# Patient Record
Sex: Male | Born: 1970 | Race: White | Hispanic: No | Marital: Single | Smoking: Former smoker
Health system: Southern US, Community
[De-identification: ages and names within clinical notes are randomized; demographics above are authoritative.]

## PROBLEM LIST (undated history)

## (undated) ENCOUNTER — Emergency Department (HOSPITAL_COMMUNITY): Admission: EM | Disposition: A | Discharge status: Home / Self Care | Payer: Medicare Other

## (undated) DIAGNOSIS — F411 Generalized anxiety disorder: Secondary | ICD-10-CM

## (undated) DIAGNOSIS — E785 Hyperlipidemia, unspecified: Secondary | ICD-10-CM

## (undated) DIAGNOSIS — Z981 Arthrodesis status: Secondary | ICD-10-CM

## (undated) DIAGNOSIS — M7550 Bursitis of unspecified shoulder: Secondary | ICD-10-CM

## (undated) DIAGNOSIS — R0782 Intercostal pain: Secondary | ICD-10-CM

## (undated) DIAGNOSIS — D492 Neoplasm of unspecified behavior of bone, soft tissue, and skin: Secondary | ICD-10-CM

## (undated) DIAGNOSIS — Z967 Presence of other bone and tendon implants: Secondary | ICD-10-CM

## (undated) DIAGNOSIS — J45909 Unspecified asthma, uncomplicated: Secondary | ICD-10-CM

## (undated) DIAGNOSIS — J309 Allergic rhinitis, unspecified: Secondary | ICD-10-CM

## (undated) DIAGNOSIS — M1990 Unspecified osteoarthritis, unspecified site: Secondary | ICD-10-CM

## (undated) DIAGNOSIS — M5440 Lumbago with sciatica, unspecified side: Secondary | ICD-10-CM

## (undated) DIAGNOSIS — M542 Cervicalgia: Secondary | ICD-10-CM

## (undated) DIAGNOSIS — G5600 Carpal tunnel syndrome, unspecified upper limb: Secondary | ICD-10-CM

## (undated) DIAGNOSIS — G2581 Restless legs syndrome: Secondary | ICD-10-CM

## (undated) DIAGNOSIS — M545 Low back pain, unspecified: Secondary | ICD-10-CM

## (undated) DIAGNOSIS — G894 Chronic pain syndrome: Secondary | ICD-10-CM

## (undated) DIAGNOSIS — K644 Residual hemorrhoidal skin tags: Secondary | ICD-10-CM

## (undated) DIAGNOSIS — R21 Rash and other nonspecific skin eruption: Secondary | ICD-10-CM

## (undated) DIAGNOSIS — J321 Chronic frontal sinusitis: Secondary | ICD-10-CM

## (undated) DIAGNOSIS — R930 Abnormal findings on diagnostic imaging of skull and head, not elsewhere classified: Secondary | ICD-10-CM

## (undated) DIAGNOSIS — R569 Unspecified convulsions: Secondary | ICD-10-CM

## (undated) DIAGNOSIS — M533 Sacrococcygeal disorders, not elsewhere classified: Secondary | ICD-10-CM

## (undated) DIAGNOSIS — G8929 Other chronic pain: Secondary | ICD-10-CM

## (undated) DIAGNOSIS — G4700 Insomnia, unspecified: Secondary | ICD-10-CM

## (undated) DIAGNOSIS — S06369A Traumatic hemorrhage of cerebrum, unspecified, with loss of consciousness of unspecified duration, initial encounter: Secondary | ICD-10-CM

## (undated) DIAGNOSIS — M25519 Pain in unspecified shoulder: Secondary | ICD-10-CM

## (undated) DIAGNOSIS — G5762 Lesion of plantar nerve, left lower limb: Secondary | ICD-10-CM

## (undated) DIAGNOSIS — M8580 Other specified disorders of bone density and structure, unspecified site: Secondary | ICD-10-CM

## (undated) DIAGNOSIS — G43809 Other migraine, not intractable, without status migrainosus: Secondary | ICD-10-CM

## (undated) DIAGNOSIS — K219 Gastro-esophageal reflux disease without esophagitis: Secondary | ICD-10-CM

## (undated) DIAGNOSIS — S8992XA Unspecified injury of left lower leg, initial encounter: Secondary | ICD-10-CM

## (undated) DIAGNOSIS — F419 Anxiety disorder, unspecified: Secondary | ICD-10-CM

## (undated) DIAGNOSIS — G252 Other specified forms of tremor: Secondary | ICD-10-CM

## (undated) DIAGNOSIS — M5432 Sciatica, left side: Secondary | ICD-10-CM

## (undated) DIAGNOSIS — G5622 Lesion of ulnar nerve, left upper limb: Secondary | ICD-10-CM

## (undated) DIAGNOSIS — R51 Headache: Secondary | ICD-10-CM

## (undated) HISTORY — DX: Gastro-esophageal reflux disease without esophagitis: K21.9

## (undated) HISTORY — DX: Other chronic pain: G89.29

## (undated) HISTORY — DX: Allergic rhinitis, unspecified: J30.9

## (undated) HISTORY — DX: Unspecified convulsions: R56.9

## (undated) HISTORY — DX: Chronic frontal sinusitis: J32.1

## (undated) HISTORY — DX: Chronic pain syndrome: G89.4

## (undated) HISTORY — DX: Cervicalgia: M54.2

## (undated) HISTORY — DX: Rash and other nonspecific skin eruption: R21

## (undated) HISTORY — DX: Bursitis of unspecified shoulder: M75.50

## (undated) HISTORY — DX: Other migraine, not intractable, without status migrainosus: G43.809

## (undated) HISTORY — DX: Unspecified injury of left lower leg, initial encounter: S89.92XA

## (undated) HISTORY — DX: Unspecified asthma, uncomplicated: J45.909

## (undated) HISTORY — DX: Restless legs syndrome: G25.81

## (undated) HISTORY — DX: Abnormal findings on diagnostic imaging of skull and head, not elsewhere classified: R93.0

## (undated) HISTORY — PX: TONSILLECTOMY: SUR1361

## (undated) HISTORY — DX: Low back pain, unspecified: M54.50

## (undated) HISTORY — DX: Traumatic hemorrhage of cerebrum, unspecified, with loss of consciousness of unspecified duration, initial encounter: S06.369A

## (undated) HISTORY — DX: Neoplasm of unspecified behavior of bone, soft tissue, and skin: D49.2

## (undated) HISTORY — DX: Lumbago with sciatica, unspecified side: M54.40

## (undated) HISTORY — DX: Residual hemorrhoidal skin tags: K64.4

## (undated) HISTORY — DX: Intercostal pain: R07.82

## (undated) HISTORY — DX: Sciatica, left side: M54.32

## (undated) HISTORY — DX: Lesion of ulnar nerve, left upper limb: G56.22

## (undated) HISTORY — DX: Unspecified osteoarthritis, unspecified site: M19.90

## (undated) HISTORY — PX: SHOULDER ARTHROSCOPY: SHX128

## (undated) HISTORY — DX: Hyperlipidemia, unspecified: E78.5

## (undated) HISTORY — DX: Other specified forms of tremor: G25.2

## (undated) HISTORY — DX: Generalized anxiety disorder: F41.1

## (undated) HISTORY — DX: Insomnia, unspecified: G47.00

## (undated) HISTORY — DX: Headache: R51

## (undated) HISTORY — DX: Carpal tunnel syndrome, unspecified upper limb: G56.00

## (undated) HISTORY — DX: Pain in unspecified shoulder: M25.519

## (undated) HISTORY — DX: Lesion of plantar nerve, left lower limb: G57.62

## (undated) HISTORY — PX: CRANIOTOMY: SHX93

## (undated) HISTORY — DX: Sacrococcygeal disorders, not elsewhere classified: M53.3

## (undated) HISTORY — DX: Low back pain: M54.5

## (undated) HISTORY — DX: Other specified disorders of bone density and structure, unspecified site: M85.80

## (undated) HISTORY — DX: Presence of other bone and tendon implants: Z96.7

## (undated) HISTORY — DX: Arthrodesis status: Z98.1

---
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
## 1995-09-17 HISTORY — PX: NASAL FRACTURE SURGERY: SHX718

## 1998-01-02 ENCOUNTER — Emergency Department (HOSPITAL_COMMUNITY): Admission: EM | Admit: 1998-01-02 | Discharge: 1998-01-02 | Payer: Self-pay | Admitting: Emergency Medicine

## 1998-05-24 ENCOUNTER — Encounter: Admission: RE | Admit: 1998-05-24 | Discharge: 1998-05-24 | Payer: Self-pay | Admitting: Family Medicine

## 1998-08-02 ENCOUNTER — Encounter: Admission: RE | Admit: 1998-08-02 | Discharge: 1998-08-02 | Payer: Self-pay | Admitting: Family Medicine

## 1998-08-29 ENCOUNTER — Encounter: Admission: RE | Admit: 1998-08-29 | Discharge: 1998-08-29 | Payer: Self-pay | Admitting: Sports Medicine

## 1998-09-01 ENCOUNTER — Emergency Department (HOSPITAL_COMMUNITY): Admission: EM | Admit: 1998-09-01 | Discharge: 1998-09-01 | Payer: Self-pay | Admitting: Emergency Medicine

## 1998-09-04 ENCOUNTER — Encounter: Admission: RE | Admit: 1998-09-04 | Discharge: 1998-09-04 | Payer: Self-pay | Admitting: Sports Medicine

## 1998-09-05 ENCOUNTER — Encounter: Admission: RE | Admit: 1998-09-05 | Discharge: 1998-09-05 | Payer: Self-pay | Admitting: Sports Medicine

## 1998-09-07 ENCOUNTER — Encounter: Admission: RE | Admit: 1998-09-07 | Discharge: 1998-09-07 | Payer: Self-pay | Admitting: Sports Medicine

## 1998-09-12 ENCOUNTER — Encounter: Admission: RE | Admit: 1998-09-12 | Discharge: 1998-09-12 | Payer: Self-pay | Admitting: Family Medicine

## 1998-10-04 ENCOUNTER — Ambulatory Visit (HOSPITAL_COMMUNITY): Admission: RE | Admit: 1998-10-04 | Discharge: 1998-10-04 | Payer: Self-pay | Admitting: Neurosurgery

## 1998-10-04 ENCOUNTER — Encounter: Payer: Self-pay | Admitting: Neurosurgery

## 1998-10-05 ENCOUNTER — Encounter: Admission: RE | Admit: 1998-10-05 | Discharge: 1998-10-05 | Payer: Self-pay | Admitting: Family Medicine

## 1998-10-18 ENCOUNTER — Ambulatory Visit (HOSPITAL_COMMUNITY): Admission: RE | Admit: 1998-10-18 | Discharge: 1998-10-18 | Payer: Self-pay | Admitting: Neurosurgery

## 1998-10-18 ENCOUNTER — Encounter: Payer: Self-pay | Admitting: Neurosurgery

## 1998-11-02 ENCOUNTER — Encounter: Payer: Self-pay | Admitting: Neurosurgery

## 1998-11-02 ENCOUNTER — Ambulatory Visit (HOSPITAL_COMMUNITY): Admission: RE | Admit: 1998-11-02 | Discharge: 1998-11-02 | Payer: Self-pay | Admitting: Neurosurgery

## 1998-11-17 ENCOUNTER — Encounter: Admission: RE | Admit: 1998-11-17 | Discharge: 1998-11-28 | Payer: Self-pay | Admitting: Neurosurgery

## 1999-02-12 ENCOUNTER — Encounter: Payer: Self-pay | Admitting: Emergency Medicine

## 1999-02-12 ENCOUNTER — Emergency Department (HOSPITAL_COMMUNITY): Admission: EM | Admit: 1999-02-12 | Discharge: 1999-02-12 | Payer: Self-pay | Admitting: Emergency Medicine

## 1999-02-20 ENCOUNTER — Encounter: Admission: RE | Admit: 1999-02-20 | Discharge: 1999-02-20 | Payer: Self-pay | Admitting: Family Medicine

## 1999-03-06 ENCOUNTER — Encounter: Admission: RE | Admit: 1999-03-06 | Discharge: 1999-03-06 | Payer: Self-pay | Admitting: Family Medicine

## 1999-04-10 ENCOUNTER — Encounter: Admission: RE | Admit: 1999-04-10 | Discharge: 1999-04-10 | Payer: Self-pay | Admitting: Family Medicine

## 1999-04-20 ENCOUNTER — Encounter: Admission: RE | Admit: 1999-04-20 | Discharge: 1999-04-20 | Payer: Self-pay | Admitting: Family Medicine

## 1999-05-08 ENCOUNTER — Encounter: Admission: RE | Admit: 1999-05-08 | Discharge: 1999-05-08 | Payer: Self-pay | Admitting: Family Medicine

## 1999-05-15 ENCOUNTER — Encounter: Admission: RE | Admit: 1999-05-15 | Discharge: 1999-05-15 | Payer: Self-pay | Admitting: Sports Medicine

## 1999-05-22 ENCOUNTER — Encounter: Admission: RE | Admit: 1999-05-22 | Discharge: 1999-05-22 | Payer: Self-pay | Admitting: Family Medicine

## 1999-06-12 ENCOUNTER — Encounter: Admission: RE | Admit: 1999-06-12 | Discharge: 1999-06-12 | Payer: Self-pay | Admitting: Family Medicine

## 1999-06-29 ENCOUNTER — Encounter: Admission: RE | Admit: 1999-06-29 | Discharge: 1999-06-29 | Payer: Self-pay | Admitting: Family Medicine

## 1999-07-05 ENCOUNTER — Encounter: Admission: RE | Admit: 1999-07-05 | Discharge: 1999-10-03 | Payer: Self-pay | Admitting: Family Medicine

## 1999-07-13 ENCOUNTER — Ambulatory Visit (HOSPITAL_COMMUNITY): Admission: RE | Admit: 1999-07-13 | Discharge: 1999-07-13 | Payer: Self-pay | Admitting: Neurology

## 1999-07-30 ENCOUNTER — Ambulatory Visit (HOSPITAL_COMMUNITY): Admission: RE | Admit: 1999-07-30 | Discharge: 1999-07-30 | Payer: Self-pay | Admitting: Neurology

## 1999-07-30 ENCOUNTER — Encounter: Payer: Self-pay | Admitting: Neurology

## 1999-09-17 HISTORY — PX: UVULOPALATOPHARYNGOPLASTY (UPPP)/TONSILLECTOMY/SEPTOPLASTY: SHX6164

## 1999-10-03 ENCOUNTER — Encounter: Admission: RE | Admit: 1999-10-03 | Discharge: 1999-10-03 | Payer: Self-pay | Admitting: Family Medicine

## 1999-10-27 ENCOUNTER — Ambulatory Visit: Admission: RE | Admit: 1999-10-27 | Discharge: 1999-10-27 | Payer: Self-pay | Admitting: Otolaryngology

## 1999-10-31 ENCOUNTER — Encounter: Admission: RE | Admit: 1999-10-31 | Discharge: 1999-10-31 | Payer: Self-pay | Admitting: Family Medicine

## 2000-02-01 ENCOUNTER — Encounter (INDEPENDENT_AMBULATORY_CARE_PROVIDER_SITE_OTHER): Payer: Self-pay | Admitting: Specialist

## 2000-02-01 ENCOUNTER — Inpatient Hospital Stay (HOSPITAL_COMMUNITY): Admission: RE | Admit: 2000-02-01 | Discharge: 2000-02-03 | Payer: Self-pay | Admitting: Otolaryngology

## 2000-03-18 ENCOUNTER — Encounter: Admission: RE | Admit: 2000-03-18 | Discharge: 2000-03-18 | Payer: Self-pay | Admitting: Family Medicine

## 2000-03-21 ENCOUNTER — Encounter: Admission: RE | Admit: 2000-03-21 | Discharge: 2000-03-21 | Payer: Self-pay | Admitting: Family Medicine

## 2000-04-01 ENCOUNTER — Encounter: Admission: RE | Admit: 2000-04-01 | Discharge: 2000-04-01 | Payer: Self-pay | Admitting: Sports Medicine

## 2000-04-22 ENCOUNTER — Encounter: Admission: RE | Admit: 2000-04-22 | Discharge: 2000-04-22 | Payer: Self-pay | Admitting: Family Medicine

## 2000-05-20 ENCOUNTER — Encounter: Admission: RE | Admit: 2000-05-20 | Discharge: 2000-05-20 | Payer: Self-pay | Admitting: Family Medicine

## 2000-05-27 ENCOUNTER — Encounter: Admission: RE | Admit: 2000-05-27 | Discharge: 2000-05-27 | Payer: Self-pay | Admitting: Family Medicine

## 2000-06-03 ENCOUNTER — Encounter: Admission: RE | Admit: 2000-06-03 | Discharge: 2000-06-03 | Payer: Self-pay | Admitting: Family Medicine

## 2000-06-17 ENCOUNTER — Encounter: Admission: RE | Admit: 2000-06-17 | Discharge: 2000-06-17 | Payer: Self-pay | Admitting: Family Medicine

## 2000-07-08 ENCOUNTER — Encounter: Admission: RE | Admit: 2000-07-08 | Discharge: 2000-07-08 | Payer: Self-pay | Admitting: Family Medicine

## 2000-07-16 ENCOUNTER — Encounter: Admission: RE | Admit: 2000-07-16 | Discharge: 2000-07-16 | Payer: Self-pay | Admitting: Family Medicine

## 2000-07-30 ENCOUNTER — Encounter: Admission: RE | Admit: 2000-07-30 | Discharge: 2000-07-30 | Payer: Self-pay | Admitting: Family Medicine

## 2001-02-20 ENCOUNTER — Emergency Department (HOSPITAL_COMMUNITY): Admission: EM | Admit: 2001-02-20 | Discharge: 2001-02-20 | Payer: Self-pay | Admitting: Emergency Medicine

## 2001-02-20 ENCOUNTER — Encounter: Payer: Self-pay | Admitting: Emergency Medicine

## 2001-02-23 ENCOUNTER — Encounter: Admission: RE | Admit: 2001-02-23 | Discharge: 2001-02-23 | Payer: Self-pay | Admitting: *Deleted

## 2001-03-24 ENCOUNTER — Encounter: Admission: RE | Admit: 2001-03-24 | Discharge: 2001-03-24 | Payer: Self-pay | Admitting: Family Medicine

## 2001-05-12 ENCOUNTER — Encounter: Admission: RE | Admit: 2001-05-12 | Discharge: 2001-05-12 | Payer: Self-pay | Admitting: Family Medicine

## 2001-05-24 ENCOUNTER — Emergency Department (HOSPITAL_COMMUNITY): Admission: EM | Admit: 2001-05-24 | Discharge: 2001-05-24 | Payer: Self-pay

## 2001-05-24 ENCOUNTER — Encounter: Payer: Self-pay | Admitting: Emergency Medicine

## 2001-06-23 ENCOUNTER — Encounter: Admission: RE | Admit: 2001-06-23 | Discharge: 2001-06-23 | Payer: Self-pay | Admitting: Family Medicine

## 2002-03-02 ENCOUNTER — Encounter: Admission: RE | Admit: 2002-03-02 | Discharge: 2002-03-02 | Payer: Self-pay | Admitting: Sports Medicine

## 2002-03-02 ENCOUNTER — Encounter: Payer: Self-pay | Admitting: Sports Medicine

## 2002-03-02 ENCOUNTER — Encounter: Admission: RE | Admit: 2002-03-02 | Discharge: 2002-03-02 | Payer: Self-pay | Admitting: Family Medicine

## 2002-03-08 ENCOUNTER — Encounter: Admission: RE | Admit: 2002-03-08 | Discharge: 2002-03-22 | Payer: Self-pay | Admitting: Sports Medicine

## 2002-03-09 ENCOUNTER — Emergency Department (HOSPITAL_COMMUNITY): Admission: EM | Admit: 2002-03-09 | Discharge: 2002-03-09 | Payer: Self-pay | Admitting: Emergency Medicine

## 2002-03-09 ENCOUNTER — Encounter: Payer: Self-pay | Admitting: Emergency Medicine

## 2002-03-10 ENCOUNTER — Encounter: Admission: RE | Admit: 2002-03-10 | Discharge: 2002-03-10 | Payer: Self-pay | Admitting: Family Medicine

## 2002-03-23 ENCOUNTER — Encounter: Admission: RE | Admit: 2002-03-23 | Discharge: 2002-03-23 | Payer: Self-pay | Admitting: Family Medicine

## 2002-03-23 ENCOUNTER — Ambulatory Visit (HOSPITAL_COMMUNITY): Admission: RE | Admit: 2002-03-23 | Discharge: 2002-03-23 | Payer: Self-pay | Admitting: Family Medicine

## 2002-03-30 ENCOUNTER — Encounter: Admission: RE | Admit: 2002-03-30 | Discharge: 2002-03-30 | Payer: Self-pay | Admitting: Family Medicine

## 2002-04-13 ENCOUNTER — Encounter: Admission: RE | Admit: 2002-04-13 | Discharge: 2002-04-13 | Payer: Self-pay | Admitting: Family Medicine

## 2002-04-15 ENCOUNTER — Encounter: Payer: Self-pay | Admitting: Family Medicine

## 2002-04-15 ENCOUNTER — Encounter: Admission: RE | Admit: 2002-04-15 | Discharge: 2002-04-15 | Payer: Self-pay | Admitting: Family Medicine

## 2002-04-27 ENCOUNTER — Encounter: Admission: RE | Admit: 2002-04-27 | Discharge: 2002-04-27 | Payer: Self-pay | Admitting: Family Medicine

## 2002-05-19 ENCOUNTER — Encounter: Admission: RE | Admit: 2002-05-19 | Discharge: 2002-06-15 | Payer: Self-pay

## 2002-06-18 ENCOUNTER — Encounter: Admission: RE | Admit: 2002-06-18 | Discharge: 2002-09-16 | Payer: Self-pay

## 2002-07-23 ENCOUNTER — Encounter: Admission: RE | Admit: 2002-07-23 | Discharge: 2002-07-23 | Payer: Self-pay | Admitting: Family Medicine

## 2002-08-31 ENCOUNTER — Encounter: Admission: RE | Admit: 2002-08-31 | Discharge: 2002-08-31 | Payer: Self-pay | Admitting: Family Medicine

## 2002-09-21 ENCOUNTER — Encounter: Admission: RE | Admit: 2002-09-21 | Discharge: 2002-09-21 | Payer: Self-pay | Admitting: Family Medicine

## 2002-09-21 ENCOUNTER — Encounter: Admission: RE | Admit: 2002-09-21 | Discharge: 2002-12-20 | Payer: Self-pay

## 2002-10-26 ENCOUNTER — Encounter: Admission: RE | Admit: 2002-10-26 | Discharge: 2002-11-08 | Payer: Self-pay | Admitting: Neurosurgery

## 2003-01-14 ENCOUNTER — Encounter: Admission: RE | Admit: 2003-01-14 | Discharge: 2003-04-14 | Payer: Self-pay | Admitting: Family Medicine

## 2003-01-27 ENCOUNTER — Encounter: Payer: Self-pay | Admitting: Physical Medicine & Rehabilitation

## 2003-01-27 ENCOUNTER — Ambulatory Visit (HOSPITAL_COMMUNITY)
Admission: RE | Admit: 2003-01-27 | Discharge: 2003-01-27 | Payer: Self-pay | Admitting: Physical Medicine & Rehabilitation

## 2003-03-07 ENCOUNTER — Encounter: Admission: RE | Admit: 2003-03-07 | Discharge: 2003-03-07 | Payer: Self-pay | Admitting: Family Medicine

## 2003-03-15 ENCOUNTER — Encounter: Admission: RE | Admit: 2003-03-15 | Discharge: 2003-03-15 | Payer: Self-pay | Admitting: Family Medicine

## 2003-04-21 ENCOUNTER — Encounter: Admission: RE | Admit: 2003-04-21 | Discharge: 2003-04-21 | Payer: Self-pay | Admitting: Family Medicine

## 2003-04-26 ENCOUNTER — Encounter: Admission: RE | Admit: 2003-04-26 | Discharge: 2003-04-26 | Payer: Self-pay | Admitting: Family Medicine

## 2003-05-10 ENCOUNTER — Encounter
Admission: RE | Admit: 2003-05-10 | Discharge: 2003-08-08 | Payer: Self-pay | Admitting: Physical Medicine & Rehabilitation

## 2003-07-14 ENCOUNTER — Encounter: Admission: RE | Admit: 2003-07-14 | Discharge: 2003-07-14 | Payer: Self-pay | Admitting: Family Medicine

## 2003-09-21 ENCOUNTER — Encounter: Admission: RE | Admit: 2003-09-21 | Discharge: 2003-12-20 | Payer: Self-pay | Admitting: Family Medicine

## 2003-09-27 ENCOUNTER — Encounter: Admission: RE | Admit: 2003-09-27 | Discharge: 2003-09-27 | Payer: Self-pay | Admitting: Family Medicine

## 2003-10-06 ENCOUNTER — Encounter: Admission: RE | Admit: 2003-10-06 | Discharge: 2003-10-06 | Payer: Self-pay | Admitting: Family Medicine

## 2003-10-18 ENCOUNTER — Ambulatory Visit (HOSPITAL_COMMUNITY)
Admission: RE | Admit: 2003-10-18 | Discharge: 2003-10-18 | Payer: Self-pay | Admitting: Physical Medicine & Rehabilitation

## 2003-10-19 ENCOUNTER — Encounter: Admission: RE | Admit: 2003-10-19 | Discharge: 2003-10-19 | Payer: Self-pay | Admitting: Family Medicine

## 2003-11-08 ENCOUNTER — Encounter: Admission: RE | Admit: 2003-11-08 | Discharge: 2003-11-08 | Payer: Self-pay | Admitting: Family Medicine

## 2003-11-29 ENCOUNTER — Encounter: Admission: RE | Admit: 2003-11-29 | Discharge: 2003-11-29 | Payer: Self-pay | Admitting: Family Medicine

## 2003-12-13 ENCOUNTER — Encounter: Admission: RE | Admit: 2003-12-13 | Discharge: 2003-12-13 | Payer: Self-pay | Admitting: Family Medicine

## 2003-12-27 ENCOUNTER — Encounter: Admission: RE | Admit: 2003-12-27 | Discharge: 2003-12-27 | Payer: Self-pay | Admitting: Family Medicine

## 2004-01-24 ENCOUNTER — Encounter: Admission: RE | Admit: 2004-01-24 | Discharge: 2004-01-24 | Payer: Self-pay | Admitting: Family Medicine

## 2004-02-16 ENCOUNTER — Encounter
Admission: RE | Admit: 2004-02-16 | Discharge: 2004-05-16 | Payer: Self-pay | Admitting: Physical Medicine & Rehabilitation

## 2004-02-28 ENCOUNTER — Encounter: Admission: RE | Admit: 2004-02-28 | Discharge: 2004-02-28 | Payer: Self-pay | Admitting: Family Medicine

## 2004-04-04 ENCOUNTER — Encounter
Admission: RE | Admit: 2004-04-04 | Discharge: 2004-07-03 | Payer: Self-pay | Admitting: Physical Medicine & Rehabilitation

## 2004-04-30 ENCOUNTER — Emergency Department (HOSPITAL_COMMUNITY): Admission: EM | Admit: 2004-04-30 | Discharge: 2004-04-30 | Payer: Self-pay | Admitting: Family Medicine

## 2004-05-03 ENCOUNTER — Emergency Department (HOSPITAL_COMMUNITY): Admission: EM | Admit: 2004-05-03 | Discharge: 2004-05-03 | Payer: Self-pay | Admitting: Emergency Medicine

## 2004-05-07 ENCOUNTER — Encounter: Admission: RE | Admit: 2004-05-07 | Discharge: 2004-05-07 | Payer: Self-pay | Admitting: Family Medicine

## 2004-05-25 ENCOUNTER — Inpatient Hospital Stay (HOSPITAL_COMMUNITY): Admission: AD | Admit: 2004-05-25 | Discharge: 2004-05-27 | Payer: Self-pay | Admitting: Sports Medicine

## 2004-05-25 ENCOUNTER — Ambulatory Visit: Payer: Self-pay | Admitting: Family Medicine

## 2004-05-29 ENCOUNTER — Ambulatory Visit: Payer: Self-pay | Admitting: Family Medicine

## 2004-05-31 ENCOUNTER — Encounter
Admission: RE | Admit: 2004-05-31 | Discharge: 2004-08-29 | Payer: Self-pay | Admitting: Physical Medicine & Rehabilitation

## 2004-06-01 ENCOUNTER — Encounter: Admission: RE | Admit: 2004-06-01 | Discharge: 2004-06-01 | Payer: Self-pay | Admitting: Gastroenterology

## 2004-06-01 ENCOUNTER — Ambulatory Visit: Payer: Self-pay | Admitting: Physical Medicine & Rehabilitation

## 2004-06-05 ENCOUNTER — Ambulatory Visit: Payer: Self-pay | Admitting: Family Medicine

## 2004-06-19 ENCOUNTER — Ambulatory Visit: Payer: Self-pay | Admitting: Family Medicine

## 2004-07-17 ENCOUNTER — Ambulatory Visit: Payer: Self-pay | Admitting: Family Medicine

## 2004-07-18 ENCOUNTER — Ambulatory Visit: Payer: Self-pay | Admitting: Physical Medicine & Rehabilitation

## 2004-08-12 ENCOUNTER — Emergency Department (HOSPITAL_COMMUNITY): Admission: EM | Admit: 2004-08-12 | Discharge: 2004-08-12 | Payer: Self-pay | Admitting: Family Medicine

## 2004-08-28 ENCOUNTER — Ambulatory Visit: Payer: Self-pay | Admitting: Family Medicine

## 2004-10-02 ENCOUNTER — Ambulatory Visit: Payer: Self-pay | Admitting: Family Medicine

## 2004-10-02 ENCOUNTER — Ambulatory Visit (HOSPITAL_COMMUNITY): Admission: RE | Admit: 2004-10-02 | Discharge: 2004-10-02 | Payer: Self-pay | Admitting: Family Medicine

## 2004-10-22 ENCOUNTER — Emergency Department (HOSPITAL_COMMUNITY): Admission: EM | Admit: 2004-10-22 | Discharge: 2004-10-22 | Payer: Self-pay | Admitting: Family Medicine

## 2004-10-31 ENCOUNTER — Ambulatory Visit (HOSPITAL_COMMUNITY): Admission: RE | Admit: 2004-10-31 | Discharge: 2004-10-31 | Payer: Self-pay | Admitting: Sports Medicine

## 2004-10-31 ENCOUNTER — Ambulatory Visit: Payer: Self-pay | Admitting: Sports Medicine

## 2004-11-14 ENCOUNTER — Encounter
Admission: RE | Admit: 2004-11-14 | Discharge: 2005-02-12 | Payer: Self-pay | Admitting: Physical Medicine & Rehabilitation

## 2004-11-16 ENCOUNTER — Ambulatory Visit: Payer: Self-pay | Admitting: Physical Medicine & Rehabilitation

## 2004-12-13 ENCOUNTER — Emergency Department (HOSPITAL_COMMUNITY): Admission: EM | Admit: 2004-12-13 | Discharge: 2004-12-14 | Payer: Self-pay | Admitting: Emergency Medicine

## 2004-12-17 ENCOUNTER — Emergency Department (HOSPITAL_COMMUNITY): Admission: EM | Admit: 2004-12-17 | Discharge: 2004-12-17 | Payer: Self-pay | Admitting: Family Medicine

## 2005-01-01 ENCOUNTER — Ambulatory Visit: Payer: Self-pay

## 2005-01-01 ENCOUNTER — Encounter: Admission: RE | Admit: 2005-01-01 | Discharge: 2005-01-01 | Payer: Self-pay | Admitting: Family Medicine

## 2005-01-15 ENCOUNTER — Ambulatory Visit: Payer: Self-pay | Admitting: Physical Medicine & Rehabilitation

## 2005-01-15 ENCOUNTER — Ambulatory Visit: Payer: Self-pay | Admitting: Family Medicine

## 2005-02-12 ENCOUNTER — Ambulatory Visit: Payer: Self-pay | Admitting: Family Medicine

## 2005-02-13 ENCOUNTER — Encounter
Admission: RE | Admit: 2005-02-13 | Discharge: 2005-05-14 | Payer: Self-pay | Admitting: Physical Medicine & Rehabilitation

## 2005-03-12 ENCOUNTER — Ambulatory Visit: Payer: Self-pay | Admitting: Physical Medicine & Rehabilitation

## 2005-04-12 ENCOUNTER — Ambulatory Visit: Payer: Self-pay | Admitting: Family Medicine

## 2005-04-30 ENCOUNTER — Ambulatory Visit: Payer: Self-pay | Admitting: Family Medicine

## 2005-05-07 ENCOUNTER — Ambulatory Visit: Payer: Self-pay | Admitting: Physical Medicine & Rehabilitation

## 2005-06-04 ENCOUNTER — Encounter
Admission: RE | Admit: 2005-06-04 | Discharge: 2005-09-02 | Payer: Self-pay | Admitting: Physical Medicine & Rehabilitation

## 2005-06-07 ENCOUNTER — Ambulatory Visit: Payer: Self-pay | Admitting: Physical Medicine & Rehabilitation

## 2005-07-09 ENCOUNTER — Ambulatory Visit: Payer: Self-pay | Admitting: Physical Medicine & Rehabilitation

## 2005-07-23 ENCOUNTER — Ambulatory Visit: Payer: Self-pay | Admitting: Family Medicine

## 2005-08-31 ENCOUNTER — Ambulatory Visit: Payer: Self-pay | Admitting: Physical Medicine & Rehabilitation

## 2005-08-31 ENCOUNTER — Encounter
Admission: RE | Admit: 2005-08-31 | Discharge: 2005-11-29 | Payer: Self-pay | Admitting: Physical Medicine & Rehabilitation

## 2005-10-04 ENCOUNTER — Ambulatory Visit: Payer: Self-pay | Admitting: Physical Medicine & Rehabilitation

## 2005-11-01 ENCOUNTER — Ambulatory Visit: Payer: Self-pay | Admitting: Physical Medicine & Rehabilitation

## 2005-11-12 ENCOUNTER — Ambulatory Visit: Payer: Self-pay | Admitting: Family Medicine

## 2005-11-15 ENCOUNTER — Ambulatory Visit: Payer: Self-pay | Admitting: Physical Medicine & Rehabilitation

## 2005-12-19 ENCOUNTER — Ambulatory Visit: Payer: Self-pay | Admitting: Physical Medicine & Rehabilitation

## 2005-12-19 ENCOUNTER — Encounter
Admission: RE | Admit: 2005-12-19 | Discharge: 2006-03-19 | Payer: Self-pay | Admitting: Physical Medicine & Rehabilitation

## 2006-01-28 ENCOUNTER — Ambulatory Visit: Payer: Self-pay | Admitting: Sports Medicine

## 2006-01-30 ENCOUNTER — Ambulatory Visit: Payer: Self-pay | Admitting: Physical Medicine & Rehabilitation

## 2006-02-11 ENCOUNTER — Ambulatory Visit: Payer: Self-pay | Admitting: Family Medicine

## 2006-02-13 ENCOUNTER — Ambulatory Visit: Payer: Self-pay | Admitting: Physical Medicine & Rehabilitation

## 2006-02-25 ENCOUNTER — Ambulatory Visit: Payer: Self-pay | Admitting: Family Medicine

## 2006-03-20 ENCOUNTER — Encounter
Admission: RE | Admit: 2006-03-20 | Discharge: 2006-06-18 | Payer: Self-pay | Admitting: Physical Medicine & Rehabilitation

## 2006-03-25 ENCOUNTER — Ambulatory Visit: Payer: Self-pay | Admitting: Family Medicine

## 2006-03-26 ENCOUNTER — Encounter: Admission: RE | Admit: 2006-03-26 | Discharge: 2006-03-26 | Payer: Self-pay | Admitting: Family Medicine

## 2006-04-21 ENCOUNTER — Ambulatory Visit: Payer: Self-pay | Admitting: Physical Medicine & Rehabilitation

## 2006-05-11 ENCOUNTER — Emergency Department (HOSPITAL_COMMUNITY): Admission: EM | Admit: 2006-05-11 | Discharge: 2006-05-11 | Payer: Self-pay | Admitting: Family Medicine

## 2006-05-22 ENCOUNTER — Ambulatory Visit: Payer: Self-pay | Admitting: Physical Medicine & Rehabilitation

## 2006-05-23 ENCOUNTER — Encounter
Admission: RE | Admit: 2006-05-23 | Discharge: 2006-08-21 | Payer: Self-pay | Admitting: Physical Medicine & Rehabilitation

## 2006-06-23 ENCOUNTER — Ambulatory Visit: Payer: Self-pay | Admitting: Family Medicine

## 2006-06-24 ENCOUNTER — Ambulatory Visit: Payer: Self-pay | Admitting: Family Medicine

## 2006-06-24 LAB — CONVERTED CEMR LAB
Cholesterol: 138 mg/dL
HDL: 34 mg/dL
LDL Cholesterol: 72 mg/dL
Total CHOL/HDL Ratio: 4.1
Triglycerides: 162 mg/dL
VLDL: 32 mg/dL

## 2006-07-17 ENCOUNTER — Ambulatory Visit: Payer: Self-pay | Admitting: Physical Medicine & Rehabilitation

## 2006-08-05 ENCOUNTER — Ambulatory Visit: Payer: Self-pay | Admitting: Family Medicine

## 2006-08-14 ENCOUNTER — Ambulatory Visit: Payer: Self-pay | Admitting: Physical Medicine & Rehabilitation

## 2006-09-19 ENCOUNTER — Encounter
Admission: RE | Admit: 2006-09-19 | Discharge: 2006-12-18 | Payer: Self-pay | Admitting: Physical Medicine & Rehabilitation

## 2006-10-20 ENCOUNTER — Ambulatory Visit: Payer: Self-pay | Admitting: Physical Medicine & Rehabilitation

## 2006-10-20 ENCOUNTER — Encounter
Admission: RE | Admit: 2006-10-20 | Discharge: 2007-01-18 | Payer: Self-pay | Admitting: Physical Medicine & Rehabilitation

## 2006-10-21 ENCOUNTER — Ambulatory Visit (HOSPITAL_COMMUNITY)
Admission: RE | Admit: 2006-10-21 | Discharge: 2006-10-21 | Payer: Self-pay | Admitting: Physical Medicine & Rehabilitation

## 2006-11-01 ENCOUNTER — Emergency Department (HOSPITAL_COMMUNITY): Admission: EM | Admit: 2006-11-01 | Discharge: 2006-11-01 | Payer: Self-pay | Admitting: Family Medicine

## 2006-11-10 ENCOUNTER — Ambulatory Visit: Payer: Self-pay | Admitting: Family Medicine

## 2006-11-13 DIAGNOSIS — R569 Unspecified convulsions: Secondary | ICD-10-CM | POA: Insufficient documentation

## 2006-11-13 DIAGNOSIS — J309 Allergic rhinitis, unspecified: Secondary | ICD-10-CM | POA: Insufficient documentation

## 2006-11-13 DIAGNOSIS — G40909 Epilepsy, unspecified, not intractable, without status epilepticus: Secondary | ICD-10-CM | POA: Insufficient documentation

## 2006-11-13 DIAGNOSIS — M545 Low back pain, unspecified: Secondary | ICD-10-CM | POA: Insufficient documentation

## 2006-11-13 DIAGNOSIS — E785 Hyperlipidemia, unspecified: Secondary | ICD-10-CM

## 2006-11-13 DIAGNOSIS — F17200 Nicotine dependence, unspecified, uncomplicated: Secondary | ICD-10-CM | POA: Insufficient documentation

## 2006-11-13 DIAGNOSIS — K219 Gastro-esophageal reflux disease without esophagitis: Secondary | ICD-10-CM

## 2006-11-13 DIAGNOSIS — M5440 Lumbago with sciatica, unspecified side: Secondary | ICD-10-CM

## 2006-11-13 HISTORY — DX: Unspecified convulsions: R56.9

## 2006-11-13 HISTORY — DX: Gastro-esophageal reflux disease without esophagitis: K21.9

## 2006-11-13 HISTORY — DX: Hyperlipidemia, unspecified: E78.5

## 2006-11-13 HISTORY — DX: Lumbago with sciatica, unspecified side: M54.40

## 2006-11-13 HISTORY — DX: Allergic rhinitis, unspecified: J30.9

## 2006-12-15 ENCOUNTER — Ambulatory Visit: Payer: Self-pay | Admitting: Physical Medicine & Rehabilitation

## 2007-02-06 ENCOUNTER — Ambulatory Visit: Payer: Self-pay | Admitting: Physical Medicine & Rehabilitation

## 2007-02-06 ENCOUNTER — Encounter
Admission: RE | Admit: 2007-02-06 | Discharge: 2007-05-07 | Payer: Self-pay | Admitting: Physical Medicine & Rehabilitation

## 2007-04-06 ENCOUNTER — Ambulatory Visit: Payer: Self-pay | Admitting: Physical Medicine & Rehabilitation

## 2007-04-14 ENCOUNTER — Ambulatory Visit: Payer: Self-pay | Admitting: Family Medicine

## 2007-04-14 LAB — CONVERTED CEMR LAB
ALT: 12 units/L (ref 0–53)
AST: 11 units/L (ref 0–37)
Albumin: 4.2 g/dL (ref 3.5–5.2)
Alkaline Phosphatase: 117 units/L (ref 39–117)
BUN: 7 mg/dL (ref 6–23)
CO2: 25 meq/L (ref 19–32)
Calcium: 8.9 mg/dL (ref 8.4–10.5)
Chloride: 103 meq/L (ref 96–112)
Creatinine, Ser: 0.68 mg/dL (ref 0.40–1.50)
Glucose, Bld: 82 mg/dL (ref 70–99)
HCT: 44.4 % (ref 39.0–52.0)
Hemoglobin: 14.3 g/dL (ref 13.0–17.0)
MCHC: 32.2 g/dL (ref 30.0–36.0)
MCV: 92.5 fL (ref 78.0–100.0)
Phenobarbital: 23.1 ug/mL (ref 15.0–40.0)
Platelets: 345 10*3/uL (ref 150–400)
Potassium: 4.1 meq/L (ref 3.5–5.3)
RBC: 4.8 M/uL (ref 4.22–5.81)
RDW: 14.3 % — ABNORMAL HIGH (ref 11.5–14.0)
Sodium: 141 meq/L (ref 135–145)
Total Bilirubin: 0.3 mg/dL (ref 0.3–1.2)
Total Protein: 6.8 g/dL (ref 6.0–8.3)
WBC: 11.1 10*3/uL — ABNORMAL HIGH (ref 4.0–10.5)

## 2007-04-15 ENCOUNTER — Encounter: Payer: Self-pay | Admitting: Family Medicine

## 2007-05-05 ENCOUNTER — Encounter
Admission: RE | Admit: 2007-05-05 | Discharge: 2007-06-16 | Payer: Self-pay | Admitting: Physical Medicine & Rehabilitation

## 2007-05-25 ENCOUNTER — Encounter
Admission: RE | Admit: 2007-05-25 | Discharge: 2007-08-23 | Payer: Self-pay | Admitting: Physical Medicine & Rehabilitation

## 2007-05-26 ENCOUNTER — Ambulatory Visit: Payer: Self-pay | Admitting: Physical Medicine & Rehabilitation

## 2007-06-22 ENCOUNTER — Ambulatory Visit: Payer: Self-pay | Admitting: Physical Medicine & Rehabilitation

## 2007-06-23 ENCOUNTER — Encounter
Admission: RE | Admit: 2007-06-23 | Discharge: 2007-06-23 | Payer: Self-pay | Admitting: Physical Medicine & Rehabilitation

## 2007-07-01 ENCOUNTER — Ambulatory Visit: Payer: Self-pay | Admitting: Physical Medicine & Rehabilitation

## 2007-07-01 ENCOUNTER — Ambulatory Visit: Payer: Self-pay | Admitting: Family Medicine

## 2007-07-18 ENCOUNTER — Emergency Department (HOSPITAL_COMMUNITY): Admission: EM | Admit: 2007-07-18 | Discharge: 2007-07-18 | Payer: Self-pay | Admitting: Emergency Medicine

## 2007-07-29 ENCOUNTER — Ambulatory Visit: Payer: Self-pay | Admitting: Physical Medicine & Rehabilitation

## 2007-08-26 ENCOUNTER — Encounter
Admission: RE | Admit: 2007-08-26 | Discharge: 2007-08-27 | Payer: Self-pay | Admitting: Physical Medicine & Rehabilitation

## 2007-09-28 ENCOUNTER — Encounter
Admission: RE | Admit: 2007-09-28 | Discharge: 2007-12-27 | Payer: Self-pay | Admitting: Physical Medicine & Rehabilitation

## 2007-09-28 ENCOUNTER — Ambulatory Visit: Payer: Self-pay | Admitting: Physical Medicine & Rehabilitation

## 2007-09-29 ENCOUNTER — Ambulatory Visit: Payer: Self-pay | Admitting: Family Medicine

## 2007-09-29 DIAGNOSIS — G2581 Restless legs syndrome: Secondary | ICD-10-CM

## 2007-09-29 HISTORY — DX: Restless legs syndrome: G25.81

## 2007-09-29 LAB — CONVERTED CEMR LAB
Cholesterol, target level: 200 mg/dL
HDL goal, serum: 40 mg/dL
LDL Goal: 130 mg/dL

## 2007-10-23 ENCOUNTER — Encounter: Admission: RE | Admit: 2007-10-23 | Discharge: 2007-10-26 | Payer: Self-pay | Admitting: Cardiovascular Disease

## 2007-10-23 ENCOUNTER — Ambulatory Visit: Payer: Self-pay | Admitting: Physical Medicine & Rehabilitation

## 2007-11-06 ENCOUNTER — Ambulatory Visit: Payer: Self-pay | Admitting: Family Medicine

## 2007-11-24 ENCOUNTER — Ambulatory Visit: Payer: Self-pay | Admitting: Physical Medicine & Rehabilitation

## 2007-12-17 ENCOUNTER — Ambulatory Visit: Payer: Self-pay | Admitting: Physical Medicine & Rehabilitation

## 2008-01-14 ENCOUNTER — Ambulatory Visit: Payer: Self-pay | Admitting: Physical Medicine & Rehabilitation

## 2008-01-14 ENCOUNTER — Encounter
Admission: RE | Admit: 2008-01-14 | Discharge: 2008-01-14 | Payer: Self-pay | Admitting: Physical Medicine & Rehabilitation

## 2008-02-12 ENCOUNTER — Encounter
Admission: RE | Admit: 2008-02-12 | Discharge: 2008-05-12 | Payer: Self-pay | Admitting: Physical Medicine & Rehabilitation

## 2008-02-15 ENCOUNTER — Ambulatory Visit: Payer: Self-pay | Admitting: Physical Medicine & Rehabilitation

## 2008-02-18 ENCOUNTER — Ambulatory Visit: Payer: Self-pay | Admitting: Family Medicine

## 2008-03-15 ENCOUNTER — Ambulatory Visit: Payer: Self-pay | Admitting: Family Medicine

## 2008-03-15 ENCOUNTER — Ambulatory Visit: Payer: Self-pay | Admitting: Physical Medicine & Rehabilitation

## 2008-03-22 ENCOUNTER — Encounter: Payer: Self-pay | Admitting: Family Medicine

## 2008-03-22 ENCOUNTER — Ambulatory Visit: Payer: Self-pay | Admitting: Family Medicine

## 2008-03-22 LAB — CONVERTED CEMR LAB
ALT: 12 units/L (ref 0–53)
AST: 13 units/L (ref 0–37)
Albumin: 4.5 g/dL (ref 3.5–5.2)
Alkaline Phosphatase: 104 units/L (ref 39–117)
BUN: 6 mg/dL (ref 6–23)
CO2: 24 meq/L (ref 19–32)
Calcium: 9.4 mg/dL (ref 8.4–10.5)
Chloride: 103 meq/L (ref 96–112)
Cholesterol: 159 mg/dL (ref 0–200)
Creatinine, Ser: 0.72 mg/dL (ref 0.40–1.50)
Glucose, Bld: 92 mg/dL (ref 70–99)
HCT: 49.2 % (ref 39.0–52.0)
HDL: 35 mg/dL — ABNORMAL LOW (ref >39)
Hemoglobin: 16 g/dL (ref 13.0–17.0)
LDL Cholesterol: 98 mg/dL (ref 0–99)
MCHC: 32.5 g/dL (ref 30.0–36.0)
MCV: 92.7 fL (ref 78.0–100.0)
Phenobarbital: 20.8 ug/mL (ref 15.0–40.0)
Platelets: 305 10*3/uL (ref 150–400)
Potassium: 4.4 meq/L (ref 3.5–5.3)
RBC: 5.31 M/uL (ref 4.22–5.81)
RDW: 14.2 % (ref 11.5–15.5)
Sodium: 138 meq/L (ref 135–145)
Total Bilirubin: 0.3 mg/dL (ref 0.3–1.2)
Total CHOL/HDL Ratio: 4.5
Total Protein: 7.2 g/dL (ref 6.0–8.3)
Triglycerides: 131 mg/dL (ref <150)
VLDL: 26 mg/dL (ref 0–40)
WBC: 12.6 10*3/uL — ABNORMAL HIGH (ref 4.0–10.5)

## 2008-03-23 ENCOUNTER — Encounter: Payer: Self-pay | Admitting: Family Medicine

## 2008-04-13 ENCOUNTER — Ambulatory Visit: Payer: Self-pay | Admitting: Physical Medicine & Rehabilitation

## 2008-04-15 ENCOUNTER — Encounter: Payer: Self-pay | Admitting: Family Medicine

## 2008-05-06 ENCOUNTER — Telehealth: Payer: Self-pay | Admitting: Family Medicine

## 2008-05-13 ENCOUNTER — Encounter
Admission: RE | Admit: 2008-05-13 | Discharge: 2008-08-11 | Payer: Self-pay | Admitting: Physical Medicine & Rehabilitation

## 2008-05-13 ENCOUNTER — Ambulatory Visit: Payer: Self-pay | Admitting: Physical Medicine & Rehabilitation

## 2008-06-08 ENCOUNTER — Ambulatory Visit: Payer: Self-pay | Admitting: Family Medicine

## 2008-06-10 ENCOUNTER — Ambulatory Visit: Payer: Self-pay | Admitting: Physical Medicine & Rehabilitation

## 2008-07-04 ENCOUNTER — Encounter: Payer: Self-pay | Admitting: Family Medicine

## 2008-07-08 ENCOUNTER — Ambulatory Visit: Payer: Self-pay | Admitting: Physical Medicine & Rehabilitation

## 2008-08-02 ENCOUNTER — Ambulatory Visit: Payer: Self-pay | Admitting: Family Medicine

## 2008-08-03 ENCOUNTER — Ambulatory Visit: Payer: Self-pay | Admitting: Physical Medicine & Rehabilitation

## 2008-08-03 ENCOUNTER — Encounter: Payer: Self-pay | Admitting: Family Medicine

## 2008-08-24 ENCOUNTER — Encounter
Admission: RE | Admit: 2008-08-24 | Discharge: 2008-11-22 | Payer: Self-pay | Admitting: Physical Medicine & Rehabilitation

## 2008-08-31 ENCOUNTER — Ambulatory Visit: Payer: Self-pay | Admitting: Physical Medicine & Rehabilitation

## 2008-10-04 ENCOUNTER — Ambulatory Visit: Payer: Self-pay | Admitting: Physical Medicine & Rehabilitation

## 2008-11-02 ENCOUNTER — Ambulatory Visit: Payer: Self-pay | Admitting: Physical Medicine & Rehabilitation

## 2008-11-29 ENCOUNTER — Encounter
Admission: RE | Admit: 2008-11-29 | Discharge: 2009-02-27 | Payer: Self-pay | Admitting: Physical Medicine & Rehabilitation

## 2008-11-30 ENCOUNTER — Ambulatory Visit: Payer: Self-pay | Admitting: Physical Medicine & Rehabilitation

## 2009-01-03 ENCOUNTER — Ambulatory Visit: Payer: Self-pay | Admitting: Family Medicine

## 2009-01-03 LAB — CONVERTED CEMR LAB
ALT: 8 units/L (ref 0–53)
AST: 10 units/L (ref 0–37)
Albumin: 4.2 g/dL (ref 3.5–5.2)
Alkaline Phosphatase: 92 units/L (ref 39–117)
BUN: 5 mg/dL — ABNORMAL LOW (ref 6–23)
CO2: 22 meq/L (ref 19–32)
Calcium: 8.5 mg/dL (ref 8.4–10.5)
Chloride: 107 meq/L (ref 96–112)
Creatinine, Ser: 0.64 mg/dL (ref 0.40–1.50)
Glucose, Bld: 67 mg/dL — ABNORMAL LOW (ref 70–99)
HCT: 44.6 % (ref 39.0–52.0)
Hemoglobin: 14.9 g/dL (ref 13.0–17.0)
MCHC: 33.4 g/dL (ref 30.0–36.0)
MCV: 92.9 fL (ref 78.0–100.0)
Phenobarbital: 22.3 ug/mL (ref 15.0–40.0)
Platelets: 237 10*3/uL (ref 150–400)
Potassium: 3.1 meq/L — ABNORMAL LOW (ref 3.5–5.3)
RBC: 4.8 M/uL (ref 4.22–5.81)
RDW: 13.6 % (ref 11.5–15.5)
Sodium: 141 meq/L (ref 135–145)
Total Bilirubin: 0.2 mg/dL — ABNORMAL LOW (ref 0.3–1.2)
Total Protein: 6.4 g/dL (ref 6.0–8.3)
WBC: 9.6 10*3/uL (ref 4.0–10.5)

## 2009-01-04 ENCOUNTER — Encounter: Payer: Self-pay | Admitting: Family Medicine

## 2009-01-06 ENCOUNTER — Ambulatory Visit: Payer: Self-pay | Admitting: Physical Medicine & Rehabilitation

## 2009-01-12 ENCOUNTER — Ambulatory Visit: Payer: Self-pay | Admitting: Family Medicine

## 2009-01-12 ENCOUNTER — Encounter: Payer: Self-pay | Admitting: Family Medicine

## 2009-01-12 LAB — CONVERTED CEMR LAB
Potassium: 4.1 meq/L (ref 3.5–5.3)
TSH: 1.946 microintl units/mL (ref 0.350–4.500)

## 2009-01-16 ENCOUNTER — Encounter: Payer: Self-pay | Admitting: Family Medicine

## 2009-02-07 ENCOUNTER — Ambulatory Visit: Payer: Self-pay | Admitting: Physical Medicine & Rehabilitation

## 2009-02-23 ENCOUNTER — Encounter
Admission: RE | Admit: 2009-02-23 | Discharge: 2009-05-24 | Payer: Self-pay | Admitting: Physical Medicine & Rehabilitation

## 2009-03-10 ENCOUNTER — Ambulatory Visit: Payer: Self-pay | Admitting: Physical Medicine & Rehabilitation

## 2009-04-07 ENCOUNTER — Ambulatory Visit: Payer: Self-pay | Admitting: Physical Medicine & Rehabilitation

## 2009-05-05 ENCOUNTER — Ambulatory Visit: Payer: Self-pay | Admitting: Physical Medicine & Rehabilitation

## 2009-05-24 ENCOUNTER — Encounter
Admission: RE | Admit: 2009-05-24 | Discharge: 2009-05-31 | Payer: Self-pay | Admitting: Physical Medicine & Rehabilitation

## 2009-05-31 ENCOUNTER — Ambulatory Visit: Payer: Self-pay | Admitting: Physical Medicine & Rehabilitation

## 2009-06-13 ENCOUNTER — Ambulatory Visit: Payer: Self-pay | Admitting: Family Medicine

## 2009-06-13 DIAGNOSIS — E663 Overweight: Secondary | ICD-10-CM

## 2009-06-13 HISTORY — DX: Overweight: E66.3

## 2009-06-13 LAB — CONVERTED CEMR LAB
ALT: 12 units/L (ref 0–53)
AST: 9 units/L (ref 0–37)
Albumin: 4.7 g/dL (ref 3.5–5.2)
Alkaline Phosphatase: 95 units/L (ref 39–117)
BUN: 6 mg/dL (ref 6–23)
Basophils Absolute: 0 10*3/uL (ref 0.0–0.1)
Basophils Relative: 0 % (ref 0–1)
CO2: 21 meq/L (ref 19–32)
Calcium: 9.4 mg/dL (ref 8.4–10.5)
Chloride: 110 meq/L (ref 96–112)
Creatinine, Ser: 0.69 mg/dL (ref 0.40–1.50)
Eosinophils Absolute: 0.2 10*3/uL (ref 0.0–0.7)
Eosinophils Relative: 2 % (ref 0–5)
Glucose, Bld: 89 mg/dL (ref 70–99)
HCT: 43.3 % (ref 39.0–52.0)
Hemoglobin: 14.7 g/dL (ref 13.0–17.0)
Lymphocytes Relative: 24 % (ref 12–46)
Lymphs Abs: 4 10*3/uL (ref 0.7–4.0)
MCHC: 33.9 g/dL (ref 30.0–36.0)
MCV: 90 fL (ref 78.0–100.0)
Monocytes Absolute: 0.8 10*3/uL (ref 0.1–1.0)
Monocytes Relative: 5 % (ref 3–12)
Neutro Abs: 11.4 10*3/uL — ABNORMAL HIGH (ref 1.7–7.7)
Neutrophils Relative %: 69 % (ref 43–77)
Platelets: 304 10*3/uL (ref 150–400)
Potassium: 4.1 meq/L (ref 3.5–5.3)
RBC: 4.81 M/uL (ref 4.22–5.81)
RDW: 14.1 % (ref 11.5–15.5)
Sodium: 143 meq/L (ref 135–145)
Total Bilirubin: 0.2 mg/dL — ABNORMAL LOW (ref 0.3–1.2)
Total Protein: 6.6 g/dL (ref 6.0–8.3)
WBC: 16.4 10*3/uL — ABNORMAL HIGH (ref 4.0–10.5)

## 2009-06-14 ENCOUNTER — Encounter: Payer: Self-pay | Admitting: Family Medicine

## 2009-06-15 ENCOUNTER — Telehealth: Payer: Self-pay | Admitting: Family Medicine

## 2009-06-16 ENCOUNTER — Ambulatory Visit: Payer: Self-pay | Admitting: Family Medicine

## 2009-06-16 ENCOUNTER — Encounter: Payer: Self-pay | Admitting: Family Medicine

## 2009-06-16 LAB — CONVERTED CEMR LAB
Bilirubin Urine: NEGATIVE
Blood in Urine, dipstick: NEGATIVE
Glucose, Urine, Semiquant: NEGATIVE
Ketones, urine, test strip: NEGATIVE
Nitrite: NEGATIVE
Protein, U semiquant: NEGATIVE
Specific Gravity, Urine: 1.01
Urobilinogen, UA: 0.2
pH: 7

## 2009-06-19 DIAGNOSIS — D72829 Elevated white blood cell count, unspecified: Secondary | ICD-10-CM | POA: Insufficient documentation

## 2009-06-19 LAB — CONVERTED CEMR LAB
Basophils Absolute: 0 10*3/uL (ref 0.0–0.1)
Basophils Relative: 0 % (ref 0–1)
Eosinophils Absolute: 0.2 10*3/uL (ref 0.0–0.7)
Eosinophils Relative: 1 % (ref 0–5)
HCT: 44.3 % (ref 39.0–52.0)
Hemoglobin: 14.9 g/dL (ref 13.0–17.0)
Lymphocytes Relative: 20 % (ref 12–46)
Lymphs Abs: 4 10*3/uL (ref 0.7–4.0)
MCHC: 33.6 g/dL (ref 30.0–36.0)
MCV: 93.1 fL (ref 78.0–100.0)
Monocytes Absolute: 1.3 10*3/uL — ABNORMAL HIGH (ref 0.1–1.0)
Monocytes Relative: 7 % (ref 3–12)
Neutro Abs: 14.1 10*3/uL — ABNORMAL HIGH (ref 1.7–7.7)
Neutrophils Relative %: 72 % (ref 43–77)
Platelets: 335 10*3/uL (ref 150–400)
RBC: 4.76 M/uL (ref 4.22–5.81)
RDW: 14.4 % (ref 11.5–15.5)
WBC: 19.6 10*3/uL — ABNORMAL HIGH (ref 4.0–10.5)

## 2009-06-20 ENCOUNTER — Ambulatory Visit: Payer: Self-pay | Admitting: Family Medicine

## 2009-06-20 ENCOUNTER — Encounter: Admission: RE | Admit: 2009-06-20 | Discharge: 2009-06-20 | Payer: Self-pay | Admitting: Family Medicine

## 2009-06-20 ENCOUNTER — Telehealth (INDEPENDENT_AMBULATORY_CARE_PROVIDER_SITE_OTHER): Payer: Self-pay | Admitting: *Deleted

## 2009-07-04 ENCOUNTER — Ambulatory Visit: Payer: Self-pay | Admitting: Family Medicine

## 2009-07-04 DIAGNOSIS — G8929 Other chronic pain: Secondary | ICD-10-CM | POA: Insufficient documentation

## 2009-07-04 LAB — CONVERTED CEMR LAB
Amphetamine Screen, Ur: NEGATIVE
Barbiturate Quant, Ur: POSITIVE — AB
Benzodiazepines.: NEGATIVE
Bilirubin Urine: NEGATIVE
Cocaine Metabolites: NEGATIVE
Creatinine,U: 72 mg/dL
Direct LDL: 95 mg/dL
Ethyl Alcohol: <10 mg/dL (ref <10)
Glucose, Urine, Semiquant: NEGATIVE
HCT: 45.6 % (ref 39.0–52.0)
Hemoglobin: 15.2 g/dL (ref 13.0–17.0)
Ketones, urine, test strip: NEGATIVE
MCHC: 33.3 g/dL (ref 30.0–36.0)
MCV: 92.3 fL (ref 78.0–100.0)
Marijuana Metabolite: NEGATIVE
Methadone: NEGATIVE
Nitrite: NEGATIVE
Opiate Screen, Urine: NEGATIVE
Phencyclidine (PCP): NEGATIVE
Platelets: 370 10*3/uL (ref 150–400)
Propoxyphene: NEGATIVE
Protein, U semiquant: NEGATIVE
RBC: 4.94 M/uL (ref 4.22–5.81)
RDW: 14.2 % (ref 11.5–15.5)
Specific Gravity, Urine: <1.005
Urobilinogen, UA: 0.2
WBC Urine, dipstick: NEGATIVE
WBC: 15.8 10*3/uL — ABNORMAL HIGH (ref 4.0–10.5)
pH: 6

## 2009-08-08 ENCOUNTER — Ambulatory Visit: Payer: Self-pay | Admitting: Family Medicine

## 2009-08-08 DIAGNOSIS — M7550 Bursitis of unspecified shoulder: Secondary | ICD-10-CM

## 2009-08-08 HISTORY — DX: Bursitis of unspecified shoulder: M75.50

## 2009-08-29 ENCOUNTER — Ambulatory Visit: Payer: Self-pay | Admitting: Family Medicine

## 2009-08-29 LAB — CONVERTED CEMR LAB
Amphetamine Screen, Ur: NEGATIVE
Barbiturate Quant, Ur: POSITIVE — AB
Benzodiazepines.: NEGATIVE
Cocaine Metabolites: NEGATIVE
Creatinine,U: 69.2 mg/dL
Ethyl Alcohol: <10 mg/dL (ref <10)
Marijuana Metabolite: NEGATIVE
Methadone: NEGATIVE
Opiate Screen, Urine: NEGATIVE
Phencyclidine (PCP): NEGATIVE
Propoxyphene: NEGATIVE

## 2009-09-19 ENCOUNTER — Ambulatory Visit: Payer: Self-pay | Admitting: Family Medicine

## 2009-10-21 ENCOUNTER — Emergency Department (HOSPITAL_COMMUNITY): Admission: EM | Admit: 2009-10-21 | Discharge: 2009-10-21 | Payer: Self-pay | Admitting: Emergency Medicine

## 2009-10-24 ENCOUNTER — Ambulatory Visit: Payer: Self-pay | Admitting: Family Medicine

## 2009-10-31 ENCOUNTER — Ambulatory Visit: Payer: Self-pay | Admitting: Family Medicine

## 2009-10-31 DIAGNOSIS — G252 Other specified forms of tremor: Secondary | ICD-10-CM

## 2009-10-31 DIAGNOSIS — S0990XA Unspecified injury of head, initial encounter: Secondary | ICD-10-CM | POA: Insufficient documentation

## 2009-10-31 DIAGNOSIS — R251 Tremor, unspecified: Secondary | ICD-10-CM | POA: Insufficient documentation

## 2009-10-31 HISTORY — DX: Other specified forms of tremor: G25.2

## 2009-11-28 ENCOUNTER — Ambulatory Visit: Payer: Self-pay | Admitting: Family Medicine

## 2010-01-22 ENCOUNTER — Encounter: Payer: Self-pay | Admitting: Family Medicine

## 2010-01-30 ENCOUNTER — Ambulatory Visit: Payer: Self-pay | Admitting: Family Medicine

## 2010-02-13 ENCOUNTER — Encounter: Payer: Self-pay | Admitting: Family Medicine

## 2010-03-06 ENCOUNTER — Ambulatory Visit: Payer: Self-pay | Admitting: Family Medicine

## 2010-05-14 ENCOUNTER — Encounter: Payer: Self-pay | Admitting: Family Medicine

## 2010-05-22 ENCOUNTER — Ambulatory Visit: Payer: Self-pay | Admitting: Family Medicine

## 2010-06-19 ENCOUNTER — Encounter: Payer: Self-pay | Admitting: Family Medicine

## 2010-06-19 ENCOUNTER — Ambulatory Visit: Payer: Self-pay | Admitting: Family Medicine

## 2010-06-19 LAB — CONVERTED CEMR LAB
Cholesterol: 167 mg/dL (ref 0–200)
HDL: 32 mg/dL — ABNORMAL LOW (ref >39)
LDL Cholesterol: 96 mg/dL (ref 0–99)
Total CHOL/HDL Ratio: 5.2
Triglycerides: 196 mg/dL — ABNORMAL HIGH (ref <150)
VLDL: 39 mg/dL (ref 0–40)

## 2010-06-22 ENCOUNTER — Encounter: Payer: Self-pay | Admitting: Family Medicine

## 2010-06-26 ENCOUNTER — Ambulatory Visit: Payer: Self-pay | Admitting: Family Medicine

## 2010-07-08 ENCOUNTER — Encounter: Payer: Self-pay | Admitting: Family Medicine

## 2010-07-08 DIAGNOSIS — J45909 Unspecified asthma, uncomplicated: Secondary | ICD-10-CM

## 2010-07-08 HISTORY — DX: Unspecified asthma, uncomplicated: J45.909

## 2010-08-17 ENCOUNTER — Encounter: Payer: Self-pay | Admitting: Family Medicine

## 2010-09-04 ENCOUNTER — Ambulatory Visit: Payer: Self-pay | Admitting: Family Medicine

## 2010-09-04 ENCOUNTER — Encounter: Payer: Self-pay | Admitting: Family Medicine

## 2010-09-04 LAB — CONVERTED CEMR LAB
ALT: 12 units/L (ref 0–53)
AST: 12 units/L (ref 0–37)
Albumin: 4.6 g/dL (ref 3.5–5.2)
Alkaline Phosphatase: 102 units/L (ref 39–117)
BUN: 7 mg/dL (ref 6–23)
CO2: 23 meq/L (ref 19–32)
Calcium: 9.2 mg/dL (ref 8.4–10.5)
Chloride: 107 meq/L (ref 96–112)
Creatinine, Ser: 0.77 mg/dL (ref 0.40–1.50)
Glucose, Bld: 82 mg/dL (ref 70–99)
HCT: 47.5 % (ref 39.0–52.0)
Hemoglobin: 15.9 g/dL (ref 13.0–17.0)
MCHC: 33.5 g/dL (ref 30.0–36.0)
MCV: 94.1 fL (ref 78.0–100.0)
Platelets: 328 10*3/uL (ref 150–400)
Potassium: 4.2 meq/L (ref 3.5–5.3)
RBC: 5.05 M/uL (ref 4.22–5.81)
RDW: 13.6 % (ref 11.5–15.5)
Sodium: 141 meq/L (ref 135–145)
Total Bilirubin: 0.2 mg/dL — ABNORMAL LOW (ref 0.3–1.2)
Total Protein: 6.8 g/dL (ref 6.0–8.3)
WBC: 13.8 10*3/uL — ABNORMAL HIGH (ref 4.0–10.5)

## 2010-09-05 ENCOUNTER — Encounter: Payer: Self-pay | Admitting: Family Medicine

## 2010-09-16 HISTORY — PX: SHOULDER SURGERY: SHX246

## 2010-10-07 ENCOUNTER — Encounter: Payer: Self-pay | Admitting: Family Medicine

## 2010-10-07 ENCOUNTER — Encounter: Payer: Self-pay | Admitting: Cardiology

## 2010-10-18 NOTE — Assessment & Plan Note (Signed)
Summary: R arm and chest pain   Vital Signs:  Patient Profile:   40 Years Old Male Height:     68.5 inches Weight:      183.9 pounds BMI:     27.65 Pulse rate:   73 / minute BP sitting:   119 / 75  (right arm)  Pt. in pain?   yes    Location:   right arm    Intensity:   6  Vitals Entered By: Arlyss Repress, CMA              Is Patient Diabetic? No    Last TD:  Done. (05/17/1998 12:00:00 AM) TD Result Date:  08/02/2008 TD Result:  given TD Next Due:  10 yr   PCP:  Zachery Dauer MD   History of Present Illness: right biceps pain for 2 weeks without history of injury. 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  right side pain x 3.5 weeks.Constipated, using a suppository.  No blood in BM.   No cough, short of breath, or UTI symptoms   Dr Riley Kill increased his Kadian to 2 - 50 mg a couple months ago for increased back pain  Has been cutting back on calories to lose weight.   Still smokes a few cigarettes a day.         Physical Exam  General:     Well-nourished, in no acute distress; alert,appropriate and cooperative throughout examination Chest Wall:     Tender over right lower ribs anterior axillary line without contusion Lungs:     Normal respiratory effort, chest expands symmetrically. Lungs are clear to auscultation, no crackles or wheezes. Heart:     Normal rate and regular rhythm. S1 and S2 normal without gallop, murmur, click, rub or other extra sounds. Abdomen:     soft, non-tender, no masses, no hepatomegaly, and no splenomegaly, mod obese.   Msk:     Tender over proximal biceps, but full range of motion and normal strength    Impression & Recommendations:  Problem # 1:  ARM PAIN, RIGHT (ICD-729.5) Musculoskeletal. Course of Diclofenac Orders: [facility]- Est  Level 4 (64403)   Problem # 2:  OBESITY (ICD-278.00) Congratulated on weight loss Orders: [facility]- Est  Level 4 (47425)   Problem # 3:  CHEST WALL PAIN, ANTERIOR  (ICD-786.52) Musculoskeletal His updated medication list for this problem includes:    Diclofenac Sodium 75 Mg Tbec (Diclofenac sodium) .Marland Kitchen... Take one tablet two times a day  Orders: [facility]- Est  Level 4 (95638)   Problem # 4:  CONVULSIONS, SEIZURES, NOS (ICD-780.39) Assessment: Unchanged  His updated medication list for this problem includes:    Phenobarbital 64.8 Mg Tabs (Phenobarbital) .Marland Kitchen... Take 3 tabs at bedtime    Lyrica 75 Mg Caps (Pregabalin) .Marland Kitchen... Take 1 capsule by mouth three times a day  Orders: [facility]- Est  Level 4 (99214)   Complete Medication List: 1)  Lidoderm 5 % Ptch (Lidocaine) .... Apply once daily for 12 hours. max 3 patches at a time 2)  Combivent 103-18 Mcg/act Aero (Albuterol-ipratropium) .... Take 2 puffs every 4-6 hours as needed 3)  Prilosec Otc 20 Mg Tbec (Omeprazole magnesium) .... Take one tablet daily 4)  Phenobarbital 64.8 Mg Tabs (Phenobarbital) .... Take 3 tabs at bedtime 5)  Flonase 50 Mcg/act Susp (Fluticasone propionate) .... Spray 1 puff into both nostrils once a day 6)  Kadian 50 Mg Cp24 (Morphine sulfate) .... Take 1 capsule by mouth every 24 hours  7)  Lyrica 75 Mg Caps (Pregabalin) .... Take 1 capsule by mouth three times a day 8)  Simvastatin 40 Mg Tabs (Simvastatin) .... Take 1 tablet by mouth at bedtime 9)  Cipro Hc 0.2-1 % Susp (Ciprofloxacin-hydrocortisone) .... Instill 3 drops in affected ear two times a day for 7 days 10)  Alprazolam 0.5 Mg Tbdp (Alprazolam) .... Take one tablet two times a day 11)  Requip 1 Mg Tabs (Ropinirole hcl) .... Take 2 tabs at bedtime 12)  Vicodin 5-500 Mg Tabs (Hydrocodone-acetaminophen) .... Take one tablet three times a day as needed 13)  Diclofenac Sodium 75 Mg Tbec (Diclofenac sodium) .... Take one tablet two times a day  Other Orders: Tdap => 62yrs IM ([address]) Admin 1st Vaccine (60454)   Patient Instructions: 1)  Please schedule a follow-up appointment in 3 months. 2)  Recheck if right arm and right lower  chest pain don't improve with Diclofenac. 3)  Congratulations on losing weight/  4)  Quit smoking completely, please.    Prescriptions: VICODIN 5-500 MG TABS (HYDROCODONE-ACETAMINOPHEN) Take one tablet three times a day as needed  #90 x 2   Entered and Authorized by:   Zachery Dauer MD   Signed by:   Zachery Dauer MD on 08/02/2008   Method used:   Print then Give to Patient   RxID:   [id number] PHENOBARBITAL 64.8 MG  TABS (PHENOBARBITAL) Take 3 tabs at bedtime  #90 x 11   Entered and Authorized by:   Zachery Dauer MD   Signed by:   Zachery Dauer MD on 08/02/2008   Method used:   Print then Give to Patient   RxID:   [id number] VICODIN 5-500 MG TABS (HYDROCODONE-ACETAMINOPHEN) Take one tablet three times a day as needed  #90 x 0   Entered and Authorized by:   Zachery Dauer MD   Signed by:   Zachery Dauer MD on 08/02/2008   Method used:   Print then Give to Patient   RxID:   [id number] PHENOBARBITAL 64.8 MG  TABS (PHENOBARBITAL) Take 3 tabs at bedtime  #90 x 11   Entered and Authorized by:   Zachery Dauer MD   Signed by:   Zachery Dauer MD on 08/02/2008   Method used:   Print then Give to Patient   RxID:   [id number] SIMVASTATIN 40 MG TABS (SIMVASTATIN) Take 1 tablet by mouth at bedtime  #30 x 11   Entered and Authorized by:   Zachery Dauer MD   Signed by:   Zachery Dauer MD on 08/02/2008   Method used:   Electronically to        [facility] [facility] [address] [id number] (retail)       [address]       [place], [place]  [address]       Ph: [phone] or [phone]       Fax: [phone]   RxID:   [id number] PRILOSEC OTC 20 MG  TBEC (OMEPRAZOLE MAGNESIUM) Take one tablet daily  #30 x 12   Entered and Authorized by:   Zachery Dauer MD   Signed by:   Zachery Dauer MD on 08/02/2008   Method used:   Electronically to        [facility]  [address] [phone] (retail)       [address] [facility]       [place], [place]  [address]  Ph: [phone] or  [phone]       Fax: [phone]   RxID:   [id number] LIDODERM 5 %  PTCH (LIDOCAINE) Apply once daily for 12 hours. Max 3 patches at a time  #30 Each x 11   Entered and Authorized by:   Zachery Dauer MD   Signed by:   Zachery Dauer MD on 08/02/2008   Method used:   [facility]ronically to        [facility] [address] [phone]* (retail)       [address]       [place], [place]  [address]       Ph: [phone] or [phone]       Fax: [phone]   RxID:   [id number] DICLOFENAC SODIUM 75 MG TBEC (DICLOFENAC SODIUM) Take one tablet two times a day  #20 x 0   Entered and Authorized by:   Zachery Dauer MD   Signed by:   Zachery Dauer MD on 08/02/2008   Method used:   [facility]ronically to        [facility] [phone]* (retail)       [address]       [place], [place]  [address]       Ph: [phone] or [phone]       Fax: [phone]   RxID:   [id number]  ]  Tetanus/Td Vaccine    Vaccine Type: Tdap    Site: left deltoid    Mfr: Sanofi Pasteur    Dose: 0.5 ml    Route: IM    Given by: Arlyss Repress CMA,    Exp. Date: 08/18/2010    Lot #: [id number]    VIS given: 08/04/07 version given August 02, 2008.

## 2010-10-18 NOTE — Assessment & Plan Note (Signed)
Summary: FU SHOULDER/KH   Vital Signs:  Patient profile:   40 year old male Height:      68 inches Weight:      175 pounds BMI:     26.70 Temp:     98.2 degrees F oral Pulse rate:   76 / minute BP sitting:   121 / 79  (left arm) Cuff size:   regular  Vitals Entered By: Tessie Fass CMA (September 19, 2009 2:03 PM) CC: F/U right shoulder pain Is Patient Diabetic? No Pain Assessment Patient in pain? yes     Location: shoulder Intensity: 4   Primary Care Provider:  Zachery Dauer MD  CC:  F/U right shoulder pain.  History of Present Illness: right shoulder hurts outside when working in the cold. Not a problem at night.   2 .5 weeks of sinus congestion, blood from nose and throat. Humifier in room but throat still dry in AM. No recent virus symptoms.  Burning and watering of eyes.   Habits & Providers  Alcohol-Tobacco-Diet     Tobacco Status: quit  Allergies (verified): No Known Drug Allergies   Complete Medication List: 1)  Combivent 103-18 Mcg/act Aero (Albuterol-ipratropium) .... Take 2 puffs every 4-6 hours as needed 2)  Prilosec Otc 20 Mg Tbec (Omeprazole magnesium) .... Take one tablet daily 3)  Phenobarbital 64.8 Mg Tabs (Phenobarbital) .... Take 3 tabs at bedtime 4)  Flonase 50 Mcg/act Susp (Fluticasone propionate) .... Spray 1 puff into both nostrils once a day 5)  Simvastatin 40 Mg Tabs (Simvastatin) .... Take 1 tablet by mouth at bedtime 6)  Alprazolam 0.5 Mg Tbdp (Alprazolam) .... Take one tablet two times a day 7)  Vicodin 5-500 Mg Tabs (Hydrocodone-acetaminophen) .... Take one tablet three times a day as needed 8)  Topiramate 50 Mg Tabs (Topiramate) .... Take 2 tabs at bedtime 9)  Requip 1 Mg Tabs (Ropinirole hcl) .... Take 2 tablets at bedtime 10)  Diclofenac Sodium 75 Mg Tbec (Diclofenac sodium) .... Take one tab two times a day  Patient Instructions: 1)  Please schedule a follow-up appointment in 1 month.  2)  Do exercises I taught you to increase  motion in right shoulder.  3)  Take Diclofenac if the shoulder becomes sore again.  Prescriptions: DICLOFENAC SODIUM 75 MG TBEC (DICLOFENAC SODIUM) Take one tab two times a day  #30 x 0   Entered and Authorized by:   Zachery Dauer MD   Signed by:   Zachery Dauer MD on 09/19/2009   Method used:   Print then Give to Patient   RxID:   [id number] VICODIN 5-500 MG TABS (HYDROCODONE-ACETAMINOPHEN) Take one tablet three times a day as needed  #90 x 0   Entered and Authorized by:   Zachery Dauer MD   Signed by:   Zachery Dauer MD on 09/19/2009   Method used:   Print then Give to Patient   RxID:   [id number]   Appended Document: FU SHOULDER/KH     Allergies: No Known Drug Allergies  Physical Exam  General:  Well-developed,well-nourished,in no acute distress; alert,appropriate and cooperative throughout examination. Evidences less pain when he rotates the right shoulder Eyes:  No corneal or conjunctival inflammation noted. EOMI.  Ears:  External ear exam shows no significant lesions or deformities.  Otoscopic examination reveals clear canals, tympanic membranes are intact bilaterally without bulging, retraction, inflammation or discharge. Hearing is grossly normal bilaterally. Nose:  Mildly congested. Dry mucosas Mouth:  Oral mucosa and oropharynx without  lesions or exudates.  Teeth in good repair. Lungs:  Normal respiratory effort, chest expands symmetrically. Lungs are clear to auscultation, no crackles or wheezes. Heart:  Normal rate and regular rhythm. S1 and S2 normal without gallop, murmur, click, rub or other extra sounds. Msk:  Negative drop test  right shoulderarc less painful  Full ext rotation, on int rotation can barely get arm behind back Psych:  normally interactive and good eye contact.  Smiling, except during shoulder exam   Impression & Recommendations:  Problem # 1:  ROTATOR CUFF SYNDROME, RIGHT (ICD-726.10) Improved, but limited int rot. Taught wall climing and  behind back towel exercises Orders: [facility]- Est Level  3 (04540)  Problem # 2:  CHRONIC PAIN SYNDROME (ICD-338.4) stable and reponding to Vicodin alone. No narcotics on urine screen. Will have to check on sensitivitiy for hydrocodone. Orders: [facility]- Est Level  3 (98119)  Problem # 3:  CONVULSIONS, SEIZURES, NOS (ICD-780.39) None recently His updated medication list for this problem includes:    Phenobarbital 64.8 Mg Tabs (Phenobarbital) .Marland Kitchen... Take 3 tabs at bedtime    Topiramate 50 Mg Tabs (Topiramate) .Marland Kitchen... Take 2 tabs at bedtime  Problem # 4:  RHINITIS, ALLERGIC (ICD-477.9) Increasd irritation from dry winter air His updated medication list for this problem includes:    Flonase 50 Mcg/act Susp (Fluticasone propionate) ..... Spray 1 puff into both nostrils once a day  Orders: [facility]- Est Level  3 (14782)  Complete Medication List: 1)  Combivent 103-18 Mcg/act Aero (Albuterol-ipratropium) .... Take 2 puffs every 4-6 hours as needed 2)  Prilosec Otc 20 Mg Tbec (Omeprazole magnesium) .... Take one tablet daily 3)  Phenobarbital 64.8 Mg Tabs (Phenobarbital) .... Take 3 tabs at bedtime 4)  Flonase 50 Mcg/act Susp (Fluticasone propionate) .... Spray 1 puff into both nostrils once a day 5)  Simvastatin 40 Mg Tabs (Simvastatin) .... Take 1 tablet by mouth at bedtime 6)  Alprazolam 0.5 Mg Tbdp (Alprazolam) .... Take one tablet two times a day 7)  Vicodin 5-500 Mg Tabs (Hydrocodone-acetaminophen) .... Take one tablet three times a day as needed 8)  Topiramate 50 Mg Tabs (Topiramate) .... Take 2 tabs at bedtime 9)  Requip 1 Mg Tabs (Ropinirole hcl) .... Take 2 tablets at bedtime 10)  Diclofenac Sodium 75 Mg Tbec (Diclofenac sodium) .... Take one tab two times a day

## 2010-10-18 NOTE — Consult Note (Signed)
Summary: SM&[facility]  SM&OC   Imported By: Bradly Bienenstock 05/16/2010 17:24:09  _____________________________________________________________________  External Attachment:    Type:   Image     Comment:   External Document

## 2010-10-18 NOTE — Miscellaneous (Signed)
Summary: Orders Update  Clinical Lists Changes  Medications: Added new medication of VICODIN 5-500 MG TABS (HYDROCODONE-ACETAMINOPHEN) Take one tablet three times a day as needed - Signed Rx of VICODIN 5-500 MG TABS (HYDROCODONE-ACETAMINOPHEN) Take one tablet three times a day as needed;  #90 x 0;  Signed;  Entered by: Luretha Murphy NP;  Authorized by: Luretha Murphy NP;  Method used: Printed then faxed to [facility]*, [address], [place], [place]  [address], Ph: [phone], Fax: [phone]    Prescriptions: VICODIN 5-500 MG TABS (HYDROCODONE-ACETAMINOPHEN) Take one tablet three times a day as needed  #90 x 0   Entered and Authorized by:   Luretha Murphy NP   Signed by:   Luretha Murphy NP on 07/04/2008   Method used:   Printed then faxed to ...       [address]* (retail)       [address]       [place], [place]  [address]       Ph: [phone]       Fax: [phone]   RxID:   [id number]   Appended Document: Orders Update [facility] [address] is [facility] rx is supposed to go

## 2010-10-18 NOTE — Assessment & Plan Note (Signed)
Summary: flu shot wp  Nurse Visit    Prior Medications: LIDODERM 5 %  PTCH (LIDOCAINE) Apply once daily for 12 hours. Max 3 patches at a time COMBIVENT 103-18 MCG/ACT  AERO (ALBUTEROL-IPRATROPIUM) Take 2 puffs every 4-6 hours as needed PRILOSEC OTC 20 MG  TBEC (OMEPRAZOLE MAGNESIUM) Take one tablet daily PHENOBARBITAL 64.8 MG  TABS (PHENOBARBITAL) Take 3 tabs at bedtime FLONASE 50 MCG/ACT SUSP (FLUTICASONE PROPIONATE) Spray 1 puff into both nostrils once a day KADIAN 50 MG CP24 (MORPHINE SULFATE) Take 1 capsule by mouth every 24 hours LYRICA 75 MG CAPS (PREGABALIN) Take 1 capsule by mouth three times a day SIMVASTATIN 40 MG TABS (SIMVASTATIN) Take 1 tablet by mouth at bedtime VICODIN 5-500 MG TABS (HYDROCODONE-ACETAMINOPHEN) Take 1 tablet by mouth three times a day CIPRO HC 0.2-1 %  SUSP (CIPROFLOXACIN-HYDROCORTISONE) Instill 3 drops in affected ear two times a day for 7 days ALPRAZOLAM 0.5 MG  TBDP (ALPRAZOLAM) Take one tablet two times a day REQUIP 1 MG  TABS (ROPINIROLE HCL) Take 2 tabs at bedtime STERAPRED 12 DAY 5 MG TABS (PREDNISONE) as directed HYDROCORTISONE 2.5 % CREA (HYDROCORTISONE) apply to rash two times a day as needed, 60 GM    Influenza Vaccine    Vaccine Type: Fluvax Non-MCR    Site: right deltoid    Mfr: GlaxoSmithKline    Dose: 0.5 ml    Route: IM    Given by: Dennison Nancy RN    Exp. Date: 03/15/2009    Lot #: [id number]    VIS given: 04/09/07 version given June 08, 2008.  Flu Vaccine Consent Questions    Do you have a history of severe allergic reactions to this vaccine? no    Any prior history of allergic reactions to egg and/or gelatin? no    Do you have a sensitivity to the preservative Thimersol? no    Do you have a past history of Guillan-Barre Syndrome? no    Do you currently have an acute febrile illness? no    Have you ever had a severe reaction to latex? no    Vaccine information given and explained to patient? yes   Orders Added: 1)   Influenza Vaccine NON MCR [00028] 2)  Est Level 1- [facility] [[address]]    ]

## 2010-10-18 NOTE — Assessment & Plan Note (Signed)
Summary: back pain, Insomnia wp   Vital Signs:  Patient Profile:   40 Years Old Male Weight:      219.2 pounds Temp:     97.8 degrees F oral Pulse rate:   88 / minute BP sitting:   140 / 87  (right arm)  Pt. in pain?   yes    Location:   back    Intensity:   7  Vitals Entered By: Clide Dales, gtcc/sma                  PCP:  Zachery Dauer MD  Chief Complaint:  refill meds. problem with sleeping. sleeps 3-4 hours and wakes up using bathroom. unable to go back to sleep. uses tylenol pm and not working.  History of Present Illness: Awakens  ~2 AM to urinate then can't return to sleep. Drinks minimal caffeine, couple cups of tea. No alcohol. Takes phenobarbital 8:30 pm when goes to bed. Normally would get up 6 AM.   No daytime naps.    Back pain continues. Dr Hermelinda Medicus is referring him for ? nerve ablation. He's prescribing the Kadian.   Occ spells of being shakey and spacing out at times, but not full seizures.   Asthma stable but nose and sinuses irritated  Gerd stable. Mild heartburn relieved by Prilosec-OTC.  Lipid Management History:      Positive NCEP/ATP III risk factors include HDL cholesterol less than 40, family history for ischemic heart disease (females less than 8 years old), and current tobacco user.  Negative NCEP/ATP III risk factors include male age less than 7 years old, non-diabetic, no ASHD (atherosclerotic heart disease), no prior stroke/TIA, no peripheral vascular disease, and no history of aortic aneurysm.        Family History:         lung cancer - F died        Family History Hypertension mother    Family History of CAD Male 1st degree relative <60 Mother     Physical Exam  General:     alert and overweight-appearing.   Ears:     External ear exam shows no significant lesions or deformities.  Otoscopic examination reveals clear canals, tympanic membranes are intact bilaterally without bulging, retraction, inflammation or discharge.  Hearing is grossly normal bilaterally. Nose:     External nasal examination shows no deformity or inflammation. Nasal mucosa are pink and moist without lesions or exudates. Neck:     No deformities, masses, or tenderness noted. Lungs:     Normal respiratory effort, chest expands symmetrically. Lungs are clear to auscultation, except bibasilar wheezes. Heart:     Normal rate and regular rhythm. S1 and S2 normal without gallop, murmur, click, rub or other extra sounds. Abdomen:     soft, non-tender, no masses, no hepatomegaly, and no splenomegaly, mod obese.   Neurologic:     alert & oriented X3.   Psych:     normally interactive, good eye contact, not anxious appearing, and not depressed appearing.      Impression & Recommendations:  Problem # 1:  INSOMNIA (ICD-780.52) Initial and during the night. No indications of depression. Avoid watching tv. Given AAFP handout on Insomnia His updated medication list for this problem includes:    Phenobarbital 64.8 Mg Tabs (Phenobarbital) .Marland Kitchen... Take 3 tabs at bedtime   Problem # 2:  BACK PAIN W/RADIATION, UNSPECIFIED (ICD-724.4) Assessment: Unchanged Managed by Dr Hermelinda Medicus His updated medication list for this problem includes:  Kadian 50 Mg Cp24 (Morphine sulfate) .Marland Kitchen... Take 1 capsule by mouth every 24 hours    Vicodin 5-500 Mg Tabs (Hydrocodone-acetaminophen) .Marland Kitchen... Take 1 tablet by mouth three times a day   Problem # 3:  ASTHMA, UNSPECIFIED (ICD-493.90) Assessment: Improved More problems with nasal mucosa though using humidifiers at home His updated medication list for this problem includes:    Combivent 103-18 Mcg/act Aero (Albuterol-ipratropium) .Marland Kitchen... Take 2 puffs every 4-6 hours as needed   Complete Medication List: 1)  Lidoderm 5 % Ptch (Lidocaine) .... Apply once daily for 12 hours. max 3 patches at a time 2)  Combivent 103-18 Mcg/act Aero (Albuterol-ipratropium) .... Take 2 puffs every 4-6 hours as needed 3)  Prilosec Otc 20 Mg  Tbec (Omeprazole magnesium) .... Take one tablet daily 4)  Phenobarbital 64.8 Mg Tabs (Phenobarbital) .... Take 3 tabs at bedtime 5)  Flonase 50 Mcg/act Susp (Fluticasone propionate) .... Spray 1 puff into both nostrils once a day 6)  Kadian 50 Mg Cp24 (Morphine sulfate) .... Take 1 capsule by mouth every 24 hours 7)  Lyrica 75 Mg Caps (Pregabalin) .... Take 1 capsule by mouth three times a day 8)  Simvastatin 40 Mg Tabs (Simvastatin) .... Take 1 tablet by mouth at bedtime 9)  Vicodin 5-500 Mg Tabs (Hydrocodone-acetaminophen) .... Take 1 tablet by mouth three times a day 10)  Cipro Hc 0.2-1 % Susp (Ciprofloxacin-hydrocortisone) .... Instill 3 drops in affected ear two times a day for 7 days  Lipid Assessment/Plan:      Based on NCEP/ATP III, the patient's risk factor category is "2 or more risk factors and a calculated 10 year CAD risk of < 20%".  From this information, the patient's calculated lipid goals are as follows: Total cholesterol goal is 200; LDL cholesterol goal is 130; HDL cholesterol goal is 40; Triglyceride goal is 150.  His LDL cholesterol goal has been met.     Patient Instructions: 1)  Please schedule a follow-up appointment in 6 months. 2)  Avoid watching tv during the night    Prescriptions: VICODIN 5-500 MG TABS (HYDROCODONE-ACETAMINOPHEN) Take 1 tablet by mouth three times a day  #90 x 2   Entered and Authorized by:   Zachery Dauer MD   Signed by:   Zachery Dauer MD on 09/29/2007   Method used:   Print then Give to Patient   RxID:   [id number] PHENOBARBITAL 64.8 MG  TABS (PHENOBARBITAL) Take 3 tabs at bedtime  #90 x 12   Entered and Authorized by:   Zachery Dauer MD   Signed by:   Zachery Dauer MD on 09/29/2007   Method used:   Print then Give to Patient   RxID:   [id number]  ]  Appended Document: Orders Update    Clinical Lists Changes  Orders: Added new Test order of [facility]- Est  Level 4 ([address]696) - Signed

## 2010-10-18 NOTE — Letter (Signed)
Summary: Results Follow-up Letter  [facility]  [address]   [place], [place] [address]   Phone: [phone]  Fax: [phone]    09/05/2010  [id number] [place], [place]  [address]  Dear Mr. Hartnett,   The following are the results of your recent test(s): Your kidney and liver function are normal.  Tests: (1) CMP with Estimated GFR (2402)   Sodium                    141 mEq/L                   135-145   Potassium                 4.2 mEq/L                   3.5-5.3   Chloride                  107 mEq/L                   96-112   CO2                       23 mEq/L                    19-32   Glucose                   82 mg/dL                    28-41   BUN                       7 mg/dL                     3-24   Creatinine                0.77 mg/dL                  0.40-1.50   Bilirubin, Total     [L]  0.2 mg/dL                   4.0-1.0   Alkaline Phosphatase      102 U/L                     39-117   AST/SGOT                  12 U/L                      0-37   ALT/SGPT                  12 U/L                      0-53   Total Protein             6.8 g/dL                    2.7-2.5   Albumin                   4.6 g/dL  3.5-5.2   Calcium                   9.2 mg/dL                   1.6-10.9  Tests: (2) CBC NO Diff (Complete Blood Count) (10000)   WBC                  [H]  13.8 K/uL                   4.0-10.5   RBC                       5.05 MIL/uL                 4.22-5.81   Hemoglobin                15.9 g/dL                   60.4-54.0   Hematocrit                47.5 %                      39.0-52.0   MCV                       94.1 fL                     78.0-100.0 ! MCH                       31.5 pg                     26.0-34.0   MCHC                      33.5 g/dL                   98.1-19.1   RDW                       13.6 %                      11.5-15.5   Platelet Count            328 K/uL                     150-400 Your white blood count is the most normal that it has been.  Document Creation Date: 09/05/2010 2:33 AM Merry Christmas! Sincerely,  Zachery Dauer MD [facility]           Appended Document: Results Follow-up Letter mailed

## 2010-10-18 NOTE — Assessment & Plan Note (Signed)
Summary: f/u last visit/eo   Vital Signs:  Patient profile:   40 year old male Height:      68 inches Weight:      176.8 pounds BMI:     26.98 Pulse rate:   80 / minute BP sitting:   122 / 82  (right arm)  Vitals Entered By: Arlyss Repress CMA, (August 08, 2009 1:33 PM) CC: refill meds. right shoulder pain x 3 months Is Patient Diabetic? No Pain Assessment Patient in pain? yes     Location: right shoulder Intensity: 8 Onset of pain  x 3 months   Primary Care Provider:  Zachery Dauer MD  CC:  refill meds. right shoulder pain x 3 months.  History of Present Illness: Normal bowel movement in A.M. then several diarrheal stools  right shoulder remains painful. At times awakens him so that he cries out. Painful with reaching up. Present 3 months since he pulled a pump out of a well.   Back pain persists. left leg sometimes weak. Numb medial left foot at times  Insomnia persists. Sleeps 3-4 hours a night  Had 2 seizures according to others. Doesn't lose postural tone.   Occ cigar but otherwise not smoking x 18 mos.   Nose congested. Using a humidifier. No recent asthma.   Habits & Providers  Alcohol-Tobacco-Diet     Tobacco Status: quit  Current Medications (verified): 1)  Combivent 103-18 Mcg/act  Aero (Albuterol-Ipratropium) .... Take 2 Puffs Every 4-6 Hours As Needed 2)  Prilosec Otc 20 Mg  Tbec (Omeprazole Magnesium) .... Take One Tablet Daily 3)  Phenobarbital 64.8 Mg  Tabs (Phenobarbital) .... Take 3 Tabs At Bedtime 4)  Flonase 50 Mcg/act Susp (Fluticasone Propionate) .... Spray 1 Puff Into Both Nostrils Once A Day 5)  Simvastatin 40 Mg Tabs (Simvastatin) .... Take 1 Tablet By Mouth At Bedtime 6)  Alprazolam 0.5 Mg  Tbdp (Alprazolam) .... Take One Tablet Two Times A Day 7)  Vicodin 5-500 Mg Tabs (Hydrocodone-Acetaminophen) .... Take One Tablet Three Times A Day As Needed 8)  Topiramate 50 Mg Tabs (Topiramate) .... Take 2 Tabs At Bedtime 9)  Requip 1 Mg Tabs  (Ropinirole Hcl) .... Take 2 Tablets At Bedtime  Allergies (verified): No Known Drug Allergies  Social History: Single disabled due to back pain Smokes occasional cigar, quit cigarettes in 2008 Rare beer; Drinks large amounts of sweet iced tea limited driving  Physical Exam  General:  alert.  Well appearing.  Lungs:  Normal respiratory effort, chest expands symmetrically. Lungs are clear to auscultation, no crackles or wheezes. Heart:  Normal rate and regular rhythm. S1 and S2 normal without gallop, murmur, click, rub or other extra sounds. Abdomen:  Mildly tender RLQ to suprapubic area. soft, no hepatomegaly, and no splenomegaly.  No rebound tenderness, Msk:  Negative drop test  right shoulder painful arc, tender supraspinatus area. + impingement sign. Non tender biceps and AC joint.  Neurologic:  alert & oriented X3.  sitting root causes leg pain on left  Skin:  tanned, ruddy complexion Psych:  normally interactive and good eye contact.     Impression & Recommendations:  Problem # 1:  ROTATOR CUFF SYNDROME, RIGHT (ICD-726.10)  May use Ibuprofen if pain remains after injection  Orders: Injection, large joint- [facility] ([address])  Problem # 2:  BACK PAIN W/RADIATION, UNSPECIFIED (ICD-724.4) Continue same His updated medication list for this problem includes:    Vicodin 5-500 Mg Tabs (Hydrocodone-acetaminophen) .Marland Kitchen... Take one tablet three times a day  as needed  Problem # 3:  INSOMNIA (ICD-780.52) Ativan as needed. Assess further next visit His updated medication list for this problem includes:    Phenobarbital 64.8 Mg Tabs (Phenobarbital) .Marland Kitchen... Take 3 tabs at bedtime  Problem # 4:  CONVULSIONS, SEIZURES, NOS (ICD-780.39)  minimally symptomatic His updated medication list for this problem includes:    Phenobarbital 64.8 Mg Tabs (Phenobarbital) .Marland Kitchen... Take 3 tabs at bedtime    Topiramate 50 Mg Tabs (Topiramate) .Marland Kitchen... Take 2 tabs at bedtime  Orders: [facility]- Est Level  3  (91478)  Problem # 5:  DIARRHEA (ICD-787.91)  No apparent cause. Abdominal pain has resolved.   Orders: [facility]- Est Level  3 ([address]562)  Complete Medication List: 1)  Combivent 103-18 Mcg/act Aero (Albuterol-ipratropium) .... Take 2 puffs every 4-6 hours as needed 2)  Prilosec Otc 20 Mg Tbec (Omeprazole magnesium) .... Take one tablet daily 3)  Phenobarbital 64.8 Mg Tabs (Phenobarbital) .... Take 3 tabs at bedtime 4)  Flonase 50 Mcg/act Susp (Fluticasone propionate) .... Spray 1 puff into both nostrils once a day 5)  Simvastatin 40 Mg Tabs (Simvastatin) .... Take 1 tablet by mouth at bedtime 6)  Alprazolam 0.5 Mg Tbdp (Alprazolam) .... Take one tablet two times a day 7)  Vicodin 5-500 Mg Tabs (Hydrocodone-acetaminophen) .... Take one tablet three times a day as needed 8)  Topiramate 50 Mg Tabs (Topiramate) .... Take 2 tabs at bedtime 9)  Requip 1 Mg Tabs (Ropinirole hcl) .... Take 2 tablets at bedtime  Patient Instructions: 1)  The right shoulder pain should improve over the next couple days, but should gradually improve from the steroid.  2)  Please schedule a follow-up appointment in 2 weeks.  Prescriptions: VICODIN 5-500 MG TABS (HYDROCODONE-ACETAMINOPHEN) Take one tablet three times a day as needed  #90 x 0   Entered and Authorized by:   Zachery Dauer MD   Signed by:   Zachery Dauer MD on 08/08/2009   Method used:   Print then Give to Patient   RxID:   [id number] ALPRAZOLAM 0.5 MG  TBDP (ALPRAZOLAM) Take one tablet two times a day  #60 x 2   Entered and Authorized by:   Zachery Dauer MD   Signed by:   Zachery Dauer MD on 08/08/2009   Method used:   Print then Give to Patient   RxID:   [id number] SIMVASTATIN 40 MG TABS (SIMVASTATIN) Take 1 tablet by mouth at bedtime  #31 Tablet x 11   Entered and Authorized by:   Zachery Dauer MD   Signed by:   Zachery Dauer MD on 08/08/2009   Method used:   [facility]  [address] [id number]       [address]        [place], [place]  [address]       Ph: [phone]       Fax: [phone]   RxID:   [id number]    Procedure Note Last Tetanus: given (08/02/2008)  Injections: Duration of symptoms: 4 weeks Indication: chronic pain  Procedure # 1: joint injection    Region: posterior    Location: right shoulder    Technique: 24 g needle    Medication: Triamcinolone 40 mg/mL    Anesthesia: 0.5% marcaine        Prevention & Chronic Care Immunizations   Influenza vaccine: Fluvax Non-MCR  (06/08/2008)   Influenza vaccine due:  06/08/2009    Tetanus booster: 08/02/2008: given   Tetanus booster due: 08/02/2018    Pneumococcal vaccine: Not documented  Other Screening   Smoking status: quit  (08/08/2009)  Lipids   Total Cholesterol: 159  (03/22/2008)   LDL: 98  (03/22/2008)   LDL Direct: 95  (07/04/2009)   HDL: 35  (03/22/2008)   Triglycerides: 131  (03/22/2008)   Lipid panel due: 07/21/2009    SGOT (AST): 9  (06/13/2009)   SGPT (ALT): 12  (06/13/2009)   Alkaline phosphatase: 95  (06/13/2009)   Total bilirubin: 0.2  (06/13/2009)  Self-Management Support :   Personal Goals (by the next clinic visit) :      Personal LDL goal: 130  (06/20/2009)    Lipid self-management support: Not documented

## 2010-10-18 NOTE — Assessment & Plan Note (Signed)
Summary: f/u eo   Vital Signs:  Patient profile:   40 year old male Height:      68 inches Weight:      172 pounds BMI:     26.25 Pulse rate:   80 / minute BP sitting:   118 / 76  (right arm)  Vitals Entered By: Arlyss Repress CMA, (March 06, 2010 1:34 PM) CC: f/up right shoulder.  has surgery tomorrow. f/up last OV Is Patient Diabetic? No Pain Assessment Patient in pain? yes     Location: right shoulder Intensity: 6 Onset of pain  Chronic   CC:  f/up right shoulder.  has surgery tomorrow. f/up last OV.  Habits & Providers  Alcohol-Tobacco-Diet     Tobacco Status: quit     Tobacco Counseling: to remain off tobacco products     Year Quit: 02-2010  Comments: quitt x 2 weeks  Current Medications (verified): 1)  Combivent 103-18 Mcg/act  Aero (Albuterol-Ipratropium) .... Take 2 Puffs Every 4-6 Hours As Needed 2)  Omeprazole 20 Mg Cpdr (Omeprazole) .... Take One Tab Daily 3)  Phenobarbital 64.8 Mg  Tabs (Phenobarbital) .... Take 3 Tabs At Bedtime 4)  Flonase 50 Mcg/act Susp (Fluticasone Propionate) .... Spray 1 Puff Into Both Nostrils Once A Day 5)  Simvastatin 40 Mg Tabs (Simvastatin) .... Take 1 Tablet By Mouth At Bedtime 6)  Alprazolam 0.5 Mg  Tbdp (Alprazolam) .... Take One Tablet Two Times A Day 7)  Vicodin 5-500 Mg Tabs (Hydrocodone-Acetaminophen) .... Take One Tablet Three Times A Day As Needed 8)  Topiramate 50 Mg Tabs (Topiramate) .... Take 2 Tabs At Bedtime 9)  Requip 1 Mg Tabs (Ropinirole Hcl) .... Take 1 Tablets At Bedtime 10)  Microspacer  Misc (Spacer/aero-Holding [place]) .... For Use With Mdi 11)  Nicotrol 10 Mg Inha (Nicotine) .... Use 5 - 6 Times Daily  Allergies (verified): No Known Drug Allergies  Social History: Smoking Status:  quit   Complete Medication List: 1)  Combivent 103-18 Mcg/act Aero (Albuterol-ipratropium) .... Take 2 puffs every 4-6 hours as needed 2)  Omeprazole 20 Mg Cpdr (Omeprazole) .... Take one tab daily 3)  Phenobarbital 64.8  Mg Tabs (Phenobarbital) .... Take 3 tabs at bedtime 4)  Flonase 50 Mcg/act Susp (Fluticasone propionate) .... Spray 1 puff into both nostrils once a day 5)  Simvastatin 40 Mg Tabs (Simvastatin) .... Take 1 tablet by mouth at bedtime 6)  Alprazolam 0.5 Mg Tbdp (Alprazolam) .... Take one tablet two times a day 7)  Vicodin 5-500 Mg Tabs (Hydrocodone-acetaminophen) .... Take one tablet three times a day as needed 8)  Topiramate 50 Mg Tabs (Topiramate) .... Take 2 tabs at bedtime 9)  Requip 1 Mg Tabs (Ropinirole hcl) .... Take 1 tablets at bedtime 10)  Microspacer Misc (Spacer/aero-holding chambers) .... For use with mdi 11)  Nicotrol 10 Mg Inha (Nicotine) .... Use 5 - 6 times daily  Other Orders: [facility]- Est Level  3 (91478)  Patient Instructions: 1)  Please schedule a follow-up appointment in 1 month.  2)  Good luck with your shoulder surgery!  Appended Document: f/u PE eo    Clinical Lists Changes  Observations: Added new observation of MSK EXAM:   right shoulder arc  painful  at 45 degrees.  Tender sub acromial area and ant shoulder and biceps tendon.    (05/07/2010 10:43) Added new observation of HEART EXAM: Normal rate and regular rhythm. S1 and S2 normal without gallop, murmur, click, rub or other extra sounds. (05/07/2010  10:43) Added new observation of PEADULT: Zachery Dauer MD ~Lungs`lung exam ~Heart`Heart exam ~Msk`MSK EXAM (05/07/2010 10:43) Added new observation of LUNG EXAM: Normal respiratory effort, chest expands symmetrically. Lungs are clear to auscultation, no crackles or wheezes. (05/07/2010 10:43)       Physical Exam  Lungs:  Normal respiratory effort, chest expands symmetrically. Lungs are clear to auscultation, no crackles or wheezes. Heart:  Normal rate and regular rhythm. S1 and S2 normal without gallop, murmur, click, rub or other extra sounds. Msk:   right shoulder arc  painful  at 45 degrees.  Tender sub acromial area and ant shoulder and biceps tendon.

## 2010-10-18 NOTE — Progress Notes (Signed)
Summary: Abd pain follow-up   Phone Note Outgoing Call   Call placed by: Zachery Dauer MD,  June 15, 2009 9:07 AM Summary of Call: Mother says Richard Glass is better and is out at the store. I said I will call this afternoon with lab results. Initial call taken by: mother  Follow-up for Phone Call        Richard Glass still has decreased appetite and upper abdominal discomfort. Couldn't sleep last night due to leg cramps. No fever or chills. Plan to call in A.M. if not improved so I can examine his abdomen, check weight, etc.  Follow-up by: Zachery Dauer MD,  June 15, 2009 4:52 PM

## 2010-10-18 NOTE — Assessment & Plan Note (Signed)
Summary: LegPainAsthma/wp   Vital Signs:  Patient Profile:   40 Years Old Male Weight:      218 pounds Pulse rate:   90 / minute BP sitting:   124 / 78  (right arm)  Pt. in pain?   yes    Location:   back    Intensity:   6  Vitals Entered By: Arlyss Repress CMA, (April 14, 2007 8:53 AM)              Is Patient Diabetic? No   Serial Vital Signs/Assessments:  Comments: 10:30 AM [facility]flows [id number] By: Arlyss Repress CMA,    PCP:  Zachery Dauer MD  Chief Complaint:  refill meds and f/up back pain.  History of Present Illness: Continues back pain but less than before. Legs and feet hurting more. Legs want to give out, No falls. Numbness L calf. Both feet numb center plantar. Feel like they are Jello but hurts. 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 No loss of posture  Asthma worse recently in evenings Albuterol wuth spacer not working well. Would like another medicine.   Mild heartburn relieved by Prilosec-OTC.    Past Medical History:    MRI LS spine small L5 central disk, no impingement - 04/16/2002    epidural steroids X 3 - 08/16/2002    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    Chest X-ray normal - 05/29/2004    GB ultrasound neg - 05/29/2004    Peak flow range Yellow 300-480 - 06/05/2004    CT - Abdominal & pelvis divertic only - 06/13/2004    TSH normal - 06/26/2004    ETT normal - 11/08/2004    L shoulder steroid injection - 02/11/2006    MRI C-spine  normal - 03/27/2006  Past Surgical History:    craniotomy s/p hit by golf ball - 09/16/1973     nasal fx surgery - 09/17/1995     UPPPm tonsillectomy, septoplasty - 01/15/2000   Family History:    CVD 1st degree < 42 yo - M, lung cancer - F died        Family History Hypertension mother    Family History of CAD Male 1st degree relative <60 Mother  Social History:    Single    disabled due to back pain    Smokes 2-3 cig daily    Rare beer; Drinks large amounts of  sweet iced tea    limited driving   Risk Factors:  Tobacco use:  current    Counseled to quit/cut down tobacco use:  yes    Physical Exam  General:     alert and overweight-appearing.   Head:     Old surgical scar and indentation Lungs:     Normal respiratory effort, chest expands symmetrically. Lungs are clear to auscultation, except bibasilar wheezes. Heart:     Normal rate and regular rhythm. S1 and S2 normal without gallop, murmur, click, rub or other extra sounds. Neurologic:     Postural tremor symmetric all extremities.    Impression & Recommendations:  Problem # 1:  BACK PAIN W/RADIATION, UNSPECIFIED (ICD-724.4) Possible radicular pain. Consider repeat MRI Orders: [facility]- Est  Level 4 ([address])   Problem # 2:  ASTHMA, UNSPECIFIED (ICD-493.90) Apparently symptomatically worse at times, try adding Atrovent His updated medication list for this problem includes:    Combivent 103-18 Mcg/act Aero (Albuterol-ipratropium) .Marland Kitchen... Take 2 puffs every 4-6 hours as needed  Orders: [facility]- Est  Level 4 ([address]214) [facility]- [facility] (94150)   Problem # 3:  OBESITY (ICD-278.00) Assessment: Deteriorated Discussed how abdominal obesity contributes to back pain  Problem # 4:  TOBACCO DEPENDENCE (ICD-305.1) Assessment: Unchanged Discussed importance of quitting even the few cigarettes that he smokes  Problem # 5:  CONVULSIONS, SEIZURES, NOS (ICD-780.39) Assessment: Unchanged Check level. Consider adding a second drug if spells continue and phenobarb therapeutic His updated medication list for this problem includes:    Phenobarbital 64.8 Mg Tabs (Phenobarbital) .Marland Kitchen... Take 3 tabs at bedtime  Orders: [facility]- Est  Level 4 ([address]214) Comp Met-[facility] ([address]-95284) Miscellaneous Lab Charge-[facility] (13244)   Problem # 7:  GASTROESOPHAGEAL REFLUX, NO ESOPHAGITIS (ICD-530.81) Assessment: Improved  His updated medication list for this problem includes:    Prilosec Otc 20 Mg Tbec (Omeprazole magnesium)  .Marland Kitchen... Take one tablet daily  Orders: [facility] ([address]27)   Problem # 8:  LEG PAIN OR KNEE PAIN (ICD-729.5) Apparently radicular Orders: [facility]- Est  Level 4 (25366)   Medications Added to Medication List This Visit: 1)  Lidoderm 5 % Ptch (Lidocaine) .... Apply once daily for 12 hours. max 3 patches at a time 2)  Combivent 103-18 Mcg/act Aero (Albuterol-ipratropium) .... Take 2 puffs every 4-6 hours as needed 3)  Prilosec Otc 20 Mg Tbec (Omeprazole magnesium) .... Take one tablet daily 4)  Phenobarbital 64.8 Mg Tabs (Phenobarbital) .... Take 3 tabs at bedtime   Patient Instructions: 1)  Please schedule a follow-up appointment in 1 month. 2)  Tobacco is very bad for your health and your loved ones! You Should stop smoking!. 3)  Stop Smoking Tips: Choose a Quit date. Cut down before the Quit date. decide what you will do as a substitute when you feel the urge to smoke(gum,toothpick,exercise). 4)  You need to lose weight. Consider a lower calorie diet and regular exercise.    Prescriptions: PHENOBARBITAL 64.8 MG  TABS (PHENOBARBITAL) Take 3 tabs at bedtime  #90 x 12   Entered and Authorized by:   Zachery Dauer MD   Signed by:   Zachery Dauer MD on 04/14/2007   Method used:   Electronically sent to ...       [facility] [address]*       [address]       [place], [place]  [address]       Ph:        Fax:    RxID:   [id number] PRILOSEC OTC 20 MG  TBEC (OMEPRAZOLE MAGNESIUM) Take one tablet daily  #30 x 12   Entered and Authorized by:   Zachery Dauer MD   Signed by:   Zachery Dauer MD on 04/14/2007   Method used:   Electronically sent to ...       [facility] [address]*       [address]       [place], [place]  [address]       Ph:        Fax:    RxID:   [id number] COMBIVENT 103-18 MCG/ACT  AERO (ALBUTEROL-IPRATROPIUM) Take 2 puffs every 4-6 hours as needed  #1 x prn   Entered and Authorized by:   Zachery Dauer MD   Signed by:   Zachery Dauer MD on 04/14/2007   Method used:    Electronically sent to ...       [facility]       [address]       [place], [place]  [address]       Ph:        Fax:    RxID:   [id number] LIDODERM 5 %  PTCH (LIDOCAINE) Apply once daily for 12 hours. Max 3 patches at a time  #1 box x 12   Entered and Authorized by:   Zachery Dauer MD   Signed by:   Zachery Dauer MD on 04/14/2007   Method used:   Electronically sent to ...       [facility] [address]       [address]       [place], [place]  [address]       Ph:        Fax:    RxID:   [id number]       Appended Document: LegPainAsthma/wp            Physical Exam  Neurologic:     Decreased sensation 4th and 5th toes bilaterally SLR causes discomfort to upper calf at 45 degrees bilaterally. Hips and knees motion ok. Pulses normal.   Detailed Back/Spine Exam  Lumbosacral Exam:  Inspection-deformity:    Normal Palpation-spinal tenderness:  Normal Range of Motion:    Forward Flexion:   50 degrees    Hyperextension:   20 degrees    Right Lateral Bend:   15 degrees    Left Lateral Bend:   25 degrees Lying Straight Leg Raise:    Right:  positive at 45 degrees    Left:  positive at 45 degrees Sitting Straight Leg Raise:    Right:  positive at 90 degrees    Left:  positive at 90 degrees [place] Test:    Right:  negative    Left:  negative

## 2010-10-18 NOTE — Miscellaneous (Signed)
Summary: Orders Update  Clinical Lists Changes  Medications: Rx of SIMVASTATIN 40 MG TABS (SIMVASTATIN) Take 1 tablet by mouth at bedtime;  #30 x 3;  Signed;  Entered by: Luretha Murphy NP;  Authorized by: Luretha Murphy NP;  Method used: Electronic    Prescriptions: SIMVASTATIN 40 MG TABS (SIMVASTATIN) Take 1 tablet by mouth at bedtime  #30 x 3   Entered and Authorized by:   Luretha Murphy NP   Signed by:   Luretha Murphy NP on 04/15/2008   Method used:   [facility] ...       [facility]  [address] [id number]       [address]       [place], [place]  [address]       Ph: [phone] or [phone]       Fax: [phone]   RxID:   [id number]

## 2010-10-18 NOTE — Letter (Signed)
Summary: Results Follow-up Letter  [facility]  [address]   [place], [place] [address]   Phone: [phone]  Fax: [phone]    01/16/2009  [id number] [place], [place]  [address]  Dear [name],   The following are the results of your recent test(s):  Patient: Richard Glass  Your potassium is back to normal and your thyroid is normal. Continue to eat some high potassium foods.  Tests: (1) Potassium ([address])   Potassium                 4.1 mEq/L                   3.5-5.3  Tests: (2) TSH (23280)   TSH                       1.946 uIU/mL                0.350-4.500     ***Test methodology is 3rd generation TSH***   Sincerely,  Zachery Dauer MD [facility]           Appended Document: Results Follow-up Letter mailed

## 2010-10-18 NOTE — Letter (Signed)
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 He did say that you had recently increased his Kadian to 2 tabs daily. I gave him a prescription for Vicodin 5-500 tabs #90 with 2 refills with the misunderstanding that you were not prescribing that medication. When I checked the [place] Controlled Drug Prescriber data base today, I see that you have been giving him that prescription, most recently 07/08/08 for 75 tablets, and that my nurse practitioner had prescribed 90 tablets on 07/04/08.   I called him today to inform him that we would no longer be prescribing the Vicodin, and that all narcotics prescriptions should come from the prescriber of his long acting narcotics to prevent and inadvertant overdose of the medications.  Current Medical Problems: 1)  CHEST WALL PAIN, ANTERIOR (ICD-786.52) 2)  ARM PAIN, RIGHT (ICD-729.5) 3)  NEED PROPHYLACTIC VACCINATION&INOCULATION FLU (ICD-V04.81) 4)  ENCOUNTER FOR LONG-TERM USE OF OTHER MEDICATIONS (ICD-V58.69) 5)  INSOMNIA (ICD-780.52) 6)  EXTERNAL OTITIS (ICD-380.10) 7)  VACCINE AGAINST INFLUENZA (ICD-V04.81) 8)  OBESITY (ICD-278.00) 9)  FAMILY HISTORY OF CAD MALE 1ST DEGREE RELATIVE <60 (ICD-V16.49) 10)  FAMILY HISTORY OF CAD MALE 1ST DEGREE RELATIVE <50 (ICD-V17.3) 11)  TOBACCO DEPENDENCE (ICD-305.1) 12)  RHINITIS, ALLERGIC (ICD-477.9) 13)  LEG PAIN OR KNEE PAIN (ICD-729.5) 14)  HYPERLIPIDEMIA (ICD-272.4) 15)  GASTROESOPHAGEAL REFLUX, NO ESOPHAGITIS (ICD-530.81) 16)  CONVULSIONS, SEIZURES, NOS  (ICD-780.39) 17)  BACK PAIN W/RADIATION, UNSPECIFIED (ICD-724.4) 18)  ASTHMA, UNSPECIFIED (ICD-493.90)   Current Medications: 1)  LIDODERM 5 %  PTCH (LIDOCAINE) Apply once daily for 12 hours. Max 3 patches at a time 2)  COMBIVENT 103-18 MCG/ACT  AERO (ALBUTEROL-IPRATROPIUM) Take 2 puffs every 4-6 hours as needed 3)  PRILOSEC OTC 20 MG  TBEC (OMEPRAZOLE MAGNESIUM) Take one tablet daily 4)  PHENOBARBITAL 64.8 MG  TABS (PHENOBARBITAL) Take 3 tabs at bedtime 5)  FLONASE 50 MCG/ACT SUSP (FLUTICASONE PROPIONATE) Spray 1 puff into both nostrils once a day 6)  KADIAN 50 MG CP24 (MORPHINE SULFATE) Take 1 capsule by mouth every 24 hours 7)  LYRICA 75 MG CAPS (PREGABALIN) Take 1 capsule by mouth three times a day 8)  SIMVASTATIN 40 MG TABS (SIMVASTATIN) Take 1 tablet by mouth at bedtime 9)  CIPRO HC 0.2-1 %  SUSP (CIPROFLOXACIN-HYDROCORTISONE) Instill 3 drops in affected ear two times a day for 7 days 10)  ALPRAZOLAM 0.5 MG  TBDP (ALPRAZOLAM) Take one tablet two times a day 11)  REQUIP 1 MG  TABS (ROPINIROLE HCL) Take 2 tabs at bedtime 12)  VICODIN 5-500 MG TABS (HYDROCODONE-ACETAMINOPHEN) Take one tablet three times a day as needed 13)  DICLOFENAC SODIUM 75 MG TBEC (DICLOFENAC SODIUM) Take one tablet two times a day  Past Medical History: 1)  MRI LS spine small L5 central disk, no impingement - 04/16/2002 2)  epidural steroids X 3 - 08/16/2002 3)  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 4)  Chest X-ray normal - 05/29/2004 5)  GB ultrasound neg - 05/29/2004 6)  Peak flow range Yellow 300-480 - 06/05/2004 7)  CT - Abdominal &  pelvis divertic only - 06/13/2004 8)  TSH normal - 06/26/2004 9)  ETT normal - 11/08/2004 10)  L shoulder steroid injection - 02/11/2006 11)  MRI C-spine  normal - 03/27/2006  Patient: Richard Glass    Order Note: FASTING   WBC                  [H]  12.6 K/uL                   4.0-10.5   RBC                       5.31 MIL/uL                 4.22-5.81   Hemoglobin                 16.0 g/dL                   04.5-40.9   Hematocrit                49.2 %                      39.0-52.0   MCV                       92.7 fL                     78.0-100.0   MCHC                      32.5 g/dL                   81.1-91.4   RDW                       14.2 %                      11.5-15.5   Platelet Count            305 K/uL                    150-400  Tests: (2) Comprehensive Metabolic Panel (78295)   Sodium                    138 mEq/L                   135-145   Potassium                 4.4 mEq/L                   3.5-5.3   Chloride                  103 mEq/L                   96-112   CO2                       24 mEq/L                    19-32   Glucose                   92 mg/dL  70-99   BUN                       6 mg/dL                     1-61   Creatinine                0.72 mg/dL                  0.40-1.50   Bilirubin, Total          0.3 mg/dL                   0.9-6.0   Alkaline Phosphatase      104 U/L                     39-117   AST/SGOT                  13 U/L                      0-37   ALT/SGPT                  12 U/L                      0-53   Total Protein             7.2 g/dL                    4.5-4.0   Albumin                   4.5 g/dL                    9.8-1.1   Calcium                   9.4 mg/dL                   9.1-47.8  Tests: (3) Lipid Profile (29562)   Cholesterol               159 mg/dL                   1-308     ATP III Classification:           < 200        mg/dL        Desirable          200 - 239     mg/dL        Borderline High          >= 240        mg/dL        High         Triglyceride              131 mg/dL                   <657   HDL Cholesterol      [L]  35 mg/dL                    >84   Total Chol/HDL Ratio      4.5 Ratio  VLDL Cholesterol (Calc)  26 mg/dL                    1-61  LDL Cholesterol (Calc)                             98 mg/dL                    0-96  Tests: (4)  Phenobarbital (04540)   Phenobarbital             20.8 ug/mL                  15.0-40.0  Document Creation Date: 03/23/2008 2:36 AM _______________________________________________________________________  Thank you for caring for this patient; please contact us if you have any further questions or need additional information.  Sincerely,  Zachery Dauer MD  Appended Document: *Referral Letter faxed

## 2010-10-18 NOTE — Assessment & Plan Note (Signed)
Summary: med refill/tlb   Vital Signs:  Patient profile:   40 year old male Height:      68 inches Weight:      172 pounds BMI:     26.25 Temp:     98.4 degrees F oral Pulse rate:   87 / minute BP sitting:   132 / 80  (left arm) Cuff size:   regular  Vitals Entered By: Jimmy Footman, CMA (May 22, 2010 3:04 PM) CC: Rx refill, Back Pain Is Patient Diabetic? No Pain Assessment Patient in pain? no        Primary Care Provider:  Zachery Dauer MD  CC:  Rx refill and Back Pain.  History of Present Illness: Has been working some, painting. Back pain varies, patches help some.   No recent seizure   Is getting PT for right shoulder after surgery. Dr Lajoyce Corners will recheck in 6 weeks after 2 week post op check. Not getting pain medication from him. Requests Vicodin refill since he'll be out in a week.   Needs Alprazolam to prevent tremor.   Some allergic rhinitis symptoms. No recent wheezing.   Back Pain History:      The pain is located in the lower back region and does not radiate below the knees.  He states this is not work related.  On a scale of 1-10, he describes the pain as a 5.  He states that he has had a prior history of back pain.  The patient has had a recent course of physical therapy.  The following makes the back pain better: better lying, after pain med.  The following makes the back pain worse: prolonged standing or sitting. .     Habits & Providers  Alcohol-Tobacco-Diet     Tobacco Status: quit  Current Medications (verified): 1)  Combivent 103-18 Mcg/act  Aero (Albuterol-Ipratropium) .... Take 2 Puffs Every 4-6 Hours As Needed 2)  Omeprazole 20 Mg Cpdr (Omeprazole) .... Take One Tab Daily 3)  Phenobarbital 64.8 Mg  Tabs (Phenobarbital) .... Take 3 Tabs At Bedtime 4)  Fluticasone Propionate 50 Mcg/act Susp (Fluticasone Propionate) .... One Spray Each Nostril Daily 5)  Simvastatin 40 Mg Tabs (Simvastatin) .... Take 1 Tablet By Mouth At Bedtime 6)  Alprazolam 0.5  Mg  Tbdp (Alprazolam) .... Take One Tablet Two Times A Day 7)  Vicodin 5-500 Mg Tabs (Hydrocodone-Acetaminophen) .... Take One Tablet Three Times A Day As Needed 8)  Topiramate 100 Mg Tabs (Topiramate) .... Take One Tab At Bedtime 9)  Ropinirole Hcl 2 Mg Tabs (Ropinirole Hcl) .... Take One Tablet At Bedtime 10)  Microspacer  Misc (Spacer/aero-[place]) .... For Use With Mdi 11)  Nicotrol 10 Mg Inha (Nicotine) .... Use 5 - 6 Times Daily  Allergies (verified): No Known Drug Allergies  Physical Exam  General:  alert.  well-nourished.   Lungs:  Normal respiratory effort, chest expands symmetrically. Lungs are clear to auscultation, no crackles or wheezes. Heart:  Normal rate and regular rhythm. S1 and S2 normal without gallop, murmur, click, rub or other extra sounds. Abdomen:  soft and non-tender.   Msk:  right shoulder can only be actively abducted to 75 degrees though he can climb the wall higher. No apparent pain Neurologic:  Tremulous in arms holding postures. Jerky in some movements Psych:  normally interactive and good eye contact.    Low Back Pain Physical Exam:    Inspection-deformity:     No    Palpation-spinal tenderness:  Yes   Shoulder/Elbow Exam  Shoulder Exam:    Right:    Inspection:  Abnormal   Detailed Back/Spine Exam  Lumbosacral Exam:  Inspection-deformity:    Normal Range of Motion:    Forward Flexion:   80 degrees    Hyperextension:   30 degrees    Right Lateral Bend:   35 degrees    Left Lateral Bend:   35 degrees Lying Straight Leg Raise:    Right:  negative    Left:  negative Sitting Straight Leg Raise:    Right:  negative    Left:  negative Patrick's Maneuver:    Right:  negative    Left:  negative [place] Test:    Right:  negative    Left:  negative   Impression & Recommendations:  Problem # 1:  ROTATOR CUFF SYNDROME, RIGHT (ICD-726.10) Significant restriction of abduction. Continues PT Orders: [facility]- Est  Level 4  (91478)  Problem # 2:  BACK PAIN W/RADIATION, UNSPECIFIED (ICD-724.4) No sign of active sciatica. Remains functional His updated medication list for this problem includes:    Vicodin 5-500 Mg Tabs (Hydrocodone-acetaminophen) .Marland Kitchen... Take one tablet three times a day as needed  Problem # 3:  CONVULSIONS, SEIZURES, NOS (ICD-780.39) controlled His updated medication list for this problem includes:    Phenobarbital 64.8 Mg Tabs (Phenobarbital) .Marland Kitchen... Take 3 tabs at bedtime    Topiramate 100 Mg Tabs (Topiramate) .Marland Kitchen... Take one tab at bedtime  Orders: [facility]- Est  Level 4 (99214)  Problem # 4:  INTENTION TREMOR (ICD-333.1) and postural tremor partially improved by medication.   Problem # 5:  OVERWEIGHT (ICD-278.02) Assessment: Improved  Complete Medication List: 1)  Combivent 103-18 Mcg/act Aero (Albuterol-ipratropium) .... Take 2 puffs every 4-6 hours as needed 2)  Omeprazole 20 Mg Cpdr (Omeprazole) .... Take one tab daily 3)  Phenobarbital 64.8 Mg Tabs (Phenobarbital) .... Take 3 tabs at bedtime 4)  Fluticasone Propionate 50 Mcg/act Susp (Fluticasone propionate) .... One spray each nostril daily 5)  Simvastatin 40 Mg Tabs (Simvastatin) .... Take 1 tablet by mouth at bedtime 6)  Alprazolam 0.5 Mg Tbdp (Alprazolam) .... Take one tablet two times a day 7)  Vicodin 5-500 Mg Tabs (Hydrocodone-acetaminophen) .... Take one tablet three times a day as needed 8)  Topiramate 100 Mg Tabs (Topiramate) .... Take one tab at bedtime 9)  Ropinirole Hcl 2 Mg Tabs (Ropinirole hcl) .... Take one tablet at bedtime 10)  Microspacer Misc (Spacer/aero-holding chambers) .... For use with mdi 11)  Nicotrol 10 Mg Inha (Nicotine) .... Use 5 - 6 times daily  Patient Instructions: 1)  Please schedule a follow-up appointment in 6 weeks.  2)  Please return for a FASTING Lipid Profile one(1) week before your next appointment as scheduled.  Prescriptions: VICODIN 5-500 MG TABS (HYDROCODONE-ACETAMINOPHEN) Take one tablet  three times a day as needed  #90 x 3   Entered and Authorized by:   Zachery Dauer MD   Signed by:   Zachery Dauer MD on 05/22/2010   Method used:   Print then Give to Patient   RxID:   [id number] VICODIN 5-500 MG TABS (HYDROCODONE-ACETAMINOPHEN) Take one tablet three times a day as needed  #90 x 3   Entered and Authorized by:   Zachery Dauer MD   Signed by:   Zachery Dauer MD on 05/22/2010   Method used:   Print then Give to Patient   RxID:   [id number] TOPIRAMATE 100 MG TABS (TOPIRAMATE) Take one tab at  bedtime  #30 x 11   Entered and Authorized by:   Zachery Dauer MD   Signed by:   Zachery Dauer MD on 05/22/2010   Method used:   Electronically to        [facility] [id number] (retail)       [address]       [place], [place]  [address]       Ph: [phone]       Fax: [phone]   RxID:   [id number] ROPINIROLE HCL 2 MG TABS (ROPINIROLE HCL) Take one tablet at bedtime  #30 x 11   Entered and Authorized by:   Zachery Dauer MD   Signed by:   Zachery Dauer MD on 05/22/2010   Method used:   Electronically to        [facility] [id number] (retail)       [address]       [place], [place]  [address]       Ph: [phone]       Fax: [phone]   RxID:   [id number]      Prevention & Chronic Care Immunizations   Influenza vaccine: Fluvax Non-MCR  (06/08/2008)   Influenza vaccine due: 06/08/2009    Tetanus booster: 08/02/2008: given   Tetanus booster due: 08/02/2018    Pneumococcal vaccine: Pneumovax  (01/30/2010)  Other Screening   Smoking status: quit  (05/22/2010)  Lipids   Total Cholesterol: 159  (03/22/2008)   LDL: 98  (03/22/2008)   LDL Direct: 95  (07/04/2009)   HDL: 35  (03/22/2008)   Triglycerides: 131  (03/22/2008)   Lipid panel due: 07/21/2009    SGOT (AST): 9  (06/13/2009)   SGPT (ALT): 12  (06/13/2009)   Alkaline phosphatase: 95  (06/13/2009)   Total bilirubin: 0.2  (06/13/2009)    Lipid  flowsheet reviewed?: Yes   Progress toward LDL goal: Unchanged  Self-Management Support :   Personal Goals (by the next clinic visit) :      Personal LDL goal: 130  (06/20/2009)    Lipid self-management support: Not documented

## 2010-10-18 NOTE — Assessment & Plan Note (Signed)
Summary: diarrhea, back pain,df   Vital Signs:  Patient profile:   40 year old male Height:      68 inches Weight:      173.25 pounds BMI:     26.44 Temp:     98.3 degrees F oral Pulse rate:   92 / minute Pulse rhythm:   regular BP sitting:   107 / 72  (right arm)  Vitals Entered By: Modesta Messing LPN (June 13, 2009 2:07 PM) CC: Work in visit.  c/o diarrhea for two weeks. Is Patient Diabetic? No Pain Assessment Patient in pain? no        Primary Care Provider:  Zachery Dauer MD  CC:  Work in visit.  c/o diarrhea for two weeks.Marland Kitchen  History of Present Illness: Two weeks of nausea, vomiting  and diarrhea. bowel movements 8-9 times daily and vomiting up to twice daily. Bilateral upper abd pain in early AM.  Poor appetite. Peanut butter and banana sandwich only today. Periumbilical knot all day. Legs cramp in thighs keeping him awake at night. Has tried Pepto Bismol and Immodium. Help temporarily. No ill contacts. No travel. No recent antibiotics.   Doesn't have Hydrocodone to take for the past week. Wouldn't refill because urine drug test was negative. Still has Kadian left but almost out. Was discharged as a patient.   Using the nose spray. left ear is full feeling and throat burns. Heartburn.   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  Well appearing. Thinner Nose:  Mildly congested Mouth:  Lymphoid hyperplasia Neck:  No deformities, masses, or tenderness noted. Lungs:  Normal respiratory effort, chest expands symmetrically. Lungs are clear to auscultation, no crackles or wheezes. Heart:  Normal rate and regular rhythm. S1 and S2 normal without gallop, murmur, click, rub or other extra sounds. Abdomen:  Mildly tender RLQsoft, no hepatomegaly, and no splenomegaly.   Psych:  dysphoric affect and subdued.     Impression &  Recommendations:  Problem # 1:  DIARRHEA (ICD-787.91) viral gastroenteritis most likely perhaps prolonged by narcotic withdrawal Orders: Comp Met-[facility] (04540-98119) CBC w/Diff-[facility] (14782) [facility]- Est  Level 4 (99214)Future Orders: Stool C-Diff toxin assay- [facility] ([id number]) ... 06/14/2009 Culture, Stool- [facility] [phone]) ... 06/14/2009 Stool, WBC/Lactoferrin-[facility] (84132) ... 06/14/2009  Problem # 2:  BACK PAIN W/RADIATION, UNSPECIFIED (ICD-724.4)  Chronic pain, discharged from the pain clinic for low narcotic level he blames on too small a urine specimen. We sent for records from Dr Riley Kill since the last note is not in [place].  His updated medication list for this problem includes:    Kadian 50 Mg Cp24 (Morphine sulfate) .Marland Kitchen... Take 1 capsule by mouth two times a day    Vicodin 5-500 Mg Tabs (Hydrocodone-acetaminophen) .Marland Kitchen... Take one tablet three times a day as needed  Orders: [facility]- Est  Level 4 (44010)  Problem # 3:  CONVULSIONS, SEIZURES, NOS (ICD-780.39) Assessment: Unchanged  His updated medication list for this problem includes:    Phenobarbital 64.8 Mg Tabs (Phenobarbital) .Marland Kitchen... Take 3 tabs at bedtime    Topiramate 50 Mg Tabs (Topiramate) .Marland Kitchen... Take 2 tabs at bedtime  Problem # 4:  RHINITIS, ALLERGIC (ICD-477.9) May be irritating his throat.  His updated medication list for this problem includes:    Flonase 50 Mcg/act Susp (Fluticasone propionate) ..... Spray 1 puff into both nostrils once a day  Complete Medication List: 1)  Lidoderm 5 % Ptch (Lidocaine) .... Apply once daily for 12 hours. max 3 patches at a time 2)  Combivent 103-18 Mcg/act Aero (Albuterol-ipratropium) .... Take 2 puffs every 4-6 hours as needed 3)  Prilosec Otc 20 Mg Tbec (Omeprazole magnesium) .... Take one tablet daily 4)  Phenobarbital 64.8 Mg Tabs (Phenobarbital) .... Take 3 tabs at bedtime 5)  Flonase 50 Mcg/act Susp (Fluticasone propionate) .... Spray 1 puff into both nostrils once a day 6)  Kadian 50  Mg Cp24 (Morphine sulfate) .... Take 1 capsule by mouth two times a day 7)  Simvastatin 40 Mg Tabs (Simvastatin) .... Take 1 tablet by mouth at bedtime 8)  Alprazolam 0.5 Mg Tbdp (Alprazolam) .... Take one tablet two times a day 9)  Vicodin 5-500 Mg Tabs (Hydrocodone-acetaminophen) .... Take one tablet three times a day as needed 10)  Topiramate 50 Mg Tabs (Topiramate) .... Take 2 tabs at bedtime  Patient Instructions: 1)  Please keep a follow-up appointment in 1 week.  2)  Please bring in the diarrhea stool specimen.  3)  Use Immodium over the counter for the diarrhea.  Prescriptions: VICODIN 5-500 MG TABS (HYDROCODONE-ACETAMINOPHEN) Take one tablet three times a day as needed  #90 x 0   Entered and Authorized by:   Zachery Dauer MD   Signed by:   Zachery Dauer MD on 06/13/2009   Method used:   Print then Give to Patient   RxID:   [id number]

## 2010-10-18 NOTE — Miscellaneous (Signed)
Summary: Meds Contract  Meds Contract   Imported By: De Nurse 07/07/2009 09:02:29  _____________________________________________________________________  External Attachment:    Type:   Image     Comment:   External Document

## 2010-10-18 NOTE — Assessment & Plan Note (Signed)
Summary: f/u eo   Vital Signs:  Patient profile:   40 year old male Height:      68 inches Weight:      173 pounds BMI:     26.40 Temp:     98.6 degrees F oral Pulse rate:   83 / minute BP sitting:   110 / 75  (right arm)  Vitals Entered By: Tessie Fass CMA (August 29, 2009 2:49 PM) CC: F/U Is Patient Diabetic? No Pain Assessment Patient in pain? yes     Location: lower back, shoulders Intensity: 10   Primary Care Provider:  Zachery Dauer MD  CC:  F/U.  History of Present Illness: Working daily. Some sugared drinks.   no longer constipated off Kadian. Diarrhea x 1 every few days. No association  right shoulder improved for a day or two, then started again while working. right shoulder remains painful. At times awakens him so that he cries out. Painful with reaching up. Present 3 months since he pulled a pump out of a well.   Back pain persists.10/10,  left leg sometimes weak. Numb medial left foot at times  Insomnia improved, sleeping 4-5 hours.  Had 2 seizures according to others. Doesn't lose postural tone.   No cigar   Nose congested. Using a humidifier. No recent asthma.   Habits & Providers  Alcohol-Tobacco-Diet     Tobacco Status: quit  Current Medications (verified): 1)  Combivent 103-18 Mcg/act  Aero (Albuterol-Ipratropium) .... Take 2 Puffs Every 4-6 Hours As Needed 2)  Prilosec Otc 20 Mg  Tbec (Omeprazole Magnesium) .... Take One Tablet Daily 3)  Phenobarbital 64.8 Mg  Tabs (Phenobarbital) .... Take 3 Tabs At Bedtime 4)  Flonase 50 Mcg/act Susp (Fluticasone Propionate) .... Spray 1 Puff Into Both Nostrils Once A Day 5)  Simvastatin 40 Mg Tabs (Simvastatin) .... Take 1 Tablet By Mouth At Bedtime 6)  Alprazolam 0.5 Mg  Tbdp (Alprazolam) .... Take One Tablet Two Times A Day 7)  Vicodin 5-500 Mg Tabs (Hydrocodone-Acetaminophen) .... Take One Tablet Three Times A Day As Needed 8)  Topiramate 50 Mg Tabs (Topiramate) .... Take 2 Tabs At Bedtime 9)   Requip 1 Mg Tabs (Ropinirole Hcl) .... Take 2 Tablets At Bedtime 10)  Diclofenac Sodium 75 Mg Tbec (Diclofenac Sodium) .... Take One Tab Two Times A Day  Allergies (verified): No Known Drug Allergies  Physical Exam  General:  Well-developed,well-nourished,in no acute distress; alert,appropriate and cooperative throughout examination. Doesn't appear in pain until he rotates the right shoulder Lungs:  Normal respiratory effort, chest expands symmetrically. Lungs are clear to auscultation, no crackles or wheezes. Heart:  Normal rate and regular rhythm. S1 and S2 normal without gallop, murmur, click, rub or other extra sounds. Msk:  Negative drop test  right shoulder painful arc, tender supraspinatus area. + impingement sign. Non tender biceps and AC joint.  Neurologic:  alert & oriented X3.   Psych:  normally interactive and good eye contact.  Smiling, except during shoulder exam   Impression & Recommendations:  Problem # 1:  ROTATOR CUFF SYNDROME, RIGHT (ICD-726.10) Not improved after injection. Course of Diclofenac Orders: [facility]- Est Level  3 (44010)  Problem # 2:  CHRONIC PAIN SYNDROME (ICD-338.4) Tried to reframe the pain description sinc it seems inconsistent with his level of functioning. Measure UDS to make sure he is taking the Vicodin. Orders: Miscellaneous Lab Charge-[facility] [id number]) [facility]- Est Level  3 (66440)  Problem # 3:  BACK PAIN W/RADIATION, UNSPECIFIED (ICD-724.4) Assessment:  Unchanged But working most days. His updated medication list for this problem includes:    Vicodin 5-500 Mg Tabs (Hydrocodone-acetaminophen) .Marland Kitchen... Take one tablet three times a day as needed    Diclofenac Sodium 75 Mg Tbec (Diclofenac sodium) .Marland Kitchen... Take one tab two times a day  Problem # 4:  CONVULSIONS, SEIZURES, NOS (ICD-780.39) Non recently, on disablilty for this His updated medication list for this problem includes:    Phenobarbital 64.8 Mg Tabs (Phenobarbital) .Marland Kitchen... Take 3 tabs at bedtime     Topiramate 50 Mg Tabs (Topiramate) .Marland Kitchen... Take 2 tabs at bedtime  Problem # 5:  OVERWEIGHT (ICD-278.02) Assessment: Improved  Orders: [facility]- Est Level  3 ([address])  Problem # 6:  DIARRHEA (ICD-787.91) Improved but still intermittent  Complete Medication List: 1)  Combivent 103-18 Mcg/act Aero (Albuterol-ipratropium) .... Take 2 puffs every 4-6 hours as needed 2)  Prilosec Otc 20 Mg Tbec (Omeprazole magnesium) .... Take one tablet daily 3)  Phenobarbital 64.8 Mg Tabs (Phenobarbital) .... Take 3 tabs at bedtime 4)  Flonase 50 Mcg/act Susp (Fluticasone propionate) .... Spray 1 puff into both nostrils once a day 5)  Simvastatin 40 Mg Tabs (Simvastatin) .... Take 1 tablet by mouth at bedtime 6)  Alprazolam 0.5 Mg Tbdp (Alprazolam) .... Take one tablet two times a day 7)  Vicodin 5-500 Mg Tabs (Hydrocodone-acetaminophen) .... Take one tablet three times a day as needed 8)  Topiramate 50 Mg Tabs (Topiramate) .... Take 2 tabs at bedtime 9)  Requip 1 Mg Tabs (Ropinirole hcl) .... Take 2 tablets at bedtime 10)  Diclofenac Sodium 75 Mg Tbec (Diclofenac sodium) .... Take one tab two times a day  Patient Instructions: 1)  Please schedule a follow-up appointment in 3 weeks for recheck of shoulder. Take the Diclofenac daily for 10 days. If your stomach is irritated increase the omeprazole to twice daily  Prescriptions: VICODIN 5-500 MG TABS (HYDROCODONE-ACETAMINOPHEN) Take one tablet three times a day as needed  #90 x 0   Entered and Authorized by:   Zachery Dauer MD   Signed by:   Zachery Dauer MD on 08/29/2009   Method used:   Print then Give to Patient   RxID:   [id number] DICLOFENAC SODIUM 75 MG TBEC (DICLOFENAC SODIUM) Take one tab two times a day  #30 x 0   Entered and Authorized by:   Zachery Dauer MD   Signed by:   Zachery Dauer MD on 08/29/2009   Method used:   Electronically to        [id number] ([facility]       [address]       [place], [place]   [address]       Ph: [phone]       Fax: [phone]   RxID:   [id number]     Prevention & Chronic Care Immunizations   Influenza vaccine: Fluvax Non-MCR  (06/08/2008)   Influenza vaccine due: 06/08/2009    Tetanus booster: 08/02/2008: given   Tetanus booster due: 08/02/2018    Pneumococcal vaccine: Not documented  Other Screening   Smoking status: quit  (08/29/2009)  Lipids   Total Cholesterol: 159  (03/22/2008)   LDL: 98  (03/22/2008)   LDL Direct: 95  (07/04/2009)   HDL: 35  (03/22/2008)   Triglycerides: 131  (03/22/2008)   Lipid panel due: 07/21/2009    SGOT (AST): 9  (06/13/2009)   SGPT (ALT): 12  (06/13/2009)  Alkaline phosphatase: 95  (06/13/2009)   Total bilirubin: 0.2  (06/13/2009)    Lipid flowsheet reviewed?: Yes   Progress toward LDL goal: At goal  Self-Management Support :   Personal Goals (by the next clinic visit) :      Personal LDL goal: 130  (06/20/2009)    Lipid self-management support: Not documented

## 2010-10-18 NOTE — Assessment & Plan Note (Signed)
Summary: f/u fall/eo   Vital Signs:  Patient Profile:   40 Years Old Male Weight:      200.3 pounds Temp:     98.2 degrees F Pulse rate:   74 / minute BP sitting:   104 / 70  (left arm)  Vitals Entered By: Theresia Lo RN (February 18, 2008 8:46 AM)             Is Patient Diabetic? No     PCP:  Zachery Dauer MD  Chief Complaint:  fell down steps last week and painful hips.  History of Present Illness: 40 yo man w/ chronic LBP who fell down 15 steps 1 week ago and landed on coccyx on cinder block floor.  Initially pain wasn't bad but has been worsening.  No pain in hips, pain in low back unchanged, no difficulty w/ bowel or bladder continence.  Sensation intact.  No fevers.  Pt takes chronic narcotics from pain clinic and Dr Sheffield Slider- asking for refill of Vicodin.  Not taking any anti-inflammatories or using heat or ice.  Pain is worse w/ prolonged sitting or standing.  Lying down provides some relief as long as he's lying on his side.       Review of Systems      See HPI   Physical Exam  General:     Well-developed,well-nourished,in no acute distress; alert,appropriate and cooperative throughout examination Msk:     No deformity or scoliosis noted of thoracic or lumbar spine.  No paraspinal tenderness.  Pain w/ palpation of coccyx.  FROM of hips. Neurologic:     Patellar reflexes +2 and symmetric.  LE sensation intact    Impression & Recommendations:  Problem # 1:  ACCIDENTAL FALL (ICD-E888.9) Assessment: New Pt w/ pain in coccyx s/p fall down flight of stairs.  No evidence of hip injury on exam.  LBP unchanged from chronic.  Will add NSAID as pt is not taking one and will also provide limited refill of pt's vicodin until he can see Dr Sheffield Slider.  Gave pt script for butt donut.  Advised pt this will take weeks to heal.  He expressed understanding.  No imaging needed at this time. Orders: [facility]- Est Level  3 (99213)   Complete Medication List: 1)  Lidoderm 5 % Ptch (Lidocaine)  .... Apply once daily for 12 hours. max 3 patches at a time 2)  Combivent 103-18 Mcg/act Aero (Albuterol-ipratropium) .... Take 2 puffs every 4-6 hours as needed 3)  Prilosec Otc 20 Mg Tbec (Omeprazole magnesium) .... Take one tablet daily 4)  Phenobarbital 64.8 Mg Tabs (Phenobarbital) .... Take 3 tabs at bedtime 5)  Flonase 50 Mcg/act Susp (Fluticasone propionate) .... Spray 1 puff into both nostrils once a day 6)  Kadian 50 Mg Cp24 (Morphine sulfate) .... Take 1 capsule by mouth every 24 hours 7)  Lyrica 75 Mg Caps (Pregabalin) .... Take 1 capsule by mouth three times a day 8)  Simvastatin 40 Mg Tabs (Simvastatin) .... Take 1 tablet by mouth at bedtime 9)  Vicodin 5-500 Mg Tabs (Hydrocodone-acetaminophen) .... Take 1 tablet by mouth three times a day 10)  Cipro Hc 0.2-1 % Susp (Ciprofloxacin-hydrocortisone) .... Instill 3 drops in affected ear two times a day for 7 days 11)  Naprosyn 500 Mg Tabs (Naproxen) .Marland Kitchen.. 1 tab by mouth two times a day x 10 days and then as needed   Patient Instructions: 1)  Follow up with Dr Sheffield Slider sometime this month 2)  You have  30 pills of Vicodin and the Naprosyn prescription waiting for you at the pharmacy 3)  Apply ice and/or heat to the area that hurts twice daily 4)  Try and stay as active as possible to avoid getting stiff 5)  Use the butt donut for comfort   Prescriptions: VICODIN 5-500 MG TABS (HYDROCODONE-ACETAMINOPHEN) Take 1 tablet by mouth three times a day  #30 x 0   Entered and Authorized by:   Neena Rhymes  MD   Signed by:   Neena Rhymes  MD on 02/18/2008   Method used:   Faxed to ...       [id number]       [address]       [place], [place]  [address]       Ph: [phone] or [phone]       Fax: [phone]   RxID:   [id number] NAPROSYN 500 MG  TABS (NAPROXEN) 1 tab by mouth two times a day x 10 days and then as needed  #30 x 0   Entered and Authorized by:   Neena Rhymes  MD   Signed by:   Neena Rhymes  MD on 02/18/2008   Method used:   Electronically sent to ...       [id number]       [address]       [place], [place]  [address]       Ph: [phone] or [phone]       Fax: [phone]   RxID:   [id number] VICODIN 5-500 MG TABS (HYDROCODONE-ACETAMINOPHEN) Take 1 tablet by mouth three times a day  #30 x 0   Entered and Authorized by:   Neena Rhymes  MD   Signed by:   Neena Rhymes  MD on 02/18/2008   Method used:   Faxed to ...       [address]       [address]       [place], [place]  [address]       Ph: [phone]       Fax: [phone]   RxID:   [id number]  ]  Appended Document: f/u Vicondin rx eo    Phone Note Outgoing Call   Call placed by: Zachery Dauer MD,  August 03, 2008 4:43 PM Summary of Call: I checked the [facility] prescriber data base and found that he has been getting prescriptions filled for hydrocodone/APAP from both me and Dr  Riley Kill. I called him and he said he's been needing 4 tabs daily. My rough count shows that he's received 1855 tabs this year which is more like 6 tabs daily. I informed him I would no longer prescribe this medicine for him since he is receiving Kadian from Dr Riley Kill. I will send a letter to Dr Riley Kill informing him of my concern and the last prescription which is refillable x 2.

## 2010-10-18 NOTE — Letter (Signed)
Summary: Results Follow-up Letter  [facility]  [address]   [place], [place] [address]   Phone: [phone]  Fax: [phone]    03/23/2008  [id number] [place], [place]  [address]  Dear Mr. [name], Your results are good, so continue your medications the same.    The following are the results of your recent test(s): Patient: Richard Glass  Tests: (1) CBC NO Diff (Complete Blood Count) (10000)   Order Note: FASTING   WBC                  [H]  12.6 K/uL                   4.0-10.5   RBC                       5.31 MIL/uL                 4.22-5.81   Hemoglobin                16.0 g/dL                   69.6-29.5   Hematocrit                49.2 %                      39.0-52.0   MCV                       92.7 fL                     78.0-100.0   MCHC                      32.5 g/dL                   28.4-13.2   RDW                       14.2 %                      11.5-15.5   Platelet Count            305 K/uL                    150-400  Tests: (2) Comprehensive Metabolic Panel (44010)   Sodium                    138 mEq/L                   135-145   Potassium                 4.4 mEq/L                   3.5-5.3   Chloride                  103 mEq/L                   96-112   CO2                       24 mEq/L  19-32   Glucose                   92 mg/dL                    13-08   BUN                       6 mg/dL                     6-57   Creatinine                0.72 mg/dL                  0.40-1.50   Bilirubin, Total          0.3 mg/dL                   8.4-6.9   Alkaline Phosphatase      104 U/L                     39-117   AST/SGOT                  13 U/L                      0-37   ALT/SGPT                  12 U/L                      0-53   Total Protein             7.2 g/dL                    6.2-9.5   Albumin                   4.5 g/dL                    2.8-4.1   Calcium                   9.4 mg/dL                    3.2-44.0  Tests: (3) Lipid Profile (10272)   Cholesterol               159 mg/dL                   5-366     ATP III Classification:           < 200        mg/dL        Desirable          200 - 239     mg/dL        Borderline High          >= 240        mg/dL        High         Triglyceride              131 mg/dL                   <440   HDL Cholesterol      [L]  35 mg/dL                    >  39   Total Chol/HDL Ratio      4.5 Ratio  VLDL Cholesterol (Calc)                             26 mg/dL                    0-45  LDL Cholesterol (Calc)                             98 mg/dL                    4-09           Total Cholesterol/HDL Ratio:CHD Risk                            Coronary Heart Disease Risk Table                                            Men       Women              1/2 Average Risk              3.4        3.3                  Average Risk              5.0        4.4              2 X Average Risk              9.6        7.1              3 X Average Risk             23.4       11.0     Use the calculated Patient Ratio above and the CHD Risk table      to determine the patient's CHD Risk.     ATP III Classification (LDL):           < 100        mg/dL         Optimal          100 - 129     mg/dL         Near or Above Optimal          130 - 159     mg/dL         Borderline High          160 - 189     mg/dL         High           > 190        mg/dL         Very High        Tests: (4) Phenobarbital (81191)   Phenobarbital             20.8 ug/mL                  15.0-40.0  Document Creation Date: 03/23/2008 2:36  AM ____________________________________________  Sincerely,  Zachery Dauer MD [facility]           Appended Document: Results Follow-up Letter Letter sent via mail to pt ..................Marland KitchenDelores Pate-Gaddy, CMA (AAMA)

## 2010-10-18 NOTE — Progress Notes (Signed)
Summary: needs PA  Phone Note Call from Patient Call back at Home Phone [phone]   Caller: Patient Summary of Call: was informed yesterday and needs an approval from Medicare to get CT done. Initial call taken by: De Nurse,  June 20, 2009 8:47 AM  Follow-up for Phone Call        Obtained and called to [facility] Follow-up by: Starleen Blue RN,  June 20, 2009 11:29 AM

## 2010-10-18 NOTE — Miscellaneous (Signed)
Summary: med refill  Clinical Lists Changes Refilled via fax request.  Seems reasonable to refill now.  I did not put in additional refills because I do not know the long term plan in Dr. Martin Majestic absence.  Doralee Albino MD  Jan 22, 2010 5:00 PM  Medications: Rx of VICODIN 5-500 MG TABS (HYDROCODONE-ACETAMINOPHEN) Take one tablet three times a day as needed;  #90 x 0;  Signed;  Entered by: Doralee Albino MD;  Authorized by: Doralee Albino MD;  Method used: Handwritten    Prescriptions: VICODIN 5-500 MG TABS (HYDROCODONE-ACETAMINOPHEN) Take one tablet three times a day as needed  #90 x 0   Entered and Authorized by:   Doralee Albino MD   Signed by:   Doralee Albino MD on 01/22/2010   Method used:   Handwritten   RxID:   [id number]

## 2010-10-18 NOTE — Assessment & Plan Note (Signed)
Summary: f/u meds,seizures,df   Vital Signs:  Patient profile:   40 year old male Height:      68 inches Weight:      176.31 pounds BMI:     26.90 Temp:     97.9 degrees F oral Pulse rate:   87 / minute BP sitting:   124 / 78  (left arm)  Vitals Entered By: deseree blount/sma CC: follow up on meds Is Patient Diabetic? No Pain Assessment Patient in pain? yes        History of Present Illness: He hasn't had any definite seizures, but feels like he is going to freeze up at times. No recent neurology visits. Driving without problems.  Saw the pain center PA last month and will see Dr Hermelinda Medicus next month. His meds were changed several months ago. Med list is now updated   Still smoking 1 PPD. Contemplating quitting  Habits & Providers     Tobacco Status: current  Social History:    Smoking Status:  current  Physical Exam  General:  alert.  Well appearing. Thinner Lungs:  Normal respiratory effort, chest expands symmetrically. Lungs are clear to auscultation, no crackles or wheezes. Heart:  Normal rate and regular rhythm. S1 and S2 normal without gallop, murmur, click, rub or other extra sounds. Neurologic:  Mild postural tremor.alert & oriented X3, cranial nerves II-XII intact, strength normal in all extremities, gait normal, finger-to-nose normal, heel-to-shin mildly clumbsy and Romberg negative.   Skin:  Tanned Psych:  normally interactive, good eye contact, not anxious appearing, and not depressed appearing.  Mentally sharp   Impression & Recommendations:  Problem # 1:  CONVULSIONS, SEIZURES, NOS (ICD-780.39) Phenobarb level ordered since last dose last night. The following medications were removed from the medication list:    Lyrica 75 Mg Caps (Pregabalin) .Marland Kitchen... Take 1 capsule by mouth three times a day His updated medication list for this problem includes:    Phenobarbital 64.8 Mg Tabs (Phenobarbital) .Marland Kitchen... Take 3 tabs at bedtime    Topiramate 50 Mg Tabs  (Topiramate) .Marland Kitchen... Take 2 tabs at bedtime  Orders: Miscellaneous Lab Charge-[facility] (02725)  Problem # 2:  OBESITY (ICD-278.00) Assessment: Improved  Problem # 3:  TOBACCO DEPENDENCE (ICD-305.1) Contemplating quitting again  Problem # 4:  BACK PAIN W/RADIATION, UNSPECIFIED (ICD-724.4) Chronic. Treated at [facility] pain clinic The following medications were removed from the medication list:    Diclofenac Sodium 75 Mg Tbec (Diclofenac sodium) .Marland Kitchen... Take one tablet two times a day His updated medication list for this problem includes:    Kadian 50 Mg Cp24 (Morphine sulfate) .Marland Kitchen... Take 1 capsule by mouth two times a day    Vicodin 5-500 Mg Tabs (Hydrocodone-acetaminophen) .Marland Kitchen... Take one tablet three times a day as needed  Problem # 5:  GASTROESOPHAGEAL REFLUX, NO ESOPHAGITIS (ICD-530.81) Assessment: Improved  His updated medication list for this problem includes:    Prilosec Otc 20 Mg Tbec (Omeprazole magnesium) .Marland Kitchen... Take one tablet daily  Complete Medication List: 1)  Lidoderm 5 % Ptch (Lidocaine) .... Apply once daily for 12 hours. max 3 patches at a time 2)  Combivent 103-18 Mcg/act Aero (Albuterol-ipratropium) .... Take 2 puffs every 4-6 hours as needed 3)  Prilosec Otc 20 Mg Tbec (Omeprazole magnesium) .... Take one tablet daily 4)  Phenobarbital 64.8 Mg Tabs (Phenobarbital) .... Take 3 tabs at bedtime 5)  Flonase 50 Mcg/act Susp (Fluticasone propionate) .... Spray 1 puff into both nostrils once a day 6)  Kadian 50 Mg Cp24 (  Morphine sulfate) .... Take 1 capsule by mouth two times a day 7)  Simvastatin 40 Mg Tabs (Simvastatin) .... Take 1 tablet by mouth at bedtime 8)  Alprazolam 0.5 Mg Tbdp (Alprazolam) .... Take one tablet two times a day 9)  Vicodin 5-500 Mg Tabs (Hydrocodone-acetaminophen) .... Take one tablet three times a day as needed 10)  Topiramate 50 Mg Tabs (Topiramate) .... Take 2 tabs at bedtime  Other Orders: Comp Met-[facility] ([id number]) CBC-[facility] (48546) [facility]- Est  Level 4  (27[address])  Patient Instructions: 1)  Please schedule a follow-up appointment in 3 months .  2)  Please return for a FASTING Lipid Profile one(1) week before your next appointment as scheduled.  3)  I will call with lab results if they are abnormal

## 2010-10-18 NOTE — Consult Note (Signed)
Summary: [facility] plans MRI  [facility]   Imported By: Clydell Hakim 02/15/2010 15:26:28  _____________________________________________________________________  External Attachment:    Type:   Image     Comment:   External Document

## 2010-10-18 NOTE — Miscellaneous (Signed)
Summary: medUpdate  Medications Added FLONASE 50 MCG/ACT SUSP (FLUTICASONE PROPIONATE) Spray 1 puff into both nostrils once a day KADIAN 50 MG CP24 (MORPHINE SULFATE) Take 1 capsule by mouth every 24 hours LYRICA 75 MG CAPS (PREGABALIN) Take 1 capsule by mouth three times a day SIMVASTATIN 40 MG TABS (SIMVASTATIN) Take 1 tablet by mouth at bedtime VICODIN 5-500 MG TABS (HYDROCODONE-ACETAMINOPHEN) Take 1 tablet by mouth three times a day       Clinical Lists Changes  Medications: Added new medication of FLONASE 50 MCG/ACT SUSP (FLUTICASONE PROPIONATE) Spray 1 puff into both nostrils once a day Added new medication of KADIAN 50 MG CP24 (MORPHINE SULFATE) Take 1 capsule by mouth every 24 hours Added new medication of LYRICA 75 MG CAPS (PREGABALIN) Take 1 capsule by mouth three times a day Added new medication of SIMVASTATIN 40 MG TABS (SIMVASTATIN) Take 1 tablet by mouth at bedtime Added new medication of VICODIN 5-500 MG TABS (HYDROCODONE-ACETAMINOPHEN) Take 1 tablet by mouth three times a day Observations: Added new observation of LLIMPORTMEDS: completed (04/15/2007 14:04)

## 2010-10-18 NOTE — Assessment & Plan Note (Signed)
Summary: f/up meds,tcb   Vital Signs:  Patient profile:   40 year old male Height:      68 inches Weight:      176.0 pounds BMI:     26.86 Temp:     97.8 degrees F Pulse rate:   83 / minute BP sitting:   127 / 77  (right arm)  Vitals Entered By: Starleen Blue RN 06/20/2009 CC: f/u Is Patient Diabetic? No Pain Assessment Patient in pain? yes     Location: back Intensity: 9   Primary Care Provider:  Zachery Dauer MD  CC:  f/u.  History of Present Illness: Continues diarrhea and abdominal pain but latter less. Is taking hydrocodone/APAP three times a day. Hasn't taken Kadian morphine for over a week.   Report of abdomen and pelvic CT showed only diverticulosis  Habits & Providers  Alcohol-Tobacco-Diet     Tobacco Status: quit  Current Medications (verified): 1)  Combivent 103-18 Mcg/act  Aero (Albuterol-Ipratropium) .... Take 2 Puffs Every 4-6 Hours As Needed 2)  Prilosec Otc 20 Mg  Tbec (Omeprazole Magnesium) .... Take One Tablet Daily 3)  Phenobarbital 64.8 Mg  Tabs (Phenobarbital) .... Take 3 Tabs At Bedtime 4)  Flonase 50 Mcg/act Susp (Fluticasone Propionate) .... Spray 1 Puff Into Both Nostrils Once A Day 5)  Simvastatin 40 Mg Tabs (Simvastatin) .... Take 1 Tablet By Mouth At Bedtime 6)  Alprazolam 0.5 Mg  Tbdp (Alprazolam) .... Take One Tablet Two Times A Day 7)  Vicodin 5-500 Mg Tabs (Hydrocodone-Acetaminophen) .... Take One Tablet Three Times A Day As Needed 8)  Topiramate 50 Mg Tabs (Topiramate) .... Take 2 Tabs At Bedtime 9)  Requip 1 Mg Tabs (Ropinirole Hcl) .... Take 2 Tablets At Bedtime  Allergies (verified): No Known Drug Allergies  Social History: Smoking Status:  quit  Physical Exam  Lungs:  Normal respiratory effort, chest expands symmetrically. Lungs are clear to auscultation, no crackles or wheezes. Heart:  Normal rate and regular rhythm. S1 and S2 normal without gallop, murmur, click, rub or other extra sounds. Abdomen:  Mildly tender RLQ to  suprapubic area. soft, no hepatomegaly, and no splenomegaly.  No rebound tenderness, Neurologic:  alert & oriented X3.  Mildly slurred speech as usual   Impression & Recommendations:  Problem # 1:  ABDOMINAL PAIN, PERIUMBILIC (ICD-789.05) tests all negative and he has not lost weight and is functioning with it. May partially be from narcotic withdrawal Orders: [facility]- Est Level  3 (14782)  Problem # 2:  LEUKOCYTOSIS UNSPECIFIED (ICD-288.60) No other systemic signs. Has mild sinus symptoms left maxillary area and had leukocytosis with a sinus infection about 4 years ago. I'm avoiding antibiotics due to the diarrhea.  Orders: [facility]- Est Level  3 (95621)  Problem # 3:  BACK PAIN W/RADIATION, UNSPECIFIED (ICD-724.4) He has enough vicodin to last till the next visit. I will do a pain contract and UDS next visit. If no narcotics in urine I will discontinue prescription due to concern that he is diverting the medication. The state narcotic registry report showed only the expected refills.  The following medications were removed from the medication list:    Kadian 50 Mg Cp24 (Morphine sulfate) .Marland Kitchen... Take 1 capsule by mouth two times a day    Ibuprofen 600 Mg Tabs (Ibuprofen) .Marland Kitchen... Take one tab every 8 hr with food as needed pain His updated medication list for this problem includes:    Vicodin 5-500 Mg Tabs (Hydrocodone-acetaminophen) .Marland Kitchen... Take one tablet three times  a day as needed  Orders: [facility]- Est Level  3 (99213)  Complete Medication List: 1)  Combivent 103-18 Mcg/act Aero (Albuterol-ipratropium) .... Take 2 puffs every 4-6 hours as needed 2)  Prilosec Otc 20 Mg Tbec (Omeprazole magnesium) .... Take one tablet daily 3)  Phenobarbital 64.8 Mg Tabs (Phenobarbital) .... Take 3 tabs at bedtime 4)  Flonase 50 Mcg/act Susp (Fluticasone propionate) .... Spray 1 puff into both nostrils once a day 5)  Simvastatin 40 Mg Tabs (Simvastatin) .... Take 1 tablet by mouth at bedtime 6)  Alprazolam 0.5 Mg  Tbdp (Alprazolam) .... Take one tablet two times a day 7)  Vicodin 5-500 Mg Tabs (Hydrocodone-acetaminophen) .... Take one tablet three times a day as needed 8)  Topiramate 50 Mg Tabs (Topiramate) .... Take 2 tabs at bedtime 9)  Requip 1 Mg Tabs (Ropinirole hcl) .... Take 2 tablets at bedtime  Patient Instructions: 1)  Please schedule a follow-up appointment in 2 weeks.  2)  Recheck sooner if fever or symptoms of infection. 3)  Take the immodium as needed for diarrhea.      CT Abdomen/Pelvis  Procedure date:  06/20/2009  Findings:      normal:  except for diverticulosis   CT Abdomen/Pelvis  Procedure date:  06/20/2009  Findings:      normal:  except for diverticulosis          Prevention & Chronic Care Immunizations   Influenza vaccine: Fluvax Non-MCR  (06/08/2008)   Influenza vaccine due: 06/08/2009    Tetanus booster: 08/02/2008: given   Tetanus booster due: 08/02/2018    Pneumococcal vaccine: Not documented  Other Screening   Smoking status: quit  (06/20/2009)  Lipids   Total Cholesterol: 159  (03/22/2008)   LDL: 98  (03/22/2008)   LDL Direct: Not documented   HDL: 35  (03/22/2008)   Triglycerides: 131  (03/22/2008)   Lipid panel due: 07/21/2009    SGOT (AST): 9  (06/13/2009)   SGPT (ALT): 12  (06/13/2009)   Alkaline phosphatase: 95  (06/13/2009)   Total bilirubin: 0.2  (06/13/2009)    Lipid flowsheet reviewed?: Yes   Progress toward LDL goal: At goal  Self-Management Support :   Personal Goals (by the next clinic visit) :      Personal LDL goal: 130  (06/20/2009)    Lipid self-management support: Not documented

## 2010-10-18 NOTE — Miscellaneous (Signed)
Summary: asthma classification  Clinical Lists Changes  Problems: Removed problem of ASTHMA, UNSPECIFIED (ICD-493.90) Added new problem of ASTHMA, INTERMITTENT (ICD-493.90)

## 2010-10-18 NOTE — Assessment & Plan Note (Signed)
Summary: f/up,tcb   Vital Signs:  Patient profile:   40 year old male Height:      68 inches Weight:      175 pounds BMI:     26.70 Pulse rate:   71 / minute BP sitting:   126 / 84  (left arm) Cuff size:   regular  Vitals Entered By: Arlyss Repress CMA, (Jan 30, 2010 1:31 PM) CC: f/up shoulder pain. refill meds. Is Patient Diabetic? No Pain Assessment Patient in pain? yes     Location: right shoulder Intensity: 7 Onset of pain  Chronic   Primary Care Provider:  Zachery Dauer MD  CC:  f/up shoulder pain. refill meds..  History of Present Illness: right arm and hand go numb when he is lying in bed. Sleeps on his back or left side. Flexing the right arm wakes it up.   shoulder pain 7/10 and back hurting 4/10 radiating into left leg. Most painful when he is lifting something or holding it with his right arm.  Pain left shoulder 6/10 when he moves it. Strength is decreased. Vicodin filled today since I recommended he continue that.   Staying with  brother, Dorinda Hill, next 2-3 weeks , severely injured face, ribs and L leg, started walking in PT.    smoking again 1/2 PPD. He is interested in quitting. Did get the nicotine replacement that I prescribed.   Standing nerves jumping in his legs   Habits & Providers  Alcohol-Tobacco-Diet     Tobacco Status: current     Tobacco Counseling: to quit use of tobacco products  Current Medications (verified): 1)  Combivent 103-18 Mcg/act  Aero (Albuterol-Ipratropium) .... Take 2 Puffs Every 4-6 Hours As Needed 2)  Omeprazole 20 Mg Cpdr (Omeprazole) .... Take One Tab Daily 3)  Phenobarbital 64.8 Mg  Tabs (Phenobarbital) .... Take 3 Tabs At Bedtime 4)  Flonase 50 Mcg/act Susp (Fluticasone Propionate) .... Spray 1 Puff Into Both Nostrils Once A Day 5)  Simvastatin 40 Mg Tabs (Simvastatin) .... Take 1 Tablet By Mouth At Bedtime 6)  Alprazolam 0.5 Mg  Tbdp (Alprazolam) .... Take One Tablet Two Times A Day 7)  Vicodin 5-500 Mg Tabs  (Hydrocodone-Acetaminophen) .... Take One Tablet Three Times A Day As Needed 8)  Topiramate 50 Mg Tabs (Topiramate) .... Take 2 Tabs At Bedtime 9)  Requip 1 Mg Tabs (Ropinirole Hcl) .... Take 2 Tablets At Bedtime 10)  Microspacer  Misc (Spacer/aero-Holding Fruitridge Pocket) .... For Use With Mdi 11)  Nicotrol 10 Mg Inha (Nicotine) .... Use 5 - 6 Times Daily  Allergies (verified): No Known Drug Allergies  Physical Exam  General:  alert.  well-nourished.   Neck:  Head forward position. No radiating pain with extension and rotation.  Lungs:  Normal respiratory effort, chest expands symmetrically. Lungs are clear to auscultation, no crackles or wheezes. Heart:  Normal rate and regular rhythm. S1 and S2 normal without gallop, murmur, click, rub or other extra sounds. Msk:  Negative drop test. Neg impingement sign  right shoulder arc  painful  at 45 degrees.  Tender sub acromial area and ant shoulder and biceps tendon.   Sitting root test doesn't cause pain  Neurologic:  Tremulous in arms holding postures..    Complete Medication List: 1)  Combivent 103-18 Mcg/act Aero (Albuterol-ipratropium) .... Take 2 puffs every 4-6 hours as needed 2)  Omeprazole 20 Mg Cpdr (Omeprazole) .... Take one tab daily 3)  Phenobarbital 64.8 Mg Tabs (Phenobarbital) .... Take 3 tabs at bedtime  4)  Flonase 50 Mcg/act Susp (Fluticasone propionate) .... Spray 1 puff into both nostrils once a day 5)  Simvastatin 40 Mg Tabs (Simvastatin) .... Take 1 tablet by mouth at bedtime 6)  Alprazolam 0.5 Mg Tbdp (Alprazolam) .... Take one tablet two times a day 7)  Vicodin 5-500 Mg Tabs (Hydrocodone-acetaminophen) .... Take one tablet three times a day as needed 8)  Topiramate 50 Mg Tabs (Topiramate) .... Take 2 tabs at bedtime 9)  Requip 1 Mg Tabs (Ropinirole hcl) .... Take 2 tablets at bedtime 10)  Microspacer Misc (Spacer/aero-holding chambers) .... For use with mdi 11)  Nicotrol 10 Mg Inha (Nicotine) .... Use 5 - 6 times  daily  Other Orders: Orthopedic Referral (Ortho) [facility]- Est Level  3 (16109) Pneumococcal Vaccine (60454) Admin 1st Vaccine (09811) Admin 1st Vaccine [facility]) [phone])  Patient Instructions: 1)  Please schedule a follow-up appointment in 1 month.  2)  You commit to quit smoking June 10th.  3)  Stop smoking tips: Choose a quit date. Cut down before the quit date. Decide what you will do as a substitute when you feel the urge to smoke(gum, toothpick, exercise).  Prescriptions: VICODIN 5-500 MG TABS (HYDROCODONE-ACETAMINOPHEN) Take one tablet three times a day as needed  #90 x 3   Entered and Authorized by:   Zachery Dauer MD   Signed by:   Zachery Dauer MD on 01/30/2010   Method used:   Print then Give to Patient   RxID:   [id number] VICODIN 5-500 MG TABS (HYDROCODONE-ACETAMINOPHEN) Take one tablet three times a day as needed  #90 x 3   Entered and Authorized by:   Zachery Dauer MD   Signed by:   Zachery Dauer MD on 01/30/2010   Method used:   Historical   RxID:   [id number] ALPRAZOLAM 0.5 MG  TBDP (ALPRAZOLAM) Take one tablet two times a day  #60 x 2   Entered and Authorized by:   Zachery Dauer MD   Signed by:   Zachery Dauer MD on 01/30/2010   Method used:   Printed then faxed to ...       [facility]  [address] [id number] (retail)       [address]       [place], [place]  [address]       Ph: [phone]       Fax: [phone]   RxID:   [id number] PHENOBARBITAL 64.8 MG  TABS (PHENOBARBITAL) Take 3 tabs at bedtime  #90 x 11   Entered and Authorized by:   Zachery Dauer MD   Signed by:   Zachery Dauer MD on 01/30/2010   Method used:   Printed then faxed to ...       [facility]  [address] [id number] (retail)       [address]       [place], [place]  [address]       Ph: [phone]       Fax: [phone]   RxID:   [id number]    Pneumovax Vaccine    Vaccine Type: Pneumovax    Site: left deltoid    Mfr: Merck    Dose: 0.5 ml    Route: IM     Given by: Arlyss Repress CMA,    Exp. Date: 07/11/2011    Lot #: [id number]    VIS given: 04/13/96 version given Jan 30, 2010.  Prevention & Chronic Care Immunizations   Influenza vaccine: Fluvax Non-MCR  (06/08/2008)   Influenza vaccine due: 06/08/2009    Tetanus booster: 08/02/2008: given   Tetanus booster due: 08/02/2018    Pneumococcal vaccine: Pneumovax  (01/30/2010)  Other Screening   Smoking status: current  (01/30/2010)   Smoking cessation counseling: yes  (04/14/2007)   Target quit date: 02/23/2010  (01/30/2010)  Lipids   Total Cholesterol: 159  (03/22/2008)   LDL: 98  (03/22/2008)   LDL Direct: 95  (07/04/2009)   HDL: 35  (03/22/2008)   Triglycerides: 131  (03/22/2008)   Lipid panel due: 07/21/2009    SGOT (AST): 9  (06/13/2009)   SGPT (ALT): 12  (06/13/2009)   Alkaline phosphatase: 95  (06/13/2009)   Total bilirubin: 0.2  (06/13/2009)  Self-Management Support :   Personal Goals (by the next clinic visit) :      Personal LDL goal: 130  (06/20/2009)    Lipid self-management support: Not documented

## 2010-10-18 NOTE — Letter (Signed)
Summary: Results Follow-up Letter  [facility]  [address]   [place], [place] [address]   Phone: [phone]  Fax: [phone]    06/22/2010  [id number] [place], [place]  [address]  Dear Mr. Smoots,   The following are the results of your recent test(s): Patient: Richard Glass Your LDL under 100 is at goal. Your low HDL (good cholesterol) increases your risk of heart disease. Exercise can increase it. You can lower your triglycerides by taking Fish Oil capsules 1000 units twice daily.  Tests: (1) Lipid Profile (72536)   Order Note: FASTING   Cholesterol               167 mg/dL                   6-440     ATP III Classification:           < 200        mg/dL        Desirable          200 - 239     mg/dL        Borderline High          >= 240        mg/dL        High         Triglyceride         [H]  196 mg/dL                   <347   HDL Cholesterol      [L]  32 mg/dL                    >42   Total Chol/HDL Ratio      5.2 Ratio  VLDL Cholesterol (Calc)                             39 mg/dL                    5-95  LDL Cholesterol (Calc)                             96 mg/dL                    6-38           Total Cholesterol/HDL Ratio:CHD Risk                            Coronary Heart Disease Risk Table                                            Men       Women              1/2 Average Risk              3.4        3.3                  Average Risk              5.0        4.4  2 X Average Risk              9.6        7.1              3 X Average Risk             23.4       11.0     Use the calculated Patient Ratio above and the CHD Risk table      to determine the patient's CHD Risk.     ATP III Classification (LDL):           < 100        mg/dL         Optimal          100 - 129     mg/dL         Near or Above Optimal          130 - 159     mg/dL         Borderline High          160 - 189     mg/dL         High           >  190        mg/dL         Very High   Document Creation Date: 06/19/2010 11:31 PM Sincerely,  Zachery Dauer MD [facility]          Appended Document: Results Follow-up Letter mailed.

## 2010-10-18 NOTE — Assessment & Plan Note (Signed)
Summary: F/U VISIT/[facility]   Vital Signs:  Patient Profile:   40 Years Old Male Height:     68.5 inches Weight:      196.0 pounds Temp:     98.2 degrees F Pulse rate:   84 / minute BP sitting:   117 / 77  (left arm)  Pt. in pain?   yes    Location:   back and hips    Intensity:   8    Type:       aching  Vitals Entered ByJacki Cones RN (March 15, 2008 3:41 PM)                  PCP:  Zachery Dauer MD  Chief Complaint:  f/u from [facility] 02/18/08 with Dr. Beverely Low.  History of Present Illness: No longer using the donut pillow but still sore sitting. Working most days. Has decrease his Vicodin back to the usual 3 daily  Spells every other day in the heat where head feels full and thinking comes to a stop x 1 minute No falls. Last was a trip fall.   Dr Hermelinda Medicus didn't change meds on recent visit. Dr Wynn Banker had started Ropinirole and Alprozolam on 12/17/07. Doesn't take the latter every day. Not sure if the Ropinirole helps  Was constipated with hard stool that he attributes to Lyrica       Risk Factors:  Tobacco use:  quit    Year quit:  08/2008    Physical Exam  General:     Well-developed,well-nourished,in no acute distress; alert,appropriate and cooperative throughout examination Lungs:     Normal respiratory effort, chest expands symmetrically. Lungs are clear to auscultation, no crackles or wheezes. Heart:     Normal rate and regular rhythm. S1 and S2 normal without gallop, murmur, click, rub or other extra sounds. Psych:     normally interactive, good eye contact, not anxious appearing, and not depressed appearing.  Mentally sharp    Impression & Recommendations:  Problem # 1:  ACCIDENTAL FALL (ICD-E888.9) Trip fall, but encouraged him to limit use of Alprazolam which with phenobarb could cause falls. Contusions improved Orders: [facility]- Est Level  3 (16109)   Problem # 2:  CONVULSIONS, SEIZURES, NOS (ICD-780.39) Stable, May be having brief partial sz  His  updated medication list for this problem includes:    Phenobarbital 64.8 Mg Tabs (Phenobarbital) .Marland Kitchen... Take 3 tabs at bedtime    Lyrica 75 Mg Caps (Pregabalin) .Marland Kitchen... Take 1 capsule by mouth three times a day  Orders: [facility]- Est Level  3 (60454)   Problem # 3:  BACK PAIN W/RADIATION, UNSPECIFIED (ICD-724.4) Doubt that Requip or Xanax are helping. Advised OTC Miralax for constipation His updated medication list for this problem includes:    Kadian 50 Mg Cp24 (Morphine sulfate) .Marland Kitchen... Take 1 capsule by mouth every 24 hours    Vicodin 5-500 Mg Tabs (Hydrocodone-acetaminophen) .Marland Kitchen... Take 1 tablet by mouth three times a day  Orders: [facility]- Est Level  3 (09811)   Problem # 4:  OBESITY (ICD-278.00) Assessment: Unchanged  Problem # 5:  HYPERLIPIDEMIA (ICD-272.4) Needs follow-up testing His updated medication list for this problem includes:    Simvastatin 40 Mg Tabs (Simvastatin) .Marland Kitchen... Take 1 tablet by mouth at bedtime   Complete Medication List: 1)  Lidoderm 5 % Ptch (Lidocaine) .... Apply once daily for 12 hours. max 3 patches at a time 2)  Combivent 103-18 Mcg/act Aero (Albuterol-ipratropium) .... Take 2 puffs every 4-6 hours  as needed 3)  Prilosec Otc 20 Mg Tbec (Omeprazole magnesium) .... Take one tablet daily 4)  Phenobarbital 64.8 Mg Tabs (Phenobarbital) .... Take 3 tabs at bedtime 5)  Flonase 50 Mcg/act Susp (Fluticasone propionate) .... Spray 1 puff into both nostrils once a day 6)  Kadian 50 Mg Cp24 (Morphine sulfate) .... Take 1 capsule by mouth every 24 hours 7)  Lyrica 75 Mg Caps (Pregabalin) .... Take 1 capsule by mouth three times a day 8)  Simvastatin 40 Mg Tabs (Simvastatin) .... Take 1 tablet by mouth at bedtime 9)  Vicodin 5-500 Mg Tabs (Hydrocodone-acetaminophen) .... Take 1 tablet by mouth three times a day 10)  Cipro Hc 0.2-1 % Susp (Ciprofloxacin-hydrocortisone) .... Instill 3 drops in affected ear two times a day for 7 days 11)  Alprazolam 0.5 Mg Tbdp (Alprazolam) ....  Take one tablet two times a day 12)  Requip 1 Mg Tabs (Ropinirole hcl) .... Take 2 tabs at bedtime   Patient Instructions: 1)  Please schedule a follow-up appointment in 3 months. 2)  Please return for lab work in one (1) week. Come fasting since supper the night before.    Prescriptions: VICODIN 5-500 MG TABS (HYDROCODONE-ACETAMINOPHEN) Take 1 tablet by mouth three times a day  #90 x 3   Entered and Authorized by:   Zachery Dauer MD   Signed by:   Zachery Dauer MD on 03/15/2008   Method used:   Print then Give to Patient   RxID:   [id number] VICODIN 5-500 MG TABS (HYDROCODONE-ACETAMINOPHEN) Take 1 tablet by mouth three times a day  #30 x 90   Entered and Authorized by:   Zachery Dauer MD   Signed by:   Zachery Dauer MD on 03/15/2008   Method used:   Print then Give to Patient   RxID:   [id number] REQUIP 1 MG  TABS (ROPINIROLE HCL) Take 2 tabs at bedtime  #60 x 0   Entered and Authorized by:   Zachery Dauer MD   Signed by:   Zachery Dauer MD on 03/15/2008   Method used:   Print then Give to Patient   RxID:   [id number] ALPRAZOLAM 0.5 MG  TBDP (ALPRAZOLAM) Take one tablet two times a day  #60 x 0   Entered and Authorized by:   Zachery Dauer MD   Signed by:   Zachery Dauer MD on 03/15/2008   Method used:   Print then Give to Patient   RxID:   [email or web address]  ]
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
## 2010-10-18 NOTE — Miscellaneous (Signed)
Summary: ROI  ROI   Imported By: Clydell Hakim 06/20/2009 14:13:56  _____________________________________________________________________  External Attachment:    Type:   Image     Comment:   External Document

## 2010-10-18 NOTE — Assessment & Plan Note (Signed)
Summary: problem list review

## 2010-10-18 NOTE — Assessment & Plan Note (Signed)
Summary: routine visit/eo   Vital Signs:  Patient profile:   40 year old male Height:      68 inches Weight:      179.2 pounds BMI:     27.35 Pulse rate:   87 / minute BP sitting:   116 / 73  (left arm) Cuff size:   regular  Vitals Entered By: Arlyss Repress CMA, (September 04, 2010 3:43 PM) CC: routine check up. refill Xanax and Vicodin Is Patient Diabetic? No Pain Assessment Patient in pain? yes     Location: back Intensity: 4 Onset of pain  Chronic   Primary Care Provider:  Zachery Dauer MD  CC:  routine check up. refill Xanax and Vicodin.  History of Present Illness: Feeling generally well. 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 Low back pain is about the same. Is doing the crunch exercises.   Social - Is making friends with a woman at church. She has a son.   Resp - No asthma. Mild nasal congestion. Hasn't smoked in 6 months.   GERD - better  Sleep - better  Ortho - right shoulder improving with exercis. Some pain in left shoulder  Habits & Providers  Alcohol-Tobacco-Diet     Tobacco Status: quit > 6 months  Current Medications (verified): 1)  Combivent 103-18 Mcg/act  Aero (Albuterol-Ipratropium) .... Take 2 Puffs Every 4-6 Hours As Needed 2)  Omeprazole 20 Mg Cpdr (Omeprazole) .... Take One Tab Daily 3)  Phenobarbital 64.8 Mg  Tabs (Phenobarbital) .... Take 3 Tabs At Bedtime 4)  Simvastatin 40 Mg Tabs (Simvastatin) .... Take 1 Tablet By Mouth At Bedtime 5)  Alprazolam 0.5 Mg  Tbdp (Alprazolam) .... Take One Tablet Two Times A Day 6)  Vicodin 5-500 Mg Tabs (Hydrocodone-Acetaminophen) .... Take One Tablet Three Times A Day As Needed 7)  Topiramate 100 Mg Tabs (Topiramate) .... Take One Tab At Bedtime 8)  Ropinirole Hcl 2 Mg Tabs (Ropinirole Hcl) .... Take One Tablet At Bedtime 9)  Microspacer  Misc (Spacer/aero-Holding [place]) .... For Use With Mdi 10)  Nicotrol 10 Mg Inha (Nicotine) .... Use 5 - 6 Times Daily  Allergies (verified): No Known Drug  Allergies  Social History: Single disabled due to back pain and seizure disorder Church - [address] Smokes occasional cigar, quit cigarettes in 2008 and again 2011 Rare beer; Drinks large amounts of sweet iced tea limited driving  Physical Exam  General:  alert.  well-nourished.  Seems in better tone Nose:  Mildly congested. Dry mucosa Lungs:  Normal respiratory effort, chest expands symmetrically. Lungs are clear to auscultation, no crackles or wheezes. Heart:  Normal rate and regular rhythm. S1 and S2 normal without gallop, murmur, click, rub or other extra sounds. Msk:  right shoulder can only be actively abducted to 70 degrees. No apparent pain. Limited internal rotation reaching behind his back. Full range of motion left shoulder  Back fair flexion and extension. Normal gait.   Neurologic:  Less remulous in arms holding postures. Jerky in some movements, but improved.  Psych:  normally interactive and good eye contact.  Goofy jokes   Impression & Recommendations:  Problem # 1:  ROTATOR CUFF SYNDROME, RIGHT (ICD-726.10) Assessment Improved  Problem # 2:  BACK PAIN W/RADIATION, UNSPECIFIED (ICD-724.4) Assessment: Improved I encouraged him to replace Vicodin doses with Acetaminophen 2-500 mg tabs. Continue to be active and do crunches.  His updated medication list for this problem includes:    Vicodin 5-500 Mg Tabs (Hydrocodone-acetaminophen) .Marland Kitchen... Take one tablet three times  a day as needed  Problem # 3:  CONVULSIONS, SEIZURES, NOS (ICD-780.39) None recently His updated medication list for this problem includes:    Phenobarbital 64.8 Mg Tabs (Phenobarbital) .Marland Kitchen... Take 3 tabs at bedtime    Topiramate 100 Mg Tabs (Topiramate) .Marland Kitchen... Take one tab at bedtime  Orders: [facility]- Est  Level 4 (47829)  Problem # 4:  LEUKOCYTOSIS UNSPECIFIED (ICD-288.60) Improved Orders: CBC-[facility] (56213)  Problem # 5:  GASTROESOPHAGEAL REFLUX, NO ESOPHAGITIS (ICD-530.81) Improved His updated  medication list for this problem includes:    Omeprazole 20 Mg Cpdr (Omeprazole) .Marland Kitchen... Take one tab daily  Complete Medication List: 1)  Combivent 103-18 Mcg/act Aero (Albuterol-ipratropium) .... Take 2 puffs every 4-6 hours as needed 2)  Omeprazole 20 Mg Cpdr (Omeprazole) .... Take one tab daily 3)  Phenobarbital 64.8 Mg Tabs (Phenobarbital) .... Take 3 tabs at bedtime 4)  Simvastatin 40 Mg Tabs (Simvastatin) .... Take 1 tablet by mouth at bedtime 5)  Alprazolam 0.5 Mg Tbdp (Alprazolam) .... Take one tablet two times a day 6)  Vicodin 5-500 Mg Tabs (Hydrocodone-acetaminophen) .... Take one tablet three times a day as needed 7)  Topiramate 100 Mg Tabs (Topiramate) .... Take one tab at bedtime 8)  Ropinirole Hcl 2 Mg Tabs (Ropinirole hcl) .... Take one tablet at bedtime 9)  Microspacer Misc (Spacer/aero-holding chambers) .... For use with mdi 10)  Nicotrol 10 Mg Inha (Nicotine) .... Use 5 - 6 times daily 11)  Fish Oil Concentrate 1000 Mg Caps (Omega-3 fatty acids) .... Take one cap 4 times daily  Other Orders: Comp Met-[facility] ([id number])  Patient Instructions: 1)  Keep exercising, I'm seeing the good effects.  2)  Please schedule a follow-up appointment in 3 months .  Prescriptions: ALPRAZOLAM 0.5 MG  TBDP (ALPRAZOLAM) Take one tablet two times a day  #60 x 3   Entered and Authorized by:   Zachery Dauer MD   Signed by:   Zachery Dauer MD on 09/04/2010   Method used:   Print then Give to Patient   RxID:   [id number] VICODIN 5-500 MG TABS (HYDROCODONE-ACETAMINOPHEN) Take one tablet three times a day as needed  #90 x 3   Entered and Authorized by:   Zachery Dauer MD   Signed by:   Zachery Dauer MD on 09/04/2010   Method used:   Print then Give to Patient   RxID:   [id number]    Orders Added: 1)  [facility]- Est  Level 4 [99214] 2)  Comp Met-[facility] [63875-64332] 3)  CBC-[facility] [85027]      Prevention & Chronic Care Immunizations   Influenza vaccine: Fluvax Non-MCR  (06/08/2008)    Influenza vaccine due: 06/08/2009    Tetanus booster: 08/02/2008: given   Tetanus booster due: 08/02/2018    Pneumococcal vaccine: Pneumovax  (01/30/2010)  Other Screening   Smoking status: quit > 6 months  (09/04/2010)  Lipids   Total Cholesterol: 167  (06/19/2010)   LDL: 96  (06/19/2010)   LDL Direct: 95  (07/04/2009)   HDL: 32  (06/19/2010)   Triglycerides: 196  (06/19/2010)   Lipid panel due: 07/21/2009    SGOT (AST): 9  (06/13/2009)   SGPT (ALT): 12  (06/13/2009) CMP ordered    Alkaline phosphatase: 95  (06/13/2009)   Total bilirubin: 0.2  (06/13/2009)    Lipid flowsheet reviewed?: Yes   Progress toward LDL goal: At goal  Self-Management Support :   Personal Goals (by the next clinic visit) :      Personal  LDL goal: 130  (06/20/2009)    Lipid self-management support: Not documented

## 2010-10-18 NOTE — Assessment & Plan Note (Signed)
Summary: [place]-stomach pain   Vital Signs:  Patient profile:   40 year old male Height:      68 inches Weight:      174 pounds BMI:     26.55 Temp:     97.9 degrees F oral Pulse rate:   84 / minute BP sitting:   118 / 75  (left arm) CC: abd an Is Patient Diabetic? No   Primary Care Provider:  Zachery Dauer MD  CC:  abd an.  History of Present Illness: Last P.M. the diarrhea recurred, leg cramps. This AM, bowel movement x 3. Diarrhea. Abdominal pain more periumbilical. No urinary tract symptoms or fever.  Hasn't been taking the Kadian for 4-5 days, was on two times a day. Taking Vicodin two times a day.  Couple days of frontal headache, rhinorrhea, post-nasal drip. Took Tylenol. Not wheezing.   Habits & Providers  Alcohol-Tobacco-Diet     Tobacco Status: never  Social History: Smoking Status:  never  Physical Exam  General:  alert.  Well appearing.  Nose:  Mildly congested Lungs:  Normal respiratory effort, chest expands symmetrically. Lungs are clear to auscultation, no crackles or wheezes. Heart:  Normal rate and regular rhythm. S1 and S2 normal without gallop, murmur, click, rub or other extra sounds. Abdomen:  Mildly tender RLQ to suprapubic area. soft, no hepatomegaly, and no splenomegaly.  No rebound tenderness, Neg psoas sign. Cough doesn't induce abdomen pain.  Rectal:  No external abnormalities noted. Normal sphincter tone. No rectal masses or tenderness. Stool soft heme neg Prostate:  Prostate gland firm and smooth, no enlargement, nodularity, tenderness, mass, asymmetry or induration.   Impression & Recommendations:  Problem # 1:  ABDOMINAL PAIN, LOWER (ICD-789.09) I still wonder if some of these complaints relate to narcotic withdrawal. Says he still has 7-8 tablets from the 35  Kadian Dr Riley Kill gave him in  his last prescription on Sept 18th. No significant findings to suggest appendicitis and pain not currently blunted by narcotic since no Vicodin today.    His updated medication list for this problem includes:    Kadian 50 Mg Cp24 (Morphine sulfate) .Marland Kitchen... Take 1 capsule by mouth two times a day    Vicodin 5-500 Mg Tabs (Hydrocodone-acetaminophen) .Marland Kitchen... Take one tablet three times a day as needed    Ibuprofen 600 Mg Tabs (Ibuprofen) .Marland Kitchen... Take one tab every 8 hr with food as needed pain  Orders: [facility]- Est Level  3 (99213) CBC w/Diff-[facility] (16109) Urinalysis-[facility] (00000) Sed Rate (ESR)-[facility] (60454) Urine Culture-[facility] (09811-91478)  Problem # 2:  HYPOKALEMIA, MILD (ICD-276.8) Not current to be causing leg cramps.   Problem # 3:  RHINITIS, ALLERGIC (ICD-477.9) Most likely cause of his nasa/sinus symptoms  His updated medication list for this problem includes:    Flonase 50 Mcg/act Susp (Fluticasone propionate) ..... Spray 1 puff into both nostrils once a day  Orders: [facility]- Est Level  3 (99213)  Complete Medication List: 1)  Lidoderm 5 % Ptch (Lidocaine) .... Apply once daily for 12 hours. max 3 patches at a time 2)  Combivent 103-18 Mcg/act Aero (Albuterol-ipratropium) .... Take 2 puffs every 4-6 hours as needed 3)  Prilosec Otc 20 Mg Tbec (Omeprazole magnesium) .... Take one tablet daily 4)  Phenobarbital 64.8 Mg Tabs (Phenobarbital) .... Take 3 tabs at bedtime 5)  Flonase 50 Mcg/act Susp (Fluticasone propionate) .... Spray 1 puff into both nostrils once a day 6)  Kadian 50 Mg Cp24 (Morphine sulfate) .... Take 1 capsule by  mouth two times a day 7)  Simvastatin 40 Mg Tabs (Simvastatin) .... Take 1 tablet by mouth at bedtime 8)  Alprazolam 0.5 Mg Tbdp (Alprazolam) .... Take one tablet two times a day 9)  Vicodin 5-500 Mg Tabs (Hydrocodone-acetaminophen) .... Take one tablet three times a day as needed 10)  Topiramate 50 Mg Tabs (Topiramate) .... Take 2 tabs at bedtime 11)  Ibuprofen 600 Mg Tabs (Ibuprofen) .... Take one tab every 8 hr with food as needed pain  Patient Instructions: 1)  Take Immodium 2 mg over the counter medication, one  after each diarrhea bowel movement. 2)  Go to the emergency room if you have temperature over 101 or much more severe abdominal pain.  3)  I will see  you Tuesday as planned.  Prescriptions: IBUPROFEN 600 MG TABS (IBUPROFEN) Take one tab every 8 hr with food as needed pain  #30 x 0   Entered and Authorized by:   Zachery Dauer MD   Signed by:   Zachery Dauer MD on 06/16/2009   Method used:   Electronically to        [facility]  [address] [id number] (retail)       [address]       [place], [place]  [address]       Ph: [phone]       Fax: [phone]   RxID:   [id number]    Prevention & Chronic Care Immunizations   Influenza vaccine: Fluvax Non-MCR  (06/08/2008)   Influenza vaccine due: 06/08/2009    Tetanus booster: 08/02/2008: given   Tetanus booster due: 08/02/2018    Pneumococcal vaccine: Not documented  Other Screening   Smoking status: never  (06/16/2009)  Lipids   Total Cholesterol: 159  (03/22/2008)   LDL: 98  (03/22/2008)   LDL Direct: Not documented   HDL: 35  (03/22/2008)   Triglycerides: 131  (03/22/2008)    SGOT (AST): 9  (06/13/2009)   SGPT (ALT): 12  (06/13/2009)   Alkaline phosphatase: 95  (06/13/2009)   Total bilirubin: 0.2  (06/13/2009)  Self-Management Support :    Lipid self-management support: Not documented    Laboratory Results   Urine Tests  Date/Time Received: June 16, 2009 10:01 AM  Date/Time Reported: June 16, 2009 10:35 AM   Routine Urinalysis   Color: yellow Appearance: Clear Glucose: negative   (Normal Range: Negative) Bilirubin: negative   (Normal Range: Negative) Ketone: negative   (Normal Range: Negative) Spec. Gravity: 1.010   (Normal Range: 1.003-1.035) Blood: negative   (Normal Range: Negative) pH: 7.0   (Normal Range: 5.0-8.0) Protein: negative   (Normal Range: Negative) Urobilinogen: 0.2   (Normal Range: 0-1) Nitrite: negative   (Normal Range: Negative) Leukocyte Esterace: trace    (Normal Range: Negative)  Urine Microscopic WBC/HPF: 1-5 Bacteria/HPF: 2+ cocci Epithelial/HPF: 1-5 Casts/LPF: occ hyaline    Comments: 4 cc spun ...............test performed by......Marland KitchenBonnie A. [place], [place] (ASCP)     Appended Document: ESR results  Laboratory Results   Blood Tests   Date/Time Received: June 16, 2009 10:01 AM  Date/Time Reported: June 16, 2009 11:33 AM   SED rate: 2 mm/hr  Comments: ...........test performed by...........Marland KitchenTerese Door, CMA
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
## 2010-10-18 NOTE — Letter (Signed)
Summary: Results Follow-up Letter  [facility]  [address]   [place], [place] [address]   Phone: [phone]  Fax: [phone]    01/04/2009  [address] [place], [place]  [address]  Dear Mr. Delmonaco,   The following are the results of your recent test(s): Patient: DERAN BARRO Your phenobarbital level is in the treatment range so you should continue the same dose of that medication. Your blood count and other lab tests are normal except for your potassium which is low. Usually this comes from taking a diuretic medicine (water pill),  but you're not on that. Low potassium can cause muscle cramps. You shoul eat bananas and citrus fruits and come in early next week for a repeat potassium test to see if it has come back to normal.   Tests: (1) CBC NO Diff (Complete Blood Count) (10000)   Order Note: LAST DOSE OF PHENOBARB 4/19@7 :00PM 194.4MG    WBC                       9.6 K/uL                    4.0-10.5   RBC                       4.80 MIL/uL                 4.22-5.81   Hemoglobin                14.9 g/dL                   52.8-41.3   Hematocrit                44.6 %                      39.0-52.0   MCV                       92.9 fL                     78.0-100.0   MCHC                      33.4 g/dL                   24.4-01.0   RDW                       13.6 %                      11.5-15.5   Platelet Count            237 K/uL                    150-400  Tests: (2) Comprehensive Metabolic Panel (27253)   Sodium                    141 mEq/L                   135-145   Potassium            [L]  3.1 mEq/L                   3.5-5.3  Chloride                  107 mEq/L                   96-112   CO2                       22 mEq/L                    19-32   Glucose              [L]  67 mg/dL                    16-10   BUN                  [L]  5 mg/dL                     9-60   Creatinine                0.64 mg/dL                  0.40-1.50  Bilirubin, Total     [L]  0.2 mg/dL                   4.5-4.0   Alkaline Phosphatase      92 U/L                      39-117   AST/SGOT                  10 U/L                      0-37   ALT/SGPT                  8 U/L                       0-53   Total Protein             6.4 g/dL                    9.8-1.1   Albumin                   4.2 g/dL                    9.1-4.7   Calcium                   8.5 mg/dL                   8.2-95.6  Tests: (3) Phenobarbital (21308)   Phenobarbital             22.3 ug/mL                  15.0-40.0  Document Creation Date: 01/04/2009 6:00 AM  Sincerely,  Zachery Dauer MD [facility]           Appended Document: Results Follow-up Letter Mailed.

## 2010-10-18 NOTE — Assessment & Plan Note (Signed)
Summary: FU R shoulder pain/[facility]   Vital Signs:  Patient profile:   40 year old male Height:      68 inches Weight:      176 pounds BMI:     26.86 Pulse rate:   80 / minute BP sitting:   117 / 77  (left arm)  Vitals Entered By: Theresia Lo RN (October 24, 2009 1:42 PM) CC: follow up on right shoulder  pain Is Patient Diabetic? No Pain Assessment Patient in pain? yes     Location: right shoulder Intensity: 8 Type: sharp   Primary Care Provider:  Zachery Dauer MD  CC:  follow up on right shoulder  pain.  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 No specific injury, but his right shoulder became much more painful 3 days ago so he went to the [facility] ER. right shoulder xray showed mild proliferative changed in distal clavicle. diagnosis right rotator cuff injury, prescription sling to use intermittently and Percocet 5 mg-325 # 20. His shoulder remains very painful.   Habits & Providers  Alcohol-Tobacco-Diet     Tobacco Status: quit  Current Medications (verified): 1)  Combivent 103-18 Mcg/act  Aero (Albuterol-Ipratropium) .... Take 2 Puffs Every 4-6 Hours As Needed 2)  Prilosec Otc 20 Mg  Tbec (Omeprazole Magnesium) .... Take One Tablet Daily 3)  Phenobarbital 64.8 Mg  Tabs (Phenobarbital) .... Take 3 Tabs At Bedtime 4)  Flonase 50 Mcg/act Susp (Fluticasone Propionate) .... Spray 1 Puff Into Both Nostrils Once A Day 5)  Simvastatin 40 Mg Tabs (Simvastatin) .... Take 1 Tablet By Mouth At Bedtime 6)  Alprazolam 0.5 Mg  Tbdp (Alprazolam) .... Take One Tablet Two Times A Day 7)  Vicodin 5-500 Mg Tabs (Hydrocodone-Acetaminophen) .... Take One Tablet Three Times A Day As Needed 8)  Topiramate 50 Mg Tabs (Topiramate) .... Take 2 Tabs At Bedtime 9)  Requip 1 Mg Tabs (Ropinirole Hcl) .... Take 2 Tablets At Bedtime 10)  Diclofenac Sodium 75 Mg Tbec (Diclofenac Sodium) .... Take One Tab Two Times A Day  Allergies (verified): No Known Drug Allergies  Physical  Exam  Lungs:  Normal respiratory effort, chest expands symmetrically. Lungs are clear to auscultation, no crackles or wheezes. Heart:  Normal rate and regular rhythm. S1 and S2 normal without gallop, murmur, click, rub or other extra sounds. Msk:  Negative drop test. Pos impingement sign  right shoulderarc more painful  Full ext rotation, on int rotation can barely get arm behind back. Tender sub acromial area. Not over [facility] joint, neg crossover test.  Neurologic:  alert & oriented X3.  strength normal in all extremities and DTRs symmetrical and normal.   Psych:  normally interactive and good eye contact.     Impression & Recommendations:  Problem # 1:  ROTATOR CUFF SYNDROME, RIGHT (ICD-726.10) No sign of tear. Ortho consult if poor response to second injection.  Orders: [facility]- Est Level  3 (04540) Injection, large joint- [facility] (20610)  Problem # 2:  CHRONIC PAIN SYNDROME (ICD-338.4) Says he was discharged by Dr Riley Kill for foul language, not for the negative test. Consider serum hydrocodone level next visit Orders: [facility]- Est Level  3 (98119)  Complete Medication List: 1)  Combivent 103-18 Mcg/act Aero (Albuterol-ipratropium) .... Take 2 puffs every 4-6 hours as needed 2)  Prilosec Otc 20 Mg Tbec (Omeprazole magnesium) .... Take one tablet daily 3)  Phenobarbital 64.8 Mg Tabs (Phenobarbital) .... Take 3 tabs at bedtime 4)  Flonase 50  Mcg/act Susp (Fluticasone propionate) .... Spray 1 puff into both nostrils once a day 5)  Simvastatin 40 Mg Tabs (Simvastatin) .... Take 1 tablet by mouth at bedtime 6)  Alprazolam 0.5 Mg Tbdp (Alprazolam) .... Take one tablet two times a day 7)  Vicodin 5-500 Mg Tabs (Hydrocodone-acetaminophen) .... Take one tablet three times a day as needed 8)  Topiramate 50 Mg Tabs (Topiramate) .... Take 2 tabs at bedtime 9)  Requip 1 Mg Tabs (Ropinirole hcl) .... Take 2 tablets at bedtime 10)  Diclofenac Sodium 75 Mg Tbec (Diclofenac sodium) .... Take one tab two times a  day  Patient Instructions: 1)  Please schedule a follow-up appointment in 1 week  2)  Take either the Oxycodone or Hydrocodone with APAP for pain 3)  Take the Ibuprofen with food Prescriptions: VICODIN 5-500 MG TABS (HYDROCODONE-ACETAMINOPHEN) Take one tablet three times a day as needed  #90 x 0   Entered and Authorized by:   Zachery Dauer MD   Signed by:   Zachery Dauer MD on 10/24/2009   Method used:   Print then Give to Patient   RxID:   [id number] VICODIN 5-500 MG TABS (HYDROCODONE-ACETAMINOPHEN) Take one tablet three times a day as needed  #90 x 0   Entered and Authorized by:   Zachery Dauer MD   Signed by:   Zachery Dauer MD on 10/24/2009   Method used:   Print then Mail to Patient   RxID:   [id number]

## 2010-10-18 NOTE — Assessment & Plan Note (Signed)
Summary: F/U backpain, WBCEO   Vital Signs:  Patient profile:   40 year old male Height:      68 inches Weight:      176.3 pounds BMI:     26.90 Temp:     98.1 degrees F Pulse rate:   83 / minute BP sitting:   115 / 68  (left arm)  Vitals Entered By: Zachery Dauer MD (July 04, 2009 2:50 PM) CC: f/u, Abdominal Pain Is Patient Diabetic? No Pain Assessment Patient in pain? yes     Location: back Intensity: 7   Primary Care Provider:  Zachery Dauer MD  CC:  f/u and Abdominal Pain.  History of Present Illness: Still has nasal congestion and bilateral cheek pain, eyes burning, sneezing, slightly cough.   Pain in back and legs. 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   Little dyspepsia is watches diet.   No fever. No urinary tract infection symptoms No dental pains or swollen glands.   Has had diarrhea again this past weekend off and on all day. Formed stoo. this AM    Asthma History    Initial Asthma Severity Rating:    Age range: 12+ years    Symptoms: daily    Nighttime Awakenings: 0-2/month    Interferes w/ normal activity: no limitations    SABA use (not for EIB): several times per day    Asthma Severity Assessment: Severe Persistent  Dyspepsia History:      There is a prior history of GERD.      Habits & Providers  Alcohol-Tobacco-Diet     Tobacco Status: quit  Current Medications (verified): 1)  Combivent 103-18 Mcg/act  Aero (Albuterol-Ipratropium) .... Take 2 Puffs Every 4-6 Hours As Needed 2)  Prilosec Otc 20 Mg  Tbec (Omeprazole Magnesium) .... Take One Tablet Daily 3)  Phenobarbital 64.8 Mg  Tabs (Phenobarbital) .... Take 3 Tabs At Bedtime 4)  Flonase 50 Mcg/act Susp (Fluticasone Propionate) .... Spray 1 Puff Into Both Nostrils Once A Day 5)  Simvastatin 40 Mg Tabs (Simvastatin) .... Take 1 Tablet By Mouth At Bedtime 6)  Alprazolam 0.5 Mg  Tbdp (Alprazolam) .... Take One Tablet Two Times A Day 7)  Vicodin 5-500 Mg Tabs  (Hydrocodone-Acetaminophen) .... Take One Tablet Three Times A Day As Needed 8)  Topiramate 50 Mg Tabs (Topiramate) .... Take 2 Tabs At Bedtime 9)  Requip 1 Mg Tabs (Ropinirole Hcl) .... Take 2 Tablets At Bedtime  Allergies (verified): No Known Drug Allergies  Physical Exam  General:  alert.  Well appearing.  Ears:  External ear exam shows no significant lesions or deformities.  Otoscopic examination reveals clear canals, tympanic membranes are intact bilaterally without bulging, retraction, inflammation or discharge. Hearing is grossly normal bilaterally. Nose:  Mildly congested Mouth:  Lymphoid hyperplasia Neck:  No deformities, masses, or tenderness noted. Lungs:  Normal respiratory effort, chest expands symmetrically. Lungs are clear to auscultation, no crackles or wheezes. Heart:  Normal rate and regular rhythm. S1 and S2 normal without gallop, murmur, click, rub or other extra sounds. Abdomen:  Mildly tender RLQ to suprapubic area. soft, no hepatomegaly, and no splenomegaly.  No rebound tenderness, Msk:  straight leg raising normal. Moves chair to table without apparent pain Neurologic:  alert & oriented X3.   Cervical Nodes:  No lymphadenopathy noted Axillary Nodes:  No palpable lymphadenopathy Inguinal Nodes:  No significant adenopathy Psych:  mildly dysphoric affect     Impression & Recommendations:  Problem #  1:  BACK PAIN W/RADIATION, UNSPECIFIED (ICD-724.4) Controlled substance contract signed by patient. UDS ordered. urinalysis cancelled due to insuf quantity His updated medication list for this problem includes:    Vicodin 5-500 Mg Tabs (Hydrocodone-acetaminophen) .Marland Kitchen... Take one tablet three times a day as needed  Orders: Urinalysis-[facility] (00000) CBC-[facility] (81191) [facility]- Est  Level 4 (47829)  Problem # 2:  LEUKOCYTOSIS UNSPECIFIED (ICD-288.60) No apparent source other than sinuses Orders: Urinalysis-[facility] (00000) CBC-[facility] (56213) Sed Rate (ESR)-[facility] (85651) [facility]- Est   Level 4 (08657)  Problem # 3:  CONVULSIONS, SEIZURES, NOS (ICD-780.39) None recently His updated medication list for this problem includes:    Phenobarbital 64.8 Mg Tabs (Phenobarbital) .Marland Kitchen... Take 3 tabs at bedtime    Topiramate 50 Mg Tabs (Topiramate) .Marland Kitchen... Take 2 tabs at bedtime  Problem # 4:  DIARRHEA (ICD-787.91) Recurred, no cause found previously Orders: Miscellaneous Lab Charge-[facility] (84696) [facility]- Est  Level 4 (29528)  Problem # 5:  RHINITIS, ALLERGIC (ICD-477.9) Assessment: Unchanged  His updated medication list for this problem includes:    Flonase 50 Mcg/act Susp (Fluticasone propionate) ..... Spray 1 puff into both nostrils once a day  Problem # 6:  HYPERLIPIDEMIA (ICD-272.4)  His updated medication list for this problem includes:    Simvastatin 40 Mg Tabs (Simvastatin) .Marland Kitchen... Take 1 tablet by mouth at bedtime  Orders: Direct LDL-[facility] (41324-40102) [facility]- Est  Level 4 (72536)  Complete Medication List: 1)  Combivent 103-18 Mcg/act Aero (Albuterol-ipratropium) .... Take 2 puffs every 4-6 hours as needed 2)  Prilosec Otc 20 Mg Tbec (Omeprazole magnesium) .... Take one tablet daily 3)  Phenobarbital 64.8 Mg Tabs (Phenobarbital) .... Take 3 tabs at bedtime 4)  Flonase 50 Mcg/act Susp (Fluticasone propionate) .... Spray 1 puff into both nostrils once a day 5)  Simvastatin 40 Mg Tabs (Simvastatin) .... Take 1 tablet by mouth at bedtime 6)  Alprazolam 0.5 Mg Tbdp (Alprazolam) .... Take one tablet two times a day 7)  Vicodin 5-500 Mg Tabs (Hydrocodone-acetaminophen) .... Take one tablet three times a day as needed 8)  Topiramate 50 Mg Tabs (Topiramate) .... Take 2 tabs at bedtime 9)  Requip 1 Mg Tabs (Ropinirole hcl) .... Take 2 tablets at bedtime  Patient Instructions: 1)  I will call with lab results if they are abnormal  2)  Please schedule a follow-up appointment in 1 month.  Prescriptions: ALPRAZOLAM 0.5 MG  TBDP (ALPRAZOLAM) Take one tablet two times a day  #60 x 1    Entered and Authorized by:   Zachery Dauer MD   Signed by:   Zachery Dauer MD on 07/04/2009   Method used:   Printed then [facility]ed [facility] ...       [facility]  [address] [id number]       [address]       [place], [place]  [address]       Ph: [phone]       Fax: [phone]   RxID:   [id number] REQUIP 1 MG TABS (ROPINIROLE HCL) Take 2 tablets at bedtime  #60 x 6   Entered and Authorized by:   Zachery Dauer MD   Signed by:   Zachery Dauer MD on 07/04/2009   Method used:   [facility]        [facility]  [address] [facility]       [address] [facility]       [place]  [place], [place]  [address]       Ph: [phone]       Fax: [phone]   RxID:   [id number] TOPIRAMATE 50 MG TABS (TOPIRAMATE) Take 2 tabs at bedtime  #60 x 11   Entered and Authorized by:   Zachery Dauer MD   Signed by:   Zachery Dauer MD on 07/04/2009   Method used:   [facility]        [facility]  [address] [id number]       [address]       [place], [place]  [address]       Ph: [phone]       Fax: [phone]   RxID:   [id number]   Laboratory Results   Urine Tests  Date/Time Received: July 04, 2009 3:39 PM  Date/Time Reported: July 04, 2009 4:36 PM   Routine Urinalysis   Color: yellow Appearance: Clear Glucose: negative   (Normal Range: Negative) Bilirubin: negative   (Normal Range: Negative) Ketone: negative   (Normal Range: Negative) Spec. Gravity: <1.005   (Normal Range: 1.003-1.035) Blood: trace-intact   (Normal Range: Negative) pH: 6.0   (Normal Range: 5.0-8.0) Protein: negative   (Normal Range: Negative) Urobilinogen: 0.2   (Normal Range: 0-1) Nitrite: negative   (Normal Range: Negative) Leukocyte Esterace: negative   (Normal Range: Negative)    Comments: QNS for micro ...............test performed by......Marland KitchenBonnie A. [place], [place] (ASCP)   Blood Tests   Date/Time Received: July 04, 2009 3:39 PM  Date/Time  Reported: July 04, 2009 4:50 PM   SED rate: 1  mm/hr  Comments: ...............test performed by......Marland KitchenBonnie A. [place], [place] (ASCP)        Prevention & Chronic Care Immunizations   Influenza vaccine: Fluvax Non-MCR  (06/08/2008)   Influenza vaccine due: 06/08/2009    Tetanus booster: 08/02/2008: given   Tetanus booster due: 08/02/2018    Pneumococcal vaccine: Not documented  Other Screening   Smoking status: quit  (07/04/2009)  Lipids   Total Cholesterol: 159  (03/22/2008)   LDL: 98  (03/22/2008)   LDL Direct: Not documented   HDL: 35  (03/22/2008)   Triglycerides: 131  (03/22/2008)   Lipid panel due: 07/21/2009    SGOT (AST): 9  (06/13/2009)   SGPT (ALT): 12  (06/13/2009)   Alkaline phosphatase: 95  (06/13/2009)   Total bilirubin: 0.2  (06/13/2009)    Lipid flowsheet reviewed?: Yes   Progress toward LDL goal: At goal  Self-Management Support :   Personal Goals (by the next clinic visit) :      Personal LDL goal: 130  (06/20/2009)    Lipid self-management support: Not documented

## 2010-10-18 NOTE — Assessment & Plan Note (Signed)
Summary: F/U/KH   Vital Signs:  Patient profile:   40 year old male Height:      68 inches Weight:      175 pounds BMI:     26.70 Temp:     98.5 degrees F oral Pulse rate:   92 / minute BP sitting:   115 / 81  (right arm) Cuff size:   regular  Vitals Entered By: Tessie Fass CMA (June 26, 2010 1:56 PM) CC: f/up labs. f/up shoulder pain. better. Is Patient Diabetic? No Pain Assessment Patient in pain? yes     Location: ankle Intensity: 6   Primary Care Provider:  Zachery Dauer MD  CC:  f/up labs. f/up shoulder pain. better.Marland Kitchen  History of Present Illness: left ankle pain treated as sprain with splint in ortho follow-up Recheck in 2-3 weeks.   Done wiith shoulder therapy;Still some pain and limitation  Pain med for shoulder and low back  No recent seizures  Getting a scratchy throat  Habits & Providers  Alcohol-Tobacco-Diet     Tobacco Status: quit > 6 months  Allergies: No Known Drug Allergies  Social History: Smoking Status:  quit > 6 months  Physical Exam  Lungs:  Normal respiratory effort, chest expands symmetrically. Lungs are clear to auscultation, no crackles or wheezes. Heart:  Normal rate and regular rhythm. S1 and S2 normal without gallop, murmur, click, rub or other extra sounds. Msk:  right shoulder can only be actively abducted to 70 degrees. No apparent pain. Limited internal rotation reaching behind his back  left lower leg in a CAM walker Neurologic:  Less remulous in arms holding postures. Jerky in some movements Psych:  normally interactive and good eye contact.  Goofy jokes   Impression & Recommendations:  Problem # 1:  ROTATOR CUFF SYNDROME, RIGHT (ICD-726.10) No improvement since last visit  Problem # 2:  BACK PAIN W/RADIATION, UNSPECIFIED (ICD-724.4)  His updated medication list for this problem includes:    Vicodin 5-500 Mg Tabs (Hydrocodone-acetaminophen) .Marland Kitchen... Take one tablet three times a day as needed  Orders: [facility]- Est  Level  3 ([address]109)  Problem # 3:  CONVULSIONS, SEIZURES, NOS (ICD-780.39) Controlled.  His updated medication list for this problem includes:    Phenobarbital 64.8 Mg Tabs (Phenobarbital) .Marland Kitchen... Take 3 tabs at bedtime    Topiramate 100 Mg Tabs (Topiramate) .Marland Kitchen... Take one tab at bedtime  Orders: [facility]- Est Level  3 (60454)  Problem # 4:  INSOMNIA (ICD-780.52) Asked him to minimize use of Alprazolam His updated medication list for this problem includes:    Phenobarbital 64.8 Mg Tabs (Phenobarbital) .Marland Kitchen... Take 3 tabs at bedtime  Complete Medication List: 1)  Combivent 103-18 Mcg/act Aero (Albuterol-ipratropium) .... Take 2 puffs every 4-6 hours as needed 2)  Omeprazole 20 Mg Cpdr (Omeprazole) .... Take one tab daily 3)  Phenobarbital 64.8 Mg Tabs (Phenobarbital) .... Take 3 tabs at bedtime 4)  Fluticasone Propionate 50 Mcg/act Susp (Fluticasone propionate) .... One spray each nostril daily 5)  Simvastatin 40 Mg Tabs (Simvastatin) .... Take 1 tablet by mouth at bedtime 6)  Alprazolam 0.5 Mg Tbdp (Alprazolam) .... Take one tablet two times a day 7)  Vicodin 5-500 Mg Tabs (Hydrocodone-acetaminophen) .... Take one tablet three times a day as needed 8)  Topiramate 100 Mg Tabs (Topiramate) .... Take one tab at bedtime 9)  Ropinirole Hcl 2 Mg Tabs (Ropinirole hcl) .... Take one tablet at bedtime 10)  Microspacer Misc (Spacer/aero-holding chambers) .... For use with mdi 11)  Nicotrol 10 Mg Inha (Nicotine) .... Use 5 - 6 times daily  Patient Instructions: 1)  Continue meds the same 2)  Please schedule a follow-up appointment in 2 months.  Prescriptions: ALPRAZOLAM 0.5 MG  TBDP (ALPRAZOLAM) Take one tablet two times a day  #60 x 1   Entered and Authorized by:   Zachery Dauer MD   Signed by:   Zachery Dauer MD on 06/26/2010   Method used:   Print then Give to Patient   RxID:   [id number] PHENOBARBITAL 64.8 MG  TABS (PHENOBARBITAL) Take 3 tabs at bedtime  #90 x 11   Entered and Authorized by:    Zachery Dauer MD   Signed by:   Zachery Dauer MD on 06/26/2010   Method used:   Print then Give to Patient   RxID:   [id number]    Prevention & Chronic Care Immunizations   Influenza vaccine: Fluvax Non-MCR  (06/08/2008)   Influenza vaccine due: 06/08/2009    Tetanus booster: 08/02/2008: given   Tetanus booster due: 08/02/2018    Pneumococcal vaccine: Pneumovax  (01/30/2010)  Other Screening   Smoking status: quit > 6 months  (06/26/2010)  Lipids   Total Cholesterol: 167  (06/19/2010)   LDL: 96  (06/19/2010)   LDL Direct: 95  (07/04/2009)   HDL: 32  (06/19/2010)   Triglycerides: 196  (06/19/2010)   Lipid panel due: 07/21/2009    SGOT (AST): 9  (06/13/2009)   SGPT (ALT): 12  (06/13/2009)   Alkaline phosphatase: 95  (06/13/2009)   Total bilirubin: 0.2  (06/13/2009)    Lipid flowsheet reviewed?: Yes   Progress toward LDL goal: At goal  Self-Management Support :   Personal Goals (by the next clinic visit) :      Personal LDL goal: 130  (06/20/2009)    Patient will work on the following items until the next clinic visit to reach self-care goals:     Medications and monitoring: take my medicines every day  (06/26/2010)     Eating: eat more vegetables  (06/26/2010)    Lipid self-management support: Not documented

## 2010-10-18 NOTE — Assessment & Plan Note (Signed)
Summary: fu visit/bmc   Vital Signs:  Patient profile:   40 year old male Height:      68 inches Weight:      175 pounds BMI:     26.70 Pulse rate:   93 / minute BP sitting:   160 / 80  (right arm)  Vitals Entered By: Renato Battles slade,cma  Serial Vital Signs/Assessments:  Time      Position  BP       Pulse  Resp  Temp     By                     118/64                         Zachery Dauer MD  CC: f/up right shoulder. not much better after injection Is Patient Diabetic? No Pain Assessment Patient in pain? yes     Location: right shoulder Intensity: 6   Primary Care Provider:  Zachery Dauer MD  CC:  f/up right shoulder. not much better after injection.  History of Present Illness: Pain left shoulder 6/10 when he moves it. Strength is decreased. Hasn't gotten his Vicodin because he has been taking the Percocet and asks if he should change to that. Will get his Vicodin filled today since I recommended he continue that.   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 He started wearing a mask due to blowing black dust from his nose. Asthma symptoms mild but does use the Combivent about once daily. Doesn't have a spacer.   His tremor, more on the right, has been present since his head injury.     Habits & Providers  Alcohol-Tobacco-Diet     Tobacco Status: quit  Current Medications (verified): 1)  Combivent 103-18 Mcg/act  Aero (Albuterol-Ipratropium) .... Take 2 Puffs Every 4-6 Hours As Needed 2)  Omeprazole 20 Mg Cpdr (Omeprazole) .... Take One Tab Daily 3)  Phenobarbital 64.8 Mg  Tabs (Phenobarbital) .... Take 3 Tabs At Bedtime 4)  Flonase 50 Mcg/act Susp (Fluticasone Propionate) .... Spray 1 Puff Into Both Nostrils Once A Day 5)  Simvastatin 40 Mg Tabs (Simvastatin) .... Take 1 Tablet By Mouth At Bedtime 6)  Alprazolam 0.5 Mg  Tbdp (Alprazolam) .... Take One Tablet Two Times A Day 7)  Vicodin 5-500 Mg Tabs  (Hydrocodone-Acetaminophen) .... Take One Tablet Three Times A Day As Needed 8)  Topiramate 50 Mg Tabs (Topiramate) .... Take 2 Tabs At Bedtime 9)  Requip 1 Mg Tabs (Ropinirole Hcl) .... Take 2 Tablets At Bedtime  Allergies (verified): No Known Drug Allergies  Physical Exam  General:  alert.   Lungs:  Normal respiratory effort, chest expands symmetrically. Lungs are clear to auscultation, no crackles or wheezes. Peak flow 560 Heart:  Normal rate and regular rhythm. S1 and S2 normal without gallop, murmur, click, rub or other extra sounds. Msk:  Negative drop test. Pos impingement sign, but less  right shoulder arc less painful  Full ext rotation, on int rotation can barely get arm behind back. Tender sub acromial area and ant shoulder,  neg crossover test.  Neurologic:  alert & oriented X3.  Coarse tremor in arms holding a  posture and on intention movements, FTN. Greater on the left side.     Impression & Recommendations:  Problem # 1:  ROTATOR CUFF SYNDROME, RIGHT (ICD-726.10) Assessment Improved  Improving. Has been able to work.  Given instructions on range of motion and strengthening exercises from [facility] site.   Orders: [facility]- Est Level  3 ([address]109)  Problem # 2:  ASTHMA, UNSPECIFIED (ICD-493.90)  Reinforced need to wear a mask when exposed to dusts and molds. Asthma plan completed. Will send in spacer  His updated medication list for this problem includes:    Combivent 103-18 Mcg/act Aero (Albuterol-ipratropium) .Marland Kitchen... Take 2 puffs every 4-6 hours as needed  Orders: [facility]- Est Level  3 (60454)  Complete Medication List: 1)  Combivent 103-18 Mcg/act Aero (Albuterol-ipratropium) .... Take 2 puffs every 4-6 hours as needed 2)  Omeprazole 20 Mg Cpdr (Omeprazole) .... Take one tab daily 3)  Phenobarbital 64.8 Mg Tabs (Phenobarbital) .... Take 3 tabs at bedtime 4)  Flonase 50 Mcg/act Susp (Fluticasone propionate) .... Spray 1 puff into both nostrils once a day 5)  Simvastatin 40 Mg  Tabs (Simvastatin) .... Take 1 tablet by mouth at bedtime 6)  Alprazolam 0.5 Mg Tbdp (Alprazolam) .... Take one tablet two times a day 7)  Vicodin 5-500 Mg Tabs (Hydrocodone-acetaminophen) .... Take one tablet three times a day as needed 8)  Topiramate 50 Mg Tabs (Topiramate) .... Take 2 tabs at bedtime 9)  Requip 1 Mg Tabs (Ropinirole hcl) .... Take 2 tablets at bedtime 10)  Microspacer Misc (Spacer/aero-holding chambers) .... For use with mdi  Patient Instructions: 1)  Please schedule a follow-up appointment in 1 month.  2)  Peak flow today is 560. 3)  Do exercises and your shoulder should gradually get better.  Prescriptions: MICROSPACER  MISC (SPACER/AERO-HOLDING CHAMBERS) For use with MDI  #1 x 0   Entered and Authorized by:   Zachery Dauer MD   Signed by:   Zachery Dauer MD on 10/31/2009   Method used:   [facility] to        [facility] [address] [id number]       [address]       [place], [place]  [address]       Ph: [phone]       Fax: [phone]   RxID:   [id number] OMEPRAZOLE 20 MG CPDR (OMEPRAZOLE) Take one tab daily  #30 x 11   Entered and Authorized by:   Zachery Dauer MD   Signed by:   Zachery Dauer MD on 10/31/2009   Method used:   [facility]        [facility]  [address] [id number]       [address]       [place], [place]  [address]       Ph: [phone]       Fax: [phone]   RxID:   [id number]     Prevention & Chronic Care Immunizations   Influenza vaccine: Fluvax Non-MCR  (06/08/2008)   Influenza vaccine due: 06/08/2009    Tetanus booster: 08/02/2008: given   Tetanus booster due: 08/02/2018    Pneumococcal vaccine: Not documented  Other Screening   Smoking status: quit  (10/31/2009)  Lipids   Total Cholesterol: 159  (03/22/2008)   LDL: 98  (03/22/2008)   LDL Direct: 95  (07/04/2009)   HDL: 35  (03/22/2008)   Triglycerides: 131  (03/22/2008)   Lipid panel due:  07/21/2009    SGOT (AST): 9  (06/13/2009)   SGPT (ALT): 12  (06/13/2009)   Alkaline phosphatase: 95  (06/13/2009)   Total bilirubin:  0.2  (06/13/2009)    Lipid flowsheet reviewed?: Yes   Progress toward LDL goal: At goal  Self-Management Support :   Personal Goals (by the next clinic visit) :      Personal LDL goal: 130  (06/20/2009)    Lipid self-management support: Not documented

## 2010-10-18 NOTE — Assessment & Plan Note (Signed)
Summary: flu shot wp  Nurse Visit   Vital Signs:  Patient Profile:   40 Years Old Male Temp:     98 degrees F                 Prior Medications: LIDODERM 5 %  PTCH (LIDOCAINE) Apply once daily for 12 hours. Max 3 patches at a time COMBIVENT 103-18 MCG/ACT  AERO (ALBUTEROL-IPRATROPIUM) Take 2 puffs every 4-6 hours as needed PRILOSEC OTC 20 MG  TBEC (OMEPRAZOLE MAGNESIUM) Take one tablet daily PHENOBARBITAL 64.8 MG  TABS (PHENOBARBITAL) Take 3 tabs at bedtime FLONASE 50 MCG/ACT SUSP (FLUTICASONE PROPIONATE) Spray 1 puff into both nostrils once a day KADIAN 50 MG CP24 (MORPHINE SULFATE) Take 1 capsule by mouth every 24 hours LYRICA 75 MG CAPS (PREGABALIN) Take 1 capsule by mouth three times a day SIMVASTATIN 40 MG TABS (SIMVASTATIN) Take 1 tablet by mouth at bedtime VICODIN 5-500 MG TABS (HYDROCODONE-ACETAMINOPHEN) Take 1 tablet by mouth three times a day    Influenza Vaccine    Vaccine Type: Fluvax 3+    Site: right deltoid    Mfr: Sanofi Pasteur    Dose: 0.5 ml    Route: IM    Given by: [name] RN    Exp. Date: 03/15/2008    Lot #: [id number]    VIS given: 03/15/05 version given July 01, 2007.  Flu Vaccine Consent Questions    Do you have a history of severe allergic reactions to this vaccine? no    Any prior history of allergic reactions to egg and/or gelatin? no    Do you have a sensitivity to the preservative Thimersol? no    Do you have a past history of Guillan-Barre Syndrome? no    Do you currently have an acute febrile illness? no    Have you ever had a severe reaction to latex? no    Vaccine information given and explained to patient? yes   Orders Added: 1)  Flu Vaccine 62yrs + [90658] 2)  Admin 1st Vaccine [90471] 3)  Est Level 1- [facility] [56213]    ]

## 2010-10-18 NOTE — Progress Notes (Signed)
Summary: Triage  Phone Note Call from Patient Call back at Home Phone [phone]   Reason for Call: Talk to Nurse Summary of Call: pt would like to know if we can call anything in for him for poison oak? Initial call taken by: Knox Royalty,  May 06, 2008 11:31 AM  Follow-up for Phone Call        x 1 week. arms, hands & genital area. unable to find a ride to come in. has tried otc meds & lotions w/o relief. he will try to find someone to bring him in & will call if able Follow-up by: [name] RN,  May 06, 2008 11:32 AM  Additional Follow-up for Phone Call Additional follow up Details #1::        Instructed to take all of dose pack, that stopping early may cause a flair.  Topicals for comfort. Additional Follow-up by: Luretha Murphy NP,  May 06, 2008 12:27 PM    New/Updated Medications: STERAPRED 12 DAY 5 MG TABS (PREDNISONE) as directed HYDROCORTISONE 2.5 % CREA (HYDROCORTISONE) apply to rash two times a day as needed, 60 GM   Prescriptions: HYDROCORTISONE 2.5 % CREA (HYDROCORTISONE) apply to rash two times a day as needed, 60 GM  #1 x 0   Entered and Authorized by:   Luretha Murphy NP   Signed by:   Luretha Murphy NP on 05/06/2008   Method used:   [facility]        [facility]  [address] [phone][facility] (retail[facility]       [address]       [place], [place]  [address]       Ph: [phone] or [phone]       Fax: [phone]   RxID:   [id number] STERAPRED 12 DAY 5 MG TABS (PREDNISONE) as directed  #1 x 0   Entered and Authorized by:   Luretha Murphy NP   Signed by:   Luretha Murphy NP on 05/06/2008   Method used:   [facility]        [facility]  [address] [phone][facility] (retail)       [address]       [place], [place]  [address]       Ph: [phone] or [phone]       Fax: [phone]   RxID:   [id number]

## 2010-11-04 ENCOUNTER — Other Ambulatory Visit: Payer: Self-pay | Admitting: Family Medicine

## 2010-11-04 NOTE — Telephone Encounter (Signed)
Refill request

## 2010-11-21 ENCOUNTER — Telehealth: Payer: Self-pay | Admitting: *Deleted

## 2010-11-21 ENCOUNTER — Ambulatory Visit (INDEPENDENT_AMBULATORY_CARE_PROVIDER_SITE_OTHER): Payer: Medicare Other

## 2010-11-21 VITALS — BP 129/79 | HR 98 | Temp 98.5°F | Resp 20 | Ht 69.0 in | Wt 183.0 lb

## 2010-11-21 DIAGNOSIS — J019 Acute sinusitis, unspecified: Secondary | ICD-10-CM | POA: Insufficient documentation

## 2010-11-21 DIAGNOSIS — J392 Other diseases of pharynx: Secondary | ICD-10-CM

## 2010-11-21 MED ORDER — AMOXICILLIN 500 MG PO CAPS: 500.0000 mg | ORAL_CAPSULE | Freq: Three times a day (TID) | ORAL | Status: AC

## 2010-11-21 NOTE — Assessment & Plan Note (Addendum)
Finding of raised erythematous mass along superior aspect oropharynx/into nasopharynx.  Cannot visualize superior border of lesion, does not clear with swallowing.  For referral to ENT for further evaluation.  He denies hoarseness but does report epistaxis in mornings and evenings, with recent episode of illness. Discussed reason for referral with patient, who was given opportunity to ask questions.

## 2010-11-21 NOTE — Patient Instructions (Signed)
It was a pleasure to see you today.  I believe you have sinusitis, and I am prescribing an antibiotic, amoxicillin. Please take it three times a day for 10 days.   You may try nasal saline spray, (over the counter) before using the Flonase spray.  Two sprays per nostril after rinsing with nasal saline.   Please make an appointment to follow up in the next 2 to 3 weeks with your primary doctor, Dr Sheffield Slider.   I am referring you to see an Ear Nose Throat doctor to evaluate the lump on the back of your throat.   We want to take extra precautions in people with a history of smoking.  Congratulations on remaining a nonsmoker for 9 months!!

## 2010-11-21 NOTE — Assessment & Plan Note (Signed)
Three week history of bilat maxillary/frontal sinus pressure and nasal discharge with blood; fevers; exam which is consistent with acute sinusitis.  Will opt to treat with amoxil, nasal saline, and continue nasal steroid spray.  Patient given explicit instructions on use of nasal sprays,  Is to come for follow up in the coming 2 to 3 weeks with primary physician.

## 2010-11-21 NOTE — Progress Notes (Signed)
  Subjective:    Patient ID: Richard Glass, male    DOB: 1971-01-21, 40 y.o.   MRN: [id number]  HPI Pt here for same day appt.  Complains of URI cough sneezing for the past 3 weeks, now with nasal dryness and needs to use nose spray continuously.  Headaches "sinus headaches" now. Started with fevers about 1 week ago.  Tylenol not helping.  Takes hydrocodone as well, which helped to dull the headaches.  Sinus pills (zyrtec) did not help at all.  Pattern of continued worsening since onset; the worst over the past 2 weeks.  Now sore throat, dry nose, cough, and started with fevers (101F measured at home 3 nights ago). Nyquil did not help.  Had diarrhea yesterday. Headaches throb across the frontal area; last all day, exacerbated when he bends over to floor. Radiates into maxillary bilaterally.  Anorexia, no vomiting. Some photophobia with HAs. Has been laying on couch. He received pneumovax and flu shot this year.  REmains a nonsmoker for the past 9 months.  Using inhaler twice a day (for asthma); thus, does not report shortness of breath.  Reports his last Td around 3 to 4 years.  No sick contacts.   On disability.  No work contacts.  Regarding diarrhea, seven liquid bowel movements yesterday, no blood.    Social Hx; Quit cigarette smoking 9 months ago, had smoked 1ppd since age 66.  Rare social alcohol (one time a month), never a habitual drinker.  No illicit drug use.   Review of Systems  Gives history of prior sinus surgery many years ago, unsure of who was his ENT nor when surgery done.      Objective:   Physical Exam  Constitutional: He appears well-developed and well-nourished.       Alert, no apparent distress but appears mildly ill.  Nasal speech, fluid speech.  HENT:  Head: Normocephalic and atraumatic.       Exophytic erythematous mass along upper oropharynx extending into nasopharynx, unable to visualize upper margin of lesion.  Does not clear with swallowing.    Bilat tenderness  over maxillary and frontal sinuses.   Nasal turbinates obstructed by dry mucus.  No blood or purulence in floor of vault.   Eyes: EOM are normal. No scleral icterus.  Abdominal: Soft. Bowel sounds are normal. He exhibits no distension and no mass. There is no tenderness. There is no rebound and no guarding.  Musculoskeletal: He exhibits no edema and no tenderness.  Skin: Skin is warm. He is not diaphoretic.  Psychiatric: He has a normal mood and affect. His behavior is normal. Judgment and thought content normal.          Assessment & Plan:

## 2010-11-30 NOTE — Telephone Encounter (Signed)
Pt is aware of appt fri.Richard Glass

## 2010-12-04 ENCOUNTER — Ambulatory Visit (INDEPENDENT_AMBULATORY_CARE_PROVIDER_SITE_OTHER): Payer: Medicare Other | Admitting: Family Medicine

## 2010-12-04 ENCOUNTER — Encounter: Payer: Self-pay | Admitting: Family Medicine

## 2010-12-04 VITALS — BP 120/80 | HR 80 | Temp 97.8°F | Ht 68.75 in | Wt 183.5 lb

## 2010-12-04 DIAGNOSIS — J309 Allergic rhinitis, unspecified: Secondary | ICD-10-CM

## 2010-12-04 DIAGNOSIS — J019 Acute sinusitis, unspecified: Secondary | ICD-10-CM

## 2010-12-04 DIAGNOSIS — IMO0002 Reserved for concepts with insufficient information to code with codable children: Secondary | ICD-10-CM

## 2010-12-04 DIAGNOSIS — J392 Other diseases of pharynx: Secondary | ICD-10-CM

## 2010-12-04 DIAGNOSIS — R569 Unspecified convulsions: Secondary | ICD-10-CM

## 2010-12-04 DIAGNOSIS — G4700 Insomnia, unspecified: Secondary | ICD-10-CM

## 2010-12-04 MED ORDER — PHENOBARBITAL 64.8 MG PO TABS: ORAL_TABLET | ORAL | Status: DC

## 2010-12-04 MED ORDER — AMOXICILLIN-POT CLAVULANATE 875-125 MG PO TABS: 1.0000 | ORAL_TABLET | Freq: Two times a day (BID) | ORAL | Status: AC

## 2010-12-04 NOTE — Assessment & Plan Note (Signed)
Continues unchanged. Remains active on the hydrocodone.

## 2010-12-04 NOTE — Patient Instructions (Addendum)
Continue your saline nasal washes and Zyrtec while taking the Augmentin.   Recheck in 3 weeks. Call if you develop fevers or other complications, like excessive diarrhea.

## 2010-12-04 NOTE — Assessment & Plan Note (Signed)
Dr Pollyann Kennedy said it was scar tissue

## 2010-12-04 NOTE — Progress Notes (Signed)
  Subjective:    Patient ID: Richard Glass, male    DOB: 27-Sep-1970, 40 y.o.   MRN: [id number]  HPINo improvement in frontal headaches after the course of Amoxicillin.Occur daily and often present in the morning. Calls them migraines, no visual sx but did vomit once.  I saw a consult note from Dr Pollyann Kennedy who told the patient that the throat tissue is just scar tissue. It wasn't mentioned in the note. No further medication was started. [name] has been taking otc Zyrtec but continues sneezing and eye itching. Is wearing a mask while doing yard work. Only occ wheezing, not using Albuterol. The headache is mainly R sided and he's blowing thick material out of both sides of his nose.Feels chilly and sweats.   Has cut his Ropinerol down to 1 every other day.   Insomnia -  Goes to bed 12-1 AM Awakens 5-6AM  Continues not smoking but is using chewing tobacco.   R shoulder still painful. Continues Vicodin bid for back pain.    Review of Systems     Objective:   Physical Exam  Constitutional: He appears well-developed and well-nourished.  HENT:  Right Ear: External ear normal.  Left Ear: External ear normal.  Nose: Nose normal.  Mouth/Throat: Oropharynx is clear and moist. No oropharyngeal exudate.       Tender over frontal and not over maxillary sinuses  Eyes: Conjunctivae are normal. Pupils are equal, round, and reactive to light.       Fundi normal, flat discs  Neck: Normal range of motion. No thyromegaly present.  Cardiovascular: Normal rate and regular rhythm.   Pulmonary/Chest: Effort normal and breath sounds normal.          Assessment & Plan:

## 2010-12-04 NOTE — Assessment & Plan Note (Signed)
No recent seizures

## 2010-12-04 NOTE — Assessment & Plan Note (Signed)
Recently more active, may be contributing to sinusitis

## 2010-12-04 NOTE — Assessment & Plan Note (Signed)
Continued frontal sinus sx. After ENT exam, still has symptoms. Will treat with Augmentin. If he fails to improve will check WBC and refer back to ENT for consideration of sinus CT.

## 2010-12-04 NOTE — Assessment & Plan Note (Signed)
Unchanged

## 2011-01-01 ENCOUNTER — Encounter: Payer: Self-pay | Admitting: Family Medicine

## 2011-01-01 ENCOUNTER — Ambulatory Visit (INDEPENDENT_AMBULATORY_CARE_PROVIDER_SITE_OTHER): Payer: Medicare Other | Admitting: Family Medicine

## 2011-01-01 DIAGNOSIS — S0990XA Unspecified injury of head, initial encounter: Secondary | ICD-10-CM

## 2011-01-01 DIAGNOSIS — D72829 Elevated white blood cell count, unspecified: Secondary | ICD-10-CM

## 2011-01-01 DIAGNOSIS — IMO0002 Reserved for concepts with insufficient information to code with codable children: Secondary | ICD-10-CM

## 2011-01-01 DIAGNOSIS — J019 Acute sinusitis, unspecified: Secondary | ICD-10-CM

## 2011-01-01 LAB — CBC WITH DIFFERENTIAL/PLATELET
Basophils Absolute: 0 10*3/uL (ref 0.0–0.1)
Basophils Relative: 0 % (ref 0–1)
Eosinophils Absolute: 0.3 10*3/uL (ref 0.0–0.7)
Eosinophils Relative: 3 % (ref 0–5)
HCT: 45.7 % (ref 39.0–52.0)
Hemoglobin: 15.6 g/dL (ref 13.0–17.0)
Lymphocytes Relative: 38 % (ref 12–46)
Lymphs Abs: 4.1 10*3/uL — ABNORMAL HIGH (ref 0.7–4.0)
MCH: 31.9 pg (ref 26.0–34.0)
MCHC: 34.1 g/dL (ref 30.0–36.0)
MCV: 93.5 fL (ref 78.0–100.0)
Monocytes Absolute: 0.7 10*3/uL (ref 0.1–1.0)
Monocytes Relative: 6 % (ref 3–12)
Neutro Abs: 5.6 10*3/uL (ref 1.7–7.7)
Neutrophils Relative %: 52 % (ref 43–77)
Platelets: 294 10*3/uL (ref 150–400)
RBC: 4.89 MIL/uL (ref 4.22–5.81)
RDW: 14.1 % (ref 11.5–15.5)
WBC: 10.8 10*3/uL — ABNORMAL HIGH (ref 4.0–10.5)

## 2011-01-01 MED ORDER — ALPRAZOLAM 0.5 MG PO TABS: 0.5000 mg | ORAL_TABLET | Freq: Two times a day (BID) | ORAL | Status: DC | PRN

## 2011-01-01 MED ORDER — HYDROCODONE-ACETAMINOPHEN 5-500 MG PO TABS: 1.0000 | ORAL_TABLET | Freq: Three times a day (TID) | ORAL | Status: DC | PRN

## 2011-01-01 NOTE — Assessment & Plan Note (Signed)
During his youth, will remove from the problem list

## 2011-01-01 NOTE — Progress Notes (Signed)
  Subjective:    Patient ID: Richard Glass, male    DOB: 12/18/1970, 40 y.o.   MRN: [id number]  HPI had sinus symptoms did not improve on the antibiotic. He continues bifrontal pain but does not change with change of position he has temperatures 99 - 00 in the evenings the past few days. He is blowing thick material out of his nose. No cough, but his usual spring allergy symptoms for which he uses the Flonase.  His back hurts off and on takes the Vicodin about 3 times daily the last 2 weeks.  The ropinirole continues to be taken every other day for restless leg symptoms.  His right toe is better but his left shoulder has been more painful recently. Just been working around the house.  Intermittent insomnia as before for which he takes Alprazolam   Review of Systems  Constitutional: Negative for fever.  HENT: Positive for sneezing, postnasal drip and sinus pressure.   Respiratory: Negative for cough and shortness of breath.   Neurological: Positive for tremors. Negative for seizures.       Objective:   Physical Exam  Constitutional: He is oriented to person, place, and time. He appears well-developed and well-nourished.       Minimally ill appearing  HENT:  Right Ear: External ear normal.  Left Ear: External ear normal.  Nose: Nose normal.  Mouth/Throat: No oropharyngeal exudate.       Lymphoid hyperplasia in nasopharynx  Mild frontal tenderness  Eyes:       Conjunctivae injected  Neck: No thyromegaly present.  Cardiovascular: Normal rate.   Pulmonary/Chest: Effort normal and breath sounds normal. He has no wheezes.  Musculoskeletal:       Full range of motion L shoulder with mild discomfort. Neg impingement or cuff tear signs  Lymphadenopathy:    He has no cervical adenopathy.  Neurological: He is alert and oriented to person, place, and time.       Coarse tremor of arms and mild rigidity of movements, but no cogwheeling  Skin: Skin is warm.          Assessment &  Plan:

## 2011-01-01 NOTE — Assessment & Plan Note (Signed)
WBC now 10K

## 2011-01-01 NOTE — Assessment & Plan Note (Signed)
With normal WBC I believe his continuing sx are just the healing phase. Continue allergy tx

## 2011-01-01 NOTE — Patient Instructions (Signed)
Return in 2 weeks, May cancel appointment if the sinus symptoms are gone  I'll call with the blood count results.

## 2011-01-01 NOTE — Assessment & Plan Note (Signed)
Chronic pain syndrome. Activity encouraged.

## 2011-01-02 ENCOUNTER — Telehealth: Payer: Self-pay | Admitting: Family Medicine

## 2011-01-02 MED ORDER — CELECOXIB 200 MG PO CAPS: 200.0000 mg | ORAL_CAPSULE | Freq: Two times a day (BID) | ORAL | Status: DC

## 2011-01-02 NOTE — Telephone Encounter (Signed)
Pt says he was told at his visit yesterday MD would send in abx, pt checking status, pharmacy has not received anything.

## 2011-01-02 NOTE — Assessment & Plan Note (Signed)
He called today requesting an antibiotic. Main complaint is frontal headache with photophobia. Feels chilly, but no temperatures higher than 100. No history of migraine. Will start Celebrex since Ibuprofen bothers his stomach and make ENT referral

## 2011-01-02 NOTE — Telephone Encounter (Signed)
Forward to Dr Hale 

## 2011-01-02 NOTE — Progress Notes (Signed)
Addended by: Zachery Dauer on: 01/02/2011 04:39 PM   Modules accepted: Orders

## 2011-01-03 ENCOUNTER — Telehealth: Payer: Self-pay | Admitting: *Deleted

## 2011-01-03 NOTE — Telephone Encounter (Signed)
Called and notified patient of appointment with [facility] on Jan 29, 2011 at 9:40.Richard Glass, [name]

## 2011-01-10 ENCOUNTER — Other Ambulatory Visit: Payer: Self-pay | Admitting: Family Medicine

## 2011-01-10 DIAGNOSIS — R569 Unspecified convulsions: Secondary | ICD-10-CM

## 2011-01-10 MED ORDER — PHENOBARBITAL 64.8 MG PO TABS: ORAL_TABLET | ORAL | Status: DC

## 2011-01-15 ENCOUNTER — Ambulatory Visit: Payer: Medicare Other | Admitting: Family Medicine

## 2011-01-18 NOTE — Telephone Encounter (Signed)
According to med list, abx was prescribed on 4/18. Closing encounter.

## 2011-01-29 NOTE — Assessment & Plan Note (Signed)
Eds back regarding his low back and multiple pain complaints.  We  discovered that he was receiving other narcotics from [facility] over the last year.  It sounds as if it was not his mistake  according to the [facility], although the patient persisted  in filling these medications.  He was given a warning in November,  although he does not recall receiving this.  A UDS at the time of his  last visit notes metabolite of his hydrocodone was not present in the  urine, which was suspicious for diversion.  The patient rates his pain  today at 7-8/10.  His Oswestry score is 42%.  His right shoulder seems  to be the biggest problem today and described as sharp, burning,  stabbing, and tingling.  Pain interferes with general activity,  relations with others, enjoyment of life on a moderate-to-severe level.  Sleep is fair.   REVIEW OF SYSTEMS:  Notable for the above.  No other specific changes  are noted.  Full review is in the written health and history section of  the chart.   SOCIAL HISTORY:  The patient is single, living with mother, doing odds  and ends work.   PHYSICAL EXAMINATION:  VITAL SIGNS:  Blood pressure is 116/69, pulse 85,  respiratory rate 18, and he is sating 97% on room air.  GENERAL:  The patient is pleasant, alert, and oriented x3.  Weight  stable.  Right shoulder is notably painful with rotator cuff impingement  maneuvers and palpation along the deltoid area and chromium.  The  patient has fair strength with abduction, but only actively abduct to  about 75 degrees before pain is unbearable.  Strength otherwise is 5/5  with normal sensory exam in the upper extremities.  He still has some  decreased perhaps in the lower distal limbs.  Reflexes are 1+ and 2+  throughout.   ASSESSMENT:  1. Multifactorial low back pain.  2. Peripheral neuropathy.  3. Plantar fasciitis.  4. Osteoarthritis of the knees  5. Restless leg syndrome.  6. Right  rotator cuff syndrome/bursitis.   PLAN:  1. Refill Kadian 50 b.i.d.  2. Vicodin 5/500, #60 and 75 respectively with a postdate on them of      October 07, 2008.  We will check UDS in the interim for      consistency.  He was warned any further discrepancies of any type      with mean discharge from the clinic.  3. After informed consent, I injected the right shoulder via lateral      approach of 40 mg Kenalog and 3 mL of 1% lidocaine.  The patient      tolerated well.  4. We will see him back in about a month and nursing and 3 months with      me.      Ranelle Oyster, M.D.  Electronically Signed     ZTS/MedQ  D:  10/04/2008 12:58:18  T:  10/05/2008 01:24:59  Job #:  [id number]   cc:   Deniece Portela A. Sheffield Slider, M.D.

## 2011-01-29 NOTE — Procedures (Signed)
NAMEYUVAAN, Richard Glass               ACCOUNT NO.:  [id number]   MEDICAL RECORD NO.:  [id number]          PATIENT TYPE:  REC   LOCATION:  [facility]                          FACILITY:  [facility]   PHYSICIAN:  Erick Colace, M.D.DATE OF BIRTH:  1970-09-23   DATE OF PROCEDURE:  01/14/2008  DATE OF DISCHARGE:  10/26/2007                               OPERATIVE REPORT   [name] returns today and states that his back pain and lower  extremity pain are not significantly improved by the medial branch  blocks performed December 17, 2007.  He has had pain in his back as well as  left lower extremity going down to the foot.  The pain is about 6/10,  averaging 8 described as tingling, dull, stabbing, sharp, burning.  The  pain is only partially responsive to narcotic analgesic medications and  other conservative care.   He has also had poor results with sacroiliac injection.  His last MRI  from 2005 was reviewed.  He had L5-S1 disk protrusion.    Left paramedian L4 translaminar lumbar epidural steroid injection under  fluoroscopic guidance.   Informed consent was obtained after describing risks and benefits of the  procedure to the patient.  These include bleeding, bruising, infection.  He elects to proceed and has given written consent.  The patient placed  prone on the fluoroscopy table.  Betadine prep, sterile drape 25-gauge 1-  1/2 inch needle was used to anesthetize the skin and subcutaneous tissue  with lidocaine.  Then an 18 gauge needle was Hustead needle was inserted  under fluoroscopic guidance targeting the inferior lamina of left  paramedian L5 lamina.  Bone contact made, confirmed with lateral  imaging.  Needle redirected inferiorly under lateral imaging plus loss-  of-resistance technique.  Epidural space was entered, confirmed with  Omnipaque 180 x 1 mL under live fluoro followed by injection of 1.5 mL  of 4 mg/mL Depo-Medrol and 2 mL of 1% lidocaine.  The patient tolerated  the  procedure well.  Post injection instructions given.      Erick Colace, M.D.  Electronically Signed     AEK/MEDQ  D:  01/14/2008 17:17:31  T:  01/14/2008 17:59:59  Job:  [id number]

## 2011-01-29 NOTE — Assessment & Plan Note (Signed)
Richard Glass is here in followup of his back pain.  He had a lumbar epidural  injection by Dr. Wynn Banker last month as he found the medial branch  blocks not specifically helpful.  He states that the lumbar epidural  helped less than the medial branch blocks.  He still has some pain down  the left leg.  He rates the pain today a 5 out of 10.  He has lost some  weight.  He remains on his previous pain medications including Kadian,  Vicodin and Lyrica.  The pain is described as sharp, burning, sometimes  dull and stabbing.  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   REVIEW OF SYSTEMS:  Notable for the above.  Full review is in the  written health and history section of the chart.   PHYSICAL EXAMINATION:  Blood pressure 111/64.  Pulse is 82.  Respiratory  rate 18.  He is satting 99% on room air.  Patient is pleasant, alert and  oriented x3.  Weight continues to improve.  He has pain still at the  lower lumbar areas, particularly the L4-L5 and L5-S1 levels.  PSIS areas  were slightly tender.  Strength was 5 out of 5 with some distal sensory  loss noted.  Straight leg testing was equivocal with more hamstring pain  noted than anything else particularly on the left side.  HEART:  Regular.  CHEST:  Clear.  ABDOMEN:  Soft, nontender.   ASSESSMENT:  1. Chronic low back pain which is likely multifactorial related to      facet arthritis as well as generalized spondylosis of the spine.  2. History of peripheral neuropathy which may be causing some of the      left legged symptoms, although I think part of those symptoms      related to hamstring tightness on that side.  3. Plantar fascitis.  4. Left leg length discrepancy.  5. Osteoarthritis of the knees.  6. Status post restless leg syndrome.   PLAN:  1. I am recommending no further injections at this point.  We did talk      about regular stretching exercise, management of weight and diet      management.  2.  Refilled Kadian 50 mg every day as well as p.r.n. Vicodin at 5/500      one every 8 hours p.r.n. #75.  3. Lyrica as well 150 mg three times a day.  4. I will see him back in about 3 months with nurse clinic followup in      1 month's time.      Ranelle Oyster, M.D.  Electronically Signed     ZTS/MedQ  D:  02/15/2008 14:55:32  T:  02/15/2008 16:21:39  Job #:  [id number]

## 2011-01-29 NOTE — Assessment & Plan Note (Signed)
Richard Glass is back regarding his low back pain.  Pain seems to increase a  bit over the summer.  However, he has been very active with his work.  He rates his pain as 7-8/10, describes it as dull, burning, sharp, and  tingling.  It starts in his low to mid back and he has pain also down  the left leg.  He remains on Lyrica 150 t.i.d., Kadian 15 mg once a day,  and Vicodin 5/500 q.8 h. p.r.n.  He wants to see if he can try something  different than Lyrica.  Pain interferes with general activity, relations  with others, and enjoyment of life on a moderate level.   REVIEW OF SYSTEMS:  Notable for the above.  He does report ongoing  weight gain, which is difficult to manage unless he is really active.  Full review is in the written at the history section in the chart.   SOCIAL HISTORY:  The patient is single, living with his mother.   PHYSICAL EXAMINATION:  VITAL SIGNS:  Blood pressure is 131/76, pulse 92,  respiratory rate 18, and he is saturating 97% on room air.  GENERAL:  The patient is pleasant, alert, and oriented x3.  Affect is  generally bright and appropriate.  Weight is stable from the last visit.  HEART:  Regular.  CHEST:  Clear.  ABDOMEN:  Soft and nontender.  MUSCULOSKELETAL:  He continues to have pain in the lower lumbar spine.  Straight leg testing was equivocal on the left today.  He continues to  have some hamstring tightness.  He is limited somewhat with lumbar  flexion more so than extension.  EXTREMITIES:  His strength is 5/5 in all 4 extremities.  NEUROLOGIC:  Sensory exam is nonfocal.   ASSESSMENT:  1. Multifactorial low back pain.  2. Peripheral neuropathy.  3. Plantar fascitis.  4. Left-leg length discrepancy.  5. Osteoarthritis of the knees.  6. Restless leg syndrome.   PLAN:  1. We will increase Kadian back up to 15 mg twice a day.  Keep Vicodin      the same 5/500 one q.8 h. p.r.n.  2. We will change Lyrica to Topamax to see if this helps with weight  issues.  I believe he may have tried Topamax before, but he is      willing to give it another shot.  3. We encouraged exercise and appropriate diet.  4. I will see him back in 3 months with nurse clinic followup in 1      month's time.      Ranelle Oyster, M.D.  Electronically Signed     ZTS/MedQ  D:  05/13/2008 10:33:04  T:  05/13/2008 23:43:58  Job #:  [id number]

## 2011-01-29 NOTE — Procedures (Signed)
[name], Richard Glass               ACCOUNT NO.:  [id number]   MEDICAL RECORD NO.:  [id number]          PATIENT TYPE:  REC   LOCATION:  [facility]                          FACILITY:  [facility]   PHYSICIAN:  Erick Colace, M.D.DATE OF BIRTH:  1971/05/23   DATE OF PROCEDURE:  12/17/2007  DATE OF DISCHARGE:                               OPERATIVE REPORT   PROCEDURE:  Bilateral L5 dorsal ramus injection, bilateral L4 medial  branch block, bilateral L3 medial branch block under fluoroscopic  guidance of the sacral area and fluoroscopic guidance of the lumbar  area.   Informed consent was obtained after describing risks and benefits of the  procedure to the patient.  The benefits include increased mobility and  reduced pain.  His average pain is in the 8 out of 10 range and  interferes with activity moderately, especially worse with walking,  bending and sitting and standing.   The risks include bleeding, bruising, infection, temporary or permanent  paralysis.  He elected to proceed and has given written consent.  The  patient placed prone on fluoroscopy table.  Betadine prep, sterile  drape.  A 25-gauge inch and half needle was used to anesthetize the skin  and subcutaneous tissue with 1% lidocaine x2 mL.  Then a 22-gauge, 3 and  a 1/2 inch spinal needle was inserted first targeting the left S1 SAP  sacral ala junction and bone contact made confirmed with lateral  imaging.  Omnipaque 180 x 0.5 mL demonstrated no intravascular uptake.  Then 0.75 mL of solution containing 1 mL of 4 mg/mL of dexamethasone and  4 mL of 2% MPF lidocaine was injected.  Then the left L5 SAP transverse  process junction targeted and bone contact made confirmed with lateral  imaging.  Omnipaque 180 x 0.5 mL demonstrated no intravascular uptake  and 0.75 mL of dexamethasone and lidocaine solution was injected in the  left L4 SAP transverse process junction targeted and bone contact made  confirmed with lateral imaging.   Omnipaque 180 x 0.5 mL demonstrated no  intravascular uptake, followed by injection of 0.75 mL of dexamethasone  and lidocaine solution.  The same procedure was repeated on the right  side at corresponding levels using same needle, injectate and technique.  The patient tolerated the procedure well.  Pre and post injection vitals  stable.  Post injection instructions given.  The pre-injection pain  level was 8 out of 10.  Post injection, 7 out of 10.  We will see him  back for a repeat medial branch block in 1 month.      Erick Colace, M.D.  Electronically Signed     AEK/MEDQ  D:  12/17/2007 11:40:24  T:  12/17/2007 13:35:33  Job:  [id number]

## 2011-01-29 NOTE — Procedures (Signed)
Richard Glass, Richard Glass               ACCOUNT NO.:  [id number]   MEDICAL RECORD NO.:  [id number]          PATIENT TYPE:  REC   LOCATION:  [facility]                          FACILITY:  [facility]   PHYSICIAN:  Erick Colace, M.D.DATE OF BIRTH:  18-Aug-1971   DATE OF PROCEDURE:  10/26/2007  DATE OF DISCHARGE:                               OPERATIVE REPORT   PROCEDURE:  Bilateral sacroiliac injection under fluoroscopic guidance.   INDICATIONS:  Bilateral sacroiliac pain previously relieved by  sacroiliac injection.  Pain is only partially response to narcotic  analgesic medications, interferes with activity and enjoyment of life at  a 6/10 level.  Sleep is poor.   Informed consent was obtained after describing risks and benefits of the  procedure to the patient.  These include bleeding, bruising, infection,  temporary or permanent paralysis, elects proceed and has given written  consent.  The patient placed prone on fluoroscopy table.  Betadine prep,  sterile drape 25-gauge 1-1/2 inch needle was used to incise skin and  subcu tissue 1% lidocaine x2 mL.  Then a 25-gauge 3-inch spinal needle  was inserted first in the left SI joint AP lateral oblique imaging.  Omnipaque 180 x 0.5 mL demonstrated good joint outline no intravascular  take followed by injection 0.5 mL of 40 mg/mL Depo-Medrol and 1 mL of 2%  MPF lidocaine.  The same procedure was repeated on the right side using  same technique injectate and needle.  The patient tolerated procedure  well.  Post injection vitals stable.  Follow up Dr. Riley Kill.      Erick Colace, M.D.  Electronically Signed     AEK/MEDQ  D:  10/26/2007 11:07:24  T:  10/27/2007 08:38:00  Job:  [id number]   cc:   Ranelle Oyster, M.D.  Fax: [phone]

## 2011-01-29 NOTE — Assessment & Plan Note (Signed)
Richard Glass is back regarding his low back pain.  He had bilateral SI joint  injections on October 26, 2007 by Dr. Wynn Banker, and he feels that the  injections made his pain worse.  His pain ranges from a 5 to 7 out of  10, and is most prominent when he stands or bends for long periods of  time.  Pain is sharp, stabbing, tingling.  He has occasional pain in the  left leg and right foot.  Pain interferes with his general activity,  relations with others, and enjoyment of life on a moderate level.  Richard Glass  has quit smoking and lost 15 pounds since I have seen him, mostly by  cutting out the sugar and improving his diet as a whole.   REVIEW OF SYSTEMS:  Notable for the above.  Full review is in the  written health and history section of the chart.   SOCIAL HISTORY:  The patient is single, living with his mother  currently.   PHYSICAL EXAMINATION:  Blood pressure is 129/81, pulse is 98,  respiratory rate is 16, he is satting 97% on room air.  The patient is pleasant, alert and oriented x3.  Affect is bright and  appropriate.  Gait is stable.  He has lost noticeable weight.  We had him bend today, and he had some pain with forward flexion.  Pain  seemed to be more prominent with extension and facet maneuvers, either  side.  He was tender along the lumbar paraspinals and facets at levels  L3, essentially, through S1 bilaterally.  PSIS areas were slightly  tender as well.  Strength was 5 out of 5.  Sensory exam was generally  intact except for some subjective sensory loss in distal limbs.  HEART:  Regular rate.  CHEST:  Clear.  ABDOMEN:  Soft, nontender.   ASSESSMENT:  1. Chronic low back pain.  The patient failed sacroiliac joint      injections.  In discussing his history today, he felt that he did      much better with the medial branch blocks and radiofrequency      ablation.  2. History of peripheral neuropathy.  3. Plantar fasciitis.  4. Left leg length discrepancy.  5. Osteoarthritis of  the knees.  6. Restless leg syndrome.   PLAN:  1. Will send patient back for medial branch blocks at level L3 through      S1 bilaterally with intentional radiofrequency to follow.  2. Continue appropriate exercise, diet, and smoking cessation.  3. Continue Requip 3 mg at bedtime.  4. Continue Lyrica, Kadian, and Vicodin.  I have refilled Vicodin and      Kadian today.  5. I will see Bertin following his injections and procedures with Dr.      Wynn Banker.      Ranelle Oyster, M.D.  Electronically Signed    ZTS/MedQ  D:  11/25/2007 10:27:02  T:  11/25/2007 14:56:44  Job #:  [id number]

## 2011-01-29 NOTE — Assessment & Plan Note (Signed)
Gaspard is back regarding his back pain.  He had good results initially  with the lumbar radiofrequency ablation.  He had SI joints blocked last  month.  The SI joints lasted about a month or so and pain is returning  again.  Pain particularly is worse around the PSIS areas bilaterally.  He has persistent leg pain which has been a long-term issue.  He notes  weight gain.  I asked him about his diet and he is eating healthily but  he is drinking at least five sugared soft drinks a day.  He remains on  Kadian 50 mg daily for baseline pain control with Vicodin p.r.n.  He  uses Lyrica for neuropathic pain.  Requip at night for restless leg  symptoms.  He rates his pain as a 6 out of 10.  He describes it as  burning, stabbing, tingling of his arms.   REVIEW OF SYSTEMS:  Patient reports poor sleep as noted above.  He has  had some weight gain.  Otherwise pertinent positives are in the above  paragraph.  For these see the written health and history section.   SOCIAL HISTORY:  Notable for smoking, still 1-1/2 packs of cigarettes  per day.  He continues to work 30 hours per week.   PHYSICAL EXAMINATION:  VITAL SIGNS:  Blood pressure is 130/72, pulse 68.  GENERAL:  Patient is generally pleasant, alert, and oriented x 3.  He  continues to be overweight, particularly abdominally.  He has pain with  all movements, particularly rotation and flexion today.  SI joint  maneuvers are equivocal to positive.  He has pain in both PSIS areas as  well as the lower lumbar sacral spine.  Strength is 5/5.  Sensory exam  is intact.  HEART:  Regular.  CHEST:  Clear.  ABDOMEN:  Soft.  Nontender.   ASSESSMENT:  1. Chronic low back pain with facet arthropathy as well as sacroiliac      pain.  2. History of painful peripheral neuropathy and plantar fasciitis.  3. History of left leg length discrepancy.  4. Osteoarthritis of the knee.  5. Left rotator cuff syndrome.  6. Restless leg syndrome.   PLAN:  1. We  will retry the sacroiliac blocks per Dr. Wynn Banker bilaterally.  2. Discuss appropriate diet, exercises, and activity.  We reviewed      these in length today.  He needs to cut back his soft drink      consumption.  3. We will increase the Requip to 3 mg at bedtime for restless leg      symptoms.  4. Lyrica at current dose as well as Kadian and Vicodin.  5. I will see the patient back in about six to eight weeks time after      his next set of injections.      Ranelle Oyster, M.D.  Electronically Signed     ZTS/MedQ  D:  09/28/2007 15:53:48  T:  09/29/2007 09:53:16  Job #:  [id number]

## 2011-01-29 NOTE — Procedures (Signed)
Richard Glass, Richard Glass               ACCOUNT NO.:  [id number]   MEDICAL RECORD NO.:  [id number]          PATIENT TYPE:  REC   LOCATION:  [facility]                          FACILITY:  [facility]   PHYSICIAN:  Erick Colace, M.D.DATE OF BIRTH:  06-10-1971   DATE OF PROCEDURE:  05/26/2007  DATE OF DISCHARGE:                               OPERATIVE REPORT   This is lumbar facet medial branch blocks bilateral L5, L4, L3 under  fluoroscopic guidance.   INDICATIONS:  Lumbar facet arthropathy, had lumbar radiofrequency  procedure performed left side August 14, 2006, the right side April 24, 2006.  The pain is axial, only partially relieved with Kadian and  Vicodin and Lyrica.   Informed consent was obtained after describing risks and benefits of  procedure to the patient.  These include bleeding, bruising, infection,  loss of bowel bladder function, temporary or permanent paralysis.  He  elects to proceed and has given written consent.  The patient's pain  level was 10/10 currently and pain does interfere with his general  activity and walking.   The patient placed prone on fluoroscopy table.  Betadine prep, sterile  drape 25-gauge 1-1/2 inch needle was used to anesthetize skin and subcu  tissue 1% lidocaine x2 mL at each of six sites.  Then 22-gauge 3-1/2  inch spinal needle was inserted first left target left S1 SAP sacral ala  junction, bone contact made, confirmed with lateral imaging.  Omnipaque  180 x 0.5 mL demonstrated no intravascular uptake and 0.5 mL of the  above dexamethasone lidocaine solution was injected.  A solution  containing 4 mg/cc dexamethasone x 1 mL and  2 mL of 2% MPF lidocaine.  The left L5 SAP transverse process junction targeted bone contact  confirmed made with lateral imaging.  Omnipaque 180 x 0.5 mL  demonstrated no intravascular uptake then 0.5 mL Depo-Medrol lidocaine  or dexamethasone lidocaine solution injected.  The left L4 SAP  transverse process junction  targeted bone contact made with lateral  imaging.  Omnipaque 180 x 0.5 mL demonstrated no intravascular uptake  then 0.5 mL of dexamethasone lidocaine solution was injected.  The  patient tolerated procedure well.  Pre post injection vitals stable.  This same procedure was repeated on the right side using same injectate,  technique and equipment.  The patient tolerated procedure well.  Pre  injection pain level 10/10 post injection 8/10.  Will give a pain  assessment sheet and the patient will follow up Dr. Riley Kill, possible  repeat RF if his pain level is reduced x 50% over next couple days.      Erick Colace, M.D.  Electronically Signed     AEK/MEDQ  D:  05/26/2007 08:46:33  T:  05/26/2007 14:37:21  Job:  [id number]

## 2011-01-29 NOTE — Assessment & Plan Note (Signed)
Ed is back regarding his back and leg pain.  His back has been bothering  him more lately, and he rates his pain as a 6 to 7 out of 10 today.  He  has been working more, and has had a lot more physical burden placed  upon him.  He describes his pain as stabbing, tingling, burning, dull  and sharp.  Pain interferes with general activity, relations with  others, and enjoyment of life on a moderate level.  Pain, generally in  the back, is localized to his back and is worse with sitting for  prolonged periods of time as well as standing and walking.  He does not  report frank radiation of the pain into his legs, though he does have  his persistent leg pain.  The patient remains on Kadian and Vicodin for  symptoms in the back and leg.  He is on Requip for restless leg at  nighttime.   REVIEW OF SYSTEMS:  Notable for the above.  Full review is in the  written health and history section.   SOCIAL HISTORY:  The patient is single, living with his mother.   PHYSICAL EXAMINATION:  Blood pressure is 138/68, pulse is 89,  respiratory rate 18, satting 96% on room air.  The patient is alert and oriented x3.  Affect is generally bright and  appropriate.  Gait remains slightly antalgic bilaterally.  He remains sensitive to  pinprick and light touch in both legs.  He is abdominally obese today.  He had pain with flexion, but more pain with extension today.  Pain on  palpation of the lower lumbar facets and paraspinals was notable today.  Extension and facet maneuvers were positive.  HEART:  Regular.  CHEST:  Clear.  ABDOMEN:  Soft, nontender.  Cognitively he was appropriate.   ASSESSMENT:  1. Lumbar facet arthropathy.  2. Painful peripheral neuropathy.  3. History of right plantar fasciitis.  4. Leg length discrepancy.  5. Osteoarthritis of left knee.  6. Left rotator cuff syndrome.  7. Restless leg syndrome.   PLAN:  1. Continue Requip, Kadian and Vicodin as written.  2. Lyrica remains at  a 150 mg t.i.d.  3. Will send patient to Dr. Wynn Banker for medial branch blocks at L4-      S1 bilaterally.  4. I will see the patient back pending injections.      Ranelle Oyster, M.D.  Electronically Signed     ZTS/MedQ  D:  05/06/2007 11:54:29  T:  05/07/2007 10:29:01  Job #:  [id number]

## 2011-01-29 NOTE — Assessment & Plan Note (Signed)
Richard Glass is back regarding his chronic back and leg pain.  He has been doing  fairly well with his medications.  He states that he would like to come  off Kadian, as he has read bad things about it regarding constipation.  He feels the Vicodin helps him more.  He uses Vicodin 2 or 3 times a day  for breakthrough symptoms.  We had some inconsistencies with his last  UDS.  The patient rates his pain today at 6-8/10.  Pain is sharp, still  burning, stabbing, and tingling.  Pain interferes with general activity,  relations with others, and enjoyment of life on a mild-to-moderate  level.  Sleep is poor at times.   REVIEW OF SYSTEMS:  Notable for the above.  Full review is in the  written health and history section of the chart.   SOCIAL HISTORY:  The patient is single, living with his mother.  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  This seems  to make him happy though and he likes to exercise.  He lost quite a bit  of wait as a result.   PHYSICAL EXAMINATION:  VITAL SIGNS:  Blood pressure is 132/80, pulse is  88, respiratory rate is 16, and he is sating 100% on room air.  GENERAL:  The patient is pleasant, alert, and oriented x3.  Affect  showing bright and appropriate.  MUSCULOSKELETAL:  He has some decreased range of motion with forward  flexion today, but this is consistent with his baseline.  He is able to  nearly touch his toes with forward flexion today.  Extension was  accomplished to 20-30 degrees, as well as rotation and lateral bending  either side.  Strength is generally 5/5.  Sensation is decreased again  in the distal limbs.  Straight leg testing was equivocal.  NEURO:  Cognitively, he is intact, the insight awareness with tension.  He has lost noticeable weight.  HEART:  Regular.  CHEST:  Clear.  ABDOMEN:  Soft, nontender.   ASSESSMENT:  1. Multifactorial low back pain.  2. Peripheral neuropathy.  3. Plantar fasciitis.  4. Osteoarthritis of the  knees.  5. Restless leg syndrome.  6. Right rotator cuff syndrome/bursitis.   PLAN:  1. Decrease Kadian to 30 mg b.i.d.  2. Adjust his Vicodin to 5/500 one q.6 h. p.r.n. #90.  3. Encouraged activity and exercise as he is doing.  4. We will see him back in 1 month with nursing, 3 months with me.      Richard Glass, M.D.  Electronically Signed    ZTS/MedQ  D:  01/06/2009 13:15:49  T:  01/07/2009 01:29:00  Job #:  [id number]   cc:   Deniece Portela A. Sheffield Slider, M.D.

## 2011-01-29 NOTE — Assessment & Plan Note (Signed)
The patient is back regarding his back pain.  He had about 3-4 weeks of  significant relief with the last set of medial branch blocks performed  on the left side on May 26, 2007, by Erick Colace, M.D.  He  is having some more pain once again centrally at the L3-4 level.  The  pain is worse when he stands and bending for long periods of time.  He  has been active with work.  He has done a lot of home projects as well.  He is on his feet a good amount of the day.  He remains on Kadian 50 mg  daily for basic pain control.  He continues on Lyrica and Requip as well  as neuropathic symptoms.  He is on Vicodin for breakthrough pain.   REVIEW OF SYSTEMS:  Notable for the above.  Weight is stable.  Mood has  been good.  Full review is in the written health and history section of  the chart.   SOCIAL HISTORY:  The patient is single and living with his friend.  He  is working as noted above.   PHYSICAL EXAMINATION:  VITAL SIGNS:  Blood pressure 114/74, pulse 83,  respiratory rate 18, and saturating 98% on room air.  GENERAL:  The patient is pleasant, alert and oriented x3.  Affect is  bright and appropriate.  He walks with a shuffling gait.  The patient  continues to have pain with palpation over the middle lumbar spine,  particularly with extension and facet maneuvers today.  Flexion was fair  with some pain at 40-50 degrees or more of flexion.  Strength is 5/5 in  both legs.  He does have decreased sensation distally.  HEART:  Regular.  CHEST:  Clear.  ABDOMEN:  Soft and nontender.  NEUROLOGY:  Cognitively he is appropriate with normal cranial nerve  examination.   ASSESSMENT:  1. Chronic low back pain with lumbar facet syndrome.  The patient had      positive results with medial branch blocks.  2. Painful peripheral neuropathy.  3. Right plantar fasciitis.  4. Left leg length discrepancy.  5. Osteoarthritis of the knee.  6. Left rotator cuff syndrome.  7. Restless leg  syndrome.   PLAN:  1. Send the patient back to Dr. Wynn Banker for radiofrequency ablation      at L3-4, L4-5, and L5-S1.  We request that he have it done      bilaterally.  2. Refill Kadian 50 mg #30 and Vicodin 5/500 one q.8 hours p.r.n. #75.  3. Refill Xanax, Lyrica, and Requip.  4. I will see the patient back after his injection series.      Ranelle Oyster, M.D.  Electronically Signed     ZTS/MedQ  D:  06/23/2007 09:37:38  T:  06/23/2007 11:21:37  Job #:  [id number]

## 2011-01-29 NOTE — Assessment & Plan Note (Signed)
Richard Glass is back regarding his multiple pain complaints.  He is generally  doing well except for at night when he has some vibrating and tingling  sensations in the feet which keep him up.  He rates his pain at a 2-  4/10.  He is on Kadian, Vicodin and Lyrica currently.  The patient  remains active with work Engineer, materials filtration systems.  He states  that his pain increases with walking, bending and standing.  It also  increases with inactivity.   REVIEW OF SYSTEMS:  Negative except for those things mentioned above.  A  full review is in the written health and history section.   SOCIAL HISTORY:  The patient is single.   PHYSICAL EXAMINATION:  VITAL SIGNS:  Blood pressure is 123/76, pulse is  95, respiratory rate is 17, he is saturating 97% on room air.  GENERAL:  The patient is pleasant, in no acute distress.  He is alert  and oriented x3.  Affect is bright and appropriate.  MUSCULOSKELETAL/NEUROLOGIC:  His gait is slightly antalgic but overall  much improved.  He is somewhat sensitive to pinprick and light touch  with both legs, although his gross sensory discrimination is decreased.  Muscle function remains 4+ to 5/5 throughout.  HEART:  Regular.  CHEST:  Clear.  ABDOMEN:  Soft, nontender.  PSYCHIATRIC:  Cognitively he is appropriate.   ASSESSMENT:  1. Lumbar facet arthropathy.  2. Painful peripheral neuropathy.  3. History of right plantar fasciitis.  4. Leg length discrepancy.  5. Osteoarthritis of the left knee versus tendinitis.  6. Left rotator cuff syndrome.  7. Questionable restless legs syndrome.   PLAN:  1. Begin trial of Requip 0.5 mg half to one q.h.s.  2. Refill Kadian 50 mg #30 and Vicodin 5/500 mg #75.  3. Continue Lyrica 150 mg t.i.d.  4. I will see the patient back in 3 months and with the nurse clinic      in 1 month he will return.      Ranelle Oyster, M.D.  Electronically Signed     ZTS/MedQ  D:  02/10/2007 12:43:56  T:  02/10/2007 13:29:58   Job #:  [id number]

## 2011-01-29 NOTE — Assessment & Plan Note (Signed)
Richard Glass is back regarding his multiple pain issues.  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  He tells [place] that he has been painting some walls and  ceilings and as a result his lower mid back is bothering him more.  The  pain is 6-8/10.  Pain described as sharp, burning, dull, tingling and  stabbing.  Pain interferes with general activity, relations with others,  enjoyment of life on a moderate level.  Sleep is poor.   REVIEW OF SYSTEMS:  Notable for the above.  Full 14-point review is in  the written health and history section.  Other pertinent positives are  above.   SOCIAL HISTORY:  Unchanged essentially.  He is still single, living with  his mother.  He is trying to wean off cigarettes and actually has an  electronic cigarette with him today.   PHYSICAL EXAMINATION:  Blood pressure is 128/73, pulse is 99,  respiratory rate 18, he is sating 96% on room air.  The patient is  generally pleasant, alert, and oriented x3.  He tells lost some weight  and remains out of steady level what appears.  He has some pain in the  back with extension more than flexion today.  He had a band of tight  muscle along the left latissimus at approximately T10-L1.  Strength is  5/5.  Sensation still is decreased distally in the lower extremities.  Cognitively, he is appropriate with good insight and awareness.  Heart  is regular rate.  Chest is clear.  Abdomen is soft and nontender.   ASSESSMENT:  1. Multifactorial low back pain.  2. Peripheral neuropathy.  3. Plantar fasciitis.  4. Osteoarthritis of the knees.  5. Restless leg syndrome.  6. Right rotator cuff syndrome/right bursitis.   PLAN:  1. We will stay with the decreased dosing of Kadian 30 mg 1 p.o.      b.i.d. and Vicodin 5/500 one q.6 h. p.r.n. #90.  His opiate levels      were high on last urine drug screen, so we will check another one      in about a month when he  returns for followup with nursing.  2. Activity:  As he is doing.  3. After informed consent, we injected the left thoracic and lumbar      trigger points with 2 individual trigger point injections      consisting of 2 mL of 1% lidocaine.  The patient tolerated well.      Encouraged ice, stretching, rest breaks at work, etc.  I will see      him back in about 3 months' time.      Ranelle Oyster, M.D.  Electronically Signed     ZTS/MedQ  D:  04/07/2009 12:35:42  T:  04/08/2009 02:15:05  Job #:  [id number]   cc:   Deniece Portela A. Sheffield Slider, M.D.

## 2011-01-29 NOTE — Procedures (Signed)
NAMELANGFORD, CARIAS               ACCOUNT NO.:  [id number]   MEDICAL RECORD NO.:  [id number]          PATIENT TYPE:  REC   LOCATION:  [facility]                          FACILITY:  [facility]   PHYSICIAN:  Erick Colace, M.D.DATE OF BIRTH:  1971/05/14   DATE OF PROCEDURE:  08/27/2007  DATE OF DISCHARGE:                               OPERATIVE REPORT   Mr. Bearse is here for bilateral sacroiliac injection.  He has low back  and buttock pain.  His low to mid back pain has been improved after RF.  Has more pain in buttock.  Also has some left lower extremity tingling  discomfort.  His pain right now is 6/10, gets up to 9 and interferes  with activities 5 to 6/10 level, only partially responsive to narcotic  analgesic medications and other conservative care.   Informed consent was obtained after describing risks and benefits to the  patient.  These include bleeding, bruising, infection, temporary lower  extremity weakness or paralysis.  He elects proceed and has given  written consent.  The patient placed prone on fluoroscopy table.  Betadine prep, sterile drape 25 gauge 1-1/2 inch needle was used to  anesthetize skin and subcu tissue 1% lidocaine x2 mL.  First on the left  side 3-inch 25-gauge spinal needle was inserted left SI joint.  AP  lateral and oblique imaging.  Omnipaque 180 x 0.5 mL demonstrated joint  outline followed by injection of solution containing 0.5 mL of 40 mg per  mL Depo-Medrol and 1 mL of 2% MPF lidocaine.  The procedure was repeated  on the right side using same injectate same technique same equipment.  The patient tolerated procedure well.  Post injection vitals stable.  Follow up with Dr. Riley Kill in 1 month.  Further eval of tingling  discomfort left lower extremity if this persists or worsens.      Erick Colace, M.D.  Electronically Signed     AEK/MEDQ  D:  08/27/2007 10:13:01  T:  08/27/2007 14:41:54  Job:  [id number]

## 2011-01-29 NOTE — Assessment & Plan Note (Signed)
Ed is back regarding his low back pain.  We increased his Kadian to 50  mg twice a day and this seems to have helped, although he is having  problems with constipation.  Topamax was helped him lose weight.  He has  not noticed substantial change going from Lyrica to this.  He stays  active working.  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  His pain  is 6/10 today.  Pain is aching, sometimes stabbing into the back and  left leg.  Sleep is poor at times.  Pain interferes with general  activity, relations with others, and enjoyment of life on a moderate  level.   REVIEW OF SYSTEMS:  Notable for the above.  Full review of systems is in  the written health and history section.  The patient states he is using  suppositories for his constipation.   SOCIAL HISTORY:  The patient is divorced, living with his mother, and  doing odds and ends type jobs essentially.   PHYSICAL EXAMINATION:  VITAL SIGNS:  Blood pressure is 115/72, pulse is  65, respiratory rate 18, and he is saturating 99% on room air.  GENERAL:  The patient is pleasant, alert, and oriented x3.  Affect is  generally bright and appropriate.  Weight is down.  MUSCULOSKELETAL:  He has fair back range of motion with pain with  flexion.  Straight leg testing was equivocal with some tightness still  seen.  Extension causes some discomfort.  Strength is 5/5.  Sensory exam  is normal.  Reflexes are 1+ to 2+.   ASSESSMENT:  1. Multifactorial low back pain.  2. Peripheral neuropathy.  3. Plantar fasciitis.  4. Left leg length discrepancy.  5. Osteoarthritis of the knees.  6. Restless legs syndrome.   PLAN:  1. Maintain Kadian 50 mg b.i.d. and Vicodin 5/500 one q.8 h p.r.n.  2. We will stay with Topamax at current dosing.  3. Discussed in depth his bowel program which he needs to be proactive      with instead of reactive.  4. We will see him back in 3 month in the MD clinic and 1 month in      nurse clinic.      Ranelle Oyster, M.D.  Electronically Signed     ZTS/MedQ  D:  08/03/2008 12:58:41  T:  08/04/2008 01:17:32  Job #:  [id number]

## 2011-01-29 NOTE — Procedures (Signed)
Richard Glass, Richard Glass               ACCOUNT NO.:  [id number]   MEDICAL RECORD NO.:  [id number]           PATIENT TYPE:   LOCATION:                                 FACILITY:   PHYSICIAN:  Erick Colace, M.D.DATE OF BIRTH:  May 14, 1971   DATE OF PROCEDURE:  07/02/2007  DATE OF DISCHARGE:                               OPERATIVE REPORT   PROCEDURE:  Lumbar RF.   INDICATION:  Positive response to medial branch blocks demonstrating  facet mediated pain, his pain is only partially relieved by Kadian and  Vicodin and Lyrica.   Informed consent was obtained after describing the risks and benefits of  the procedure to the patient.  These include bleeding, bruising,  infection, loss of bladder or bowel function, temporary or permanent  paralysis.  He elects to proceed and has given written consent.  The  patient's pain level is 7/10.   PROCEDURE IN DETAIL:  The patient was placed prone on the fluoroscopy  table.  Betadine prep, sterile drape.  A 25 gauge 1-1/2 needle was used  to anesthetize skin and subcu tissue.  Lidocaine 1% x2 mL at each site,  then a 20 gauge 10 cm RF needle with 10 mm curved active tip was  inserted first starting the left S1 SAP sacroiliac junction, bone  contact made and confirmed with lateral imaging.  Motor stem x3 volts  demonstrated no lower extremity twitch followed by injection of 1 mL of  the dexamethasone lidocaine solution and RF lesioning 70 degrees x 70  seconds.  In the left L5 SAP transverse process junction targeted, bone  contact made, confirmed with lateral imaging.  Motor stem x3 volts  demonstrated no lower extremity twitch followed by injection of 1 mL of  dexamethasone lidocaine solution and RF lesioning 70 degrees x 70  seconds.  In the left L4 SAP transverse process junction targeted and  bone contact made confirmed with lateral imaging.  Motor stem x3 volts  demonstrated no lower extremity twitch followed by RF lesioning 70  degrees x 70  seconds followed by injection of 1 mL of dexamethasone  lidocaine solution RF lesioning 70 degrees x 70 seconds.  The patient  tolerated the procedure well.  Pre and post injection vitals stable.      Erick Colace, M.D.  Electronically Signed     AEK/MEDQ  D:  07/02/2007 16:55:07  T:  07/03/2007 15:52:49  Job:  [id number]

## 2011-01-29 NOTE — Procedures (Signed)
Richard Glass, Richard Glass               ACCOUNT NO.:  [id number]   MEDICAL RECORD NO.:  [id number]          PATIENT TYPE:  EMS   LOCATION:  [facility]                          FACILITY:  [facility]   PHYSICIAN:  Erick Colace, M.D.DATE OF BIRTH:  [birth year]   DATE OF PROCEDURE:  07/30/2007  DATE OF DISCHARGE:  07/18/2007                               OPERATIVE REPORT   PROCEDURE:  Right-sided L5 dorsal ramus injection and radiofrequency  neurotomy, right L4 medial branch block and radiofrequency neurotomy,  right L3 medial branch block and radiofrequency neurotomy under  fluoroscopic guidance.   INDICATIONS:  Lumbar facet mediated pain.  He has had relief of left-  sided low back pain due to facet syndrome following radiofrequency  procedure done last month.  He is here to get the right side done.  This  pain is interfering with walking and prolonged sitting.  Pain is only  partially responsive to narcotic analgesic medications.   Informed consent was obtained after describing risks and benefits of the  procedure to the patient.  These include bleeding, bruising, infection,  loss of bowel or bladder function, temporary or permanent paralysis.  He  elects to proceed and has given written consent.  The patient placed  prone on fluoroscopy table.  Betadine prep, sterile drape 25-gauge 1-1/2  inch needle was used to anesthetize the skin and subcu tissue with 1%  lidocaine x2 mL and a 20 gauge 10 cm radiofrequency probe with 10 mm  curved active tip was inserted under fluoroscopic guidance targeting  first the left S1 SAP sacral ala junction, bone contact made confirmed  with lateral imaging.  Targeting the left S1 SAP transverse junction  bone contact made confirmed with lateral imaging.  Motor stim x3 volts  demonstrated no lower extremity twitch followed by injection of 1 mL of  solution containing 1 mL of 4 mg/mL dexamethasone 2 mL of 1% lidocaine  MPF followed by our radiofrequency lesioning  70 degrees x 70 seconds.  Then the right L5 SAP transverse process junction targeted bone contact  made confirmed with lateral imaging.  Motor stim x3 volts demonstrated  no lower extremity twitch followed by injection of 1 mL of the  dexamethasone lidocaine solution and radiofrequency lesioning 70 degrees  x 70 seconds.  Then the right L4 SAP transverse process junction  targeted bone contact made confirmed with lateral imaging.  Motor stem  x3 volts demonstrated no lower extremity twitch followed by injection of  1 mL of dexamethasone lidocaine solution and RF lesioning 70 degrees x  70 seconds.  The patient tolerated procedure well.  Pre post injection  vitals stable.  Post injection instructions given.  Will see back in 1  month.  He complains of some lower down SI pain.  We may need to SI  injection.  If he is doing well at that point will send him back to Dr.  Riley Kill.      Erick Colace, M.D.  Electronically Signed     AEK/MEDQ  D:  07/30/2007 10:18:55  T:  07/31/2007 06:06:09  Job:  [id number]

## 2011-02-01 NOTE — Consult Note (Signed)
NAME:  Richard Glass, Richard Glass NO.:  [id number]   MEDICAL RECORD NO.:  [id number]                    PATIENT TYPE:  REC   LOCATION:                                       FACILITY:   PHYSICIAN:  Zachary George, DO                      DATE OF BIRTH:  04-07-1971   DATE OF CONSULTATION:  12/16/2002  DATE OF DISCHARGE:                                   CONSULTATION   HISTORY OF PRESENT ILLNESS:  The patient returns to clinic today for  reevaluation.  He was last seen on 11/15/2002.  At that time, he underwent  left plantar fascia injection for plantar fasciitis.  He states that the  injection helped significantly.  He requests a right plantar fascia  injection today.  He continues to have lower back pain radiating into his  left lower extremity without significant change.  He does feel like the  medications help him including Naprosyn and Neurontin.  He also takes Norco  5 mg very sparingly.  He continues to take Klonopin for anxiety.  I reviewed  the health and history form and 14-point Review of Systems.   PHYSICAL EXAMINATION:  GENERAL:  Healthy male in no acute distress.  VITAL SIGNS:  Blood pressure 137/89, pulse 86, respirations 20, O2  saturation 98% on room air.  EXTREMITIES:  Examination of the lower extremities reveals significant  tenderness to palpation over the right plantar fascia at the calcaneous.  There is less tenderness on the left today.  NEUROLOGIC:  No new neurologic findings in the lower extremities including  motor, sensory, and reflexes.   IMPRESSION:  1. Bilateral plantar fasciitis, with significant improvement in left plantar     fasciitis following local steroid injection.  2. Degenerative disk disease of lumbar spine.  3. Chronic low back pain with left greater than right lower extremity     radicular symptoms.   PLAN:  1. Right plantar fascia steroid injection.  2. Continue Naprosyn 500 mg b.i.d. with food, #60 with 2 refills.  3.  Continue hydrocodone 5 mg/325 mg sparingly 2 to 3 times per day just as     needed, #60 without refills.  4. Continue Neurontin 600 mg 1 p.o. t.i.d., #90 with 2 refills.  5. Continue Klonopin 0.5 mg 1/2 to 1 p.o. b.i.d. as needed, #30 without     refills.  6. The patient is to return to clinic in one month for reevaluation.   PROCEDURE:  Right plantar fascia steroid injection.   DESCRIPTION OF PROCEDURE:  The procedure was described to the patient in  details including the risks benefits, limitations, alternatives, and  potential side effects. Risks include but are not limited to bleeding,  infection, ligament rupture, nerve injury, failure to relieve pain,  increased pain, and allergic reaction to medications.  The patient  understands and wishes to  proceed.  Informed consent was obtained.   Skin was prepped in the usual fashion with Betadine and alcohol swabs.  The  right plantar fascia was injected at the calcaneus using a 25-gauge 1-1/2  inch needle with 0.5 ml of Kenalog 40 mg/ml plus 1.5 ml 1% Lidocaine.  The  patient tolerated the procedure well.  There were no complications.  Discharge instructions were given.  The patient was instructed on post  injection relative rest x 1 weeks.  He was released in stable condition.   The patient was educated about findings and recommendations and understands.  No barriers to communication.                                               Zachary George, DO    JW/MEDQ  D:  12/16/2002  T:  12/17/2002  Job:  [id number]

## 2011-02-01 NOTE — Discharge Summary (Signed)
NAME:  Richard Glass, Richard Glass                         ACCOUNT NO.:  [id number]   MEDICAL RECORD NO.:  [id number]                   PATIENT TYPE:  INP   LOCATION:  5504                                 FACILITY:  [facility]   PHYSICIAN:  Rebecca L. Jolinda Croak, M.D.          DATE OF BIRTH:  [birth year]   DATE OF ADMISSION:  05/25/2004  DATE OF DISCHARGE:  05/27/2004                                 DISCHARGE SUMMARY   DISCHARGE DIAGNOSES:  1.  Abdominal pain secondary to #2.  2.  Constipation.   PAST MEDICAL HISTORY:  1.  Seizures.  2.  Asthma.  3.  Chronic back pain.   DISCHARGE MEDICATIONS:  Resume all home medications to include:  1.  Albuterol p.r.n.  2.  Allegra.  3.  Clonazepam.  4.  Cymbalta.  5.  Flonase.  6.  Neurontin.  7.  Phenobarbital.  8.  Prilosec.  9.  Temazepam.  10. Zocor.  11. New medication Vicodin 5/500 one tablet b.i.d. p.r.n. for pain.   DISPOSITION:  Patient discharged to home.   FOLLOWUP:  Patient instructed to call [facility] to make an  appointment with Dr. Sheffield Slider within the next week.   CONSULTS:  None.   PROCEDURE:  1.  Abdominal ultrasound.  2.  Acute abdominal series.   BRIEF ADMISSION HISTORY:  This is a 40 year old male with history of  seizures who presented for a two day history of nausea, vomiting, no fevers,  but chills.  Patient also described abdominal pain, intermittent, worse with  eating located in the right upper quadrant, mid epigastrium, and radiating  to the back.  Patient reported 10/10 pain.  He also reported having a  headache day prior to admission.  No rash.  No diarrhea.  No dysuria,  hematuria, and no recent alcohol.  Review of systems was positive for cough,  nausea, vomiting, and myalgias.   ADMISSION LABORATORIES:  Amylase 46, lipase 23.  Blood alcohol level less  than 5.  Metabolic panel with sodium 135, potassium 3.7, chloride 103,  bicarbonate 22, glucose 95, BUN 3, creatinine 0.6, total bilirubin 0.2,  alkaline phosphatase 100, SGOT 29, SGPT 26, total protein 6.2, albumin 3.6,  calcium 8.5.  CBC which was done at the [facility] showed a  white blood cell count of 17.6, hemoglobin of 14.4, and platelet count of  393 with 65% granulocytes.  Urinalysis was negative.   HOSPITAL COURSE:  #1 - ABDOMINAL PAIN:  Considering patient was complaining  of right upper quadrant pain with nausea and vomiting, wanted to rule out  any gallbladder disease or pancreatitis.  As stated above, amylase and  lipase were within normal limits making pancreatitis unlikely.  Therefore,  patient had an abdominal ultrasound and acute abdominal series to rule out  any other intra-abdominal pathology.  Abdominal ultrasound showed negative  examination except for possible slight fatty change of the liver, no  evidence of  biliary tract stone disease or obstruction.  Acute abdominal  series showed no evidence of dilated bowel loops, moderate colonic stool,  low lung volumes, and no active disease.  Considering laboratory work and  patient's improvement after bowel movement, it was considered that patient's  abdominal pain was likely secondary to constipation.  At the time of  discharge patient was tolerating p.o., however, was still complaining of  mild abdominal pain and back pain.  Patient does have history of chronic  back pain and generally takes Vicodin p.r.n. at home.  Therefore, was given  a prescription for 5/500 Vicodin one b.i.d. p.r.n. for his pain as was felt  more back pain than abdominal pain.  Patient urged to take stool softeners  to prevent constipation.  During hospitalization patient had Phenergan and  Dilaudid initially to control symptoms but again, at the time of discharge  patient was using only Tylenol for pain and was tolerating p.o. fine.   #2 - RESPIRATORY:  Considering patient had significant cough at the time of  admission, questionable if patient had pneumonia which was causing  the right  upper quadrant abdominal pain.  Therefore, chest x-ray was done to rule out  any type of pneumonia and patient was initially started on Tequin 400 mg IV.  He only received one dose of this as chest x-ray was normal.  Patient does  have a history of asthma and was given albuterol p.r.n. which he did not  need during hospitalization.   #3 - OTHER MEDICAL PROBLEMS:  Stable during hospitalization and none of his  medications were changed on a long-term basis.   DISCHARGE LABORATORIES:  Phenobarbital level of 23.8, within normal limits.  Comprehensive metabolic panel which showed a sodium of 135, potassium of  3.5, chloride of 105, bicarbonate of 25, glucose of 91, BUN of 3, creatinine  0.7, bilirubin 0.4, alkaline phosphatase 90, SGOT 21, SGPT 25, total protein  5.9, albumin 3.2,  calcium 8.3.  CBC showed a white blood cell count of  14.9, hemoglobin 13.7, hematocrit 39.4, platelet count 332.      Richard Auerbach, MD                          Richard Glass. Jolinda Croak, M.D.    AD/MEDQ  D:  05/27/2004  T:  05/28/2004  Job:  [id number]   cc:   Deniece Portela A. Sheffield Slider, M.D.  Fax: [phone]

## 2011-02-01 NOTE — Op Note (Signed)
[place]  Patient:    Richard Glass, Richard Glass                      MRN: [id number] Proc. Date: 02/01/00 Adm. Date:  [id number] Disc. Date: [id number] Attending:  Fernande Boyden CC:         Wayne A. Sheffield Slider, M.D.             Genene Churn. Love, M.D.             [facility]                           Operative Report  PREOPERATIVE DIAGNOSES: 1. Obstructive sleep apnea. 2. Nasal septal deviation with obstruction. 3. Hypertrophic inferior turbinates. 4. Hypertrophic tonsils with obstruction.  POSTOPERATIVE DIAGNOSES: 1. Obstructive sleep apnea. 2. Nasal septal deviation with obstruction. 3. Hypertrophic inferior turbinates. 4. Hypertrophic tonsils with obstruction.  PROCEDURE PERFORMED: 1. Nasal septoplasty. 2. Bilateral submucous resection of the inferior turbinates. 3. Bilateral tonsillectomy. 4. Uvulopalatopharyngoplasty.  SURGEON:  Gloris Manchester. Lazarus Salines, M.D.  ANESTHESIA:  General orotracheal.  BLOOD LOSS:  Minimal.  COMPLICATIONS:  None.  FINDINGS:  The external nose rather flushy.  Intranasally, a corrugated septum with high deflection to the left in the mid anterior nose, deviation of the caudal edge of the quadrangular cartilage into the right nasal vestibule and upon dissection, heavy fibrosis consistent with either severe trauma or prior surgery with some missing cartilage in the mid quadrangular cartilage and adherence of one septal flap to the other.  Partial reduction of the anterior aspect of the right inferior turbinate.  3+ tonsils imbedded in cryptic debris and fibrosis.  A long fixed central uvula and soft palate.  No significant adenoid pad.  DESCRIPTION OF PROCEDURE:  With the patient in a comfortable supine position, general orotracheal anesthesia was induced without difficulty.  At an appropriate level, the patient was placed in a slight sitting position.  A saline moistened throat pack was placed.  Nasal vibrissae were  trimmed. Cocaine crystals, 200 mg total were applied on the cotton caries to the anterior ethmoid and sphenopalatine ganglion region on both sides.  Cocaine solution, 160 mg total was applied on 0.5 x 3 inch cottonoids to both sides of the septal mucosa.  Finally, 1% Xylocaine with 1:100,000 epinephrine, 6 cc total was infiltrated into the submucoperichondrial plane of the septum on both sides, into the membranous columella, into the anterior floor of the nose, and into the nasal spine region.  Several minutes were allowed for hemostasis and anesthesia to take effect.  A sterile preparation and draping of the mid face was accomplished.  The materials were removed from the nose and observed to be intact and correct in number.  The findings were as described above.  A left-sided hemitransfixion incision was sharply executed, and carried down to the caudal edge of the quadrangular cartilage, and continued down into a left floor tunnel.  A small right floor incision was executed as well.  Floor tunnels were elevated on both sides and carried back to the vomer and brought forward along the maxillary crest.  A left submucoperichondrial dissection was begun.  The caudal strut was thickened, fibrosed, and with an interruption near the tip of the nose consistent with a prior surgical procedure.  There was a roughly 1 cm defect of cartilage with the two flaps fibrotically attached densely.  There was still cartilage along  the maxillary crest and the dorsal strut of the quadrangular cartilage was intact.  Dissection was carried along these two to the perpendicular plate.  The two septal flaps were separately sharply and bluntly.  A small linear laceration in the left septal flap was generated in this ________, but the right flap was intact.  The dissection was carried further in the perpendicular plate posteriorly, brought down to the floor of the nose and communicated with the floor tunnel  and brought forward.  The chondral ethmoid junction was identified and lysed in the opposite submucoperiosteal plane.  The perpendicular plate was elevated. The superior perpendicular plate was lysed with an open Jansen-Middleton forceps.  The inferior portion was submucosally dissected and rocked free with a closed Jansen-Middleton forceps.  Additional bony spurs along the vomer were carefully removed including a prominent spur on the left side.  At this point, the inferior 1-2 mm of the caudal strut was removed where it was heavily spurred and deviated off the maxillary crest into the right nose. This allowed the septum to trap door more freely into the midline.  There was relatively thin dorsal and caudal strut consistent with the prior surgery, but both were fairly sturdy and no evidence of nasal clots.  A pocket was generated between the medial crurae of the lower lateral cartilages.  The caudal strut of the quadrangular cartilage was secured to the nasal spine with a figure-of-eight 4-0 PDS suture.  The septum was tucked into the intracrural pocket and the 4-0 chromic suture was brought out the external columella to support this.  The septum at this point was _________ in the midline with good support of the dorsum of the nose and improved airway on both sides.  All incisions were closed with interrupted 4-0 chromic after ascertaining hemostasis and evacuating all remaining blood and small bone chips from the septal tunnel.  Just prior to completing the septal process, inferior turbinates were infiltrated with additional 1% Xylocaine with 1:100,000 epinephrine, 4 cc total.  Beginning on the right side, the partially reduced turbinate was further reduced just behind the nasal valve by removing a small amount of bone and lateral mucosa.  Working toward the posterior pole, a little more of the mid aspect was removed in the same fashion.  Bony spicules were removed.  The posterior  pole was suctioned and coagulated for hemostasis, as was the cut  edge.  The turbinate was outfractured and this side was completed.  On the left side, a standard submucous resection was performed.  The hood of the turbinate was lysed behind the nasal valve.  The medial mucosa was incised in an anterior up sloping fashion.  The turbinate was infractured.  Using angled turbinate scissors, turbinate bone and lateral mucosa were resected in a posterior down sloping fashion, taking virtually all the anterior pole and leaving virtually all the posterior pole.  Once again, bony spicules were carefully removed.  The free edges were cauterized for hemostasis.  The turbinate was outfractured and the flap was laid back down and this side was completed.  0.040 reinforced Silastic splints were both sides of the septum in the standard fashion and secured there with a 3-0 nylon stitch.  Double thickness Neosporin impregnated tonsil packs were placed along the inferior turbinates on each side.  Finally, a 6.5 mm nasal trumpet was shortened to go to the nasopharynx and was placed on each side of the nose to serve as part of the nasal packing, but allowing some  airway as well.  This completed the nasal procedure.  Hemostasis was observed.  At this point, the patient was placed in Trendelenburg.  The pharynx was suctioned free and the throat pack was removed.  The drapes were adjusted. Taking care to protect lips, teeth, and endotracheal tube, the Crowe-Davis mouthgag was introduced, expanded for visualization, and suspended from the Mayo stand in the standard fashion.  The findings were as described above. The palate retractor and mirror were used to visualize the nasopharynx with the findings as described above.  0.5% Xylocaine with 1:200,000 epinephrine, 6 cc total was infiltrated into the peritonsillar planes for intraoperative hemostasis.  A tattoo mark was placed in the office at the palatal  muscular dimple was identified.  After allowing several minutes for hemostasis to take effect, the coagulating cautery was used to mark the blood arch across the tattoo point and down on to the anterior pillar on each side.  Beginning on the left side, the dissection was carried through the anterior pillar with both cutting and coagulating cautery down to the capsule of the tonsil, retracting the tongue more medially with an Allis clamp.  The tonsil was dissected free with muscular fossa from inferiorly upward.  The tonsil was removed in its entirety as determined by examination of both tonsil and fossa, but remaining pedicles on superior soft palatal attachment.  A small additional quantity of cautery rendered the fossa hemostatic.  A small portion of the posterior pole was resected en bloc with the tonsil.  After completely dissecting the left tonsil, the right side was done in the same fashion.  Following this, the dissection was carried down from the midline of the soft palate to the thickness of the soft palate with a slightly beveled approach, leaving a longer posterior mucosal flap.  The fascial arch including the uvula was resected en bloc with both tonsils passed off as specimen.  Hemostasis was observed.  Beginning in the midline, the posterior mucosa was reapproximated anteriorly with 3-0 Vicryl sutures.  The closure was carried across the midline to each side, tucking the tissues forward and laterally, making more rectangular closure.  The anterior tonsillar pillar was closed with the posterior tonsillar pillar to the upper one-half of the tonsillar fossa on each side. Very slight linear banding across the posterior pharynx suggested adequate tension had been applied.  After closing that portion, the lower portion could not be closed without undue tension.  Hemostasis was observed.  At this point, the mouthgag was relaxed for several minutes.  Upon re-expansion,  hemostasis was persistent.  At this point, the procedure was completed.  The mouthgag was relaxed and removed.  Dental status was intact. The patient was returned to anesthesia, awakened, extubated, and transferred to recovery in stable condition.  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  Also noted to have poor nasal breathing and sleep apnea with a sleep study showing an apnea index of 30. Hence, the indication for the several components of todays procedure. Anticipate a routine postoperative recovery with attention to analgesia, antibiosis, hydration, and observation for bleeding, emesis, or airway compromise.  Will also observe to make sure that he has not had any compromise of his seizure activity. DD:  02/01/00 TD:  02/06/00 Job: [id number]

## 2011-02-25 ENCOUNTER — Other Ambulatory Visit: Payer: Self-pay | Admitting: Neurology

## 2011-02-25 DIAGNOSIS — R51 Headache: Secondary | ICD-10-CM

## 2011-02-25 DIAGNOSIS — G40209 Localization-related (focal) (partial) symptomatic epilepsy and epileptic syndromes with complex partial seizures, not intractable, without status epilepticus: Secondary | ICD-10-CM

## 2011-02-26 ENCOUNTER — Ambulatory Visit
Admission: RE | Admit: 2011-02-26 | Discharge: 2011-02-26 | Disposition: A | Discharge status: Home / Self Care | Payer: Medicare Other | Source: Ambulatory Visit | Attending: Neurology | Admitting: Neurology

## 2011-02-26 DIAGNOSIS — R51 Headache: Secondary | ICD-10-CM

## 2011-02-26 DIAGNOSIS — G40209 Localization-related (focal) (partial) symptomatic epilepsy and epileptic syndromes with complex partial seizures, not intractable, without status epilepticus: Secondary | ICD-10-CM

## 2011-04-17 ENCOUNTER — Other Ambulatory Visit: Payer: Self-pay | Admitting: Family Medicine

## 2011-04-17 NOTE — Telephone Encounter (Signed)
Refill request

## 2011-05-01 ENCOUNTER — Other Ambulatory Visit: Payer: Self-pay | Admitting: Family Medicine

## 2011-05-01 NOTE — Telephone Encounter (Signed)
Refill request

## 2011-05-18 DIAGNOSIS — R930 Abnormal findings on diagnostic imaging of skull and head, not elsewhere classified: Secondary | ICD-10-CM

## 2011-05-18 HISTORY — DX: Abnormal findings on diagnostic imaging of skull and head, not elsewhere classified: R93.0

## 2011-05-21 ENCOUNTER — Encounter: Payer: Self-pay | Admitting: Family Medicine

## 2011-05-21 ENCOUNTER — Ambulatory Visit (INDEPENDENT_AMBULATORY_CARE_PROVIDER_SITE_OTHER): Payer: Medicare Other | Admitting: Family Medicine

## 2011-05-21 VITALS — BP 122/74 | HR 93 | Temp 98.1°F | Wt 184.0 lb

## 2011-05-21 DIAGNOSIS — R071 Chest pain on breathing: Secondary | ICD-10-CM

## 2011-05-21 DIAGNOSIS — J019 Acute sinusitis, unspecified: Secondary | ICD-10-CM

## 2011-05-21 DIAGNOSIS — R109 Unspecified abdominal pain: Secondary | ICD-10-CM | POA: Insufficient documentation

## 2011-05-21 DIAGNOSIS — R10A1 Flank pain, right side: Secondary | ICD-10-CM | POA: Insufficient documentation

## 2011-05-21 DIAGNOSIS — R0789 Other chest pain: Secondary | ICD-10-CM

## 2011-05-21 MED ORDER — ALPRAZOLAM 0.5 MG PO TABS: 0.5000 mg | ORAL_TABLET | Freq: Two times a day (BID) | ORAL | Status: DC | PRN

## 2011-05-21 MED ORDER — CELECOXIB 200 MG PO CAPS: 200.0000 mg | ORAL_CAPSULE | Freq: Two times a day (BID) | ORAL | Status: DC

## 2011-05-21 NOTE — Progress Notes (Signed)
  Subjective:    Patient ID: Richard Glass, male    DOB: Dec 05, 1970, 40 y.o.   MRN: [id number]  HPI 5 days of right sided pain, onset insidious.  Worse with sitting and coughing.  Pain 8/10, stable, not worsening.  No new physical activities, no trauma.  No association with foods.meals.  Difficult to describe character for patient.  No radiation.  No fever, nausea, vomiting, constipation, diarrhea, dyspnea, dysuria.    Review of Systems See HPI    Objective:   Physical Exam  GEN: Alert & Oriented, No acute distress.   CV:  Regular Rate & Rhythm, no murmur Respiratory:  Normal work of breathing, CTAB. Abd:  + BS, soft, no tenderness to palpation, no rebound or guarding. Back: No rash, Very tender to palpation over right lower ribs on side.  No flank pain.  No abdominal or pelvic pain.  States back pain at baseline, unable to elicit significant paraspinal tenderness        Assessment & Plan:

## 2011-05-21 NOTE — Assessment & Plan Note (Signed)
Ddx to include rib stress fracture, Muscle strain, or radicular back pain.  Patient declines xray.  No evidence on exam for abdominal or lung etiology.  Given red flags for return.  Advised not to use "binder" as he repeatedly asked during the visit.  I refilled his celebrex and recommended he continue to take tizanidine rxed by neurology for migraines.  He will also continue to take vicodin and xanax per PCP.  I have refilled his xanax for one month, and asked him to make follow-up with PCP before his vicodin needs refilling in mid October.

## 2011-05-21 NOTE — Patient Instructions (Addendum)
Take your celebrex, and other medicines for pain and muscle relaxants.  I think you may have a muscle strain or rib fracture. Do not "wrap your ribs" instead take slow big breaths from time to time to make sure you lungs stay open. You decided to wait on the xray today- follow-up if you notice worsening pain trouble breathing, or other signs of sickness such as fever, nausea, vomiting. Make follow-up appointment for Dr. Sheffield Slider

## 2011-05-21 NOTE — Progress Notes (Signed)
Addended by: Macy Mis on: 05/21/2011 09:58 AM   Modules accepted: Orders

## 2011-06-04 ENCOUNTER — Ambulatory Visit (INDEPENDENT_AMBULATORY_CARE_PROVIDER_SITE_OTHER): Payer: Medicare Other | Admitting: Family Medicine

## 2011-06-04 ENCOUNTER — Encounter: Payer: Self-pay | Admitting: Family Medicine

## 2011-06-04 VITALS — BP 116/76 | HR 79 | Temp 98.3°F | Ht 70.0 in | Wt 182.0 lb

## 2011-06-04 DIAGNOSIS — K644 Residual hemorrhoidal skin tags: Secondary | ICD-10-CM

## 2011-06-04 HISTORY — DX: Residual hemorrhoidal skin tags: K64.4

## 2011-06-04 MED ORDER — HYDROCORTISONE 2.5 % RE CREA: TOPICAL_CREAM | RECTAL | Status: DC

## 2011-06-04 NOTE — Progress Notes (Signed)
  Subjective:     Richard Glass is a 40 y.o. male who presents for evaluation of rectal bleeding. Onset of symptoms was yesterday morning, in which Richard Glass noticed red blood staining the toilet paper. He also notes a 5 day history of diarrhea. He endorses 15 bowel movements in the last two days. Symptoms have been unchanged since that time. Symptoms include: bleeding which only occurs with bowel movements, and abdominal burning.  Patient denies maroon colored stools, melena, weight loss and history of rectal bleeding or hemorrhoids.  He also denies cramping abdominal pain, nausea, vomiting and fever. He does not know of any sick contacts of consumption of spoiled food. Treatment to date has been bismuth sulfate and loperamide for the diarrhea. Neither has been helpful. He has not tried any treatment for the rectal bleeding.     Review of Systems A comprehensive review of systems was negative except for: Constitutional: positive for chills Respiratory: positive for cough   Objective:    BP 116/76  Pulse 79  Temp(Src) 98.3 F (36.8 C) (Oral)  Ht 5\' 10"  (1.778 m)  Wt 182 lb (82.555 kg)  BMI 26.11 kg/m2 Rectal: external hemorrhoid on lower lower rim of anal sphincter; FOB (+); internal hemorrhoids and small bleeding fissure on internal anal sphincter seen through anoscope  Abdominal: Soft, non-distended, no guarding, non-tender, hypoactive bowel sounds x 4 quadrants   Assessment:    External hemorrhoid Internal hemorrhoids Anal fissure   Plan:    Discussed the diagnosis with the patient, including plans for treatment and expected course. Topical corticosteroid per medication orders. Follow up with PCP in 4 weeks or as needed. At the time of follow-up the patient will bring a home fecal-occult blood test back; If the results are postitive, then refer patient for colonoscopy  The patient noted understanding of the clinical scenario and agreed with the plan.

## 2011-06-04 NOTE — Patient Instructions (Signed)
Dear Richard Glass,   Thank you for coming to clinic today. I hope that you're bleeding will resolve shortly. Please read below for instructions:  1. Hemorrhoids: I have ordered a medication called Anusol HC to your pharmacy. If you would like to use an over the counter product, then that would be fine as well. The ointments usually work better than the creams.  2. The diarrhea should resolve on its own. It is likely the result of a viral infection. Please call if it worsen.  3. One week after the diarrhea resolves and you have no blood on the toilet paper, please use the cards to collect stool samples. Then bring them back when you are completed.   Call us if you need anything.   Sincerely,   Dr. Clinton Sawyer

## 2011-06-08 ENCOUNTER — Encounter: Payer: Self-pay | Admitting: Family Medicine

## 2011-06-08 DIAGNOSIS — R519 Headache, unspecified: Secondary | ICD-10-CM

## 2011-06-08 DIAGNOSIS — R569 Unspecified convulsions: Secondary | ICD-10-CM

## 2011-06-13 ENCOUNTER — Other Ambulatory Visit: Payer: Self-pay | Admitting: Family Medicine

## 2011-06-13 DIAGNOSIS — E7800 Pure hypercholesterolemia, unspecified: Secondary | ICD-10-CM

## 2011-06-13 NOTE — Telephone Encounter (Signed)
Refill request

## 2011-06-17 ENCOUNTER — Other Ambulatory Visit: Payer: Self-pay | Admitting: Family Medicine

## 2011-06-17 DIAGNOSIS — R569 Unspecified convulsions: Secondary | ICD-10-CM

## 2011-06-17 DIAGNOSIS — G894 Chronic pain syndrome: Secondary | ICD-10-CM

## 2011-06-17 MED ORDER — ALPRAZOLAM 0.5 MG PO TABS: 0.5000 mg | ORAL_TABLET | Freq: Two times a day (BID) | ORAL | Status: DC | PRN

## 2011-06-17 MED ORDER — PHENOBARBITAL 64.8 MG PO TABS: ORAL_TABLET | ORAL | Status: DC

## 2011-06-17 NOTE — Telephone Encounter (Signed)
Will address decreasing Alprazolam on next office visit

## 2011-06-18 ENCOUNTER — Ambulatory Visit (INDEPENDENT_AMBULATORY_CARE_PROVIDER_SITE_OTHER): Payer: Medicare Other | Admitting: Family Medicine

## 2011-06-18 ENCOUNTER — Encounter: Payer: Self-pay | Admitting: Family Medicine

## 2011-06-18 VITALS — BP 127/84 | HR 86 | Wt 185.0 lb

## 2011-06-18 DIAGNOSIS — E785 Hyperlipidemia, unspecified: Secondary | ICD-10-CM

## 2011-06-18 DIAGNOSIS — R071 Chest pain on breathing: Secondary | ICD-10-CM

## 2011-06-18 DIAGNOSIS — G894 Chronic pain syndrome: Secondary | ICD-10-CM

## 2011-06-18 DIAGNOSIS — K644 Residual hemorrhoidal skin tags: Secondary | ICD-10-CM

## 2011-06-18 DIAGNOSIS — E663 Overweight: Secondary | ICD-10-CM

## 2011-06-18 DIAGNOSIS — R0789 Other chest pain: Secondary | ICD-10-CM

## 2011-06-18 DIAGNOSIS — R569 Unspecified convulsions: Secondary | ICD-10-CM

## 2011-06-18 DIAGNOSIS — R197 Diarrhea, unspecified: Secondary | ICD-10-CM | POA: Insufficient documentation

## 2011-06-18 DIAGNOSIS — IMO0002 Reserved for concepts with insufficient information to code with codable children: Secondary | ICD-10-CM

## 2011-06-18 LAB — POCT URINALYSIS DIPSTICK
Bilirubin, UA: NEGATIVE
Glucose, UA: NEGATIVE
Ketones, UA: NEGATIVE
Leukocytes, UA: NEGATIVE
Nitrite, UA: NEGATIVE
Protein, UA: NEGATIVE
Spec Grav, UA: 1.01
Urobilinogen, UA: 0.2
pH, UA: 6

## 2011-06-18 LAB — COMPREHENSIVE METABOLIC PANEL
ALT: 13 U/L (ref 0–53)
AST: 12 U/L (ref 0–37)
Albumin: 4.6 g/dL (ref 3.5–5.2)
Alkaline Phosphatase: 83 U/L (ref 39–117)
BUN: 8 mg/dL (ref 6–23)
CO2: 22 mEq/L (ref 19–32)
Calcium: 9.1 mg/dL (ref 8.4–10.5)
Chloride: 106 mEq/L (ref 96–112)
Creat: 0.85 mg/dL (ref 0.50–1.35)
Glucose, Bld: 133 mg/dL — ABNORMAL HIGH (ref 70–99)
Potassium: 3.8 mEq/L (ref 3.5–5.3)
Sodium: 140 mEq/L (ref 135–145)
Total Bilirubin: 0.2 mg/dL — ABNORMAL LOW (ref 0.3–1.2)
Total Protein: 6.8 g/dL (ref 6.0–8.3)

## 2011-06-18 LAB — POCT UA - MICROSCOPIC ONLY

## 2011-06-18 LAB — LDL CHOLESTEROL, DIRECT: Direct LDL: 90 mg/dL

## 2011-06-18 MED ORDER — ALPRAZOLAM 0.5 MG PO TABS: 0.5000 mg | ORAL_TABLET | Freq: Two times a day (BID) | ORAL | Status: DC | PRN

## 2011-06-18 MED ORDER — HYDROCODONE-ACETAMINOPHEN 5-500 MG PO TABS: 1.0000 | ORAL_TABLET | Freq: Three times a day (TID) | ORAL | Status: DC | PRN

## 2011-06-18 NOTE — Progress Notes (Signed)
  Subjective:    Patient ID: Richard Glass, male    DOB: 1971/05/11, 40 y.o.   MRN: [id number]  HPI right side pain without history of injury for the past 2 months increased with moving in a twisting motion or bending forward increases with cough. No change with eating. No UTI symptoms.  His had diarrhea since 05/30/11 and no longer hurts to defecate and is not seeing pink blood in the toilet bowl. He continues 7 bowel movements daily. He sees bright red blood when wiping. Has epigastric burning but no other abdominal pain.  Evaluated by Dr. Anne Hahn for headaches and most recently was started on tizanidine.  Headaches are better but still occuring. 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   He continues not to smoke but is using an electronic cigarette with some success. Currently it is non-functional.  He continues chronic pain in his back and his left shoulder.  The right shoulder is better. Has whole left hand numbness when he lies on that arm.        Review of Systems  Constitutional: Negative for fever and activity change.  HENT: Negative for congestion.   Respiratory: Negative for cough and wheezing.   Cardiovascular: Negative for chest pain.  Genitourinary: Negative for dysuria, urgency and hematuria.  Musculoskeletal: Positive for back pain and arthralgias.  Neurological: Positive for tremors and headaches. Negative for seizures.  Psychiatric/Behavioral: Negative for behavioral problems and dysphoric mood.       Objective:   Physical Exam  Constitutional: He appears well-developed and well-nourished.  Cardiovascular: Normal rate and regular rhythm.   Pulmonary/Chest: Effort normal and breath sounds normal. No respiratory distress. He has no wheezes.  Abdominal: Soft. He exhibits no distension and no mass. There is no tenderness.  Musculoskeletal:       Pain localized to low back and reproduced by extension. No CVA tenderness. Tremors in both arms  when he raises them which is chronic since his head injury  Sciatica testing negative  Neurological: He displays normal reflexes. He exhibits normal muscle tone.  Psychiatric: He has a normal mood and affect. His behavior is normal.          Assessment & Plan:

## 2011-06-18 NOTE — Assessment & Plan Note (Signed)
No wt loss. No recent antibiotic exposure, but will check C dif and fetal lactoferrin.

## 2011-06-18 NOTE — Assessment & Plan Note (Signed)
controlled 

## 2011-06-18 NOTE — Patient Instructions (Signed)
Return to see Dr Sheffield Slider in 2 weeks.  Bring a stool BM sample in for testing in the bottle we give you  Good to see you today.

## 2011-06-18 NOTE — Assessment & Plan Note (Signed)
Check CBC. Consider colonoscopy

## 2011-06-18 NOTE — Assessment & Plan Note (Signed)
Probably relates to his back degenerative joint disease  but will check urinalysis and CMET

## 2011-06-18 NOTE — Assessment & Plan Note (Signed)
No significant wt change

## 2011-06-19 ENCOUNTER — Encounter: Payer: Self-pay | Admitting: Family Medicine

## 2011-06-19 ENCOUNTER — Other Ambulatory Visit: Payer: Self-pay | Admitting: Family Medicine

## 2011-06-19 DIAGNOSIS — R197 Diarrhea, unspecified: Secondary | ICD-10-CM

## 2011-06-19 LAB — DRUG SCREEN, URINE
Amphetamine Screen, Ur: NEGATIVE
Barbiturate Quant, Ur: POSITIVE — AB
Benzodiazepines.: NEGATIVE
Cocaine Metabolites: NEGATIVE
Creatinine,U: 43.2 mg/dL
Marijuana Metabolite: NEGATIVE
Methadone: NEGATIVE
Opiates: POSITIVE — AB
Phencyclidine (PCP): NEGATIVE
Propoxyphene: NEGATIVE

## 2011-06-19 LAB — CBC WITH DIFFERENTIAL/PLATELET
Basophils Absolute: 0 10*3/uL (ref 0.0–0.1)
Basophils Relative: 0 % (ref 0–1)
Eosinophils Absolute: 0.3 10*3/uL (ref 0.0–0.7)
Eosinophils Relative: 2 % (ref 0–5)
HCT: 47.8 % (ref 39.0–52.0)
Hemoglobin: 16 g/dL (ref 13.0–17.0)
Lymphocytes Relative: 32 % (ref 12–46)
Lymphs Abs: 4.1 10*3/uL — ABNORMAL HIGH (ref 0.7–4.0)
MCH: 31.8 pg (ref 26.0–34.0)
MCHC: 33.5 g/dL (ref 30.0–36.0)
MCV: 95 fL (ref 78.0–100.0)
Monocytes Absolute: 0.8 10*3/uL (ref 0.1–1.0)
Monocytes Relative: 6 % (ref 3–12)
Neutro Abs: 7.8 10*3/uL — ABNORMAL HIGH (ref 1.7–7.7)
Neutrophils Relative %: 60 % (ref 43–77)
Platelets: 285 10*3/uL (ref 150–400)
RBC: 5.03 MIL/uL (ref 4.22–5.81)
RDW: 14 % (ref 11.5–15.5)
WBC: 13 10*3/uL — ABNORMAL HIGH (ref 4.0–10.5)

## 2011-06-19 LAB — SEDIMENTATION RATE: Sed Rate: 1 mm/hr (ref 0–16)

## 2011-06-20 LAB — FECAL LACTOFERRIN, QUANT: Lactoferrin: NEGATIVE

## 2011-06-20 LAB — CLOSTRIDIUM DIFFICILE EIA: CDIFTX: NEGATIVE

## 2011-06-25 ENCOUNTER — Telehealth: Payer: Self-pay | Admitting: Family Medicine

## 2011-06-25 MED ORDER — IPRATROPIUM-ALBUTEROL 18-103 MCG/ACT IN AERO: 2.0000 | INHALATION_SPRAY | Freq: Four times a day (QID) | RESPIRATORY_TRACT | Status: DC | PRN

## 2011-06-25 NOTE — Telephone Encounter (Signed)
I called Richard Glass who says the diarrhea is a little better. Informed him that the tests for intestinal infection were negative.   Also informed him that his Combivent refill will change due to the company changing it's delivery method to a spring mechanism to avoid CFL's

## 2011-07-02 ENCOUNTER — Ambulatory Visit: Payer: Medicare Other | Admitting: Family Medicine

## 2011-07-21 ENCOUNTER — Other Ambulatory Visit: Payer: Self-pay | Admitting: Family Medicine

## 2011-07-21 NOTE — Telephone Encounter (Signed)
Refill request

## 2011-09-07 ENCOUNTER — Other Ambulatory Visit: Payer: Self-pay | Admitting: Family Medicine

## 2011-09-07 DIAGNOSIS — E782 Mixed hyperlipidemia: Secondary | ICD-10-CM

## 2011-09-11 NOTE — Telephone Encounter (Signed)
Refill request

## 2011-09-13 ENCOUNTER — Encounter: Payer: Self-pay | Admitting: Family Medicine

## 2011-09-17 ENCOUNTER — Other Ambulatory Visit: Payer: Self-pay | Admitting: Family Medicine

## 2011-09-17 DIAGNOSIS — K219 Gastro-esophageal reflux disease without esophagitis: Secondary | ICD-10-CM

## 2011-09-18 NOTE — Telephone Encounter (Signed)
Refill request

## 2011-10-03 ENCOUNTER — Telehealth: Payer: Self-pay | Admitting: Family Medicine

## 2011-10-03 MED ORDER — TIZANIDINE HCL 2 MG PO CAPS: ORAL_CAPSULE | ORAL | Status: DC

## 2011-10-03 NOTE — Telephone Encounter (Signed)
Neurology consultant increased Tizanidine to 3-4 x daily. His headaches continue.

## 2011-10-07 ENCOUNTER — Ambulatory Visit (INDEPENDENT_AMBULATORY_CARE_PROVIDER_SITE_OTHER): Payer: Medicare Other | Admitting: Family Medicine

## 2011-10-07 ENCOUNTER — Encounter: Payer: Self-pay | Admitting: Family Medicine

## 2011-10-07 VITALS — BP 118/75 | HR 82 | Temp 98.1°F | Ht 68.0 in | Wt 187.5 lb

## 2011-10-07 DIAGNOSIS — H60399 Other infective otitis externa, unspecified ear: Secondary | ICD-10-CM

## 2011-10-07 DIAGNOSIS — J392 Other diseases of pharynx: Secondary | ICD-10-CM

## 2011-10-07 DIAGNOSIS — H6090 Unspecified otitis externa, unspecified ear: Secondary | ICD-10-CM

## 2011-10-07 DIAGNOSIS — H6093 Unspecified otitis externa, bilateral: Secondary | ICD-10-CM

## 2011-10-07 MED ORDER — NEOMYCIN-POLYMYXIN-HC 3.5-10000-1 OT SOLN: 3.0000 [drp] | Freq: Four times a day (QID) | OTIC | Status: AC

## 2011-10-07 NOTE — Progress Notes (Signed)
  Subjective:    Patient ID: Richard Glass, male    DOB: [birth year], 41 y.o.   MRN: [id number]  HPI Pt presents today with R>L ear pain. Pt states that sxs have been present for 2-3 days . Pt has also had some chronic sxs including rhinorrhea and nasal congestion which have been treated with zyrtec and flonase. Pt states that he has been compliant with these medications.  Pt states that he noticed pain while at home. Ear pain worse with palpation. Other associated symptoms include headache, generalized malaise, sore throat, and cough. Mild subjective fevers, though pt did not take temp. No imbalance issues. Pt states that he has had a flu shot this season. Nonsmoker. Pt is unsure of sick contacts.    Review of Systems See HPI, otherwise ROS negative     Objective:   Physical Exam Gen: up in chair, NAD HEENT: , mild canal erythema and tenderness with instrumentation bilaterally R>L, +nasal erythema, rhinorrhea bilaterally, + post oropharyngeal erythema; noted area of swelling and granulation on posterior oropharynx.   CV: RRR, no murmurs auscultated PULM: CTAB, no wheezes, rales, rhoncii ABD: S/NT/+ bowel sounds  EXT: 2+ peripheral pulses   Assessment & Plan:

## 2011-10-07 NOTE — Assessment & Plan Note (Addendum)
Noted in the past in review of pt's chart. Making pictorial documentation of this. Pt denies any history of having this evaluated in the past. Instructed pt to follow up with PCP about this issue in the cominfg weeks. Red flags discuced Overall case discussed with Dr. Deirdre Priest.

## 2011-10-07 NOTE — Assessment & Plan Note (Signed)
Bilateral (R>L) otitis externa in setting of concominant URI/chronic allergic sinusitis. Will treat with cortisporin otic. Instructed pt continue zyrtec and flonase use. Handout given.

## 2011-10-07 NOTE — Patient Instructions (Signed)
It was good to meet you today  I am starting you on antibiotic drops for your ears  Use these as prescribed. I have taken a picture of the tissue in the back of your throat for your medical record.  We will continue to watch this.  If you develop any fevers, chills, or you begin to cough up blood, please give [place] a call.  Be sure to follow up with Dr Sheffield Slider in the next 1-2 weeks, Call with any questions,  God Bless, Shelle Iron MD

## 2011-10-22 ENCOUNTER — Encounter: Payer: Self-pay | Admitting: Family Medicine

## 2011-10-22 ENCOUNTER — Ambulatory Visit (INDEPENDENT_AMBULATORY_CARE_PROVIDER_SITE_OTHER): Payer: Medicare Other | Admitting: Family Medicine

## 2011-10-22 VITALS — BP 123/85 | HR 72 | Ht 68.0 in | Wt 188.0 lb

## 2011-10-22 DIAGNOSIS — R519 Headache, unspecified: Secondary | ICD-10-CM

## 2011-10-22 DIAGNOSIS — H6090 Unspecified otitis externa, unspecified ear: Secondary | ICD-10-CM

## 2011-10-22 DIAGNOSIS — E663 Overweight: Secondary | ICD-10-CM

## 2011-10-22 DIAGNOSIS — G2581 Restless legs syndrome: Secondary | ICD-10-CM

## 2011-10-22 DIAGNOSIS — G894 Chronic pain syndrome: Secondary | ICD-10-CM

## 2011-10-22 DIAGNOSIS — H60399 Other infective otitis externa, unspecified ear: Secondary | ICD-10-CM

## 2011-10-22 DIAGNOSIS — R51 Headache: Secondary | ICD-10-CM

## 2011-10-22 MED ORDER — MICROSPACER MISC: 2.0000 | Status: DC | PRN

## 2011-10-22 NOTE — Progress Notes (Signed)
  Subjective:    Patient ID: Richard Glass, male    DOB: 01-06-1971, 41 y.o.   MRN: [id number]  HPI He has worked recently cutting splitting wood and carrying it. His back pain has been no been no worse than usual. He is tried to take only 2 hydrocodone tablets daily  Follow up with neurology who started Topamax last year and then tizanidine this year for his headaches. He has gained weight with past 2 months.  No recent seizures  He continues to smoke the electric cigarette  His right ear infection is better  He hasn't been having a restless leg symptoms recently   Review of Systems     Objective:   Physical Exam  Constitutional: He is oriented to person, place, and time.  HENT:  Mouth/Throat: No oropharyngeal exudate.       Scarred pharynx lacking tonsils and uvula. Hypertrophic pink lymphoid tissue that has bee present chronically.  Cardiovascular: Normal rate and regular rhythm.   No murmur heard. Pulmonary/Chest: Effort normal and breath sounds normal. He has no wheezes. He has no rales.  Musculoskeletal: Normal range of motion. He exhibits no edema.       Negative sitting root test  Neurological: He is alert and oriented to person, place, and time.  Psychiatric: He has a normal mood and affect.          Assessment & Plan:

## 2011-10-22 NOTE — Patient Instructions (Addendum)
Your weight is up to 188 lbs so you are approaching obesity which increases your risk of diabetes.   Instead of fruit juices, drink non-sugar drinks. Continue your exercise.  Avoid using Q tips in your ears. Let the natural wax build.  Always use your spacer with the inhaler.  Return in 2 months for a weight check with a goal of losing 5 lbs.

## 2011-10-23 NOTE — Assessment & Plan Note (Signed)
He has decreased the Ropinerole to 1/2 pill at bedtime and will try to wean off of it

## 2011-10-23 NOTE — Assessment & Plan Note (Signed)
Currently controlled by Topiramate and Tizanidine. I'm concerned about polypharmacy

## 2011-10-23 NOTE — Assessment & Plan Note (Signed)
improved

## 2011-10-23 NOTE — Assessment & Plan Note (Signed)
He reports having decreased hydrocodone to 2 tabs most days. Further weaning encouraged.

## 2011-10-23 NOTE — Assessment & Plan Note (Signed)
Increasing. Could related to Topiramate appetite stimulation. He will further restrict intake of sugars in drinks.

## 2011-12-02 ENCOUNTER — Other Ambulatory Visit: Payer: Self-pay | Admitting: Family Medicine

## 2011-12-02 DIAGNOSIS — R569 Unspecified convulsions: Secondary | ICD-10-CM

## 2011-12-02 MED ORDER — PHENOBARBITAL 64.8 MG PO TABS: ORAL_TABLET | ORAL | Status: DC

## 2011-12-02 NOTE — Telephone Encounter (Signed)
Written and attached to fax to send to [facility]  

## 2011-12-17 ENCOUNTER — Ambulatory Visit (INDEPENDENT_AMBULATORY_CARE_PROVIDER_SITE_OTHER): Payer: Medicare Other | Admitting: Family Medicine

## 2011-12-17 ENCOUNTER — Encounter: Payer: Self-pay | Admitting: Family Medicine

## 2011-12-17 VITALS — BP 132/75 | HR 80 | Ht 68.0 in | Wt 189.2 lb

## 2011-12-17 DIAGNOSIS — G894 Chronic pain syndrome: Secondary | ICD-10-CM

## 2011-12-17 DIAGNOSIS — G2581 Restless legs syndrome: Secondary | ICD-10-CM

## 2011-12-17 DIAGNOSIS — R51 Headache: Secondary | ICD-10-CM

## 2011-12-17 DIAGNOSIS — IMO0002 Reserved for concepts with insufficient information to code with codable children: Secondary | ICD-10-CM

## 2011-12-17 DIAGNOSIS — M719 Bursopathy, unspecified: Secondary | ICD-10-CM

## 2011-12-17 DIAGNOSIS — M67919 Unspecified disorder of synovium and tendon, unspecified shoulder: Secondary | ICD-10-CM

## 2011-12-17 DIAGNOSIS — R519 Headache, unspecified: Secondary | ICD-10-CM

## 2011-12-17 DIAGNOSIS — K644 Residual hemorrhoidal skin tags: Secondary | ICD-10-CM

## 2011-12-17 DIAGNOSIS — E663 Overweight: Secondary | ICD-10-CM

## 2011-12-17 DIAGNOSIS — Z79899 Other long term (current) drug therapy: Secondary | ICD-10-CM

## 2011-12-17 LAB — POCT GLYCOSYLATED HEMOGLOBIN (HGB A1C): Hemoglobin A1C: 5.1

## 2011-12-17 MED ORDER — HYDROCODONE-ACETAMINOPHEN 5-500 MG PO TABS: 1.0000 | ORAL_TABLET | Freq: Three times a day (TID) | ORAL | Status: DC | PRN

## 2011-12-17 NOTE — Assessment & Plan Note (Signed)
Back pain same. Functioning well on Vicodin

## 2011-12-17 NOTE — Assessment & Plan Note (Signed)
None since stopping Ropinerole

## 2011-12-17 NOTE — Assessment & Plan Note (Signed)
left greater than right recently

## 2011-12-17 NOTE — Assessment & Plan Note (Signed)
Not currently bleeding so not using the cream

## 2011-12-17 NOTE — Assessment & Plan Note (Signed)
Has gained a little

## 2011-12-17 NOTE — Assessment & Plan Note (Signed)
Improved off omeprazole, topiramate and zanaflex

## 2011-12-17 NOTE — Patient Instructions (Addendum)
Please return to see Dr Sheffield Slider in 6 months.  Continue to stay active and work on losing weight. I'll let you know if your A1c sugar test is high.   Please schedule fasting lab tests a couple weeks before that visit.

## 2011-12-17 NOTE — Assessment & Plan Note (Signed)
unchanged

## 2011-12-17 NOTE — Progress Notes (Signed)
  Subjective:    Patient ID: Richard Glass, male    DOB: Jun 28, 1971, 41 y.o.   MRN: [id number]  HPIHe's been helping his friend remodel a house. The dust caused mild asthma for which he used his MDI  Both shoulders hurt  Back pain remains the same  Headaches and diarrhea improved after stopping Tizanidine, Topiramate, Ropinerole, and Omeprazole. No RLS or heartburn.   He denies nocturia, polyuria or polydipsia. Mother has diabetes  Review of Systems     Objective:   Physical Exam  Constitutional: He is oriented to person, place, and time. He appears well-developed and well-nourished.  Cardiovascular: Normal rate and regular rhythm.   No murmur heard. Pulmonary/Chest: Effort normal and breath sounds normal. He has no wheezes.  Musculoskeletal: Normal range of motion. He exhibits no edema and no tenderness.       Full range of motion of shoulders without apparent pain  Bends forward without apparent pain to within 8 inches of touching toes.   Neurological: He is alert and oriented to person, place, and time.  Psychiatric: He has a normal mood and affect. His behavior is normal. Thought content normal.          Assessment & Plan:

## 2012-01-25 ENCOUNTER — Other Ambulatory Visit: Payer: Self-pay | Admitting: Family Medicine

## 2012-02-12 ENCOUNTER — Other Ambulatory Visit: Payer: Self-pay | Admitting: Family Medicine

## 2012-02-12 DIAGNOSIS — G894 Chronic pain syndrome: Secondary | ICD-10-CM

## 2012-02-12 MED ORDER — ALPRAZOLAM 0.5 MG PO TABS: 0.5000 mg | ORAL_TABLET | Freq: Two times a day (BID) | ORAL | Status: DC | PRN

## 2012-02-24 ENCOUNTER — Other Ambulatory Visit: Payer: Self-pay | Admitting: Family Medicine

## 2012-02-24 MED ORDER — IPRATROPIUM-ALBUTEROL 20-100 MCG/ACT IN AERS
2.0000 | INHALATION_SPRAY | Freq: Four times a day (QID) | RESPIRATORY_TRACT | Status: DC
Start: 2012-02-24 — End: 2013-01-27

## 2012-02-24 NOTE — Telephone Encounter (Signed)
Combivent order changed per fax from [facility] [address] stating that he has been changed to Combivent Respimat

## 2012-03-15 ENCOUNTER — Encounter (HOSPITAL_COMMUNITY): Payer: Self-pay | Admitting: Emergency Medicine

## 2012-03-15 ENCOUNTER — Emergency Department (HOSPITAL_COMMUNITY)
Admission: EM | Admit: 2012-03-15 | Discharge: 2012-03-15 | Disposition: A | Discharge status: Home / Self Care | Payer: Medicare Other | Source: Home / Self Care

## 2012-03-15 DIAGNOSIS — H5712 Ocular pain, left eye: Secondary | ICD-10-CM

## 2012-03-15 DIAGNOSIS — H1030 Unspecified acute conjunctivitis, unspecified eye: Secondary | ICD-10-CM

## 2012-03-15 DIAGNOSIS — H5710 Ocular pain, unspecified eye: Secondary | ICD-10-CM

## 2012-03-15 HISTORY — DX: Unspecified asthma, uncomplicated: J45.909

## 2012-03-15 HISTORY — DX: Anxiety disorder, unspecified: F41.9

## 2012-03-15 HISTORY — DX: Unspecified convulsions: R56.9

## 2012-03-15 MED ORDER — POLYMYXIN B-TRIMETHOPRIM 10000-0.1 UNIT/ML-% OP SOLN: 1.0000 [drp] | OPHTHALMIC | Status: AC

## 2012-03-15 MED ORDER — TETRACAINE HCL 0.5 % OP SOLN
OPHTHALMIC | Status: AC
  Filled 2012-03-15: qty 2

## 2012-03-15 MED ORDER — TETRACAINE HCL 0.5 % OP SOLN
1.0000 [drp] | Freq: Once | OPHTHALMIC | Status: AC
  Administered 2012-03-15: 1 [drp] via OPHTHALMIC

## 2012-03-15 NOTE — ED Notes (Signed)
Onset 5 days ago, initilly felt like something in eye, stinging, used visine and "eye wash".  Now eye is itching and burning.  Left eye vision "kinda blurry".  Patient does not wear protective lenses

## 2012-03-15 NOTE — ED Provider Notes (Signed)
History     CSN: [id number]  Arrival date & time 03/15/12  1108   None     Chief Complaint  Patient presents with  . Eye Pain    (Consider location/radiation/quality/duration/timing/severity/associated sxs/prior treatment) Patient is a 41 y.o. male presenting with eye pain. The history is provided by the patient.  Eye Pain   41 y.o. male with burning, redness, and tearing to left eye for the last five days.  No other symptoms.  No significant prior ophthalmological history. Reports blurred vision in left eye, no known foreign bodies, reports working in the yard over the last week.  No improvement in sensation with home OTC visine use.  Denies photophobia, no severe eye pain.  Past Medical History  Diagnosis Date  . Traumatic cerebral hemorrhage   . 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  . Seizures   . Asthma   . Anxiety     Past Surgical History  Procedure Date  . Shoulder surgery   . Brain surgery     No family history on file.  History  Substance Use Topics  . Smoking status: Former Smoker -- 1.0 packs/day for 22 years    Types: Cigarettes    Quit date: 02/20/2010  . Smokeless tobacco: Current User    Types: Snuff  . Alcohol Use: No     none since 7/11      Review of Systems  Eyes: Positive for pain.  All other systems reviewed and are negative.    Allergies  Review of patient's allergies indicates no known allergies.  Home Medications   Current Outpatient Rx  Name Route Sig Dispense Refill  . ALPRAZOLAM 0.5 MG PO TABS Oral Take 1 tablet (0.5 mg total) by mouth 2 (two) times daily as needed for sleep. Take one tablet two times a day. 60 tablet 5  . CETIRIZINE HCL 10 MG PO TABS Oral Take 10 mg by mouth daily.      Marland Kitchen VITAMIN D 1000 UNITS PO TABS Oral Take 1,000 Units by mouth daily.      Marland Kitchen FLUTICASONE PROPIONATE 50 MCG/ACT NA SUSP  ONE SPRAY EACH NOSTRIL DAILY 16 g 5  .  HYDROCODONE-ACETAMINOPHEN 5-500 MG PO TABS Oral Take 1 tablet by mouth 3 (three) times daily as needed for pain. three (3) times a day as needed. 90 tablet 5  . HYDROCORTISONE 2.5 % RE CREA  Apply rectally 2 times daily 30 g 1  . IPRATROPIUM-ALBUTEROL 20-100 MCG/ACT IN AERS Inhalation Inhale 2 puffs into the lungs every 6 (six) hours. 1 Inhaler 11  . OMEPRAZOLE 20 MG PO CPDR  TAKE 1 CAPSULE EVERY DAY 30 capsule 0  . PHENOBARBITAL 64.8 MG PO TABS  Take 3 tablets at bedtime. 90 tablet 5  . ROPINIROLE HCL 2 MG PO TABS  TAKE 1 TABLET BY MOUTH EVERY DAY AT BEDTIME 30 tablet 11  . SIMVASTATIN 40 MG PO TABS  TAKE 1 TABLET AT BEDTIME 31 tablet 10  . MICROSPACER MISC Inhalation Inhale 2 puffs into the lungs every 4 (four) hours as needed. For use with MDI 1 each 2  . POLYMYXIN B-TRIMETHOPRIM 10000-0.1 UNIT/ML-% OP SOLN Left Eye Place 1 drop into the left eye every 4 (four) hours. 10 mL 0    BP 125/76  Pulse 77  Temp 97.9 F (36.6 C) (Oral)  Resp 18  SpO2 99%  Physical Exam  Nursing note and vitals reviewed. Constitutional:  He is oriented to person, place, and time. Vital signs are normal. He appears well-developed and well-nourished. He is active and cooperative.  HENT:  Head: Normocephalic.  Right Ear: Hearing, tympanic membrane, external ear and ear canal normal.  Left Ear: Hearing, tympanic membrane, external ear and ear canal normal.  Nose: Nose normal.  Mouth/Throat: Uvula is midline, oropharynx is clear and moist and mucous membranes are normal.  Eyes: EOM are normal. Pupils are equal, round, and reactive to light. No foreign bodies found. Left eye exhibits no discharge and no exudate. No foreign body present in the left eye. Left conjunctiva is injected. No scleral icterus.       Left conjunctival irritation, reports some improvement after sweeping with cotton swab  Neck: Trachea normal. Neck supple.  Cardiovascular: Normal rate, regular rhythm and normal heart sounds.   Pulmonary/Chest:  Effort normal and breath sounds normal.  Lymphadenopathy:    He has cervical adenopathy.  Neurological: He is alert and oriented to person, place, and time. No cranial nerve deficit or sensory deficit.  Skin: Skin is warm and dry.  Psychiatric: He has a normal mood and affect. His speech is normal and behavior is normal. Judgment and thought content normal. Cognition and memory are normal.    ED Course  Procedures (including critical care time)  Labs Reviewed - No data to display No results found.   1. Left eye pain   2. Conjunctivitis, acute       MDM  Antibiotic eye drops as ordered, follow up if symptoms not improved in 2-3 days with opthalmologist.        Johnsie Kindred, NP 03/15/12 1239

## 2012-03-15 NOTE — ED Provider Notes (Signed)
Medical screening examination/treatment/procedure(s) were performed by non-physician practitioner and as supervising physician I was immediately available for consultation/collaboration.  Leslee Home, M.D.   Reuben Likes, MD 03/15/12 [phone]

## 2012-03-22 ENCOUNTER — Other Ambulatory Visit: Payer: Self-pay | Admitting: Family Medicine

## 2012-03-30 ENCOUNTER — Emergency Department (INDEPENDENT_AMBULATORY_CARE_PROVIDER_SITE_OTHER): Payer: Medicare Other

## 2012-03-30 ENCOUNTER — Emergency Department (HOSPITAL_COMMUNITY)
Admission: EM | Admit: 2012-03-30 | Discharge: 2012-03-30 | Disposition: A | Discharge status: Home / Self Care | Payer: Medicare Other | Source: Home / Self Care | Attending: Emergency Medicine | Admitting: Emergency Medicine

## 2012-03-30 ENCOUNTER — Encounter (HOSPITAL_COMMUNITY): Payer: Self-pay

## 2012-03-30 DIAGNOSIS — S3210XA Unspecified fracture of sacrum, initial encounter for closed fracture: Secondary | ICD-10-CM

## 2012-03-30 DIAGNOSIS — S322XXA Fracture of coccyx, initial encounter for closed fracture: Secondary | ICD-10-CM

## 2012-03-30 MED ORDER — DICLOFENAC SODIUM 75 MG PO TBEC: 75.0000 mg | DELAYED_RELEASE_TABLET | Freq: Two times a day (BID) | ORAL | Status: DC

## 2012-03-30 MED ORDER — OXYCODONE-ACETAMINOPHEN 5-325 MG PO TABS: ORAL_TABLET | ORAL | Status: AC

## 2012-03-30 MED ORDER — KETOROLAC TROMETHAMINE 60 MG/2ML IM SOLN
60.0000 mg | Freq: Once | INTRAMUSCULAR | Status: AC
  Administered 2012-03-30: 60 mg via INTRAMUSCULAR

## 2012-03-30 MED ORDER — KETOROLAC TROMETHAMINE 60 MG/2ML IM SOLN
INTRAMUSCULAR | Status: AC
  Filled 2012-03-30: qty 2

## 2012-03-30 NOTE — ED Notes (Signed)
States 2 weeks ago, he slipped and fell, struck his left hip on a tree root, and still has pain in hip w pain radiating into his leg

## 2012-03-30 NOTE — ED Provider Notes (Signed)
Chief Complaint  Patient presents with  . Fall    History of Present Illness:   The patient is a 41 year old male who slipped on some wet mud 2 weeks ago, landing on his buttocks. Ever since then he's had pain and a sensation of swelling in the area of the coccyx. It hurts him to sit. He does have some pain radiating down his left leg and slight numbness and tingling but no weakness. No bladder or bowel complaints. No pain on bending.  Review of Systems:  Other than noted above, the patient denies any of the following symptoms: Systemic:  No fever, chills, fatigue, or weight loss. GI:  No abdominal pain, nausea, vomiting, diarrhea, constipation or blood in stool. GU:  No dysuria, frequency, urgency, or hematuria. No incontinence or difficulty urinating.  M-S:  No neck pain, joint pain, arthritis, or myalgias. Neuro:  No parethesias or muscular weakness. Skin:  No rash or itching.    family history, social history, meds, and allergies were reviewed.  Physical Exam:   Vital signs:  BP 136/87  Pulse 75  Temp 98.7 F (37.1 C) (Oral)  Resp 16  SpO2 100% General:  Alert, oriented, in no distress. Abdomen:  Soft, non-tender.  No organomegaly or mass.  No pulsatile midline abdominal mass or bruit. Back:  There is pain to palpation over the coccyx area but no bruising, swelling, or deformity. There is no pain to palpation of the sacrum, pelvis, lumbar spine or the paravertebral muscles. His lumbar spine has a very good range of motion with slight pain on forward bending, referred to the coccyx area. Straight leg raising is negative. Neuro:  Normal muscle strength, sensations and DTRs. Extremities: Pedal pulses were full, there was no edema. Skin:  Clear, warm and dry.  No rash.  Radiology:  Dg Sacrum/coccyx  03/30/2012  *RADIOLOGY REPORT*  Clinical Data: Sacrum and coccyx pain secondary to a fall 2 weeks ago.  SACRUM AND COCCYX - 2+ VIEW  Comparison: CT scan dated 06/20/2009  Findings: There  is no fracture, subluxation, or other significant abnormality of the sacrum or coccyx.  IMPRESSION: Normal exam.  Original Report Authenticated By: Gwynn Burly, M.D.    Assessment:  The encounter diagnosis was Fractured coccyx. Even though the x-ray was negative, I think he fractured his coccyx. This will take about 6 weeks to heal up which would be another month. I suggested he followup with Dr. Ranell Patrick if no better at the end of that time. In the meantime he should soak in a tub of hot water twice a day and use a foam rubber or implantable.or memory foam pillow.  Plan:   1.  The following meds were prescribed:   New Prescriptions   DICLOFENAC (VOLTAREN) 75 MG EC TABLET    Take 1 tablet (75 mg total) by mouth 2 (two) times daily.   OXYCODONE-ACETAMINOPHEN (PERCOCET) 5-325 MG PER TABLET    1 to 2 tablets every 6 hours as needed for pain.   2.  The patient was instructed in symptomatic care and handouts were given. 3.  The patient was told to return if becoming worse in any way, if no better in 2 weeks, and given some red flag symptoms that would indicate earlier return. 4.  The patient was encouraged to try to be as active as possible and given some exercises to do followed by moist heat.    Reuben Likes, MD 03/30/12 [phone]

## 2012-04-26 ENCOUNTER — Other Ambulatory Visit: Payer: Self-pay | Admitting: Family Medicine

## 2012-05-04 ENCOUNTER — Encounter (HOSPITAL_COMMUNITY): Payer: Self-pay | Admitting: Emergency Medicine

## 2012-05-04 ENCOUNTER — Emergency Department (HOSPITAL_COMMUNITY)
Admission: EM | Admit: 2012-05-04 | Discharge: 2012-05-04 | Disposition: A | Discharge status: Home / Self Care | Payer: Medicare Other | Source: Home / Self Care

## 2012-05-04 DIAGNOSIS — T148XXA Other injury of unspecified body region, initial encounter: Secondary | ICD-10-CM

## 2012-05-04 DIAGNOSIS — W57XXXA Bitten or stung by nonvenomous insect and other nonvenomous arthropods, initial encounter: Secondary | ICD-10-CM

## 2012-05-04 DIAGNOSIS — S322XXA Fracture of coccyx, initial encounter for closed fracture: Secondary | ICD-10-CM

## 2012-05-04 DIAGNOSIS — S3210XA Unspecified fracture of sacrum, initial encounter for closed fracture: Secondary | ICD-10-CM

## 2012-05-04 DIAGNOSIS — S30860A Insect bite (nonvenomous) of lower back and pelvis, initial encounter: Secondary | ICD-10-CM

## 2012-05-04 MED ORDER — DOXYCYCLINE HYCLATE 100 MG PO TABS: 100.0000 mg | ORAL_TABLET | Freq: Two times a day (BID) | ORAL | Status: AC

## 2012-05-04 NOTE — ED Notes (Signed)
Large tick on pt's back with redness around the site.

## 2012-05-04 NOTE — ED Provider Notes (Signed)
History     CSN: [id number]  Arrival date & time 05/04/12  1704   First MD Initiated Contact with Patient 05/04/12 1705      Chief Complaint  Patient presents with  . Tick Removal   HPI 41 year old male presents today to urgent care center with a tick bite. He is unsure as to how long he has had this and started having some itching and pain yesterday.  He asked his mother-in-law to examine it she noted a tick on his left upper back and hence he came here to the urgent care center for further evaluation treatment He states he has some mild nausea but has no headache no shortness of breath no chest pain no nausea at present no vomiting. Patient has a surrounding area of erythema on visual examination of about 2 cm.   Past Medical History  Diagnosis Date  . Traumatic cerebral hemorrhage   . 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  . Seizures   . Asthma   . Anxiety     Past Surgical History  Procedure Date  . Shoulder surgery   . Brain surgery     No family history on file.  History  Substance Use Topics  . Smoking status: Former Smoker -- 1.0 packs/day for 22 years    Types: Cigarettes    Quit date: 02/20/2010  . Smokeless tobacco: Current User    Types: Snuff  . Alcohol Use: No     none since 7/11      Review of Systems Please see history of present illness-no dysuria no hematuria no fever no chills no cough no cold Patient does have pain in the area of the swelling and bite and tick was removed and discarded  Allergies  Review of patient's allergies indicates no known allergies.  Home Medications   Current Outpatient Rx  Name Route Sig Dispense Refill  . ALPRAZOLAM 0.5 MG PO TABS Oral Take 1 tablet (0.5 mg total) by mouth 2 (two) times daily as needed for sleep. Take one tablet two times a day. 60 tablet 5  . CETIRIZINE HCL 10 MG PO TABS Oral Take 10 mg by mouth daily.      Marland Kitchen VITAMIN D  1000 UNITS PO TABS Oral Take 1,000 Units by mouth daily.      Marland Kitchen DICLOFENAC SODIUM 75 MG PO TBEC Oral Take 1 tablet (75 mg total) by mouth 2 (two) times daily. 20 tablet 0  . FLUTICASONE PROPIONATE 50 MCG/ACT NA SUSP  USE 1 SPRAY IN EACH NOSTRIL DAILY 16 g 5  . FLUTICASONE PROPIONATE 50 MCG/ACT NA SUSP  USE 1 SPRAY IN EACH NOSTRIL DAILY 16 g 11  . HYDROCODONE-ACETAMINOPHEN 5-500 MG PO TABS Oral Take 1 tablet by mouth 3 (three) times daily as needed for pain. three (3) times a day as needed. 90 tablet 5  . HYDROCORTISONE 2.5 % RE CREA  Apply rectally 2 times daily 30 g 1  . IPRATROPIUM-ALBUTEROL 20-100 MCG/ACT IN AERS Inhalation Inhale 2 puffs into the lungs every 6 (six) hours. 1 Inhaler 11  . OMEPRAZOLE 20 MG PO CPDR  TAKE 1 CAPSULE EVERY DAY 30 capsule 0  . PHENOBARBITAL 64.8 MG PO TABS  Take 3 tablets at bedtime. 90 tablet 5  . ROPINIROLE HCL 2 MG PO TABS  TAKE 1 TABLET BY MOUTH EVERY DAY AT BEDTIME 30 tablet 11  . SIMVASTATIN 40 MG PO TABS  TAKE  1 TABLET AT BEDTIME 31 tablet 10  . MICROSPACER MISC Inhalation Inhale 2 puffs into the lungs every 4 (four) hours as needed. For use with MDI 1 each 2    BP 145/92  Pulse 84  Temp 98.1 F (36.7 C) (Oral)  Resp 18  SpO2 96%  Physical Exam Pleasant alert oriented Caucasian in no apparent distress No pallor no icterus Throat clear Chest clinically clear, no TVF/TVR Abdomen soft nontender nondistended no rebound Skin-there is engorged tick on his right upper back with surrounding 2 cm erythema.   ED Course  Procedures (including critical care time)  Labs Reviewed - No data to display No results found.   No diagnosis found.    MDM  41 year old male with likely.tick bite Patient is unsure as to when he was bitten and as it is about 72 hours potentially there is some minimal risk of disease transmission.-The likelihood of developing [facility] [place] spotted fever is very small (1%) As patient however is having nausea and some  nonspecific symptoms, I will go ahead and treat the patient with doxycycline 100 mg twice a day for total of 7 days and patient will need to followup with his primary care physician in family medicine in the next 5-7 days.  Pleas Koch, MD [facility] [phone]         Rhetta Mura, MD 05/04/12 [phone]

## 2012-05-12 ENCOUNTER — Encounter: Payer: Self-pay | Admitting: Family Medicine

## 2012-05-12 ENCOUNTER — Ambulatory Visit (INDEPENDENT_AMBULATORY_CARE_PROVIDER_SITE_OTHER): Payer: Medicare Other | Admitting: Family Medicine

## 2012-05-12 VITALS — BP 123/71 | HR 65 | Temp 98.0°F | Ht 68.0 in | Wt 188.0 lb

## 2012-05-12 DIAGNOSIS — S30860A Insect bite (nonvenomous) of lower back and pelvis, initial encounter: Secondary | ICD-10-CM

## 2012-05-12 DIAGNOSIS — T148XXA Other injury of unspecified body region, initial encounter: Secondary | ICD-10-CM

## 2012-05-12 DIAGNOSIS — W57XXXA Bitten or stung by nonvenomous insect and other nonvenomous arthropods, initial encounter: Secondary | ICD-10-CM

## 2012-05-12 NOTE — Patient Instructions (Addendum)
It was nice to meet you today.  It appears as though you are recovering from your tick bite as we expected.  You have already taken a 7 day course of antibiotics   Return to the clinic if you start develop a fever, new headaches, new muscle/joint aches, changes in your vision.

## 2012-05-12 NOTE — Progress Notes (Signed)
Subjective:     Patient ID: Richard Glass, male   DOB: 27-Oct-1970, 41 y.o.   MRN: [id number]  HPI 42 yo male presents to clinic today for f/u of recent tick bite.  Pt was seen at urgent care center 05/04/2012 after noticing a spot on the left side of his back was itching.  He also reported some mild nausea initially.  There is no record of identification of the type of tick.   He was treated prophylactically with a 7 day course of doxycycline which was finished yesterday.  Since being seen at urgent care, pt reports no nausea and improved itching of the tick bite site.  Pt reports rash on forearms and hands, mostly likely a contact dermatitis.  No other rashes reported.  Denies fever, cold sweats, HA, N/V, myalgias, and arthralgias  Estimated time of tick attachment = ~1.5-2 days  Attending Note: Patient seen and examined by me in conjunction with medical student Billey Gosling, (MS3 [facility]), and I agree with his documentation with following additions in bold type.  Briefly, patient with recent tick exposure, found on back by family member after he noted a burning sensation along L infrascapular region.  Tick was noted to be there.  He had been in yard about 2 days prior to discovery of tick.  Seen in ED, tick removed.  He had experienced nausea without emesis before presenting to ED>  Treated with course of doxycycline, which he completed yesterday (took full 7-day course). Denies fevers or chills, no eruption of rashes, no further nausea or vomiting, no diarrhea. JB  Review of Systems Denies chils, SOB, difficulty breathing, cough, chest pain, dysuria no hematuria, changes in vision or hearing, diarrhea, constipation, blood steal urine, blood in stool.    Objective:   Physical Exam General: well appearing, in no acute distress HEENT: TM nml w/good cone of light, pupils equal and reactive, EOM intact, no mouth ulcers, nml appearing pharynx, no LAD Lungs: clear to ascultation bilaterally, nml work of  breathing, no wheezing, no stridor Heart: RRR, no murmurs Abdomen: nt/nd, no masses, bowel sounds present MSK: mild TTP ~3 inch radius around site of previous tick bite Skin: 1.5 cm diameter erythematous spot on left back (previously noted to be ~2cm), no central clearing or EM, small linear rashes on forearms and hands  EXAM: Well appearing, no apparent distress HEENT Neck supple. No cervical adenopathy.  SKIN: 1-1.5cm2 area of erythema, no open areas; appears consistent with granulation tissue. No other lesions or areas of erythema on back, abdomen, trunk, arms, legs, neck or face/scalp. PULM Clear bilaterally; no rales or wheezes.  COR regular S1S2, no extra sounds.  JB     Assessment:     41 yo male presents to clinic today with recent tick bite and 7 day course of doxycycline with improved symptoms.  Little concern at this point for Lyme disease or RMSF.    Plan:     Tick bite: -recovering well -no more treatment is needed at this point -return to clinic if new onset of fever, myalgias/arthralgias, changes in vision, dizziness, difficulty breathing    I agree with assessment/plan. No further treatment needed.  Discussed s/sx to monitor, reasons for return.  See Problem LIst overview for further documentation. JB

## 2012-05-13 DIAGNOSIS — S20369A Insect bite (nonvenomous) of unspecified front wall of thorax, initial encounter: Secondary | ICD-10-CM | POA: Insufficient documentation

## 2012-05-19 ENCOUNTER — Other Ambulatory Visit: Payer: Self-pay | Admitting: Family Medicine

## 2012-05-19 ENCOUNTER — Other Ambulatory Visit: Payer: Medicare Other

## 2012-05-19 DIAGNOSIS — E785 Hyperlipidemia, unspecified: Secondary | ICD-10-CM

## 2012-05-19 DIAGNOSIS — R569 Unspecified convulsions: Secondary | ICD-10-CM

## 2012-05-19 LAB — COMPREHENSIVE METABOLIC PANEL
ALT: 12 U/L (ref 0–53)
AST: 13 U/L (ref 0–37)
Albumin: 4.5 g/dL (ref 3.5–5.2)
Alkaline Phosphatase: 77 U/L (ref 39–117)
BUN: 6 mg/dL (ref 6–23)
CO2: 26 mEq/L (ref 19–32)
Calcium: 9.4 mg/dL (ref 8.4–10.5)
Chloride: 106 mEq/L (ref 96–112)
Creat: 0.73 mg/dL (ref 0.50–1.35)
Glucose, Bld: 86 mg/dL (ref 70–99)
Potassium: 4.3 mEq/L (ref 3.5–5.3)
Sodium: 139 mEq/L (ref 135–145)
Total Bilirubin: 0.4 mg/dL (ref 0.3–1.2)
Total Protein: 6.5 g/dL (ref 6.0–8.3)

## 2012-05-19 LAB — CBC
HCT: 44.9 % (ref 39.0–52.0)
Hemoglobin: 15.8 g/dL (ref 13.0–17.0)
MCH: 31.8 pg (ref 26.0–34.0)
MCHC: 35.2 g/dL (ref 30.0–36.0)
MCV: 90.3 fL (ref 78.0–100.0)
Platelets: 286 10*3/uL (ref 150–400)
RBC: 4.97 MIL/uL (ref 4.22–5.81)
RDW: 13.2 % (ref 11.5–15.5)
WBC: 11.1 10*3/uL — ABNORMAL HIGH (ref 4.0–10.5)

## 2012-05-19 LAB — LIPID PANEL
Cholesterol: 149 mg/dL (ref 0–200)
HDL: 33 mg/dL — ABNORMAL LOW (ref >39)
LDL Cholesterol: 82 mg/dL (ref 0–99)
Total CHOL/HDL Ratio: 4.5 Ratio
Triglycerides: 168 mg/dL — ABNORMAL HIGH (ref <150)
VLDL: 34 mg/dL (ref 0–40)

## 2012-05-19 MED ORDER — PHENOBARBITAL 64.8 MG PO TABS: ORAL_TABLET | ORAL | Status: DC

## 2012-05-19 NOTE — Progress Notes (Signed)
CMP,CBC AND FLP DONE TODAY Hafsah Hendler 

## 2012-06-02 ENCOUNTER — Encounter: Payer: Self-pay | Admitting: Family Medicine

## 2012-06-02 ENCOUNTER — Ambulatory Visit (INDEPENDENT_AMBULATORY_CARE_PROVIDER_SITE_OTHER): Payer: Medicare Other | Admitting: Family Medicine

## 2012-06-02 VITALS — BP 129/79 | HR 72 | Temp 98.2°F | Ht 69.0 in | Wt 188.8 lb

## 2012-06-02 DIAGNOSIS — T148XXA Other injury of unspecified body region, initial encounter: Secondary | ICD-10-CM

## 2012-06-02 DIAGNOSIS — M67919 Unspecified disorder of synovium and tendon, unspecified shoulder: Secondary | ICD-10-CM

## 2012-06-02 DIAGNOSIS — IMO0002 Reserved for concepts with insufficient information to code with codable children: Secondary | ICD-10-CM

## 2012-06-02 DIAGNOSIS — S30860A Insect bite (nonvenomous) of lower back and pelvis, initial encounter: Secondary | ICD-10-CM

## 2012-06-02 DIAGNOSIS — G894 Chronic pain syndrome: Secondary | ICD-10-CM

## 2012-06-02 DIAGNOSIS — M719 Bursopathy, unspecified: Secondary | ICD-10-CM

## 2012-06-02 DIAGNOSIS — R569 Unspecified convulsions: Secondary | ICD-10-CM

## 2012-06-02 MED ORDER — HYDROCODONE-ACETAMINOPHEN 5-500 MG PO TABS: 1.0000 | ORAL_TABLET | Freq: Three times a day (TID) | ORAL | Status: DC | PRN

## 2012-06-02 MED ORDER — PHENOBARBITAL 64.8 MG PO TABS: ORAL_TABLET | ORAL | Status: DC

## 2012-06-02 MED ORDER — ALPRAZOLAM 0.5 MG PO TABS: 0.5000 mg | ORAL_TABLET | Freq: Two times a day (BID) | ORAL | Status: DC | PRN

## 2012-06-02 NOTE — Assessment & Plan Note (Signed)
Back to baseline chronic pain after fall injury

## 2012-06-02 NOTE — Progress Notes (Signed)
  Subjective:    Patient ID: Richard Glass, male    DOB: 1970-11-07, 41 y.o.   MRN: [id number]  HPI Tick bite - was treated at [facility] with Doxycycline. Never got a rash, but still feels a bump at the site on his left lower back  Sacral pain - was treated for this at [facility] and it has improved but has his chronic need for hydrocodone 3-4 times daily for his low back pain. 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   left shoulder - recently has been painful like his left one had been before his surgery. Painful to internally and externally rotate and at night. No specific injury.   Leg discomfort - improved and no longer taking Requip  Seizures - none recently.   Tremors - improved   Review of Systems     Objective:   Physical Exam  Abdominal: Soft. There is no tenderness.  Musculoskeletal: He exhibits no edema.       Full range of motion left shoulder, but evident pain and tremor on internal and external rotation and abduction. Positive impingement sign, Neg drop test Tender subacromial space  He bends forward at the waist without apparent pain and reverses his lumbar curve normally   Skin:       3mm nodule without erythema, left back at waistline.          Assessment & Plan:

## 2012-06-02 NOTE — Assessment & Plan Note (Signed)
well controlled  

## 2012-06-02 NOTE — Patient Instructions (Signed)
Please return to see Dr Sheffield Slider in 3months. Please return to see Dr Sheffield Slider in 1 month if your shoulder hasn't improved  Shoulder Pain The shoulder is a ball and socket joint. The muscles and tendons (rotator cuff) are what keep the shoulder in its joint and stable. This collection of muscles and tendons holds in the head (ball) of the humerus (upper arm bone) in the fossa (cup) of the scapula (shoulder blade). Today no reason was found for your shoulder pain. Often pain in the shoulder may be treated conservatively with temporary immobilization. For example, holding the shoulder in one place using a sling for rest. Physical therapy may be needed if problems continue. HOME CARE INSTRUCTIONS   Apply ice to the sore area for 15 to 20 minutes, 3 to 4 times per day for the first 2 days. Put the ice in a plastic bag. Place a towel between the bag of ice and your skin.   If you have or were given a shoulder sling and straps, do not remove for as long as directed by your caregiver or until you see a caregiver for a follow-up examination. If you need to remove it to shower or bathe, move your arm as little as possible.   Sleep on several pillows at night to lessen swelling and pain.   Only take over-the-counter or prescription medicines for pain, discomfort, or fever as directed by your caregiver.   Keep any follow-up appointments in order to avoid any type of permanent shoulder disability or chronic pain problems.  SEEK MEDICAL CARE IF:   Pain in your shoulder increases or new pain develops in your arm, hand, or fingers.   Your hand or fingers are colder than your other hand.   You do not obtain pain relief with the medications or your pain becomes worse.  SEEK IMMEDIATE MEDICAL CARE IF:   Your arm, hand, or fingers are numb or tingling.   Your arm, hand, or fingers are swollen, painful, or turn white or blue.   You develop chest pain or shortness of breath.  MAKE SURE YOU:   Understand these  instructions.   Will watch your condition.   Will get help right away if you are not doing well or get worse.  Document Released: 06/12/2005 Document Revised: 08/22/2011 Document Reviewed: 08/17/2011 [facility] Patient Information 2012 [place], [place]. I'll let you know if your phenobarbital level needs adjusting

## 2012-06-02 NOTE — Assessment & Plan Note (Signed)
Worse now on left. After obtaining consent and doing time out his left shoulder was injected from a posterior approach using 3 cc Triamcinolone 10 mg/cc and 5 cc of Xylocaine.  Warned that he may have some worsening this evening.

## 2012-06-02 NOTE — Assessment & Plan Note (Signed)
Small nodule at site of bite, but no other residual

## 2012-06-03 ENCOUNTER — Encounter: Payer: Self-pay | Admitting: Family Medicine

## 2012-06-03 LAB — PHENOBARBITAL LEVEL: Phenobarbital: 23.5 ug/mL (ref 15.0–40.0)

## 2012-06-05 ENCOUNTER — Encounter: Payer: Self-pay | Admitting: Family Medicine

## 2012-07-27 ENCOUNTER — Other Ambulatory Visit: Payer: Self-pay | Admitting: Family Medicine

## 2012-09-06 ENCOUNTER — Encounter (HOSPITAL_COMMUNITY): Payer: Self-pay | Admitting: *Deleted

## 2012-09-06 ENCOUNTER — Emergency Department (INDEPENDENT_AMBULATORY_CARE_PROVIDER_SITE_OTHER): Payer: Medicare Other

## 2012-09-06 ENCOUNTER — Emergency Department (HOSPITAL_COMMUNITY)
Admission: EM | Admit: 2012-09-06 | Discharge: 2012-09-06 | Disposition: A | Discharge status: Home / Self Care | Payer: Medicare Other | Source: Home / Self Care | Attending: Emergency Medicine | Admitting: Emergency Medicine

## 2012-09-06 DIAGNOSIS — M25569 Pain in unspecified knee: Secondary | ICD-10-CM

## 2012-09-06 DIAGNOSIS — M25561 Pain in right knee: Secondary | ICD-10-CM

## 2012-09-06 MED ORDER — KETOROLAC TROMETHAMINE 60 MG/2ML IM SOLN
INTRAMUSCULAR | Status: AC
  Filled 2012-09-06: qty 2

## 2012-09-06 MED ORDER — TRAMADOL HCL 50 MG PO TABS: 50.0000 mg | ORAL_TABLET | Freq: Four times a day (QID) | ORAL | Status: DC | PRN

## 2012-09-06 MED ORDER — MELOXICAM 7.5 MG PO TABS: 7.5000 mg | ORAL_TABLET | Freq: Every day | ORAL | Status: DC

## 2012-09-06 MED ORDER — KETOROLAC TROMETHAMINE 60 MG/2ML IM SOLN
60.0000 mg | Freq: Once | INTRAMUSCULAR | Status: AC
  Administered 2012-09-06: 60 mg via INTRAMUSCULAR

## 2012-09-06 NOTE — ED Provider Notes (Signed)
History     CSN: [id number]  Arrival date & time 09/06/12  1554   First MD Initiated Contact with Patient 09/06/12 1740      Chief Complaint  Patient presents with  . Leg Pain    (Consider location/radiation/quality/duration/timing/severity/associated sxs/prior treatment) Patient is a 41 y.o. male presenting with knee pain. The history is provided by the patient.  Knee Pain This is a new problem. The current episode started more than 1 week ago. The problem occurs daily. The problem has not changed since onset.The symptoms are aggravated by bending, twisting, walking and standing. The symptoms are relieved by rest.  Pt reports pain started about one week ago after a fall in his garage.  Past Medical History  Diagnosis Date  . Traumatic cerebral hemorrhage   . 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  . Seizures   . Asthma   . Anxiety     Past Surgical History  Procedure Date  . Shoulder surgery   . Brain surgery     No family history on file.  History  Substance Use Topics  . Smoking status: Heavy Tobacco Smoker -- 1.0 packs/day for 22 years    Types: Cigarettes    Last Attempt to Quit: 02/20/2010  . Smokeless tobacco: Current User    Types: Snuff  . Alcohol Use: No     Comment: none since 7/11      Review of Systems  Musculoskeletal: Positive for arthralgias.  All other systems reviewed and are negative.    Allergies  Review of patient's allergies indicates no known allergies.  Home Medications   Current Outpatient Rx  Name  Route  Sig  Dispense  Refill  . ALPRAZOLAM 0.5 MG PO TABS   Oral   Take 1 tablet (0.5 mg total) by mouth 2 (two) times daily as needed for sleep. Take one tablet two times a day.   60 tablet   5   . CETIRIZINE HCL 10 MG PO TABS   Oral   Take 10 mg by mouth daily.           Marland Kitchen VITAMIN D 1000 UNITS PO TABS   Oral   Take 1,000 Units by mouth daily.          Marland Kitchen DICLOFENAC SODIUM 75 MG PO TBEC   Oral   Take 1 tablet (75 mg total) by mouth 2 (two) times daily.   20 tablet   0   . FLUTICASONE PROPIONATE 50 MCG/ACT NA SUSP      USE 1 SPRAY IN EACH NOSTRIL DAILY   16 g   11   . HYDROCODONE-ACETAMINOPHEN 5-500 MG PO TABS   Oral   Take 1 tablet by mouth 3 (three) times daily as needed for pain. three (3) times a day as needed.   90 tablet   5   . IPRATROPIUM-ALBUTEROL 20-100 MCG/ACT IN AERS   Inhalation   Inhale 2 puffs into the lungs every 6 (six) hours.   1 Inhaler   11   . MELOXICAM 7.5 MG PO TABS   Oral   Take 1 tablet (7.5 mg total) by mouth daily.   30 tablet   1   . OMEPRAZOLE 20 MG PO CPDR      TAKE 1 CAPSULE EVERY DAY   30 capsule   0   . PHENOBARBITAL 64.8 MG PO TABS      Take 3 tablets at  bedtime.   90 tablet   5   . SIMVASTATIN 40 MG PO TABS      TAKE 1 TABLET AT BEDTIME   31 tablet   10   . MICROSPACER MISC   Inhalation   Inhale 2 puffs into the lungs every 4 (four) hours as needed. For use with MDI   1 each   2   . TRAMADOL HCL 50 MG PO TABS   Oral   Take 1 tablet (50 mg total) by mouth every 6 (six) hours as needed for pain.   30 tablet   0     BP 139/78  Pulse 68  Temp 98.2 F (36.8 C) (Oral)  Resp 20  SpO2 100%  Physical Exam  Nursing note and vitals reviewed. Constitutional: He is oriented to person, place, and time. Vital signs are normal. He appears well-developed and well-nourished. He is active and cooperative.  HENT:  Head: Normocephalic.  Eyes: Conjunctivae normal are normal. Pupils are equal, round, and reactive to light. No scleral icterus.  Neck: Trachea normal. Neck supple.  Cardiovascular: Normal rate and regular rhythm.   Pulmonary/Chest: Effort normal and breath sounds normal.  Musculoskeletal: Normal range of motion.       Right knee: Normal.       Left knee: Normal.       Right ankle: Normal. Achilles tendon normal.       Right knee painful ROM, crepitus  palpated  Neurological: He is alert and oriented to person, place, and time. No cranial nerve deficit or sensory deficit.  Skin: Skin is warm and dry.  Psychiatric: He has a normal mood and affect. His speech is normal and behavior is normal. Judgment and thought content normal. Cognition and memory are normal.    ED Course  Procedures (including critical care time)  Labs Reviewed - No data to display Dg Knee Complete 4 Views Right  09/06/2012  *RADIOLOGY REPORT*  Clinical Data: Larey Seat 10 days ago with pain anteriorly  RIGHT KNEE - COMPLETE 4+ VIEW  Comparison: None.  Findings: The right knee joint spaces appear normal.  No fracture is seen.  No effusion is noted.  IMPRESSION: Negative.   Original Report Authenticated By: Dwyane Dee, M.D.      1. Knee pain, right       MDM  Knee sleeve, Nsaids, warm compresses, follow up with ortho if symptoms are not improved.        Johnsie Kindred, NP 09/06/12 1819

## 2012-09-06 NOTE — ED Notes (Signed)
Patient complains of right knee pain that radiates down through calf to ankle. Patient states he fell in his garage one and a half weeks ago and two days later his right knee started hurting. Patient states the pain is constant and he can't sleep due to pain.

## 2012-09-06 NOTE — ED Provider Notes (Signed)
Medical screening examination/treatment/procedure(s) were performed by non-physician practitioner and as supervising physician I was immediately available for consultation/collaboration.  Dameisha Tschida, M.D.   Rosabel Sermeno C Makhya Arave, MD 09/06/12 2026 

## 2012-10-31 ENCOUNTER — Other Ambulatory Visit: Payer: Self-pay | Admitting: Family Medicine

## 2012-11-02 ENCOUNTER — Other Ambulatory Visit: Payer: Self-pay | Admitting: Family Medicine

## 2012-11-02 ENCOUNTER — Telehealth: Payer: Self-pay | Admitting: Family Medicine

## 2012-11-02 MED ORDER — HYDROCODONE-ACETAMINOPHEN 2.5-325 MG PO TABS: 1.0000 | ORAL_TABLET | Freq: Three times a day (TID) | ORAL | Status: DC | PRN

## 2012-11-02 NOTE — Telephone Encounter (Signed)
Pt called back with an emergency number of [phone]

## 2012-11-02 NOTE — Telephone Encounter (Signed)
1.) called pt and he reports, that his Vicodin 5/500 is not available anymore. Needs new Rx. 2.) called [facility] # [phone]. Number busy x 3. Will try again later.  Lorenda Hatchet, Renato Battles

## 2012-11-02 NOTE — Telephone Encounter (Signed)
Changed due to discontinuation of hydrocodone with 500 mg APAP Given for one month since he's overdue to see me.

## 2012-11-02 NOTE — Telephone Encounter (Signed)
Pt states that they don't make the same strength of the hydrocodone any more and needs to know what to do .  His appt is next Tues,  2/25 but needs to get something in the mean time. [facility] [address]

## 2012-11-02 NOTE — Telephone Encounter (Signed)
Called pharmacy again. Number busy. Called pt and left message 'number at pharmacy busy. Does pt have different number?' .Richard Glass

## 2012-11-02 NOTE — Telephone Encounter (Signed)
Called pharmacy and the pharmacist Silva Bandy reports, that pt's Vicodin 2.5/325 will be available tomorrow. They ordered it.  I called pt back and informed to pick up his vicodin 2.5/325 tomorrow. No need to change. Pt verbalized understanding. Fwd. To PCP for info. Lorenda Hatchet, Renato Battles

## 2012-11-03 NOTE — Telephone Encounter (Signed)
Dose with 2.5 mg of hydrocodone not available. Replaced with the 5 mg dose.  Handwritten and faxed back to pharmacy

## 2012-11-10 ENCOUNTER — Encounter: Payer: Self-pay | Admitting: Family Medicine

## 2012-11-10 ENCOUNTER — Ambulatory Visit (INDEPENDENT_AMBULATORY_CARE_PROVIDER_SITE_OTHER): Payer: Medicare Other | Admitting: Family Medicine

## 2012-11-10 VITALS — BP 141/86 | HR 80 | Ht 68.0 in | Wt 187.0 lb

## 2012-11-10 DIAGNOSIS — M67919 Unspecified disorder of synovium and tendon, unspecified shoulder: Secondary | ICD-10-CM

## 2012-11-10 DIAGNOSIS — M25519 Pain in unspecified shoulder: Secondary | ICD-10-CM

## 2012-11-10 DIAGNOSIS — IMO0002 Reserved for concepts with insufficient information to code with codable children: Secondary | ICD-10-CM

## 2012-11-10 DIAGNOSIS — M25512 Pain in left shoulder: Secondary | ICD-10-CM

## 2012-11-10 DIAGNOSIS — M719 Bursopathy, unspecified: Secondary | ICD-10-CM

## 2012-11-10 DIAGNOSIS — K644 Residual hemorrhoidal skin tags: Secondary | ICD-10-CM

## 2012-11-10 DIAGNOSIS — R569 Unspecified convulsions: Secondary | ICD-10-CM

## 2012-11-10 MED ORDER — HYDROCORTISONE 2.5 % RE CREA: TOPICAL_CREAM | RECTAL | Status: AC

## 2012-11-10 MED ORDER — METHYLPREDNISOLONE ACETATE 40 MG/ML IJ SUSP
40.0000 mg | Freq: Once | INTRAMUSCULAR | Status: AC
  Administered 2012-11-10: 40 mg via INTRA_ARTICULAR

## 2012-11-10 NOTE — Assessment & Plan Note (Signed)
Continue prn treatment

## 2012-11-10 NOTE — Patient Instructions (Signed)
I hope the shoulder injection will help significantly. If not you can follow up with the orthopedic surgeon.  Please return to see Dr Sheffield Slider in 3months.

## 2012-11-10 NOTE — Progress Notes (Signed)
  Subjective:    Patient ID: MAANAV KASSABIAN, male    DOB: [birth year], 42 y.o.   MRN: [id number]  HPI Shoulder pain - his right shoulder that was operated on is doing well, but the left shoulder is increasingly painful 8/10. 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 Recently dug a ditch.   Back pain - about the same. Manageable with three times daily hydrocodone.   right knee - improved pain.   Hemorrhoids - itching and spots of bright red blood on the toilet paper.   GERD -no recent heartburn.    Review of Systems     Objective:   Physical Exam  Constitutional: He appears well-developed and well-nourished.  Cardiovascular: Normal rate and regular rhythm.   Pulmonary/Chest: Effort normal and breath sounds normal.  Musculoskeletal:  Pos impingement sign on left shoulder with negative drop test. Full range of motion but painful upper arc. Tender biceps and subacromial area. Injected with 1 cc Depomedrol and 4 cc xylocaine after consent, sterile prep, and cold spray.   Psychiatric: He has a normal mood and affect. His behavior is normal.          Assessment & Plan:

## 2012-11-10 NOTE — Assessment & Plan Note (Signed)
Injected with Depomedrol and xylocaine

## 2012-11-10 NOTE — Assessment & Plan Note (Signed)
unchanged

## 2012-11-10 NOTE — Assessment & Plan Note (Signed)
No recent seizures

## 2012-12-01 ENCOUNTER — Ambulatory Visit: Payer: Medicare Other | Admitting: Family Medicine

## 2012-12-10 ENCOUNTER — Ambulatory Visit (INDEPENDENT_AMBULATORY_CARE_PROVIDER_SITE_OTHER): Payer: Medicare Other | Admitting: Family Medicine

## 2012-12-10 ENCOUNTER — Encounter: Payer: Self-pay | Admitting: Family Medicine

## 2012-12-10 VITALS — BP 143/77 | HR 68 | Ht 69.0 in | Wt 183.0 lb

## 2012-12-10 DIAGNOSIS — M719 Bursopathy, unspecified: Secondary | ICD-10-CM

## 2012-12-10 DIAGNOSIS — IMO0002 Reserved for concepts with insufficient information to code with codable children: Secondary | ICD-10-CM

## 2012-12-10 DIAGNOSIS — J45909 Unspecified asthma, uncomplicated: Secondary | ICD-10-CM

## 2012-12-10 DIAGNOSIS — M67919 Unspecified disorder of synovium and tendon, unspecified shoulder: Secondary | ICD-10-CM

## 2012-12-10 DIAGNOSIS — J309 Allergic rhinitis, unspecified: Secondary | ICD-10-CM

## 2012-12-10 MED ORDER — MELOXICAM 7.5 MG PO TABS: 7.5000 mg | ORAL_TABLET | Freq: Every day | ORAL | Status: DC

## 2012-12-10 NOTE — Assessment & Plan Note (Signed)
No improvement after injection. Will give a course of Meloxicam and refer to Dr Cleophas Dunker or Farris Has at [facility]

## 2012-12-10 NOTE — Patient Instructions (Addendum)
Please take the Meloxicam for 10 days and do the following exercises. I'll refer you to Dr Cleophas Dunker at [facility] for further evaluation.  Shoulder Range of Motion Exercises The shoulder is the most flexible joint in the human body. Because of this it is also the most unstable joint in the body. All ages can develop shoulder problems. Early treatment of problems is necessary for a good outcome. People react to shoulder pain by decreasing the movement of the joint. After a brief period of time, the shoulder can become "frozen". This is an almost complete loss of the ability to move the damaged shoulder. Following injuries your caregivers can give you instructions on exercises to keep your range of motion (ability to move your shoulder freely), or regain it if it has been lost.  EXERCISES EXERCISES TO MAINTAIN THE MOBILITY OF YOUR SHOULDER: Codman's Exercise or Pendulum Exercise  This exercise may be performed in a prone (face-down) lying position or standing while leaning on a chair with the opposite arm. Its purpose is to relax the muscles in your shoulder and slowly but surely increase the range of motion and to relieve pain.  Lie on your stomach close to the side edge of the bed. Let your weak arm hang over the edge of the bed. Relax your shoulder, arm and hand. Let your shoulder blade relax and drop down.  Slowly and gently swing your arm forward and back. Do not use your neck muscles; relax them. It might be easier to have someone else gently start swinging your arm.  As pain decreases, increase your swing. To start, arm swing should begin at 15 degree angles. In time and as pain lessens, move to 30-45 degree angles. Start with swinging for about 15 seconds, and work towards swinging for 3 to 5 minutes.  This exercise may also be performed in a standing/bent over position.  Stand and hold onto a sturdy chair with your good arm. Bend forward at the waist and bend your knees slightly  to help protect your back. Relax your weak arm, let it hang limp. Relax your shoulder blade and let it drop.  Keep your shoulder relaxed and use body motion to swing your arm in small circles.  Stand up tall and relax.  Repeat motion and change direction of circles.  Start with swinging for about 30 seconds, and work towards swinging for 3 to 5 minutes. STRETCHING EXERCISES:  Lift your arm out in front of you with the elbow bent at 90 degrees. Using your other arm gently pull the elbow forward and across your body.  Bend one arm behind you with the palm facing outward. Using the other arm, hold a towel or rope and reach this arm up above your head, then bend it at the elbow to move your wrist to behind your neck. Grab the free end of the towel with the hand behind your back. Gently pull the towel up with the hand behind your neck, gradually increasing the pull on the hand behind the small of your back. Then, gradually pull down with the hand behind the small of your back. This will pull the hand and arm behind your neck further. Both shoulders will have an increased range of motion with repetition of this exercise. STRENGTHENING EXERCISES:  Standing with your arm at your side and straight out from your shoulder with the elbow bent at 90 degrees, hold onto a small weight and slowly raise your hand so it points straight  up in the air. Repeat this five times to begin with, and gradually increase to ten times. Do this four times per day. As you grow stronger you can gradually increase the weight.  Repeat the above exercise, only this time using an elastic band. Start with your hand up in the air and pull down until your hand is by your side. As you grow stronger, gradually increase the amount you pull by increasing the number or size of the elastic bands. Use the same amount of repetitions.  Standing with your hand at your side and holding onto a weight, gradually lift the hand in front of you until  it is over your head. Do the same also with the hand remaining at your side and lift the hand away from your body until it is again over your head. Repeat this five times to begin with, and gradually increase to ten times. Do this four times per day. As you grow stronger you can gradually increase the weight. Document Released: 06/01/2003 Document Revised: 11/25/2011 Document Reviewed: 09/02/2005 [facility] Patient Information 2013 [place], [place].

## 2012-12-10 NOTE — Assessment & Plan Note (Signed)
No current symptoms

## 2012-12-10 NOTE — Assessment & Plan Note (Signed)
symptoms controlled now in his seasonal recurrence

## 2012-12-10 NOTE — Progress Notes (Signed)
  Subjective:    Patient ID: Richard Glass, male    DOB: Jan 14, 1971, 42 y.o.   MRN: [id number]  HPI left shoulder pain - No improvement after injection. Hurts when he lifts his left arm past 90 degrees. Numbness in middle fingers of his left hand. 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 His post-surgery right arm is moving well without pain.   Chronic low back pain - continues the same without leg symptoms   Allergic rhinitis - having mild nasal congestion and itchy eyes. Has started his antihistamine and flonase.    Review of Systems     Objective:   Physical Exam  Constitutional: He appears well-developed.  Neck: Normal range of motion.  Cardiovascular: Normal rate and regular rhythm.   Pulmonary/Chest: Breath sounds normal.  Musculoskeletal: He exhibits no edema.  He has a painful arc on the left, but is able to put his left hand on his head. Mildly positive impingement sign on that side with negative drop test. Tender over left subacromial area. Normal range of motion of neck without radiation of pain into the left arm.   Neurological: He is alert.  Psychiatric: He has a normal mood and affect. His behavior is normal. Thought content normal.          Assessment & Plan:

## 2012-12-10 NOTE — Assessment & Plan Note (Signed)
Pain controlled on current hydrocodone dosing and he remains functional.

## 2012-12-17 ENCOUNTER — Other Ambulatory Visit: Payer: Self-pay | Admitting: Family Medicine

## 2012-12-17 DIAGNOSIS — R569 Unspecified convulsions: Secondary | ICD-10-CM

## 2012-12-17 MED ORDER — PHENOBARBITAL 64.8 MG PO TABS: ORAL_TABLET | ORAL | Status: DC

## 2012-12-18 ENCOUNTER — Other Ambulatory Visit: Payer: Self-pay | Admitting: Family Medicine

## 2012-12-25 NOTE — Telephone Encounter (Signed)
Called in prescription for alprazolam

## 2012-12-25 NOTE — Telephone Encounter (Signed)
Patient is calling because he has been waiting for the refill on his Xanax.  He will be have surgery on Wednesday and he doesn't understand what is taking so long.

## 2012-12-25 NOTE — Telephone Encounter (Signed)
Will fwd. To Dr.Brisco for review.

## 2013-01-18 ENCOUNTER — Other Ambulatory Visit: Payer: Self-pay | Admitting: Family Medicine

## 2013-01-18 NOTE — Telephone Encounter (Signed)
Printed and placed in fax pile

## 2013-01-22 ENCOUNTER — Other Ambulatory Visit: Payer: Self-pay | Admitting: Family Medicine

## 2013-01-25 NOTE — Telephone Encounter (Signed)
Please advise on refill for xanax - thanks !  Wyatt Haste, RN-BSN

## 2013-01-27 MED ORDER — IPRATROPIUM-ALBUTEROL 20-100 MCG/ACT IN AERS: 2.0000 | INHALATION_SPRAY | Freq: Four times a day (QID) | RESPIRATORY_TRACT | Status: DC

## 2013-01-27 NOTE — Telephone Encounter (Signed)
Did not refill xanax- epic documentation entered in error.  Dr. Sheffield Slider sent in Rx on 01/18/13 with refills.

## 2013-02-01 ENCOUNTER — Other Ambulatory Visit: Payer: Self-pay | Admitting: Family Medicine

## 2013-02-01 MED ORDER — ALPRAZOLAM 0.5 MG PO TABS: 0.5000 mg | ORAL_TABLET | Freq: Two times a day (BID) | ORAL | Status: DC | PRN

## 2013-02-01 NOTE — Telephone Encounter (Signed)
Written and attached to fax to send to [facility]

## 2013-02-02 ENCOUNTER — Encounter: Payer: Self-pay | Admitting: Family Medicine

## 2013-02-02 ENCOUNTER — Ambulatory Visit (INDEPENDENT_AMBULATORY_CARE_PROVIDER_SITE_OTHER): Payer: Medicare Other | Admitting: Family Medicine

## 2013-02-02 VITALS — BP 137/81 | HR 69 | Ht 69.0 in | Wt 182.0 lb

## 2013-02-02 DIAGNOSIS — R569 Unspecified convulsions: Secondary | ICD-10-CM

## 2013-02-02 DIAGNOSIS — G4700 Insomnia, unspecified: Secondary | ICD-10-CM

## 2013-02-02 DIAGNOSIS — M7550 Bursitis of unspecified shoulder: Secondary | ICD-10-CM

## 2013-02-02 DIAGNOSIS — IMO0002 Reserved for concepts with insufficient information to code with codable children: Secondary | ICD-10-CM

## 2013-02-02 DIAGNOSIS — M75100 Unspecified rotator cuff tear or rupture of unspecified shoulder, not specified as traumatic: Secondary | ICD-10-CM

## 2013-02-02 DIAGNOSIS — J309 Allergic rhinitis, unspecified: Secondary | ICD-10-CM

## 2013-02-02 HISTORY — DX: Insomnia, unspecified: G47.00

## 2013-02-02 NOTE — Assessment & Plan Note (Signed)
well controlled  

## 2013-02-02 NOTE — Assessment & Plan Note (Signed)
Full range of motion after surgery Consider cervical pathology if hand symptoms persist

## 2013-02-02 NOTE — Patient Instructions (Addendum)
Please return to see Dr Randolm Idol in 2.5  months. I will be retiring in the summer of this year. Thank you for allowing me to work with you in maintaining your health. Donnella Sham, MD will be my replacement beginning the second week in August. 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 Please let me know if you would like to be assigned to a different physician.   Try not to smoke at all.    Insomnia Insomnia is frequent trouble falling and/or staying asleep. Insomnia can be a long term problem or a short term problem. Both are common. Insomnia can be a short term problem when the wakefulness is related to a certain stress or worry. Long term insomnia is often related to ongoing stress during waking hours and/or poor sleeping habits. Overtime, sleep deprivation itself can make the problem worse. Every little thing feels more severe because you are overtired and your ability to cope is decreased. CAUSES   Stress, anxiety, and depression.  Poor sleeping habits.  Distractions such as TV in the bedroom.  Naps close to bedtime.  Engaging in emotionally charged conversations before bed.  Technical reading before sleep.  Alcohol and other sedatives. They may make the problem worse. They can hurt normal sleep patterns and normal dream activity.  Stimulants such as caffeine for several hours prior to bedtime.  Pain syndromes and shortness of breath can cause insomnia.  Exercise late at night.  Changing time zones may cause sleeping problems (jet lag). It is sometimes helpful to have someone observe your sleeping patterns. They should look for periods of not breathing during the night (sleep apnea). They should also look to see how long those periods last. If you live alone or observers are uncertain, you can also be observed at a sleep clinic where your sleep patterns will be professionally monitored. Sleep apnea requires a checkup and  treatment. Give your caregivers your medical history. Give your caregivers observations your family has made about your sleep.  SYMPTOMS   Not feeling rested in the morning.  Anxiety and restlessness at bedtime.  Difficulty falling and staying asleep. TREATMENT   Your caregiver may prescribe treatment for an underlying medical disorders. Your caregiver can give advice or help if you are using alcohol or other drugs for self-medication. Treatment of underlying problems will usually eliminate insomnia problems.  Medications can be prescribed for short time use. They are generally not recommended for lengthy use.  Over-the-counter sleep medicines are not recommended for lengthy use. They can be habit forming.  You can promote easier sleeping by making lifestyle changes such as:  Using relaxation techniques that help with breathing and reduce muscle tension.  Exercising earlier in the day.  Changing your diet and the time of your last meal. No night time snacks.  Establish a regular time to go to bed.  Counseling can help with stressful problems and worry.  Soothing music and white noise may be helpful if there are background noises you cannot remove.  Stop tedious detailed work at least one hour before bedtime. HOME CARE INSTRUCTIONS   Keep a diary. Inform your caregiver about your progress. This includes any medication side effects. See your caregiver regularly. Take note of:  Times when you are asleep.  Times when you are awake during the night.  The quality of your sleep.  How you feel the next day. This information will help your caregiver care for you.  Get  out of bed if you are still awake after 15 minutes. Read or do some quiet activity. Keep the lights down. Wait until you feel sleepy and go back to bed.  Keep regular sleeping and waking hours. Avoid naps.  Exercise regularly.  Avoid distractions at bedtime. Distractions include watching television or engaging  in any intense or detailed activity like attempting to balance the household checkbook.  Develop a bedtime ritual. Keep a familiar routine of bathing, brushing your teeth, climbing into bed at the same time each night, listening to soothing music. Routines increase the success of falling to sleep faster.  Use relaxation techniques. This can be using breathing and muscle tension release routines. It can also include visualizing peaceful scenes. You can also help control troubling or intruding thoughts by keeping your mind occupied with boring or repetitive thoughts like the old concept of counting sheep. You can make it more creative like imagining planting one beautiful flower after another in your backyard garden.  During your day, work to eliminate stress. When this is not possible use some of the previous suggestions to help reduce the anxiety that accompanies stressful situations. MAKE SURE YOU:   Understand these instructions.  Will watch your condition.  Will get help right away if you are not doing well or get worse. Document Released: 08/30/2000 Document Revised: 11/25/2011 Document Reviewed: 09/30/2007 [facility] Patient Information 2013 [place], [place].

## 2013-02-02 NOTE — Assessment & Plan Note (Signed)
He remains active on his current hydrocodone dosing

## 2013-02-02 NOTE — Assessment & Plan Note (Signed)
Despite his night time medications I didn't ask about RLS symptoms  Advised him not to watch TV after bedtime

## 2013-02-02 NOTE — Progress Notes (Signed)
  Subjective:    Patient ID: Richard Glass, male    DOB: [birth year], 42 y.o.   MRN: [id number]  HPI right shoulder pain - improved range of motion since Dr Madelon Lips did arthroscopic surgery last month, but still has volar forearm discomfort and numbness in the fingers of his left hand. He'll see Dr Madelon Lips in follow up in 3 days. He does get some tightness in his posterior neck at times, but doesn't note an activity that makes it worse. An MRI of the C-spine was normal in 2007.   Insomnia - recently having trouble going to sleep, despite being active helping his friend during the day. Says his pain is well controlled. Does watch TV in bed.   Seizures - none recently  Allergic rhinitis - controlled on his medications.   Hemorrhoids - using the HC cream. Not constipated.   Tobacco - smokes about one a week. Reminded that his father died of lung cancer  Review of Systems     Objective:   Physical Exam  Constitutional: He appears well-developed and well-nourished.  Neck: Normal range of motion.  Mild discomfort looking up to the left   Cardiovascular: Normal rate and regular rhythm.   Pulmonary/Chest: Effort normal and breath sounds normal. He has no wheezes.  Musculoskeletal:  Full range of motion left shoulder without apparent pain  Neurological: He is alert.  Normal motion and strength in UE's   Skin: Skin is warm.  Scratches on forearms Shoulder arthroscopy scars  Psychiatric: He has a normal mood and affect. His behavior is normal. Judgment and thought content normal.          Assessment & Plan:

## 2013-03-30 ENCOUNTER — Ambulatory Visit (INDEPENDENT_AMBULATORY_CARE_PROVIDER_SITE_OTHER): Payer: Medicare Other | Admitting: Family Medicine

## 2013-03-30 ENCOUNTER — Encounter: Payer: Self-pay | Admitting: Family Medicine

## 2013-03-30 VITALS — BP 120/80 | HR 72 | Ht 69.0 in | Wt 189.5 lb

## 2013-03-30 DIAGNOSIS — G252 Other specified forms of tremor: Secondary | ICD-10-CM

## 2013-03-30 DIAGNOSIS — IMO0002 Reserved for concepts with insufficient information to code with codable children: Secondary | ICD-10-CM

## 2013-03-30 DIAGNOSIS — G5622 Lesion of ulnar nerve, left upper limb: Secondary | ICD-10-CM

## 2013-03-30 DIAGNOSIS — R569 Unspecified convulsions: Secondary | ICD-10-CM

## 2013-03-30 DIAGNOSIS — G5620 Lesion of ulnar nerve, unspecified upper limb: Secondary | ICD-10-CM

## 2013-03-30 DIAGNOSIS — G4700 Insomnia, unspecified: Secondary | ICD-10-CM

## 2013-03-30 DIAGNOSIS — G250 Essential tremor: Secondary | ICD-10-CM

## 2013-03-30 HISTORY — DX: Lesion of ulnar nerve, left upper limb: G56.22

## 2013-03-30 MED ORDER — HYDROCODONE-ACETAMINOPHEN 5-325 MG PO TABS: 1.0000 | ORAL_TABLET | Freq: Four times a day (QID) | ORAL | Status: DC | PRN

## 2013-03-30 NOTE — Assessment & Plan Note (Signed)
He reports occasional pain going down left leg. I instructed him on crunch abdominal strengthening exercises.

## 2013-03-30 NOTE — Assessment & Plan Note (Signed)
improved

## 2013-03-30 NOTE — Progress Notes (Signed)
  Subjective:    Patient ID: Richard Glass, male    DOB: [birth year], 42 y.o.   MRN: [id number]  HPI left elbow pain - had left elbow surgery 3 weeks ago for numbness in left 4th and 5th fingers which persists. Can't fully straighten it yet.   Left shoulder pain better  Low back pain controlled Remains active helping his friend  Seizures - none recently. Sleeping better.    Review of Systems     Objective:   Physical Exam  Cardiovascular: Normal rate and regular rhythm.   Pulmonary/Chest: Effort normal and breath sounds normal. He has no wheezes.  Abdominal: Soft. He exhibits no distension and no mass. There is no tenderness.  Mild abdominal obesity with moderately weak muscles.  Musculoskeletal:  left elbow lacks 10 degrees of extension  Back symmetric on bending  Neurological:  Normal strength in left hand  Skin:  Surgical scar over ulnar groove area  Psychiatric: He has a normal mood and affect. His behavior is normal.          Assessment & Plan:

## 2013-03-30 NOTE — Patient Instructions (Addendum)
I will be retiring in the summer of this year. Thank you for allowing me to work with you in maintaining your health. Donnella Sham, MD will be my replacement beginning the second week in August. 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 Please let me know if you would like to be assigned to a different physician.   Please return to see Dr Gillermo Murdoch in 3 months.

## 2013-03-30 NOTE — Assessment & Plan Note (Signed)
May be too soon for neuropathy to have improved

## 2013-03-30 NOTE — Assessment & Plan Note (Signed)
Well controllled on Alprazolam and phenobarbital

## 2013-03-30 NOTE — Assessment & Plan Note (Signed)
well controlled  

## 2013-05-02 ENCOUNTER — Other Ambulatory Visit: Payer: Self-pay | Admitting: Family Medicine

## 2013-05-27 ENCOUNTER — Other Ambulatory Visit: Payer: Self-pay | Admitting: Family Medicine

## 2013-06-08 ENCOUNTER — Ambulatory Visit (INDEPENDENT_AMBULATORY_CARE_PROVIDER_SITE_OTHER): Payer: Medicare Other | Admitting: Family Medicine

## 2013-06-08 ENCOUNTER — Encounter: Payer: Self-pay | Admitting: Family Medicine

## 2013-06-08 VITALS — BP 121/74 | HR 60 | Temp 98.6°F | Ht 70.0 in | Wt 186.4 lb

## 2013-06-08 DIAGNOSIS — R569 Unspecified convulsions: Secondary | ICD-10-CM

## 2013-06-08 DIAGNOSIS — E785 Hyperlipidemia, unspecified: Secondary | ICD-10-CM

## 2013-06-08 DIAGNOSIS — J309 Allergic rhinitis, unspecified: Secondary | ICD-10-CM

## 2013-06-08 DIAGNOSIS — M26609 Unspecified temporomandibular joint disorder, unspecified side: Secondary | ICD-10-CM

## 2013-06-08 MED ORDER — SIMVASTATIN 40 MG PO TABS: 40.0000 mg | ORAL_TABLET | Freq: Every day | ORAL | Status: DC

## 2013-06-08 MED ORDER — PHENOBARBITAL 64.8 MG PO TABS: ORAL_TABLET | ORAL | Status: DC

## 2013-06-08 NOTE — Assessment & Plan Note (Signed)
TMJ of the right side noted. Conservative therapy with icing and anti-inflammatories as tolerated. Information on TMJ provided to patient.

## 2013-06-08 NOTE — Progress Notes (Signed)
  Subjective:    Patient ID: Richard Glass, male    DOB: 05/09/1971, 42 y.o.   MRN: [id number]  HPI 42 year old male presents for medication refills.  Seizure disorder-patient needs a refill of his phenobarbital, he denies missing doses, his last seizure was approximately 2-3 months ago at his last physician appointment, he describes his seizures as whole-body shaking  Allergic rhinitis-patient currently on Flonase and cetirizine, he is in need refills, symptoms are well controlled at this time that these medications  He has acute complaints of clicking and pain on the right side of his jaw for the past few months, there is some radiation to the right ear, this is worse with eating and with talking, he has not attempted any icing or heat to the area, he is unable to tolerate NSAIDs due to GI upset, no ringing in the ear , No radiation to the neck   Review of Systems  Constitutional: Negative for fever, chills and fatigue.  HENT: Negative for ear pain, congestion, rhinorrhea, neck stiffness, postnasal drip and tinnitus.   Respiratory: Negative for cough and choking.   Cardiovascular: Negative for chest pain and palpitations.  Gastrointestinal: Negative for nausea and diarrhea.       Objective:   Physical Exam Vitals: reviewed General: Pleasant male, no acute distress HEENT: Normocephalic, pupils are equal round and reactive to light, extra amounts are intact, bilateral TMs are pearly-gray without erythema or bulging, tenderness over the right TMJ, clicking noted over the TMJ joint with opening and closing of the mouth ,mild nasal rhinorrhea and bogginess noted, moist mucous membranes, uvula midline, no pharyngeal erythema or exudate noted, neck was supple Cardiac: Regular rate and rhythm, S1 and S2 present, no murmurs, no heaves or thrills Respiratory: clear to auscultation, normal effort Extremities: No edema Abdomen: Soft, nontender, bowel sounds present       Assessment & Plan:   Please see problem specific assessment and plan.

## 2013-06-08 NOTE — Assessment & Plan Note (Signed)
Currently well controlled. Refills provided

## 2013-06-08 NOTE — Assessment & Plan Note (Signed)
Currently well controlled on phenobarbital. Refill provided. Patient is not currently see a neurologist.

## 2013-06-08 NOTE — Patient Instructions (Addendum)
Return to office in 1-2 months for yearly physical and refill of anxiety medications. Check yearly lab work. Will have to set up lab appointment when fasting.   Temporomandibular Problems  Temporomandibular joint (TMJ) dysfunction means there are problems with the joint between your jaw and your skull. This is a joint lined by cartilage like other joints in your body but also has a small disc in the joint which keeps the bones from rubbing on each other. These joints are like other joints and can get inflamed (sore) from arthritis and other problems. When this joint gets sore, it can cause headaches and pain in the jaw and the face. CAUSES  Usually the arthritic types of problems are caused by soreness in the joint. Soreness in the joint can also be caused by overuse. This may come from grinding your teeth. It may also come from mis-alignment in the joint. DIAGNOSIS Diagnosis of this condition can often be made by history and exam. Sometimes your caregiver may need X-rays or an MRI scan to determine the exact cause. It may be necessary to see your dentist to determine if your teeth and jaws are lined up correctly. TREATMENT  Most of the time this problem is not serious; however, sometimes it can persist (become chronic). When this happens medications that will cut down on inflammation (soreness) help. Sometimes a shot of cortisone into the joint will be helpful. If your teeth are not aligned it may help for your dentist to make a splint for your mouth that can help this problem. If no physical problems can be found, the problem may come from tension. If tension is found to be the cause, biofeedback or relaxation techniques may be helpful. HOME CARE INSTRUCTIONS   Later in the day, applications of ice packs may be helpful. Ice can be used in a plastic bag with a towel around it to prevent frostbite to skin. This may be used about every 2 hours for 20 to 30 minutes, as needed while awake, or as directed  by your caregiver.  Only take over-the-counter or prescription medicines for pain, discomfort, or fever as directed by your caregiver.  If physical therapy was prescribed, follow your caregiver's directions.  Wear mouth appliances as directed if they were given. Document Released: 05/28/2001 Document Revised: 11/25/2011 Document Reviewed: 09/04/2008 [facility] Patient Information 2014 [place], [place].

## 2013-06-14 ENCOUNTER — Other Ambulatory Visit: Payer: Self-pay | Admitting: Family Medicine

## 2013-06-16 ENCOUNTER — Other Ambulatory Visit: Payer: Self-pay | Admitting: Family Medicine

## 2013-07-06 ENCOUNTER — Other Ambulatory Visit: Payer: Medicare Other

## 2013-07-06 DIAGNOSIS — R569 Unspecified convulsions: Secondary | ICD-10-CM

## 2013-07-06 DIAGNOSIS — E785 Hyperlipidemia, unspecified: Secondary | ICD-10-CM

## 2013-07-06 LAB — BASIC METABOLIC PANEL
BUN: 9 mg/dL (ref 6–23)
CO2: 28 mEq/L (ref 19–32)
Calcium: 9.7 mg/dL (ref 8.4–10.5)
Chloride: 105 mEq/L (ref 96–112)
Creat: 0.8 mg/dL (ref 0.50–1.35)
Glucose, Bld: 91 mg/dL (ref 70–99)
Potassium: 4.4 mEq/L (ref 3.5–5.3)
Sodium: 139 mEq/L (ref 135–145)

## 2013-07-06 LAB — LIPID PANEL
Cholesterol: 155 mg/dL (ref 0–200)
HDL: 42 mg/dL (ref >39)
LDL Cholesterol: 86 mg/dL (ref 0–99)
Total CHOL/HDL Ratio: 3.7 Ratio
Triglycerides: 133 mg/dL (ref <150)
VLDL: 27 mg/dL (ref 0–40)

## 2013-07-06 LAB — CBC WITH DIFFERENTIAL/PLATELET
Basophils Absolute: 0 10*3/uL (ref 0.0–0.1)
Basophils Relative: 0 % (ref 0–1)
Eosinophils Absolute: 0.2 10*3/uL (ref 0.0–0.7)
Eosinophils Relative: 2 % (ref 0–5)
HCT: 45.7 % (ref 39.0–52.0)
Hemoglobin: 16.1 g/dL (ref 13.0–17.0)
Lymphocytes Relative: 30 % (ref 12–46)
Lymphs Abs: 3.4 10*3/uL (ref 0.7–4.0)
MCH: 31.2 pg (ref 26.0–34.0)
MCHC: 35.2 g/dL (ref 30.0–36.0)
MCV: 88.6 fL (ref 78.0–100.0)
Monocytes Absolute: 1 10*3/uL (ref 0.1–1.0)
Monocytes Relative: 9 % (ref 3–12)
Neutro Abs: 6.6 10*3/uL (ref 1.7–7.7)
Neutrophils Relative %: 59 % (ref 43–77)
Platelets: 327 10*3/uL (ref 150–400)
RBC: 5.16 MIL/uL (ref 4.22–5.81)
RDW: 13.3 % (ref 11.5–15.5)
WBC: 11.2 10*3/uL — ABNORMAL HIGH (ref 4.0–10.5)

## 2013-07-06 LAB — TSH: TSH: 1.353 u[IU]/mL (ref 0.350–4.500)

## 2013-07-06 NOTE — Progress Notes (Signed)
BMP,CBC WITH DIFF,FLP ,TSH AND PHENOBARB LEVEL DONE TODAY MARCI HOLDER

## 2013-07-07 LAB — PHENOBARBITAL LEVEL: Phenobarbital: 29.1 ug/mL (ref 15.0–40.0)

## 2013-07-08 ENCOUNTER — Encounter: Payer: Self-pay | Admitting: Family Medicine

## 2013-07-13 ENCOUNTER — Encounter: Payer: Self-pay | Admitting: Family Medicine

## 2013-07-13 ENCOUNTER — Ambulatory Visit (INDEPENDENT_AMBULATORY_CARE_PROVIDER_SITE_OTHER): Payer: Medicare Other | Admitting: Family Medicine

## 2013-07-13 VITALS — BP 112/71 | HR 66 | Temp 98.0°F | Wt 181.0 lb

## 2013-07-13 DIAGNOSIS — E785 Hyperlipidemia, unspecified: Secondary | ICD-10-CM

## 2013-07-13 DIAGNOSIS — M26609 Unspecified temporomandibular joint disorder, unspecified side: Secondary | ICD-10-CM

## 2013-07-13 DIAGNOSIS — Z0000 Encounter for general adult medical examination without abnormal findings: Secondary | ICD-10-CM

## 2013-07-13 MED ORDER — ALPRAZOLAM 0.5 MG PO TABS: 0.5000 mg | ORAL_TABLET | Freq: Two times a day (BID) | ORAL | Status: DC | PRN

## 2013-07-13 MED ORDER — HYDROCODONE-ACETAMINOPHEN 5-325 MG PO TABS: 1.0000 | ORAL_TABLET | Freq: Four times a day (QID) | ORAL | Status: DC | PRN

## 2013-07-13 NOTE — Assessment & Plan Note (Signed)
Well-controlled based on recent lipid profile. Currently on simvastatin 40 mg daily. ASCVD risk score calculated to be 3.5% for the next 10 years. Discussed the risks and benefits of continuing statin therapy. -Patient elected to continue therapy at this time

## 2013-07-13 NOTE — Assessment & Plan Note (Signed)
Patient returns for annual well visit examination. He is up-to-date on immunizations. He is not a candidate for colonoscopy at this time. He has no current urinary complaints and therefore is not a candidate for DRE/PSA. Patient is at low risk for STD and declines testing at this time. He has a living will in place. Counseled to continue to exercise on a regular basis. -Return to office in one year for annual visit.

## 2013-07-13 NOTE — Patient Instructions (Signed)
Preventive Care for Adults, Male A healthy lifestyle and preventive care can promote health and wellness. Preventive health guidelines for men include the following key practices:  A routine yearly physical is a good way to check with your caregiver about your health and preventative screening. It is a chance to share any concerns and updates on your health, and to receive a thorough exam.  Visit your dentist for a routine exam and preventative care every 6 months. Brush your teeth twice a day and floss once a day. Good oral hygiene prevents tooth decay and gum disease.  The frequency of eye exams is based on your age, health, family medical history, use of contact lenses, and other factors. Follow your caregiver's recommendations for frequency of eye exams.  Eat a healthy diet. Foods like vegetables, fruits, whole grains, low-fat dairy products, and lean protein foods contain the nutrients you need without too many calories. Decrease your intake of foods high in solid fats, added sugars, and salt. Eat the right amount of calories for you.Get information about a proper diet from your caregiver, if necessary.  Regular physical exercise is one of the most important things you can do for your health. Most adults should get at least 150 minutes of moderate-intensity exercise (any activity that increases your heart rate and causes you to sweat) each week. In addition, most adults need muscle-strengthening exercises on 2 or more days a week.  Maintain a healthy weight. The body mass index (BMI) is a screening tool to identify possible weight problems. It provides an estimate of body fat based on height and weight. Your caregiver can help determine your BMI, and can help you achieve or maintain a healthy weight.For adults 20 years and older:  A BMI below 18.5 is considered underweight.  A BMI of 18.5 to 24.9 is normal.  A BMI of 25 to 29.9 is considered overweight.  A BMI of 30 and above is  considered obese.  Maintain normal blood lipids and cholesterol levels by exercising and minimizing your intake of saturated fat. Eat a balanced diet with plenty of fruit and vegetables. Blood tests for lipids and cholesterol should begin at age 20 and be repeated every 5 years. If your lipid or cholesterol levels are high, you are over 50, or you are a high risk for heart disease, you may need your cholesterol levels checked more frequently.Ongoing high lipid and cholesterol levels should be treated with medicines if diet and exercise are not effective.  If you smoke, find out from your caregiver how to quit. If you do not use tobacco, do not start.  If you choose to drink alcohol, do not exceed 2 drinks per day. One drink is considered to be 12 ounces (355 mL) of beer, 5 ounces (148 mL) of wine, or 1.5 ounces (44 mL) of liquor.  Avoid use of street drugs. Do not share needles with anyone. Ask for help if you need support or instructions about stopping the use of drugs.  High blood pressure causes heart disease and increases the risk of stroke. Your blood pressure should be checked at least every 1 to 2 years. Ongoing high blood pressure should be treated with medicines, if weight loss and exercise are not effective.  If you are 45 to 44 years old, ask your caregiver if you should take aspirin to prevent heart disease.  Diabetes screening involves taking a blood sample to check your fasting blood sugar level. This should be done once every 3 years,   after age 45, if you are within normal weight and without risk factors for diabetes. Testing should be considered at a younger age or be carried out more frequently if you are overweight and have at least 1 risk factor for diabetes.  Colorectal cancer can be detected and often prevented. Most routine colorectal cancer screening begins at the age of 50 and continues through age 75. However, your caregiver may recommend screening at an earlier age if you  have risk factors for colon cancer. On a yearly basis, your caregiver may provide home test kits to check for hidden blood in the stool. Use of a small camera at the end of a tube, to directly examine the colon (sigmoidoscopy or colonoscopy), can detect the earliest forms of colorectal cancer. Talk to your caregiver about this at age 50, when routine screening begins. Direct examination of the colon should be repeated every 5 to 10 years through age 75, unless early forms of pre-cancerous polyps or small growths are found.  Hepatitis C blood testing is recommended for all people born from 1945 through 1965 and any individual with known risks for hepatitis C.  Practice safe sex. Use condoms and avoid high-risk sexual practices to reduce the spread of sexually transmitted infections (STIs). STIs include gonorrhea, chlamydia, syphilis, trichomonas, herpes, HPV, and human immunodeficiency virus (HIV). Herpes, HIV, and HPV are viral illnesses that have no cure. They can result in disability, cancer, and death.  A one-time screening for abdominal aortic aneurysm (AAA) and surgical repair of large AAAs by sound wave imaging (ultrasonography) is recommended for ages 65 to 75 years who are current or former smokers.  Healthy men should no longer receive prostate-specific antigen (PSA) blood tests as part of routine cancer screening. Consult with your caregiver about prostate cancer screening.  Testicular cancer screening is not recommended for adult males who have no symptoms. Screening includes self-exam, caregiver exam, and other screening tests. Consult with your caregiver about any symptoms you have or any concerns you have about testicular cancer.  Use sunscreen with skin protection factor (SPF) of 30 or more. Apply sunscreen liberally and repeatedly throughout the day. You should seek shade when your shadow is shorter than you. Protect yourself by wearing long sleeves, pants, a wide-brimmed hat, and  sunglasses year round, whenever you are outdoors.  Once a month, do a whole body skin exam, using a mirror to look at the skin on your back. Notify your caregiver of new moles, moles that have irregular borders, moles that are larger than a pencil eraser, or moles that have changed in shape or color.  Stay current with required immunizations.  Influenza. You need a dose every fall (or winter). The composition of the flu vaccine changes each year, so being vaccinated once is not enough.  Pneumococcal polysaccharide. You need 1 to 2 doses if you smoke cigarettes or if you have certain chronic medical conditions. You need 1 dose at age 65 (or older) if you have never been vaccinated.  Tetanus, diphtheria, pertussis (Tdap, Td). Get 1 dose of Tdap vaccine if you are younger than age 65 years, are over 65 and have contact with an infant, are a healthcare worker, or simply want to be protected from whooping cough. After that, you need a Td booster dose every 10 years. Consult your caregiver if you have not had at least 3 tetanus and diphtheria-containing shots sometime in your life or have a deep or dirty wound.  HPV. This vaccine is recommended   for males 13 through 44 years of age. This vaccine may be given to men 22 through 44 years of age who have not completed the 3 dose series. It is recommended for men through age 26 who have sex with men or whose immune system is weakened because of HIV infection, other illness, or medications. The vaccine is given in 3 doses over 6 months.  Measles, mumps, rubella (MMR). You need at least 1 dose of MMR if you were born in 1957 or later. You may also need a 2nd dose.  Meningococcal. If you are age 19 to 21 years and a first-year college student living in a residence hall, or have one of several medical conditions, you need to get vaccinated against meningococcal disease. You may also need additional booster doses.  Zoster (shingles). If you are age 60 years or  older, you should get this vaccine.  Varicella (chickenpox). If you have never had chickenpox or you were vaccinated but received only 1 dose, talk to your caregiver to find out if you need this vaccine.  Hepatitis A. You need this vaccine if you have a specific risk factor for hepatitis A virus infection, or you simply wish to be protected from this disease. The vaccine is usually given as 2 doses, 6 to 18 months apart.  Hepatitis B. You need this vaccine if you have a specific risk factor for hepatitis B virus infection or you simply wish to be protected from this disease. The vaccine is given in 3 doses, usually over 6 months. Preventative Service / Frequency Ages 19 to 39  Blood pressure check.** / Every 1 to 2 years.  Lipid and cholesterol check.** / Every 5 years beginning at age 20.  Hepatitis C blood test.** / For any individual with known risks for hepatitis C.  Skin self-exam. / Monthly.  Influenza immunization.** / Every year.  Pneumococcal polysaccharide immunization.** / 1 to 2 doses if you smoke cigarettes or if you have certain chronic medical conditions.  Tetanus, diphtheria, pertussis (Tdap,Td) immunization. / A one-time dose of Tdap vaccine. After that, you need a Td booster dose every 10 years.  HPV immunization. / 3 doses over 6 months, if 26 and younger.  Measles, mumps, rubella (MMR) immunization. / You need at least 1 dose of MMR if you were born in 1957 or later. You may also need a 2nd dose.  Meningococcal immunization. / 1 dose if you are age 19 to 21 years and a first-year college student living in a residence hall, or have one of several medical conditions, you need to get vaccinated against meningococcal disease. You may also need additional booster doses.  Varicella immunization.** / Consult your caregiver.  Hepatitis A immunization.** / Consult your caregiver. 2 doses, 6 to 18 months apart.  Hepatitis B immunization.** / Consult your caregiver. 3 doses  usually over 6 months. Ages 40 to 64  Blood pressure check.** / Every 1 to 2 years.  Lipid and cholesterol check.** / Every 5 years beginning at age 20.  Fecal occult blood test (FOBT) of stool. / Every year beginning at age 50 and continuing until age 75. You may not have to do this test if you get colonoscopy every 10 years.  Flexible sigmoidoscopy** or colonoscopy.** / Every 5 years for a flexible sigmoidoscopy or every 10 years for a colonoscopy beginning at age 50 and continuing until age 75.  Hepatitis C blood test.** / For all people born from 1945 through 1965 and any   individual with known risks for hepatitis C.  Skin self-exam. / Monthly.  Influenza immunization.** / Every year.  Pneumococcal polysaccharide immunization.** / 1 to 2 doses if you smoke cigarettes or if you have certain chronic medical conditions.  Tetanus, diphtheria, pertussis (Tdap/Td) immunization.** / A one-time dose of Tdap vaccine. After that, you need a Td booster dose every 10 years.  Measles, mumps, rubella (MMR) immunization. / You need at least 1 dose of MMR if you were born in 1957 or later. You may also need a 2nd dose.  Varicella immunization.**/ Consult your caregiver.  Meningococcal immunization.** / Consult your caregiver.  Hepatitis A immunization.** / Consult your caregiver. 2 doses, 6 to 18 months apart.  Hepatitis B immunization.** / Consult your caregiver. 3 doses, usually over 6 months. Ages 78 and over  Blood pressure check.** / Every 1 to 2 years.  Lipid and cholesterol check.**/ Every 5 years beginning at age 20.  Fecal occult blood test (FOBT) of stool. / Every year beginning at age 16 and continuing until age 81. You may not have to do this test if you get colonoscopy every 10 years.  Flexible sigmoidoscopy** or colonoscopy.** / Every 5 years for a flexible sigmoidoscopy or every 10 years for a colonoscopy beginning at age 27 and continuing until age 36.  Hepatitis C blood  test.** / For all people born from [birth year] through 1965 and any individual with known risks for hepatitis C.  Abdominal aortic aneurysm (AAA) screening.** / A one-time screening for ages 78 to 3 years who are current or former smokers.  Skin self-exam. / Monthly.  Influenza immunization.** / Every year.  Pneumococcal polysaccharide immunization.** / 1 dose at age 75 (or older) if you have never been vaccinated.  Tetanus, diphtheria, pertussis (Tdap, Td) immunization. / A one-time dose of Tdap vaccine if you are over 65 and have contact with an infant, are a Research scientist (physical sciences), or simply want to be protected from whooping cough. After that, you need a Td booster dose every 10 years.  Varicella immunization. ** / Consult your caregiver.  Meningococcal immunization.** / Consult your caregiver.  Hepatitis A immunization. ** / Consult your caregiver. 2 doses, 6 to 18 months apart.  Hepatitis B immunization.** / Check with your caregiver. 3 doses, usually over 6 months. **Family history and personal history of risk and conditions may change your caregiver's recommendations. Document Released: 10/29/2001 Document Revised: 11/25/2011 Document Reviewed: 01/28/2011 [facility] Patient Information 2014 [place], [place].  Return visit in one month to follow up on back pain and anxiety.

## 2013-07-13 NOTE — Progress Notes (Signed)
  Subjective:    Patient ID: Richard Glass, male    DOB: [birth year], 42 y.o.   MRN: [id number]  HPI 42 year old male presents for annual well visit examination.  Diet-patient reports eating 3 square meals a day, breakfast typically consists of cereal and/or a sausage and biscuit, lunch typically includes vegetables, dinner typically includes a meat and vegetable.  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  Respiratory: Negative for apnea, cough, choking and shortness of breath.   Cardiovascular: Negative for chest pain and palpitations.  Gastrointestinal: Negative for nausea, vomiting, diarrhea and constipation.  Genitourinary: Negative for dysuria, urgency, frequency, hematuria, discharge and penile pain.  Musculoskeletal: Positive for arthralgias.  Neurological: Negative for seizures, syncope, weakness, numbness and headaches.       Objective:   Physical Exam Vitals: Reviewed  General: Pleasant male, no acute distress HEENT: Normocephalic, pupils are equal round and reactive to light, extraocular movements are intact, no scleral icterus, right TM wasn't  discovered by cerumen, left TM was pearly-gray without exudate or bulging, nasal septum midline, no rhinorrhea, moist mucous membranes, uvula midline, neck was supple, no thyromegaly, no anterior posterior cervical lymphadenopathy was appreciated next and cardiac: Regular rate and rhythm, S1 and S2 present, no murmurs, no heaves or thrills Respiratory: Clear to auscultation bilaterally, normal effort Abdomen: Soft, nontender, bowel sounds present Neuro: Normal gait, strength out of 5 in all extremities, cranial nerves II through XII were intact Skin: No abnormal nevi noted, no rash, scar over right shoulder from previous surgery Extremities: No edema, 2+ radial and dorsalis pedis pulse  Reviewed recent lab work. Calculated ASCVD risk score to be 3.5% over the next 10 years (stated smoking as yes due to recent use)     Assessment & Plan:  Please see problem specific assessment and plan.

## 2013-07-13 NOTE — Assessment & Plan Note (Signed)
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 He is intolerant of NSAIDs. -Patient was counseled to Google a You-Tube video on TMJ exercises that he can perform her home -He also has poor dentition and was counseled to see a dentist.

## 2013-07-25 ENCOUNTER — Other Ambulatory Visit: Payer: Self-pay | Admitting: Family Medicine

## 2013-08-06 ENCOUNTER — Encounter: Payer: Self-pay | Admitting: Family Medicine

## 2013-08-06 ENCOUNTER — Ambulatory Visit (INDEPENDENT_AMBULATORY_CARE_PROVIDER_SITE_OTHER): Payer: Medicare Other | Admitting: Family Medicine

## 2013-08-06 VITALS — BP 124/80 | HR 69 | Ht 69.0 in | Wt 186.6 lb

## 2013-08-06 DIAGNOSIS — IMO0002 Reserved for concepts with insufficient information to code with codable children: Secondary | ICD-10-CM

## 2013-08-06 DIAGNOSIS — J309 Allergic rhinitis, unspecified: Secondary | ICD-10-CM

## 2013-08-06 DIAGNOSIS — H6121 Impacted cerumen, right ear: Secondary | ICD-10-CM

## 2013-08-06 DIAGNOSIS — F411 Generalized anxiety disorder: Secondary | ICD-10-CM

## 2013-08-06 DIAGNOSIS — H6120 Impacted cerumen, unspecified ear: Secondary | ICD-10-CM | POA: Insufficient documentation

## 2013-08-06 MED ORDER — HYDROCODONE-ACETAMINOPHEN 5-325 MG PO TABS: 1.0000 | ORAL_TABLET | Freq: Four times a day (QID) | ORAL | Status: DC | PRN

## 2013-08-06 MED ORDER — ALPRAZOLAM 0.5 MG PO TABS: 0.5000 mg | ORAL_TABLET | Freq: Two times a day (BID) | ORAL | Status: DC | PRN

## 2013-08-06 MED ORDER — CARBAMIDE PEROXIDE 6.5 % OT SOLN: 5.0000 [drp] | Freq: Two times a day (BID) | OTIC | Status: DC

## 2013-08-06 MED ORDER — FLUTICASONE PROPIONATE 50 MCG/ACT NA SUSP: 2.0000 | Freq: Every day | NASAL | Status: DC

## 2013-08-06 NOTE — Patient Instructions (Signed)
Refills provided for the next 3 months.  For you nose bleed stop Flonase for the next 2 weeks, continue humidified air  Ear wax- start debrox drops  Return to office 3 months or sooner if needed.

## 2013-08-08 DIAGNOSIS — F411 Generalized anxiety disorder: Secondary | ICD-10-CM | POA: Insufficient documentation

## 2013-08-08 HISTORY — DX: Generalized anxiety disorder: F41.1

## 2013-08-08 NOTE — Progress Notes (Signed)
  Subjective:    Patient ID: Richard Glass, male    DOB: 04/03/1971, 42 y.o.   MRN: [id number]  HPI 42 y/o male presents for follow up of multiple medical issues and for refills of pain medications.   Anxiety - states that anxiety is controlled with twice daily xanax, he does have trouble sleeping at times even when he takes xanax at night, this has been stable for many years on current dose, he is hesitant to wean off of xanax, reports no recent panic attacks, denies depressed mood  Allergic Rhinitis - needs refill of Flonase, states that symptoms are controled with Flonase and Zyrtec, he reports a bloody nose earlier today, has 1-2 nose bleed per week during the winter, does use a humidifier at home  Chronic Back pain - reports that his pain is controlled with Norco, currently 5/10, when it is the worst is is a 8/10, does report radiation of pain to the left buttock, does not go down he leg, reports numbness in the left foot when he crosses his left leg over the right, no weakness, no saddle anesthesia, no bladder or bowel incontinence, no weakness of the lower extremities, reports previously being seen by pain management physician who performed steroid injections of the back, these provided minimal relief, has had gabapentin in the past which provided minimal relief and caused leg swelling, has never tried Lyrica  Former smoker, he is a Product manager, chews tobacco   Review of Systems  Constitutional: Negative for chills and fatigue.  Respiratory: Negative for chest tightness and shortness of breath.   Cardiovascular: Negative for chest pain, palpitations and leg swelling.  Gastrointestinal: Negative for nausea, vomiting, diarrhea and constipation.  Psychiatric/Behavioral: Negative for suicidal ideas, hallucinations and behavioral problems.       Objective:   Physical Exam Vitals: reviewed Gen: pleasant male, NAD HEENT: PERRL, EOMI, MMM, neck supple Cardiac: RRR, S1 and S2 present, no  murmurs Resp: CTAB, normal effort MSK: generalized muscle spasm and tenderness of lumber spine (both paraspinal and midline), SLR negative bilaterally Neuro: strength 5/5 for lower extremity muscle testing, 2+ patellar and achilles tendon reflexes, sensation to gross touch intact in lower etremities  Reviewed MRI lumber spine results from 2005 - no significant neuro impingement, mild facet degeneration     Assessment & Plan:  Please see problem specific assessment and plan.

## 2013-08-08 NOTE — Assessment & Plan Note (Signed)
Pain stable on current dose of Norco. Has had urine drug screen in the past 12 months which was consistent with medications at that time. There appears to be no physical cause for his pain (based on MRI 2005).  -Consider MRI lumber spine if symptoms worsen -Refills provided (continue urine drug screens every 6-12 months) -Attempt to decreased pain medications with time. Patient not interested in Lyrica at this time however will need to attempt trial so as to wean Narcotics.

## 2013-08-08 NOTE — Assessment & Plan Note (Signed)
Stable on current meds, Refills provided. Given recent nose bleed recommended that patient stop Flonase for 2 weeks.

## 2013-08-08 NOTE — Assessment & Plan Note (Signed)
Well controlled with Xanax 0.5 mg BID PRN. May consider referral to behavioral health at next visit. Would like to wean patient from Xanax.

## 2013-11-01 ENCOUNTER — Ambulatory Visit (INDEPENDENT_AMBULATORY_CARE_PROVIDER_SITE_OTHER): Payer: Medicare Other | Admitting: Family Medicine

## 2013-11-01 ENCOUNTER — Encounter: Payer: Self-pay | Admitting: Family Medicine

## 2013-11-01 VITALS — BP 120/80 | HR 83 | Temp 98.4°F | Wt 190.4 lb

## 2013-11-01 DIAGNOSIS — M7550 Bursitis of unspecified shoulder: Secondary | ICD-10-CM

## 2013-11-01 DIAGNOSIS — F411 Generalized anxiety disorder: Secondary | ICD-10-CM

## 2013-11-01 DIAGNOSIS — IMO0002 Reserved for concepts with insufficient information to code with codable children: Secondary | ICD-10-CM

## 2013-11-01 DIAGNOSIS — M75100 Unspecified rotator cuff tear or rupture of unspecified shoulder, not specified as traumatic: Secondary | ICD-10-CM

## 2013-11-01 DIAGNOSIS — G894 Chronic pain syndrome: Secondary | ICD-10-CM

## 2013-11-01 MED ORDER — ALPRAZOLAM 0.5 MG PO TABS: 0.5000 mg | ORAL_TABLET | Freq: Two times a day (BID) | ORAL | Status: DC | PRN

## 2013-11-01 MED ORDER — ALPRAZOLAM 0.5 MG PO TABS
0.5000 mg | ORAL_TABLET | Freq: Two times a day (BID) | ORAL | Status: DC | PRN
Start: 2013-11-01 — End: 2013-11-01

## 2013-11-01 MED ORDER — HYDROCODONE-ACETAMINOPHEN 5-325 MG PO TABS: 1.0000 | ORAL_TABLET | Freq: Four times a day (QID) | ORAL | Status: DC | PRN

## 2013-11-01 NOTE — Assessment & Plan Note (Signed)
Stable on current regimen. Wean as able to.

## 2013-11-01 NOTE — Patient Instructions (Signed)
Return in 3 months for refill on medications  See Orthopedic physician tomorrow, please let me know what the results of your nerve conduction study.

## 2013-11-01 NOTE — Assessment & Plan Note (Signed)
Stable on current narcotic regimen. Decrease as able to.

## 2013-11-01 NOTE — Assessment & Plan Note (Signed)
Patient has had worsening of symptoms.Patient to follow up with Orthopedics in regards to recent nerve conduction study. I suspect that patient may be developing a frozen shoulder. No further workup from my standpoint however stressed importance of following up with Orthopedics.

## 2013-11-01 NOTE — Assessment & Plan Note (Signed)
Stable on current dose of Xanax.

## 2013-11-01 NOTE — Progress Notes (Signed)
   Subjective:    Patient ID: Richard Glass, male    DOB: [birth year], 43 y.o.   MRN: [id number]  HPI 43 year old male presents for refill medications.  Chronic low back pain and bilateral shoulder pain - please refer to below for additional history on left shoulder pain, chronic low back pain is stable, no numbness or weakness, no saddle anesthesia, patient continues to take Vicodin 3 times daily which controls his daily pain  Anxiety-patient currently on Xanax twice daily, symptoms are controlled, patient states his previously not tolerated and SSRI, not interested in changing regimen at this time  Left shoulder pain-patient has been history of previous left shoulder pain for which she has previously seen by an orthopedic surgeon, right shoulder has previously undergone right rotator cuff tear, left shoulder did undergo arthroplasty approximately one year ago, his symptoms did improve at that time however he has had increasing pain especially with reaching overhead, he has attempted icing and heat at home as well as multiple steroid injections performed by the orthopedic surgeon which has provided minimal relief of his symptoms, patient states that he did recently underwent I nerve conduction study due to some numbness in his left hand, he is scheduled followup with the orthopedic surgeon tomorrow in regards to these results, denies associated neck pain, he has not been dropping objects, denies weakness in the hand   Review of Systems  Constitutional: Negative for fever, chills and fatigue.  Respiratory: Negative for cough, choking and shortness of breath.   Gastrointestinal: Negative for nausea, diarrhea and constipation.  Musculoskeletal: Positive for arthralgias, back pain and joint swelling. Negative for gait problem.  Psychiatric/Behavioral: Negative for suicidal ideas, self-injury and agitation.       Objective:   Physical Exam Vitals: Reviewed General: Pleasant Caucasian male, no  acute distress Cardiac: Regular rate and rhythm, S1 and S2 present, no murmurs, no heaves or thrills Respiratory: Clear to auscultation bilaterally, normal effort MSK: Diminished anterior flexion (able to flex to approximately 90 degrees on own accord, active flexion up to 120 degrees), Diminished Abduction (80 degrees on own, 110 degrees with active flexion), positive Neers, positive Hawkins, Strength to abduction, arm flexion, arm extension all 5/5. Empty can 5/5. Lift off test 5/5, only able to reach gluteal area on back, diffuse tenderness of shoulder primarily over Trapezius and AC joint  Patient reports xrays and MRI of shoulder performed in past year, not available for review in EPIC.       Assessment & Plan:  Please see problem specific assessment and plan.

## 2013-11-30 ENCOUNTER — Encounter: Payer: Self-pay | Admitting: Family Medicine

## 2013-11-30 ENCOUNTER — Ambulatory Visit (INDEPENDENT_AMBULATORY_CARE_PROVIDER_SITE_OTHER): Payer: Medicare Other | Admitting: Family Medicine

## 2013-11-30 VITALS — BP 125/82 | HR 80 | Ht 69.0 in | Wt 192.5 lb

## 2013-11-30 DIAGNOSIS — R569 Unspecified convulsions: Secondary | ICD-10-CM

## 2013-11-30 LAB — CBC
HCT: 46.1 % (ref 39.0–52.0)
Hemoglobin: 16 g/dL (ref 13.0–17.0)
MCH: 31.5 pg (ref 26.0–34.0)
MCHC: 34.7 g/dL (ref 30.0–36.0)
MCV: 90.7 fL (ref 78.0–100.0)
Platelets: 328 10*3/uL (ref 150–400)
RBC: 5.08 MIL/uL (ref 4.22–5.81)
RDW: 13.8 % (ref 11.5–15.5)
WBC: 13.1 10*3/uL — ABNORMAL HIGH (ref 4.0–10.5)

## 2013-11-30 LAB — COMPREHENSIVE METABOLIC PANEL
ALT: 12 U/L (ref 0–53)
AST: 12 U/L (ref 0–37)
Albumin: 4.5 g/dL (ref 3.5–5.2)
Alkaline Phosphatase: 84 U/L (ref 39–117)
BUN: 7 mg/dL (ref 6–23)
CO2: 28 mEq/L (ref 19–32)
Calcium: 9 mg/dL (ref 8.4–10.5)
Chloride: 102 mEq/L (ref 96–112)
Creat: 0.76 mg/dL (ref 0.50–1.35)
Glucose, Bld: 87 mg/dL (ref 70–99)
Potassium: 4.1 mEq/L (ref 3.5–5.3)
Sodium: 138 mEq/L (ref 135–145)
Total Bilirubin: 0.3 mg/dL (ref 0.2–1.2)
Total Protein: 6.8 g/dL (ref 6.0–8.3)

## 2013-11-30 MED ORDER — PHENOBARBITAL 64.8 MG PO TABS: ORAL_TABLET | ORAL | Status: DC

## 2013-11-30 NOTE — Patient Instructions (Signed)
It was a pleasure to see you today.  We are checking your level of phenobarbital today, as well as your kidney and liver tests.  We will call you at [phone] to report the results and adjust the medicine as needed.  If the level is acceptable, we may consider adding back another medicine for seizure (topiramate) that you had been on in the past.   I ask that you make a follow up appointment with Dr Ree Kida in the coming 2 weeks.
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
## 2013-12-01 NOTE — Assessment & Plan Note (Signed)
Patient with recurrent sz activity; to check phenobarbital level and adjust dose accordingly,   If therapeutic, consideration of addition of Topamax as second agent (appears to have been on this in the past, per Neuro notes March 2013).  Would also encourage return to Neurology for outpatient follow up.  Sleep deprivation also a possible trigger for recurrent seizure. JB

## 2013-12-02 LAB — PHENOBARBITAL LEVEL: Phenobarbital: 22.3 mg/L (ref 15.0–40.0)

## 2013-12-03 ENCOUNTER — Telehealth: Payer: Self-pay | Admitting: Family Medicine

## 2013-12-03 MED ORDER — TOPIRAMATE 25 MG PO TABS: ORAL_TABLET | ORAL | Status: DC

## 2013-12-03 NOTE — Telephone Encounter (Signed)
Called patient to report lab results; he has a phenobarbital level that is within the therapeutic window.  Old notes from [facility] in March, 2013 show that he had been on Topamax 100mg  at bedtime; he confirms that he is not taking this now and doesn't recall when he last took it.  Will add back, starting with topamax 25mg  bedtime for 1 week, then increase to 50mg /hs and keep follow up appt with Dr Ree Kida on March 31.  If continues to have sz, may want to refer back to [facility] for further eval.  He says he "may have had one more sz" since his visit here on March 17th. JB

## 2013-12-14 ENCOUNTER — Ambulatory Visit (INDEPENDENT_AMBULATORY_CARE_PROVIDER_SITE_OTHER): Payer: Medicare Other | Admitting: Family Medicine

## 2013-12-14 ENCOUNTER — Encounter: Payer: Self-pay | Admitting: Family Medicine

## 2013-12-14 VITALS — BP 139/87 | HR 82 | Ht 69.0 in | Wt 195.3 lb

## 2013-12-14 DIAGNOSIS — M7550 Bursitis of unspecified shoulder: Secondary | ICD-10-CM

## 2013-12-14 DIAGNOSIS — M25519 Pain in unspecified shoulder: Secondary | ICD-10-CM

## 2013-12-14 DIAGNOSIS — IMO0002 Reserved for concepts with insufficient information to code with codable children: Secondary | ICD-10-CM

## 2013-12-14 DIAGNOSIS — R569 Unspecified convulsions: Secondary | ICD-10-CM

## 2013-12-14 DIAGNOSIS — M75100 Unspecified rotator cuff tear or rupture of unspecified shoulder, not specified as traumatic: Secondary | ICD-10-CM

## 2013-12-14 MED ORDER — METHYLPREDNISOLONE ACETATE 80 MG/ML IJ SUSP
80.0000 mg | Freq: Once | INTRAMUSCULAR | Status: AC
  Administered 2013-12-14: 80 mg via INTRA_ARTICULAR

## 2013-12-14 NOTE — Patient Instructions (Addendum)
Seizures - continue Topamax and Phenobarbitol, a referral has been made to Neurology, you will receive a call from our office in regards to this referral in the next few days  Shoulder and Neck Pain - Dr. Ree Kida will attempt to get records from [facility] and Dr. Marlou Sa, will perform steroid injection today, will refer back to Physical Therapy, please continue to complete home exercises and heat/ice daily  Return to see Dr. Ree Kida in 2 weeks.

## 2013-12-15 ENCOUNTER — Telehealth: Payer: Self-pay | Admitting: *Deleted

## 2013-12-15 NOTE — Telephone Encounter (Signed)
Faxed signed ROI to [facility] and also to [facility] [employer]. Scanned into chart. Javier Glazier, Gerrit Heck

## 2013-12-15 NOTE — Assessment & Plan Note (Addendum)
Patient continues to have significant pain and decreased ROM in the left shoulder/left cervical spine, developing left hand weakness/numbness, following with [facility] and Dr. Marlou Sa ([place]) -Attempt to obtain previous records (including MRI left shoulder and nerve conduction study), consider MRI cervical spine based on results of nerve conduction study -Subacromial injection performed today as previously helpful (last completed 6 months ago per patient record) -Patient again referred to PT to help with ROM (patien developing frozen shoulder) -If no improvement will refer patient to Sports Medicine for Glenohumeral Injection (treatment of frozen shoulder) -Return to office in 4 weeks for follow up.

## 2013-12-15 NOTE — Telephone Encounter (Signed)
Pt walked into clinic to request referral for PT and print out.  I entered the PT order and gave pt the print out. He will take it to the [facility] today.  Also, pt complained of increased pain in left shoulder after injection yesterday. I checked pt's left shoulder and injection site. No redness, no swelling. Will fwd to Dr.Fletke for review. Thanks. Javier Glazier, Gerrit Heck

## 2013-12-15 NOTE — Progress Notes (Addendum)
Subjective:    Patient ID: Richard Glass, male    DOB: [birth year], 43 y.o.   MRN: [id number]  HPI 43 y/o male presents for follow up of seizures and left shoulder/neck pain.   Seizures - patient was recently evaluated in office for recurrence of seizures, he reported multiple tonic clonic seizures prior to the previous visit with Dr. Lindell Noe, patient has continued Phenobarbital, denies missed doses, he has also started on Topamax at last visit. The last seizure that he had occurred after he left the office on 11/30/13. He was with his mother at the time who told him that he had generalized convulsions that lasted 1-2 minutes, short postictal period, patient reports no seizure activity since, his seizures are related to a TBI that he had as a child, he currently does not follow with a Neurologist, he is also on Xanax for anxiety however reports no missed doses  Left shoulder/neck pain - patient has longstanding history (greater than one year) of left arm and neck pain, he has been following at [facility], had MRI Left shoulder in 2014 (no available for review in Epic), subsequently underwent shoulder arthroscopy which provided relief for a short period of time. He reports having a nerve conduction study performed last year (unable to view in EPIC), he is uncertain of the results of this testing, he was recently referred to a Dr. Marlou Sa at [facility], per the patient no additional therapy was provided, he has had multiple steroid injections in the past as well as PT which provided some relief of the pain, he reports worsening of the pain in his left shoulder and next over the past weeks to months, he has left hand numbness and subjective weakness, he applies heat and ice intermittently, takes Vicodin daily for a history of back pain  Social: former smoker   Review of Systems  Constitutional: Negative for fever, chills and fatigue.  Respiratory: Negative for shortness of breath.     Cardiovascular: Negative for chest pain.  Gastrointestinal: Negative for nausea, diarrhea and constipation.  Musculoskeletal: Positive for arthralgias, back pain, myalgias and neck stiffness.  Neurological: Positive for seizures and weakness. Negative for tremors, syncope and speech difficulty.   MRI C-spine (2007) was negative for acute pathology    Objective:   Physical Exam Vitals: reviewed Gen: pleasant male, NAD HEENT: normocephalic, PERRL, EOMI, no scleral icterus, nasal septum midline, no rhinorrhea, MMM, uvula midline, no pharyngeal erythema or exudate noted, neck supple, no thyromegally Cardiac: RRR, S1 and S2 present, no murmurs, no heaves/thrills Resp: CTAB, normal effort Neuro: CN2-12 intact, strength 5/5 in all extremities (except grip strength 4/5 left UE due to pain), sensation to light touch grossly intact, 2+ patellar/achilles/biceps/brachialradialis reflexes, pronator drip negative, rhomberg negative MSK: diffuse muscle tenderness of the cervical spine/left trapezius/left deltoid, minimal midline cervical tenderness, Left UE - no swelling or erythema present, Abduction 100 degrees (120 degrees with provider help), Forward flexion 130 degrees (145 with provider help), only able to reach to back pocket on left (T10 on right side) empty can strength 5/5, IR/ER strength 5/5, modified lift off test negative, positive Neers and Hawkings testing  Phenobarbital level within therapeutic range.  Procedure Note: Subacromial Bursa Injection - Written consent was obtained after the risks and benefits of the procedure were discussed, the injection site was prepped in a sterile fashion using Betadine, using a 25 gauge needle and 10 cc syringe 5 cc of 1% Lidocaine without Epinephrine and 40 mg DepoMedrol was  injected into the [place]. Patient tolerated the procedure well. No bleeding. Bandaid applied. No complications were encountered.      Assessment & Plan:  Please see problem  specific assessment and plan.

## 2013-12-15 NOTE — Assessment & Plan Note (Signed)
Patient is seizure free since adding Topamax to current regimen of Phenobarbital. Unclear reason for recurrence of seizures as previously well controlled. -Continue current regimen -patient to be referred back to Neurology.

## 2013-12-20 ENCOUNTER — Encounter: Payer: Self-pay | Admitting: Family Medicine

## 2013-12-20 NOTE — Progress Notes (Signed)
Patient ID: Richard Glass, male   DOB: Nov 05, 1970, 43 y.o.   MRN: [id number]  Reviewed notes from [facility] [employer]. Updated Overview section for left shoulder bursitis.   Dossie Arbour MD

## 2013-12-21 ENCOUNTER — Ambulatory Visit: Payer: Medicare Other | Attending: Family Medicine | Admitting: Physical Therapy

## 2013-12-21 DIAGNOSIS — IMO0001 Reserved for inherently not codable concepts without codable children: Secondary | ICD-10-CM | POA: Insufficient documentation

## 2013-12-21 DIAGNOSIS — M2550 Pain in unspecified joint: Secondary | ICD-10-CM | POA: Insufficient documentation

## 2013-12-21 DIAGNOSIS — M2560 Stiffness of unspecified joint, not elsewhere classified: Secondary | ICD-10-CM | POA: Insufficient documentation

## 2013-12-23 ENCOUNTER — Ambulatory Visit (INDEPENDENT_AMBULATORY_CARE_PROVIDER_SITE_OTHER): Payer: Medicare Other | Admitting: Neurology

## 2013-12-23 ENCOUNTER — Encounter: Payer: Self-pay | Admitting: Neurology

## 2013-12-23 VITALS — BP 122/83 | HR 80 | Ht 69.0 in | Wt 195.0 lb

## 2013-12-23 DIAGNOSIS — R569 Unspecified convulsions: Secondary | ICD-10-CM

## 2013-12-23 MED ORDER — TOPIRAMATE 50 MG PO TABS: ORAL_TABLET | ORAL | Status: DC

## 2013-12-23 NOTE — Patient Instructions (Signed)
Topamax (topiramate) is a seizure medication that has an FDA approval for seizures and for migraine headache. Potential side effects of this medication include weight loss, cognitive slowing, tingling in the fingers and toes, and carbonated drinks will taste bad. If any significant side effects are noted on this drug, please contact our office.   With the Topamax, take 50 mg twice a day for 2 weeks, then take 50 mg in the morning and 100 mg in the evening.    Epilepsy Epilepsy is a disorder in which a person has repeated seizures over time. A seizure is a release of abnormal electrical activity in the brain. Seizures can cause a change in attention, behavior, or the ability to remain awake and alert (altered mental status). Seizures often involve uncontrollable shaking (convulsions).  Most people with epilepsy lead normal lives. However, people with epilepsy are at an increased risk of falls, accidents, and injuries. Therefore, it is important to begin treatment right away. CAUSES  Epilepsy has many possible causes. Anything that disturbs the normal pattern of brain cell activity can lead to seizures. This may include:   Head injury.  Birth trauma.  High fever as a child.  Stroke.  Bleeding into or around the brain.  Certain drugs.  Prolonged low oxygen, such as what occurs after CPR efforts.  Abnormal brain development.  Certain illnesses, such as meningitis, encephalitis (brain infection), malaria, and other infections.  An imbalance of nerve signaling chemicals (neurotransmitters).  SIGNS AND SYMPTOMS  The symptoms of a seizure can vary greatly from one person to another. Right before a seizure, you may have a warning (aura) that a seizure is about to occur. An aura may include the following symptoms:  Fear or anxiety.  Nausea.  Feeling like the room is spinning (vertigo).  Vision changes, such as seeing flashing lights or spots. Common symptoms during a seizure  include:  Abnormal sensations, such as an abnormal smell or a bitter taste in the mouth.   Sudden, general body stiffness.   Convulsions that involve rhythmic jerking of the face, arm, or leg on one or both sides.   Sudden change in consciousness.   Appearing to be awake but not responding.   Appearing to be asleep but cannot be awakened.   Grimacing, chewing, lip smacking, drooling, tongue biting, or loss of bowel or bladder control. After a seizure, you may feel sleepy for a while. DIAGNOSIS  Your health care provider will ask about your symptoms and take a medical history. Descriptions from any witnesses to your seizures will be very helpful in the diagnosis. A physical exam, including a detailed neurological exam, is necessary. Various tests may be done, such as:   An electroencephalogram (EEG). This is a painless test of your brain waves. In this test, a diagram is created of your brain waves. These diagrams can be interpreted by a specialist.  An MRI of the brain.   A CT scan of the brain.   A spinal tap (lumbar puncture, LP).  Blood tests to check for signs of infection or abnormal blood chemistry. TREATMENT  There is no cure for epilepsy, but it is generally treatable. Once epilepsy is diagnosed, it is important to begin treatment as soon as possible. For most people with epilepsy, seizures can be controlled with medicines. The following may also be used:  A pacemaker for the brain (vagus nerve stimulator) can be used for people with seizures that are not well controlled by medicine.  Surgery on  the brain. For some people, epilepsy eventually goes away. HOME CARE INSTRUCTIONS   Follow your health care provider's recommendations on driving and safety in normal activities.  Get enough rest. Lack of sleep can cause seizures.  Only take over-the-counter or prescription medicines as directed by your health care provider. Take any prescribed medicine exactly as  directed.  Avoid any known triggers of your seizures.  Keep a seizure diary. Record what you recall about any seizure, especially any possible trigger.   Make sure the people you live and work with know that you are prone to seizures. They should receive instructions on how to help you. In general, a witness to a seizure should:   Cushion your head and body.   Turn you on your side.   Avoid unnecessarily restraining you.   Not place anything inside your mouth.   Call for emergency medical help if there is any question about what has occurred.   Follow up with your health care provider as directed. You may need regular blood tests to monitor the levels of your medicine.  SEEK MEDICAL CARE IF:   You develop signs of infection or other illness. This might increase the risk of a seizure.   You seem to be having more frequent seizures.   Your seizure pattern is changing.  SEEK IMMEDIATE MEDICAL CARE IF:   You have a seizure that does not stop after a few moments.   You have a seizure that causes any difficulty in breathing.   You have a seizure that results in a very severe headache.   You have a seizure that leaves you with the inability to speak or use a part of your body.  Document Released: 09/02/2005 Document Revised: 06/23/2013 Document Reviewed: 04/14/2013 [facility] Patient Information 2014 [place].
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
## 2013-12-27 ENCOUNTER — Ambulatory Visit: Payer: Medicare Other | Admitting: Rehabilitation

## 2013-12-29 ENCOUNTER — Ambulatory Visit: Payer: Medicare Other | Admitting: Rehabilitation

## 2014-01-03 ENCOUNTER — Ambulatory Visit (INDEPENDENT_AMBULATORY_CARE_PROVIDER_SITE_OTHER): Payer: Medicare Other | Admitting: Family Medicine

## 2014-01-03 ENCOUNTER — Encounter: Payer: Self-pay | Admitting: Family Medicine

## 2014-01-03 ENCOUNTER — Encounter: Payer: Medicare Other | Admitting: Rehabilitation

## 2014-01-03 VITALS — BP 128/79 | HR 86 | Ht 69.0 in | Wt 193.0 lb

## 2014-01-03 DIAGNOSIS — M75100 Unspecified rotator cuff tear or rupture of unspecified shoulder, not specified as traumatic: Secondary | ICD-10-CM

## 2014-01-03 DIAGNOSIS — M7550 Bursitis of unspecified shoulder: Secondary | ICD-10-CM

## 2014-01-03 DIAGNOSIS — IMO0002 Reserved for concepts with insufficient information to code with codable children: Secondary | ICD-10-CM

## 2014-01-03 DIAGNOSIS — F411 Generalized anxiety disorder: Secondary | ICD-10-CM

## 2014-01-03 MED ORDER — HYDROCODONE-ACETAMINOPHEN 5-325 MG PO TABS: 1.0000 | ORAL_TABLET | Freq: Four times a day (QID) | ORAL | Status: DC | PRN

## 2014-01-03 MED ORDER — ALPRAZOLAM 0.5 MG PO TABS: 0.5000 mg | ORAL_TABLET | Freq: Two times a day (BID) | ORAL | Status: DC | PRN

## 2014-01-03 NOTE — Patient Instructions (Signed)
Shoulder pain - return to see Dr. Marlou Sa, continue Physical Therapy as I think that you are having improved range of motion with therapy, continue home exercise routine and ice/heat to the area  Discuss the option of an Ultrasound guided glenohumeral joint injection to help relieve frozen shoulder.

## 2014-01-04 ENCOUNTER — Ambulatory Visit: Payer: Medicare Other | Admitting: Rehabilitation

## 2014-01-04 NOTE — Assessment & Plan Note (Signed)
Reviewed previous records. Previous surgeries and imaging studies outlined in the overview section. Patient had minimal relief of symptoms with subacromial injection and physical therapy since last visit. -At this time I've no additional therapies to offer to the patient -We discussed referral to sports medicine versus return to [place], patient elected to return to [place] [facility] at this time -I have counseled the patient to discuss the option of a glenohumeral joint injection with the [facility] orthopedic surgeon in order to treat the patient's frozen shoulder -Patient to continue physical therapy, home exercise routine, pain medications as needed, and icing

## 2014-01-04 NOTE — Progress Notes (Signed)
   Subjective:    Patient ID: Richard Glass, male    DOB: [birth year], 43 y.o.   MRN: [id number]  HPI 43 year old male presents for followup of multiple medical issues.  Anxiety-well controlled with as needed Xanax, no recent panic attacks  Left shoulder pain - patient has history of subacromial bursitis, he has been treated with surgical intervention as well as steroid injections as outlined in the problem list, patient was recently seen and evaluated by Dr. Marlou Sa at [facility] who discussed the risks and benefits of surgical intervention with the patient, it was decided to not pursue surgical intervention at that time, patient was seen at family practice last month and provided with subacromial steroid injection, patient reports pain relief for 2-3 days, he has been going to physical therapy 2 times weekly as well as performing home exercises, he is icing daily, he states that he has had minimal relief of symptoms with these interventions, pain is most improved with Norco as needed, he takes an average of 3-1/2 tablets per day, pain at its best is 5/10, at its worst it is 7-8/10, patient is becoming frustrated that he is not had significant relief of his symptoms   Review of Systems  Constitutional: Negative for fever, chills and fatigue.  Respiratory: Negative for chest tightness and shortness of breath.   Gastrointestinal: Negative for nausea and diarrhea.  Musculoskeletal: Positive for arthralgias and back pain.       Objective:   Physical Exam Vitals: Reviewed General: Pleasant male, no acute distress Cardiac: Regular in rhythm, S1 and S2 present, no murmurs, no heaves or thrills Respiratory: Clear to auscultation bilaterally, normal effort MSK: Patient able to forward flex his arm to approximately 135, able to abduct to approximately 110, with help from physicians he is able to add approximately 10 more degrees of forward flexion and abduction before the movement is  limited by pain, Hawkins and Neer's test are both positive, patient has some discomfort in his biceps with speed's testing, rotator cuff testing not repeated as done at previous exam      Assessment & Plan:  Please see problem specific assessment and plan.

## 2014-01-04 NOTE — Assessment & Plan Note (Signed)
Stable on current dose of Xanax. Refills provided.

## 2014-01-05 ENCOUNTER — Ambulatory Visit: Payer: Medicare Other | Admitting: Rehabilitation

## 2014-01-10 ENCOUNTER — Ambulatory Visit: Payer: Medicare Other

## 2014-01-13 ENCOUNTER — Ambulatory Visit: Payer: Medicare Other | Admitting: Physical Therapy

## 2014-01-25 ENCOUNTER — Other Ambulatory Visit: Payer: Self-pay | Admitting: Family Medicine

## 2014-02-04 ENCOUNTER — Encounter: Payer: Self-pay | Admitting: Family Medicine

## 2014-02-04 ENCOUNTER — Ambulatory Visit (INDEPENDENT_AMBULATORY_CARE_PROVIDER_SITE_OTHER): Payer: Medicare Other | Admitting: Family Medicine

## 2014-02-04 VITALS — BP 128/77 | HR 70 | Temp 98.0°F | Ht 69.0 in | Wt 192.0 lb

## 2014-02-04 DIAGNOSIS — J029 Acute pharyngitis, unspecified: Secondary | ICD-10-CM

## 2014-02-04 DIAGNOSIS — F411 Generalized anxiety disorder: Secondary | ICD-10-CM

## 2014-02-04 DIAGNOSIS — R569 Unspecified convulsions: Secondary | ICD-10-CM

## 2014-02-04 DIAGNOSIS — IMO0002 Reserved for concepts with insufficient information to code with codable children: Secondary | ICD-10-CM

## 2014-02-04 DIAGNOSIS — E785 Hyperlipidemia, unspecified: Secondary | ICD-10-CM

## 2014-02-04 DIAGNOSIS — M75100 Unspecified rotator cuff tear or rupture of unspecified shoulder, not specified as traumatic: Secondary | ICD-10-CM

## 2014-02-04 DIAGNOSIS — M7550 Bursitis of unspecified shoulder: Secondary | ICD-10-CM

## 2014-02-04 MED ORDER — ALPRAZOLAM 0.5 MG PO TABS: 0.5000 mg | ORAL_TABLET | Freq: Two times a day (BID) | ORAL | Status: DC | PRN

## 2014-02-04 MED ORDER — HYDROCODONE-ACETAMINOPHEN 5-325 MG PO TABS: 1.0000 | ORAL_TABLET | Freq: Four times a day (QID) | ORAL | Status: DC | PRN

## 2014-02-04 NOTE — Patient Instructions (Signed)
Shoulder pain - continue Norco as needed and home physical therapy  Anxiety - refill of Xanax   Sore throat - suspect due to viral illness/allergies, continue Flonase and Zyrtec, salt water gargles and cough drops as needed, avoid Tylenol as your are already getting this in your Norco  High Cholesterol - very well controlled, stop Simvastatin as we discussed

## 2014-02-05 DIAGNOSIS — J029 Acute pharyngitis, unspecified: Secondary | ICD-10-CM | POA: Insufficient documentation

## 2014-02-05 NOTE — Assessment & Plan Note (Signed)
Stable on current dose of Xanx. No signs of depression. Patient not interested in trial of SSRI at this time. -Refills provided

## 2014-02-05 NOTE — Assessment & Plan Note (Signed)
Sore throat likely due to viral illness -discussed conservative measures including salt water gargles and throat lozenges.

## 2014-02-05 NOTE — Assessment & Plan Note (Signed)
Patient is seizure free on Phenobarbitol and Topmax.

## 2014-02-05 NOTE — Assessment & Plan Note (Signed)
ROM improved on exam today. Patient still requires Norco for pain.  -Refills provided

## 2014-02-05 NOTE — Assessment & Plan Note (Signed)
After a discussion about the risks and benefits of continuing a Statin the patient elected to discontinue Simvastatin.

## 2014-02-05 NOTE — Progress Notes (Signed)
   Subjective:    Patient ID: Richard Glass, male    DOB: [birth year], 43 y.o.   MRN: [id number]  HPI 43 y/o male presents for routine follow up.  Anxiety - needs refill on Xanax, takes twice daily, no recent panic attacks, has never been on SSRI, denies depression/anhedonia/poor concentration/HI/SI. He does report trouble sleeping however this is related to the pain in his shoulder.  Left shoulder pain/Subacromial Bursitis - recently seen by Dr. Charletta Cousin and received Glenohumeral injection (per patient record, no note in EPIC), had some relief of symptoms for a few days, has been going to PT when he can afford to go, completes home exercises daily, taking 3-4 Norco per day   Seizures - no recent seizure activity, taking Phenobarbital and Topamax daily  Sore throat - mild sore throat over the past few day, associated cough and congestion, no fevers, no sick contacts, has been taking Tylenol for pain control  Reviewed social history with patient.    Review of Systems  Constitutional: Negative for fever, chills and fatigue.  HENT: Positive for congestion, rhinorrhea and sore throat.   Eyes: Positive for itching.  Respiratory: Positive for cough. Negative for choking and chest tightness.   Cardiovascular: Negative for chest pain.  Gastrointestinal: Negative for nausea, vomiting and diarrhea.       Objective:   Physical Exam  Constitutional: Vital signs are normal. He appears well-developed and well-nourished.  HENT:  Head: Normocephalic and atraumatic.  Right Ear: Tympanic membrane normal.  Left Ear: Tympanic membrane normal.  Nose: Nose normal. No rhinorrhea.  Mouth/Throat: Uvula is midline and mucous membranes are normal.  Erythema of pharynx noted  Eyes: Conjunctivae and EOM are normal. Pupils are equal, round, and reactive to light.  Neck: Neck supple.  Cardiovascular: Normal rate, regular rhythm, S1 normal, S2 normal and normal heart sounds.   No murmur heard. Pulmonary/Chest:  Effort normal and breath sounds normal.  Abdominal: Normal appearance and bowel sounds are normal. There is no tenderness.  Psychiatric: His speech is normal and behavior is normal. Thought content normal. His affect is blunt. Cognition and memory are normal.  MSK: ROM of left shoulder improved (able to abduct to 145 degrees, forward flexion of 145 degrees), Hawkings and Neers positive      Assessment & Plan:  Please see problem specific assessment and plan.

## 2014-02-14 ENCOUNTER — Ambulatory Visit (INDEPENDENT_AMBULATORY_CARE_PROVIDER_SITE_OTHER): Payer: Medicare Other | Admitting: Family Medicine

## 2014-02-14 ENCOUNTER — Encounter: Payer: Self-pay | Admitting: Family Medicine

## 2014-02-14 VITALS — BP 128/85 | HR 61 | Temp 98.2°F | Ht 69.0 in | Wt 194.8 lb

## 2014-02-14 DIAGNOSIS — L255 Unspecified contact dermatitis due to plants, except food: Secondary | ICD-10-CM

## 2014-02-14 DIAGNOSIS — L237 Allergic contact dermatitis due to plants, except food: Secondary | ICD-10-CM

## 2014-02-14 DIAGNOSIS — J029 Acute pharyngitis, unspecified: Secondary | ICD-10-CM

## 2014-02-14 MED ORDER — DIPHENHYDRAMINE HCL 2 % EX GEL: CUTANEOUS | Status: DC

## 2014-02-14 NOTE — Assessment & Plan Note (Signed)
Related to seasonal allergies. -Instructed to use daily Zyrtec and Benadryl at night as needed -avoid exposure/wear mask when cutting grass -may use otc eye drops -Stop Flonase for 3 days given longstanding use and blood nose

## 2014-02-14 NOTE — Progress Notes (Signed)
   Subjective:    Patient ID: Richard Glass, male    DOB: 01-06-1971, 43 y.o.   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  HENT: Positive for congestion, postnasal drip, rhinorrhea, sinus pressure and sneezing.   Respiratory: Positive for cough. Negative for shortness of breath.   Cardiovascular: Negative for chest pain.  Gastrointestinal: Negative for nausea, diarrhea and constipation.       Objective:   Physical Exam Vitals: reviewed Gen: pleasant male, NAD HEENT: scars on head from previous injury, bilateral TM's pearly grey, PERRL, EOMI, no scleral icterus, rhinorrhea present, mild dried blood in left nostril, MMM, pharyngeal erythema present, no exudate, MMM, no cervical lymphadenopathy Cardiac: RRR, S1 and S2 present, no murmurs Resp: CTAB, normal effort Skin: dry erythematous plaques on lower legs consistent with poison ivy allergic dermatitis     Assessment & Plan:  Please see problem specific assessment and plan.

## 2014-02-14 NOTE — Patient Instructions (Signed)
Sore throat/runny nose - I think this is due to allergies, continue daily Zyrtec, stop Flonase for 3 days to let your nose heal, may use oral Benadryl at night to help relieve your symptoms, may use over the counter Visine antihistamine drops, wear face mask wild weedeating  Rash - continue to apply Calamine lotion to the affected areas, a prescription of Benadryl cream has also been sent to your pharmacy

## 2014-02-14 NOTE — Assessment & Plan Note (Signed)
Allergic Dermatitis of lower legs from poison ivy -continue calamine lotion -prescription for Benadryl cream provided -No severe enough for steroids at this time

## 2014-02-15 ENCOUNTER — Telehealth: Payer: Self-pay | Admitting: Family Medicine

## 2014-03-09 NOTE — Telephone Encounter (Signed)
Called to setup appointment for AWV. Schedule Pt a 60 min. Appointment when Pt returns call.

## 2014-03-23 ENCOUNTER — Other Ambulatory Visit: Payer: Self-pay

## 2014-03-23 MED ORDER — TOPIRAMATE 50 MG PO TABS: ORAL_TABLET | ORAL | Status: DC

## 2014-03-23 NOTE — Telephone Encounter (Signed)
Pharmacy requests 90 days  

## 2014-03-24 ENCOUNTER — Other Ambulatory Visit: Payer: Self-pay | Admitting: *Deleted

## 2014-03-25 MED ORDER — FLUTICASONE PROPIONATE 50 MCG/ACT NA SUSP: 2.0000 | Freq: Every day | NASAL | Status: DC

## 2014-04-25 ENCOUNTER — Ambulatory Visit (INDEPENDENT_AMBULATORY_CARE_PROVIDER_SITE_OTHER): Payer: Medicare Other | Admitting: Nurse Practitioner

## 2014-04-25 ENCOUNTER — Encounter: Payer: Self-pay | Admitting: Nurse Practitioner

## 2014-04-25 VITALS — BP 108/73 | HR 66 | Ht 68.0 in | Wt 196.4 lb

## 2014-04-25 DIAGNOSIS — R569 Unspecified convulsions: Secondary | ICD-10-CM

## 2014-04-25 MED ORDER — PHENOBARBITAL 64.8 MG PO TABS: ORAL_TABLET | ORAL | Status: DC

## 2014-04-25 MED ORDER — TOPIRAMATE 50 MG PO TABS: ORAL_TABLET | ORAL | Status: DC

## 2014-04-25 NOTE — Patient Instructions (Signed)
Continue on your current dose of Phenobarbital and Topirimate.  Follow up in 6 months, sooner as needed.  Managing Seizure Triggers: Tips for Lifestyle Modification Adapted from the [facility], [facility], [place], [place] and the journal "Clinical Nursing Practice in Epilepsy", Spring 1994.  Developing plans to modify your lifestyle is an important part of seizure preparedness. It's a way that you, as a person with seizures or a parent of a child with seizures, can take charge and play an active role in your epilepsy care. The following tips are examples of what people can do to manage triggers. Some of these tips may require a change in behavior, others may be ways to adjust your environment or schedule so not everything happens at once. Before choosing tips to try, make sure you've assessed your situation and talked to your doctor and other health care professionals for their suggestions too. Please note that research on the effectiveness of many of these techniques is limited. Many of these tips are common sense suggestions or are from health care professionals and people with epilepsy as to what they have seen and tried.  Noises: People who think they are affected by noises should be sure to talk to their doctor about whether they have a form of 'reflex epilepsy' or if general noise or distraction may be a trigger in another way. People with true reflex epilepsy may respond to specific seizure medicines and should talk to their doctor. Try using earplugs or earphones, especially in noisy or crowded places. Try listening to relaxing music or sounds, or try distracting yourself by singing or focusing on another activity.  Bright, flashing or fluorescent lights: Use polarized or tinted glasses. Use natural lighting when indoors. Focus on distant objects when riding in a car to avoid flickering lights or patterns. Avoid discos, strobe lights or  flashing bulbs on holiday decorations. Use computer monitor with minimal contrast glare or use a screen filter. Consult with your doctor about other specific recommendations for computer use.  Sleep: Try to regulate sleeping habits so you have a consistent schedule and get enough sleep. Keep a log or diary of your sleep patterns, seizures and general well-being. Ask a partner or companion to record his or her observations too. Consider the following ideas to improve sleep.  . Discuss your medicine schedule with your doctor or nurse. Changing times or doses at night may help sleep. . Limit caffeine and try to avoid it after noon time or mid?afternoon at the latest. . Avoid alcohol and nicotine prior to sleep. . Limit working or studying late at night. Stop work at least one hour before bedtime to allow time to relax. . Exercise in the early evening if possible. . Take warm showers or have someone give you a back rub before bedtime to decrease muscle tension. . Try relaxation exercises before bedtime. . Limit naps and don't nap in the early evening. . If anxious or worried, talk to someone or write down your feelings before going to sleep. Put this away and deal with these worries or concerns in the morning! . If you can't fall asleep within 15 minutes get up and do something else for 15 minutes. Then go back to bed and try again. Don't toss and turn in bed all night.  Exercise: Regular exercise is good for everyone. Pace your exercise to avoid getting too tired or hyperventilation. Avoid exercising in the middle of the day during hot weather. Ask your doctor  about any specific exercises you may need to avoid.  Hyperventilation: Try relaxation or slow breathing exercises when anxious or if you begin to hyperventilate. Pace your activity and avoid sports that may trigger hyperventilation.  Diet: Regulate meal times and patterns around sleep, activity, and medication schedules.  Usually taking medicines after food or around meals makes it easier to remember them and may lessen any stomach distress from side effects of medicines. Have a well-balanced diet and eat at consistent times to avoid long periods without food. If your appetite is poor, try small frequent meals instead of skipping meals. Avoid foods and drinks that may aggravate seizures. Not everyone is sensitive to foods, but if you are, talk to your doctor about how to modify your diet. If you are following a diet specifically for your epilepsy, be sure to follow the advice of your doctor and nutritionist.  Alcohol/Drugs: Avoid recreational drugs and talk to your doctor about use of alcohol. Avoid alcohol completely if you're going through high-risk times or have recently had surgery. If you choose to drink alcohol, use 'moderation', drink slowly, and have only one or two glasses at a time. Consider carefully what you drink, avoiding 'hard liquor' or mixed drinks that may have high alcohol content. If alcohol and drugs are a problem for you, talk to your doctor and get professional help.  Hormonal changes: Both men and women may notice a cyclical pattern to their seizures. Record seizures on a calendar and track them in relation to any changes in hormones. Women who are having menstrual cycles should track their cycle days. Women who have stopped having their menses should track other symptoms or changes, while women who are pregnant should track their pregnancy too. The use of hormonal medicines, such as contraceptives or birth control pills as well as hormonal replacement therapy, may affect seizures in some women, so record the dates and doses of these medicines.  NOTE: some seizure medicines may interfere with the effectiveness of hormonal contraceptives making unexpected or unplanned pregnancy more likely. Be sure to talk to your doctor about all contraceptive use.When seizures cluster around menses or hormone  changes, women should try to modify their lifestyle so other triggers don't occur during this high-risk time. Some women may use 'as needed' medicines to help treat seizures associated with menses. Note the use of these on your calendars and seizure preparedness plan.  Illness, fever, trauma: Notify your doctor if you become ill, have a fever, injure yourself seriously, or need other medicines such as antibiotics, painkillers, or cold medicines. Some people may notice that certain medicines can trigger seizures or interfere with seizure medicines. Fevers, other illnesses and injuries may also make you more susceptible and you'll need to monitor your seizures carefully. Try to limit other triggers during these times and talk to your doctor about what medicines you can use.  Stress, anxiety, depression: Emotional stress is a common trigger for some people, and stress can be a cause and symptom of mood problems such as anxiety and depression. Track your stress level and mood in relation to your seizures on your diary. During stressful times, consider ways to modify your lifestyle and manage stress better. . Try counseling to help cope with seizures or other problems. . Consider support groups for epilepsy, or groups for stress management, therapy, and other support. . Write down feelings in a diary on a regular basis. It helps you get feelings out, rather than hold them in, and can help you  see the issues more clearly. . Use 'time-out' periods. Just like kids may need a time-out when they are overwhelmed or acting out, so too do adults. Giving yourself a time-out allows you to take a step back from the stressor or situation and think about how best to address it. . Learn relaxation exercises, deep breathing, yoga, or other strategies that help with stress and general well-being. . Tell your doctor and nurse how you feel. The effects of stress can be harmful to your seizures, and your life.  When mood changes last longer than expected, you may need help from a mental health professional too. If you feel emotionally unsafe, call your doctor or go to an emergency room to be evaluated.

## 2014-04-25 NOTE — Progress Notes (Signed)
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 The patient has been treated in the past with phenobarbital and Topamax, but at some point, he stopped the Topamax. The patient does not recall why this occurred. The patient has not been seen through this office since 11/19/2011. The patient indicates that he has done relatively well with the seizures with no seizures until about 6 weeks prior to this evaluation. The patient then began having seizures more frequently. The patient last had a seizure 2 weeks ago. At that time, his primary care physician added Topamax, currently taking 50 mg at night. The patient has not had another seizure since being on Topamax. The patient is still operating a motor vehicle intermittently. The patient indicates that with the seizure, he sometimes will have a warning associated with some dizziness, and then go into a generalized convulsive episode. The patient will bite his tongue, and he generally does not lose control of the bowels or the bladder during a seizure. The patient indicates that there has been no change in the phenobarbital tablets in shape or color prior to the onset of the seizures. The patient still has frequent headaches, and he has a lot of left shoulder pain from arthritis, and he has to take hydrocodone for this. The patient indicates that he does not sleep well at night because of the left shoulder pain. The patient is followed through an orthopedic surgeon.   Update 04/25/14 (LL): Since last visit, patient has tolerated Topamax one 50 mg tablet in the morning and two 50 mg tablets at bedtime without complication. He has continued on  the same phenobarbital dose at bedtime. He denies any seizure activity since the last visit in our office. He has returned to driving. The patient is no longer having headaches. He is having a significant increase in left shoulder pain, which makes it difficult for him to sleep at night. He has had surgery on his shoulder in the past. He had a pain injection in the shoulder a few months ago without any relief. He has no new complaints today.  ROS:  Out of a complete 14 system review of symptoms, the patient complains only of the following symptoms, and all other reviewed systems are negative.  Sleep apnea, restless legs Chronic low back pain  Weakness, seizures  Itching  ALLERGIES: No Known Allergies  HOME MEDICATIONS: Outpatient Prescriptions Prior to Visit  Medication Sig Dispense Refill  . ALPRAZolam (XANAX) 0.5 MG tablet Take 1 tablet (0.5 mg total) by mouth 2 (two) times daily as needed (anxiety).  60 tablet  0  . cetirizine (ZYRTEC) 10 MG tablet Take 10 mg by mouth daily.        . COMBIVENT RESPIMAT 20-100 MCG/ACT AERS respimat INHALE 2 PUFFS INTO THE LUNGS EVERY 6 (SIX) HOURS.  1 Inhaler  5  . DIPHENHYDRAMINE HCL, TOPICAL, (BENADRYL ITCH STOPPING) 2 % GEL Apply topically to affected areas twice daily.  37.5 g  1  . fluticasone (FLONASE) 50 MCG/ACT nasal spray Place 2 sprays into both nostrils daily.  16 g  3  . HYDROcodone-acetaminophen (NORCO/VICODIN) 5-325 MG per tablet Take 1 tablet by mouth every 6 (six) hours as  needed.  90 tablet  0  . Spacer/Aero-Holding Chambers [facility]) MISC Inhale 2 puffs into the lungs every 4 (four) hours as needed. For use with MDI  1 each  2  . PHENobarbital (LUMINAL) 64.8 MG tablet TAKE 3 TABLETS BY MOUTH AT BEDTIME  90 tablet  5  . topiramate (TOPAMAX) 50 MG tablet One twice a day for 2 weeks, then take one tablet in the morning and two tablets in the evening  270 tablet  0   No facility-administered medications prior to visit.    PHYSICAL  EXAM Filed Vitals:   04/25/14 1109  BP: 108/73  Pulse: 66  Height: 5\' 8"  (1.727 m)  Weight: 196 lb 6.4 oz (89.086 kg)   Body mass index is 29.87 kg/(m^2).  Physical Exam  General: The patient is alert and cooperative at the time of the examination.  Skin: No significant peripheral edema is noted.   Neurologic Exam  Mental status: The patient is oriented x 3.  Last visit Mini-Mental status examination showed a total score 28/30. Animal fluency test was 13.  Cranial nerves: Facial symmetry is present. Speech is normal, no aphasia or dysarthria is noted. Extraocular movements are full. Visual fields are full. Pupils are equal, round, and reactive to light. Discs are flat bilaterally.  Motor: The patient has good strength in all 4 extremities, except in left arm, effort is poor due to shoulder pain. There is bilateral hand tremors with outstretched arms, L>R. Sensory examination: Soft touch sensation is symmetric on the face, arms, and legs.  Coordination: The patient has good finger-nose-finger and heel-to-shin bilaterally.  Gait and station: The patient has a normal gait. Tandem gait is slightly unsteady. Romberg is negative. No drift is seen.  Reflexes: Deep tendon reflexes are symmetric.   DIAGNOSTIC DATA (LABS, IMAGING, TESTING) - I reviewed patient records, labs, notes, testing and imaging myself where available.  Lab Results  Component Value Date   WBC 13.1* 11/30/2013   HGB 16.0 11/30/2013   HCT 46.1 11/30/2013   MCV 90.7 11/30/2013   PLT 328 11/30/2013      Component Value Date/Time   NA 138 11/30/2013 1122   K 4.1 11/30/2013 1122   CL 102 11/30/2013 1122   CO2 28 11/30/2013 1122   GLUCOSE 87 11/30/2013 1122   BUN 7 11/30/2013 1122   CREATININE 0.76 11/30/2013 1122   CREATININE 0.77 09/04/2010 2042   CALCIUM 9.0 11/30/2013 1122   PROT 6.8 11/30/2013 1122   ALBUMIN 4.5 11/30/2013 1122   AST 12 11/30/2013 1122   ALT 12 11/30/2013 1122   ALKPHOS 84 11/30/2013 1122   BILITOT 0.3  11/30/2013 1122    ASSESSMENT: 43 y.o. year old male  has a past medical history of Traumatic cerebral hemorrhage (1975); Abnormal head CT (05/2011); Seizures; Asthma; Anxiety; Headache(784.0); Chronic low back pain; Restless legs syndrome (RLS); Dyslipidemia; Degenerative arthritis; and Sinusitis chronic, frontal. here with:  1. History of closed head injury, left temporal and parietal encephalomalacia  2. Seizures  3. Chronic left shoulder pain  The patient was having an exacerbation of his seizures starting 6-8 months ago. The patient had been on Topamax previously, recently put back on this medication. The patient was increased on the dose taking 50 mg in the morning, and 100 mg in the evening at the last visit and has not had any recurrent seizures.  The patient is now operating a motor vehicle, it has been over 6 months since the last seizure.  The  patient was encouraged to see his orthopedist concerning his severe left shoulder pain, as it is interrupting his sleep. The patient will followup in 6 months. The patient is to contact our office if the seizures continue.   Meds ordered this encounter  Medications  . topiramate (TOPAMAX) 50 MG tablet    Sig: Take one tablet in the morning and two tablets in the evening    Dispense:  270 tablet    Refill:  3    Order Specific Question:  Supervising Provider    Answer:  Andrey Spearman R [[id number]]  . PHENobarbital (LUMINAL) 64.8 MG tablet    Sig: TAKE 3 TABLETS BY MOUTH AT BEDTIME    Dispense:  90 tablet    Refill:  5    Order Specific Question:  Supervising Provider    Answer:  Penni Bombard [[id number]]   Return in about 6 months (around 10/26/2014) for seizures.  Rudi Rummage Denham Mose, MSN, FNP-BC, A/GNP-C 04/25/2014, 11:36 AM [facility] [address] [place], [place] [address] [phone]  Note: This document was prepared with digital dictation and possible smart phrase technology. Any transcriptional errors that  result from this process are unintentional.

## 2014-05-02 ENCOUNTER — Encounter: Payer: Self-pay | Admitting: Family Medicine

## 2014-05-02 ENCOUNTER — Ambulatory Visit (INDEPENDENT_AMBULATORY_CARE_PROVIDER_SITE_OTHER): Payer: Medicare Other | Admitting: Family Medicine

## 2014-05-02 VITALS — BP 125/76 | HR 79 | Temp 97.9°F | Ht 68.0 in | Wt 194.3 lb

## 2014-05-02 DIAGNOSIS — F411 Generalized anxiety disorder: Secondary | ICD-10-CM

## 2014-05-02 DIAGNOSIS — M7552 Bursitis of left shoulder: Secondary | ICD-10-CM

## 2014-05-02 DIAGNOSIS — M75100 Unspecified rotator cuff tear or rupture of unspecified shoulder, not specified as traumatic: Secondary | ICD-10-CM

## 2014-05-02 DIAGNOSIS — R569 Unspecified convulsions: Secondary | ICD-10-CM

## 2014-05-02 DIAGNOSIS — IMO0002 Reserved for concepts with insufficient information to code with codable children: Secondary | ICD-10-CM

## 2014-05-02 MED ORDER — FLUTICASONE PROPIONATE 50 MCG/ACT NA SUSP: 2.0000 | Freq: Every day | NASAL | Status: DC

## 2014-05-02 MED ORDER — HYDROCODONE-ACETAMINOPHEN 5-325 MG PO TABS: 1.0000 | ORAL_TABLET | Freq: Four times a day (QID) | ORAL | Status: DC | PRN

## 2014-05-02 MED ORDER — ALPRAZOLAM 0.5 MG PO TABS: 0.5000 mg | ORAL_TABLET | Freq: Two times a day (BID) | ORAL | Status: DC | PRN

## 2014-05-02 NOTE — Patient Instructions (Addendum)
It was nice to see you day.   I have increased your Norco (pain medication) to 120 tablets per month. A 3 month supply has been provided.  Refill of Xanax provided. Will attempt to decrease Xanax to 0.25 mg at next visit in 3 months.  You are due for yearly physical. Please schedule a yearly physical 3 months from now (November).

## 2014-05-02 NOTE — Assessment & Plan Note (Signed)
Stable. No depressive symptoms. -Refill provided -Decrease dose to 0.25 mg at next visit

## 2014-05-02 NOTE — Progress Notes (Signed)
   Subjective:    Patient ID: Richard Glass, male    DOB: 08/01/1971, 43 y.o.   MRN: [id number]  HPI 43 y/o male presents for routine follow up.  Anxiety - controlled with Xanax BID, patient denies depression or anhedonia, reports improved appetite with Xanax, denies panic attacks  Left shoulder pain - patient reports recent worsening of pain at nighttime, currently taking Norco 5-325 mg 3-4 times per day, he is applying heat and ice daily, continues to do home PT exercises, no change in ROM  Seizure Disorder - followed by Neurology, no recent seizures, taking Topamax and  Phenobarbatol  Social - former smoker   Review of Systems  Constitutional: Negative for fever, chills and fatigue.  HENT: Negative for sneezing and sore throat.   Respiratory: Negative for choking and shortness of breath.   Gastrointestinal: Negative for nausea, vomiting and diarrhea.  Musculoskeletal: Positive for arthralgias.  Neurological: Negative for seizures.       Objective:   Physical Exam Vitals: reviewed Gen: pleasant Caucasian male, NAD Cardiac: RRR, S1 and S2 present, no murmurs, no heaves/thrills Resp: CTAB, normal effort MSK : Right shoulder - tender to palpation anteriorly over clavicle/AC joint, Abduction to 90 degrees, forward flexion to 90 degrees, mild swelling, no erythema Neuro: Alert and Oriented X3 Psych: appropriately dressed, mood described as good, no anhedonia, does not appear to be reacting to any internal stimuli, no SI, no HI, no tangential thoughts     Assessment & Plan:  Please see problem specific assessment and plan.

## 2014-05-02 NOTE — Assessment & Plan Note (Signed)
Pain slightly worse at night. Orthopedics not planning on any additional intervention at this time. -Refill for Norco provided (120 tablets/month) -continue home PT/ice

## 2014-05-02 NOTE — Assessment & Plan Note (Signed)
No recent seizures. -continue current dose of Phenobarbital and Topamax.

## 2014-06-21 ENCOUNTER — Encounter: Payer: Self-pay | Admitting: Family Medicine

## 2014-06-21 ENCOUNTER — Ambulatory Visit (INDEPENDENT_AMBULATORY_CARE_PROVIDER_SITE_OTHER): Payer: Medicare Other | Admitting: Family Medicine

## 2014-06-21 VITALS — BP 124/84 | HR 79 | Temp 98.1°F | Ht 69.0 in | Wt 196.2 lb

## 2014-06-21 DIAGNOSIS — M7552 Bursitis of left shoulder: Secondary | ICD-10-CM

## 2014-06-21 DIAGNOSIS — M25512 Pain in left shoulder: Secondary | ICD-10-CM

## 2014-06-21 DIAGNOSIS — M545 Low back pain: Secondary | ICD-10-CM

## 2014-06-21 DIAGNOSIS — M5430 Sciatica, unspecified side: Secondary | ICD-10-CM

## 2014-06-21 DIAGNOSIS — M5440 Lumbago with sciatica, unspecified side: Secondary | ICD-10-CM

## 2014-06-21 DIAGNOSIS — Z0000 Encounter for general adult medical examination without abnormal findings: Secondary | ICD-10-CM

## 2014-06-21 MED ORDER — CYCLOBENZAPRINE HCL 5 MG PO TABS: 5.0000 mg | ORAL_TABLET | Freq: Three times a day (TID) | ORAL | Status: DC | PRN

## 2014-06-21 NOTE — Assessment & Plan Note (Signed)
Patient reports continued left shoulder pain and numbness. Followed by orthopedic surgery. See overview for review of previous workup. He has received subacromial steroid injections in FM office within the past year with little improvement. I discussed with the patient that I have no further options for therapy at this time. -referral placed to Sports Medicine to discuss options for non-surgical managment

## 2014-06-21 NOTE — Progress Notes (Signed)
   Subjective:    Patient ID: Richard Glass, male    DOB: 06-03-1971, 43 y.o.   MRN: [id number]  HPI 43 y/o male presents for yearly physical.  Acute complaints - patient reports one month history of right sided low back pain, no injury or inciting event, pain radiates down right buttock, has history of intermittent low back pain, has attempted heat to the area, also takes daily Norco for history of shoulder pain, no bladder or bowel incontinence, no saddle anesthesia, no weakness, reports intermittent numbness in bilateral legs  Exercise - no formal exercise however if very active at work, he walks/cuts wood on a daily basis  Diet - eats 3 meals per day, minimal fruit intakes, at least 1-2 servings of vegetables per day  Immunizations - up to date on Flu and Tdap  Cancer screening - no previous colonoscopy (no family history of colon cancer), no nocturia/decreased flow/dribbling (no FH of BPH/prostate CA)  Power of attorney - wishes for mother to make medical decisions for him, no formal living will  Social - 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  Respiratory: Negative for cough and shortness of breath.   Cardiovascular: Negative for chest pain and leg swelling.  Gastrointestinal: Negative for nausea, vomiting and diarrhea.  Musculoskeletal: Positive for back pain.  Neurological: Positive for seizures.   No recent seizures Patient reports continued pain and numbness in left shoulder, has not seen orthopedics in a few months, takes Norco daily for pain    Objective:   Physical Exam Vitals: reviewed Gen: pleasant male, NAD HEENT: normocephalic, PERRL, EOMI, no scleral icterus, bilateral TM's pearly grey, nasal septum midline, MMM, mild pharyngeal erythema, neck supple, no anterior or posterior cervical adenopathy, no thyromegaly Cardiac: RRR, S1 and S2 present, no murmurs,  no heaves/thrills Resp: CTAB, normal effort Abd: soft, normal bowel sounds, no organomegally Ext: no edema, 2+ radial pulses MSK: right paraspinal lumbar tenderness, no midline tenderness, SLT negative for radiculopathy bilatearaly Neuro: CN 2-12 intact, strength 5/5 in bilateral lower extremities, 2/2 patellar and achilles reflexes bilaterally, no clonus, sensation to light touch intact (patient feels better on right side)  Reviewed lab work from last 12 months.      Assessment & Plan:  Please see problem specific assessment and plan.

## 2014-06-21 NOTE — Assessment & Plan Note (Signed)
Patient presents for yearly physical. -up to date on immunizations -Not a candidate for colon cancer screening -No urinary/prostate complaints -up to date on routine lab work -Counseled on proper diet and exercise -Information given for POA/Living Will

## 2014-06-21 NOTE — Patient Instructions (Signed)
Back pain - attempt trial of flexeril (muscle relaxant), continue daily heat to the affected  You are up do date on immunizations and cancer screening.  You have been provided information on health care power of attorney.  Return if your back pain does not improve.

## 2014-06-21 NOTE — Assessment & Plan Note (Signed)
Patient reports one month history of right lumbar back pain with radiation to right buttock. No red flag symptoms. Neurologic exam unremarkable. He has a history of intermittent low back pain. -attempt trial of Flexeril -continue heat to affected area and Norco prn pain (takes for shoulder pain) -return in two weeks if symptoms persist -could consider imaging if symptoms persist as last MRI Lumbar Spine was in 2005

## 2014-07-04 ENCOUNTER — Encounter: Payer: Self-pay | Admitting: Family Medicine

## 2014-07-04 ENCOUNTER — Ambulatory Visit (INDEPENDENT_AMBULATORY_CARE_PROVIDER_SITE_OTHER): Payer: Medicare Other | Admitting: Family Medicine

## 2014-07-04 VITALS — BP 138/80 | Ht 69.0 in | Wt 196.0 lb

## 2014-07-04 DIAGNOSIS — M25512 Pain in left shoulder: Secondary | ICD-10-CM

## 2014-07-04 DIAGNOSIS — S4992XA Unspecified injury of left shoulder and upper arm, initial encounter: Secondary | ICD-10-CM

## 2014-07-04 MED ORDER — METHYLPREDNISOLONE ACETATE 40 MG/ML IJ SUSP
40.0000 mg | Freq: Once | INTRAMUSCULAR | Status: AC
  Administered 2014-07-04: 40 mg via INTRA_ARTICULAR

## 2014-07-05 DIAGNOSIS — M25519 Pain in unspecified shoulder: Secondary | ICD-10-CM

## 2014-07-05 HISTORY — DX: Pain in unspecified shoulder: M25.519

## 2014-07-05 NOTE — Assessment & Plan Note (Signed)
Ultrasound today showed no evidence of acute rotator cuff tear. I think his shoulder pain is a combination of adhesive capsulitis of the left shoulder as well as some arthritic problems with the a.c. joint. His a.c. joint shows a lot of arthritic change on ultrasound and we injected that today. I tried discussing adhesive capsulitis with him he seems fairly focus that there is a problem with his rotator cuff and that he needs another surgery. I'll have him come back in 2-3 weeks for further evaluation, possible glenohumeral joint injection to pursue the likely diagnosis of adhesive capsulitis. I'll try again to discuss these issues with him at followup.

## 2014-07-05 NOTE — Progress Notes (Signed)
Patient ID: Richard Glass, male   DOB: [birth year], 43 y.o.   MRN: [id number]  Richard Glass - 43 y.o. male MRN [id number]  Date of birth: 12/17/1970    SUBJECTIVE:     Left shoulder pain. He has some problems with his right shoulder but the issue that he wants addressed today is his left shoulder pain. This has been bothering him for about a year. Particularly has issues with reaching his refrigerator and getting anything out as a cause a sharp anterior shoulder pain. Also has difficulty reaching overhead. He is right-hand dominant. He's never had surgery on his left shoulder has had no injury that he recalls, he has however had rotator cuff surgery on his right shoulder in the past. His pain is 6/10 the left shoulder most of the time. Aching get acutely worse with activity. Somewhat improved with rest. He's had one subacromial bursa injection which helped for 2-3 days. ROS:     He has no numbness in his upper trimmed these her hands. He's noted no swelling of the left shoulder. He has noted stiffness and pain as per history of present illness. He's had no unusual weight change. No fever, sweats, chills.  PERTINENT  PMH / PSH FH / / SH:  Past Medical, Surgical, Social, and Family History Reviewed & Updated in the EMR.  Pertinent findings include:  Reports prior history of right shoulder rotator cuff surgery and also some type of elbow surgery on the right. Seizure disorder Brain surgery history. He is currently on full-time disability and not working.  OBJECTIVE: BP 138/80  Ht 5\' 9"  (1.753 m)  Wt 196 lb (88.905 kg)  BMI 28.93 kg/m2  Physical Exam:  Vital signs are reviewed. GENERAL: Well-developed male no acute distress, overweight. SHOULDERS: Symmetrical. His scapula move in normal function and was symmetry. There is no winging noted. Bilaterally his shoulders have decreased range of motion in forward flexion and abduction. The left shoulder abduction is about 100 in forward flexion is  about 95. Internal rotation is extremely difficult for him he can place his hand at the lateral portion of his hip. The biceps tendon is nontender. The a.c. joint on the left side is somewhat deformed and easily palpable and mildly tender. There is no erythema here. Distally in the left upper showed he is neurovascularly intact. SKIN: No rash, no erythema, no warmth, no skin lesions in the left shoulder area  ULTRASOUND: His rotator cuff looks intact except for one small Area in the very age of the proximal supraspinatus tendon where there is a deficit of tissue. This looks like an old rotator cuff tear that has scarred down. There is no increased Doppler activity here. His subscapularis looks intact and has some minor occasional calcifications throughout. The a.c. joint is quite hypertrophied with some arthritic change. There is no effusion, no mushroom sign  INJECTION: Patient was given informed consent, signed copy in the chart. Appropriate time out was taken. Area prepped and draped in usual sterile fashion. One cc of methylprednisolone 40 mg/ml plus  2 cc of 1% lidocaine without epinephrine was injected into the left acromioclavicular joint using a(n) ultrasound-guided approach. The patient tolerated the procedure well. There were no complications. Post procedure instructions were given.   ASSESSMENT & PLAN:  See problem based charting & AVS for pt instructions.

## 2014-07-29 ENCOUNTER — Ambulatory Visit (INDEPENDENT_AMBULATORY_CARE_PROVIDER_SITE_OTHER): Payer: Medicare Other | Admitting: Family Medicine

## 2014-07-29 ENCOUNTER — Encounter: Payer: Self-pay | Admitting: Family Medicine

## 2014-07-29 VITALS — BP 119/79 | Ht 69.0 in | Wt 193.0 lb

## 2014-07-29 DIAGNOSIS — M7552 Bursitis of left shoulder: Secondary | ICD-10-CM

## 2014-07-29 DIAGNOSIS — M25512 Pain in left shoulder: Secondary | ICD-10-CM

## 2014-07-29 NOTE — Progress Notes (Signed)
   Subjective:    Patient ID: Richard Glass, male    DOB: 1970/11/22, 43 y.o.   MRN: [id number]  HPI Follow-up left shoulder pain. Last office visit we gave him an before meals joint injection. He had significant improvement in his pain for about 2 days and then he seemed to return to his baseline shoulder pain. He would like to have some further evaluation and possible surgical intervention.   Review of Systems No fever, sweats, chills.    Objective:   Physical Exam  Vital signs are reviewed GENERAL: Well-developed male no acute distress SHOULDER: Left. Lateral abduction 110 with some pain. 4 flexion about 120 also painful in the last 10 of the arc. Internal and external rotation is normal and strength. Before meals joint is mildly tender to palpation. There's no erythema noted over the before meals joint.      Assessment & Plan:

## 2014-07-29 NOTE — Assessment & Plan Note (Signed)
I think the only way we are going to get this resolved for him is to repeat his shoulder MRI which we have set up. I'll see him after that.

## 2014-08-02 ENCOUNTER — Other Ambulatory Visit: Payer: Self-pay | Admitting: Family Medicine

## 2014-08-02 NOTE — Telephone Encounter (Signed)
Needs refills on hydrocodone and aplprazalom Please call when they are ready for pickup

## 2014-08-03 MED ORDER — HYDROCODONE-ACETAMINOPHEN 5-325 MG PO TABS: 1.0000 | ORAL_TABLET | Freq: Four times a day (QID) | ORAL | Status: DC | PRN

## 2014-08-03 MED ORDER — ALPRAZOLAM 0.5 MG PO TABS: 0.5000 mg | ORAL_TABLET | Freq: Two times a day (BID) | ORAL | Status: DC | PRN

## 2014-08-03 NOTE — Telephone Encounter (Signed)
Left message on voicemail for patient to all back, please inform him of message from MD when he calls. Thanks!

## 2014-08-03 NOTE — Telephone Encounter (Signed)
Note to nursing staff - refills printed, placed in front office, please call patient for pickup

## 2014-08-06 ENCOUNTER — Ambulatory Visit
Admission: RE | Admit: 2014-08-06 | Discharge: 2014-08-06 | Disposition: A | Discharge status: Home / Self Care | Payer: Medicare Other | Source: Ambulatory Visit | Attending: Family Medicine | Admitting: Family Medicine

## 2014-08-06 DIAGNOSIS — M25512 Pain in left shoulder: Secondary | ICD-10-CM

## 2014-08-15 ENCOUNTER — Ambulatory Visit (INDEPENDENT_AMBULATORY_CARE_PROVIDER_SITE_OTHER): Payer: Medicare Other | Admitting: Family Medicine

## 2014-08-15 ENCOUNTER — Encounter: Payer: Self-pay | Admitting: Family Medicine

## 2014-08-15 VITALS — BP 145/75 | HR 74 | Temp 97.9°F | Ht 69.0 in | Wt 192.9 lb

## 2014-08-15 DIAGNOSIS — R195 Other fecal abnormalities: Secondary | ICD-10-CM

## 2014-08-15 DIAGNOSIS — M5430 Sciatica, unspecified side: Secondary | ICD-10-CM

## 2014-08-15 DIAGNOSIS — M5440 Lumbago with sciatica, unspecified side: Secondary | ICD-10-CM

## 2014-08-15 DIAGNOSIS — M545 Low back pain: Secondary | ICD-10-CM

## 2014-08-15 MED ORDER — CYCLOBENZAPRINE HCL 5 MG PO TABS: 5.0000 mg | ORAL_TABLET | Freq: Three times a day (TID) | ORAL | Status: DC | PRN

## 2014-08-15 NOTE — Progress Notes (Signed)
   Subjective:    Patient ID: Richard Glass, male    DOB: 1970-11-19, 43 y.o.   MRN: [id number]  HPI 43 y/o male presents for follow up of back pain  Back pain - patient was seen in early October and had right sided back pain with sciatica, this pain has resolved after use of Flexeril, however now has left sided back pain with radiation of pain down the back of left leg, also reports numbness/tingling if left toes, no bladder/bowel incontinence, no saddle anesthesia, no weakness  Loose Stools - patient reports intermittent loose stools for the past month, 2-3 per day for a few days ata time, resolve spontaneously and will go about a week without symptoms, no blood in stool, no abdominal pain, no constipation, no nausea/emesis  Social - patient lives with mother, not a smoker   Review of Systems No fevers/chills See HPI    Objective:   Physical Exam Vitals: reviewed Gen: pleasant male, NAD Abd: soft, no tenderness, hyperactive bowel sounds, no rebound, no guarding MSK: bilateral lumber pain/muscle spasm (midline and paraspinal), SLT positive for pain/sciatica in left leg with lifting of bilateral legs Neuro: strength 5/5 bilateral hip flexion/knee flexion and extension/foot dorsi- and plantarflexion, decreased sensation to light touch in left toes, patellar and achilles reflexes 2+ bilaterally     Assessment & Plan:  Please see problem specific assessment and plan.

## 2014-08-15 NOTE — Assessment & Plan Note (Signed)
Right sided back pain persists however no longer having right sided sciatica. However, now reports 2-3 week history of left sided back pain with sciatica. No red flag symptoms. SLT positive for radiculopathy - refill of flexeril provided -discussed possible MRI of lumber spine, patient is not interested in spinal surgery at this time and elected to hold off on MRI, however if symptoms persist will consider MRI lumber spine

## 2014-08-15 NOTE — Assessment & Plan Note (Signed)
Intermittent loose stools. Asymptomatic currently. Abdominal exam unremarkable.  -will monitor at this time -patient to return if symptoms persist

## 2014-08-15 NOTE — Patient Instructions (Signed)
Back Pain - attempt trial of Flexeril (muscle relaxant), if pain persists please return and we will consider an MRI of your back  Diarrhea/loose stools - monitor for now, please let Dr. Ree Kida know if your symptoms worsen

## 2014-08-19 ENCOUNTER — Encounter: Payer: Self-pay | Admitting: Family Medicine

## 2014-09-04 ENCOUNTER — Other Ambulatory Visit: Payer: Self-pay | Admitting: Family Medicine

## 2014-10-10 ENCOUNTER — Ambulatory Visit: Payer: Self-pay | Admitting: Family Medicine

## 2014-10-17 ENCOUNTER — Ambulatory Visit (INDEPENDENT_AMBULATORY_CARE_PROVIDER_SITE_OTHER): Payer: Medicare Other | Admitting: Family Medicine

## 2014-10-17 ENCOUNTER — Encounter: Payer: Self-pay | Admitting: Family Medicine

## 2014-10-17 VITALS — BP 118/77 | HR 66 | Temp 97.9°F | Ht 69.0 in | Wt 188.4 lb

## 2014-10-17 DIAGNOSIS — M5430 Sciatica, unspecified side: Secondary | ICD-10-CM

## 2014-10-17 DIAGNOSIS — F411 Generalized anxiety disorder: Secondary | ICD-10-CM | POA: Diagnosis not present

## 2014-10-17 DIAGNOSIS — Z7189 Other specified counseling: Secondary | ICD-10-CM | POA: Diagnosis not present

## 2014-10-17 DIAGNOSIS — M5440 Lumbago with sciatica, unspecified side: Secondary | ICD-10-CM

## 2014-10-17 DIAGNOSIS — G8929 Other chronic pain: Secondary | ICD-10-CM

## 2014-10-17 DIAGNOSIS — M545 Low back pain: Secondary | ICD-10-CM | POA: Diagnosis not present

## 2014-10-17 MED ORDER — ALPRAZOLAM 0.5 MG PO TABS: 0.5000 mg | ORAL_TABLET | Freq: Two times a day (BID) | ORAL | Status: DC | PRN

## 2014-10-17 MED ORDER — HYDROCODONE-ACETAMINOPHEN 5-325 MG PO TABS: 1.0000 | ORAL_TABLET | Freq: Four times a day (QID) | ORAL | Status: DC | PRN

## 2014-10-17 MED ORDER — CYCLOBENZAPRINE HCL 5 MG PO TABS: 5.0000 mg | ORAL_TABLET | Freq: Three times a day (TID) | ORAL | Status: DC | PRN

## 2014-10-17 NOTE — Progress Notes (Signed)
   Subjective:    Patient ID: Richard Glass, male    DOB: [birth year], 44 y.o.   MRN: [id number]  HPI 44 y/o male presents for follow up of chronic pain.   Lumbar back pain - patient reports continued low back pain, primarily left sided with radiation down left buttock/leg, has also had left toe numbness, occasional left leg weakness, no saddle anesthesia, no bladder/bowel incontinence  Taking Norco 5-325 mg 3 tablets daily, takes 3.5 tablets daily when he has severe pain  Anxiety - states as controlled, no panic attacks, taking xanax 0.5 mg BID  Social - former smoker   Review of Systems  Constitutional: Negative for fever, chills and fatigue.  Respiratory: Negative for cough and shortness of breath.   Cardiovascular: Negative for chest pain.  Musculoskeletal: Positive for myalgias and arthralgias.       Objective:   Physical Exam Vitals: reviewed Psych: dressed appropriately, affect is blunted, does not appear to be responding to internal stimuli MSK: lumbar - left sided paraspinal tenderness, also tender over left buttock and coccyx region, SLR test positive on the left Neuro: right leg - strength 5/5 to leg flexion/extension and foot plantarflexion/dorsiflexion, sensation intact to light touch, no clonus, 2+ patellar and achilles reflexes; left leg - strength 5/5 to leg flexion/extension and 4+/5 to foot plantarflexion/dorsiflexion, able to stand on bilateral tip-toes however has trouble with unilateral toe raise bilaterally, 1+ patellar reflex, no clonus, 2+ achilles reflex, subjective decreased sensation to light touch throughout left leg  Reviewed previous MRI reading from 2005    Assessment & Plan:  Please see problem specific assessment and plan.

## 2014-10-17 NOTE — Assessment & Plan Note (Signed)
See Overview section and problem list for lumbar pack pain.

## 2014-10-17 NOTE — Assessment & Plan Note (Signed)
Anxiety stable -did not change dose as dealing with increased low back pain -refill provided -wean as tolerated

## 2014-10-17 NOTE — Patient Instructions (Signed)
Chronic Pain - refill of Percocet provided  Anxiety - refill of Xanax provided  Low back pain/leg numbness - per our discussion attempt trial of Flexeril, if not improved when you return in 2 weeks will check MRI of lumbar spine

## 2014-10-17 NOTE — Assessment & Plan Note (Signed)
Patient continues to have left sided lumbar back pain with radiculopathy. No bladder/bowel incontinence or saddle anesthesia.  -offered MRI to further evaluate source, patient declined at this time -has been out of Flexeril, provided short supply, if no improvement will encourage MRI lumbar spine

## 2014-10-26 ENCOUNTER — Encounter: Payer: Self-pay | Admitting: Neurology

## 2014-10-26 ENCOUNTER — Ambulatory Visit (INDEPENDENT_AMBULATORY_CARE_PROVIDER_SITE_OTHER): Payer: Medicare Other | Admitting: Neurology

## 2014-10-26 VITALS — BP 122/77 | HR 78 | Ht 68.0 in | Wt 194.4 lb

## 2014-10-26 DIAGNOSIS — R251 Tremor, unspecified: Secondary | ICD-10-CM | POA: Diagnosis not present

## 2014-10-26 DIAGNOSIS — G252 Other specified forms of tremor: Principal | ICD-10-CM

## 2014-10-26 DIAGNOSIS — M5432 Sciatica, left side: Secondary | ICD-10-CM

## 2014-10-26 DIAGNOSIS — M5430 Sciatica, unspecified side: Secondary | ICD-10-CM | POA: Diagnosis not present

## 2014-10-26 DIAGNOSIS — R569 Unspecified convulsions: Secondary | ICD-10-CM

## 2014-10-26 DIAGNOSIS — G250 Essential tremor: Secondary | ICD-10-CM

## 2014-10-26 DIAGNOSIS — Z5181 Encounter for therapeutic drug level monitoring: Secondary | ICD-10-CM | POA: Diagnosis not present

## 2014-10-26 HISTORY — DX: Sciatica, left side: M54.32

## 2014-10-26 MED ORDER — PREDNISONE 10 MG PO TABS: ORAL_TABLET | ORAL | Status: DC

## 2014-10-26 MED ORDER — PHENOBARBITAL 64.8 MG PO TABS: ORAL_TABLET | ORAL | Status: DC

## 2014-10-26 MED ORDER — TOPIRAMATE 50 MG PO TABS: ORAL_TABLET | ORAL | Status: DC

## 2014-10-26 NOTE — Patient Instructions (Signed)
Sciatica °Sciatica is pain, weakness, numbness, or tingling along your sciatic nerve. The nerve starts in the lower back and runs down the back of each leg. Nerve damage or certain conditions pinch or put pressure on the sciatic nerve. This causes the pain, weakness, and other discomforts of sciatica. °HOME CARE  °· Only take medicine as told by your doctor. °· Apply ice to the affected area for 20 minutes. Do this 3-4 times a day for the first 48-72 hours. Then try heat in the same way. °· Exercise, stretch, or do your usual activities if these do not make your pain worse. °· Go to physical therapy as told by your doctor. °· Keep all doctor visits as told. °· Do not wear high heels or shoes that are not supportive. °· Get a firm mattress if your mattress is too soft to lessen pain and discomfort. °GET HELP RIGHT AWAY IF:  °· You cannot control when you poop (bowel movement) or pee (urinate). °· You have more weakness in your lower back, lower belly (pelvis), butt (buttocks), or legs. °· You have redness or puffiness (swelling) of your back. °· You have a burning feeling when you pee. °· You have pain that gets worse when you lie down. °· You have pain that wakes you from your sleep. °· Your pain is worse than past pain. °· Your pain lasts longer than 4 weeks. °· You are suddenly losing weight without reason. °MAKE SURE YOU:  °· Understand these instructions. °· Will watch this condition. °· Will get help right away if you are not doing well or get worse. °Document Released: 06/11/2008 Document Revised: 03/03/2012 Document Reviewed: 01/12/2012 °[employer]® Patient Information ©2015 [employer]. This information is not intended to replace advice given to you by your health care provider. Make sure you discuss any questions you have with your health care provider. ° °
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
## 2014-10-27 LAB — COMPREHENSIVE METABOLIC PANEL
ALT: 10 IU/L (ref 0–44)
AST: 9 IU/L (ref 0–40)
Albumin/Globulin Ratio: 2 (ref 1.1–2.5)
Albumin: 4.7 g/dL (ref 3.5–5.5)
Alkaline Phosphatase: 98 IU/L (ref 39–117)
BUN/Creatinine Ratio: 6 — ABNORMAL LOW (ref 9–20)
BUN: 5 mg/dL — ABNORMAL LOW (ref 6–24)
Bilirubin Total: 0.2 mg/dL (ref 0.0–1.2)
CO2: 22 mmol/L (ref 18–29)
Calcium: 8.9 mg/dL (ref 8.7–10.2)
Chloride: 105 mmol/L (ref 97–108)
Creatinine, Ser: 0.82 mg/dL (ref 0.76–1.27)
GFR calc Af Amer: 123 mL/min/{1.73_m2} (ref >59)
GFR calc non Af Amer: 107 mL/min/{1.73_m2} (ref >59)
Globulin, Total: 2.3 g/dL (ref 1.5–4.5)
Glucose: 79 mg/dL (ref 65–99)
Potassium: 3.8 mmol/L (ref 3.5–5.2)
Sodium: 141 mmol/L (ref 134–144)
Total Protein: 7 g/dL (ref 6.0–8.5)

## 2014-10-27 LAB — CBC WITH DIFFERENTIAL/PLATELET
Basophils Absolute: 0 10*3/uL (ref 0.0–0.2)
Basos: 0 %
Eos: 2 %
Eosinophils Absolute: 0.2 10*3/uL (ref 0.0–0.4)
HCT: 42.8 % (ref 37.5–51.0)
Hemoglobin: 14.3 g/dL (ref 12.6–17.7)
Immature Grans (Abs): 0 10*3/uL (ref 0.0–0.1)
Immature Granulocytes: 0 %
Lymphocytes Absolute: 3.1 10*3/uL (ref 0.7–3.1)
Lymphs: 35 %
MCH: 29.8 pg (ref 26.6–33.0)
MCHC: 33.4 g/dL (ref 31.5–35.7)
MCV: 89 fL (ref 79–97)
Monocytes Absolute: 0.6 10*3/uL (ref 0.1–0.9)
Monocytes: 7 %
Neutrophils Absolute: 4.9 10*3/uL (ref 1.4–7.0)
Neutrophils Relative %: 56 %
Platelets: 316 10*3/uL (ref 150–379)
RBC: 4.8 x10E6/uL (ref 4.14–5.80)
RDW: 14.2 % (ref 12.3–15.4)
WBC: 8.8 10*3/uL (ref 3.4–10.8)

## 2014-10-27 LAB — PHENOBARBITAL LEVEL: Phenobarbital Lvl: 26 ug/mL (ref 15–40)

## 2014-10-31 ENCOUNTER — Ambulatory Visit: Payer: Medicare Other | Admitting: Family Medicine

## 2014-11-05 ENCOUNTER — Ambulatory Visit
Admission: RE | Admit: 2014-11-05 | Discharge: 2014-11-05 | Disposition: A | Discharge status: Home / Self Care | Payer: Medicare Other | Source: Ambulatory Visit | Attending: Neurology | Admitting: Neurology

## 2014-11-05 DIAGNOSIS — M5432 Sciatica, left side: Secondary | ICD-10-CM

## 2014-11-07 ENCOUNTER — Ambulatory Visit (INDEPENDENT_AMBULATORY_CARE_PROVIDER_SITE_OTHER): Payer: Medicare Other | Admitting: Family Medicine

## 2014-11-07 ENCOUNTER — Telehealth: Payer: Self-pay | Admitting: Neurology

## 2014-11-07 ENCOUNTER — Encounter: Payer: Self-pay | Admitting: Family Medicine

## 2014-11-07 DIAGNOSIS — M5430 Sciatica, unspecified side: Secondary | ICD-10-CM | POA: Diagnosis not present

## 2014-11-07 DIAGNOSIS — Z7189 Other specified counseling: Secondary | ICD-10-CM | POA: Diagnosis not present

## 2014-11-07 DIAGNOSIS — F411 Generalized anxiety disorder: Secondary | ICD-10-CM | POA: Diagnosis not present

## 2014-11-07 DIAGNOSIS — M545 Low back pain: Secondary | ICD-10-CM | POA: Diagnosis not present

## 2014-11-07 DIAGNOSIS — M5440 Lumbago with sciatica, unspecified side: Secondary | ICD-10-CM

## 2014-11-07 MED ORDER — METHOCARBAMOL 500 MG PO TABS
500.0000 mg | ORAL_TABLET | Freq: Three times a day (TID) | ORAL | Status: DC | PRN
Start: 2014-11-07 — End: 2014-12-04

## 2014-11-07 MED ORDER — GABAPENTIN 100 MG PO CAPS: ORAL_CAPSULE | ORAL | Status: DC

## 2014-11-07 NOTE — Telephone Encounter (Signed)
  I called patient. The MRI of the back was unremarkable. I offered to set him up her physical therapy, he will be seeing his primary care doctor tomorrow and discuss the issue with him.  MRI lumbar 11/07/2014:  IMPRESSION: This is a mildly abnormal MRI of the lumbar spine showing mild degenerative changes at L4-L5 and L5-S1 that do not lead to any nerve root impingement.

## 2014-11-07 NOTE — Assessment & Plan Note (Signed)
Patient presents for follow-up of left-sided lumbar back pain with sciatica. -Recently seen by neurology and MRI lumbar spine ordered, results pending at this time -Symptoms are concerning for lumbar degenerative disc disease with neural impingement, no new red flag symptoms -Attempt trial of Robaxin and gabapentin -Based on MRI results patient will likely need referral to neurosurgery versus pain management for epidural injection

## 2014-11-07 NOTE — Patient Instructions (Signed)
The neurologist should be contacting you about your MRI results, however if you do not hear from them in the next few days please call my office  Start Robaxin every 8 hours as needed for muscle spasm  Start Gabapentin 100 mg every 8 hours, may increase to 200 mg (or two tablets) every 8 hours after 2 days

## 2014-11-07 NOTE — Progress Notes (Signed)
   Subjective:    Patient ID: Richard Glass, male    DOB: 01-20-1971, 44 y.o.   MRN: [id number]  HPI 44 year old male presents for follow-up of low back pain with radiculopathy.  Patient continues to have left-sided low back pain with radiation down posterior left leg, reports continued numbness of the first 3 toes of his left foot, denies bladder or bowel incontinence, no saddle anesthesia, patient has been taking 3-1/2 Norco per day which provides minimal relief of the symptoms, patient was recently seen by neurology who ordered MRI of the lumbar spine, results are pending at this time, patient reports some relief of symptoms with when necessary Flexeril   Review of Systems No fevers, no chills    Objective:   Physical Exam Vitals: Reviewed MSK: Tenderness present over midline and left paraspinal lumbar area, strength to hip flexion is 5 over 5, leg extension and leg flexion of both 5 over 5  Results of lumbar MRI pending at this time     Assessment & Plan:  Please see problem specific assessment and plan.

## 2014-12-02 ENCOUNTER — Other Ambulatory Visit: Payer: Self-pay | Admitting: Family Medicine

## 2014-12-04 ENCOUNTER — Other Ambulatory Visit: Payer: Self-pay | Admitting: Family Medicine

## 2014-12-07 ENCOUNTER — Other Ambulatory Visit: Payer: Self-pay | Admitting: *Deleted

## 2014-12-07 NOTE — Telephone Encounter (Signed)
Pharmacy called about refill again. Richard Glass, Richard Glass

## 2014-12-07 NOTE — Telephone Encounter (Signed)
Pharmacy calling about refill again. Richard Glass, Richard Glass

## 2014-12-07 NOTE — Telephone Encounter (Signed)
Opened in error. Richard Glass  

## 2014-12-31 ENCOUNTER — Other Ambulatory Visit: Payer: Self-pay | Admitting: Family Medicine

## 2015-01-09 ENCOUNTER — Encounter: Payer: Self-pay | Admitting: Family Medicine

## 2015-01-09 ENCOUNTER — Ambulatory Visit (INDEPENDENT_AMBULATORY_CARE_PROVIDER_SITE_OTHER): Payer: Medicare Other | Admitting: Family Medicine

## 2015-01-09 VITALS — BP 132/82 | HR 79 | Temp 97.8°F | Ht 69.0 in | Wt 194.8 lb

## 2015-01-09 DIAGNOSIS — M5430 Sciatica, unspecified side: Secondary | ICD-10-CM

## 2015-01-09 DIAGNOSIS — M545 Low back pain: Secondary | ICD-10-CM | POA: Diagnosis not present

## 2015-01-09 DIAGNOSIS — G5602 Carpal tunnel syndrome, left upper limb: Secondary | ICD-10-CM | POA: Diagnosis not present

## 2015-01-09 DIAGNOSIS — Z7189 Other specified counseling: Secondary | ICD-10-CM

## 2015-01-09 DIAGNOSIS — G5600 Carpal tunnel syndrome, unspecified upper limb: Secondary | ICD-10-CM | POA: Insufficient documentation

## 2015-01-09 DIAGNOSIS — J302 Other seasonal allergic rhinitis: Secondary | ICD-10-CM | POA: Diagnosis not present

## 2015-01-09 DIAGNOSIS — G8929 Other chronic pain: Secondary | ICD-10-CM

## 2015-01-09 DIAGNOSIS — M5440 Lumbago with sciatica, unspecified side: Secondary | ICD-10-CM

## 2015-01-09 HISTORY — DX: Carpal tunnel syndrome, unspecified upper limb: G56.00

## 2015-01-09 MED ORDER — HYDROCODONE-ACETAMINOPHEN 5-325 MG PO TABS: 1.0000 | ORAL_TABLET | Freq: Four times a day (QID) | ORAL | Status: DC | PRN

## 2015-01-09 MED ORDER — ALPRAZOLAM 0.5 MG PO TABS: 0.5000 mg | ORAL_TABLET | Freq: Two times a day (BID) | ORAL | Status: DC | PRN

## 2015-01-09 MED ORDER — GABAPENTIN 300 MG PO CAPS: 300.0000 mg | ORAL_CAPSULE | Freq: Three times a day (TID) | ORAL | Status: DC

## 2015-01-09 MED ORDER — WRIST SPLINT/COCK-UP/LEFT L MISC
Status: DC
Start: 2015-01-09 — End: 2016-11-22

## 2015-01-09 NOTE — Assessment & Plan Note (Signed)
Patient returns for refill of Norco for lumbar back pain. -three month supply provided -patient had improvement of symptoms on Gabapentin -will wean Norco as Gabapentin is titrated upwards

## 2015-01-09 NOTE — Assessment & Plan Note (Signed)
Patient presents with history and exam findings consistent with left carpal tunnel -Gabapentin increased to 300 mg TID -script for cock up splints provided to be worn at nighttime

## 2015-01-09 NOTE — Assessment & Plan Note (Signed)
Controlled with Flonase and Zyrtec -continue current treatment

## 2015-01-09 NOTE — Assessment & Plan Note (Signed)
Patient continues to have left sided lumbar back pain with sciatica/radicular symptoms. Improved with Gabapentin. MRI negative for impingement.  -Increase Gabapentin to 300 mg TID -stop Robaxin -Three month supply of Norco provided (wean as Gabapentin is increased)

## 2015-01-09 NOTE — Progress Notes (Signed)
   Subjective:    Patient ID: Richard Glass, male    DOB: 1971-06-27, 44 y.o.   MRN: [id number]  HPI 44 y/o male presents for follow up of lumbar back pain.  Lumbar back pain - chronic low left back pain with radiation to left leg, started on Robaxin and Gabapentin at last visit 2 months ago, had recent MRI with results below as ordered by Neurology, patient reports no improvement with Robaxin, pain is improved with Gabapentin (8/10 down to 5/10), taking Gabapentin 200 mg TID, also taking Norco 2.5-3 tablets daily (down from 3-3.5 tablets per day at visit 3 months ago), radicular symptoms are unchanged from previous exam, no bladder/bowel incontinence or saddle anesthesia.   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  Respiratory: Negative for cough, shortness of breath and wheezing.   Cardiovascular: Negative for chest pain.  Gastrointestinal: Negative for nausea, vomiting and diarrhea.       Objective:   Physical Exam Vitals: reviewed Gen: pleasant male, NAD, Caucasian HEENT: normocephalic, PERRL, EOMI, no scleral icterus, no rhinorrhea, MMM, mild throat erythema, neck supple Cardiac: RRR, S1 and S2 present, no murmur, no heaves/thrills Resp: CTAB, normal effort Ext: no edema MSK: Cervical - ROM is full, minimal bilateral paraspinal tenderness Lumbar - left paraspinal and midline tenderness Left Hand - positive tinels, positive phalens Neuro: strength 5/5 to bilateral hip flexion/kness flexion and extension/for plantar and  dorsiflexion, 2+ patellar and achilles reflexes, no clonus, sensation to light touch grossly diminished in the left lower extremitiy  MRI 11/07/14 IMPRESSION: This is a mildly abnormal MRI of the lumbar spine showing mild degenerative changes at L4-L5 and L5-S1 that do not lead to any nerve root impingement.       Assessment & Plan:  Please see problem specific assessment and plan.

## 2015-01-09 NOTE — Patient Instructions (Addendum)
Allergies - continue daily Flonase and Claritin, use nasal saline drops daily as needed for dry nose  Back Pain - increase Gabapentin to 300 mg three times daily, refills for Norco provided  Right hand pain/numbness - I think that this is carpal tunnel, please wear cock up splints night  Return in one month to follow up on left hand pain.

## 2015-02-06 ENCOUNTER — Ambulatory Visit (HOSPITAL_COMMUNITY)
Admission: RE | Admit: 2015-02-06 | Discharge: 2015-02-06 | Disposition: A | Discharge status: Home / Self Care | Payer: Medicare Other | Source: Ambulatory Visit | Attending: Family Medicine | Admitting: Family Medicine

## 2015-02-06 ENCOUNTER — Ambulatory Visit (INDEPENDENT_AMBULATORY_CARE_PROVIDER_SITE_OTHER): Payer: Medicare Other | Admitting: Family Medicine

## 2015-02-06 ENCOUNTER — Telehealth: Payer: Self-pay | Admitting: Family Medicine

## 2015-02-06 ENCOUNTER — Encounter: Payer: Self-pay | Admitting: Family Medicine

## 2015-02-06 VITALS — BP 133/84 | HR 74 | Temp 97.8°F | Ht 69.0 in | Wt 195.7 lb

## 2015-02-06 DIAGNOSIS — W19XXXA Unspecified fall, initial encounter: Secondary | ICD-10-CM | POA: Diagnosis not present

## 2015-02-06 DIAGNOSIS — G5601 Carpal tunnel syndrome, right upper limb: Secondary | ICD-10-CM

## 2015-02-06 DIAGNOSIS — M542 Cervicalgia: Secondary | ICD-10-CM

## 2015-02-06 DIAGNOSIS — G5603 Carpal tunnel syndrome, bilateral upper limbs: Secondary | ICD-10-CM

## 2015-02-06 DIAGNOSIS — M533 Sacrococcygeal disorders, not elsewhere classified: Secondary | ICD-10-CM

## 2015-02-06 DIAGNOSIS — S3992XA Unspecified injury of lower back, initial encounter: Secondary | ICD-10-CM | POA: Diagnosis not present

## 2015-02-06 DIAGNOSIS — G5602 Carpal tunnel syndrome, left upper limb: Secondary | ICD-10-CM | POA: Diagnosis not present

## 2015-02-06 HISTORY — DX: Cervicalgia: M54.2

## 2015-02-06 HISTORY — DX: Sacrococcygeal disorders, not elsewhere classified: M53.3

## 2015-02-06 MED ORDER — METHYLPREDNISOLONE ACETATE 40 MG/ML IJ SUSP
40.0000 mg | Freq: Once | INTRAMUSCULAR | Status: AC
  Administered 2015-02-06: 40 mg via INTRAMUSCULAR

## 2015-02-06 MED ORDER — GABAPENTIN 300 MG PO CAPS: 600.0000 mg | ORAL_CAPSULE | Freq: Three times a day (TID) | ORAL | Status: DC

## 2015-02-06 MED ORDER — MELOXICAM 7.5 MG PO TABS: 7.5000 mg | ORAL_TABLET | Freq: Every day | ORAL | Status: DC

## 2015-02-06 NOTE — Assessment & Plan Note (Signed)
Patient return for follow up of left carpal tunnel. Now having bilateral symptoms. No improvement with cock up splints and Gabapentin. History and Exam more consistent with cervical spine etiology today. -start Mobic 7.5 mg daily -Increase Gabapentin to 600 mg TID -patient can stop wrist splints as not providing relief. -return in 2-3 weeks

## 2015-02-06 NOTE — Assessment & Plan Note (Signed)
Patient reports neck pain with associated radicular symptoms to upper extremities. MRI cervical spine unremarkable in 2007.  -will start Mobic 7.5 mg daily and increase Gabapentin to 600 mg TID -continue Norco at current dose -offered MRI to better evaluate spine, patient opted conservative therapy first -return in 2-3 week to re-evaluate

## 2015-02-06 NOTE — Addendum Note (Signed)
Addended by: Lupita Dawn on: 02/06/2015 07:51 PM   Modules accepted: Level of Service
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
## 2015-02-06 NOTE — Telephone Encounter (Signed)
Discussed xray results of coccyx and sacrum (radiologist could not completely exclude fracture). If fracture present I told the patient that there is likely little to be done other than pain control. Will monitor symptoms on Norco, Mobic, and Gabapentin. Patient has follow up scheduled in a few weeks.

## 2015-02-06 NOTE — Patient Instructions (Addendum)
Your hand pain may be coming from the nerves in your neck.  Increase Gabapentin to 600 mg three times per day Start Mobic 7.5 mg daily  Return for follow up in 2-3 weeks. If no improvement we may need to order an MRI of your neck.   Please go to the hospital to get an xray of your low back.

## 2015-03-07 ENCOUNTER — Ambulatory Visit (INDEPENDENT_AMBULATORY_CARE_PROVIDER_SITE_OTHER): Payer: Medicare Other | Admitting: Family Medicine

## 2015-03-07 ENCOUNTER — Encounter: Payer: Self-pay | Admitting: Family Medicine

## 2015-03-07 VITALS — BP 126/77 | HR 62 | Temp 98.2°F | Ht 69.0 in | Wt 191.0 lb

## 2015-03-07 DIAGNOSIS — M542 Cervicalgia: Secondary | ICD-10-CM | POA: Diagnosis not present

## 2015-03-07 DIAGNOSIS — M533 Sacrococcygeal disorders, not elsewhere classified: Secondary | ICD-10-CM

## 2015-03-07 NOTE — Patient Instructions (Signed)
It was nice to see you today.   We will check an MRI of your neck to evaluate the pain and numbness that you are having.  Please attempt to take the Mobic again. Continue Gabapentin 600 mg three times per day and Norco.

## 2015-03-08 NOTE — Progress Notes (Signed)
   Subjective:    Patient ID: Richard Glass, male    DOB: October 18, 1970, 44 y.o.   MRN: [id number]  HPI 44 y/o male presents for follow up of neck and low back pain. Recently seen and started on Mobic 7.5 mg daily and increased Gabapentin to 600 mg TID. Takes Norco daily. Reports diarrhea associated with Mobic so stopped this medication.   Neck pain - patient continues to have neck pain with bilateral hand numbness and weakness (right greater than left). Numbness on right is the entire arm and hand (does not follow dermatomal distribution).  Back pain/coccyx pain - patient underwent steroid injection over coccyx at last visit, has improvement of symptoms (pain 5/10 instead of 7/10) for a few weeks however symptoms have returned to baseline. See previous notes for description of symptoms.   Social - former smoker   Review of Systems  Neurological: Positive for weakness and numbness.       Objective:   Physical Exam Vitals: reviewed Gen: pleasant male, NAD MSK: neck/cervical - tenderness midline and paraspinal, ROM limited by pain, reproduction of arm numbness with spurling bilaterally; low back/coccyx - continued tenderness to palpation midline and paraspinal, no redness or bruising noted Neuro: upper extremities - grip strength 5/5, arm flexion and extension 5/5, decreased sensation to light touch over bilateral upper extremities (does not follow dermotome).    Reviewed xray of coccyx which noted that fracture of sacrum could not be excluded however did not note displaced fracture.      Assessment & Plan:  Please see problem specific assessment and plan.

## 2015-03-08 NOTE — Assessment & Plan Note (Signed)
Improved for a few weeks with steroid injection. Symptoms now back to baseline. -encouraged patient to restart Mobic -continue Gabapentin 600 mg TID -continue daily Norco

## 2015-03-08 NOTE — Assessment & Plan Note (Signed)
Patient continues to have neck pain with radicular symptoms. -will check MRI of cervical spine to evaluate for impingement -continue Mobic, Norco, and Gabapentin.

## 2015-03-17 ENCOUNTER — Encounter: Payer: Self-pay | Admitting: Family Medicine

## 2015-03-17 ENCOUNTER — Ambulatory Visit (HOSPITAL_COMMUNITY)
Admission: RE | Admit: 2015-03-17 | Discharge: 2015-03-17 | Disposition: A | Discharge status: Home / Self Care | Payer: Medicare Other | Source: Ambulatory Visit | Attending: Family Medicine | Admitting: Family Medicine

## 2015-03-17 DIAGNOSIS — M47892 Other spondylosis, cervical region: Secondary | ICD-10-CM | POA: Insufficient documentation

## 2015-03-17 DIAGNOSIS — M542 Cervicalgia: Secondary | ICD-10-CM | POA: Diagnosis present

## 2015-04-25 ENCOUNTER — Encounter: Payer: Self-pay | Admitting: Family Medicine

## 2015-04-25 ENCOUNTER — Telehealth: Payer: Self-pay | Admitting: Family Medicine

## 2015-04-25 ENCOUNTER — Ambulatory Visit (INDEPENDENT_AMBULATORY_CARE_PROVIDER_SITE_OTHER): Payer: Medicare Other | Admitting: Family Medicine

## 2015-04-25 ENCOUNTER — Ambulatory Visit (HOSPITAL_COMMUNITY)
Admission: RE | Admit: 2015-04-25 | Discharge: 2015-04-25 | Disposition: A | Discharge status: Home / Self Care | Payer: Medicare Other | Source: Ambulatory Visit | Attending: Family Medicine | Admitting: Family Medicine

## 2015-04-25 VITALS — BP 138/91 | HR 61 | Temp 97.6°F | Wt 193.0 lb

## 2015-04-25 DIAGNOSIS — R0789 Other chest pain: Secondary | ICD-10-CM | POA: Diagnosis present

## 2015-04-25 DIAGNOSIS — Z7189 Other specified counseling: Secondary | ICD-10-CM

## 2015-04-25 DIAGNOSIS — R0782 Intercostal pain: Secondary | ICD-10-CM

## 2015-04-25 DIAGNOSIS — M545 Low back pain: Secondary | ICD-10-CM | POA: Diagnosis not present

## 2015-04-25 DIAGNOSIS — M542 Cervicalgia: Secondary | ICD-10-CM

## 2015-04-25 DIAGNOSIS — F411 Generalized anxiety disorder: Secondary | ICD-10-CM | POA: Diagnosis not present

## 2015-04-25 DIAGNOSIS — J42 Unspecified chronic bronchitis: Secondary | ICD-10-CM | POA: Diagnosis not present

## 2015-04-25 DIAGNOSIS — M5430 Sciatica, unspecified side: Secondary | ICD-10-CM

## 2015-04-25 DIAGNOSIS — G8929 Other chronic pain: Secondary | ICD-10-CM

## 2015-04-25 DIAGNOSIS — M5440 Lumbago with sciatica, unspecified side: Secondary | ICD-10-CM

## 2015-04-25 HISTORY — DX: Intercostal pain: R07.82

## 2015-04-25 MED ORDER — HYDROCODONE-ACETAMINOPHEN 5-325 MG PO TABS: 1.0000 | ORAL_TABLET | Freq: Four times a day (QID) | ORAL | Status: DC | PRN

## 2015-04-25 MED ORDER — ALPRAZOLAM 0.5 MG PO TABS: 0.5000 mg | ORAL_TABLET | Freq: Two times a day (BID) | ORAL | Status: DC | PRN

## 2015-04-25 MED ORDER — DULOXETINE HCL 30 MG PO CPEP: 30.0000 mg | ORAL_CAPSULE | Freq: Every day | ORAL | Status: DC

## 2015-04-25 MED ORDER — GABAPENTIN 300 MG PO CAPS: 600.0000 mg | ORAL_CAPSULE | Freq: Three times a day (TID) | ORAL | Status: DC

## 2015-04-25 NOTE — Assessment & Plan Note (Signed)
Refills for Xanax and Norco provided. -Please refer to individual assessment and plans for chronic pain diagnoses

## 2015-04-25 NOTE — Assessment & Plan Note (Addendum)
Patient continues to have daily anxiety likely related to multiple medical issues. -Refill of Xanax provided -Started Cymbalta 30 mg daily -Provided business card for Dr. Gwenlyn Saran as I feel the patient could benefit from psychotherapy

## 2015-04-25 NOTE — Assessment & Plan Note (Signed)
Patient has two-week history of right-sided inferior lateral intercostal pain associated with cough. Pain is atypical in nature and is not concerning for cardiac etiology at this time. Wells score is 0 which places the patient at very low risk for PE. -Check chest x-ray to rule out pneumonia

## 2015-04-25 NOTE — Telephone Encounter (Signed)
Called patient to discuss relatively negative CXR, some COPD changes but patient asymptomatic

## 2015-04-25 NOTE — Progress Notes (Signed)
   Subjective:    Patient ID: Richard Glass, male    DOB: December 01, 1970, 44 y.o.   MRN: [id number]  HPI 44 year old male presents for follow-up of chronic neck and back pain.  Cervical neck pain - patient continues to have radicular symptoms with numbness of the entire left upper extremity, no weakness noted, had recent MRI which is outlined in the objective section, patient continues to take Norco 5-325 at least 2-3 times per day, he also continues to take gabapentin 600 mg 3 times a day, he has restarted Mobic7.5 mg however has noted associated diarrhea, he ran out of [place] one week ago and has since noticed improvement of his GI symptoms  Lumbosacral back pain - patient continues to have stable radicular symptoms with numbness of the left lower extremity, no bladder or bowel incontinence, no fevers, no saddle anesthesia  Right-sided intercostal pain - patient admits to two-week history of right-sided anterior chest pain located over the inferior ribs, this is associated with a cough that has been productive of phlegm, no associated fevers or chills, no radiation of the pain to the left side of his chest or up to the jaw, no radiation of the chest pain to the left arm, no associated shortness of breath. Pain is present at rest and not associated with exertion, pain does worsen with deep breath.  Anxiety-patient admits to continued anxiety on a daily basis, he is unable to identify triggers, taking Xanax 1-2 times per day, he has never been evaluated by a psychologist or psychiatrist, denies depressive symptoms  Social-patient works as a farm hand, former smoker   Review of Systems  Constitutional: Negative for fever, chills and fatigue.  Respiratory: Positive for cough. Negative for shortness of breath.   Cardiovascular: Negative for chest pain, palpitations and leg swelling.  Gastrointestinal: Positive for diarrhea. Negative for nausea.       Objective:   Physical Exam Vitals: Reviewed    Gen.: Pleasant Caucasian male, no acute distress, patient does become tearful with muscle spasm Cardiac: Regular rate and rhythm, S1 and S2 present, no murmurs, no heaves or thrills  Respiratory: Clear to station bilaterally, normal effort  MSK: Tenderness to patient over the right inferior anterior ribs  Abdomen: Soft, very minimal right upper quadrant tenderness, bowel sounds present  Neuro: Cranial nerves II through XII intact. Cervical spine - bilateral upper extremity strength is 5/5 to grip strength, arm flexion, arm extension, shoulder abduction and adduction, decreased sensation to light touch noted over the left upper extremity, bilateral brachial radialis and biceps reflexes were 1+; lumbar - strength in bilateral lower extremities was 5/5 to hip flexion, knee extension, knee flexion, plantarflexion, dorsiflexion, sensation to light touch was decreased over the left lower extremity, bilateral patellar reflexes were 1+  Reviewed MRI of cervical spine performed after last visit which showed degenerative changes of the spine without impingement of the spinal cord     Assessment & Plan:  Please see problem specific assessment and plan.

## 2015-04-25 NOTE — Assessment & Plan Note (Signed)
Patient continues to have stable cervical neck pain with radicular symptoms. MRI of cervical spine performed in July 2016 was negative for cervical spine impingement Suspect the patient's symptoms are related to chronic degenerative disc disease as well as multiple left-sided shoulder surgeries -Continue current regimen of Norco and gabapentin -Add Cymbalta 30 mg by mouth daily to current regimen

## 2015-04-25 NOTE — Patient Instructions (Signed)
It was nice to see you today.  You have been given refills of Xanax, Norco, and Gabapentin.  Cymbalta (new medication) for nerves and pain has been sent to your pharmacy.  Dr. Ree Kida has ordered a chest xray. Please go to [facility] to have this completed.

## 2015-04-25 NOTE — Assessment & Plan Note (Signed)
Patient is stable lumbar back pain with sciatica -Continue Norco 5-325 2-3 tablets daily, gabapentin 600 3 times a day -Discontinue Mobic as this is causing diarrhea -Start Cymbalta 30 mg daily

## 2015-04-26 ENCOUNTER — Ambulatory Visit (INDEPENDENT_AMBULATORY_CARE_PROVIDER_SITE_OTHER): Payer: Medicare Other | Admitting: Adult Health

## 2015-04-26 ENCOUNTER — Encounter: Payer: Self-pay | Admitting: Adult Health

## 2015-04-26 VITALS — BP 120/77 | HR 62 | Ht 69.0 in | Wt 194.0 lb

## 2015-04-26 DIAGNOSIS — R569 Unspecified convulsions: Secondary | ICD-10-CM | POA: Diagnosis not present

## 2015-04-26 DIAGNOSIS — Z5181 Encounter for therapeutic drug level monitoring: Secondary | ICD-10-CM | POA: Diagnosis not present

## 2015-04-26 MED ORDER — TOPIRAMATE 50 MG PO TABS: ORAL_TABLET | ORAL | Status: DC

## 2015-04-26 MED ORDER — PHENOBARBITAL 64.8 MG PO TABS: ORAL_TABLET | ORAL | Status: DC
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
## 2015-04-26 NOTE — Patient Instructions (Signed)
Continue phenobarbital and Topamax. I will check blood work today. If you have any seizure events please let us know.

## 2015-04-26 NOTE — Progress Notes (Signed)
I have read the note, and I agree with the clinical assessment and plan.  Hazen Brumett KEITH   

## 2015-04-28 ENCOUNTER — Telehealth: Payer: Self-pay | Admitting: Adult Health

## 2015-04-28 LAB — CBC WITH DIFFERENTIAL/PLATELET
Basophils Absolute: 0 10*3/uL (ref 0.0–0.2)
Basos: 0 %
EOS (ABSOLUTE): 0.2 10*3/uL (ref 0.0–0.4)
Eos: 2 %
Hematocrit: 43.9 % (ref 37.5–51.0)
Hemoglobin: 14.3 g/dL (ref 12.6–17.7)
Immature Grans (Abs): 0 10*3/uL (ref 0.0–0.1)
Immature Granulocytes: 0 %
Lymphocytes Absolute: 2.9 10*3/uL (ref 0.7–3.1)
Lymphs: 29 %
MCH: 29.9 pg (ref 26.6–33.0)
MCHC: 32.6 g/dL (ref 31.5–35.7)
MCV: 92 fL (ref 79–97)
Monocytes Absolute: 0.8 10*3/uL (ref 0.1–0.9)
Monocytes: 8 %
Neutrophils Absolute: 5.9 10*3/uL (ref 1.4–7.0)
Neutrophils: 61 %
Platelets: 299 10*3/uL (ref 150–379)
RBC: 4.79 x10E6/uL (ref 4.14–5.80)
RDW: 14.3 % (ref 12.3–15.4)
WBC: 9.9 10*3/uL (ref 3.4–10.8)

## 2015-04-28 LAB — COMPREHENSIVE METABOLIC PANEL
ALT: 14 IU/L (ref 0–44)
AST: 12 IU/L (ref 0–40)
Albumin/Globulin Ratio: 2 (ref 1.1–2.5)
Albumin: 4.6 g/dL (ref 3.5–5.5)
Alkaline Phosphatase: 102 IU/L (ref 39–117)
BUN/Creatinine Ratio: 8 — ABNORMAL LOW (ref 9–20)
BUN: 7 mg/dL (ref 6–24)
Bilirubin Total: <0.2 mg/dL (ref 0.0–1.2)
CO2: 21 mmol/L (ref 18–29)
Calcium: 9.5 mg/dL (ref 8.7–10.2)
Chloride: 102 mmol/L (ref 97–108)
Creatinine, Ser: 0.83 mg/dL (ref 0.76–1.27)
GFR calc Af Amer: 122 mL/min/{1.73_m2} (ref >59)
GFR calc non Af Amer: 106 mL/min/{1.73_m2} (ref >59)
Globulin, Total: 2.3 g/dL (ref 1.5–4.5)
Glucose: 80 mg/dL (ref 65–99)
Potassium: 4.3 mmol/L (ref 3.5–5.2)
Sodium: 140 mmol/L (ref 134–144)
Total Protein: 6.9 g/dL (ref 6.0–8.5)

## 2015-04-28 LAB — PHENOBARBITAL LEVEL: Phenobarbital, Serum: 25 ug/mL (ref 15–40)

## 2015-04-28 NOTE — Telephone Encounter (Signed)
I called but the patient was not available. I advised the person answering the phone that he could call Monday in regards to lab results.

## 2015-05-01 ENCOUNTER — Other Ambulatory Visit: Payer: Self-pay | Admitting: Family Medicine

## 2015-05-01 ENCOUNTER — Telehealth: Payer: Self-pay

## 2015-05-01 NOTE — Telephone Encounter (Signed)
-----   Message from Ward Givens, NP sent at 05/01/2015  7:45 AM EDT ----- Lab work is normal. Tried calling on Friday. Please call patient. Thanks.

## 2015-05-01 NOTE — Telephone Encounter (Signed)
Called and spoke to patient relayed Normal Labs. Patient understood.

## 2015-05-01 NOTE — Telephone Encounter (Signed)
Patient is returning your call.  

## 2015-05-01 NOTE — Telephone Encounter (Signed)
Called patient and spoke to him relayed labs were normal. Patient understood process.

## 2015-05-29 ENCOUNTER — Ambulatory Visit (INDEPENDENT_AMBULATORY_CARE_PROVIDER_SITE_OTHER): Payer: Medicare Other | Admitting: *Deleted

## 2015-05-29 DIAGNOSIS — Z23 Encounter for immunization: Secondary | ICD-10-CM

## 2015-07-17 ENCOUNTER — Other Ambulatory Visit: Payer: Self-pay | Admitting: Family Medicine

## 2015-07-20 ENCOUNTER — Ambulatory Visit (HOSPITAL_COMMUNITY)
Admission: RE | Admit: 2015-07-20 | Discharge: 2015-07-20 | Disposition: A | Discharge status: Home / Self Care | Payer: Medicare Other | Source: Ambulatory Visit | Attending: Family Medicine | Admitting: Family Medicine

## 2015-07-20 ENCOUNTER — Encounter: Payer: Self-pay | Admitting: Family Medicine

## 2015-07-20 ENCOUNTER — Ambulatory Visit (INDEPENDENT_AMBULATORY_CARE_PROVIDER_SITE_OTHER): Payer: Medicare Other | Admitting: Family Medicine

## 2015-07-20 VITALS — BP 110/70 | HR 70 | Temp 98.4°F | Ht 69.0 in | Wt 196.0 lb

## 2015-07-20 DIAGNOSIS — Z7189 Other specified counseling: Secondary | ICD-10-CM | POA: Diagnosis not present

## 2015-07-20 DIAGNOSIS — M79662 Pain in left lower leg: Secondary | ICD-10-CM

## 2015-07-20 DIAGNOSIS — M7989 Other specified soft tissue disorders: Secondary | ICD-10-CM | POA: Diagnosis not present

## 2015-07-20 DIAGNOSIS — M5440 Lumbago with sciatica, unspecified side: Secondary | ICD-10-CM

## 2015-07-20 DIAGNOSIS — M79605 Pain in left leg: Secondary | ICD-10-CM | POA: Diagnosis not present

## 2015-07-20 DIAGNOSIS — F411 Generalized anxiety disorder: Secondary | ICD-10-CM

## 2015-07-20 DIAGNOSIS — G4700 Insomnia, unspecified: Secondary | ICD-10-CM

## 2015-07-20 DIAGNOSIS — M545 Low back pain: Secondary | ICD-10-CM

## 2015-07-20 DIAGNOSIS — M5430 Sciatica, unspecified side: Secondary | ICD-10-CM

## 2015-07-20 DIAGNOSIS — G8929 Other chronic pain: Secondary | ICD-10-CM

## 2015-07-20 MED ORDER — MELATONIN 3 MG PO TABS: 3.0000 mg | ORAL_TABLET | Freq: Every day | ORAL | Status: DC

## 2015-07-20 MED ORDER — FLUTICASONE PROPIONATE 50 MCG/ACT NA SUSP: NASAL | Status: DC

## 2015-07-20 MED ORDER — HYDROCODONE-ACETAMINOPHEN 5-325 MG PO TABS: 1.0000 | ORAL_TABLET | Freq: Four times a day (QID) | ORAL | Status: DC | PRN

## 2015-07-20 MED ORDER — GABAPENTIN 300 MG PO CAPS: 600.0000 mg | ORAL_CAPSULE | Freq: Three times a day (TID) | ORAL | Status: DC

## 2015-07-20 MED ORDER — ALPRAZOLAM 0.5 MG PO TABS: 0.5000 mg | ORAL_TABLET | Freq: Two times a day (BID) | ORAL | Status: DC | PRN

## 2015-07-20 NOTE — Assessment & Plan Note (Signed)
Patient presents for refill on chronic narcotic pain medications -Decreased Norco to 100 tablets monthly

## 2015-07-20 NOTE — Progress Notes (Signed)
Preliminary results by tech - Venous Duplex Lower Ext. Completed. Negative for deep and superficial vein thrombosis in both lower extremities.  Oda Cogan, BS, RDMS, RVT

## 2015-07-20 NOTE — Assessment & Plan Note (Signed)
Acute left calf pain over the past 3 weeks. -Patient sent for stat lower extremity Doppler to rule out DVT -No respiratory symptoms/hypoxia to suggest pulmonary involvement

## 2015-07-20 NOTE — Assessment & Plan Note (Signed)
Lumbar back pain is stable, no new neurologic symptoms. -Decreased Norco 5-325 to 100 tablets monthly -Continue Cymbalta 30 mg every other day (due to GI side effects)

## 2015-07-20 NOTE — Assessment & Plan Note (Addendum)
Anxiety is stable. GAD 7 score of 8. - 3 month supply of Xanax provided -Continue Cymbalta 30 mg every other day (not daily due to GI side effects)

## 2015-07-20 NOTE — Assessment & Plan Note (Signed)
Patient reports nightly insomnia -Attempt trial of melatonin 3 mg nightly

## 2015-07-20 NOTE — Progress Notes (Signed)
   Subjective:    Patient ID: ABDURRAHMAN PETERSHEIM, male    DOB: 11-Feb-1971, 44 y.o.   MRN: [id number]  HPI 44 year old male presents for routine follow-up.  Left calf pain - new issue over the past 3 weeks, associated cramping and swelling, no redness noted, no recent surgery, no recent trauma, no associated shortness of breath  Chronic pain - lumbar back pain with left-sided radicular symptoms, stable in nature, taking Norco 3-4 times per day, no new neurologic symptoms, no bladder or bowel incontinence, no saddle anesthesia  Anxiety - patient started on Cymbalta 30 mg daily at last visit, patient reports diarrhea with this medication and is taking every other day, diarrhea has improved, anxiety is stable, still taking Xanax 0.5 mg twice a day, still reports jittery and anxiety almost on a daily basis; patient reports associated insomnia, he reports difficulty falling asleep at night, no nighttime awakenings   Review of Systems  Constitutional: Negative for fever, chills and fatigue.  Respiratory: Negative for cough, choking and shortness of breath.   Cardiovascular: Positive for leg swelling.  Gastrointestinal: Positive for diarrhea. Negative for nausea and vomiting.       Objective:   Physical Exam Vitals: Reviewed Gen.: Pleasant male, no acute distress Cardiac: Regular rate and rhythm, S1 and S2 present, no murmurs, no heaves or thrills Respiratory: Clear to auscultation bilaterally, normal effort MSK: Lumbar - left-sided paraspinal tenderness, no midline tenderness; left calf-tenderness to palpation, left calf measures 38 cm compared to right calf which measures 37 cm, no palpable cords, positive Homans sign  GAD 7 score of 8 (not difficulty with activities)  Flu shot provided    Assessment & Plan:  No problem-specific assessment & plan notes found for this encounter.

## 2015-07-20 NOTE — Patient Instructions (Signed)
It was nice to see you today.  You have been given refills on your pain medications.  Please go directly to the hospital to get an Ultrasound of your legs to make sure you do not have a blood clot.

## 2015-07-21 ENCOUNTER — Telehealth: Payer: Self-pay | Admitting: Family Medicine

## 2015-07-21 NOTE — Telephone Encounter (Signed)
Discussed negative LE doppler with patient. Pain in calf likely due to muscle spasm. Encouraged daily ice/elevation/compression.

## 2015-08-05 ENCOUNTER — Other Ambulatory Visit: Payer: Self-pay | Admitting: Family Medicine

## 2015-10-20 ENCOUNTER — Ambulatory Visit (INDEPENDENT_AMBULATORY_CARE_PROVIDER_SITE_OTHER): Payer: Medicare Other | Admitting: Family Medicine

## 2015-10-20 ENCOUNTER — Encounter: Payer: Self-pay | Admitting: Family Medicine

## 2015-10-20 VITALS — BP 125/77 | HR 61 | Temp 97.7°F | Wt 194.0 lb

## 2015-10-20 DIAGNOSIS — F411 Generalized anxiety disorder: Secondary | ICD-10-CM

## 2015-10-20 DIAGNOSIS — Z0000 Encounter for general adult medical examination without abnormal findings: Secondary | ICD-10-CM

## 2015-10-20 DIAGNOSIS — J392 Other diseases of pharynx: Secondary | ICD-10-CM

## 2015-10-20 DIAGNOSIS — G8929 Other chronic pain: Secondary | ICD-10-CM

## 2015-10-20 DIAGNOSIS — J029 Acute pharyngitis, unspecified: Secondary | ICD-10-CM | POA: Diagnosis not present

## 2015-10-20 DIAGNOSIS — M542 Cervicalgia: Secondary | ICD-10-CM

## 2015-10-20 DIAGNOSIS — Z7189 Other specified counseling: Secondary | ICD-10-CM

## 2015-10-20 DIAGNOSIS — Z114 Encounter for screening for human immunodeficiency virus [HIV]: Secondary | ICD-10-CM

## 2015-10-20 MED ORDER — FLUTICASONE PROPIONATE 50 MCG/ACT NA SUSP: NASAL | Status: DC

## 2015-10-20 MED ORDER — ALPRAZOLAM 0.5 MG PO TABS: 0.5000 mg | ORAL_TABLET | Freq: Two times a day (BID) | ORAL | Status: DC | PRN

## 2015-10-20 MED ORDER — HYDROCODONE-ACETAMINOPHEN 5-325 MG PO TABS
1.0000 | ORAL_TABLET | Freq: Four times a day (QID) | ORAL | Status: DC | PRN
Start: 2015-11-17 — End: 2015-10-20

## 2015-10-20 MED ORDER — HYDROCODONE-ACETAMINOPHEN 5-325 MG PO TABS: 1.0000 | ORAL_TABLET | Freq: Four times a day (QID) | ORAL | Status: DC | PRN

## 2015-10-20 MED ORDER — NORTRIPTYLINE HCL 10 MG PO CAPS: 10.0000 mg | ORAL_CAPSULE | Freq: Every day | ORAL | Status: DC

## 2015-10-20 NOTE — Patient Instructions (Signed)
It was nice to see you today.  I have refills your pain medications and xanax.  You have been referred to ENT (ear, nose, throat doctor).

## 2015-10-20 NOTE — Assessment & Plan Note (Signed)
Continued pharyngeal erythema and irritation. Previously thought to be due to allergic rhinitis/postnasal drip. Not resolved with Flonase and Zyrtec. -referral to ENT for further evaluation -check HIV to rule out immunodeficiency

## 2015-10-20 NOTE — Assessment & Plan Note (Signed)
Refills of Norco and Xanax provided today.

## 2015-10-20 NOTE — Assessment & Plan Note (Signed)
Symptoms stable -refill of Xanax provided -started Nortriptyline (no help with anxiety and chronic pain) -stop Cymbalta due to side effects

## 2015-10-20 NOTE — Assessment & Plan Note (Signed)
Patient continues to have cervical spine pain with chronic left hand numbness. Now with increasing right hand numbness. MRI showed very mild cervical DDD in 03/2016. No other red flag symptoms.  -Change Cymbalta to Nortriptyline (unable to tolerate Cymbalta due to diarrhea) -continue Norco prn -Continue Gabapentin 600 mg TID (consider increase in future)

## 2015-10-20 NOTE — Progress Notes (Signed)
Patient ID: Richard Glass, male   DOB: 1970-12-18, 45 y.o.   MRN: [id number]   Subjective:  This history was provided by the patient.  Richard Glass is a 45 y.o. male who  has a past medical history of Traumatic cerebral hemorrhage ([place]) (1975); Abnormal head CT (05/2011); Seizures ([place]); Asthma; Anxiety; Headache(784.0); Chronic low back pain; Restless legs syndrome (RLS); Dyslipidemia; Degenerative arthritis; Sinusitis chronic, frontal; and Sciatica of left side (10/26/2014).  Patient also reports a history of GERD.    Patient primarily presents today for a refill on his Vicodin, Flonase and Xanax and states his chronic neck, shoulder and hip pain persist.  Patient also mentioned a "burning throat" x3 weeks.  Patient also endorses nasal stuffiness due to using heat in the house, but denies productive cough, fever, ear ache, headache and N/V/D.  Patient has used lozenges for throat pain with no relief. Does note some increased numbness in right hand/arm since last visit. Anxiety symptoms are stable with xanax twice daily. Chronic pain symptoms are stable with Norco 3 times per day (will occasionally take 4 per day).   Review of Systems:  Per HPI. All other systems reviewed and are negative. Diarrhea with Cymbalta which has resolved since discontinuation.    PMH, PSH, Medications, Allergies, and FmHx reviewed and updated in EMR.  Social History: Former smoker  Objective:  BP 125/77 mmHg  Pulse 61  Temp(Src) 97.7 F (36.5 C) (Oral)  Wt 194 lb (87.998 kg)  Gen:  45 y.o. male in NAD HEENT: Oropharynx exceptionally erythematous without exudate or swelling. Tonsils not involved. No LAD.  Nasal turbinates not inflamed, no rhinorrhea, EOMI, PERRL, sclera not icteric.  TM gray, not bulging or red, no otorrhea. CV: RRR, no MRG, no JVD, S1/S2 present Resp: Non-labored, CTAB, no wheezes noted Abd: Soft, NTND, BS present, no guarding Ext:  MAE, No edema MSK: Full ROM, strength intact Neuro: Alert  and oriented, speech normal  Reviewed Cervical MRI from 2016 which showed mild Cervical DDD with no spinal cord impingement.      Assessment & Plan:  [name] is a 45 y.o. male here for medication refills and a sore throat. Patient has had previous episodes of sore throat and saw an ENT doctor, but cannot remember how they treated him or the diagnosis related to those episodes.  Patient is using Flonase and Zyrtec daily with little relief of sinus symptoms.  Will add Amitriptyline for chronic pain to see if that helps.  Patient to continue Flonase and Zyrtec as well.  - Referral to ENT placed - Patient given prescriptions for Xanax and Vicodin - Prescriptions for Flonase and Amitriptyline sent to patient's pharmacy electronically   Throat irritation Continued pharyngeal erythema and irritation. Previously thought to be due to allergic rhinitis/postnasal drip. Not resolved with Flonase and Zyrtec. -referral to ENT for further evaluation -check HIV to rule out immunodeficiency  Cervical spine pain Patient continues to have cervical spine pain with chronic left hand numbness. Now with increasing right hand numbness. MRI showed very mild cervical DDD in 03/2016. No other red flag symptoms.  -Change Cymbalta to Nortriptyline (unable to tolerate Cymbalta due to diarrhea) -continue Norco prn -Continue Gabapentin 600 mg TID (consider increase in future)  Sore throat Continued pharyngeal erythema and irritation. Previously thought to be due to allergic rhinitis/postnasal drip. Not resolved with Flonase and Zyrtec. -referral to ENT for further evaluation -check HIV to rule out immunodeficiency  Encounter for chronic pain management Refills  of Norco and Xanax provided today.   Anxiety state Symptoms stable -refill of Xanax provided -started Nortriptyline (no help with anxiety and chronic pain) -stop Cymbalta due to side effects   Sherron Ales, NP [facility] 10/20/2015 12:57 PM  Dossie Arbour MD

## 2015-10-21 LAB — HIV ANTIBODY (ROUTINE TESTING W REFLEX): HIV 1&2 Ab, 4th Generation: NONREACTIVE

## 2015-10-23 ENCOUNTER — Encounter: Payer: Self-pay | Admitting: Family Medicine

## 2015-11-05 ENCOUNTER — Other Ambulatory Visit: Payer: Self-pay | Admitting: Family Medicine

## 2015-11-30 ENCOUNTER — Other Ambulatory Visit: Payer: Self-pay | Admitting: Adult Health

## 2015-12-20 ENCOUNTER — Other Ambulatory Visit: Payer: Self-pay | Admitting: *Deleted

## 2015-12-20 NOTE — Telephone Encounter (Signed)
90 day supply. Shavaun Osterloh L, RN  

## 2015-12-21 MED ORDER — FLUTICASONE PROPIONATE 50 MCG/ACT NA SUSP: NASAL | Status: DC

## 2015-12-21 MED ORDER — NORTRIPTYLINE HCL 10 MG PO CAPS: 10.0000 mg | ORAL_CAPSULE | Freq: Every day | ORAL | Status: DC

## 2016-01-12 ENCOUNTER — Ambulatory Visit: Payer: Medicare Other | Admitting: Family Medicine

## 2016-01-18 ENCOUNTER — Encounter: Payer: Self-pay | Admitting: Family Medicine

## 2016-01-18 ENCOUNTER — Ambulatory Visit (INDEPENDENT_AMBULATORY_CARE_PROVIDER_SITE_OTHER): Payer: Medicare Other | Admitting: Family Medicine

## 2016-01-18 VITALS — BP 127/79 | HR 72 | Temp 97.5°F | Ht 69.0 in | Wt 191.1 lb

## 2016-01-18 DIAGNOSIS — M5430 Sciatica, unspecified side: Secondary | ICD-10-CM

## 2016-01-18 DIAGNOSIS — G8929 Other chronic pain: Secondary | ICD-10-CM

## 2016-01-18 DIAGNOSIS — G5762 Lesion of plantar nerve, left lower limb: Secondary | ICD-10-CM

## 2016-01-18 DIAGNOSIS — G252 Other specified forms of tremor: Secondary | ICD-10-CM

## 2016-01-18 DIAGNOSIS — T148 Other injury of unspecified body region: Secondary | ICD-10-CM

## 2016-01-18 DIAGNOSIS — W57XXXA Bitten or stung by nonvenomous insect and other nonvenomous arthropods, initial encounter: Secondary | ICD-10-CM

## 2016-01-18 DIAGNOSIS — M545 Low back pain: Secondary | ICD-10-CM

## 2016-01-18 DIAGNOSIS — Z7189 Other specified counseling: Secondary | ICD-10-CM | POA: Diagnosis not present

## 2016-01-18 DIAGNOSIS — G894 Chronic pain syndrome: Secondary | ICD-10-CM | POA: Insufficient documentation

## 2016-01-18 DIAGNOSIS — M5440 Lumbago with sciatica, unspecified side: Secondary | ICD-10-CM

## 2016-01-18 HISTORY — DX: Other chronic pain: G89.29

## 2016-01-18 HISTORY — DX: Lesion of plantar nerve, left lower limb: G57.62

## 2016-01-18 HISTORY — DX: Chronic pain syndrome: G89.4

## 2016-01-18 MED ORDER — HYDROCODONE-ACETAMINOPHEN 5-325 MG PO TABS: 1.0000 | ORAL_TABLET | Freq: Four times a day (QID) | ORAL | Status: DC | PRN

## 2016-01-18 MED ORDER — ALPRAZOLAM 0.5 MG PO TABS: 0.5000 mg | ORAL_TABLET | Freq: Two times a day (BID) | ORAL | Status: DC | PRN

## 2016-01-18 MED ORDER — GABAPENTIN 300 MG PO CAPS: 900.0000 mg | ORAL_CAPSULE | Freq: Three times a day (TID) | ORAL | Status: DC

## 2016-01-18 MED ORDER — NORTRIPTYLINE HCL 25 MG PO CAPS: 25.0000 mg | ORAL_CAPSULE | Freq: Every day | ORAL | Status: DC

## 2016-01-18 NOTE — Progress Notes (Signed)
   Subjective:    Patient ID: Richard Glass, male    DOB: 09/24/1970, 45 y.o.   MRN: [id number]  HPI 45 y/o male presents for routine follow up.  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 No bladder/bowel incontinence. Previous epidural steroid injections have been unsuccessful at relieving pain.   Bug bite - patient thinks he had tick bite 2 weeks ago, has had rash/ithcing in the area, has applied peroxide and Neosporin with little benefit  Left foot pain - middle of foot over metatarsals, some associated 2nd/3rd toe numbness, no injury, wears Nikes/comfortable shoes during the day  Social - nonsmoker   Review of Systems  Constitutional: Negative for fever, chills and fatigue.  Respiratory: Negative for cough and shortness of breath.   Cardiovascular: Negative for chest pain.  Gastrointestinal: Negative for nausea and diarrhea.  Occasional loose stools     Objective:   Physical Exam BP 127/79 mmHg  Pulse 72  Temp(Src) 97.5 F (36.4 C) (Oral)  Ht 5\' 9"  (1.753 m)  Wt 191 lb 1.6 oz (86.682 kg)  BMI 28.21 kg/m2   Gen: pleasant male, NAD Cardiac: RRR, S1 and S2 present, no murmur, no heaves/thrills Resp: CTAB, normal effort Abd: soft, no tenderness Skin: bite on abdomen (see picture) MSK: bilateral lumbar paraspinal tenderness, left foot - tenderness over interspace of 2nd/3rd metatarsals Neuro: Bilateral leg strength 5/5, bilateral grip strength 5/5, sensation to light touch intact in all extremities, slight intention tremor    Bug bite on Abdomen      Assessment & Plan:  Back pain of lumbar region with  sciatica Stable back pain. No new reg flag signs/symptoms. -Refill of Norco provided (3 months) -Increase Gabapentin to 900 mg TID -Increase Pamelor to 25 mg QHS  Encounter for chronic pain management Three month supply of Norco provided. 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 Slight worsened over the past few months. 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 -encouraged use of Benadryl cream to break the scratch/itch cycle -rash NOT consistent with Erythema Migrans

## 2016-01-18 NOTE — Assessment & Plan Note (Addendum)
Stable back pain. No new reg flag signs/symptoms. -Refill of Norco provided (3 months) -Increase Gabapentin to 900 mg TID -Increase Pamelor to 25 mg QHS

## 2016-01-18 NOTE — Assessment & Plan Note (Signed)
Irritated bug bit on abdomen without infection. -encouraged use of Benadryl cream to break the scratch/itch cycle -rash NOT consistent with Erythema Migrans

## 2016-01-18 NOTE — Assessment & Plan Note (Signed)
Three month supply of Norco provided. -UDS today -increase Pamelor to 25 mg daily -increase Gabapentin to 900 TID

## 2016-01-18 NOTE — Patient Instructions (Addendum)
It was nice to see you today.  Bite on your abdomen - apply benadryl cream (over the counter).   I have given you refills of Xanax and Norco.   Return in 3 months.

## 2016-01-18 NOTE — Assessment & Plan Note (Signed)
Mortons neuroma of left foot (between 2nd/3rd digits) -counseled on padding in affected area -continue Norco/Gabapentin for pain -if symptoms continue return for steroid injection

## 2016-01-18 NOTE — Assessment & Plan Note (Signed)
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 Slight worsened over the past few months. -continue Phenobarbital and Topamax (managed by Neurology) -continue Xanax twice daily

## 2016-01-19 LAB — DRUG SCR UR, PAIN MGMT, REFLEX CONF
Amphetamine Screen, Ur: NEGATIVE
Cocaine Metabolites: NEGATIVE
Creatinine,U: 80.87 mg/dL
Marijuana Metabolite: NEGATIVE
Methadone: NEGATIVE
Opiates: NEGATIVE
Phencyclidine (PCP): NEGATIVE
Propoxyphene: NEGATIVE

## 2016-01-24 LAB — BARBITURATES (GC/LC/MS), URINE
Amobarbital: NEGATIVE ng/mL (ref <100)
Butalbital: NEGATIVE ng/mL (ref <100)
Pentabarbital: NEGATIVE ng/mL (ref <100)
Phenobarbital: >20000 ng/mL — ABNORMAL HIGH (ref <100)
Secobarbital: NEGATIVE ng/mL (ref <100)

## 2016-01-24 LAB — BENZODIAZEPINES (GC/LC/MS), URINE
Alprazolam metabolite (GC/LC/MS), ur confirm: 107 ng/mL — ABNORMAL HIGH (ref <25)
Clonazepam metabolite (GC/LC/MS), ur confirm: NEGATIVE ng/mL (ref <25)
Flurazepam metabolite (GC/LC/MS), ur confirm: NEGATIVE ng/mL (ref <50)
Lorazepam (GC/LC/MS), ur confirm: NEGATIVE ng/mL (ref <50)
Midazolam (GC/LC/MS), ur confirm: NEGATIVE ng/mL (ref <50)
Nordiazepam (GC/LC/MS), ur confirm: NEGATIVE ng/mL (ref <50)
Oxazepam (GC/LC/MS), ur confirm: NEGATIVE ng/mL (ref <50)
Temazepam (GC/LC/MS), ur confirm: NEGATIVE ng/mL (ref <50)
Triazolam metabolite (GC/LC/MS), ur confirm: NEGATIVE ng/mL (ref <50)

## 2016-04-13 ENCOUNTER — Other Ambulatory Visit: Payer: Self-pay | Admitting: Family Medicine

## 2016-04-25 ENCOUNTER — Other Ambulatory Visit: Payer: Self-pay | Admitting: Family Medicine

## 2016-04-25 ENCOUNTER — Encounter: Payer: Self-pay | Admitting: Adult Health

## 2016-04-25 ENCOUNTER — Ambulatory Visit (INDEPENDENT_AMBULATORY_CARE_PROVIDER_SITE_OTHER): Payer: Medicare Other | Admitting: Adult Health

## 2016-04-25 VITALS — BP 124/76 | HR 60 | Ht 69.0 in | Wt 188.0 lb

## 2016-04-25 DIAGNOSIS — Z5181 Encounter for therapeutic drug level monitoring: Secondary | ICD-10-CM

## 2016-04-25 DIAGNOSIS — R569 Unspecified convulsions: Secondary | ICD-10-CM | POA: Diagnosis not present

## 2016-04-25 MED ORDER — TOPIRAMATE 50 MG PO TABS: ORAL_TABLET | ORAL | 3 refills | Status: DC

## 2016-04-25 MED ORDER — PHENOBARBITAL 64.8 MG PO TABS: ORAL_TABLET | ORAL | 5 refills | Status: DC

## 2016-04-25 NOTE — Patient Instructions (Signed)
Continue phenobarbital and topamax Blood work today If you have any seizure events please let us know.

## 2016-04-25 NOTE — Progress Notes (Signed)
PATIENT: Richard Glass DOB: [birth year]  REASON FOR VISIT: follow up- seizures HISTORY FROM: patient  HISTORY OF PRESENT ILLNESS: Richard Glass is a 45 year old male with a history of seizures. He returns today for follow-up. He continues to take phenobarbital and Topamax. He denies any seizure events. He operates a Teacher, music without difficulty. He lives with his mother.Able to complete all ADLs independently. Denies any changes with his gait or balance. Denies any changes with mood or behavior. No new neurological symptoms. Returns today for an evaluation.  HISTORY 04/26/15 (MM):  Richard Glass is a 45 year old male with a history of seizures. He returns today for follow-up. He is currently taking phenobarbital and Topamax and tolerating these medications well. Denies any additional seizure events since the last visit. Denies any changes with his gait or balance. He is able to complete all ADLs independently. He operates a Teacher, music without difficulty. He continues to have some discomfort in his lower back this is being managed by his primary care provider. He denies any new neurological symptoms. He returns today for an evaluation   HISTORY 10/26/14: Richard Glass is a 45 year old right-handed white male with a history of seizures. He has done very well since last seen on phenobarbital and Topamax, he has not had any recurrence of seizures. However, within the last month he has developed left-sided back pain and pain down the left leg to the foot with some numbness and tingly sensations. He has some slight tingling in the right foot, but no pain on that side. He has had increased pain with weightbearing, difficulty with walking. He is wearing a lumbar support brace without much benefit. He has hydrocodone to take for pain, but this is not completely taking away the discomfort. He comes to this office for an evaluation. He believes that may be some slight weakness of the left leg.   REVIEW OF  SYSTEMS: Out of a complete 14 system review of symptoms, the patient complains only of the following symptoms, and all other reviewed systems are negative.  Ear pain, cough, frequent waking, restless leg, back pain  ALLERGIES: No Known Allergies  HOME MEDICATIONS: Outpatient Medications Prior to Visit  Medication Sig Dispense Refill  . AFLURIA PRESERVATIVE FREE 0.5 ML SUSY     . ALPRAZolam (XANAX) 0.5 MG tablet Take 1 tablet (0.5 mg total) by mouth 2 (two) times daily as needed (anxiety). 60 tablet 2  . cetirizine (ZYRTEC) 10 MG tablet Take 10 mg by mouth daily.      . COMBIVENT RESPIMAT 20-100 MCG/ACT AERS respimat INHALE 2 PUFFS INTO THE LUNGS EVERY 6 (SIX) HOURS. 1 Inhaler 5  . Elastic Bandages & Supports (WRIST SPLINT/COCK-UP/LEFT L) MISC Wear nightly on left wrist. 1 each 0  . fluticasone (FLONASE) 50 MCG/ACT nasal spray PLACE 2 SPRAYS INTO BOTH NOSTRILS DAILY. 16 g 3  . gabapentin (NEURONTIN) 300 MG capsule TAKE 3 CAPSULES (900 MG TOTAL) BY MOUTH 3 (THREE) TIMES DAILY. 270 capsule 2  . HYDROcodone-acetaminophen (NORCO/VICODIN) 5-325 MG tablet Take 1 tablet by mouth every 6 (six) hours as needed. 100 tablet 0  . Melatonin 3 MG TABS Take 1 tablet (3 mg total) by mouth at bedtime. 90 tablet 1  . nortriptyline (PAMELOR) 25 MG capsule Take 1 capsule (25 mg total) by mouth at bedtime. 30 capsule 2  . PHENobarbital (LUMINAL) 64.8 MG tablet TAKE 3 TABLETS BY MOUTH EVERY DAY AT BEDTIME 270 tablet 1  . Spacer/Aero-Holding Chambers [facility]) MISC Inhale 2 puffs  into the lungs every 4 (four) hours as needed. For use with MDI 1 each 2  . topiramate (TOPAMAX) 50 MG tablet Take one tablet in the morning and two tablets in the evening 270 tablet 3   No facility-administered medications prior to visit.     PAST MEDICAL HISTORY: Past Medical History:  Diagnosis Date  . 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  . Anxiety   .  Asthma   . Chronic low back pain   . Degenerative arthritis   . Dyslipidemia   . Headache(784.0)   . Restless legs syndrome (RLS)   . Sciatica of left side 10/26/2014  . Seizures ([place])   . Sinusitis chronic, frontal   . 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 [place]   struck by golf ball  . NASAL FRACTURE SURGERY  1997  . SHOULDER SURGERY Right 2012   Rotator cuff surgery  . UVULOPALATOPHARYNGOPLASTY (UPPP)/TONSILLECTOMY/SEPTOPLASTY  2001    FAMILY HISTORY: Family History  Problem Relation Age of Onset  . Cancer Father     lung  . Heart disease Father   . Hypertension Mother   . Seizures Neg Hx     SOCIAL HISTORY: Social History   Social History  . Marital status: Single    Spouse name: N/A  . Number of children: 0  . Years of education: 12   Occupational History  . disabled    Social History Main Topics  . Smoking status: Former Smoker    Packs/day: 1.00    Years: 22.00    Types: Cigarettes    Quit date: 06/22/2012  . Smokeless tobacco: Current User    Types: Snuff  . Alcohol use No     Comment: none since 7/11  . Drug use: No  . Sexual activity: No   Other Topics Concern  . 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  Generalized: Well developed, in no acute distress   Neurological examination  Mentation: Alert oriented to time, place, history taking. Follows all commands speech and language fluent Cranial nerve II-XII: Pupils were equal round reactive to light. Extraocular movements were full, visual field were full on  confrontational test. Facial sensation and strength were normal. Uvula tongue midline. Head turning and shoulder shrug  were normal and symmetric. Motor: The motor testing reveals 5 over 5 strength of all 4 extremities. Good symmetric motor tone is noted throughout.  Sensory: Sensory testing is intact to soft touch on all 4 extremities. No evidence of extinction is noted.  Coordination: Cerebellar testing reveals good finger-nose-finger and heel-to-shin bilaterally.  Gait and station: Gait is normal. Tandem gait is normal. Romberg is negative. No drift is seen.  Reflexes: Deep tendon reflexes are symmetric and normal bilaterally.   DIAGNOSTIC DATA (LABS, IMAGING, TESTING) - I reviewed patient records, labs, notes, testing and imaging myself where available.  Lab Results  Component Value Date  WBC 9.9 04/26/2015   HGB 14.3 10/26/2014   HCT 43.9 04/26/2015   MCV 92 04/26/2015   PLT 299 04/26/2015      Component Value Date/Time   NA 140 04/26/2015 0931   K 4.3 04/26/2015 0931   CL 102 04/26/2015 0931   CO2 21 04/26/2015 0931   GLUCOSE 80 04/26/2015 0931   GLUCOSE 87 11/30/2013 1122   BUN 7 04/26/2015 0931   CREATININE 0.83 04/26/2015 0931   CREATININE 0.76 11/30/2013 1122   CALCIUM 9.5 04/26/2015 0931   PROT 6.9 04/26/2015 0931   ALBUMIN 4.6 04/26/2015 0931   AST 12 04/26/2015 0931   ALT 14 04/26/2015 0931   ALKPHOS 102 04/26/2015 0931   BILITOT <0.2 04/26/2015 0931   GFRNONAA 106 04/26/2015 0931   GFRAA 122 04/26/2015 0931   Lab Results  Component Value Date   CHOL 155 07/06/2013   HDL 42 07/06/2013   LDLCALC 86 07/06/2013   LDLDIRECT 90 06/18/2011   TRIG 133 07/06/2013   CHOLHDL 3.7 07/06/2013         ASSESSMENT AND PLAN 45 y.o. year old male  has a past medical history of Abnormal head CT (05/2011); Anxiety; Asthma; Chronic low back pain; Degenerative arthritis; Dyslipidemia; Headache(784.0); Restless legs syndrome (RLS); Sciatica of left side (10/26/2014);  Seizures ([place]); Sinusitis chronic, frontal; and Traumatic cerebral hemorrhage ([place]) (1975). here with:  1. Seizures  Overall the patient is doing well. He will continue on phenobarbital and Topamax. I will check blood work today. Patient has been advised that if he has any seizure events they should let us know.He will follow-up in one year with Dr. Jannifer Franklin or sooner if needed.      Ward Givens, MSN, NP-C 04/25/2016, 8:45 AM [facility] [address], [place], [place] [address] [phone]

## 2016-04-25 NOTE — Progress Notes (Signed)
I have read the note, and I agree with the clinical assessment and plan.  Richard Glass,Richard Glass   

## 2016-04-26 LAB — COMPREHENSIVE METABOLIC PANEL
ALT: 10 IU/L (ref 0–44)
AST: 12 IU/L (ref 0–40)
Albumin/Globulin Ratio: 1.8 (ref 1.2–2.2)
Albumin: 4.4 g/dL (ref 3.5–5.5)
Alkaline Phosphatase: 91 IU/L (ref 39–117)
BUN/Creatinine Ratio: 8 — ABNORMAL LOW (ref 9–20)
BUN: 7 mg/dL (ref 6–24)
Bilirubin Total: <0.2 mg/dL (ref 0.0–1.2)
CO2: 20 mmol/L (ref 18–29)
Calcium: 8.8 mg/dL (ref 8.7–10.2)
Chloride: 102 mmol/L (ref 96–106)
Creatinine, Ser: 0.92 mg/dL (ref 0.76–1.27)
GFR calc Af Amer: 114 mL/min/{1.73_m2} (ref >59)
GFR calc non Af Amer: 99 mL/min/{1.73_m2} (ref >59)
Globulin, Total: 2.4 g/dL (ref 1.5–4.5)
Glucose: 68 mg/dL (ref 65–99)
Potassium: 3.8 mmol/L (ref 3.5–5.2)
Sodium: 141 mmol/L (ref 134–144)
Total Protein: 6.8 g/dL (ref 6.0–8.5)

## 2016-04-26 LAB — CBC WITH DIFFERENTIAL/PLATELET
Basophils Absolute: 0 10*3/uL (ref 0.0–0.2)
Basos: 0 %
EOS (ABSOLUTE): 0.1 10*3/uL (ref 0.0–0.4)
Eos: 2 %
Hematocrit: 42 % (ref 37.5–51.0)
Hemoglobin: 13.9 g/dL (ref 12.6–17.7)
Immature Grans (Abs): 0 10*3/uL (ref 0.0–0.1)
Immature Granulocytes: 0 %
Lymphocytes Absolute: 2.9 10*3/uL (ref 0.7–3.1)
Lymphs: 35 %
MCH: 30 pg (ref 26.6–33.0)
MCHC: 33.1 g/dL (ref 31.5–35.7)
MCV: 91 fL (ref 79–97)
Monocytes Absolute: 0.7 10*3/uL (ref 0.1–0.9)
Monocytes: 8 %
Neutrophils Absolute: 4.5 10*3/uL (ref 1.4–7.0)
Neutrophils: 55 %
Platelets: 272 10*3/uL (ref 150–379)
RBC: 4.64 x10E6/uL (ref 4.14–5.80)
RDW: 14.3 % (ref 12.3–15.4)
WBC: 8.2 10*3/uL (ref 3.4–10.8)

## 2016-04-26 LAB — PHENOBARBITAL LEVEL: Phenobarbital, Serum: 31 ug/mL (ref 15–40)

## 2016-04-29 ENCOUNTER — Telehealth: Payer: Self-pay

## 2016-04-29 NOTE — Telephone Encounter (Signed)
-----   Message from Ward Givens, NP sent at 04/29/2016  7:41 AM EDT ----- Lab work unremarkable. Please call patient.

## 2016-04-29 NOTE — Telephone Encounter (Signed)
I spoke to patient and he is aware of results/.  

## 2016-05-09 ENCOUNTER — Ambulatory Visit (INDEPENDENT_AMBULATORY_CARE_PROVIDER_SITE_OTHER): Payer: Medicare Other | Admitting: Family Medicine

## 2016-05-09 ENCOUNTER — Encounter: Payer: Self-pay | Admitting: Family Medicine

## 2016-05-09 DIAGNOSIS — F411 Generalized anxiety disorder: Secondary | ICD-10-CM

## 2016-05-09 DIAGNOSIS — S8992XA Unspecified injury of left lower leg, initial encounter: Secondary | ICD-10-CM | POA: Diagnosis not present

## 2016-05-09 DIAGNOSIS — Z7189 Other specified counseling: Secondary | ICD-10-CM

## 2016-05-09 DIAGNOSIS — G8929 Other chronic pain: Secondary | ICD-10-CM

## 2016-05-09 DIAGNOSIS — M25562 Pain in left knee: Secondary | ICD-10-CM | POA: Diagnosis not present

## 2016-05-09 HISTORY — DX: Unspecified injury of left lower leg, initial encounter: S89.92XA

## 2016-05-09 MED ORDER — HYDROCODONE-ACETAMINOPHEN 5-325 MG PO TABS: 1.0000 | ORAL_TABLET | Freq: Four times a day (QID) | ORAL | 0 refills | Status: DC | PRN

## 2016-05-09 MED ORDER — ALPRAZOLAM 0.5 MG PO TABS: 0.5000 mg | ORAL_TABLET | Freq: Two times a day (BID) | ORAL | 2 refills | Status: DC | PRN

## 2016-05-09 NOTE — Progress Notes (Signed)
   Subjective:    Patient ID: Richard Glass, male    DOB: [birth year], 45 y.o.   MRN: [id number]  HPI 45 y/o male presents for follow up of chronic pain.  Chronic pain - patient continues to have low back/neck/radicular symptoms, taking Norco 5-325 mg 2-3 times per day, also taking Pamelor 25 mg daily and Gabapentin 900 mg TID, informed patient of recent UDS that was negative for Norco, patient was not able to explain the negative drug screen as he states that he takes daily, he reports that his last Norco was approximately 3 hours prior to this visit, he does report that the medications help him to function and perform his job  Left Knee Pain - Patient was performing yard work a few weeks ago and heard a "pop" in his knee, has immediate pain and swelling in his knee, no reported injury. Swelling has improved but he has continued to have pain with ambulation, + locking and clicking  Social - nonsmoker   Review of Systems  Constitutional: Negative for chills and fever.  Respiratory: Negative for chest tightness and shortness of breath.        Objective:   Physical Exam BP 131/82   Pulse 74   Temp 97.6 F (36.4 C) (Oral)   Ht 5\' 9"  (1.753 m)   Wt 188 lb 12.8 oz (85.6 kg)   BMI 27.88 kg/m   [id number] male, in distress with ambulation due to knee pain MSK: left knee - no swelling, no redness, crepitus present with flexion/extension, diffuse tenderness,  knee exam limited by pain, unable to appreciate end point with lachman due to pain not tolerating exam, good endpoints with varus/valgus stress test, +Mcmurray sign for pain and locking      Assessment & Plan:  Left knee injury Acute left knee pain. Exam concerning for possible meniscal injury. -MRI of knee ordered -prescription for knee brace provided -encouraged pain control with chronic Norco -ice 2-3 times per day  Anxiety state Patient continues to have anxiety. GAD 7 score of 9. -refill of Xanax 0.5 BID  provided -continue Pamelor  Encounter for chronic pain management UDS on 01/18/16 negative for Narcotics. Positive for Benzos and Barbiturates (appropriately). Patient in acute pain due to left knee injury (see separate note).  -recheck UDS today (last Norco was 3 hours prior to visit) -If UDS is appropriately will allow patient to pick up additional 2 months of prescriptions -continue Pamelor and Gabapentin, consider increasing Pamelor at next visit (not done today due to acute pain)

## 2016-05-09 NOTE — Patient Instructions (Signed)
It was nice to see you today.   Left knee pain - check MRI of knee, my nurse will schedule this for you.  I have refilled your pain medications for one month, I will call you after we get the urine drug screen completed.

## 2016-05-09 NOTE — Assessment & Plan Note (Signed)
Patient continues to have anxiety. GAD 7 score of 9. -refill of Xanax 0.5 BID provided -continue Pamelor

## 2016-05-09 NOTE — Assessment & Plan Note (Signed)
Acute left knee pain. Exam concerning for possible meniscal injury. -MRI of knee ordered -prescription for knee brace provided -encouraged pain control with chronic Norco -ice 2-3 times per day

## 2016-05-09 NOTE — Assessment & Plan Note (Signed)
UDS on 01/18/16 negative for Narcotics. Positive for Benzos and Barbiturates (appropriately). Patient in acute pain due to left knee injury (see separate note).  -recheck UDS today (last Norco was 3 hours prior to visit) -If UDS is appropriately will allow patient to pick up additional 2 months of prescriptions -continue Pamelor and Gabapentin, consider increasing Pamelor at next visit (not done today due to acute pain)

## 2016-05-10 ENCOUNTER — Other Ambulatory Visit: Payer: Self-pay | Admitting: Family Medicine

## 2016-05-16 ENCOUNTER — Ambulatory Visit (HOSPITAL_COMMUNITY)
Admission: RE | Admit: 2016-05-16 | Discharge: 2016-05-16 | Disposition: A | Discharge status: Home / Self Care | Payer: Medicare Other | Source: Ambulatory Visit | Attending: Family Medicine | Admitting: Family Medicine

## 2016-05-16 DIAGNOSIS — M2242 Chondromalacia patellae, left knee: Secondary | ICD-10-CM | POA: Diagnosis not present

## 2016-05-16 DIAGNOSIS — M7122 Synovial cyst of popliteal space [Baker], left knee: Secondary | ICD-10-CM | POA: Insufficient documentation

## 2016-05-16 DIAGNOSIS — M899 Disorder of bone, unspecified: Secondary | ICD-10-CM | POA: Diagnosis not present

## 2016-05-16 DIAGNOSIS — M25562 Pain in left knee: Secondary | ICD-10-CM | POA: Insufficient documentation

## 2016-05-16 LAB — PAIN MGMT, PROFILE 5 W/O MEDMATCH U
Alphahydroxyalprazolam: 86 ng/mL — ABNORMAL HIGH (ref <25)
Alphahydroxymidazolam: NEGATIVE ng/mL (ref <50)
Alphahydroxytriazolam: NEGATIVE ng/mL (ref <50)
Aminoclonazepam: NEGATIVE ng/mL (ref <25)
Amobarbital: NEGATIVE ng/mL (ref <100)
Amphetamines: NEGATIVE ng/mL (ref <500)
Barbiturates: POSITIVE ng/mL — AB (ref <300)
Benzodiazepines: POSITIVE ng/mL — AB (ref <100)
Butalbital: NEGATIVE ng/mL (ref <100)
Cocaine Metabolite: NEGATIVE ng/mL (ref <150)
Codeine: NEGATIVE ng/mL (ref <50)
Creatinine: 49.6 mg/dL (ref >=20.0)
Hydrocodone: 290 ng/mL — ABNORMAL HIGH (ref <50)
Hydromorphone: 195 ng/mL — ABNORMAL HIGH (ref <50)
Hydroxyethylflurazepam: NEGATIVE ng/mL (ref <50)
Lorazepam: NEGATIVE ng/mL (ref <50)
Marijuana Metabolite: NEGATIVE ng/mL (ref <20)
Methadone Metabolite: NEGATIVE ng/mL (ref <100)
Morphine: NEGATIVE ng/mL (ref <50)
Nordiazepam: NEGATIVE ng/mL (ref <50)
Norhydrocodone: 1187 ng/mL — ABNORMAL HIGH (ref <50)
Opiates: POSITIVE ng/mL — AB (ref <100)
Oxazepam: NEGATIVE ng/mL (ref <50)
Oxidant: NEGATIVE ug/mL (ref <200)
Oxycodone: NEGATIVE ng/mL (ref <100)
Pentobarbital: NEGATIVE ng/mL (ref <100)
Phenobarbital: 16589 ng/mL — ABNORMAL HIGH (ref <100)
Secobarbital: NEGATIVE ng/mL (ref <100)
Temazepam: NEGATIVE ng/mL (ref <50)
pH: 6.46 (ref 4.5–9.0)

## 2016-05-17 ENCOUNTER — Telehealth: Payer: Self-pay | Admitting: Family Medicine

## 2016-05-17 DIAGNOSIS — D492 Neoplasm of unspecified behavior of bone, soft tissue, and skin: Secondary | ICD-10-CM

## 2016-05-17 DIAGNOSIS — D169 Benign neoplasm of bone and articular cartilage, unspecified: Secondary | ICD-10-CM | POA: Insufficient documentation

## 2016-05-17 HISTORY — DX: Neoplasm of unspecified behavior of bone, soft tissue, and skin: D49.2

## 2016-05-17 NOTE — Assessment & Plan Note (Signed)
Referral to orthopedic oncology.

## 2016-05-17 NOTE — Telephone Encounter (Signed)
Spoke to patient about MRI results showing tumor in left fibula. Will refer to orthopedic oncology.

## 2016-05-17 NOTE — Telephone Encounter (Signed)
Attempted to contact patient, left message that I would call back later.  Note: spoke with on call physician from [facility]). He recommended referral to Dr. Gwyndolyn Saxon Ward or Dr. Magda Bernheim (orthopedic oncologists)

## 2016-05-21 ENCOUNTER — Ambulatory Visit (INDEPENDENT_AMBULATORY_CARE_PROVIDER_SITE_OTHER): Payer: Medicare Other | Admitting: *Deleted

## 2016-05-21 DIAGNOSIS — Z23 Encounter for immunization: Secondary | ICD-10-CM | POA: Diagnosis not present

## 2016-05-23 ENCOUNTER — Other Ambulatory Visit: Payer: Self-pay | Admitting: Neurology

## 2016-05-24 ENCOUNTER — Ambulatory Visit: Payer: Medicare Other

## 2016-05-31 ENCOUNTER — Ambulatory Visit: Payer: Medicare Other | Admitting: Family Medicine

## 2016-06-20 ENCOUNTER — Other Ambulatory Visit: Payer: Self-pay | Admitting: *Deleted

## 2016-06-20 NOTE — Telephone Encounter (Signed)
Requesting 90 day supply.

## 2016-06-21 MED ORDER — GABAPENTIN 300 MG PO CAPS: 900.0000 mg | ORAL_CAPSULE | Freq: Three times a day (TID) | ORAL | 5 refills | Status: DC

## 2016-06-21 MED ORDER — FLUTICASONE PROPIONATE 50 MCG/ACT NA SUSP: 2.0000 | Freq: Every day | NASAL | 3 refills | Status: DC

## 2016-07-12 ENCOUNTER — Other Ambulatory Visit: Payer: Self-pay | Admitting: *Deleted

## 2016-07-12 MED ORDER — FLUTICASONE PROPIONATE 50 MCG/ACT NA SUSP: 2.0000 | Freq: Every day | NASAL | 3 refills | Status: DC

## 2016-07-12 NOTE — Telephone Encounter (Signed)
s requesting 90 day supply

## 2016-07-26 ENCOUNTER — Encounter: Payer: Self-pay | Admitting: Family Medicine

## 2016-07-26 ENCOUNTER — Ambulatory Visit (INDEPENDENT_AMBULATORY_CARE_PROVIDER_SITE_OTHER): Payer: Medicare Other | Admitting: Family Medicine

## 2016-07-26 VITALS — BP 137/82 | HR 63 | Temp 97.6°F | Wt 188.0 lb

## 2016-07-26 DIAGNOSIS — M5440 Lumbago with sciatica, unspecified side: Secondary | ICD-10-CM

## 2016-07-26 DIAGNOSIS — D492 Neoplasm of unspecified behavior of bone, soft tissue, and skin: Secondary | ICD-10-CM

## 2016-07-26 DIAGNOSIS — G8929 Other chronic pain: Secondary | ICD-10-CM | POA: Diagnosis not present

## 2016-07-26 DIAGNOSIS — E785 Hyperlipidemia, unspecified: Secondary | ICD-10-CM | POA: Diagnosis not present

## 2016-07-26 DIAGNOSIS — R569 Unspecified convulsions: Secondary | ICD-10-CM

## 2016-07-26 DIAGNOSIS — Z23 Encounter for immunization: Secondary | ICD-10-CM

## 2016-07-26 DIAGNOSIS — F411 Generalized anxiety disorder: Secondary | ICD-10-CM

## 2016-07-26 LAB — COMPLETE METABOLIC PANEL WITH GFR
ALT: 9 U/L (ref 9–46)
AST: 11 U/L (ref 10–40)
Albumin: 4.5 g/dL (ref 3.6–5.1)
Alkaline Phosphatase: 72 U/L (ref 40–115)
BUN: 9 mg/dL (ref 7–25)
CO2: 21 mmol/L (ref 20–31)
Calcium: 9 mg/dL (ref 8.6–10.3)
Chloride: 106 mmol/L (ref 98–110)
Creat: 0.73 mg/dL (ref 0.60–1.35)
GFR, Est African American: >89 mL/min (ref >=60)
GFR, Est Non African American: >89 mL/min (ref >=60)
Glucose, Bld: 66 mg/dL (ref 65–99)
Potassium: 3.9 mmol/L (ref 3.5–5.3)
Sodium: 138 mmol/L (ref 135–146)
Total Bilirubin: 0.2 mg/dL (ref 0.2–1.2)
Total Protein: 7.1 g/dL (ref 6.1–8.1)

## 2016-07-26 LAB — LIPID PANEL
Cholesterol: 238 mg/dL — ABNORMAL HIGH (ref <200)
HDL: 41 mg/dL (ref >40)
LDL Cholesterol: 160 mg/dL — ABNORMAL HIGH (ref <100)
Total CHOL/HDL Ratio: 5.8 Ratio — ABNORMAL HIGH (ref <5.0)
Triglycerides: 187 mg/dL — ABNORMAL HIGH (ref <150)
VLDL: 37 mg/dL — ABNORMAL HIGH (ref <30)

## 2016-07-26 LAB — CBC WITH DIFFERENTIAL/PLATELET
Basophils Absolute: 0 cells/uL (ref 0–200)
Basophils Relative: 0 %
Eosinophils Absolute: 78 cells/uL (ref 15–500)
Eosinophils Relative: 1 %
HCT: 42.2 % (ref 38.5–50.0)
Hemoglobin: 14 g/dL (ref 13.2–17.1)
Lymphocytes Relative: 29 %
Lymphs Abs: 2262 cells/uL (ref 850–3900)
MCH: 30.2 pg (ref 27.0–33.0)
MCHC: 33.2 g/dL (ref 32.0–36.0)
MCV: 90.9 fL (ref 80.0–100.0)
MPV: 9.3 fL (ref 7.5–12.5)
Monocytes Absolute: 702 cells/uL (ref 200–950)
Monocytes Relative: 9 %
Neutro Abs: 4758 cells/uL (ref 1500–7800)
Neutrophils Relative %: 61 %
Platelets: 271 10*3/uL (ref 140–400)
RBC: 4.64 MIL/uL (ref 4.20–5.80)
RDW: 14.1 % (ref 11.0–15.0)
WBC: 7.8 10*3/uL (ref 3.8–10.8)

## 2016-07-26 MED ORDER — ALPRAZOLAM 0.5 MG PO TABS: 0.5000 mg | ORAL_TABLET | Freq: Two times a day (BID) | ORAL | 2 refills | Status: DC | PRN

## 2016-07-26 MED ORDER — HYDROCODONE-ACETAMINOPHEN 5-325 MG PO TABS: 1.0000 | ORAL_TABLET | Freq: Four times a day (QID) | ORAL | 0 refills | Status: DC | PRN

## 2016-07-26 MED ORDER — NORTRIPTYLINE HCL 25 MG PO CAPS: 25.0000 mg | ORAL_CAPSULE | Freq: Every day | ORAL | 2 refills | Status: DC

## 2016-07-26 MED ORDER — PNEUMOCOCCAL VAC POLYVALENT 25 MCG/0.5ML IJ INJ
0.5000 mL | INJECTION | INTRAMUSCULAR | Status: AC
  Administered 2016-07-26: 0.5 mL via INTRAMUSCULAR

## 2016-07-26 NOTE — Progress Notes (Signed)
   Subjective:    Patient ID: Richard Glass, male    DOB: 1971/07/09, 45 y.o.   MRN: [id number]  HPI 45 y/o male presents for routine follow up.  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 Dr. Leonides Schanz also recommended spine specialist evaluation due to low back pain with radiculopathy.   Anxiety Currently on Xanax 0.5 mg BID and Pamelor 25 mg Daily (not taking), symptoms are stable, occasional shakes  Seizures Currently on Phenobarbital and Topamax, follows with Neurology, no further seizure activity  Health Maintenance Up to date   Review of Systems     Objective:   Physical Exam BP 137/82   Pulse 63   Temp 97.6 F (36.4 C) (Oral)   Wt 188 lb (85.3 kg)   SpO2 99%   BMI 27.76 kg/m   Gen: pleasant male, NAD Cardiac: RRR, S1 and S2 present, no murmur Resp: CTAB, normal effort MSK: Lumbar - midline and left paraspinal tenderness, tenderness of left buttock, strength grossly 5/5 in bilateral LE, sensation to light touch decreased in left leg, diminished 1+ patellar reflex of the left, 2+ patellar reflex of the right, no clonus  PHQ 9 score of 6 (somewhat difficult) GAD 7 score of 10   Urine Drug Screen on 05/09/16 Positive for Barbiturates (on Phenobarbital), Benzodiazepines (on Xanax), and Opiates (on Norco) - appropriate drug screen  Lumbar spine xray [facility]) 06/17/16 -minimal degenerative changes  Chest xray [facility]) 06/17/16 -no acute cardiopulmonary process    Assessment & Plan:  Encounter for chronic  pain management Patient reports improved pain and function with Norco -3 month supply provided  Back pain of lumbar region with sciatica Patient continues to have low back pain with radicular symptoms. Patient interested in speaking with surgeon (may not be interested in surgery) -referral to spine specialist placed in EPIC -continue Norco and Gabapentin -start Pemelor to help with pain and anxiety  Anxiety state Anxiety stable. GAD 7 score of 10. PHQ 9 score of 6. 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  -3 month follow up xray  Convulsions No seizure activity reported. -continue Phenobarbital and Topamax.

## 2016-07-26 NOTE — Assessment & Plan Note (Signed)
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  -3 month follow up xray

## 2016-07-26 NOTE — Assessment & Plan Note (Signed)
No seizure activity reported. -continue Phenobarbital and Topamax.

## 2016-07-26 NOTE — Assessment & Plan Note (Signed)
Patient continues to have low back pain with radicular symptoms. Patient interested in speaking with surgeon (may not be interested in surgery) -referral to spine specialist placed in EPIC -continue Norco and Gabapentin -start Pemelor to help with pain and anxiety

## 2016-07-26 NOTE — Assessment & Plan Note (Signed)
Anxiety stable. GAD 7 score of 10. PHQ 9 score of 6. -continue Xanax 0.5 mg BID -start Pamelor daily (previously prescribed but patient not taking)

## 2016-07-26 NOTE — Assessment & Plan Note (Signed)
Patient reports improved pain and function with Norco -3 month supply provided

## 2016-07-26 NOTE — Patient Instructions (Signed)
It was nice to see you today.  I have given you refills of Norco and Xanax.  Please start Pamelor at night as this may help with pain and not sleeping well.  I will call you with your lab results.

## 2016-07-29 ENCOUNTER — Encounter: Payer: Self-pay | Admitting: Family Medicine

## 2016-08-02 ENCOUNTER — Ambulatory Visit: Payer: Medicare Other | Admitting: Family Medicine

## 2016-08-13 ENCOUNTER — Ambulatory Visit (INDEPENDENT_AMBULATORY_CARE_PROVIDER_SITE_OTHER): Payer: Medicare Other | Admitting: Family Medicine

## 2016-08-13 ENCOUNTER — Encounter: Payer: Self-pay | Admitting: Family Medicine

## 2016-08-13 ENCOUNTER — Ambulatory Visit (HOSPITAL_COMMUNITY)
Admission: RE | Admit: 2016-08-13 | Discharge: 2016-08-13 | Disposition: A | Discharge status: Home / Self Care | Payer: Medicare Other | Source: Ambulatory Visit | Attending: Family Medicine | Admitting: Family Medicine

## 2016-08-13 ENCOUNTER — Telehealth: Payer: Self-pay | Admitting: Family Medicine

## 2016-08-13 VITALS — BP 140/88 | HR 85 | Temp 97.4°F

## 2016-08-13 DIAGNOSIS — W19XXXA Unspecified fall, initial encounter: Secondary | ICD-10-CM

## 2016-08-13 DIAGNOSIS — M542 Cervicalgia: Secondary | ICD-10-CM

## 2016-08-13 DIAGNOSIS — M47892 Other spondylosis, cervical region: Secondary | ICD-10-CM | POA: Diagnosis not present

## 2016-08-13 DIAGNOSIS — M8580 Other specified disorders of bone density and structure, unspecified site: Secondary | ICD-10-CM | POA: Insufficient documentation

## 2016-08-13 MED ORDER — KETOROLAC TROMETHAMINE 60 MG/2ML IM SOLN
60.0000 mg | Freq: Once | INTRAMUSCULAR | Status: AC
  Administered 2016-08-13: 60 mg via INTRAMUSCULAR

## 2016-08-13 MED ORDER — MELOXICAM 15 MG PO TABS: 15.0000 mg | ORAL_TABLET | Freq: Every day | ORAL | 0 refills | Status: DC

## 2016-08-13 NOTE — Progress Notes (Signed)
    Subjective:  Richard Glass is a 45 y.o. male who presents to the [facility] today with a chief complaint of neck pain.   HPI:  Neck Pain Patient woke up with pain in his neck starting yesterday morning. Pain located at the midline in the back of his neck. Pain is sharp. Does not radiate. Pain worse when trying to move neck. No new numbness or weakness. No obvious precipitating events. Current pain medications do not help. He has chronic pain in his hip and leg that is at his baseline.   Fall Patient fell in waiting room. Says that he has chronic pain in his hip and that he was sitting for so long that the pain became severe. When he got up after his name was called, his leg gave out on him and he fell. No LOC. Patient did hit the back of his head and neck on the floor. No current headache. No weakness or numbness. Alert and oriented.   ROS: Per HPI  PMH: Smoking history reviewed.    Objective:  Physical Exam: BP 140/88   Pulse 85   Temp 97.4 F (36.3 C) (Oral)   Gen: NAD, resting comfortably CV: RRR with no murmurs appreciated Pulm: NWOB, CTAB with no crackles, wheezes, or rhonchi GI: Normal bowel sounds present. Soft, Nontender, Nondistended. MSK:  - Head: No obvious deformities or ecchymosis - Neck: Tender palpation over C spine. Limited ROM in all directions due to pain. - UE: Strength 5/5 in all fields.  Skin: warm, dry Neuro: Appears slow in general, though no focal deficits. CN2-12 intact. Alert and oriented x3.  Psych: Blunted affect and thought content  Assessment/Plan:  Cervical spine pain Unclear etiology for acute worsening. Has had imaging in the past which was negative. No red flag signs or symptoms. Will treat with course of NSAIDs. Will give shot of toradol today. Will obtain neck plain films. Strict return precautions reviewed. Follow up in 2 weeks for chronic pain visit with PCP.   Fall Seems to be mechanical in nature. No obvious sequela in office from fall.  Neurological intact without headache. Patient on several sedating medications which is likely contributing, would benefit from tapering off. Will defer this to PCP. Return precautions reviewed.   Algis Greenhouse. Jerline Pain, [place] Resident PGY-3 08/13/2016 12:19 PM

## 2016-08-13 NOTE — Telephone Encounter (Signed)
Called patient to discuss findings of neck film. Patient with degenerative changes which is likely contributing to his pain. Will continue with treatment plan decided on at today's visit.  Richard Glass. Jerline Pain, [place] Medicine Resident PGY-3 08/13/2016 4:34 PM

## 2016-08-13 NOTE — Assessment & Plan Note (Signed)
Unclear etiology for acute worsening. Has had imaging in the past which was negative. No red flag signs or symptoms. Will treat with course of NSAIDs. Will give shot of toradol today. Will obtain neck plain films. Strict return precautions reviewed. Follow up in 2 weeks for chronic pain visit with PCP.

## 2016-08-13 NOTE — Patient Instructions (Signed)
We will give you a shot of toradol today.  Please take 1 meloxicam daily starting tomorrow for 2 weeks.  Schedule a follow up with Dr Ree Kida if your symptoms are not improving.  Take care,  Dr Jerline Pain

## 2016-09-30 ENCOUNTER — Other Ambulatory Visit: Payer: Self-pay | Admitting: Family Medicine

## 2016-10-10 ENCOUNTER — Ambulatory Visit (INDEPENDENT_AMBULATORY_CARE_PROVIDER_SITE_OTHER): Payer: Medicare Other | Admitting: Family Medicine

## 2016-10-10 ENCOUNTER — Encounter: Payer: Self-pay | Admitting: Family Medicine

## 2016-10-10 DIAGNOSIS — F411 Generalized anxiety disorder: Secondary | ICD-10-CM | POA: Diagnosis not present

## 2016-10-10 DIAGNOSIS — G5601 Carpal tunnel syndrome, right upper limb: Secondary | ICD-10-CM | POA: Diagnosis not present

## 2016-10-10 DIAGNOSIS — M5440 Lumbago with sciatica, unspecified side: Secondary | ICD-10-CM | POA: Diagnosis not present

## 2016-10-10 DIAGNOSIS — M8580 Other specified disorders of bone density and structure, unspecified site: Secondary | ICD-10-CM

## 2016-10-10 DIAGNOSIS — R21 Rash and other nonspecific skin eruption: Secondary | ICD-10-CM

## 2016-10-10 DIAGNOSIS — M542 Cervicalgia: Secondary | ICD-10-CM

## 2016-10-10 DIAGNOSIS — G8929 Other chronic pain: Secondary | ICD-10-CM

## 2016-10-10 HISTORY — DX: Other specified disorders of bone density and structure, unspecified site: M85.80

## 2016-10-10 HISTORY — DX: Rash and other nonspecific skin eruption: R21

## 2016-10-10 MED ORDER — WRIST SPLINT LEFT/RIGHT MISC: 0 refills | Status: DC

## 2016-10-10 MED ORDER — WRIST SPLINT/COCK-UP/RIGHT L MISC: 0 refills | Status: DC

## 2016-10-10 MED ORDER — WRIST SPLINT/COCK-UP/LEFT M MISC: 0 refills | Status: DC

## 2016-10-10 MED ORDER — HYDROCODONE-ACETAMINOPHEN 5-325 MG PO TABS: 1.0000 | ORAL_TABLET | Freq: Four times a day (QID) | ORAL | 0 refills | Status: DC | PRN

## 2016-10-10 MED ORDER — CLONAZEPAM 0.5 MG PO TABS: 0.5000 mg | ORAL_TABLET | Freq: Two times a day (BID) | ORAL | 0 refills | Status: DC | PRN

## 2016-10-10 MED ORDER — HYDROCORTISONE 1 % EX LOTN: 1.0000 "application " | TOPICAL_LOTION | Freq: Two times a day (BID) | CUTANEOUS | 0 refills | Status: DC

## 2016-10-10 NOTE — Assessment & Plan Note (Signed)
Seen on cervical spine xray in 2017 -set up appointment for DEXA scan

## 2016-10-10 NOTE — Assessment & Plan Note (Signed)
Patient now with bilateral symptoms consistent with carpal tunnel. However, also may represent cervical spine issue given diffuse numbness in hand and forearm. -prescription for cock up splints bilaterally to be used at night. -prescription for standard volar wrist splint on right during the day (already has one for left) -continue Gabapentin -referral placed to orthopedic spine specialist

## 2016-10-10 NOTE — Assessment & Plan Note (Signed)
Rash consistent with allergic etiology. -trial of hydrocortisone cream to affected areas

## 2016-10-10 NOTE — Progress Notes (Signed)
Subjective:     Patient ID: Richard Glass, male   DOB: Oct 22, 1970, 46 y.o.   MRN: [id number]  HPI 46 y/o male presents for follow up.  Cervical Neck pain Currently on Norco 5-325, taking 3 times per day; also taking Gabapentin 900 mg TID, seen in late November for acute flair of neck pain, given Toradol shot at that time; reports that he was never contacted by spine specialist after previous referral. Continued neck pain with radiation to bilateral arms/hands.   Lumbar Back pain Continued pain (8/10 without meds, down to 6/10 with narcotics), radiation down the left side, no bladder/bowel incontinence  Carpel Tunnel Right side - wears wrist brace daily, some relief of symptoms however still reports pain/numbness of entire had that radiates up to forearm Left side - now having similar pain/numbness of entire left hand  Anxiety/Depression Started on Nortyptyline last visit, reports diarrhea with this medicion, no improvement with nerves/mood,  also takes Xanax 0.5 mg BID  Rash Bilateral arms Present for a few weeks, itchy   Osteopenia Seen on xray after last visit  Social Former smoker  Review of Systems  Constitutional: Negative for chills and diaphoresis.  Respiratory: Negative for shortness of breath.   Cardiovascular: Negative for chest pain.       Objective:   Physical Exam  BP 120/72   Pulse 75   Temp 97.7 F (36.5 C) (Oral)   Ht 5\' 9"  (1.753 m)   Wt 187 lb 9.6 oz (85.1 kg)   SpO2 98%   BMI 27.70 kg/m   Gen: pleasant male, NAD MSK: Cervical - midline and paraspinal tenderness, + Spurling bilaterally, decreased ROM due to pain/stiffness; Lumbar spine - midline and paraspinal tenderness Neuro: Strength 5/5 in all extremities, decreased sensation to light touch in hands and left LE, diminished patellar and achilles reflexes bilaterally Skin: erythema and excoriation of bilateral forearm Neuro: + Tinnel and Phalen bilaterally   MRI Cervical Spine  2016 IMPRESSION: Negative for central canal or foraminal narrowing. No finding to explain the patient's symptoms. Scattered, mild facet degenerative disease is noted.  MRI Lumbar Spine IMPRESSION:  This is a mildly abnormal MRI of the lumbar spine showing mild degenerative changes at L4-L5 and L5-S1 that do not lead to any nerve root impingement.   Xray cervical spine 2017  IMPRESSION: Diffuse osteopenia and degenerative change. No acute abnormality identified.  GAD 7 Score of 13 (10 in November 2017), not diffucult PHQ 9 Score of 6 (6 in November), not difficult    Assessment:     46 y/o male presents for follow up of multiple medical issues.     Plan:     Encounter for chronic pain management Refills of Norco provided. Continue Gabapentin. Stop Pamelor due to GI side effects.   Back pain of lumbar region with sciatica Pain/function improved with narcotics. Continue Gabapentin. -referral placed to orthopedics for further workup and evaluation   Cervical spine pain Continued neck pain with radicular symptoms.  -referral to orthopedic spine specialist for further evaluation  Rash and nonspecific skin eruption Rash consistent with allergic etiology. -trial of hydrocortisone cream to affected areas  Carpal tunnel syndrome Patient now with bilateral symptoms consistent with carpal tunnel. However, also may represent cervical spine issue given diffuse numbness in hand and forearm. -prescription for cock up splints bilaterally to be used at night. -prescription for standard volar wrist splint on right during the day (already has one for left) -continue Gabapentin -referral placed to orthopedic spine  specialist  Osteopenia Seen on cervical spine xray in 2017 -set up appointment for DEXA scan  Anxiety state Likely contributing to chronic pain.  GAD 7 Score of 13 (10 in November 2017), not diffucult PHQ 9 Score of 6 (6 in November), not difficult -transition xanax to  klonopin -stop Pamelor due to GI side effects and little benefit

## 2016-10-10 NOTE — Assessment & Plan Note (Signed)
Pain/function improved with narcotics. Continue Gabapentin. -referral placed to orthopedics for further workup and evaluation

## 2016-10-10 NOTE — Patient Instructions (Signed)
It was nice to see you today.   Wrist Pain - please wear the cock up splints at night, wear the standard wrist splints during the day.  I have given refills of your Norco.  I have started you in Klonopin (stop Xanax) for your nerves.   I have referred you to a spine doctor at [facility].   Please return in 2-3 weeks.

## 2016-10-10 NOTE — Assessment & Plan Note (Signed)
Refills of Norco provided. Continue Gabapentin. Stop Pamelor due to GI side effects.

## 2016-10-10 NOTE — Assessment & Plan Note (Signed)
Likely contributing to chronic pain.  GAD 7 Score of 13 (10 in November 2017), not diffucult PHQ 9 Score of 6 (6 in November), not difficult -transition xanax to klonopin -stop Pamelor due to GI side effects and little benefit

## 2016-10-10 NOTE — Assessment & Plan Note (Signed)
Continued neck pain with radicular symptoms.  -referral to orthopedic spine specialist for further evaluation

## 2016-10-14 ENCOUNTER — Other Ambulatory Visit: Payer: Self-pay | Admitting: Family Medicine

## 2016-10-18 ENCOUNTER — Ambulatory Visit
Admission: RE | Admit: 2016-10-18 | Discharge: 2016-10-18 | Disposition: A | Discharge status: Home / Self Care | Payer: Medicare Other | Source: Ambulatory Visit | Attending: Family Medicine | Admitting: Family Medicine

## 2016-10-18 DIAGNOSIS — M8580 Other specified disorders of bone density and structure, unspecified site: Secondary | ICD-10-CM

## 2016-10-21 ENCOUNTER — Encounter: Payer: Self-pay | Admitting: Family Medicine

## 2016-10-21 ENCOUNTER — Other Ambulatory Visit: Payer: Self-pay | Admitting: Family Medicine

## 2016-10-21 DIAGNOSIS — M8580 Other specified disorders of bone density and structure, unspecified site: Secondary | ICD-10-CM

## 2016-10-21 NOTE — Assessment & Plan Note (Signed)
Found on cervical spine xray 07/2016 Dexa 10/2016 in normal range

## 2016-10-22 NOTE — Progress Notes (Signed)
   Subjective:    Patient ID: Richard Glass, male    DOB: [birth year], 46 y.o.   MRN: [id number]  HPI 46 y/o male presents for follow up of Carpal Tunnel Syndrome and Anxiety  Bilateral Carpal Tunnel/Cervcial DDD Prescribed cock-up splints and volar wrist splint at last visit, taking Gabapentin 900 mg TID; reports mild improvement of symptoms (right>left) with cock up splints, only recently picked up volar splint, reports continued pain/numbness of all digits of bilateral hands that radiates up forearms.   Anxiety/Tremors Xanax transitioned to Klonopin 0.5 mg BID at last visit, reports nerves are worse, has tremor at rest that worsens with movement History of seizures - currently on Phenobarbital and Topamax.  Patient recently evaluated by spine surgeon and has upcoming lumbar MRI.    Review of Systems     Objective:   Physical Exam BP 130/80   Pulse 79   Temp 97.7 F (36.5 C)   Wt 186 lb 9.6 oz (84.6 kg)   BMI 27.56 kg/m   Gen: pleasant male, NAD Cardiac: RRR, S1 and S2 present, no murmur Resp: CTAB, normal effort MSK: Cervical - midline and paraspinal tenderness; Wrist/Hands - + Tinnel and Phalens bilaterally Neuro: decreased sensation to monofilament testing bilaterally (in hands and lower forearm), sensation to light touch decreased; resting tremor, no pill rolling, increased with intention (finger to nose testing), strength in bilateral UE 5/5  MRI Cervical Spine 03/2015 Negative for central canal or foraminal narrowing. No finding to explain the patient's symptoms. Scattered, mild facet degenerative disease is noted.      Assessment & Plan:  Carpal tunnel syndrome Mild improvement with cock up splints/volar splint bilaterally.  -continue therapy as only recently started -continue Gabapentin 900 mg TID -provided carpal tunnel/wrist exercises and strength exercises -consider steroid injection of carpal tunnel if no improvement at next visit.  Cervical spine  pain Likely contributing to symptoms of bilateral hand pain/numbness. MRI pending per orthopedic spine specialist.   Anxiety state Patient reports symptoms worse on Klonopin. Counseled that I will not be prescribing him Xanax. -increase Klonopin to 1 mg BID  Intention tremor Worsened with discontinuation of Xanax. 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  -increase Klonopin to 1 mg BID -patient counseled to return to neurology for further evaluation.

## 2016-10-25 ENCOUNTER — Ambulatory Visit (INDEPENDENT_AMBULATORY_CARE_PROVIDER_SITE_OTHER): Payer: Medicare Other | Admitting: Family Medicine

## 2016-10-25 DIAGNOSIS — M542 Cervicalgia: Secondary | ICD-10-CM | POA: Diagnosis not present

## 2016-10-25 DIAGNOSIS — G252 Other specified forms of tremor: Secondary | ICD-10-CM | POA: Diagnosis not present

## 2016-10-25 DIAGNOSIS — F411 Generalized anxiety disorder: Secondary | ICD-10-CM

## 2016-10-25 DIAGNOSIS — G5601 Carpal tunnel syndrome, right upper limb: Secondary | ICD-10-CM | POA: Diagnosis not present

## 2016-10-25 MED ORDER — CLONAZEPAM 1 MG PO TABS: 1.0000 mg | ORAL_TABLET | Freq: Two times a day (BID) | ORAL | 0 refills | Status: DC | PRN

## 2016-10-25 NOTE — Assessment & Plan Note (Signed)
Likely contributing to symptoms of bilateral hand pain/numbness. MRI pending per orthopedic spine specialist.

## 2016-10-25 NOTE — Assessment & Plan Note (Signed)
Worsened with discontinuation of Xanax. 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  -increase Klonopin to 1 mg BID -patient counseled to return to neurology for further evaluation.

## 2016-10-25 NOTE — Patient Instructions (Signed)
It was nice to see you today.   Nerves/Tremors - increase Klonopin to 1 mg twice daily, please make an appointment with [facility]. Phone: [phone]  Hand/Wrist pain - continue to wears the splints. Complete home exercises.  Return in one month to re-evaluate hand pain.

## 2016-10-25 NOTE — Assessment & Plan Note (Signed)
Mild improvement with cock up splints/volar splint bilaterally.  -continue therapy as only recently started -continue Gabapentin 900 mg TID -provided carpal tunnel/wrist exercises and strength exercises -consider steroid injection of carpal tunnel if no improvement at next visit.

## 2016-10-25 NOTE — Assessment & Plan Note (Signed)
Patient reports symptoms worse on Klonopin. Counseled that I will not be prescribing him Xanax. -increase Klonopin to 1 mg BID

## 2016-10-30 ENCOUNTER — Other Ambulatory Visit: Payer: Self-pay | Admitting: Adult Health

## 2016-10-31 NOTE — Telephone Encounter (Signed)
Has RV Dr. Jannifer Franklin in 04/2017.

## 2016-11-04 ENCOUNTER — Other Ambulatory Visit: Payer: Self-pay | Admitting: Family Medicine

## 2016-11-04 MED ORDER — PHENOBARBITAL 64.8 MG PO TABS: 194.4000 mg | ORAL_TABLET | Freq: Every day | ORAL | 5 refills | Status: DC

## 2016-11-04 NOTE — Telephone Encounter (Signed)
Fax confirmation received phenobarbital [facility] [phone]. sy

## 2016-11-04 NOTE — Telephone Encounter (Signed)
RN staff - please call in Phenobarbitol 64.8 mg three tablets QHS, dispense #90, refill #5, thanks

## 2016-11-06 NOTE — Telephone Encounter (Signed)
Rx called into patient pharmacy. 

## 2016-11-12 ENCOUNTER — Other Ambulatory Visit: Payer: Self-pay | Admitting: *Deleted

## 2016-11-12 MED ORDER — MELOXICAM 15 MG PO TABS: 15.0000 mg | ORAL_TABLET | Freq: Every day | ORAL | 1 refills | Status: DC

## 2016-11-12 NOTE — Telephone Encounter (Signed)
Refill for 90 day supply.  Martin, Tamika L, RN  

## 2016-11-18 NOTE — Progress Notes (Signed)
   Subjective:    Patient ID: Richard Glass, male    DOB: [birth year], 46 y.o.   MRN: [id number]  HPI 46 y/o male presents for follow up of bilateral carpal tunnel/hand pain.   Carpal Tunnel/Cervical DDD Wearing cock up splints at night, volar splints bilaterally during the day, taking Gabapentin 900 mg TID, still reports continued bilateral hand pain and numbness (left hand mostly of 1st-3rd digits; right hand - all digits but worse in 5th digit).    Lumbar DDD Had recent MRI, had epidural injection which is helping back pain   Insomnia Has trouble falling asleep, racing thoughts, hard to get comfortable (due to multiple areas of arthritis), no improvement with Tylenol PM  Anxiety/Tremors Klonopin increased to 1 mg BID at last visit, no improvement in tremors, had nightmares and stopped taking 2 weeks ago, reports tremors not associated with anxiety.   Social Nonsmoker  Review of Systems  Constitutional: Negative for chills and fatigue.  Respiratory: Negative for cough and shortness of breath.   Gastrointestinal: Negative for diarrhea, nausea and vomiting.       Objective:   Physical Exam BP 140/82   Pulse 68   Temp 98 F (36.7 C) (Oral)   Ht 5\' 9"  (1.753 m)   Wt 181 lb (82.1 kg)   SpO2 97%   BMI 26.73 kg/m   Gen: pleasant male, NAD Cardiac: RRR, S1 and S2 present, no murmur Resp: CTAB, normal effort Ext: Bilateral wrist + Tinnel and Phalen testing, left hand (numbness of 1st - 3rd digits to light touch); Right hand (numbness mostly of 5th distal digit to light touch) Neuro: resting essential tremor that is worse with intention (finger to nose), no pill rolling tremor  Procedure Note: Carpal Tunnel Injection Right Written and Verbal consent obtained. Discussed the risks and benefits of the procedure. Palmar aspect of wrist was prepped in a sterile fashion. 10 mg DepoMedrol and 0.25 cc Lidocaine without Epinephrine was injected in a distal direction at the distal  palmar wrist crease in line with the 4th digit using a 25 gauge 1.5 inch needle. Patient tolerated well. No complication. Hemostasis achieved. Band Aid applied.        Assessment & Plan:  Carpal tunnel syndrome Stable bilateral carpal tunnel pain (pain in left>right) -trial of steroid inject in left hand (see procedure note) -continue Gabapentin -continue cock up splints during the night and volar splints during the day  Intention tremor Tremor consistent with essential tremor + intention component. Likely related to previous TBI -trial of propranolol -stop Klonopin  Anxiety state Nightmares with Klonopin. Patient stopped medication. Anxiety stable. -trial off medications for time being  Insomnia Not improved with Klonopin (had nightmares). Patient unsure if he ever took Melatonin that was previously prescribed. -restart Melatonin and discussed sleep hygeine  Cervical spine pain Counseled patient that his upper extremity pain/numbness may be cervical spine related. Encouraged patient to follow up with his Orthopedic Surgeon for further workup.   Back pain of lumbar region with sciatica Patient had recent Epidural Injection per Orthopedics [facility]). Will send for records.

## 2016-11-21 ENCOUNTER — Ambulatory Visit (INDEPENDENT_AMBULATORY_CARE_PROVIDER_SITE_OTHER): Payer: Medicare Other | Admitting: Family Medicine

## 2016-11-21 DIAGNOSIS — G5601 Carpal tunnel syndrome, right upper limb: Secondary | ICD-10-CM | POA: Diagnosis not present

## 2016-11-21 DIAGNOSIS — G4700 Insomnia, unspecified: Secondary | ICD-10-CM

## 2016-11-21 DIAGNOSIS — M5440 Lumbago with sciatica, unspecified side: Secondary | ICD-10-CM

## 2016-11-21 DIAGNOSIS — M542 Cervicalgia: Secondary | ICD-10-CM

## 2016-11-21 DIAGNOSIS — F411 Generalized anxiety disorder: Secondary | ICD-10-CM

## 2016-11-21 DIAGNOSIS — G252 Other specified forms of tremor: Secondary | ICD-10-CM

## 2016-11-21 MED ORDER — PROPRANOLOL HCL 40 MG PO TABS: 40.0000 mg | ORAL_TABLET | Freq: Two times a day (BID) | ORAL | 1 refills | Status: DC

## 2016-11-21 MED ORDER — METHYLPREDNISOLONE ACETATE 40 MG/ML IJ SUSP
0.2500 mg | Freq: Once | INTRAMUSCULAR | Status: AC
  Administered 2016-11-21: 0.4 mg via INTRA_ARTICULAR

## 2016-11-21 MED ORDER — MELATONIN 3 MG PO TABS: 3.0000 mg | ORAL_TABLET | Freq: Every day | ORAL | 1 refills | Status: DC

## 2016-11-21 NOTE — Patient Instructions (Addendum)
It was nice to see you today.  Wrist pain - this may be coming from your neck, please make an appointment with you orthopedic doctor to discuss your neck.  You were given a wrist injection today, please return in one month to see if this helps your pain.   Trouble sleeping - start Melatonin 3 mg nightly, I have sent this to your pharmacy  Tremor - start Propranolol 40 mg twice daily  I will see you in one month.

## 2016-11-22 ENCOUNTER — Encounter: Payer: Self-pay | Admitting: Family Medicine

## 2016-11-22 NOTE — Assessment & Plan Note (Signed)
Stable bilateral carpal tunnel pain (pain in left>right) -trial of steroid inject in left hand (see procedure note) -continue Gabapentin -continue cock up splints during the night and volar splints during the day

## 2016-11-22 NOTE — Assessment & Plan Note (Signed)
Not improved with Klonopin (had nightmares). Patient unsure if he ever took Melatonin that was previously prescribed. -restart Melatonin and discussed sleep hygeine

## 2016-11-22 NOTE — Assessment & Plan Note (Signed)
Nightmares with Klonopin. Patient stopped medication. Anxiety stable. -trial off medications for time being

## 2016-11-22 NOTE — Assessment & Plan Note (Signed)
Counseled patient that his upper extremity pain/numbness may be cervical spine related. Encouraged patient to follow up with his Orthopedic Surgeon for further workup.

## 2016-11-22 NOTE — Assessment & Plan Note (Signed)
Tremor consistent with essential tremor + intention component. Likely related to previous TBI -trial of propranolol -stop Klonopin

## 2016-11-22 NOTE — Assessment & Plan Note (Signed)
Patient had recent Epidural Injection per Orthopedics [facility]). Will send for records.

## 2016-12-16 ENCOUNTER — Other Ambulatory Visit: Payer: Self-pay | Admitting: *Deleted

## 2016-12-16 MED ORDER — GABAPENTIN 300 MG PO CAPS: 900.0000 mg | ORAL_CAPSULE | Freq: Three times a day (TID) | ORAL | 1 refills | Status: DC

## 2016-12-16 NOTE — Telephone Encounter (Signed)
Refill request for 90 day supply.  Rolla Servidio L, RN  

## 2016-12-30 ENCOUNTER — Encounter: Payer: Self-pay | Admitting: Family Medicine

## 2016-12-30 DIAGNOSIS — M5440 Lumbago with sciatica, unspecified side: Secondary | ICD-10-CM

## 2016-12-30 NOTE — Progress Notes (Signed)
   Subjective:    Patient ID: Richard Glass, male    DOB: 01-27-1971, 46 y.o.   MRN: [id number]  HPI 46 y/o male presents for follow up of bilateral wrist/hand pain.  Bilateral wrist/hand pain (Carpal Tunnel vs Cervical Spine Disease) Wearing cock up splints at night, volar splints bilaterally during the day, taking Gabapentin 900 mg TID. Completed injection of left carpal tunnel on 11/21/16. Pain down to 3/10 in left hand (down from 8/10), pain in right hand/wrist currently 7/10.   Intention Tremor Started on Propranolol at last visit, little change in tremor, reports home BP in low 937'D systolic, reports new lightheadedness when standing  Insomnia Sleep Hygiene discussed last visit, started Malatonin, did not help at all  Chronic Pain Taking 3-4 tablets per day, most of pain in low back, radiates down left leg, being followed by Orhopedics, states that "left hip is lower than right" and needs surgery for repair. No bladder or bowel incontinence.   Social Nonsmoker  Review of Systems See above    Objective:   Physical Exam BP 130/80   Pulse 63   Temp 97.3 F (36.3 C) (Oral)   Ht 5\' 9"  (1.753 m)   Wt 184 lb (83.5 kg)   SpO2 97%   BMI 27.17 kg/m   Gen: pleasant male, NAD MSK: +Tinnel bilaterally, + Phalen bilaterally Neuro: decreased sensation of all digits of bilateral hands, essential tremor that worsens with intention (finger to nose testing)  Procedure Note: Carpal Tunnel Injection Right Written and Verbal consent obtained. Discussed the risks and benefits of the procedure. Palmar aspect of wrist was prepped in a sterile fashion. 10 mg DepoMedrol and 0.25 cc Lidocaine without Epinephrine was injected in a distal direction at the distal palmar wrist crease in line with the 4th digit using a 25 gauge 1.5 inch needle. Patient tolerated well. No complication. Hemostasis achieved. Band Aid applied.     Assessment & Plan:  Carpal tunnel syndrome Left hand improved s/p  steroid injection. Performed right hand steroid injection today (see procedure note) Continue cock-up splints at night and Gabapentin  Encounter for chronic pain management Pain still relatively uncontrolled (chronic low back pain, cervical neck pain, left shoulder pain, carpal tunnel) but improved with Norco. Patient reports improved function on this medication. -3 month supply provided to patient today   Intention tremor Not improved with Propranolol and had orthostatic symptoms.  -stop Propranolol -trial of Primidone (note that patient also taking Gabapentin, Topamax, and Phenobarbital which are all AED's).  -if not improved with Primidone consider referral back to Neurology who he follows with for hx. of seizures.   Insomnia Not improved with Melatonin. Stop this medication. Insomnia related to chronic pain and suspect that will only get control of sleep issues once all chronic pain issues have been addressed. Encouraged patient to continue to see Orthopedics for management of Lumbar and Cervical DDD.

## 2016-12-30 NOTE — Progress Notes (Signed)
Reviewed notes from [facility] (Scanned into chart)  Visit on 11/04/16 Chronic Neuropathic Pain Left LE - scheduled selective nerve root block of S1 (performed on 12/04/16)  Visit on 10/22/16 Chronic low back/sacral pain; xray in office showed DDD at L5-S1. MRI Lumbar Spine ordered.    MRI Lumbar 10/30/16 Small central L5-S1 disc extrusion with minimal cranial migration and associated annular fissure approaches descending left S1 nerve roots without contact or displacement. Minimal bulging disc height loss without spinal canal stenosis or neural foraminal narrowing.  Minimal bulging disc at L2-L3 through L4-L5 without spinal canal stenosis or neural foraminal narrowing.  MRI Sacrum 10/30/16 No fracture. Small lesion left sacral ala most likely a subchondral cyst/erosion adjacent to the sacroiliac joint.

## 2017-01-02 ENCOUNTER — Ambulatory Visit (INDEPENDENT_AMBULATORY_CARE_PROVIDER_SITE_OTHER): Payer: Medicare Other | Admitting: Family Medicine

## 2017-01-02 ENCOUNTER — Encounter: Payer: Self-pay | Admitting: Family Medicine

## 2017-01-02 DIAGNOSIS — G4700 Insomnia, unspecified: Secondary | ICD-10-CM | POA: Diagnosis not present

## 2017-01-02 DIAGNOSIS — G8929 Other chronic pain: Secondary | ICD-10-CM

## 2017-01-02 DIAGNOSIS — G252 Other specified forms of tremor: Secondary | ICD-10-CM | POA: Diagnosis not present

## 2017-01-02 DIAGNOSIS — G5601 Carpal tunnel syndrome, right upper limb: Secondary | ICD-10-CM

## 2017-01-02 MED ORDER — HYDROCODONE-ACETAMINOPHEN 5-325 MG PO TABS: 1.0000 | ORAL_TABLET | Freq: Four times a day (QID) | ORAL | 0 refills | Status: DC | PRN

## 2017-01-02 MED ORDER — METHYLPREDNISOLONE ACETATE 40 MG/ML IJ SUSP
0.2500 mg | Freq: Once | INTRAMUSCULAR | Status: AC
  Administered 2017-01-02: 0.4 mg via INTRAMUSCULAR

## 2017-01-02 MED ORDER — PRIMIDONE 50 MG PO TABS: 50.0000 mg | ORAL_TABLET | Freq: Every day | ORAL | 2 refills | Status: DC

## 2017-01-02 NOTE — Patient Instructions (Signed)
It was nice to see you today.  Carpal Tunnel - I am glad to hear that your left hand is feeling better after the injection last visit, We have injected your right hand today. Please follow up in 1-2 months to see how your symptoms are doing.  Tremor - stop Propranolol (blood pressure medication). Start Primidone 50 mg daily. We may need to increase this over time.  Insomnia - stop Melatonin as not helping, we will continue to work on your chronic pain issues.

## 2017-01-02 NOTE — Assessment & Plan Note (Signed)
Left hand improved s/p steroid injection. Performed right hand steroid injection today (see procedure note) Continue cock-up splints at night and Gabapentin

## 2017-01-02 NOTE — Assessment & Plan Note (Signed)
Not improved with Propranolol and had orthostatic symptoms.  -stop Propranolol -trial of Primidone (note that patient also taking Gabapentin, Topamax, and Phenobarbital which are all AED's).  -if not improved with Primidone consider referral back to Neurology who he follows with for hx. of seizures.

## 2017-01-02 NOTE — Addendum Note (Signed)
Addended by: Delray Alt C on: 01/02/2017 04:00 PM   Modules accepted: Orders

## 2017-01-02 NOTE — Assessment & Plan Note (Signed)
Pain still relatively uncontrolled (chronic low back pain, cervical neck pain, left shoulder pain, carpal tunnel) but improved with Norco. Patient reports improved function on this medication. -3 month supply provided to patient today

## 2017-01-02 NOTE — Assessment & Plan Note (Signed)
Not improved with Melatonin. Stop this medication. Insomnia related to chronic pain and suspect that will only get control of sleep issues once all chronic pain issues have been addressed. Encouraged patient to continue to see Orthopedics for management of Lumbar and Cervical DDD.

## 2017-01-18 ENCOUNTER — Other Ambulatory Visit: Payer: Self-pay | Admitting: Family Medicine

## 2017-01-18 DIAGNOSIS — G252 Other specified forms of tremor: Secondary | ICD-10-CM

## 2017-01-28 ENCOUNTER — Other Ambulatory Visit: Payer: Self-pay | Admitting: *Deleted

## 2017-01-28 ENCOUNTER — Telehealth: Payer: Self-pay | Admitting: Family Medicine

## 2017-01-28 DIAGNOSIS — G252 Other specified forms of tremor: Secondary | ICD-10-CM

## 2017-01-28 NOTE — Telephone Encounter (Signed)
Pre - op clearance form dropped off for at front desk for completion.  Verified that patient section of form has been completed.  Last DOS with PCP was 01/02/17  Placed form in red team folder to be completed by clinical staff.  Richard Glass

## 2017-01-30 NOTE — Telephone Encounter (Signed)
Form placed in PCP box 

## 2017-02-03 NOTE — Telephone Encounter (Signed)
Reviewed patient chart. Patient to have upcoming SI joint fusion. No history of major cardiac risk (no CAD, no Heart Failure). RCRI score of 0.4%. Patient able to perform over 4 METS without chest pain based on previous conversations in office. Patient is low risk for Intermediate Risk Surgery. No further workup including labs/ekg needed at this time. Completed Pre-op clearance form.

## 2017-02-03 NOTE — Telephone Encounter (Signed)
Tried to leave voice message for patient that surgical form was completed and faxed to Derl Barrow.  Original copy placed up front for pick up.  Derl Barrow, RN

## 2017-02-12 NOTE — Progress Notes (Signed)
   Subjective:    Patient ID: Richard Glass, male    DOB: Nov 17, 1970, 46 y.o.   MRN: [id number]  HPI 46 y/o male presents for routine follow up.  Bilateral Wrist Pain/Carpal Tunnel vs Cervical Spine Disease Now s/p bilateral carpal tunnel steroid injection, wearing cock-up splints at night Right Wrist - pain currently, reports little improvement, 7/10 pain Left Wrist - pain currently 7/10, no changes in activity  Intention Tremor Propranolol stopped at last visit due to orthostatic hypotension/no improvement in symptoms. Transitioned to Primidone 50 mg daily.  Patient reports that tremor has not improved, will often drop cups/silverware due to tremor.   Has upcoming lower spine surgery (Dr. Rolena Infante) Richard Glass.  Social Nonsmoker  Review of Systems See above    Objective:   Physical Exam BP 128/68   Pulse 64   Temp 98.1 F (36.7 C) (Oral)   Ht 5\' 9"  (1.753 m)   Wt 181 lb 6.4 oz (82.3 kg)   SpO2 97%   BMI 26.79 kg/m    Gen: pleasant male, NAD Cardiac: RRR, S1 and S2 present, no murmur Resp: CTAB, normal effort MSK: Cervical - midline and paraspinal tenderness, ROM limited due to pain/stiffness, + Spurling (shooting pain down left arm with head turned to right). Neuro: CN 2-12 intact, strength 5/5 in bilateral UE, decreased sensation to light touch over right arm/forearm and bilateral fingers (all fingers). Negative Tinnel, + Phalens test bilaterally  MRI Lumbar 10/30/16 Small central L5-S1 disc extrusion with minimal cranial migration and associated annular fissure approaches descending left S1 nerve roots without contact or displacement. Minimal bulging disc height loss without spinal canal stenosis or neural foraminal narrowing.  Minimal bulging disc at L2-L3 through L4-L5 without spinal canal stenosis or neural foraminal narrowing.  MRI Sacrum 10/30/16 No fracture. Small lesion left sacral ala most likely a subchondral cyst/erosion adjacent to the sacroiliac joint.         Assessment & Plan:  Intention tremor No improvement with Primidone 50 mg.  -increase Primidone to 100 mg daily -patient to schedule appointment with Neurology -will send message to Ward Givens NP and Margette Fast MD at Neurology to make them aware of patient.   Cervical spine pain Continue neck pain and radicular symptoms.  -will send for recent cervical spine imaging performed at [facility]. -continue Gabapentin -Will discuss patient with Dr. Rolena Infante (Orthopedics) based on imaging findings.   Carpal tunnel syndrome Right hand pain not improved with steroid injection. Pain in left hand has began to worsen again.  -attempting to get recent imaging records from Orthopedics. If cervical spin etiology is present would recommend that patient speak with his Orthopedic Surgeon. However, if imaging unremarkable would consider referral to hand surgery for possible carpal tunnel release.

## 2017-02-13 ENCOUNTER — Ambulatory Visit (INDEPENDENT_AMBULATORY_CARE_PROVIDER_SITE_OTHER): Payer: Medicare Other | Admitting: Family Medicine

## 2017-02-13 ENCOUNTER — Encounter: Payer: Self-pay | Admitting: Family Medicine

## 2017-02-13 ENCOUNTER — Telehealth: Payer: Self-pay | Admitting: *Deleted

## 2017-02-13 DIAGNOSIS — G5603 Carpal tunnel syndrome, bilateral upper limbs: Secondary | ICD-10-CM

## 2017-02-13 DIAGNOSIS — M542 Cervicalgia: Secondary | ICD-10-CM

## 2017-02-13 DIAGNOSIS — G252 Other specified forms of tremor: Secondary | ICD-10-CM | POA: Diagnosis not present

## 2017-02-13 MED ORDER — PRIMIDONE 50 MG PO TABS: 100.0000 mg | ORAL_TABLET | Freq: Every day | ORAL | 2 refills | Status: DC

## 2017-02-13 MED ORDER — DICLOFENAC SODIUM 1 % TD GEL: 4.0000 g | Freq: Four times a day (QID) | TRANSDERMAL | 1 refills | Status: DC

## 2017-02-13 NOTE — Assessment & Plan Note (Signed)
Continue neck pain and radicular symptoms.  -will send for recent cervical spine imaging performed at [facility]. -continue Gabapentin -Will discuss patient with Dr. Rolena Infante (Orthopedics) based on imaging findings.

## 2017-02-13 NOTE — Assessment & Plan Note (Signed)
Right hand pain not improved with steroid injection. Pain in left hand has began to worsen again.  -attempting to get recent imaging records from Orthopedics. If cervical spin etiology is present would recommend that patient speak with his Orthopedic Surgeon. However, if imaging unremarkable would consider referral to hand surgery for possible carpal tunnel release.

## 2017-02-13 NOTE — Telephone Encounter (Signed)
[facility] Diclofenac. PA completed online at www.covermymeds.com. PA approved via [employer] until 09/15/17. Reference number: [id number].  Derl Barrow, RN

## 2017-02-13 NOTE — Patient Instructions (Signed)
It was nice to see you today.  Bilateral Hand Pain - get records of spine imaging from [facility], I will call you once I have received these records and discussed with Dr. Rolena Infante.   Tremor - increase Primidone to 100 mg (take two fifty mg tablets daily). Return to Neurology.

## 2017-02-13 NOTE — Assessment & Plan Note (Signed)
No improvement with Primidone 50 mg.  -increase Primidone to 100 mg daily -patient to schedule appointment with Neurology -will send message to Ward Givens NP and Margette Fast MD at Neurology to make them aware of patient.

## 2017-02-26 NOTE — Progress Notes (Addendum)
   Subjective:    Patient ID: Richard Glass, male    DOB: 29-Oct-1970, 46 y.o.   MRN: [id number]  HPI 46 y/o male presents for follow up of chronic pain.  Intention Tremor Primidone increased to 100 mg at last visit. No improvement in shaking/tremor.   Cervical Neck Pain Patient prescribed Norco 5-325 Q6 prn and Gabapentin 900 mg TID. RN staff contacted the office of Dr. Rolena Infante and apparently no Cervical Spine imaging has been obtained as previously thought. Patient continues to have bilateral hand pain/numbness and left arm numbness.   Left hip/low back pain Has upcoming surgery on 7/5 with Dr. Rolena Infante  Social Nonsmoker  Review of Systems  Constitutional: Negative for chills and fatigue.  Respiratory: Negative for shortness of breath.   Cardiovascular: Negative for chest pain.       Objective:   Physical Exam BP 122/82   Pulse (!) 59   Temp 98.3 F (36.8 C) (Oral)   Ht 5\' 9"  (1.753 m)   Wt 180 lb 12.8 oz (82 kg)   SpO2 97%   BMI 26.70 kg/m   Gen: pleasant male, NAD MSK: Cervcial - diffuse midline/paraspinal/left trap tenderness, ROM decreased due to pain Neuro: UE - sensation to light touch decreased over left upper extremity. See previous exams for hand exam.       Assessment & Plan:  Cervical spine pain Patient continues to have cervical neck pain with radicular symptoms. No cervical imaging has been obtained by Dr. Rolena Infante (Orthopedics) as previously thought.  -check MRI cervical spine  Intention tremor Uncontrolled. Patient unable to get back into Neurology until August 2018. -increase Primidone to 100 mg BID  Encounter for chronic pain management Patient provided one month refill (to be filled in July) -patient to make appointment with new PCP in August

## 2017-02-28 ENCOUNTER — Ambulatory Visit (INDEPENDENT_AMBULATORY_CARE_PROVIDER_SITE_OTHER): Payer: Medicare Other | Admitting: Family Medicine

## 2017-02-28 ENCOUNTER — Ambulatory Visit: Payer: Medicare Other | Admitting: Family Medicine

## 2017-02-28 ENCOUNTER — Ambulatory Visit: Payer: Self-pay | Admitting: Physician Assistant

## 2017-02-28 ENCOUNTER — Encounter: Payer: Self-pay | Admitting: Family Medicine

## 2017-02-28 DIAGNOSIS — M542 Cervicalgia: Secondary | ICD-10-CM | POA: Diagnosis not present

## 2017-02-28 DIAGNOSIS — G252 Other specified forms of tremor: Secondary | ICD-10-CM

## 2017-02-28 DIAGNOSIS — G8929 Other chronic pain: Secondary | ICD-10-CM

## 2017-02-28 MED ORDER — HYDROCODONE-ACETAMINOPHEN 5-325 MG PO TABS: 1.0000 | ORAL_TABLET | Freq: Four times a day (QID) | ORAL | 0 refills | Status: DC | PRN

## 2017-02-28 MED ORDER — CYCLOBENZAPRINE HCL 10 MG PO TABS: 10.0000 mg | ORAL_TABLET | Freq: Three times a day (TID) | ORAL | 0 refills | Status: DC | PRN

## 2017-02-28 MED ORDER — PRIMIDONE 50 MG PO TABS: 100.0000 mg | ORAL_TABLET | Freq: Two times a day (BID) | ORAL | 1 refills | Status: DC

## 2017-02-28 NOTE — Assessment & Plan Note (Signed)
Patient provided one month refill (to be filled in July) -patient to make appointment with new PCP in August

## 2017-02-28 NOTE — Assessment & Plan Note (Signed)
Uncontrolled. Patient unable to get back into Neurology until August 2018. -increase Primidone to 100 mg BID

## 2017-02-28 NOTE — Assessment & Plan Note (Signed)
Patient continues to have cervical neck pain with radicular symptoms. No cervical imaging has been obtained by Dr. Rolena Infante (Orthopedics) as previously thought.  -check MRI cervical spine

## 2017-02-28 NOTE — Patient Instructions (Signed)
Best of luck with the surgery.  I have given you a refill of your pain medication. You will need to see you new doctor for a refill in the month of august.  I have also given you a prescription for flexeril (muslcel relaxant).

## 2017-03-08 ENCOUNTER — Ambulatory Visit (HOSPITAL_COMMUNITY): Payer: Medicare Other

## 2017-03-08 ENCOUNTER — Ambulatory Visit (HOSPITAL_COMMUNITY)
Admission: RE | Admit: 2017-03-08 | Discharge: 2017-03-08 | Disposition: A | Discharge status: Home / Self Care | Payer: Medicare Other | Source: Ambulatory Visit | Attending: Family Medicine | Admitting: Family Medicine

## 2017-03-08 DIAGNOSIS — M79641 Pain in right hand: Secondary | ICD-10-CM | POA: Diagnosis not present

## 2017-03-08 DIAGNOSIS — M79642 Pain in left hand: Secondary | ICD-10-CM | POA: Diagnosis present

## 2017-03-08 DIAGNOSIS — M1288 Other specific arthropathies, not elsewhere classified, other specified site: Secondary | ICD-10-CM | POA: Insufficient documentation

## 2017-03-08 DIAGNOSIS — R200 Anesthesia of skin: Secondary | ICD-10-CM | POA: Insufficient documentation

## 2017-03-08 DIAGNOSIS — M542 Cervicalgia: Secondary | ICD-10-CM | POA: Diagnosis not present

## 2017-03-11 NOTE — Pre-Procedure Instructions (Signed)
Richard Glass  03/11/2017      [id number] [name], [place] [address] [facility] [facility] [address] [place] [address] Phone: [phone] Fax: [phone]  [id number] [place], [place]. AT [address] [place] [address] Phone: [phone] Fax: [phone]  [id number] [place], [place] [address] [place] [address] Phone: [phone] Fax: [phone]    Your procedure is scheduled on Thursday, July 5.  Report to [facility] Admitting at 9:00 AM               Your surgery or procedure is scheduled for 11:00 AM   Call this number if you have problems the morning of surgery: [phone]                For any other questions, please call [phone], Monday - Friday 8 AM - 4 PM.   Remember:  Do not eat food or drink liquids after midnight Wednesday, July 4.  Take these medicines the morning of surgery with A SIP OF WATER : cetirizine (ZYRTEC), gabapentin (NEURONTIN), primidone (MYSOLINE),  topiramate (TOPAMAX).  May use fluticasone (FLONASE) nasal spray.               Take if needed: HYDROcodone-acetaminophen (NORCO/VICODIN).               1 Week prior to surgery STOP taking Aspirin, Aspirin Products (Goody Powder, Excedrin Migraine), Ibuprofen (Advil), Naproxen (Aleve),diclofenac sodium (VOLTAREN) gel, Vitamins and Herbal Products (ie Fish Oil). Special instructions:  [facility]- Preparing For Surgery  Before surgery, you can play an important role. Because skin is not sterile, your skin needs to be as free of germs as possible. You can reduce the number of germs on your skin by washing with CHG (chlorahexidine gluconate) Soap before surgery.  CHG is an antiseptic cleaner which kills germs and bonds with the skin to continue killing germs even after washing.  Please  do not use if you have an allergy to CHG or antibacterial soaps. If your skin becomes reddened/irritated stop using the CHG.  Do not shave (including legs and underarms) for at least 48 hours prior to first CHG shower. It is OK to shave your face.  Please follow these instructions carefully.   1. Shower the NIGHT BEFORE SURGERY and the MORNING OF SURGERY with CHG.   2. If you chose to wash your hair, wash your hair first as usual with your normal shampoo.  3. After you shampoo, rinse your hair and body thoroughly to remove the shampoo.  -- Wash your face  and private area with your soap, then rinse. 4. Use CHG as you would any other liquid soap. You can apply CHG directly to the skin and wash gently with a scrungie or a clean washcloth.   5. Apply the CHG Soap to your body ONLY FROM THE NECK DOWN.  Do not use on open wounds or open sores. Avoid contact with your eyes, ears, mouth and genitals (private parts). Wash genitals (private parts) with your normal soap.  6. Wash thoroughly, paying special attention to the area where your surgery will be performed.  7. Thoroughly rinse your body with warm water from the neck down.  8. DO NOT shower/wash with your normal soap after using and rinsing off the CHG Soap.  9. Pat yourself dry with a CLEAN TOWEL.   10. Wear CLEAN PAJAMAS   11. Place CLEAN SHEETS on your bed the night of your first shower and DO NOT SLEEP WITH PETS.  Day of Surgery:  Shower as Above Do not apply any deodorants/lotions, powders or cologne. Please wear clean clothes to the hospital/surgery center.    Do not wear jewelry, make-up or nail polish.  Do not shave 48 hours prior to surgery.  Men may shave face and neck.  Do not bring valuables to the hospital.  [facility] is not responsible for any belongings or valuables.  Contacts, dentures or bridgework may not be worn into surgery.  Leave your suitcase in the car.  After surgery it may be brought to your room.  For  patients admitted to the hospital, discharge time will be determined by your treatment team.  Please read over the following fact sheets that you were given: Pain Booklet, Patient Instructions for Mupirocin Application, Coughing and Deep Breathing, Surgical Site Infections.

## 2017-03-12 ENCOUNTER — Encounter (HOSPITAL_COMMUNITY)
Admission: RE | Admit: 2017-03-12 | Discharge: 2017-03-12 | Disposition: A | Discharge status: Home / Self Care | Payer: Medicare Other | Source: Ambulatory Visit | Attending: Orthopedic Surgery | Admitting: Orthopedic Surgery

## 2017-03-12 ENCOUNTER — Encounter (HOSPITAL_COMMUNITY): Payer: Self-pay

## 2017-03-12 DIAGNOSIS — Z01818 Encounter for other preprocedural examination: Secondary | ICD-10-CM | POA: Insufficient documentation

## 2017-03-12 LAB — CBC
HCT: 43.5 % (ref 39.0–52.0)
Hemoglobin: 14.3 g/dL (ref 13.0–17.0)
MCH: 30.4 pg (ref 26.0–34.0)
MCHC: 32.9 g/dL (ref 30.0–36.0)
MCV: 92.6 fL (ref 78.0–100.0)
Platelets: 273 10*3/uL (ref 150–400)
RBC: 4.7 MIL/uL (ref 4.22–5.81)
RDW: 13.4 % (ref 11.5–15.5)
WBC: 7.4 10*3/uL (ref 4.0–10.5)

## 2017-03-12 LAB — TYPE AND SCREEN
ABO/RH(D): B POS
Antibody Screen: NEGATIVE

## 2017-03-12 LAB — BASIC METABOLIC PANEL WITH GFR
Anion gap: 6 (ref 5–15)
BUN: 5 mg/dL — ABNORMAL LOW (ref 6–20)
CO2: 23 mmol/L (ref 22–32)
Calcium: 8.9 mg/dL (ref 8.9–10.3)
Chloride: 108 mmol/L (ref 101–111)
Creatinine, Ser: 0.79 mg/dL (ref 0.61–1.24)
GFR calc Af Amer: >60 mL/min (ref >60)
GFR calc non Af Amer: >60 mL/min (ref >60)
Glucose, Bld: 97 mg/dL (ref 65–99)
Potassium: 3.5 mmol/L (ref 3.5–5.1)
Sodium: 137 mmol/L (ref 135–145)

## 2017-03-12 LAB — SURGICAL PCR SCREEN
MRSA, PCR: NEGATIVE
Staphylococcus aureus: NEGATIVE

## 2017-03-12 LAB — ABO/RH: ABO/RH(D): B POS

## 2017-03-12 NOTE — Pre-Procedure Instructions (Addendum)
Richard Glass  03/12/2017      [id number] [name],  [address] [facility] [place] [address] [place] [address] Phone: [phone] Fax: [phone]  [id number] - [place], [place]. AT [address] [place] [address] Phone: [phone] Fax: [phone]  [id number] - [place], [place] [address] [place] [address] Phone: [phone] Fax: [phone]    Your procedure is scheduled on Thursday, July 5.  Report to [facility] Admitting at 9:00 AM               Your surgery or procedure is scheduled for 11:00 AM   Call this number if you have problems the morning of surgery: [phone]                For any other questions, please call [phone], Monday - Friday 8 AM - 4 PM.   Remember:  Do not eat food or drink liquids after midnight Wednesday, July 4.  Take these medicines the morning of surgery with A SIP OF WATER : cetirizine (ZYRTEC), gabapentin (NEURONTIN), primidone (MYSOLINE),  topiramate (TOPAMAX).  May use fluticasone (FLONASE) nasal spray.               Take if needed: HYDROcodone-acetaminophen (NORCO/VICODIN).               1 Week prior to surgery starting tomorrow 03/13/17 STOP taking Aspirin, Aspirin Products (Goody Powder, Excedrin Migraine), Ibuprofen (Advil), Naproxen (Aleve),diclofenac sodium (VOLTAREN) gel, Vitamins and Herbal Products (ie Fish Oil).  Special instructions:  [facility]- Preparing For Surgery  Before surgery, you can play an important role. Because skin is not sterile, your skin needs to be as free of germs as possible. You can reduce the number of germs on your skin by washing with CHG (chlorahexidine gluconate) Soap before surgery.  CHG is an antiseptic cleaner which kills germs and bonds with the skin to continue killing germs  even after washing.  Please do not use if you have an allergy to CHG or antibacterial soaps. If your skin becomes reddened/irritated stop using the CHG.  Do not shave (including legs and underarms) for at least 48 hours prior to first CHG shower. It is OK to shave your face.  Please follow these instructions carefully.   1. Shower the NIGHT BEFORE SURGERY and the MORNING OF SURGERY with CHG.   2. If you chose to wash your hair, wash your hair first as usual with your normal shampoo.  3. After you shampoo, rinse your hair and body thoroughly to remove the shampoo.  -- Wash your face  and private area with your soap, then rinse. 4. Use CHG as you would any other liquid soap. You can apply CHG directly to the skin and wash gently with a scrungie or a clean washcloth.   5. Apply the CHG Soap to your body ONLY FROM THE NECK DOWN.  Do not use on open wounds or open sores. Avoid contact with your eyes, ears, mouth and genitals (private parts). Wash genitals (private parts) with your normal soap.  6. Wash thoroughly, paying special attention to the area where your surgery will be performed.  7. Thoroughly rinse your body with warm water from the neck down.  8. DO NOT shower/wash with your normal soap after using and rinsing off  the CHG Soap.  9. Pat yourself dry with a CLEAN TOWEL.   10. Wear CLEAN PAJAMAS   11. Place CLEAN SHEETS on your bed the night of your first shower and DO NOT SLEEP WITH PETS.  Day of Surgery:  Shower as Above Do not apply any deodorants/lotions, powders or cologne. Please wear clean clothes to the hospital/surgery center.    Do not wear jewelry, make-up or nail polish.  Do not shave 48 hours prior to surgery.  Men may shave face and neck.  Do not bring valuables to the hospital.  [facility] is not responsible for any belongings or valuables.  Contacts, dentures or bridgework may not be worn into surgery.  Leave your suitcase in the car.  After surgery it may be  brought to your room.  For patients admitted to the hospital, discharge time will be determined by your treatment team.  Please read over the following fact sheets that you were given: Pain Booklet, Patient Instructions for Mupirocin Application, Coughing and Deep Breathing, Surgical Site Infections.

## 2017-03-12 NOTE — Progress Notes (Signed)
Anesthesia Chart Review: chart reviewed at the request of Dr. Rolena Infante.   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 Former smoker. BMI 27.5  Medications include: Phenobarbital, primidone, Topamax  Preoperative labs reviewed.  If no changes, I anticipate pt can proceed with surgery as scheduled.   Willeen Cass, FNP-BC [facility] Short Stay Surgical [facility]/Anesthesiology Phone: [phone] 03/12/2017 4:07 PM

## 2017-03-20 ENCOUNTER — Encounter (HOSPITAL_COMMUNITY)
Admission: AD | Disposition: A | Discharge status: Home / Self Care | Payer: Self-pay | Source: Ambulatory Visit | Attending: Orthopedic Surgery

## 2017-03-20 ENCOUNTER — Inpatient Hospital Stay (HOSPITAL_COMMUNITY): Payer: Medicare Other | Admitting: Vascular Surgery

## 2017-03-20 ENCOUNTER — Inpatient Hospital Stay (HOSPITAL_COMMUNITY): Payer: Medicare Other | Admitting: Anesthesiology

## 2017-03-20 ENCOUNTER — Encounter (HOSPITAL_COMMUNITY): Payer: Self-pay | Admitting: *Deleted

## 2017-03-20 ENCOUNTER — Observation Stay (HOSPITAL_COMMUNITY): Payer: Medicare Other

## 2017-03-20 ENCOUNTER — Inpatient Hospital Stay (HOSPITAL_COMMUNITY)
Admission: AD | Admit: 2017-03-20 | Discharge: 2017-03-21 | DRG: 460 | Disposition: A | Discharge status: Home / Self Care | Payer: Medicare Other | Source: Ambulatory Visit | Attending: Orthopedic Surgery | Admitting: Orthopedic Surgery

## 2017-03-20 DIAGNOSIS — M8580 Other specified disorders of bone density and structure, unspecified site: Secondary | ICD-10-CM | POA: Diagnosis not present

## 2017-03-20 DIAGNOSIS — M533 Sacrococcygeal disorders, not elsewhere classified: Secondary | ICD-10-CM

## 2017-03-20 DIAGNOSIS — E785 Hyperlipidemia, unspecified: Secondary | ICD-10-CM | POA: Diagnosis not present

## 2017-03-20 DIAGNOSIS — G2581 Restless legs syndrome: Secondary | ICD-10-CM | POA: Diagnosis not present

## 2017-03-20 DIAGNOSIS — K219 Gastro-esophageal reflux disease without esophagitis: Secondary | ICD-10-CM | POA: Diagnosis present

## 2017-03-20 DIAGNOSIS — Z833 Family history of diabetes mellitus: Secondary | ICD-10-CM | POA: Diagnosis not present

## 2017-03-20 DIAGNOSIS — G252 Other specified forms of tremor: Secondary | ICD-10-CM | POA: Diagnosis present

## 2017-03-20 DIAGNOSIS — Z8249 Family history of ischemic heart disease and other diseases of the circulatory system: Secondary | ICD-10-CM | POA: Diagnosis not present

## 2017-03-20 DIAGNOSIS — R569 Unspecified convulsions: Secondary | ICD-10-CM | POA: Diagnosis not present

## 2017-03-20 DIAGNOSIS — J45909 Unspecified asthma, uncomplicated: Secondary | ICD-10-CM | POA: Diagnosis not present

## 2017-03-20 DIAGNOSIS — M5442 Lumbago with sciatica, left side: Principal | ICD-10-CM | POA: Diagnosis present

## 2017-03-20 DIAGNOSIS — Z981 Arthrodesis status: Secondary | ICD-10-CM

## 2017-03-20 DIAGNOSIS — F419 Anxiety disorder, unspecified: Secondary | ICD-10-CM | POA: Diagnosis present

## 2017-03-20 DIAGNOSIS — Z7951 Long term (current) use of inhaled steroids: Secondary | ICD-10-CM | POA: Diagnosis not present

## 2017-03-20 DIAGNOSIS — Z419 Encounter for procedure for purposes other than remedying health state, unspecified: Secondary | ICD-10-CM

## 2017-03-20 DIAGNOSIS — M1990 Unspecified osteoarthritis, unspecified site: Secondary | ICD-10-CM | POA: Diagnosis not present

## 2017-03-20 DIAGNOSIS — M549 Dorsalgia, unspecified: Secondary | ICD-10-CM | POA: Diagnosis present

## 2017-03-20 DIAGNOSIS — Z87891 Personal history of nicotine dependence: Secondary | ICD-10-CM

## 2017-03-20 DIAGNOSIS — J309 Allergic rhinitis, unspecified: Secondary | ICD-10-CM | POA: Diagnosis present

## 2017-03-20 HISTORY — DX: Sacrococcygeal disorders, not elsewhere classified: M53.3

## 2017-03-20 HISTORY — DX: Arthrodesis status: Z98.1

## 2017-03-20 HISTORY — PX: SACROILIAC JOINT FUSION: SHX6088

## 2017-03-20 SURGERY — SACROILIAC JOINT FUSION
Anesthesia: General | Site: Back | Laterality: Left

## 2017-03-20 MED ORDER — LIDOCAINE HCL (CARDIAC) 20 MG/ML IV SOLN
INTRAVENOUS | Status: DC | PRN
  Administered 2017-03-20: 80 mg via INTRAVENOUS

## 2017-03-20 MED ORDER — EPHEDRINE SULFATE-NACL 50-0.9 MG/10ML-% IV SOSY
PREFILLED_SYRINGE | INTRAVENOUS | Status: DC | PRN
  Administered 2017-03-20 (×2): 10 mg via INTRAVENOUS

## 2017-03-20 MED ORDER — LACTATED RINGERS IV SOLN: INTRAVENOUS | Status: DC

## 2017-03-20 MED ORDER — DEXAMETHASONE 4 MG PO TABS
4.0000 mg | ORAL_TABLET | Freq: Four times a day (QID) | ORAL | Status: AC
  Administered 2017-03-20 – 2017-03-21 (×3): 4 mg via ORAL
  Filled 2017-03-20 (×3): qty 1

## 2017-03-20 MED ORDER — SUGAMMADEX SODIUM 200 MG/2ML IV SOLN
INTRAVENOUS | Status: DC | PRN
  Administered 2017-03-20: 200 mg via INTRAVENOUS

## 2017-03-20 MED ORDER — ONDANSETRON HCL 4 MG/2ML IJ SOLN: 4.0000 mg | Freq: Four times a day (QID) | INTRAMUSCULAR | Status: DC | PRN

## 2017-03-20 MED ORDER — 0.9 % SODIUM CHLORIDE (POUR BTL) OPTIME
TOPICAL | Status: DC | PRN
  Administered 2017-03-20: 1000 mL

## 2017-03-20 MED ORDER — HYDROMORPHONE HCL 1 MG/ML IJ SOLN
INTRAMUSCULAR | Status: AC
  Filled 2017-03-20: qty 0.5

## 2017-03-20 MED ORDER — ARTIFICIAL TEARS OPHTHALMIC OINT
TOPICAL_OINTMENT | OPHTHALMIC | Status: AC
  Filled 2017-03-20: qty 3.5

## 2017-03-20 MED ORDER — PHENOBARBITAL 97.2 MG PO TABS
194.4000 mg | ORAL_TABLET | Freq: Every day | ORAL | Status: DC
  Administered 2017-03-20: 194.4 mg via ORAL

## 2017-03-20 MED ORDER — METHOCARBAMOL 500 MG PO TABS
500.0000 mg | ORAL_TABLET | Freq: Four times a day (QID) | ORAL | Status: DC | PRN
  Administered 2017-03-20: 500 mg via ORAL
  Filled 2017-03-20: qty 1

## 2017-03-20 MED ORDER — OXYCODONE HCL 5 MG PO TABS
ORAL_TABLET | ORAL | Status: AC
  Filled 2017-03-20: qty 2

## 2017-03-20 MED ORDER — PROPOFOL 10 MG/ML IV BOLUS
INTRAVENOUS | Status: AC
  Filled 2017-03-20: qty 20

## 2017-03-20 MED ORDER — TOPIRAMATE 25 MG PO TABS: 50.0000 mg | ORAL_TABLET | Freq: Every day | ORAL | Status: DC

## 2017-03-20 MED ORDER — CEFAZOLIN SODIUM-DEXTROSE 2-4 GM/100ML-% IV SOLN
INTRAVENOUS | Status: AC
  Filled 2017-03-20: qty 100

## 2017-03-20 MED ORDER — ONDANSETRON HCL 4 MG PO TABS: 4.0000 mg | ORAL_TABLET | Freq: Three times a day (TID) | ORAL | 0 refills | Status: DC | PRN

## 2017-03-20 MED ORDER — DEXAMETHASONE SODIUM PHOSPHATE 10 MG/ML IJ SOLN
INTRAMUSCULAR | Status: AC
  Filled 2017-03-20: qty 1

## 2017-03-20 MED ORDER — OXYCODONE-ACETAMINOPHEN 10-325 MG PO TABS: 1.0000 | ORAL_TABLET | ORAL | 0 refills | Status: DC | PRN

## 2017-03-20 MED ORDER — MORPHINE SULFATE (PF) 4 MG/ML IV SOLN: 2.0000 mg | INTRAVENOUS | Status: DC | PRN

## 2017-03-20 MED ORDER — TOPIRAMATE 100 MG PO TABS
100.0000 mg | ORAL_TABLET | Freq: Every day | ORAL | Status: DC
  Administered 2017-03-20: 100 mg via ORAL
  Filled 2017-03-20: qty 1

## 2017-03-20 MED ORDER — CEFAZOLIN SODIUM-DEXTROSE 1-4 GM/50ML-% IV SOLN
1.0000 g | Freq: Three times a day (TID) | INTRAVENOUS | Status: AC
  Administered 2017-03-20: 1 g via INTRAVENOUS
  Filled 2017-03-20: qty 50

## 2017-03-20 MED ORDER — ROCURONIUM BROMIDE 100 MG/10ML IV SOLN
INTRAVENOUS | Status: DC | PRN
  Administered 2017-03-20: 50 mg via INTRAVENOUS

## 2017-03-20 MED ORDER — LACTATED RINGERS IV SOLN
INTRAVENOUS | Status: DC | PRN
  Administered 2017-03-20 (×2): via INTRAVENOUS

## 2017-03-20 MED ORDER — ONDANSETRON HCL 4 MG/2ML IJ SOLN
INTRAMUSCULAR | Status: AC
  Filled 2017-03-20: qty 2

## 2017-03-20 MED ORDER — BUPIVACAINE-EPINEPHRINE (PF) 0.25% -1:200000 IJ SOLN
INTRAMUSCULAR | Status: DC | PRN
  Administered 2017-03-20: 25 mL via PERINEURAL
  Administered 2017-03-20: 5 mL via PERINEURAL

## 2017-03-20 MED ORDER — VECURONIUM BROMIDE 10 MG IV SOLR
INTRAVENOUS | Status: AC
  Filled 2017-03-20: qty 10

## 2017-03-20 MED ORDER — GABAPENTIN 300 MG PO CAPS
900.0000 mg | ORAL_CAPSULE | Freq: Three times a day (TID) | ORAL | Status: DC
  Administered 2017-03-20 – 2017-03-21 (×3): 900 mg via ORAL
  Filled 2017-03-20 (×3): qty 3

## 2017-03-20 MED ORDER — HYDROMORPHONE HCL 1 MG/ML IJ SOLN
0.2500 mg | INTRAMUSCULAR | Status: DC | PRN
  Administered 2017-03-20: 0.5 mg via INTRAVENOUS

## 2017-03-20 MED ORDER — ACETAMINOPHEN 10 MG/ML IV SOLN
INTRAVENOUS | Status: DC | PRN
  Administered 2017-03-20: 1000 mg via INTRAVENOUS

## 2017-03-20 MED ORDER — ACETAMINOPHEN 10 MG/ML IV SOLN
INTRAVENOUS | Status: AC
  Filled 2017-03-20: qty 100

## 2017-03-20 MED ORDER — LIDOCAINE HCL (CARDIAC) 20 MG/ML IV SOLN
INTRAVENOUS | Status: AC
  Filled 2017-03-20: qty 5

## 2017-03-20 MED ORDER — FLUTICASONE PROPIONATE 50 MCG/ACT NA SUSP
2.0000 | Freq: Every day | NASAL | Status: DC
  Administered 2017-03-21: 2 via NASAL
  Filled 2017-03-20: qty 16

## 2017-03-20 MED ORDER — OXYCODONE HCL 5 MG PO TABS
10.0000 mg | ORAL_TABLET | ORAL | Status: DC | PRN
  Administered 2017-03-20 – 2017-03-21 (×5): 10 mg via ORAL
  Filled 2017-03-20 (×4): qty 2

## 2017-03-20 MED ORDER — PHENOL 1.4 % MT LIQD: 1.0000 | OROMUCOSAL | Status: DC | PRN

## 2017-03-20 MED ORDER — SODIUM CHLORIDE 0.9% FLUSH: 3.0000 mL | INTRAVENOUS | Status: DC | PRN

## 2017-03-20 MED ORDER — GLYCOPYRROLATE 0.2 MG/ML IJ SOLN
INTRAMUSCULAR | Status: DC | PRN
  Administered 2017-03-20: 0.4 mg via INTRAVENOUS

## 2017-03-20 MED ORDER — THROMBIN 20000 UNITS EX SOLR
CUTANEOUS | Status: AC
  Filled 2017-03-20: qty 20000

## 2017-03-20 MED ORDER — SODIUM CHLORIDE 0.9 % IV SOLN: 250.0000 mL | INTRAVENOUS | Status: DC

## 2017-03-20 MED ORDER — ONDANSETRON HCL 4 MG/2ML IJ SOLN
INTRAMUSCULAR | Status: DC | PRN
  Administered 2017-03-20: 4 mg via INTRAVENOUS

## 2017-03-20 MED ORDER — FENTANYL CITRATE (PF) 250 MCG/5ML IJ SOLN
INTRAMUSCULAR | Status: AC
  Filled 2017-03-20: qty 5

## 2017-03-20 MED ORDER — PRIMIDONE 50 MG PO TABS
100.0000 mg | ORAL_TABLET | Freq: Two times a day (BID) | ORAL | Status: DC
  Administered 2017-03-20 – 2017-03-21 (×2): 100 mg via ORAL
  Filled 2017-03-20 (×2): qty 2

## 2017-03-20 MED ORDER — EPHEDRINE 5 MG/ML INJ
INTRAVENOUS | Status: AC
  Filled 2017-03-20: qty 10

## 2017-03-20 MED ORDER — PROPOFOL 10 MG/ML IV BOLUS
INTRAVENOUS | Status: DC | PRN
  Administered 2017-03-20: 200 mg via INTRAVENOUS

## 2017-03-20 MED ORDER — MORPHINE SULFATE (PF) 2 MG/ML IV SOLN: 2.0000 mg | INTRAVENOUS | Status: DC | PRN

## 2017-03-20 MED ORDER — DEXTROSE 5 % IV SOLN
INTRAVENOUS | Status: DC | PRN
  Administered 2017-03-20: 12:00:00 via INTRAVENOUS

## 2017-03-20 MED ORDER — CEFAZOLIN SODIUM-DEXTROSE 2-4 GM/100ML-% IV SOLN
2.0000 g | INTRAVENOUS | Status: AC
  Administered 2017-03-20: 2 g via INTRAVENOUS

## 2017-03-20 MED ORDER — METHOCARBAMOL 1000 MG/10ML IJ SOLN
500.0000 mg | Freq: Four times a day (QID) | INTRAMUSCULAR | Status: DC | PRN
  Filled 2017-03-20: qty 5

## 2017-03-20 MED ORDER — METHOCARBAMOL 500 MG PO TABS: 500.0000 mg | ORAL_TABLET | Freq: Three times a day (TID) | ORAL | 0 refills | Status: DC | PRN

## 2017-03-20 MED ORDER — BUPIVACAINE LIPOSOME 1.3 % IJ SUSP
INTRAMUSCULAR | Status: DC | PRN
  Administered 2017-03-20: 5 mL
  Administered 2017-03-20: 15 mL

## 2017-03-20 MED ORDER — ARTIFICIAL TEARS OPHTHALMIC OINT
TOPICAL_OINTMENT | OPHTHALMIC | Status: DC | PRN
  Administered 2017-03-20: 1 via OPHTHALMIC

## 2017-03-20 MED ORDER — TOPIRAMATE 25 MG PO TABS
50.0000 mg | ORAL_TABLET | Freq: Every day | ORAL | Status: DC
  Administered 2017-03-21: 50 mg via ORAL
  Filled 2017-03-20: qty 2

## 2017-03-20 MED ORDER — MEPERIDINE HCL 25 MG/ML IJ SOLN: 6.2500 mg | INTRAMUSCULAR | Status: DC | PRN

## 2017-03-20 MED ORDER — BUPIVACAINE-EPINEPHRINE (PF) 0.25% -1:200000 IJ SOLN
INTRAMUSCULAR | Status: AC
  Filled 2017-03-20: qty 30

## 2017-03-20 MED ORDER — LACTATED RINGERS IV SOLN
INTRAVENOUS | Status: DC
  Administered 2017-03-20: 10:00:00 via INTRAVENOUS

## 2017-03-20 MED ORDER — MIDAZOLAM HCL 2 MG/2ML IJ SOLN
INTRAMUSCULAR | Status: AC
  Filled 2017-03-20: qty 2

## 2017-03-20 MED ORDER — SODIUM CHLORIDE 0.9% FLUSH
3.0000 mL | Freq: Two times a day (BID) | INTRAVENOUS | Status: DC
  Administered 2017-03-20: 3 mL via INTRAVENOUS

## 2017-03-20 MED ORDER — SUGAMMADEX SODIUM 200 MG/2ML IV SOLN
INTRAVENOUS | Status: AC
  Filled 2017-03-20: qty 2

## 2017-03-20 MED ORDER — PHENYLEPHRINE HCL 10 MG/ML IJ SOLN
INTRAMUSCULAR | Status: DC | PRN
  Administered 2017-03-20: 30 ug/min via INTRAVENOUS

## 2017-03-20 MED ORDER — ROCURONIUM BROMIDE 50 MG/5ML IV SOLN
INTRAVENOUS | Status: AC
  Filled 2017-03-20: qty 1

## 2017-03-20 MED ORDER — BUPIVACAINE LIPOSOME 1.3 % IJ SUSP
20.0000 mL | INTRAMUSCULAR | Status: DC
  Filled 2017-03-20: qty 20

## 2017-03-20 MED ORDER — MIDAZOLAM HCL 5 MG/5ML IJ SOLN
INTRAMUSCULAR | Status: DC | PRN
  Administered 2017-03-20: 2 mg via INTRAVENOUS

## 2017-03-20 MED ORDER — ONDANSETRON HCL 4 MG PO TABS: 4.0000 mg | ORAL_TABLET | Freq: Four times a day (QID) | ORAL | Status: DC | PRN

## 2017-03-20 MED ORDER — MENTHOL 3 MG MT LOZG
1.0000 | LOZENGE | OROMUCOSAL | Status: DC | PRN
  Filled 2017-03-20: qty 9

## 2017-03-20 MED ORDER — PROMETHAZINE HCL 25 MG/ML IJ SOLN: 6.2500 mg | INTRAMUSCULAR | Status: DC | PRN

## 2017-03-20 MED ORDER — DEXAMETHASONE SODIUM PHOSPHATE 4 MG/ML IJ SOLN: 4.0000 mg | Freq: Four times a day (QID) | INTRAMUSCULAR | Status: AC

## 2017-03-20 MED ORDER — FENTANYL CITRATE (PF) 100 MCG/2ML IJ SOLN
INTRAMUSCULAR | Status: DC | PRN
  Administered 2017-03-20: 50 ug via INTRAVENOUS
  Administered 2017-03-20: 200 ug via INTRAVENOUS

## 2017-03-20 MED ORDER — DEXAMETHASONE SODIUM PHOSPHATE 10 MG/ML IJ SOLN
INTRAMUSCULAR | Status: DC | PRN
  Administered 2017-03-20: 10 mg via INTRAVENOUS

## 2017-03-20 SURGICAL SUPPLY — 51 items
BLADE CLIPPER SURG (BLADE) IMPLANT
BLADE SURG 11 STRL SS (BLADE) ×2 IMPLANT
CAGE DUAL SACRO ANCR 12.5X40 (Cage) ×1 IMPLANT
CAGE DUAL SACRO ANCR 12.5X50 (Cage) ×1 IMPLANT
CLSR STERI-STRIP ANTIMIC 1/2X4 (GAUZE/BANDAGES/DRESSINGS) ×1 IMPLANT
COVER SURGICAL LIGHT HANDLE (MISCELLANEOUS) ×2 IMPLANT
DRAPE C-ARM 42X72 X-RAY (DRAPES) ×2 IMPLANT
DRAPE C-ARMOR (DRAPES) ×2 IMPLANT
DRAPE SURG 17X23 STRL (DRAPES) ×2 IMPLANT
DRAPE U-SHAPE 47X51 STRL (DRAPES) ×2 IMPLANT
DRSG AQUACEL AG ADV 3.5X 6 (GAUZE/BANDAGES/DRESSINGS) ×2 IMPLANT
DRSG OPSITE POSTOP 4X6 (GAUZE/BANDAGES/DRESSINGS) ×1 IMPLANT
DURAPREP 26ML APPLICATOR (WOUND CARE) ×2 IMPLANT
ELECT BLADE 4.0 EZ CLEAN MEGAD (MISCELLANEOUS) ×2
ELECT PENCIL ROCKER SW 15FT (MISCELLANEOUS) ×2 IMPLANT
ELECT REM PT RETURN 9FT ADLT (ELECTROSURGICAL) ×2
ELECTRODE BLDE 4.0 EZ CLN MEGD (MISCELLANEOUS) ×1 IMPLANT
ELECTRODE REM PT RTRN 9FT ADLT (ELECTROSURGICAL) ×1 IMPLANT
GLOVE BIOGEL PI IND STRL 8.5 (GLOVE) ×1 IMPLANT
GLOVE BIOGEL PI INDICATOR 8.5 (GLOVE) ×1
GLOVE SS BIOGEL STRL SZ 8.5 (GLOVE) ×1 IMPLANT
GLOVE SUPERSENSE BIOGEL SZ 8.5 (GLOVE) ×1
GOWN STRL REUS W/ TWL LRG LVL3 (GOWN DISPOSABLE) ×1 IMPLANT
GOWN STRL REUS W/TWL 2XL LVL3 (GOWN DISPOSABLE) ×2 IMPLANT
GOWN STRL REUS W/TWL LRG LVL3 (GOWN DISPOSABLE) ×2
KIT BASIN OR (CUSTOM PROCEDURE TRAY) ×2 IMPLANT
KIT ROOM TURNOVER OR (KITS) ×2 IMPLANT
NEEDLE 22X1 1/2 (OR ONLY) (NEEDLE) ×2 IMPLANT
NS IRRIG 1000ML POUR BTL (IV SOLUTION) ×2 IMPLANT
PACK LAMINECTOMY ORTHO (CUSTOM PROCEDURE TRAY) ×2 IMPLANT
PACK UNIVERSAL I (CUSTOM PROCEDURE TRAY) ×2 IMPLANT
PAD ARMBOARD 7.5X6 YLW CONV (MISCELLANEOUS) ×4 IMPLANT
PIN FIXATION BLUNT STEIN 300MM (PIN) ×1 IMPLANT
PIN FIXATION EXCH 500MM (PIN) ×1 IMPLANT
PIN FIXATION THRD STEIN 300MM (PIN) ×4 IMPLANT
POSITIONER HEAD PRONE TRACH (MISCELLANEOUS) ×2 IMPLANT
PUTTY BONE DBX 2.5 MIS (Bone Implant) ×2 IMPLANT
SCREW DUAL THRD SACRO LCK 7X35 (Screw) ×1 IMPLANT
STAPLER VISISTAT 35W (STAPLE) ×2 IMPLANT
STRIP CLOSURE SKIN 1/2X4 (GAUZE/BANDAGES/DRESSINGS) ×1 IMPLANT
SUT MNCRL AB 3-0 PS2 18 (SUTURE) ×1 IMPLANT
SUT VIC AB 1 CT1 18XCR BRD 8 (SUTURE) ×1 IMPLANT
SUT VIC AB 1 CT1 8-18 (SUTURE) ×2
SUT VIC AB 2-0 CT1 18 (SUTURE) ×2 IMPLANT
SYR BULB IRRIGATION 50ML (SYRINGE) ×2 IMPLANT
SYR CONTROL 10ML LL (SYRINGE) ×2 IMPLANT
TOWEL OR 17X24 6PK STRL BLUE (TOWEL DISPOSABLE) ×1 IMPLANT
TOWEL OR 17X26 10 PK STRL BLUE (TOWEL DISPOSABLE) ×1 IMPLANT
WASHER 13 (Washer) ×1 IMPLANT
WATER STERILE IRR 1000ML POUR (IV SOLUTION) ×1 IMPLANT
YANKAUER SUCT BULB TIP NO VENT (SUCTIONS) ×2 IMPLANT

## 2017-03-20 NOTE — Anesthesia Procedure Notes (Addendum)
Procedure Name: Intubation Date/Time: 03/20/2017 11:34 AM Performed by: Jacquiline Doe A Pre-anesthesia Checklist: Patient identified, Emergency Drugs available, Suction available, Patient being monitored and Timeout performed Patient Re-evaluated:Patient Re-evaluated prior to inductionOxygen Delivery Method: Circle system utilized Preoxygenation: Pre-oxygenation with 100% oxygen Intubation Type: IV induction Ventilation: Mask ventilation without difficulty Laryngoscope Size: Miller and 3 Grade View: Grade II Tube type: Oral Tube size: 7.5 mm Number of attempts: 1 Airway Equipment and Method: Stylet Placement Confirmation: ETT inserted through vocal cords under direct vision,  positive ETCO2 and breath sounds checked- equal and bilateral Secured at: 22 cm Tube secured with: Tape Dental Injury: Teeth and Oropharynx as per pre-operative assessment  Comments: 4x4s bite block used.  Noted very poor dentition pre-induction, noted broken upper front, right incisor and numerous blackened at gum line.

## 2017-03-20 NOTE — Evaluation (Addendum)
Physical Therapy Evaluation Patient Details Name: Richard Glass MRN: [id number] DOB: [birth year] Today's Date: 03/20/2017   History of Present Illness  Pt is a 46 yo male admitted 03/20/17 for SI joint fusion. 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  Clinical Impression  Pt presents with the above diagnosis and below deficits for therapy evaluation. Prior to admission, pt lived with his mother in a single level home where is sister-in-law assists as needed. Pt was completely independent. Currently, pt requires Min A to Min guard for mobility with Min A for safety with gait this session with crutches and RW. Pt's gait is improved with RW, but will benefit from additional follow-up to ensure safety with RW with stair negotiation. Pt will benefit from continued acute follow-up to address the below deficits prior to discharge.     Follow Up Recommendations No PT follow up    Equipment Recommendations  None recommended by PT    Recommendations for Other Services       Precautions / Restrictions Precautions Precautions: Fall Restrictions Weight Bearing Restrictions: Yes LLE Weight Bearing: Non weight bearing      Mobility  Bed Mobility Overal bed mobility: Modified Independent             General bed mobility comments: able to get EOB with use of rails with increased time  Transfers Overall transfer level: Needs assistance Equipment used: Rolling walker (2 wheeled) Transfers: Sit to/from Stand Sit to Stand: Min guard         General transfer comment: MIn guard for safety from EOB with cues to maintain NWB LLE  Ambulation/Gait Ambulation/Gait assistance: Min assist;Min guard Ambulation Distance (Feet): 75 Feet Assistive device: Rolling walker (2 wheeled);Crutches Gait Pattern/deviations: Step-to pattern;Decreased step length - right;Decreased step length - left Gait velocity: decreased Gait velocity interpretation: Below normal speed for  age/gender General Gait Details: Attempted gait with crutches, pt requires cues for sequencing and for distance within crutches. Pt is unsteady and almost taking two steps with each movement of crutches. With RW, pt with improved safety, becomes fatigued using UE's. RW seems like a safer option at this time.   Stairs            Wheelchair Mobility    Modified Rankin (Stroke Patients Only)       Balance Overall balance assessment: Needs assistance Sitting-balance support: No upper extremity supported;Feet supported Sitting balance-Leahy Scale: Good     Standing balance support: No upper extremity supported Standing balance-Leahy Scale: Poor Standing balance comment: reliant on UE's for support in standing with NWB                             Pertinent Vitals/Pain Pain Assessment: 0-10 Pain Score: 6  Pain Location: left LE Pain Descriptors / Indicators: Aching Pain Intervention(s): Monitored during session;Premedicated before session;Repositioned    Home Living Family/patient expects to be discharged to:: Private residence Living Arrangements: Parent Available Help at Discharge: Family;Available 24 hours/day Type of Home: House Home Access: Stairs to enter Entrance Stairs-Rails: Right Entrance Stairs-Number of Steps: 3 Home Layout: One level Home Equipment: Walker - 2 wheels;Crutches      Prior Function Level of Independence: Independent         Comments: Pt was able to perform gait and all ADLs without assistance     Hand Dominance   Dominant Hand: Right    Extremity/Trunk Assessment  Upper Extremity Assessment Upper Extremity Assessment: Defer to OT evaluation    Lower Extremity Assessment Lower Extremity Assessment: LLE deficits/detail LLE Deficits / Details: pt with post op pain and weakness. At least 3/5 grossly    Cervical / Trunk Assessment Cervical / Trunk Assessment: Normal  Communication   Communication: No difficulties   Cognition Arousal/Alertness: Awake/alert Behavior During Therapy: WFL for tasks assessed/performed;Impulsive Overall Cognitive Status: Within Functional Limits for tasks assessed                                 General Comments: pt attempts to stand without being promted and without maintaing NWB       General Comments      Exercises     Assessment/Plan    PT Assessment Patient needs continued PT services  PT Problem List Decreased strength;Decreased activity tolerance;Decreased balance;Decreased mobility;Decreased knowledge of use of DME;Pain       PT Treatment Interventions DME instruction;Gait training;Stair training;Functional mobility training;Therapeutic activities;Therapeutic exercise;Balance training;Patient/family education    PT Goals (Current goals can be found in the Care Plan section)  Acute Rehab PT Goals Patient Stated Goal: to get home soon PT Goal Formulation: With patient Time For Goal Achievement: 04/03/17 Potential to Achieve Goals: Good    Frequency Min 5X/week   Barriers to discharge        Co-evaluation               AM-PAC PT "6 Clicks" Daily Activity  Outcome Measure Difficulty turning over in bed (including adjusting bedclothes, sheets and blankets)?: None Difficulty moving from lying on back to sitting on the side of the bed? : None Difficulty sitting down on and standing up from a chair with arms (e.g., wheelchair, bedside commode, etc,.)?: Total Help needed moving to and from a bed to chair (including a wheelchair)?: A Little Help needed walking in hospital room?: A Little Help needed climbing 3-5 steps with a railing? : A Lot 6 Click Score: 17    End of Session Equipment Utilized During Treatment: Gait belt Activity Tolerance: Patient tolerated treatment well Patient left: in chair;with call bell/phone within reach Nurse Communication: Mobility status PT Visit Diagnosis: Unsteadiness on feet (R26.81);Difficulty  in walking, not elsewhere classified (R26.2);Pain Pain - Right/Left: Left Pain - part of body: Hip    Time: 9150-5697 PT Time Calculation (min) (ACUTE ONLY): 19 min   Charges:   PT Evaluation $PT Eval Low Complexity: 1 Procedure     PT G Codes:        Scheryl Marten PT, DPT  [phone]   Jacqulyn Liner Sloan Leiter 03/20/2017, 4:32 PM

## 2017-03-20 NOTE — Anesthesia Preprocedure Evaluation (Signed)
Anesthesia Evaluation    Airway Mallampati: II  TM Distance: >3 FB Neck ROM: Full    Dental no notable dental hx. (+) Teeth Intact   Pulmonary asthma , former smoker,    Pulmonary exam normal breath sounds clear to auscultation       Cardiovascular negative cardio ROS Normal cardiovascular exam Rhythm:Regular Rate:Normal     Neuro/Psych  Headaches, Seizures -, Well Controlled,  PSYCHIATRIC DISORDERS Anxiety Hx/o traumatic brain injury Restless legs syndrome  Neuromuscular disease    GI/Hepatic GERD  Medicated and Controlled,  Endo/Other  Hyperlipidemia  Renal/GU   negative genitourinary   Musculoskeletal  (+) Arthritis , Osteoarthritis,  Chronic low back pain Left sacroiliac joint dysfunction Left sciatica   Abdominal   Peds  Hematology   Anesthesia Other Findings   Reproductive/Obstetrics                             Anesthesia Physical Anesthesia Plan  ASA: II  Anesthesia Plan: General   Post-op Pain Management:    Induction: Intravenous  PONV Risk Score and Plan: 3 and Ondansetron, Dexamethasone, Propofol and Midazolam  Airway Management Planned: Oral ETT  Additional Equipment:   Intra-op Plan:   Post-operative Plan: Extubation in OR  Informed Consent: I have reviewed the patients History and Physical, chart, labs and discussed the procedure including the risks, benefits and alternatives for the proposed anesthesia with the patient or authorized representative who has indicated his/her understanding and acceptance.   Dental advisory given  Plan Discussed with: CRNA, Anesthesiologist and Surgeon  Anesthesia Plan Comments:         Anesthesia Quick Evaluation

## 2017-03-20 NOTE — Transfer of Care (Signed)
Immediate Anesthesia Transfer of Care Note  Patient: Richard Glass  Procedure(s) Performed: Procedure(s) with comments: SACROILIAC JOINT FUSION (Left) - 90 mins  Patient Location: PACU  Anesthesia Type:General  Level of Consciousness: awake, patient cooperative and responds to stimulation  Airway & Oxygen Therapy: Patient Spontanous Breathing and Patient connected to nasal cannula oxygen  Post-op Assessment: Report given to RN, Post -op Vital signs reviewed and stable, Patient moving all extremities and Patient moving all extremities X 4  Post vital signs: Reviewed and stable  Last Vitals:  Vitals:   03/20/17 0851  BP: (!) 147/76  Pulse: (!) 51  Resp: 18  Temp: 36.5 C    Last Pain:  Vitals:   03/20/17 0936  TempSrc:   PainSc: 8       Patients Stated Pain Goal: 4 (47/09/62 8366)  Complications: No apparent anesthesia complications

## 2017-03-20 NOTE — Anesthesia Postprocedure Evaluation (Signed)
Anesthesia Post Note  Patient: Richard Glass  Procedure(s) Performed: Procedure(s) (LRB): SACROILIAC JOINT FUSION (Left)     Patient location during evaluation: PACU Anesthesia Type: General Level of consciousness: awake and alert and oriented Pain management: pain level controlled Vital Signs Assessment: post-procedure vital signs reviewed and stable Respiratory status: spontaneous breathing, nonlabored ventilation and respiratory function stable Cardiovascular status: blood pressure returned to baseline and stable Postop Assessment: no signs of nausea or vomiting Anesthetic complications: no    Last Vitals:  Vitals:   03/20/17 1400 03/20/17 1415  BP: 114/75 119/82  Pulse: (!) 49 (!) 47  Resp: 11 15  Temp:      Last Pain:  Vitals:   03/20/17 1330  TempSrc:   PainSc: 6                  Jeslynn Hollander A.

## 2017-03-20 NOTE — Discharge Instructions (Signed)
Ok to shower in 5 days DO NOT PUT WEIGHT ON LEFT LEG Keep dressing and wound clean and dry

## 2017-03-20 NOTE — Op Note (Signed)
  Operative note.  Preoperative diagnosis. Left SI joint dysfunction.  Postoperative diagnosis. Same.  Operative procedure. Left SI fusion.  Implant used. X I bone fusion system.  Complications. None.  First Environmental consultant. [place], [place].  Operative note. Patient was brought the operating room placed supine the operating room table. After successful induction of general anesthesia, endotracheal intubation teds SCDs were applied. He was turned prone onto chest and pelvic rolls. The arms were placed overhead and a well-padded position. The left lateral aspect of the pelvis was then prepped and draped in a standard fashion. Timeout was taken to confirm patient procedure and all other pertinent important data.  Using the fluoroscopy view I mapped out the contour of the SI joint. Once this was done I advanced the percutaneous pin to the superior corner of the SI joint. Once I confirmed I was properly placed in the lateral view I advanced the percutaneous pin into the pelvis. In the inlet and outlet view identified the anterior posterior margins of the sacrum as well as the foramen. I then advanced the pin across the SI joint into the sacrum. I confirm satisfactory position in the inlet outlet and lateral view. I then placed 2 subsequent pins again using fluoroscopic guidance. Care was taken to ensure that I remained in the sacrum and I did not violate anteriorly nor did I violate the foramen. Once all 3 pins were placed I then drilled over the first 2. The larger Herbert  Screw was packed with bone graft was inserted. The first one was a 50 mm length. I used a bone graft from the [place] drill as well as DBX mix. I had excellent fixation. I then placed a second screw. I made sure that I remained lateral to the S1 foramen. A third and final screw was a cannulated screw with a washer. All 3 screws had excellent purchase.  Final AP final inlet outlet and lateral views demonstrated satisfactory position of all  3 screws across the sacroiliac joint.  At this point the wound was copiously irrigated with normal saline and then closed in a layered fashion with interrupted #1 Vicryl suture, 2-0 Vicryl suture, and 3-0 Monocryl. Enalapril was used mixed with Marcaine for postoperative analgesia. Dry dressing was applied. Patient was extubated transferred the PACU that incident. The end of the case all needle sponge counts were correct.

## 2017-03-20 NOTE — H&P (Signed)
History of Present Illness The patient is a 46 year old male who comes in today for a preoperative History and Physical. The patient is scheduled for a Left SI Joint Fusion to be performed by Dr. Duane Lope D. Rolena Infante, MD at [facility] on 03/20/17 . Please see the hospital record for complete dictated history and physical. The pt reports overall good health. The pt does report dipping. I did inform him we need him to refrain after surgery. He voiced understanding.   Problem List/Past Medical Chronic bilateral low back pain with left-sided sciatica (M54.42)  SI (sacroiliac) pain (M53.3)  Contusion, knee (924.11) [05/28/1996]: (Marked as Inactive) Problems Reconciled   Allergies  No Known Drug Allergies  Allergies Reconciled   Family History  Diabetes Mellitus  mother Hypertension  mother  Social History  Tobacco use  former smoker; smoke(d) 2 pack(s) per day; uses 2 or more can(s) smokeless per week Tobacco / smoke exposure  no Alcohol use  current drinker; drinks beer; only occasionally per week Children  0 Current work status  disabled Drug/Alcohol Rehab (Currently)  no Drug/Alcohol Rehab (Previously)  no Exercise  Exercises daily; does running / walking Living situation  live with parents Marital status  single Number of flights of stairs before winded  greater than 5 Pain Contract  yes  Medication History  [employer] Cortisone Long-Lasting (1% Lotion, External) Active. Gabapentin (300MG  Capsule, Oral) Active. (#3 tabs tid) Hydrocodone-Acetaminophen (5-325MG  Tablet, Oral) Active. (bid - tid Rx'd by PCP) PHENobarbital (64.8MG  Tablet, Oral) Active. Topiramate (50MG  Tablet, Oral) Active. Fluticasone Propionate (50MCG/ACT Suspension, Nasal) Active. Cyclobenzaprine HCl (10MG  Tablet, Oral) Active. (tid Rx'd by PCP) Diclofenac Sodium (1% Gel, Transdermal) Active. (for wrist pain) Propranolol HCl (40MG  Tablet, Oral) Active. (qd) Medications  Reconciled  Vitals  03/17/2017 7:55 AM Weight: 186 lb Height: 68in Body Surface Area: 1.98 m Body Mass Index: 28.28 kg/m  Temp.: 98.73F  Pulse: 69 (Regular)  BP: 142/99 (Sitting, Right Arm, Standard)  General General Appearance-Not in acute distress. Orientation-Oriented X3. Build & Nutrition-Well nourished and Well developed.  Integumentary General Characteristics Surgical Scars - no surgical scar evidence of previous lumbar surgery. Lumbar Spine-Skin examination of the lumbar spine is without deformity, skin lesions, lacerations or abrasions.  Chest and Lung Exam Auscultation Breath sounds - Normal and Clear.  Cardiovascular Auscultation Rhythm - Regular rate and rhythm.  Abdomen Palpation/Percussion Palpation and Percussion of the abdomen reveal - Soft, Non Tender and No Rebound tenderness.  Peripheral Vascular Lower Extremity Palpation - Posterior tibial pulse - Bilateral - 2+. Dorsalis pedis pulse - Bilateral - 2+.  Neurologic Sensation Lower Extremity - Left - sensation is diminished in the lower extremity. Right - sensation is intact in the lower extremity. Reflexes Patellar Reflex - Bilateral - 2+. Achilles Reflex - Bilateral - 2+. Hoffman's Sign - Bilateral - Hoffman's sign not present. Testing Seated Straight Leg Raise - Bilateral - Seated straight leg raise negative.  Musculoskeletal Spine/Ribs/Pelvis  Lumbosacral Spine: Inspection and Palpation - Tenderness - left sacroiliac joint tender to palpation and left buttock is tender to palpation. Strength and Tone: Strength - Hip Flexion - Bilateral - 5/5. Knee Extension - Bilateral - 5/5. Knee Flexion - Bilateral - 5/5. Ankle Dorsiflexion - Bilateral - 5/5. Ankle Plantarflexion - Bilateral - 5/5. Heel walk - Bilateral - unable to heel walk. Toe Walk - Bilateral - unable to walk on toes. Heel-Toe Walk - Bilateral - able to heel-toe walk without difficulty. ROM - Flexion - moderately decreased  range of  motion and painful. Extension - moderately decreased range of motion and painful. Left Lateral Bending - moderately decreased range of motion and painful. Right Lateral Bending - moderately decreased range of motion and painful. Pain - neither flexion or extension is more painful than the other. Lumbosacral Spine - Waddell's Signs - no Waddell's signs present. Lower Extremity Range of Motion - No true hip, knee or ankle pain with range of motion. Gait and Station - [employer] - no assistive devices. Note: positive patricks positive femoral sheer    Plan: He has gone through six weeks of aquatic therapy, but there is no significant improvement once he is out of pool. He does have an SI injection with near complete relief of his pain for about one week. He had no relief with S1 selective nerve root block. At this point in time, he would like to proceed with surgery. His clinical exam is still consistent with SI pain. We have gone through the risks which include infection, bleeding, nerve damage, death, stroke, paralysis, injury to the L5 nerve root, hardware breakage, nonunion, need for additional surgery. All of his questions were addressed. We plan on doing this as soon as we get clearance from his neurologist and his primary care physician. The patient will be on crutches for six weeks postop and then six weeks of physical therapy and then he should be able to resume normal active life.

## 2017-03-20 NOTE — Brief Op Note (Signed)
03/20/2017  12:54 PM  PATIENT:  [name]  46 y.o. male  PRE-OPERATIVE DIAGNOSIS:  Left sacroiliac dysfuction  POST-OPERATIVE DIAGNOSIS:  Left sacroiliac dysfuction  PROCEDURE:  Procedure(s) with comments: SACROILIAC JOINT FUSION (Left) - 90 mins  SURGEON:  Surgeon(s) and Role:    Melina Schools, MD - Primary  PHYSICIAN ASSISTANT:   ASSISTANTS: Carmen Mayo   ANESTHESIA:   general  EBL:  Total I/O In: 1600 [I.V.:1600] Out: 10 [Blood:10]  BLOOD ADMINISTERED:none  DRAINS: none   LOCAL MEDICATIONS USED:  MARCAINE     SPECIMEN:  No Specimen  DISPOSITION OF SPECIMEN:  N/A  COUNTS:  YES  TOURNIQUET:  * No tourniquets in log *  DICTATION: .Dragon Dictation  PLAN OF CARE: Admit for overnight observation  PATIENT DISPOSITION:  PACU - hemodynamically stable.

## 2017-03-21 ENCOUNTER — Encounter (HOSPITAL_COMMUNITY): Payer: Self-pay | Admitting: Orthopedic Surgery

## 2017-03-21 NOTE — Progress Notes (Signed)
    Subjective: 1 Day Post-Op Procedure(s) (LRB): SACROILIAC JOINT FUSION (Left) Patient reports pain as 2 on 0-10 scale.   Denies CP or SOB.  Voiding without difficulty. Positive flatus. Objective: Vital signs in last 24 hours: Temp:  [97 F (36.1 C)-98.5 F (36.9 C)] 98.1 F (36.7 C) (07/06 0423) Pulse Rate:  [44-89] 56 (07/06 0423) Resp:  [11-19] 18 (07/06 0423) BP: (94-147)/(61-91) 94/61 (07/06 0423) SpO2:  [97 %-100 %] 97 % (07/06 0423) Weight:  [81.6 kg (180 lb)] 81.6 kg (180 lb) (07/05 0851)  Intake/Output from previous day: 07/05 0701 - 07/06 0700 In: 2050 [I.V.:1850] Out: 10 [Blood:10] Intake/Output this shift: No intake/output data recorded.  Labs: No results for input(s): HGB in the last 72 hours. No results for input(s): WBC, RBC, HCT, PLT in the last 72 hours. No results for input(s): NA, K, CL, CO2, BUN, CREATININE, GLUCOSE, CALCIUM in the last 72 hours. No results for input(s): LABPT, INR in the last 72 hours.  Physical Exam: Neurologically intact Intact pulses distally Incision: dressing C/D/I Compartment soft  Assessment/Plan: 1 Day Post-Op Procedure(s) (LRB): SACROILIAC JOINT FUSION (Left) Advance diet Up with therapy  Doing well No focal neuro deficit noted  Ok for d/c to home - f/u in 2 weeks  Derika Eckles D for Dr. Melina Schools [facility] [phone] 03/21/2017, 7:37 AM

## 2017-03-21 NOTE — Progress Notes (Signed)
Physical Therapy Treatment Patient Details Name: Richard Glass MRN: [id number] DOB: 1971-07-30 Today's Date: 03/21/2017    History of Present Illness Pt is a 46 yo male admitted 03/20/17 for SI joint fusion. PMH significant for chronic low back pain with L sciatica, TBI at age 47.    PT Comments    Pt tolerated increased gait distance however required min/mod A for ambulation with RW due to LOB X4. Pt demonstrated impulsivity (likely his baseline given history of cognitive deficits) and will need 24 hour assistance for any OOB mobility. Pt required max A for stair negotiation and unable to maintain NWB L LE. Pt will be safest using w/c upon d/c with L LE NWB status.     Follow Up Recommendations  No PT follow up; 24 hour supervision/assistance     Equipment Recommendations  Wheelchair (measurements PT);Wheelchair cushion (measurements PT);Other (comment) (elevated leg rests)    Recommendations for Other Services       Precautions / Restrictions Precautions Precautions: Fall Restrictions Weight Bearing Restrictions: Yes LLE Weight Bearing: Non weight bearing    Mobility  Bed Mobility Overal bed mobility: Modified Independent             General bed mobility comments: increased time/effort  Transfers Overall transfer level: Needs assistance Equipment used: Rolling walker (2 wheeled) Transfers: Sit to/from Stand Sit to Stand: Min assist         General transfer comment: assist to stabilize RW as pt pulls on RW to stand despite cues for safe hand placement   Ambulation/Gait Ambulation/Gait assistance: Min assist;Mod assist   Assistive device: Rolling walker (2 wheeled);Crutches Gait Pattern/deviations: Step-to pattern;Leaning posteriorly Gait velocity: decreased   General Gait Details: assist for balance and management of RW; cues for safe use of AD as pt tends to keep RW too close and loses balance posteriorly; LOB X4 with RW and cues to maintain hand placement  on RW; pt attempted to lean over and open door    Stairs Stairs: Yes   Stair Management: No rails;Backwards;With walker;Two rails;Forwards   General stair comments: cues for sequencing and technique; assistance for balance and maintaining NWB status; pt is impulsive and needed max vc for safety  Wheelchair Mobility    Modified Rankin (Stroke Patients Only)       Balance Overall balance assessment: Needs assistance Sitting-balance support: No upper extremity supported;Feet supported Sitting balance-Leahy Scale: Good     Standing balance support: No upper extremity supported Standing balance-Leahy Scale: Poor Standing balance comment: reliant on bilat UE support                            Cognition Arousal/Alertness: Awake/alert Behavior During Therapy: WFL for tasks assessed/performed;Impulsive Overall Cognitive Status: History of cognitive impairments - at baseline                                 General Comments: pt attempts to stand without being promted and without maintaing NWB       Exercises      General Comments        Pertinent Vitals/Pain Pain Assessment: Faces Faces Pain Scale: Hurts a little bit Pain Location: left LE Pain Descriptors / Indicators: Aching Pain Intervention(s): Monitored during session;Repositioned    Home Living Family/patient expects to be discharged to:: Private residence Living Arrangements: Parent Available Help at Discharge: Family;Available 24 hours/day Type of  Home: House Home Access: Stairs to enter Entrance Stairs-Rails: Right Home Layout: One level Home Equipment: Environmental consultant - 2 wheels;Crutches      Prior Function Level of Independence: Independent      Comments: Pt was able to perform gait and all ADLs without assistance   PT Goals (current goals can now be found in the care plan section) Acute Rehab PT Goals Patient Stated Goal: to get home soon PT Goal Formulation: With patient Time For  Goal Achievement: 04/03/17 Potential to Achieve Goals: Good Progress towards PT goals: Progressing toward goals    Frequency    Min 5X/week      PT Plan Equipment recommendations need to be updated    Co-evaluation              AM-PAC PT "6 Clicks" Daily Activity  Outcome Measure  Difficulty turning over in bed (including adjusting bedclothes, sheets and blankets)?: None Difficulty moving from lying on back to sitting on the side of the bed? : None Difficulty sitting down on and standing up from a chair with arms (e.g., wheelchair, bedside commode, etc,.)?: Total Help needed moving to and from a bed to chair (including a wheelchair)?: A Little Help needed walking in hospital room?: A Lot Help needed climbing 3-5 steps with a railing? : A Lot 6 Click Score: 16    End of Session Equipment Utilized During Treatment: Gait belt Activity Tolerance: Patient tolerated treatment well Patient left: with call bell/phone within reach;in bed;with family/visitor present (sitting EOB) Nurse Communication: Mobility status PT Visit Diagnosis: Unsteadiness on feet (R26.81);Difficulty in walking, not elsewhere classified (R26.2);Pain Pain - Right/Left: Left Pain - part of body: Hip     Time: 0071-2197 PT Time Calculation (min) (ACUTE ONLY): 32 min  Charges:  $Gait Training: 23-37 mins                    G Codes:       Earney Navy, PTA Pager: [phone]     Darliss Cheney 03/21/2017, 11:25 AM

## 2017-03-21 NOTE — Evaluation (Signed)
Occupational Therapy Evaluation and Discharge Patient Details Name: Richard Glass MRN: [id number] DOB: Apr 11, 1971 Today's Date: 03/21/2017    History of Present Illness Pt is a 46 yo male admitted 03/20/17 for SI joint fusion. 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   Clinical Impression   PTA pt independent in mobility and ADL. Pt currently min A for ADL and min A/guard for mobility with RW. Pt educated in compensatory strategies for ADL and safety (especially during shower transfer). Pt also educated in long handle sponge for bathing. Pt needs supervision and min guard for transfers with RW at this time. Pt will have 24 hour supervision at home with mother and sister-in-law. OT education complete and patient indicates understanding. Thank you for this referral.      Follow Up Recommendations  No OT follow up;Supervision/Assistance - 24 hour    Equipment Recommendations  3 in 1 bedside commode    Recommendations for Other Services       Precautions / Restrictions Precautions Precautions: Fall Restrictions Weight Bearing Restrictions: Yes LLE Weight Bearing: Non weight bearing      Mobility Bed Mobility Overal bed mobility: Modified Independent             General bed mobility comments: increased time/effort  Transfers Overall transfer level: Needs assistance Equipment used: Rolling walker (2 wheeled) Transfers: Sit to/from Stand Sit to Stand: Min assist         General transfer comment: assist to stabilize RW as pt pulls on RW to stand despite cues for safe hand placement     Balance Overall balance assessment: Needs assistance Sitting-balance support: No upper extremity supported;Feet supported Sitting balance-Leahy Scale: Good     Standing balance support: No upper extremity supported Standing balance-Leahy Scale: Poor Standing balance comment: reliant on bilat UE support                           ADL either  performed or assessed with clinical judgement   ADL Overall ADL's : Needs assistance/impaired Eating/Feeding: Set up   Grooming: Set up;Sitting;Wash/dry hands   Upper Body Bathing: Set up;Sitting Upper Body Bathing Details (indicate cue type and reason): discussed using the shower chair in the walk in shower due to NWB status on the LLE Lower Body Bathing: Min guard;Sitting/lateral leans;With adaptive equipment Lower Body Bathing Details (indicate cue type and reason): educated in use of long handle sponge when bathing BLE Upper Body Dressing : Set up;Sitting Upper Body Dressing Details (indicate cue type and reason): Pt was fully dressed when OT entered, and he reports that he completed this himself Lower Body Dressing: Minimal assistance;With caregiver independent assisting   Toilet Transfer: Minimal assistance;Ambulation;RW;Cueing for safety;Cueing for sequencing Toilet Transfer Details (indicate cue type and reason): vc for safety with RW and for NWB     Tub/ Shower Transfer: Walk-in shower;Minimal assistance;With caregiver independent assisting;Rolling walker   Functional mobility during ADLs: Min guard;Rolling walker (in room max cues)       Vision Patient Visual Report: No change from baseline       Perception     Praxis      Pertinent Vitals/Pain Pain Assessment: Faces Pain Score: 2  Faces Pain Scale: Hurts a little bit Pain Location: left LE Pain Descriptors / Indicators: Aching Pain Intervention(s): Monitored during session;Repositioned     Hand Dominance Right   Extremity/Trunk Assessment Upper Extremity Assessment Upper Extremity Assessment: Overall  WFL for tasks assessed (fatigues quickly - ROM WFL, sensation intact)   Lower Extremity Assessment LLE Deficits / Details: pt with post op pain and weakness. At least 3/5 grossly   Cervical / Trunk Assessment Cervical / Trunk Assessment: Normal   Communication Communication Communication: No  difficulties   Cognition Arousal/Alertness: Awake/alert Behavior During Therapy: WFL for tasks assessed/performed;Impulsive Overall Cognitive Status: History of cognitive impairments - at baseline                                 General Comments: pt attempts to stand without being promted and without maintaing NWB    General Comments  Pt's sister-in-law present for session    Exercises     Shoulder Instructions      Home Living Family/patient expects to be discharged to:: Private residence Living Arrangements: Parent Available Help at Discharge: Family;Available 24 hours/day Type of Home: House Home Access: Stairs to enter [employer] of Steps: 3 Entrance Stairs-Rails: Right Home Layout: One level     Bathroom Shower/Tub: Teacher, early years/pre: Standard     Home Equipment: Environmental consultant - 2 wheels;Crutches, shower seat          Prior Functioning/Environment Level of Independence: Independent        Comments: Pt was able to perform gait and all ADLs without assistance        OT Problem List:        OT Treatment/Interventions:      OT Goals(Current goals can be found in the care plan section) Acute Rehab OT Goals Patient Stated Goal: to get home soon OT Goal Formulation: With patient Time For Goal Achievement: 03/28/17 Potential to Achieve Goals: Good  OT Frequency:     Barriers to D/C:            Co-evaluation              AM-PAC PT "6 Clicks" Daily Activity     Outcome Measure Help from another person eating meals?: None Help from another person taking care of personal grooming?: A Little Help from another person toileting, which includes using toliet, bedpan, or urinal?: A Little Help from another person bathing (including washing, rinsing, drying)?: A Little Help from another person to put on and taking off regular upper body clothing?: None Help from another person to put on and taking off regular lower  body clothing?: A Lot 6 Click Score: 19   End of Session Equipment Utilized During Treatment: Gait belt;Rolling walker Nurse Communication: Mobility status;Other (comment) (DME needs)  Activity Tolerance: Patient tolerated treatment well Patient left: in bed;with call bell/phone within reach;with family/visitor present  OT Visit Diagnosis: Pain Pain - Right/Left: Left Pain - part of body: Leg                Time: 9295-7473 OT Time Calculation (min): 18 min Charges:  OT General Charges $OT Visit: 1 Procedure OT Evaluation $OT Eval Moderate Complexity: 1 Procedure G-Codes:     Hulda Humphrey OTR/L [place] 03/21/2017, 12:31 PM

## 2017-03-21 NOTE — Progress Notes (Signed)
Pt and family given D/C instructions with Rx's, verbal understanding was provided. Pt's incision is clean and dry with no sign of infection. Pt's IV was removed prior to D/C. Pt has order to pick up wheelchair from [facility] and 3-n-1 was delivered prior to D/C per MD order. Pt is stable @ D/C and has no other needs at this time. Holli Humbles, RN

## 2017-03-24 ENCOUNTER — Encounter: Payer: Self-pay | Admitting: Family Medicine

## 2017-03-27 ENCOUNTER — Encounter (HOSPITAL_COMMUNITY): Payer: Self-pay | Admitting: Orthopedic Surgery

## 2017-04-08 NOTE — Discharge Summary (Signed)
Physician Discharge Summary  Patient ID: Richard Glass MRN: [id number] DOB/AGE: 09/19/1970 46 y.o.  Admit date: 03/20/2017 Discharge date: 03/21/17  Admission Diagnoses:  SI joint Dysfunction  Discharge Diagnoses:  Active Problems:   SI (sacroiliac) pain   Past Medical History:  Diagnosis Date  . 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  . Allergic rhinitis 11/13/2006       . Anxiety   . Anxiety state 08/08/2013  . Asthma   . ASTHMA, INTERMITTENT 07/08/2010   Qualifier: Diagnosis of  By: Walker Kehr MD, Patrick Jupiter    . Back pain of lumbar region with sciatica 11/13/2006   MRI 12/2014 - lumbar DDD without focal neural impingement  MRI Lumbar 10/30/16 ([place]) Small central L5-S1 disc extrusion with minimal cranial migration and associated annular fissure approaches descending left S1 nerve roots without contact or displacement. Minimal bulging disc height loss without spinal canal stenosis or neural foraminal narrowing.  Minimal bulging disc at L2-L3 through L4-L5 without spinal canal stenosis or neural foraminal narrowing.  MRI Sacrum 10/30/16 ([place]) No fracture. Small lesion left sacral ala most likely a subchondral cyst/erosion adjacent to the sacroiliac joint.     . 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 Carpal tunnel syndrome 01/09/2015   Left 12/2014   . Cervical spine pain 02/06/2015   MRI 03/2015 FINDINGS: Vertebral body height, signal and alignment are normal. The craniocervical junction is normal and cervical cord signal is normal. The central spinal canal and neural foramina are widely patent at all levels. Scattered, mild degenerative change appears most notable at C4-5. Imaged paraspinous structures are unremarkable.  IMPRESSION: Negative for central canal or foraminal narrowing. No finding to explain the patient's symptoms. Scattered, mild facet degenerative  disease is noted.   . Chronic low back pain   . Chronic pain 01/18/2016  . Coccyx pain 02/06/2015  . 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   . Degenerative arthritis   . Dyslipidemia   . External hemorrhoid, bleeding 06/04/2011  . GASTROESOPHAGEAL REFLUX, NO ESOPHAGITIS 11/13/2006   Qualifier: Diagnosis of  By: Eusebio Friendly    . Headache(784.0)   . Hyperlipidemia 11/13/2006   07/2016  ASCVD score of 4.7% - recommended lifestyle changes   . Insomnia 02/02/2013  . Intention tremor 10/31/2009   Qualifier: Diagnosis of  By: Walker Kehr MD, Patrick Jupiter    . Intercostal pain 04/25/2015  . Left knee injury 05/09/2016  . Morton's neuroma of left foot 01/18/2016  . Osteopenia 10/10/2016  . Pain in joint, shoulder region 07/05/2014   LEFT > Right Reports hx rotator cuff surgery on rigt shoulder previously (Dr Nicholes Stairs) but no notes available XRAY right shoulder showed some proliferative changes distal rigt clavicle   . Rash and nonspecific skin eruption 10/10/2016  . Restless leg 09/29/2007   Qualifier: Diagnosis of  By: Walker Kehr MD, Patrick Jupiter    . Restless legs syndrome (RLS)   . Sciatica of left side 10/26/2014  . Seizures ([place])    last >6yrs  . SI (sacroiliac) pain 03/20/2017  . Sinusitis chronic, frontal   . Status post lumbar spinal fusion 03/20/2017  . Subacromial or subdeltoid bursitis 08/08/2009   MRI C-spine 2011([facility]) - normal MRI left shoulder 10/2010 ([facility]) - AC degenerative disease, normal rotator cuff 12/2012 -debridement, acromioplasty and distal clavicle excision of left shoulder, arthroscopy also performed  an no need for rotator cuff repair - performed by [facility] 02/2013 - Nerve Conduction Studies ([facility]) - ulnar nerve compression 02/2013- taken back to OR for Ulnar nerve decompression 06/2013 Repeat MRI left shoulder - subacromial/subdeltoid bursitis, a small intersubstance tear to the distal  infraspinatus and evidence of interval resection of the distal clavicle 06/2013 - steroid injection of left biceps 09/2013 -Repeat EMG showed improvement of ulnar nerve compression 10/2013 - referred to [facility] for second opinion due to intractable pain 11/2013 - Seen by Dr. Marlou Sa at [facility]; discussed risks/benefits of surgical intervention and bursectomy, no plan for surgery at this time     . 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 male who was admitted 03/20/2017 with a diagnosis of SI joint dysfunction and went to the operating room on 03/20/2017 and underwent the above named procedures.  The pt reports low level of pain well controlled on oral medications.  Pt is non-weightbearing on the left.  Pt was cleared by PT.  They did recommend a WC for the pt.  Pt was voiding w/o difficulty.   He was given perioperative antibiotics:  Anti-infectives    Start     Dose/Rate Route Frequency Ordered Stop   03/20/17 1900  ceFAZolin (ANCEF) IVPB 1 g/50 mL premix     1 g 100 mL/hr over 30 Minutes Intravenous Every 8 hours 03/20/17 1315 03/21/17 1059   03/20/17 0845  ceFAZolin (ANCEF) 2-4 GM/100ML-% IVPB    Comments:  Laurita Quint   : cabinet override      03/20/17 0845 03/20/17 1139   03/20/17 0844  ceFAZolin (ANCEF) IVPB 2g/100 mL premix     2 g 200 mL/hr over 30 Minutes Intravenous 30 min pre-op 03/20/17 0844 03/20/17 1209    .  He was given sequential compression devices, early ambulation, and TED for DVT prophylaxis.  He benefited maximally from the hospital stay and there were no complications.    Recent vital signs:  Vitals:   03/21/17 0423 03/21/17 0802  BP: 94/61 117/74  Pulse: (!) 56 (!) 105  Resp: 18 16   Temp: 98.1 F (36.7 C) 98 F (36.7 C)    Recent laboratory studies:  Lab Results  Component Value Date   HGB 14.3 03/12/2017   HGB 14.0 07/26/2016   HGB 13.9 04/25/2016   Lab Results  Component Value Date   WBC 7.4 03/12/2017   PLT 273 03/12/2017   No results found for: INR Lab Results  Component Value Date   NA 137 03/12/2017   K 3.5 03/12/2017   CL 108 03/12/2017   CO2 23 03/12/2017   BUN 5 (L) 03/12/2017   CREATININE 0.79 03/12/2017   GLUCOSE 97 03/12/2017    Discharge Medications:   Allergies as of 03/21/2017      Reactions   No Known Allergies       Medication List    STOP taking these medications   cyclobenzaprine 10 MG tablet Commonly known as:  FLEXERIL   diclofenac sodium 1 % Gel Commonly known as:  VOLTAREN   HYDROcodone-acetaminophen 5-325 MG tablet Commonly known as:  NORCO/VICODIN     TAKE these medications   cetirizine 10 MG tablet Commonly known as:  ZYRTEC  Take 10 mg by mouth daily.   cholecalciferol 1000 units tablet Commonly known as:  VITAMIN D Take 1,000 Units by mouth daily.   fluticasone 50 MCG/ACT nasal spray Commonly known as:  FLONASE Place 2 sprays into both nostrils daily.   gabapentin 300 MG capsule Commonly known as:  NEURONTIN Take 3 capsules (900 mg total) by mouth 3 (three) times daily.   methocarbamol 500 MG tablet Commonly known as:  ROBAXIN Take 1 tablet (500 mg total) by mouth 3 (three) times daily as needed for muscle spasms.   MICROSPACER Misc Inhale 2 puffs into the lungs every 4 (four) hours as needed. For use with MDI   ondansetron 4 MG tablet Commonly known as:  ZOFRAN Take 1 tablet (4 mg total) by mouth every 8 (eight) hours as needed for nausea or vomiting.   oxyCODONE-acetaminophen 10-325 MG tablet Commonly known as:  PERCOCET Take 1 tablet by mouth every 4 (four) hours as needed for pain.   PHENobarbital 64.8 MG tablet Commonly known as:  LUMINAL Take 3 tablets (194.4 mg total) by mouth at  bedtime.   primidone 50 MG tablet Commonly known as:  MYSOLINE Take 2 tablets (100 mg total) by mouth 2 (two) times daily.   topiramate 50 MG tablet Commonly known as:  TOPAMAX Take one tablet in the morning and two tablets in the evening       Diagnostic Studies: Dg Si Joints  Result Date: 03/20/2017 CLINICAL DATA:  Intraoperative fluoroscopy. Sacroiliac joint fusion. EXAM: BILATERAL SACROILIAC JOINTS - 3+ VIEW; DG C-ARM 61-120 MIN COMPARISON:  None. FINDINGS: Fluoroscopic images demonstrate placement of 3 surgical screws through the left SI joint. IMPRESSION: Left sacroiliac joint fusion. Electronically Signed   By: Markus Daft M.D.   On: 03/20/2017 13:45   Dg C-arm 1-60 Min  Result Date: 03/20/2017 CLINICAL DATA:  Intraoperative fluoroscopy. Sacroiliac joint fusion. EXAM: BILATERAL SACROILIAC JOINTS - 3+ VIEW; DG C-ARM 61-120 MIN COMPARISON:  None. FINDINGS: Fluoroscopic images demonstrate placement of 3 surgical screws through the left SI joint. IMPRESSION: Left sacroiliac joint fusion. Electronically Signed   By: Markus Daft M.D.   On: 03/20/2017 13:45    Disposition: 01-Home or Self Care  Pt will present to clinic in 2 weeks Post op meds provided post op WC order placed   Discharge Instructions    Incentive spirometry RT    Complete by:  As directed       Follow-up Information    Melina Schools, MD. Schedule an appointment as soon as possible for a visit in 2 weeks.   Specialty:  Orthopedic Surgery Why:  If symptoms worsen, For suture removal Contact information: [address] [place] [address] [phone]            Signed: Valinda Hoar 04/08/2017, 12:55 PM

## 2017-04-25 ENCOUNTER — Ambulatory Visit (INDEPENDENT_AMBULATORY_CARE_PROVIDER_SITE_OTHER): Payer: Medicare Other | Admitting: Neurology

## 2017-04-25 ENCOUNTER — Encounter: Payer: Self-pay | Admitting: Neurology

## 2017-04-25 VITALS — BP 120/82 | HR 75 | Ht 68.0 in | Wt 175.5 lb

## 2017-04-25 DIAGNOSIS — G252 Other specified forms of tremor: Secondary | ICD-10-CM | POA: Diagnosis not present

## 2017-04-25 DIAGNOSIS — Z5181 Encounter for therapeutic drug level monitoring: Secondary | ICD-10-CM | POA: Diagnosis not present

## 2017-04-25 DIAGNOSIS — R569 Unspecified convulsions: Secondary | ICD-10-CM | POA: Diagnosis not present

## 2017-04-25 MED ORDER — TOPIRAMATE 50 MG PO TABS: ORAL_TABLET | ORAL | 3 refills | Status: DC

## 2017-04-25 MED ORDER — PHENOBARBITAL 64.8 MG PO TABS: 194.4000 mg | ORAL_TABLET | Freq: Every day | ORAL | 1 refills | Status: DC

## 2017-04-25 MED ORDER — GABAPENTIN 300 MG PO CAPS: 900.0000 mg | ORAL_CAPSULE | Freq: Two times a day (BID) | ORAL | 1 refills | Status: DC

## 2017-04-25 MED ORDER — ALPRAZOLAM 0.5 MG PO TABS: 0.5000 mg | ORAL_TABLET | Freq: Three times a day (TID) | ORAL | 3 refills | Status: DC | PRN

## 2017-04-25 NOTE — Progress Notes (Signed)
Reason for visit: Seizures  Richard Glass is an 46 y.o. male  History of present illness:  Mr. Chandran is a 46 year old right-handed white male with a history of seizures that have been well controlled, he has not had a seizure in greater than 3 or 4 years. He is operating a motor vehicle without difficulty. He recently had fusion of the left SI joint done by Dr. Rolena Infante on 03/20/2017. He is still getting over this and has a lot of back pain, is not bearing any weight with his left leg. He is walking with a walker. The patient indicates that he has ongoing problems with tremors. His primary care physician placed him on Mysoline and worked him up to 100 mg twice daily. He has run out of the medication and he has not been on Mysoline for one week. He is on gabapentin as well taking 900 mg 3 times daily, but this was reduced to 900 mg twice daily 6 weeks ago. The patient indicates that his tremors prevent him from doing a lot of things with his hands, he indicates that he will drop platelets or drop a coffee pot because of the tremor. The patient has not had any recent falls. He returns to this office for an evaluation. In the past, he claims alprazolam helped his tremors more than anything. The patient remains on Topamax taking 50 mg the morning and 100 mg in the evening. He remains on phenobarbital taking the 68 mg tablet, 3 daily.  Past Medical History:  Diagnosis Date  . 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  . Allergic rhinitis 11/13/2006       . Anxiety   . Anxiety state 08/08/2013  . Asthma   . ASTHMA, INTERMITTENT 07/08/2010   Qualifier: Diagnosis of  By: Walker Kehr MD, Patrick Jupiter    . Back pain of lumbar region with sciatica 11/13/2006   MRI 12/2014 - lumbar DDD without focal neural impingement  MRI Lumbar 10/30/16 ([place]) Small central L5-S1 disc extrusion with minimal cranial migration and associated annular  fissure approaches descending left S1 nerve roots without contact or displacement. Minimal bulging disc height loss without spinal canal stenosis or neural foraminal narrowing.  Minimal bulging disc at L2-L3 through L4-L5 without spinal canal stenosis or neural foraminal narrowing.  MRI Sacrum 10/30/16 ([place]) No fracture. Small lesion left sacral ala most likely a subchondral cyst/erosion adjacent to the sacroiliac joint.     . 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 Carpal tunnel syndrome 01/09/2015   Left 12/2014   . Cervical spine pain 02/06/2015   MRI 03/2015 FINDINGS: Vertebral body height, signal and alignment are normal. The craniocervical junction is normal and cervical cord signal is normal. The central spinal canal and neural foramina are widely patent at all levels. Scattered, mild degenerative change appears most notable at C4-5. Imaged paraspinous structures are unremarkable.  IMPRESSION: Negative for central canal or foraminal narrowing. No finding to explain the patient's symptoms. Scattered, mild facet degenerative disease is noted.   . Chronic low back pain   . Chronic pain 01/18/2016  . Coccyx pain 02/06/2015  . 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   . Degenerative arthritis   . Dyslipidemia   . External hemorrhoid, bleeding 06/04/2011  . GASTROESOPHAGEAL REFLUX, NO ESOPHAGITIS 11/13/2006   Qualifier: Diagnosis of  By: Eusebio Friendly    . Headache(784.0)   . Hyperlipidemia 11/13/2006   07/2016  ASCVD score of 4.7% - recommended lifestyle changes   . Insomnia 02/02/2013  . Intention tremor 10/31/2009   Qualifier: Diagnosis of  By: Walker Kehr MD, Patrick Jupiter    . Intercostal pain 04/25/2015  . Left knee injury 05/09/2016  . Morton's neuroma of left foot 01/18/2016  . Osteopenia 10/10/2016  . Pain in joint, shoulder region 07/05/2014   LEFT > Right Reports hx rotator cuff surgery on  rigt shoulder previously (Dr Nicholes Stairs) but no notes available XRAY right shoulder showed some proliferative changes distal rigt clavicle   . Rash and nonspecific skin eruption 10/10/2016  . Restless leg 09/29/2007   Qualifier: Diagnosis of  By: Walker Kehr MD, Patrick Jupiter    . Restless legs syndrome (RLS)   . Sciatica of left side 10/26/2014  . Seizures ([place])    last >54yrs  . SI (sacroiliac) pain 03/20/2017  . Sinusitis chronic, frontal   . Status post lumbar spinal fusion 03/20/2017  . Subacromial or subdeltoid bursitis 08/08/2009   MRI C-spine 2011([facility]) - normal MRI left shoulder 10/2010 ([facility]) - AC degenerative disease, normal rotator cuff 12/2012 -debridement, acromioplasty and distal clavicle excision of left shoulder, arthroscopy also performed an no need for rotator cuff repair - performed by [facility] 02/2013 - Nerve Conduction Studies ([facility]) - ulnar nerve compression 02/2013- taken back to OR for Ulnar nerve decompression 06/2013 Repeat MRI left shoulder - subacromial/subdeltoid bursitis, a small intersubstance tear to the distal infraspinatus and evidence of interval resection of the distal clavicle 06/2013 - steroid injection of left biceps 09/2013 -Repeat EMG showed improvement of ulnar nerve compression 10/2013 - referred to [facility] for second opinion due to intractable pain 11/2013 - Seen by Dr. Marlou Sa at [facility]; discussed risks/benefits of surgical intervention and bursectomy, no plan for surgery at this time     . 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 [place]   struck by golf ball-46 yrs old  . CRANIOTOMY    . NASAL FRACTURE SURGERY  1997  . SACROILIAC JOINT FUSION Left 03/20/2017   Procedure: SACROILIAC JOINT FUSION;  Surgeon: Melina Schools, MD;  Location: [place];  Service:  Orthopedics;  Laterality: Left;  90 mins  . SHOULDER ARTHROSCOPY Left    "cleaned arthritis out"  . SHOULDER SURGERY Right 2012   Rotator cuff surgery  . TONSILLECTOMY    . UVULOPALATOPHARYNGOPLASTY (UPPP)/TONSILLECTOMY/SEPTOPLASTY  2001    Family History  Problem Relation Age of Onset  . Cancer Father        lung  . Heart disease Father   . Hypertension Mother   . Seizures Neg Hx     Social history:  reports that he quit smoking about 4 years ago. His smoking use included Cigarettes. He has a 22.00 pack-year smoking history. His smokeless tobacco use includes Snuff. He reports that he does not drink alcohol or use drugs.    Allergies  Allergen Reactions  . No Known Allergies     Medications:  Prior to Admission medications   Medication Sig Start Date End Date Taking? Authorizing Provider  CALCIUM PO Take 600 mg by mouth daily.   Yes [provider]  cetirizine (ZYRTEC) 10 MG tablet Take 10 mg by mouth daily.  Yes [provider]  cholecalciferol (VITAMIN D) 1000 units tablet Take 1,000 Units by mouth daily.   Yes [provider]  fluticasone (FLONASE) 50 MCG/ACT nasal spray Place 2 sprays into both nostrils daily. 07/12/16  Yes Lupita Dawn, MD  gabapentin (NEURONTIN) 300 MG capsule Take 3 capsules (900 mg total) by mouth 3 (three) times daily. 12/16/16  Yes Lupita Dawn, MD  HYDROcodone-acetaminophen (NORCO/VICODIN) 5-325 MG tablet Take 1 tablet by mouth every 6 (six) hours as needed. for pain 04/17/17  Yes [provider]  PHENobarbital (LUMINAL) 64.8 MG tablet Take 3 tablets (194.4 mg total) by mouth at bedtime. 11/04/16  Yes Lupita Dawn, MD  primidone (MYSOLINE) 50 MG tablet Take 2 tablets (100 mg total) by mouth 2 (two) times daily. 02/28/17  Yes Lupita Dawn, MD  topiramate (TOPAMAX) 50 MG tablet Take one tablet in the morning and two tablets in the evening 04/25/16  Yes Ward Givens, NP  methocarbamol (ROBAXIN) 500 MG tablet  Take 1 tablet (500 mg total) by mouth 3 (three) times daily as needed for muscle spasms. Patient not taking: Reported on 04/25/2017 03/20/17   Melina Schools, MD  ondansetron (ZOFRAN) 4 MG tablet Take 1 tablet (4 mg total) by mouth every 8 (eight) hours as needed for nausea or vomiting. Patient not taking: Reported on 04/25/2017 03/20/17   Melina Schools, MD  oxyCODONE-acetaminophen (PERCOCET) 10-325 MG tablet Take 1 tablet by mouth every 4 (four) hours as needed for pain. Patient not taking: Reported on 04/25/2017 03/20/17   Melina Schools, MD  Spacer/Aero-Holding Chambers [facility]) MISC Inhale 2 puffs into the lungs every 4 (four) hours as needed. For use with MDI Patient not taking: Reported on 03/07/2017 10/22/11   Candelaria Celeste, MD    ROS:  Out of a complete 14 system review of symptoms, the patient complains only of the following symptoms, and all other reviewed systems are negative.  Seizures Tremor Low back pain  Blood pressure 120/82, pulse 75, height 5\' 8"  (1.727 m), weight 175 lb 8 oz (79.6 kg), SpO2 98 %.  Physical Exam  General: The patient is alert and cooperative at the time of the examination.  Skin: No significant peripheral edema is noted.   Neurologic Exam  Mental status: The patient is alert and oriented x 3 at the time of the examination. The patient has apparent normal recent and remote memory, with an apparently normal attention span and concentration ability.   Cranial nerves: Facial symmetry is present. Speech is normal, no aphasia or dysarthria is noted. Extraocular movements are full. Visual fields are full.  Motor: The patient has good strength in all 4 extremities.  Sensory examination: Soft touch sensation is symmetric on the face, arms, and legs.  Coordination: The patient has good finger-nose-finger and heel-to-shin bilaterally. The patient does have an intention tremor with finger-nose-finger bilaterally. No definite asterixis is seen. No significant  tremor was seen with drawing a spiral.  Gait and station: The patient is currently not able to bear weight on the left leg, he is using a walker and bearing weight on the right leg.  Reflexes: Deep tendon reflexes are symmetric.   Assessment/Plan:  1. History seizures, well controlled  2. Tremor  The patient indicates that he is having significant issues with tremor, he reports that he is dropping things, the reason for this is not clear. Tremors alone usually do not cause individuals to drop objects, the patient could have a problem with asterixis on  the gabapentin, but he claims that the dose was recently decreased. The Mysoline breaks down into phenobarbital, he has not been on this medication for one week. We will keep him off of the Mysoline, I will write a small prescription for alprazolam to take if needed. The patient was given a prescription for the phenobarbital and the Topamax. Blood work will be done today, he will follow-up in one year, sooner if needed.  Jill Alexanders MD 04/25/2017 8:02 AM  [facility] [address] [place], [place] [address]  Phone [phone] Fax [phone]

## 2017-04-28 ENCOUNTER — Other Ambulatory Visit: Payer: Self-pay | Admitting: Adult Health

## 2017-04-28 LAB — COMPREHENSIVE METABOLIC PANEL
ALT: 7 IU/L (ref 0–44)
AST: 12 IU/L (ref 0–40)
Albumin/Globulin Ratio: 2.1 (ref 1.2–2.2)
Albumin: 4.7 g/dL (ref 3.5–5.5)
Alkaline Phosphatase: 128 IU/L — ABNORMAL HIGH (ref 39–117)
BUN/Creatinine Ratio: 7 — ABNORMAL LOW (ref 9–20)
BUN: 6 mg/dL (ref 6–24)
Bilirubin Total: <0.2 mg/dL (ref 0.0–1.2)
CO2: 22 mmol/L (ref 20–29)
Calcium: 9.3 mg/dL (ref 8.7–10.2)
Chloride: 104 mmol/L (ref 96–106)
Creatinine, Ser: 0.87 mg/dL (ref 0.76–1.27)
GFR calc Af Amer: 118 mL/min/{1.73_m2} (ref >59)
GFR calc non Af Amer: 102 mL/min/{1.73_m2} (ref >59)
Globulin, Total: 2.2 g/dL (ref 1.5–4.5)
Glucose: 65 mg/dL (ref 65–99)
Potassium: 4.4 mmol/L (ref 3.5–5.2)
Sodium: 142 mmol/L (ref 134–144)
Total Protein: 6.9 g/dL (ref 6.0–8.5)

## 2017-04-28 LAB — CBC WITH DIFFERENTIAL/PLATELET
Basophils Absolute: 0 10*3/uL (ref 0.0–0.2)
Basos: 0 %
EOS (ABSOLUTE): 0.1 10*3/uL (ref 0.0–0.4)
Eos: 1 %
Hematocrit: 43 % (ref 37.5–51.0)
Hemoglobin: 14.1 g/dL (ref 13.0–17.7)
Immature Grans (Abs): 0 10*3/uL (ref 0.0–0.1)
Immature Granulocytes: 0 %
Lymphocytes Absolute: 2.3 10*3/uL (ref 0.7–3.1)
Lymphs: 31 %
MCH: 29.6 pg (ref 26.6–33.0)
MCHC: 32.8 g/dL (ref 31.5–35.7)
MCV: 90 fL (ref 79–97)
Monocytes Absolute: 0.5 10*3/uL (ref 0.1–0.9)
Monocytes: 7 %
Neutrophils Absolute: 4.6 10*3/uL (ref 1.4–7.0)
Neutrophils: 61 %
Platelets: 249 10*3/uL (ref 150–379)
RBC: 4.76 x10E6/uL (ref 4.14–5.80)
RDW: 14.1 % (ref 12.3–15.4)
WBC: 7.6 10*3/uL (ref 3.4–10.8)

## 2017-04-28 LAB — TOPIRAMATE LEVEL: Topiramate Lvl: 4.7 ug/mL (ref 2.0–25.0)

## 2017-04-28 LAB — PHENOBARBITAL LEVEL: Phenobarbital, Serum: 37 ug/mL (ref 15–40)

## 2017-04-29 ENCOUNTER — Telehealth: Payer: Self-pay | Admitting: *Deleted

## 2017-04-29 MED ORDER — PHENOBARBITAL 64.8 MG PO TABS: 194.4000 mg | ORAL_TABLET | Freq: Every day | ORAL | 1 refills | Status: DC

## 2017-04-29 NOTE — Telephone Encounter (Signed)
LVM for patient to call about results. Gave [facility] phone number.   Also need to ask about rx phenobarbital. Received request from his pharmacy, however, Dr Jannifer Franklin gave him a prescription on 04/25/17 to bring to his pharmacy.

## 2017-04-29 NOTE — Telephone Encounter (Signed)
I called the patient. The pharmacy never received the prescription for phenobarbital, I will redo the prescription.

## 2017-04-29 NOTE — Telephone Encounter (Signed)
Patient called office back. Relayed lab results per CW,MD note. Pt verbalized understanding.   I asked if he had printed rx phenobarbital. Pt states he does not, he checked everywhere he could. I advised per CW,MD last note, he gave him the prescription. Pt states he does not have it.

## 2017-04-29 NOTE — Telephone Encounter (Signed)
Re-faxed rx phenobarbital to pt pharmacy. Fax: [phone]. Received confirmation.

## 2017-04-29 NOTE — Telephone Encounter (Signed)
-----   Message from Kathrynn Ducking, MD sent at 04/28/2017  4:59 PM EDT ----- Blood work is unremarkable exception of very minimal elevation in alkaline phosphatase face, not clinically significant. CBC is unremarkable, Topamax and phenobarbital levels are therapeutic, No change in dosing. Please call the patient. ----- Message ----- From: Lavone Neri Lab Results In Sent: 04/26/2017   7:42 AM To: Kathrynn Ducking, MD

## 2017-05-01 ENCOUNTER — Ambulatory Visit (INDEPENDENT_AMBULATORY_CARE_PROVIDER_SITE_OTHER): Payer: Medicare Other | Admitting: Family Medicine

## 2017-05-01 ENCOUNTER — Encounter: Payer: Self-pay | Admitting: Family Medicine

## 2017-05-01 VITALS — BP 114/60 | HR 66 | Temp 97.7°F | Ht 68.0 in | Wt 176.0 lb

## 2017-05-01 DIAGNOSIS — M533 Sacrococcygeal disorders, not elsewhere classified: Secondary | ICD-10-CM

## 2017-05-01 DIAGNOSIS — G894 Chronic pain syndrome: Secondary | ICD-10-CM

## 2017-05-01 DIAGNOSIS — G252 Other specified forms of tremor: Secondary | ICD-10-CM

## 2017-05-01 DIAGNOSIS — R561 Post traumatic seizures: Secondary | ICD-10-CM

## 2017-05-01 DIAGNOSIS — D492 Neoplasm of unspecified behavior of bone, soft tissue, and skin: Secondary | ICD-10-CM | POA: Diagnosis not present

## 2017-05-01 DIAGNOSIS — Z967 Presence of other bone and tendon implants: Secondary | ICD-10-CM

## 2017-05-01 DIAGNOSIS — M542 Cervicalgia: Secondary | ICD-10-CM | POA: Diagnosis not present

## 2017-05-01 DIAGNOSIS — G8929 Other chronic pain: Secondary | ICD-10-CM

## 2017-05-01 DIAGNOSIS — M5440 Lumbago with sciatica, unspecified side: Secondary | ICD-10-CM

## 2017-05-01 DIAGNOSIS — G9389 Other specified disorders of brain: Secondary | ICD-10-CM | POA: Diagnosis not present

## 2017-05-01 HISTORY — DX: Presence of other bone and tendon implants: Z96.7

## 2017-05-01 MED ORDER — HYDROCODONE-ACETAMINOPHEN 5-325 MG PO TABS: 1.0000 | ORAL_TABLET | Freq: Four times a day (QID) | ORAL | 0 refills | Status: DC | PRN

## 2017-05-01 MED ORDER — CETIRIZINE HCL 10 MG PO TABS: 10.0000 mg | ORAL_TABLET | Freq: Every day | ORAL | 99 refills | Status: DC

## 2017-05-01 MED ORDER — ALPRAZOLAM 0.5 MG PO TABS: 0.5000 mg | ORAL_TABLET | Freq: Three times a day (TID) | ORAL | 3 refills | Status: DC | PRN

## 2017-05-01 MED ORDER — GABAPENTIN 300 MG PO CAPS: 900.0000 mg | ORAL_CAPSULE | Freq: Two times a day (BID) | ORAL | 5 refills | Status: DC

## 2017-05-01 NOTE — Progress Notes (Signed)
   Subjective:    Patient ID: Richard Glass, male    DOB: 1970-10-04, 46 y.o.   MRN: [id number] Richard Glass is alone Sources of clinical information for visit is/are patient and past medical records. Nursing assessment for this office visit was reviewed with the patient for accuracy and revision.   HPI Chronic Pain Syndrome - Longstanding problem.  - Been prescribed hydrocodone since pt of Dr Walker Kehr.  Dr Su Hoff continued prescribing - takes for pain in left lower back and neck.  - recently received percocet 5-325 prescription from [facility] for 30 tablets forpost op patient from left SI Joint fusion7/5/18 by Dr Retia Passe.  No weight bearing for 6 weeks.  Pt still not bearing weight on left leg.  Hops using rolling walker.  - Pt continues to drive. No accidents. - He reports that the Norco permits him ambulate with walker and perform ADLs and iADLs that he finds very difficult when he does not have the pain medication.  - Denies confusion, falls, constipation - Only rare alcohol use and denies illicit substance use.   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  Consult tiwht Neuroology (Dr Jannifer Franklin) who started back Alprazolam 0.5 mg TID prn.  Pt is taking the Alpraxolam three times a day regularly.  - No different diagnosis from Dr Jannifer Franklin from essential tremor  SH: smokes (+) Review of Systems See HPI    Objective:   Physical Exam        Assessment & Plan:

## 2017-05-01 NOTE — Assessment & Plan Note (Signed)
Improved with prescription of alprazolam o.5 mg TID prn from Dr Jannifer Franklin (Neuro) which pt is taking three times a day regularly.  I discussed my concern that patient is on both opioid and benzo.  I explained that Richard Glass will need to continue to receive his prescription for Xanax form his specialist.

## 2017-05-01 NOTE — Assessment & Plan Note (Signed)
Pt denies ever having a bone tumor or going to [facility]

## 2017-05-01 NOTE — Assessment & Plan Note (Signed)
Established problem worsened.  Acute post-op pain from SI joint fusion operation 03/20/17 by Dr Retia Passe.  Patient received postop percocet from [facility] on 03/21/17 and on 04/07/17 (30 tab) Pt reports that the chronic Norco 5/325 #100 per month allow him to complete his ADLs and iADLs which he could not do without them.  He denies adverse effects from [place]. Other than the recent postop percocet prescriptions from [place], mr Roback has been receiving his opioid prescriptions just from Dr Su Hoff after review of [place].  New pain contract signed. Rx for Norco 5/325 #100 fill now, fill 30 days, and fill 60 days RTC 3 months.

## 2017-05-02 ENCOUNTER — Encounter: Payer: Self-pay | Admitting: Family Medicine

## 2017-05-02 NOTE — Progress Notes (Signed)
[facility] 04/07/17 Provider:Carmen Mayo, PA-C CC: Postop visit  POD#18 Left SI joint fusion (03/20/17) pain7/10 in should from using wlaker.  Pt requesting refill of percocet.  Robxin refilled.  Nonwieght bearing status.  No using brace.  A/ Fall Postop SI joint fusion: doing well. Repeat Pelvic Xray imaging shows no abnormalities of fusion from fall.  P/ Rx WC not yet obtained, Contacting [facility] about getting WC to pt.  Refilled pain medication. Recommend ice therapy No weight bearing for one more month.  RTC 4 weeks when will consider starting PT

## 2017-05-05 ENCOUNTER — Encounter (HOSPITAL_COMMUNITY): Payer: Self-pay | Admitting: Emergency Medicine

## 2017-05-05 ENCOUNTER — Emergency Department (HOSPITAL_COMMUNITY)
Admission: EM | Admit: 2017-05-05 | Discharge: 2017-05-05 | Disposition: A | Discharge status: Home / Self Care | Payer: Medicare Other | Attending: Emergency Medicine | Admitting: Emergency Medicine

## 2017-05-05 ENCOUNTER — Emergency Department (HOSPITAL_BASED_OUTPATIENT_CLINIC_OR_DEPARTMENT_OTHER)
Admit: 2017-05-05 | Discharge: 2017-05-05 | Disposition: A | Discharge status: Home / Self Care | Payer: Medicare Other | Attending: Emergency Medicine | Admitting: Emergency Medicine

## 2017-05-05 ENCOUNTER — Emergency Department (HOSPITAL_COMMUNITY): Payer: Medicare Other

## 2017-05-05 ENCOUNTER — Encounter (HOSPITAL_COMMUNITY): Payer: Medicare Other

## 2017-05-05 DIAGNOSIS — Z87891 Personal history of nicotine dependence: Secondary | ICD-10-CM | POA: Insufficient documentation

## 2017-05-05 DIAGNOSIS — M7989 Other specified soft tissue disorders: Secondary | ICD-10-CM | POA: Diagnosis not present

## 2017-05-05 DIAGNOSIS — Z79899 Other long term (current) drug therapy: Secondary | ICD-10-CM | POA: Insufficient documentation

## 2017-05-05 DIAGNOSIS — R079 Chest pain, unspecified: Secondary | ICD-10-CM | POA: Diagnosis not present

## 2017-05-05 DIAGNOSIS — R2242 Localized swelling, mass and lump, left lower limb: Secondary | ICD-10-CM | POA: Diagnosis not present

## 2017-05-05 DIAGNOSIS — J45909 Unspecified asthma, uncomplicated: Secondary | ICD-10-CM | POA: Insufficient documentation

## 2017-05-05 LAB — BASIC METABOLIC PANEL
Anion gap: 9 (ref 5–15)
BUN: 6 mg/dL (ref 6–20)
CO2: 24 mmol/L (ref 22–32)
Calcium: 9.4 mg/dL (ref 8.9–10.3)
Chloride: 106 mmol/L (ref 101–111)
Creatinine, Ser: 0.83 mg/dL (ref 0.61–1.24)
GFR calc Af Amer: >60 mL/min (ref >60)
GFR calc non Af Amer: >60 mL/min (ref >60)
Glucose, Bld: 86 mg/dL (ref 65–99)
Potassium: 4.1 mmol/L (ref 3.5–5.1)
Sodium: 139 mmol/L (ref 135–145)

## 2017-05-05 LAB — CBC
HCT: 43.4 % (ref 39.0–52.0)
Hemoglobin: 14.2 g/dL (ref 13.0–17.0)
MCH: 30.1 pg (ref 26.0–34.0)
MCHC: 32.7 g/dL (ref 30.0–36.0)
MCV: 92.1 fL (ref 78.0–100.0)
Platelets: 278 10*3/uL (ref 150–400)
RBC: 4.71 MIL/uL (ref 4.22–5.81)
RDW: 13.4 % (ref 11.5–15.5)
WBC: 6.7 10*3/uL (ref 4.0–10.5)

## 2017-05-05 LAB — D-DIMER, QUANTITATIVE: D-Dimer, Quant: 0.29 ug/mL-FEU (ref 0.00–0.50)

## 2017-05-05 LAB — PROTIME-INR
INR: 1.02
Prothrombin Time: 13.5 seconds (ref 11.4–15.2)

## 2017-05-05 LAB — I-STAT TROPONIN, ED: Troponin i, poc: 0.01 ng/mL (ref 0.00–0.08)

## 2017-05-05 NOTE — Progress Notes (Signed)
VASCULAR LAB PRELIMINARY  PRELIMINARY  PRELIMINARY  PRELIMINARY  Left lower extremity venous duplex completed.    Preliminary report:  There is no DVT or SVT noted in the left lower extremity.  Called results to [place], [place]  Annalina Needles, [place], RVT 05/05/2017, 1:59 PM

## 2017-05-05 NOTE — ED Triage Notes (Signed)
Pt arrives via [facility] from [facility] c/o CP with SOB since yesterday afternoon. Pt reports recent surgery for sacral fusion.  Pt rpeorts pain behind L knwee starting 4 days ago, area tender to palpation but no redness or swelling noted. Resp e/u.

## 2017-05-05 NOTE — ED Notes (Signed)
Per Dr Rolena Infante, states patient was seen in his office today complaining of calf pain, SOB and chest discomfort-states he is worried patient might have DVT and/or PE-sending patient here for work up

## 2017-05-05 NOTE — Discharge Instructions (Signed)
Please read and follow all provided instructions.  Your diagnoses today include:  1. Chest pain, unspecified type   2. Leg swelling     Tests performed today include:  An EKG of your heart  A chest x-ray  Cardiac enzymes - a blood test for heart muscle damage  Blood counts and electrolytes  Ultrasound of your leg - no signs of blood clot  Blood test for blood clot - negative  Vital signs. See below for your results today.   Medications prescribed:   None  Take any prescribed medications only as directed.  Follow-up instructions: Please follow-up with your primary care provider as soon as you can for further evaluation of your symptoms.   Return instructions:  SEEK IMMEDIATE MEDICAL ATTENTION IF:  You have severe chest pain, especially if the pain is crushing or pressure-like and spreads to the arms, back, neck, or jaw, or if you have sweating, nausea (feeling sick to your stomach), or shortness of breath. THIS IS AN EMERGENCY. Don't wait to see if the pain will go away. Get medical help at once. Call 911 or 0 (operator). DO NOT drive yourself to the hospital.   Your chest pain gets worse and does not go away with rest.   You have an attack of chest pain lasting longer than usual, despite rest and treatment with the medications your caregiver has prescribed.   You wake from sleep with chest pain or shortness of breath.  You feel dizzy or faint.  You have chest pain not typical of your usual pain for which you originally saw your caregiver.   You have any other emergent concerns regarding your health.  Additional Information: Chest pain comes from many different causes. Your caregiver has diagnosed you as having chest pain that is not specific for one problem, but does not require admission.  You are at low risk for an acute heart condition or other serious illness.   Your vital signs today were: BP 100/68 (BP Location: Left Arm)    Pulse (!) 51    Temp 98.4 F  (36.9 C) (Oral)    Resp 17    Ht 5\' 8"  (1.727 m)    Wt 79.8 kg (176 lb)    SpO2 97%    BMI 26.76 kg/m  If your blood pressure (BP) was elevated above 135/85 this visit, please have this repeated by your doctor within one month. --------------

## 2017-05-05 NOTE — ED Provider Notes (Signed)
[facility] DEPT Provider Note   CSN: [id number] Arrival date & time: 05/05/17  1106     History   Chief Complaint Chief Complaint  Patient presents with  . Chest Pain    HPI Richard Glass is a 46 y.o. male.  Patient with history of lumbosacral radiculopathy, surgery on back approximately 6 weeks ago presents from his orthopedist office. Patient reports developing pain and swelling in his left posterior calf and thigh about 4 days ago. Two days ago patient developed chest pain with some mild shortness of breath. Pain does not radiate. Not worse with deep breathing. No changes in activities. He is ambulatory but with a walker. No fevers. He has had a mild nonproductive cough. No URI symptoms, lightheadedness or syncope, vomiting, diarrhea. No abdominal pain. Patient does not have a history of blood clots. No family history of blood clots.      Past Medical History:  Diagnosis Date  . 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  . Allergic rhinitis 11/13/2006       . Anxiety   . Anxiety state 08/08/2013  . Asthma   . ASTHMA, INTERMITTENT 07/08/2010   Qualifier: Diagnosis of  By: Walker Kehr MD, Patrick Jupiter    . Back pain of lumbar region with sciatica 11/13/2006   MRI 12/2014 - lumbar DDD without focal neural impingement  MRI Lumbar 10/30/16 ([place]) Small central L5-S1 disc extrusion with minimal cranial migration and associated annular fissure approaches descending left S1 nerve roots without contact or displacement. Minimal bulging disc height loss without spinal canal stenosis or neural foraminal narrowing.  Minimal bulging disc at L2-L3 through L4-L5 without spinal canal stenosis or neural foraminal narrowing.  MRI Sacrum 10/30/16 ([place]) No fracture. Small lesion left sacral ala most likely a subchondral cyst/erosion adjacent to the sacroiliac joint.     . 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 Carpal tunnel syndrome 01/09/2015   Left 12/2014   . Cervical spine pain 02/06/2015   MRI 03/2015 FINDINGS: Vertebral body height, signal and alignment are normal. The craniocervical junction is normal and cervical cord signal is normal. The central spinal canal and neural foramina are widely patent at all levels. Scattered, mild degenerative change appears most notable at C4-5. Imaged paraspinous structures are unremarkable.  IMPRESSION: Negative for central canal or foraminal narrowing. No finding to explain the patient's symptoms. Scattered, mild facet degenerative disease is noted.   . Chronic low back pain   . Chronic pain 01/18/2016  . Coccyx pain 02/06/2015  . 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   . Degenerative arthritis   . Dyslipidemia   . External hemorrhoid, bleeding 06/04/2011  . GASTROESOPHAGEAL REFLUX, NO ESOPHAGITIS 11/13/2006   Qualifier: Diagnosis of  By: Eusebio Friendly    . Headache(784.0)   . Hyperlipidemia 11/13/2006   07/2016  ASCVD score of 4.7% - recommended lifestyle changes   . Insomnia 02/02/2013  . Intention tremor 10/31/2009   Qualifier: Diagnosis of  By: Walker Kehr MD, Patrick Jupiter    . Intercostal pain 04/25/2015  . Left knee injury 05/09/2016  . 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 Morton's neuroma of left foot 01/18/2016  . Osteopenia 10/10/2016  . Pain in joint, shoulder region 07/05/2014   LEFT > Right Reports hx rotator cuff surgery on rigt shoulder previously (Dr Nicholes Stairs) but no notes available  XRAY right shoulder showed some proliferative changes distal rigt clavicle   . Rash and nonspecific skin eruption 10/10/2016  . Restless leg 09/29/2007   Qualifier: Diagnosis of  By: Walker Kehr MD, Patrick Jupiter    . Restless legs syndrome (RLS)   . Sciatica of left side 10/26/2014  . Seizures ([place])    last >87yrs  . SI (sacroiliac) pain 03/20/2017  . Sinusitis chronic, frontal   . Status post  lumbar spinal fusion 03/20/2017  . Subacromial or subdeltoid bursitis 08/08/2009   MRI C-spine 2011([facility]) - normal MRI left shoulder 10/2010 ([facility]) - AC degenerative disease, normal rotator cuff 12/2012 -debridement, acromioplasty and distal clavicle excision of left shoulder, arthroscopy also performed an no need for rotator cuff repair - performed by [facility] 02/2013 - Nerve Conduction Studies ([facility]) - ulnar nerve compression 02/2013- taken back to OR for Ulnar nerve decompression 06/2013 Repeat MRI left shoulder - subacromial/subdeltoid bursitis, a small intersubstance tear to the distal infraspinatus and evidence of interval resection of the distal clavicle 06/2013 - steroid injection of left biceps 09/2013 -Repeat EMG showed improvement of ulnar nerve compression 10/2013 - referred to [facility] for second opinion due to intractable pain 11/2013 - Seen by Dr. Marlou Sa at [facility]; discussed risks/benefits of surgical intervention and bursectomy, no plan for surgery at this time     . 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 SI (sacroiliac) pain 03/20/2017  . Osteopenia 10/10/2016  . Bone tumor 05/17/2016  . Chronic pain 01/18/2016  . Cervical spine pain 02/06/2015  . Anxiety state 08/08/2013  . Insomnia 02/02/2013  . ASTHMA, INTERMITTENT 07/08/2010  . Intention tremor 10/31/2009  . Encounter for chronic pain management 07/04/2009  . Overweight(278.02) 06/13/2009  . Restless leg 09/29/2007  . Hyperlipidemia 11/13/2006  . Allergic rhinitis 11/13/2006  . GASTROESOPHAGEAL REFLUX, NO ESOPHAGITIS 11/13/2006  . Back pain of lumbar region with sciatica 11/13/2006  . Convulsions ([place]) 11/13/2006    Past Surgical  History:  Procedure Laterality Date  . [place]   struck by golf ball-46 yrs old  . CRANIOTOMY    . NASAL FRACTURE SURGERY  1997  . SACROILIAC JOINT FUSION Left 03/20/2017   Procedure: SACROILIAC JOINT FUSION;  Surgeon: Melina Schools, MD;  Location: [facility];  Service: Orthopedics;  Laterality: Left;  90 mins  . SHOULDER ARTHROSCOPY Left    "cleaned arthritis out"  . SHOULDER SURGERY Right 2012   Rotator cuff surgery  . TONSILLECTOMY    . UVULOPALATOPHARYNGOPLASTY (UPPP)/TONSILLECTOMY/SEPTOPLASTY  2001       Home Medications    Prior to Admission medications   Medication Sig Start Date End Date Taking? Authorizing Provider  ALPRAZolam [name]) 0.5 MG tablet Take 1 tablet (0.5 mg total) by mouth 3 (three) times daily as needed for anxiety. Patient taking differently: Take 0.5 mg by mouth 3 (three) times daily as needed for anxiety. Prescribed by Elon Alas. Jannifer Franklin, MD (Neurology) 05/01/17   [name], [name], MD  CALCIUM PO Take 600 mg by mouth daily.    [provider]  cetirizine (ZYRTEC) 10 MG tablet Take 1 tablet (10 mg total) by mouth daily. 05/01/17   [name], [name], MD  cholecalciferol (VITAMIN D) 1000 units tablet Take 1,000 Units  by mouth daily.    [provider]  fluticasone (FLONASE) 50 MCG/ACT nasal spray Place 2 sprays into both nostrils daily. 07/12/16   Lupita Dawn, MD  gabapentin (NEURONTIN) 300 MG capsule Take 3 capsules (900 mg total) by mouth 2 (two) times daily. Patient taking differently: Take 900 mg by mouth 3 (three) times daily.  05/01/17   [name], [name], MD  HYDROcodone-acetaminophen (NORCO) 5-325 MG tablet Take 1 tablet by mouth every 6 (six) hours as needed for moderate pain. 05/01/17   [name], [name], MD  HYDROcodone-acetaminophen (NORCO) 5-325 MG tablet Take 1 tablet by mouth every 6 (six) hours as needed for moderate pain. 05/01/17   [name], [name], MD  HYDROcodone-acetaminophen (NORCO/VICODIN) 5-325 MG tablet Take 1  tablet by mouth every 6 (six) hours as needed. for pain 05/01/17   [name], [name], MD  PHENobarbital (LUMINAL) 64.8 MG tablet Take 3 tablets (194.4 mg total) by mouth at bedtime. 04/29/17   Kathrynn Ducking, MD  topiramate (TOPAMAX) 50 MG tablet Take one tablet in the morning and two tablets in the evening 04/25/17   Kathrynn Ducking, MD    Family History Family History  Problem Relation Age of Onset  . Cancer Father        lung  . Heart disease Father   . Hypertension Mother   . Seizures Neg Hx     Social History Social History  Substance Use Topics  . Smoking status: Former Smoker    Packs/day: 1.00    Years: 22.00    Types: Cigarettes    Quit date: 06/22/2012  . Smokeless tobacco: Current User    Types: Snuff  . Alcohol use No     Comment: none since 7/11     Allergies   No known allergies   Review of Systems Review of Systems  Constitutional: Negative for diaphoresis and fever.  Eyes: Negative for redness.  Respiratory: Positive for cough and shortness of breath.   Cardiovascular: Positive for chest pain and leg swelling. Negative for palpitations.  Gastrointestinal: Negative for abdominal pain, nausea and vomiting.  Genitourinary: Negative for dysuria.  Musculoskeletal: Positive for back pain. Negative for neck pain.  Skin: Negative for rash.  Neurological: Negative for syncope and light-headedness.  Psychiatric/Behavioral: The patient is not nervous/anxious.      Physical Exam Updated Vital Signs BP 100/68 (BP Location: Left Arm)   Pulse (!) 51   Temp 98.4 F (36.9 C) (Oral)   Resp 17   Ht 5\' 8"  (1.727 m)   Wt 79.8 kg (176 lb)   SpO2 97%   BMI 26.76 kg/m   Physical Exam  Constitutional: He appears well-developed and well-nourished.  HENT:  Head: Normocephalic and atraumatic.  Mouth/Throat: Mucous membranes are normal. Mucous membranes are not dry.  Eyes: Conjunctivae are normal. Right eye exhibits no discharge. Left eye exhibits no discharge.    Neck: Trachea normal and normal range of motion. Neck supple. Normal carotid pulses and no JVD present. No muscular tenderness present. Carotid bruit is not present. No tracheal deviation present.  Cardiovascular: Normal rate, regular rhythm, S1 normal, S2 normal, normal heart sounds and intact distal pulses.  Exam reveals no distant heart sounds and no decreased pulses.   No murmur heard. Pulmonary/Chest: Effort normal and breath sounds normal. No respiratory distress. He has no wheezes. He exhibits no tenderness.  Abdominal: Soft. Normal aorta and bowel sounds are normal. There is no tenderness. There is no rebound and no  guarding.  Musculoskeletal: He exhibits tenderness. He exhibits no edema.  Patient endorses tenderness to the left posterior proximal calf and distal thigh. No gross swelling, erythema, or induration noted on exam.  Neurological: He is alert.  Skin: Skin is warm and dry. He is not diaphoretic. No cyanosis. No pallor.  Psychiatric: He has a normal mood and affect.  Nursing note and vitals reviewed.    ED Treatments / Results  Labs (all labs ordered are listed, but only abnormal results are displayed) Labs Reviewed  BASIC METABOLIC PANEL  CBC  PROTIME-INR  D-DIMER, QUANTITATIVE (NOT AT [facility])  I-STAT TROPONIN, ED    Radiology Dg Chest 2 View  Result Date: 05/05/2017 CLINICAL DATA:  Chest pain, shortness of Breath EXAM: CHEST  2 VIEW COMPARISON:  04/25/2015 FINDINGS: Heart and mediastinal contours are within normal limits. No focal opacities or effusions. No acute bony abnormality. IMPRESSION: No active cardiopulmonary disease. Electronically Signed   By: Rolm Baptise M.D.   On: 05/05/2017 12:05    Procedures Procedures (including critical care time)  Medications Ordered in ED Medications - No data to display   Initial Impression / Assessment and Plan / ED Course  I have reviewed the triage vital signs and the nursing notes.  Pertinent labs & imaging results  that were available during my care of the patient were reviewed by me and considered in my medical decision making (see chart for details).     Patient seen and examined. Work-up initiated. Medications ordered. EKG reviewed. D-dimer/DVT study ordered. Will need CT angio if either are positive.   Vital signs reviewed and are as follows: BP 100/68 (BP Location: Left Arm)   Pulse (!) 51   Temp 98.4 F (36.9 C) (Oral)   Resp 17   Ht 5\' 8"  (1.727 m)   Wt 79.8 kg (176 lb)   SpO2 97%   BMI 26.76 kg/m   3:08 PM Work-up negative, discussed all results with patient. He is angry about being in the hallway for 2 hours and not allowed to eat. I apologized for his wait and situation today while the ED was full with boarding patients. Encouraged f/u with his PCP. He requests removal of IV and immediate discharge.   Patient was counseled to return with severe chest pain, especially if the pain is crushing or pressure-like and spreads to the arms, back, neck, or jaw, or if they have sweating, nausea, or shortness of breath with the pain. They were encouraged to call 911 with these symptoms.   They were also told to return if their chest pain gets worse and does not go away with rest, they have an attack of chest pain lasting longer than usual despite rest and treatment with the medications their caregiver has prescribed, if they wake from sleep with chest pain or shortness of breath, if they feel dizzy or faint, if they have chest pain not typical of their usual pain, or if they have any other emergent concerns regarding their health.  The patient verbalized understanding and agreed.    Final Clinical Impressions(s) / ED Diagnoses   Final diagnoses:  Chest pain, unspecified type  Leg swelling   Patient with recent back surgery with subjective left leg swelling and chest pain, sent from orthopedic office due to concern for blood clot. Vital signs are normal. EKG, troponin are reassuring. D-dimer is  normal, lower extremity ultrasound without signs of DVT. Feel patient is low risk for ACS given history (poor story for ACS/MI),  negative troponin(s), normal EKG. No evidence of DVT/PE on exam. CXR clear.   No dangerous or life-threatening conditions suspected or identified by history, physical exam, and by work-up. No indications for hospitalization identified.    New Prescriptions New Prescriptions   No medications on file     Carlisle Cater, Hershal Coria 05/05/17 1515    Duffy Bruce, MD 05/07/17 [phone]

## 2017-05-15 ENCOUNTER — Telehealth: Payer: Self-pay | Admitting: *Deleted

## 2017-05-15 NOTE — Telephone Encounter (Signed)
Melonie from [facility] wants to verify that Dr. [name] know that Rx'ing xanax and norco together increases the overall risk of Respiratory depression.   She would like call back to verify before filling. Please call her at [phone]. Callahan Wild, Salome Spotted, CMA

## 2017-05-16 NOTE — Telephone Encounter (Signed)
Spoke with pharmacist melonie and gave her patient's diagnosis code and also the ok from PCP for this medication combination. Richard Glass,CMA

## 2017-05-16 NOTE — Telephone Encounter (Signed)
Please clarify with patient patient reason he is having problem getting pain medication.    If it is because patient is prescribed both Norco (by me) and Xanax by Dr Jannifer Franklin.  Let [facility] know that I am aware of the risks of the combination.   Mr Richard Glass should have his monthly supply of Norco dispensed.    Any refills of Xanax will have to come from the physician who prescribed it.

## 2017-05-16 NOTE — Telephone Encounter (Signed)
Patient called stating he is having trouble getting his pain medication filled.  See note from 05/15/17 when [facility] called.  Advised patient that he need to contact his neurologist regarding the Xanax Rx or he could have [facility] call.  Derl Barrow, RN

## 2017-05-16 NOTE — Telephone Encounter (Signed)
Please ask Richard Glass to contact Richard Glass) as he is prescribed the Xanax.    I have informed Richard Glass that I will not be prescribing the Xanax.

## 2017-05-21 ENCOUNTER — Telehealth: Payer: Self-pay | Admitting: *Deleted

## 2017-05-21 NOTE — Telephone Encounter (Signed)
Patient received refill for Melatonin from the pharmacy.  Wyatt Haste, Student Nurse/~Jeannette Marvel Plan, BSN, RN-BC

## 2017-05-27 ENCOUNTER — Telehealth: Payer: Self-pay | Admitting: *Deleted

## 2017-05-27 NOTE — Telephone Encounter (Signed)
Left message on VM to return call to schedule AWV. Hubbard Hartshorn, RN, BSN

## 2017-05-27 NOTE — Telephone Encounter (Signed)
-----   Message from [name] [name], MD sent at 05/01/2017  4:00 PM EDT ----- Regarding: AWV Please contact Mr Whetzel to set up an AWV

## 2017-06-05 ENCOUNTER — Other Ambulatory Visit: Payer: Self-pay | Admitting: *Deleted

## 2017-06-05 DIAGNOSIS — G4700 Insomnia, unspecified: Secondary | ICD-10-CM

## 2017-06-05 MED ORDER — MELATONIN 3 MG PO TABS: 3.0000 mg | ORAL_TABLET | Freq: Every day | ORAL | 1 refills | Status: DC

## 2017-06-24 NOTE — Telephone Encounter (Signed)
Left message on VM requesting return call to schedule AWV. Hubbard Hartshorn, RN, BSN

## 2017-07-08 ENCOUNTER — Ambulatory Visit (INDEPENDENT_AMBULATORY_CARE_PROVIDER_SITE_OTHER): Payer: Medicare Other | Admitting: Internal Medicine

## 2017-07-08 ENCOUNTER — Other Ambulatory Visit: Payer: Self-pay | Admitting: Family Medicine

## 2017-07-08 ENCOUNTER — Encounter: Payer: Self-pay | Admitting: Internal Medicine

## 2017-07-08 DIAGNOSIS — M533 Sacrococcygeal disorders, not elsewhere classified: Secondary | ICD-10-CM

## 2017-07-08 DIAGNOSIS — M5440 Lumbago with sciatica, unspecified side: Secondary | ICD-10-CM

## 2017-07-08 DIAGNOSIS — W57XXXA Bitten or stung by nonvenomous insect and other nonvenomous arthropods, initial encounter: Secondary | ICD-10-CM | POA: Diagnosis not present

## 2017-07-08 DIAGNOSIS — S20369A Insect bite (nonvenomous) of unspecified front wall of thorax, initial encounter: Secondary | ICD-10-CM | POA: Diagnosis not present

## 2017-07-08 DIAGNOSIS — G894 Chronic pain syndrome: Secondary | ICD-10-CM

## 2017-07-08 MED ORDER — DOXYCYCLINE HYCLATE 100 MG PO TABS: 100.0000 mg | ORAL_TABLET | Freq: Two times a day (BID) | ORAL | 0 refills | Status: DC

## 2017-07-08 NOTE — Progress Notes (Signed)
   Subjective:   Patient: Richard Glass       Birthdate: 1971/08/11       MRN: [id number]      HPI  Richard Glass is a 46 y.o. male presenting for tick bite.   Tick bite Patient noticed red marks on his chest yesterday. This morning when he got out of the shower, he noticed something black in the same location as the red marks. He began to scratch at the black area, and pulled two ticks out of his skin. He is not sure how long the ticks may have been there. He works outside every day. He denies any rashes or redness elsewhere on his skin. Denies fevers, chills. Endorses decreased appetite and some nausea today but no vomiting or abdominal pain. Generally feels well.   Smoking status reviewed. Patient is former smoker, current smokeless tobacco user.   Review of Systems See HPI.     Objective:  Physical Exam  Constitutional: He is oriented to person, place, and time and well-developed, well-nourished, and in no distress.  HENT:  Head: Normocephalic and atraumatic.  Eyes: Conjunctivae and EOM are normal. Right eye exhibits no discharge. Left eye exhibits no discharge.  Cardiovascular: Normal rate.   Pulmonary/Chest: Effort normal. No respiratory distress.  Neurological: He is alert and oriented to person, place, and time.  Skin:  Erythematous area ~3cm in circumference on L chest near axilla. Indurated, very tender to palpation. Darker area in center of lesion most consistent in appearance and palpation with darkened blood under the skin. No other skin lesions noted. Skin warm and dry.   Psychiatric: Affect and judgment normal.      Assessment & Plan:  Tick bite of chest wall, initial encounter First noticed this AM, however unsure how long ticks may have been adhered. Generally feels well today, though with significant tenderness at site of bite. Will treat empirically with doxy BID x10d. Strict return precautions given.   Precepted with Dr. [name].   Adin Hector, MD,  MPH PGY-3 [facility] Pager [phone]

## 2017-07-08 NOTE — Patient Instructions (Signed)
It was nice meeting you today Mr. Barge!  Please begin taking the antibiotic (doxycycline) twice a day for the next 10 days. It is important to take all 10 days worth of antibiotics.   You can put hydrocortisone cream from the drugstore on the area to help with the itching. You can also take Benadryl at night if the itching is keeping you awake.   If you start to develop a rash, or begin to have fevers, nausea, vomiting, or abdominal pain, please call our office.   If you have any questions or concerns, please feel free to call the clinic.   Be well,  Dr. Avon Gully

## 2017-07-08 NOTE — Telephone Encounter (Signed)
Patient needs a refill on his hydrocodone .  Please call [phone] w/questions.

## 2017-07-08 NOTE — Assessment & Plan Note (Signed)
First noticed this AM, however unsure how long ticks may have been adhered. Generally feels well today, though with significant tenderness at site of bite. Will treat empirically with doxy BID x10d. Strict return precautions given.

## 2017-07-09 ENCOUNTER — Encounter: Payer: Self-pay | Admitting: Family Medicine

## 2017-07-09 MED ORDER — HYDROCODONE-ACETAMINOPHEN 5-325 MG PO TABS: 1.0000 | ORAL_TABLET | Freq: Four times a day (QID) | ORAL | 0 refills | Status: DC | PRN

## 2017-07-09 NOTE — Telephone Encounter (Signed)
Please let patient know her prescription(s) are available for pick up from the [facility] front desk.  

## 2017-07-09 NOTE — Telephone Encounter (Signed)
Pt contacted and informed or rx ready for pick up at [facility]. Pt verbalized understanding.

## 2017-07-22 ENCOUNTER — Other Ambulatory Visit: Payer: Self-pay | Admitting: Family Medicine

## 2017-07-22 MED ORDER — FLUTICASONE PROPIONATE 50 MCG/ACT NA SUSP: 2.0000 | Freq: Every day | NASAL | 3 refills | Status: DC

## 2017-08-21 ENCOUNTER — Other Ambulatory Visit: Payer: Self-pay

## 2017-08-21 ENCOUNTER — Ambulatory Visit (INDEPENDENT_AMBULATORY_CARE_PROVIDER_SITE_OTHER): Payer: Medicare Other | Admitting: Family Medicine

## 2017-08-21 ENCOUNTER — Encounter: Payer: Self-pay | Admitting: Family Medicine

## 2017-08-21 VITALS — BP 110/62 | HR 86 | Temp 98.0°F | Wt 174.0 lb

## 2017-08-21 DIAGNOSIS — G9389 Other specified disorders of brain: Secondary | ICD-10-CM

## 2017-08-21 DIAGNOSIS — G8929 Other chronic pain: Secondary | ICD-10-CM

## 2017-08-21 DIAGNOSIS — D492 Neoplasm of unspecified behavior of bone, soft tissue, and skin: Secondary | ICD-10-CM

## 2017-08-21 DIAGNOSIS — G894 Chronic pain syndrome: Secondary | ICD-10-CM | POA: Diagnosis not present

## 2017-08-21 DIAGNOSIS — M533 Sacrococcygeal disorders, not elsewhere classified: Secondary | ICD-10-CM

## 2017-08-21 DIAGNOSIS — M5440 Lumbago with sciatica, unspecified side: Secondary | ICD-10-CM

## 2017-08-21 DIAGNOSIS — R561 Post traumatic seizures: Secondary | ICD-10-CM

## 2017-08-21 DIAGNOSIS — E663 Overweight: Secondary | ICD-10-CM | POA: Diagnosis not present

## 2017-08-21 MED ORDER — HYDROCODONE-ACETAMINOPHEN 5-325 MG PO TABS: 1.0000 | ORAL_TABLET | Freq: Four times a day (QID) | ORAL | 0 refills | Status: DC | PRN

## 2017-08-21 NOTE — Patient Instructions (Addendum)
I am glad that the pain medicine helps you be able to do things.   Dr Lakrisha Iseman will contact Dr Rolena Infante at [facility] to let him know about the possible bone tumor around your left knee.  I will also left him know about the pain and numbness in your legs and in your right knee.  I will also left him know about the pain in your right hand and wrist.    Calorie Counting for Weight Loss Calories are units of energy. Your body needs a certain amount of calories from food to keep you going throughout the day. When you eat more calories than your body needs, your body stores the extra calories as fat. When you eat fewer calories than your body needs, your body burns fat to get the energy it needs. Calorie counting means keeping track of how many calories you eat and drink each day. Calorie counting can be helpful if you need to lose weight. If you make sure to eat fewer calories than your body needs, you should lose weight. Ask your health care provider what a healthy weight is for you. For calorie counting to work, you will need to eat the right number of calories in a day in order to lose a healthy amount of weight per week. A dietitian can help you determine how many calories you need in a day and will give you suggestions on how to reach your calorie goal.  A healthy amount of weight to lose per week is usually 1-2 lb (0.5-0.9 kg). This usually means that your daily calorie intake should be reduced by 500-750 calories.  Eating 1,200 - 1,500 calories per day can help most women lose weight.  Eating 1,500 - 1,800 calories per day can help most men lose weight.  What is my plan? My goal is to have __________ calories per day. If I have this many calories per day, I should lose around __________ pounds per week. What do I need to know about calorie counting? In order to meet your daily calorie goal, you will need to:  Find out how many calories are in each food you would like to eat. Try  to do this before you eat.  Decide how much of the food you plan to eat.  Write down what you ate and how many calories it had. Doing this is called keeping a food log.  To successfully lose weight, it is important to balance calorie counting with a healthy lifestyle that includes regular activity. Aim for 150 minutes of moderate exercise (such as walking) or 75 minutes of vigorous exercise (such as running) each week. Where do I find calorie information?  The number of calories in a food can be found on a Nutrition Facts label. If a food does not have a Nutrition Facts label, try to look up the calories online or ask your dietitian for help. Remember that calories are listed per serving. If you choose to have more than one serving of a food, you will have to multiply the calories per serving by the amount of servings you plan to eat. For example, the label on a package of bread might say that a serving size is 1 slice and that there are 90 calories in a serving. If you eat 1 slice, you will have eaten 90 calories. If you eat 2 slices, you will have eaten 180 calories. How do I keep a food log? Immediately after each meal, record the following information in  your food log:  What you ate. Don't forget to include toppings, sauces, and other extras on the food.  How much you ate. This can be measured in cups, ounces, or number of items.  How many calories each food and drink had.  The total number of calories in the meal.  Keep your food log near you, such as in a small notebook in your pocket, or use a mobile app or website. Some programs will calculate calories for you and show you how many calories you have left for the day to meet your goal. What are some calorie counting tips?  Use your calories on foods and drinks that will fill you up and not leave you hungry: ? Some examples of foods that fill you up are nuts and nut butters, vegetables, lean proteins, and high-fiber foods like whole  grains. High-fiber foods are foods with more than 5 g fiber per serving. ? Drinks such as sodas, specialty coffee drinks, alcohol, and juices have a lot of calories, yet do not fill you up.  Eat nutritious foods and avoid empty calories. Empty calories are calories you get from foods or beverages that do not have many vitamins or protein, such as candy, sweets, and soda. It is better to have a nutritious high-calorie food (such as an avocado) than a food with few nutrients (such as a bag of chips).  Know how many calories are in the foods you eat most often. This will help you calculate calorie counts faster.  Pay attention to calories in drinks. Low-calorie drinks include water and unsweetened drinks.  Pay attention to nutrition labels for "low fat" or "fat free" foods. These foods sometimes have the same amount of calories or more calories than the full fat versions. They also often have added sugar, starch, or salt, to make up for flavor that was removed with the fat.  Find a way of tracking calories that works for you. Get creative. Try different apps or programs if writing down calories does not work for you. What are some portion control tips?  Know how many calories are in a serving. This will help you know how many servings of a certain food you can have.  Use a measuring cup to measure serving sizes. You could also try weighing out portions on a kitchen scale. With time, you will be able to estimate serving sizes for some foods.  Take some time to put servings of different foods on your favorite plates, bowls, and cups so you know what a serving looks like.  Try not to eat straight from a bag or box. Doing this can lead to overeating. Put the amount you would like to eat in a cup or on a plate to make sure you are eating the right portion.  Use smaller plates, glasses, and bowls to prevent overeating.  Try not to multitask (for example, watch TV or use your computer) while eating. If  it is time to eat, sit down at a table and enjoy your food. This will help you to know when you are full. It will also help you to be aware of what you are eating and how much you are eating. What are tips for following this plan? Reading food labels  Check the calorie count compared to the serving size. The serving size may be smaller than what you are used to eating.  Check the source of the calories. Make sure the food you are eating is high in vitamins  and protein and low in saturated and trans fats. Shopping  Read nutrition labels while you shop. This will help you make healthy decisions before you decide to purchase your food.  Make a grocery list and stick to it. Cooking  Try to cook your favorite foods in a healthier way. For example, try baking instead of frying.  Use low-fat dairy products. Meal planning  Use more fruits and vegetables. Half of your plate should be fruits and vegetables.  Include lean proteins like poultry and fish. How do I count calories when eating out?  Ask for smaller portion sizes.  Consider sharing an entree and sides instead of getting your own entree.  If you get your own entree, eat only half. Ask for a box at the beginning of your meal and put the rest of your entree in it so you are not tempted to eat it.  If calories are listed on the menu, choose the lower calorie options.  Choose dishes that include vegetables, fruits, whole grains, low-fat dairy products, and lean protein.  Choose items that are boiled, broiled, grilled, or steamed. Stay away from items that are buttered, battered, fried, or served with cream sauce. Items labeled "crispy" are usually fried, unless stated otherwise.  Choose water, low-fat milk, unsweetened iced tea, or other drinks without added sugar. If you want an alcoholic beverage, choose a lower calorie option such as a glass of wine or light beer.  Ask for dressings, sauces, and syrups on the side. These are  usually high in calories, so you should limit the amount you eat.  If you want a salad, choose a garden salad and ask for grilled meats. Avoid extra toppings like bacon, cheese, or fried items. Ask for the dressing on the side, or ask for olive oil and vinegar or lemon to use as dressing.  Estimate how many servings of a food you are given. For example, a serving of cooked rice is  cup or about the size of half a baseball. Knowing serving sizes will help you be aware of how much food you are eating at restaurants. The list below tells you how big or small some common portion sizes are based on everyday objects: ? 1 oz-4 stacked dice. ? 3 oz-1 deck of cards. ? 1 tsp-1 die. ? 1 Tbsp- a ping-pong ball. ? 2 Tbsp-1 ping-pong ball. ?  cup- baseball. ? 1 cup-1 baseball. Summary  Calorie counting means keeping track of how many calories you eat and drink each day. If you eat fewer calories than your body needs, you should lose weight.  A healthy amount of weight to lose per week is usually 1-2 lb (0.5-0.9 kg). This usually means reducing your daily calorie intake by 500-750 calories.  The number of calories in a food can be found on a Nutrition Facts label. If a food does not have a Nutrition Facts label, try to look up the calories online or ask your dietitian for help.  Use your calories on foods and drinks that will fill you up, and not on foods and drinks that will leave you hungry.  Use smaller plates, glasses, and bowls to prevent overeating. This information is not intended to replace advice given to you by your health care provider. Make sure you discuss any questions you have with your health care provider. Document Released: 09/02/2005 Document Revised: 08/02/2016 Document Reviewed: 08/02/2016 Elsevier Interactive Patient Education  2017 [employer].

## 2017-08-22 ENCOUNTER — Telehealth: Payer: Self-pay | Admitting: Family Medicine

## 2017-08-22 ENCOUNTER — Encounter: Payer: Self-pay | Admitting: Family Medicine

## 2017-08-22 LAB — OPIATE SCREEN, URINE: Opiate Scrn, Ur: NEGATIVE ng/mL

## 2017-08-22 NOTE — Assessment & Plan Note (Signed)
Established problem Controlled Continue current therapy regiment.  

## 2017-08-22 NOTE — Telephone Encounter (Signed)
I spoke with Richard Glass about the urine opiate screen not showing opiates. He reports taking his hydrocodone/apap every day, several times a day.  I explained the reason we perform urine testing is to look for evidence of diversion of controlled usbstances.  He denied giving away or selling his hydrocodone/apap.  He agreed to a serum opiate (chromatography) next office visit. If he does not show evidence of opiates on this test, I informed him I would be unable to prescribe controlled substances for him.  He expressed understanding.

## 2017-08-22 NOTE — Assessment & Plan Note (Signed)
Establshed problem Given involvement of left temporal and parietal regions, impairment of making long-term memories would not be unlikely.  If this impairment shows itself to be a barrier to long term care of patient, establishing a case manager to help him remember and follow up diagnoses that require close, long term follow up may be necessary.

## 2017-08-22 NOTE — Assessment & Plan Note (Signed)
Established problem Controlled Continue current therapy regiment. 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

## 2017-08-22 NOTE — Progress Notes (Signed)
Subjective:    Patient ID: Richard Glass, male    DOB: 1970-12-25, 46 y.o.   MRN: [id number] Richard Glass is alone, though his mother drove him and she is also here for appointment with physician Sources of clinical information for visit is/are patient and past medical records.  I reviewed Care Everywhere notes from Richard Glass (Ortho at [facility]). Nursing assessment for this office visit was reviewed with the patient for accuracy and revision.  Depression screen PHQ 2/9 08/21/2017  Decreased Interest 0  Down, Depressed, Hopeless 0  PHQ - 2 Score 0  Altered sleeping -  Tired, decreased energy -  Change in appetite -  Feeling bad or failure about yourself  -  Trouble concentrating -  Moving slowly or fidgety/restless -  Suicidal thoughts -  PHQ-9 Score -  Some encounter information is confidential and restricted. Go to Review Flowsheets activity to see all data.  Some recent data might be hidden   Fall Risk  07/08/2017 05/01/2017 02/28/2017 02/13/2017 01/02/2017  Falls in the past year? No Yes No No No  Comment - - - - -  Number falls in past yr: - 1 - - -  Injury with Fall? - No - - -  Risk for fall due to : - - - - -  Some encounter information is confidential and restricted. Go to Review Flowsheets activity to see all data.    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 - Manged by Richard Glass at [facility]. 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 - Improved acute pain, still with chronic back pain that interferes with ADLs and iADLs - He is able to perform ADLs and iADLs with use of hydrocodone-APAP 5/325 mg  QID prn.  - Denies constipation, confusion, and falls.  - denies alcohol and illicit drug use.  He says he took a Norco  this morning and takes Norco between three to four times a day every day.  He denies taking more than prescribed.    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 Richard Glass, [facility]) for evaluation of fibular tumor. Richard Glass assessment was of an Enchondroma. His recommendation was for a repeat Xray in 3 months to assess for change in the fibula.  The  ov note is available in Care Everywhere (06/17/16) - Unfortunately, Richard Glass was upset at the office visit and did not return.  - On my first office visit with Richard Glass, in reviewing his problem list, he denied having a bone tumor.  Today, he denies memory of seeing Richard Glass and of being told he had an abnormal knee MRI (though there is a phone note by Richard Glass documenting him speaking with patient about the MRI abnormality and his plan to refer him to an orthopedist.   - Richard Glass reports pain in his lateral left knee.     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       Medium   Chronic pain syndrome    Relevant Medications   HYDROcodone-acetaminophen (NORCO/VICODIN) 5-325 MG tablet   HYDROcodone-acetaminophen (NORCO) 5-325 MG tablet   HYDROcodone-acetaminophen (NORCO) 5-325 MG tablet   Other Relevant Orders   Opiate Screen, Urine     Unprioritized   Encounter for chronic pain management   Relevant Medications   HYDROcodone-acetaminophen (NORCO/VICODIN) 5-325 MG tablet   HYDROcodone-acetaminophen (NORCO) 5-325 MG  tablet   HYDROcodone-acetaminophen (NORCO) 5-325 MG tablet   Other Relevant Orders   Opiate Screen, Urine   SI (sacroiliac) pain   Relevant Medications   HYDROcodone-acetaminophen (NORCO/VICODIN) 5-325 MG tablet   HYDROcodone-acetaminophen (NORCO) 5-325 MG tablet   HYDROcodone-acetaminophen (NORCO) 5-325 MG tablet    Other Visit Diagnoses    Overweight with body mass index (BMI) 25.0-29.9         SH: no smoking     Review of Systems (+) numbness in bilateral lower legs and feet No incontinence of bowel or bladder No headache No fever      Objective:   Physical Exam VS reviewed GEN: Alert, Cooperative, Groomed, NAD COR: RRR, No M/G/R, No JVD, Normal PMI size and location LUNGS: BCTA, No Acc mm use, speaking in full sentences EXT: No peripheral leg edema. TTP lateral left knee Neuro: Oriented to person, place, and time;  Gait: Normal speed, No significant path deviation, Step through +,  Psych: Normal affect/thought/speech slight distortion/language-concrete    Assessment & Plan:  See problem list

## 2017-08-22 NOTE — Assessment & Plan Note (Signed)
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 Pt consulted with Dr Julieta Bellini. 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  Dr Leonides Schanz recommended pt f/u with him in 3 months for Xray to see if there are changes in left fibula.  Pt was upset with Dr Guido Sander office and chose not to return.  Pt lacked memory of having an abnormal knee MRI, and going to Dr Leonides Schanz at [facility].  Though he said he did not want to go "again" to [facility]. Richard Glass agreed to ask Dr D. Brooks, his orthopedist, for his opinion about the abnormal knee MRI fibular finding and his recommendations for its management.  Richard Glass is to see Dr Rolena Infante next week to follow up his recent SI joint surgery.  I sent a message via [employer] to Dr Rolena Infante alerting him to this new clinical question.

## 2017-08-22 NOTE — Assessment & Plan Note (Addendum)
Established problem. Adequate analgesia: Yes Activity benefit: Able to perform iADLs and ADLs with hydrocodone-apap 5/325 mg QID prn Adverse effects: no Aberrant behavior: No evidence of docotr shopping in [place] with  CSRS Central sensitization: Yes, on topiramate and gabapentin Continue current therapy:      Rx for 3 printed hydrocodone 5/325 #100 tab: #1 fill now, #2 fill in 30 days, #3 fill 60 days.     Continue gabapentin and topiramate.  Needs UDS for opiates next ov in three months.  If unable to give urine, will obtain serum testing.

## 2017-08-28 ENCOUNTER — Other Ambulatory Visit: Payer: Self-pay | Admitting: Neurology

## 2017-08-28 NOTE — Telephone Encounter (Signed)
Faxed printed/signed rx alprazolam to [facility] at [phone]. Received fax confirmation.

## 2017-10-18 DIAGNOSIS — Z981 Arthrodesis status: Secondary | ICD-10-CM | POA: Diagnosis not present

## 2017-10-30 ENCOUNTER — Other Ambulatory Visit: Payer: Self-pay | Admitting: Neurology

## 2017-10-30 NOTE — Telephone Encounter (Signed)
Faxed printed/signed phenobarbital to [facility] [place] at [phone]. Received fax confirmation.

## 2017-11-15 DIAGNOSIS — Z981 Arthrodesis status: Secondary | ICD-10-CM | POA: Diagnosis not present

## 2017-11-20 ENCOUNTER — Ambulatory Visit (INDEPENDENT_AMBULATORY_CARE_PROVIDER_SITE_OTHER): Payer: Medicare Other | Admitting: Family Medicine

## 2017-11-20 ENCOUNTER — Other Ambulatory Visit: Payer: Self-pay

## 2017-11-20 ENCOUNTER — Encounter: Payer: Self-pay | Admitting: Family Medicine

## 2017-11-20 VITALS — BP 120/64 | HR 58 | Temp 97.6°F | Ht 68.0 in | Wt 167.0 lb

## 2017-11-20 DIAGNOSIS — G9389 Other specified disorders of brain: Secondary | ICD-10-CM

## 2017-11-20 DIAGNOSIS — M542 Cervicalgia: Secondary | ICD-10-CM

## 2017-11-20 DIAGNOSIS — G8929 Other chronic pain: Secondary | ICD-10-CM | POA: Diagnosis not present

## 2017-11-20 DIAGNOSIS — G2581 Restless legs syndrome: Secondary | ICD-10-CM | POA: Diagnosis not present

## 2017-11-20 DIAGNOSIS — G894 Chronic pain syndrome: Secondary | ICD-10-CM

## 2017-11-20 DIAGNOSIS — D492 Neoplasm of unspecified behavior of bone, soft tissue, and skin: Secondary | ICD-10-CM

## 2017-11-20 DIAGNOSIS — J309 Allergic rhinitis, unspecified: Secondary | ICD-10-CM | POA: Diagnosis not present

## 2017-11-20 DIAGNOSIS — D169 Benign neoplasm of bone and articular cartilage, unspecified: Secondary | ICD-10-CM

## 2017-11-20 DIAGNOSIS — Z79899 Other long term (current) drug therapy: Secondary | ICD-10-CM

## 2017-11-20 DIAGNOSIS — M533 Sacrococcygeal disorders, not elsewhere classified: Secondary | ICD-10-CM | POA: Diagnosis not present

## 2017-11-20 DIAGNOSIS — M5440 Lumbago with sciatica, unspecified side: Secondary | ICD-10-CM

## 2017-11-20 DIAGNOSIS — Z23 Encounter for immunization: Secondary | ICD-10-CM | POA: Diagnosis not present

## 2017-11-20 DIAGNOSIS — S61209A Unspecified open wound of unspecified finger without damage to nail, initial encounter: Secondary | ICD-10-CM | POA: Diagnosis not present

## 2017-11-20 MED ORDER — HYDROCODONE-ACETAMINOPHEN 5-325 MG PO TABS: 1.0000 | ORAL_TABLET | Freq: Four times a day (QID) | ORAL | 0 refills | Status: DC | PRN

## 2017-11-20 NOTE — Patient Instructions (Signed)
Your Blood pressure is good.

## 2017-11-21 ENCOUNTER — Encounter: Payer: Self-pay | Admitting: Family Medicine

## 2017-11-21 LAB — OPIATE SCREEN, URINE: Opiate Scrn, Ur: POSITIVE ng/mL — AB

## 2017-11-21 LAB — BASIC METABOLIC PANEL
BUN/Creatinine Ratio: 9 (ref 9–20)
BUN: 8 mg/dL (ref 6–24)
CO2: 22 mmol/L (ref 20–29)
Calcium: 9.2 mg/dL (ref 8.7–10.2)
Chloride: 105 mmol/L (ref 96–106)
Creatinine, Ser: 0.85 mg/dL (ref 0.76–1.27)
GFR calc Af Amer: 119 mL/min/{1.73_m2} (ref >59)
GFR calc non Af Amer: 103 mL/min/{1.73_m2} (ref >59)
Glucose: 80 mg/dL (ref 65–99)
Potassium: 4.2 mmol/L (ref 3.5–5.2)
Sodium: 141 mmol/L (ref 134–144)

## 2017-11-21 NOTE — Assessment & Plan Note (Signed)
See pt instructions

## 2017-11-21 NOTE — Assessment & Plan Note (Signed)
Established problem Adequate pain contol to allow ADLs and some iADLs No evidence of dr shopping on review of [place] CSRS 11/19/17 Urine opiate screen obtained.  Refill of hydrocodone for three monthly prescriptions; now, 30 d, and 60 days.  online

## 2017-11-21 NOTE — Assessment & Plan Note (Signed)
Established problem Controlled Continue current therapy regiment.  

## 2017-11-21 NOTE — Progress Notes (Signed)
Subjective:    Patient ID: Richard Glass, male    DOB: [birth year], 47 y.o.   MRN: [id number] Richard Glass is alone, though his mother drove him and she is also here for appointment with physician Sources of clinical information for visit is/are patient and past medical records.  I reviewed Care Everywhere notes from Dr Marquette Saa (Ortho at [facility]) office visit 10/16/17. Summary: Dx of fibular lesion as enchondroma of bone.  He believes most of pt's pain is radicular from his spine and SI joint.  He does not believe any further workup.   Knee 4-V Xray 10/16/17 at [place]: No acute fracture or malalignment.  Vague lucency in the fibular neck with no aggressive bone destruction or periosteal reaction.  Nursing assessment for this office visit was reviewed with the patient for accuracy and revision.  Depression screen PHQ 2/9 11/20/2017  Decreased Interest 0  Down, Depressed, Hopeless 0  PHQ - 2 Score 0  Altered sleeping -  Tired, decreased energy -  Change in appetite -  Feeling bad or failure about yourself  -  Trouble concentrating -  Moving slowly or fidgety/restless -  Suicidal thoughts -  PHQ-9 Score -  Some encounter information is confidential and restricted. Go to Review Flowsheets activity to see all data.  Some recent data might be hidden   Fall Risk  07/08/2017 05/01/2017 02/28/2017 02/13/2017 01/02/2017  Falls in the past year? No Yes No No No  Comment - - - - -  Number falls in past yr: - 1 - - -  Injury with Fall? - No - - -  Risk for fall due to : - - - - -  Some encounter information is confidential and restricted. Go to Review Flowsheets activity to see all data.    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 - Manged by Dr Jannifer Franklin at [facility]. - Pt taking Penobarb, topiramate, gabpentin - No reported seizures since last ov - No falls or confusion   Back pain of lumbar region with sciatica -  longstanding - S/P recent Left SI joint fusion by Dr D. Brooks. - Improved acute pain, still with chronic back pain that interferes with ADLs and iADLs - He is able to perform ADLs and iADLs with use of hydrocodone-APAP 5/325 mg  QID prn.  - Denies constipation, confusion, and falls.  - denies alcohol and illicit drug use.  He says he took a Norco this morning and takes Norco between three to four times a day every day.  He denies taking more than prescribed.  Patient receives Xanax 0.5 TID Rx from Dr Jannifer Franklin for tremor.    Relevant Medications   HYDROcodone-acetaminophen (NORCO/VICODIN) 5-325 MG tablet   HYDROcodone-acetaminophen (NORCO) 5-325 MG tablet   HYDROcodone-acetaminophen (NORCO) 5-325 MG tablet   Bone tumor - Primary  I reviewed Care Everywhere notes from Dr Marquette Saa (Ortho at [facility]) office visit 10/16/17. Summary: Dx of fibular lesion as enchondroma of bone.  He believes most of pt's pain is radicular from his spine and SI joint.  He does not believe any further workup.   Knee 4-V Xray 10/16/17 at [place]: No acute fracture or malalignment.  Vague lucency in the fibular neck with no aggressive bone destruction or periosteal reaction.       Medium   Chronic pain syndrome Adequate pain control in low back.  Able to  perform ADLs and some iADLs.  He denies complications such as constipation or stumbling/falls, confusion Not using Xanax as IV. Denies use illicit drug and of alcohol.   Relevant Medications   HYDROcodone-acetaminophen (NORCO/VICODIN) 5-325 MG tablet   HYDROcodone-acetaminophen (NORCO) 5-325 MG tablet   HYDROcodone-acetaminophen (NORCO) 5-325 MG tablet   Other Relevant Orders   Opiate Screen, Urine    Right hand finger cut - Woke up with doral index finger cut.  No recall of injury.  No swelling or pain.  SH: smoking daily  Review of Systems  Musculoskeletal: Positive for back pain.   (+) numbness in bilateral lower legs and feet No incontinence of bowel or  bladder No headache No fever      Objective:   Physical Exam VS reviewed GEN: Alert, Cooperative, Groomed, NAD COR: RRR, No M/G/R, No JVD, Normal PMI size and location LUNGS: BCTA, No Acc mm use, speaking in full sentences EXT: No peripheral leg edema. TTP lateral left knee. 1 cm 2 mm gap eliptic lac right palmar index finger without erythema or ttp.  No granualtion base yet.   Neuro: Oriented to person, place, and time;  Gait: Normal speed, No significant path deviation, Step through +,  Psych: Normal affect/thought/speech slight distortion/language-concrete

## 2017-11-21 NOTE — Assessment & Plan Note (Signed)
Established problem Benign lesion by Dr Leonides Schanz at [facility] ortho No further workup unless clinical change.  Then repeat knee MRI

## 2017-11-21 NOTE — Assessment & Plan Note (Signed)
See patient instructions of assessment and recommendations

## 2017-12-11 ENCOUNTER — Ambulatory Visit: Payer: Medicare Other | Admitting: Family Medicine

## 2017-12-16 DIAGNOSIS — Z981 Arthrodesis status: Secondary | ICD-10-CM | POA: Diagnosis not present

## 2018-01-05 ENCOUNTER — Other Ambulatory Visit: Payer: Self-pay | Admitting: Neurology

## 2018-01-15 DIAGNOSIS — Z981 Arthrodesis status: Secondary | ICD-10-CM | POA: Diagnosis not present

## 2018-02-12 ENCOUNTER — Encounter: Payer: Self-pay | Admitting: Family Medicine

## 2018-02-12 ENCOUNTER — Other Ambulatory Visit: Payer: Self-pay | Admitting: Family Medicine

## 2018-02-12 ENCOUNTER — Ambulatory Visit (INDEPENDENT_AMBULATORY_CARE_PROVIDER_SITE_OTHER): Payer: Medicare Other | Admitting: Family Medicine

## 2018-02-12 ENCOUNTER — Other Ambulatory Visit: Payer: Self-pay

## 2018-02-12 VITALS — BP 124/68 | HR 52 | Temp 98.0°F | Ht 68.0 in | Wt 171.0 lb

## 2018-02-12 DIAGNOSIS — M542 Cervicalgia: Secondary | ICD-10-CM

## 2018-02-12 DIAGNOSIS — Z79899 Other long term (current) drug therapy: Secondary | ICD-10-CM | POA: Diagnosis not present

## 2018-02-12 DIAGNOSIS — G894 Chronic pain syndrome: Secondary | ICD-10-CM | POA: Diagnosis not present

## 2018-02-12 DIAGNOSIS — L259 Unspecified contact dermatitis, unspecified cause: Secondary | ICD-10-CM | POA: Diagnosis not present

## 2018-02-12 DIAGNOSIS — G2581 Restless legs syndrome: Secondary | ICD-10-CM

## 2018-02-12 DIAGNOSIS — M533 Sacrococcygeal disorders, not elsewhere classified: Secondary | ICD-10-CM

## 2018-02-12 DIAGNOSIS — M5440 Lumbago with sciatica, unspecified side: Secondary | ICD-10-CM

## 2018-02-12 MED ORDER — HYDROCODONE-ACETAMINOPHEN 5-325 MG PO TABS: 1.0000 | ORAL_TABLET | Freq: Four times a day (QID) | ORAL | 0 refills | Status: DC | PRN

## 2018-02-12 MED ORDER — TRIAMCINOLONE ACETONIDE 0.5 % EX OINT: TOPICAL_OINTMENT | CUTANEOUS | 1 refills | Status: DC

## 2018-02-12 MED ORDER — PRAMOXINE HCL 1 % EX GEL: CUTANEOUS | 99 refills | Status: DC

## 2018-02-12 NOTE — Patient Instructions (Signed)
The refills of your hydrocodone-acetaminophen have been sent to you pharmacy.  There is one to fill when your current fill runs out, then one to fill in 30 days and one to fill in 60 days.   I believe your have eczema patches on your arm and foot.  They likely started like an allergic reaction to something that touch your skin.   Start rubbing triamcinolone ointment into the spots of itching skin on your arm and foot.   Spend at least a minute rubbing the ointment into the skin.  This will make the medication work even better.   It may take a couple weeks for the itching to go away.  Don't give up if the itching does not go away quickly.  It is going to take time to calm down the injured skin.    Your pharmacist will help you find an the over-the-counter medication that numbs the it;ching skin within a few minutes of applying it.   The medication contains Pramoxine.  Itch-X is the name of one medication containing Pramoxine. You can use this medication any time you feel the strong urge to itch.

## 2018-02-13 ENCOUNTER — Encounter: Payer: Self-pay | Admitting: Family Medicine

## 2018-02-13 DIAGNOSIS — L259 Unspecified contact dermatitis, unspecified cause: Secondary | ICD-10-CM | POA: Insufficient documentation

## 2018-02-13 HISTORY — DX: Unspecified contact dermatitis, unspecified cause: L25.9

## 2018-02-13 NOTE — Progress Notes (Signed)
   Subjective:    Patient ID: Richard Glass, male    DOB: Feb 11, 1971, 47 y.o.   MRN: [id number] Richard Glass is accompanied by his mother Sources of clinical information for visit is/are patient and past medical records.   Nursing assessment for this office visit was reviewed with the patient for accuracy and revision.  Depression screen PHQ 2/9 02/12/2018  Decreased Interest 0  Down, Depressed, Hopeless 0  PHQ - 2 Score 0  Altered sleeping -  Tired, decreased energy -  Change in appetite -  Feeling bad or failure about yourself  -  Trouble concentrating -  Moving slowly or fidgety/restless -  Suicidal thoughts -  PHQ-9 Score -  Some recent data might be hidden   Fall Risk  07/08/2017 05/01/2017 02/28/2017 02/13/2017 01/02/2017  Falls in the past year? No Yes No No No  Comment - - - - -  Number falls in past yr: - 1 - - -  Injury with Fall? - No - - -  Risk for fall due to : - - - - -  Some encounter information is confidential and restricted. Go to Review Flowsheets activity to see all data.     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 Brooks. - Improved acute pain, still with chronic back pain that interferes with ADLs and iADLs - He is able to perform ADLs and iADLs with use of hydrocodone-APAP 5/325 mg  QID prn.  - Denies constipation, confusion, and falls.  - denies alcohol and illicit drug use.  He says he took a Norco this morning and takes Norco between three to four times a day every day.  He denies taking more than prescribed.  Patient receives Xanax 0.5 TID Rx from Dr Jannifer Franklin for tremor.    Relevant Medications   HYDROcodone-acetaminophen (NORCO/VICODIN) 5-325 MG tablet   HYDROcodone-acetaminophen (NORCO) 5-325 MG tablet   HYDROcodone-acetaminophen (NORCO) 5-325 MG tablet     Chronic pain syndrome Adequate pain control in low back.  Able to perform ADLs and some iADLs.  He denies complications such as constipation or stumbling/falls, confusion Not using Xanax as IV. Denies use illicit drug and of alcohol.    R  Review of Systems  Musculoskeletal: Positive for back pain.  Skin: Positive for rash.         Objective:   Physical Exam VS reviewed GEN: Alert, Cooperative, Groomed, NAD COR: RRR, No M/G/R, No JVD, Normal PMI size and location LUNGS: BCTA, No Acc mm use, speaking in full sentences EXT: No peripheral leg edema. TTP lateral left knee. 1 cm 2 mm gap eliptic lac right palmar index finger without erythema or ttp.  No granualtion base yet.   Neuro: Oriented to person, place, and time;  Gait: Normal speed, No significant path deviation, Step through +,  Psych: Normal affect/thought/speech slight distortion/language-concrete

## 2018-02-13 NOTE — Assessment & Plan Note (Signed)
Established problem Controlled Review of [place] showed no evidence of doctor shopping.  Benzo prescribed by patient's neurologist. Continue current therapy regiment. Three Rx for Norco Disp #100, fill now, in 30 days and 60 days.

## 2018-02-13 NOTE — Assessment & Plan Note (Signed)
New problem Rx triamcinoloe oint daily until resolve OTC pramoxine for itching relief.

## 2018-02-19 ENCOUNTER — Encounter: Payer: Self-pay | Admitting: Family Medicine

## 2018-02-19 ENCOUNTER — Ambulatory Visit (INDEPENDENT_AMBULATORY_CARE_PROVIDER_SITE_OTHER): Payer: Medicare Other | Admitting: Family Medicine

## 2018-02-19 ENCOUNTER — Other Ambulatory Visit: Payer: Self-pay

## 2018-02-19 VITALS — BP 132/74 | HR 56 | Temp 97.7°F | Ht 68.0 in | Wt 164.0 lb

## 2018-02-19 DIAGNOSIS — Z87820 Personal history of traumatic brain injury: Secondary | ICD-10-CM

## 2018-02-19 DIAGNOSIS — Z741 Need for assistance with personal care: Secondary | ICD-10-CM

## 2018-02-19 NOTE — Patient Instructions (Signed)
I believe you are able to manage your own money.  I wrote a letter that supports you being allowed to manage your disability money.

## 2018-02-23 ENCOUNTER — Encounter: Payer: Self-pay | Admitting: Family Medicine

## 2018-02-23 DIAGNOSIS — Z87820 Personal history of traumatic brain injury: Secondary | ICD-10-CM | POA: Insufficient documentation

## 2018-02-23 NOTE — Progress Notes (Signed)
   Subjective:    Patient ID: Richard Glass, male    DOB: 06-06-1971, 47 y.o.   MRN: [id number] Richard Glass is accompanied by mother Sources of clinical information for visit is/are patient, parent and past medical records. Nursing assessment for this office visit was reviewed with the patient for accuracy and revision.  Previous Report(s) Reviewed: historical medical records  Patient's mother is his guardian. Depression screen PHQ 2/9 02/19/2018  Decreased Interest 0  Down, Depressed, Hopeless 0  PHQ - 2 Score 0  Altered sleeping -  Tired, decreased energy -  Change in appetite -  Feeling bad or failure about yourself  -  Trouble concentrating -  Moving slowly or fidgety/restless -  Suicidal thoughts -  PHQ-9 Score -  Some recent data might be hidden   Fall Risk  07/08/2017 05/01/2017 02/28/2017 02/13/2017 01/02/2017  Falls in the past year? No Yes No No No  Comment - - - - -  Number falls in past yr: - 1 - - -  Injury with Fall? - No - - -  Risk for fall due to : - - - - -  Some encounter information is confidential and restricted. Go to Review Flowsheets activity to see all data.      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 - Patient receives approximately $1200 monthly in SS- Disability. This money is managed by his mother by administrative decision. - Mother and patient requesting assessment and recommendation concerning his capacity to manage his own finances.  His mother would like him to resume managing his own fiances at make advanced planning for her life.      Review of Systems     Objective:   Physical Exam  Assessment of ability to handle personal finance 1) Pt was able to provide $13 in cash in three different ways 2) Pt was able to provide change for a payment of $20  For a $ 13 dollar purchase 3) Patient was able to correctly write a check for $400 and then able to correctly record the withdrawal  in a bank book. 4) Patient was able to identify methods of financial fraud, e.g., telephone scams, and described an appropriate response to them. 5) Patient was able to described a prudent prioritizing payment of his bills should his funds were to low to pay all his bills, ie., prioritize payment of rent over cable bill.  6) Patient described an appropriate response to a situation in which he thought a bill or bank statement were inaccurate.  7) Patient identified his major monthly income and expenses.   8) Patient denied being in any conflict with others over how he spends his money.        Assessment & Plan:  30 minutes face to face where spent in total with counseling / coordination of care took more than 50% of the total time. Counseling involved discussion with patientand his mother about the test results, and completion of letter for patient to take to DSS to request return of right to manage his own finances.

## 2018-02-23 NOTE — Assessment & Plan Note (Signed)
Richard Glass succesfuly performed tasks involving using cash to pay for an item and how to make change.  He was able to complete a check correctly and record it in a balance book.  He knows his source of income and how much he receives each month. He knows his major expenses each month and is able to prioritize which bills must be paid first if his bank balance were low.  He understood the concept of fraud and could give examples of it. He understood how to contest a bill that was inaccurate.   It is my opinion that Richard Glass has the ability to manage his finances independently, including management of his social security disability income.

## 2018-03-09 ENCOUNTER — Other Ambulatory Visit: Payer: Self-pay | Admitting: Neurology

## 2018-03-09 NOTE — Telephone Encounter (Signed)
Rx registry checked. Last fill date 02/08/18 for #90. Last OV was 04/25/2017 and next OV is 04/27/2018.

## 2018-03-10 NOTE — Telephone Encounter (Signed)
Rx sent electronically.  

## 2018-03-26 ENCOUNTER — Encounter: Payer: Self-pay | Admitting: Family Medicine

## 2018-03-26 ENCOUNTER — Ambulatory Visit (INDEPENDENT_AMBULATORY_CARE_PROVIDER_SITE_OTHER): Payer: Medicare Other | Admitting: Family Medicine

## 2018-03-26 ENCOUNTER — Other Ambulatory Visit: Payer: Self-pay

## 2018-03-26 VITALS — BP 118/64 | HR 53 | Temp 97.9°F | Wt 150.8 lb

## 2018-03-26 DIAGNOSIS — T63441A Toxic effect of venom of bees, accidental (unintentional), initial encounter: Secondary | ICD-10-CM

## 2018-03-26 MED ORDER — HYDROXYZINE HCL 10 MG PO TABS: 10.0000 mg | ORAL_TABLET | Freq: Three times a day (TID) | ORAL | 1 refills | Status: DC | PRN

## 2018-03-26 NOTE — Patient Instructions (Addendum)
I believe this may have been a sting from a insect in the Bee family, like honey bees, wasps, or yellow jackets.   Keep the wrap around the sting area unitl your put on the   Triamcinolone ointment.  Then re-wrap the site.   It may take a couple of weeks for the sting area to heal completely.   Take the hydroxyzine tablet to help with itching and stinging in the sting area.  Watch out as it can make you drwsy.  Do not operate heavy equipment until you know how the hydroxyzine affects you.

## 2018-03-27 NOTE — Progress Notes (Signed)
Subjective:    Richard Glass is a 47 y.o. male seen in consultation for evaluation of possible hymenoptera sting. He has had a single adverse reaction to an unknown insect sting. His most recent reaction occurred 2 week ago after a single sting to the ankle . At the site of the sting there was immediate and delayed   local swelling.  Maximal swelling occurred in 2 hours and was approximately 2.0 cm in size. The swelling lasted for 2 days. There were no systemic symptoms. Treatment topical triamcinolone ointment. Signs and symptoms of the reaction progressively improved over 1 days. [place] such as clearing land and yard work.   Previous evaluation has included: no previous evaluation has been done.    The following portions of the patient's history were reviewed and updated as appropriate: allergies, current medications, past medical history and problem list.  Environmental History: unremarkable Review of Systems Respiratory: negative for asthma Gastrointestinal: negative for diarrhea Allergic/Immunologic: negative for anaphylaxis, angioedema and urticaria    Objective:    General appearance: alert, cooperative and no distress Lungs: clear to auscultation bilaterally Heart: regular rate and rhythm, S1, S2 normal, no murmur, click, rub or gallop Skin: Skin color, texture, turgor normal. No rashes or lesions or excoriation - ankle(s) right and macular - ankle(s) right   Assessment:    History of large local reaction, further testing not required.    Plan:    Medications: atarax prn and continue topical triamcinolone and cover wound with gauze wrap to prevent scratching sting site.

## 2018-04-07 ENCOUNTER — Ambulatory Visit (HOSPITAL_COMMUNITY)
Admission: RE | Admit: 2018-04-07 | Discharge: 2018-04-07 | Disposition: A | Discharge status: Home / Self Care | Payer: Medicare Other | Source: Ambulatory Visit | Attending: Family Medicine | Admitting: Family Medicine

## 2018-04-07 ENCOUNTER — Other Ambulatory Visit: Payer: Self-pay

## 2018-04-07 ENCOUNTER — Encounter: Payer: Self-pay | Admitting: Student in an Organized Health Care Education/Training Program

## 2018-04-07 ENCOUNTER — Ambulatory Visit (INDEPENDENT_AMBULATORY_CARE_PROVIDER_SITE_OTHER): Payer: Medicare Other | Admitting: Student in an Organized Health Care Education/Training Program

## 2018-04-07 DIAGNOSIS — R109 Unspecified abdominal pain: Secondary | ICD-10-CM

## 2018-04-07 MED ORDER — KETOROLAC TROMETHAMINE 30 MG/ML IJ SOLN: 30.0000 mg | Freq: Once | INTRAMUSCULAR | Status: DC

## 2018-04-07 MED ORDER — KETOROLAC TROMETHAMINE 30 MG/ML IJ SOLN
30.0000 mg | Freq: Once | INTRAMUSCULAR | Status: AC
  Administered 2018-04-07: 30 mg via INTRAMUSCULAR

## 2018-04-07 NOTE — Patient Instructions (Signed)
It was a pleasure seeing you today in our clinic.   We drew blood work at today's visit. Please get an Xray done. I will call  with these results. If you do not hear from me within the next week, please give our office a call.  Our clinic's number is [phone]. Please call with questions or concerns about what we discussed today.  Be well, Dr. Burr Medico

## 2018-04-07 NOTE — Assessment & Plan Note (Addendum)
Most likely MSK/soft tissue pain vs pain related to chronic degenerative joint disease. Less likely acute injury. Also considered early shingles. Cannot rule out secondary gain with patient's medication list, declining to give a urine sample for UA and behavior in the office. UA to r/o pyelo, check for hematuria which may suggest nephrolithiasis >>patient refused to give urine CMET CXR to r/o bony abnormalitiy 30 mg toradol IM given in office Return precuations if symptoms worsen or if he develops a zoster-like rash Will follow up with results over phone

## 2018-04-07 NOTE — Progress Notes (Signed)
   CC: right flank pain  HPI: Richard Glass is a 47 y.o. male with PMH significant for chronic pain, narcotic use, TBI, who presents to [facility] today with right flank pain  of 2 weeks duration.   Patient reports gradual onset of pain 2 weeks ago, acutely worsened over the past 3 days. He reports pain is worst with bending over. He cannot recall any injury or trauma. Denies N/V/D/C. No prior abdominal surgeries. Does report decreased appetite. Denies dysuria, burning with urination, or urinary incontinence.  Review of Symptoms:  See HPI for ROS.   CC, SH/smoking status, and VS noted.  Objective: BP 110/68   Pulse 76   Temp 98 F (36.7 C)   Ht 5\' 9"  (1.753 m)   Wt 160 lb (72.6 kg)   SpO2 99%   BMI 23.63 kg/m  GEN: Patient becomes distressed when lying on the exam table, begins crying out of pain ABD: soft, nt, nd, normoactive bowel sounds MSK: +ttp over right flank/right side with brushing the skin. No gross visible deformity. SKIN: No overlying rash or lesions in area of tenderness BACK: tender to palpation in mid to lower lumbar regions but no limited range of motion GU: no CVA tenderness  Assessment and plan:  Right flank pain Most likely MSK/soft tissue pain vs pain related to chronic degenerative joint disease. Less likely acute injury. Also considered early shingles. Cannot rule out secondary gain with patient's medication list, declining to give a urine sample for UA and behavior in the office. UA to r/o pyelo, check for hematuria which may suggest nephrolithiasis >>patient refused to give urine CMET CXR to r/o bony abnormalitiy 30 mg toradol IM given in office Return precuations if symptoms worsen or if he develops a zoster-like rash Will follow up with results over phone    Orders Placed This Encounter  Procedures  . DG Chest 2 View    Standing Status:   Future    Number of Occurrences:   1    Standing Expiration Date:   06/07/2019    Order Specific Question:    Reason for Exam (SYMPTOM  OR DIAGNOSIS REQUIRED)    Answer:   right side pain    Order Specific Question:   Preferred imaging location?    Answer:   [facility]  . Comprehensive metabolic panel    Order Specific Question:   Has the patient fasted?    Answer:   No  . POCT urinalysis dipstick    Meds ordered this encounter  Medications  . DISCONTD: ketorolac (TORADOL) 30 MG/ML injection 30 mg  . ketorolac (TORADOL) 30 MG/ML injection 30 mg     Everrett Coombe, MD,MS,  PGY3 04/08/2018 4:15 AM

## 2018-04-08 LAB — COMPREHENSIVE METABOLIC PANEL
ALT: 12 IU/L (ref 0–44)
AST: 13 IU/L (ref 0–40)
Albumin/Globulin Ratio: 2.4 — ABNORMAL HIGH (ref 1.2–2.2)
Albumin: 4.6 g/dL (ref 3.5–5.5)
Alkaline Phosphatase: 76 IU/L (ref 39–117)
BUN/Creatinine Ratio: 7 — ABNORMAL LOW (ref 9–20)
BUN: 6 mg/dL (ref 6–24)
Bilirubin Total: <0.2 mg/dL (ref 0.0–1.2)
CO2: 22 mmol/L (ref 20–29)
Calcium: 8.9 mg/dL (ref 8.7–10.2)
Chloride: 106 mmol/L (ref 96–106)
Creatinine, Ser: 0.84 mg/dL (ref 0.76–1.27)
GFR calc Af Amer: 119 mL/min/{1.73_m2} (ref >59)
GFR calc non Af Amer: 103 mL/min/{1.73_m2} (ref >59)
Globulin, Total: 1.9 g/dL (ref 1.5–4.5)
Glucose: 52 mg/dL — ABNORMAL LOW (ref 65–99)
Potassium: 4.4 mmol/L (ref 3.5–5.2)
Sodium: 144 mmol/L (ref 134–144)
Total Protein: 6.5 g/dL (ref 6.0–8.5)

## 2018-04-10 ENCOUNTER — Telehealth: Payer: Self-pay | Admitting: *Deleted

## 2018-04-10 NOTE — Telephone Encounter (Signed)
-----   Message from Everrett Coombe, MD sent at 04/08/2018 11:33 AM EDT ----- Please call and inform patient that his kidney function and liver function were normal on his bloodwork. His xray was also normal.

## 2018-04-10 NOTE — Telephone Encounter (Signed)
Pt informed of below stated he was still in pain and has appointment on 8/15.  Told him if he felt like it he needed to be seen sooner to call and we would get him an appointment. Katharina Caper, Toney Difatta D, [place]

## 2018-04-27 ENCOUNTER — Ambulatory Visit: Payer: Medicare Other | Admitting: Adult Health

## 2018-04-27 ENCOUNTER — Encounter: Payer: Self-pay | Admitting: Adult Health

## 2018-04-27 VITALS — BP 112/68 | HR 59 | Ht 69.0 in | Wt 162.0 lb

## 2018-04-27 DIAGNOSIS — Z5181 Encounter for therapeutic drug level monitoring: Secondary | ICD-10-CM | POA: Diagnosis not present

## 2018-04-27 DIAGNOSIS — R569 Unspecified convulsions: Secondary | ICD-10-CM | POA: Diagnosis not present

## 2018-04-27 DIAGNOSIS — G252 Other specified forms of tremor: Secondary | ICD-10-CM

## 2018-04-27 MED ORDER — PHENOBARBITAL 64.8 MG PO TABS: 194.4000 mg | ORAL_TABLET | Freq: Every day | ORAL | 0 refills | Status: DC

## 2018-04-27 MED ORDER — ALPRAZOLAM 0.5 MG PO TABS: 0.5000 mg | ORAL_TABLET | Freq: Three times a day (TID) | ORAL | 1 refills | Status: DC | PRN

## 2018-04-27 MED ORDER — TOPIRAMATE 50 MG PO TABS: ORAL_TABLET | ORAL | 3 refills | Status: DC

## 2018-04-27 NOTE — Patient Instructions (Signed)
Your Plan:  Continue Phenobarbital and Topamax Continue Xanax Blood work today If your symptoms worsen or you develop new symptoms please let us know.   Thank you for coming to see [place] at [facility]. I hope we have been able to provide you high quality care today.  You may receive a patient satisfaction survey over the next few weeks. We would appreciate your feedback and comments so that we may continue to improve ourselves and the health of our patients.

## 2018-04-27 NOTE — Progress Notes (Signed)
PATIENT: Richard Glass DOB: 04/24/1971  REASON FOR VISIT: follow up HISTORY FROM: patient  HISTORY OF PRESENT ILLNESS: Today 04/27/18:  Richard Glass is a 47 year old male with a history of seizures.  He returns today for follow-up.  He states that he has had any true seizure events.  He reports that he has had a couple of episodes where he felt as if he may have a seizure.  He states that he became overheated and lightheaded and his tremor in the hands got worse.  He continues to live at home.  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  He operates a Teacher, music without difficulty.  He is able to complete all ADLs.  He reports a tremor in his hands have improved.  He continues on phenobarbital and Topamax.  He continues to take the department twice a day as needed.  HISTORY Richard Glass is a 47 year old right-handed white male with a history of seizures that have been well controlled, he has not had a seizure in greater than 3 or 4 years. He is operating a motor vehicle without difficulty. He recently had fusion of the left SI joint done by Dr. Rolena Infante on 03/20/2017. He is still getting over this and has a lot of back pain, is not bearing any weight with his left leg. He is walking with a walker. The patient indicates that he has ongoing problems with tremors. His primary care physician placed him on Mysoline and worked him up to 100 mg twice daily. He has run out of the medication and he has not been on Mysoline for one week. He is on gabapentin as well taking 900 mg 3 times daily, but this was reduced to 900 mg twice daily 6 weeks ago. The patient indicates that his tremors prevent him from doing a lot of things with his hands, he indicates that he will drop platelets or drop a coffee pot because of the tremor. The patient has not had any recent falls. He returns to this office for an evaluation. In the past, he claims alprazolam helped his tremors more than anything. The patient remains on Topamax taking  50 mg the morning and 100 mg in the evening. He remains on phenobarbital taking the 68 mg tablet, 3 daily.   REVIEW OF SYSTEMS: Out of a complete 14 system review of symptoms, the patient complains only of the following symptoms, and all other reviewed systems are negative.  Dizziness, headache, numbness, seizures, weakness, back pain restless leg, itching  ALLERGIES: Allergies  Allergen Reactions  . No Known Allergies     HOME MEDICATIONS: Outpatient Medications Prior to Visit  Medication Sig Dispense Refill  . ALPRAZolam (XANAX) 0.5 MG tablet TAKE 1 TABLET BY MOUTH THREE TIMES A DAY AS NEEDED 90 tablet 1  . cetirizine (ZYRTEC) 10 MG tablet Take 1 tablet (10 mg total) by mouth daily. (Patient taking differently: Take 10 mg by mouth daily as needed. ) 90 tablet prn  . fluticasone (FLONASE) 50 MCG/ACT nasal spray Place 2 sprays daily into both nostrils. 48 g 3  . gabapentin (NEURONTIN) 300 MG capsule Take 3 capsules (900 mg total) by mouth 2 (two) times daily. (Patient taking differently: Take 900 mg by mouth 3 (three) times daily. ) 540 capsule 5  . HYDROcodone-acetaminophen (NORCO/VICODIN) 5-325 MG tablet Take 1 tablet by mouth every 6 (six) hours as needed. for pain 100 tablet 0  . PHENobarbital (LUMINAL) 64.8 MG tablet TAKE 3 TABLETS BY MOUTH AT  BEDTIME 270 tablet 1  . topiramate (TOPAMAX) 50 MG tablet Take one tablet in the morning and two tablets in the evening 270 tablet 3  . HYDROcodone-acetaminophen (NORCO) 5-325 MG tablet Take 1 tablet by mouth every 6 (six) hours as needed for moderate pain. 100 tablet 0  . HYDROcodone-acetaminophen (NORCO) 5-325 MG tablet Take 1 tablet by mouth every 6 (six) hours as needed for moderate pain. Fill 30 days after date of prescription 100 tablet 0  . hydrOXYzine (ATARAX/VISTARIL) 10 MG tablet Take 1 tablet (10 mg total) by mouth 3 (three) times daily as needed. 42 tablet 1  . pramoxine (PROCTOFOAM) 1 % foam RUB IN WELL TO SKIN WHEN IT ITCHES. CAN  USE EVERY HOUR OR TWO. 120 g 1  . triamcinolone ointment (KENALOG) 0.5 % Rub in well about two pea size amounts of ointment twice a day into itching skin of arm and foot. 30 g 1   No facility-administered medications prior to visit.     PAST MEDICAL HISTORY: Past Medical History:  Diagnosis Date  . 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  . Allergic rhinitis 11/13/2006       . Anxiety   . Anxiety state 08/08/2013  . Asthma   . ASTHMA, INTERMITTENT 07/08/2010   Qualifier: Diagnosis of  By: Walker Kehr MD, Patrick Jupiter    . Back pain of lumbar region with sciatica 11/13/2006   MRI 12/2014 - lumbar DDD without focal neural impingement  MRI Lumbar 10/30/16 ([place]) Small central L5-S1 disc extrusion with minimal cranial migration and associated annular fissure approaches descending left S1 nerve roots without contact or displacement. Minimal bulging disc height loss without spinal canal stenosis or neural foraminal narrowing.  Minimal bulging disc at L2-L3 through L4-L5 without spinal canal stenosis or neural foraminal narrowing.  MRI Sacrum 10/30/16 ([place]) No fracture. Small lesion left sacral ala most likely a subchondral cyst/erosion adjacent to the sacroiliac joint.     . 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 Carpal tunnel syndrome 01/09/2015   Left 12/2014   . Cervical spine pain 02/06/2015   MRI 03/2015 FINDINGS: Vertebral body height, signal and alignment are normal. The craniocervical junction is normal and cervical cord signal is normal. The central spinal canal and neural foramina are widely patent at all levels. Scattered, mild degenerative change appears most notable at C4-5. Imaged paraspinous structures are unremarkable.  IMPRESSION: Negative for central canal or foraminal narrowing. No finding to explain the patient's symptoms. Scattered, mild facet degenerative disease is  noted.   . Chronic low back pain   . Chronic pain 01/18/2016  . Chronic pain syndrome 01/18/2016  . Coccyx pain 02/06/2015  . 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   . Degenerative arthritis   . Dyslipidemia   . External hemorrhoid, bleeding 06/04/2011  . GASTROESOPHAGEAL REFLUX, NO ESOPHAGITIS 11/13/2006   Qualifier: Diagnosis of  By: Eusebio Friendly    . Headache(784.0)   . Hyperlipidemia 11/13/2006   07/2016  ASCVD score of 4.7% - recommended lifestyle changes   . Insomnia 02/02/2013  . Intention tremor 10/31/2009   Qualifier: Diagnosis of  By: Walker Kehr MD, Patrick Jupiter    . Intercostal pain 04/25/2015  . Left knee injury 05/09/2016  . 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 Morton's neuroma of left foot 01/18/2016  . Osteopenia 10/10/2016  .  Pain in joint, shoulder region 07/05/2014   LEFT > Right Reports hx rotator cuff surgery on rigt shoulder previously (Dr Nicholes Stairs) but no notes available XRAY right shoulder showed some proliferative changes distal rigt clavicle   . Rash and nonspecific skin eruption 10/10/2016  . Restless leg 09/29/2007   Qualifier: Diagnosis of  By: Walker Kehr MD, Patrick Jupiter    . Restless legs syndrome (RLS)   . Sciatica of left side 10/26/2014  . Seizures ([place])    last >78yrs  . SI (sacroiliac) pain 03/20/2017  . Sinusitis chronic, frontal   . Status post lumbar spinal fusion 03/20/2017  . Subacromial or subdeltoid bursitis 08/08/2009   MRI C-spine 2011([facility]) - normal MRI left shoulder 10/2010 ([facility]) - AC degenerative disease, normal rotator cuff 12/2012 -debridement, acromioplasty and distal clavicle excision of left shoulder, arthroscopy also performed an no need for rotator cuff repair - performed by [facility] 02/2013 - Nerve Conduction Studies ([facility]) - ulnar nerve compression 02/2013- taken back to OR for Ulnar nerve decompression 06/2013 Repeat MRI left shoulder - subacromial/subdeltoid  bursitis, a small intersubstance tear to the distal infraspinatus and evidence of interval resection of the distal clavicle 06/2013 - steroid injection of left biceps 09/2013 -Repeat EMG showed improvement of ulnar nerve compression 10/2013 - referred to [facility] for second opinion due to intractable pain 11/2013 - Seen by Dr. Marlou Sa at [facility]; discussed risks/benefits of surgical intervention and bursectomy, no plan for surgery at this time     . 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 [place]a   struck by golf ball-47 yrs old  . CRANIOTOMY     metal plate  . NASAL FRACTURE SURGERY  1997  . SACROILIAC JOINT FUSION Left 03/20/2017   Procedure: SACROILIAC JOINT FUSION;  Surgeon: Melina Schools, MD;  Location: [place];  Service: Orthopedics;  Laterality: Left;  90 mins  . SHOULDER ARTHROSCOPY Left    "cleaned arthritis out"  . SHOULDER SURGERY Right 2012   Rotator cuff surgery  . TONSILLECTOMY    . UVULOPALATOPHARYNGOPLASTY (UPPP)/TONSILLECTOMY/SEPTOPLASTY  2001    FAMILY HISTORY: Family History  Problem Relation Age of Onset  . Cancer Father        lung  . Heart disease Father   . Hypertension Mother   . Seizures Neg Hx     SOCIAL HISTORY: Social History   Socioeconomic History  . Marital status: Single    Spouse name: Not on file  . Number of children: 0  . Years of education: 48  . Highest education level: Not on file  Occupational History  . Occupation: disabled  Social Needs  . Financial resource strain: Not on file  . Food insecurity:    Worry: Not on file    Inability: Not on file  . Transportation needs:    Medical: Not on file    Non-medical: Not on file  Tobacco Use  . Smoking status: Former Smoker    Packs/day: 1.00    Years: 22.00    Pack years: 22.00     Types: Cigarettes    Last attempt to quit: 06/22/2012    Years since quitting: 5.8  . Smokeless tobacco: Current User    Types: Snuff  Substance and Sexual Activity  . Alcohol use: No    Alcohol/week: 0.0  standard drinks    Comment: none since 7/11  . Drug use: No  . Sexual activity: Never  Lifestyle  . Physical activity:    Days per week: Not on file    Minutes per session: Not on file  . Stress: Not on file  Relationships  . Social connections:    Talks on phone: Not on file    Gets together: Not on file    Attends religious service: Not on file    Active member of club or organization: Not on file    Attends meetings of clubs or organizations: Not on file    Relationship status: Not on file  . Intimate partner violence:    Fear of current or ex partner: Not on file    Emotionally abused: Not on file    Physically abused: Not on file    Forced sexual activity: Not on file  Other Topics Concern  . 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  Generalized: Well developed, in no acute distress   Neurological examination  Mentation: Alert oriented to time, place, history taking. Follows all commands speech and language fluent Cranial nerve II-XII: Pupils were equal round reactive to light. Extraocular movements were full, visual field were full on confrontational test. Facial sensation and strength were normal. Uvula tongue midline. Head turning and shoulder shrug  were normal and symmetric. Motor: The motor testing reveals 5 over 5 strength of all 4 extremities. Good symmetric motor tone is noted throughout.  Sensory: Sensory testing is intact to  soft touch on all 4 extremities. No evidence of extinction is noted.  Coordination: Cerebellar testing reveals good finger-nose-finger and heel-to-shin bilaterally.  Gait and station: Gait is normal.  Reflexes: Deep tendon reflexes are symmetric but depressed  DIAGNOSTIC DATA (LABS, IMAGING, TESTING) - I reviewed patient records, labs, notes, testing and imaging myself where available.  Lab Results  Component Value Date   WBC 6.7 05/05/2017   HGB 14.2 05/05/2017   HCT 43.4 05/05/2017   MCV 92.1 05/05/2017   PLT 278 05/05/2017      Component Value Date/Time   NA 144 04/07/2018 1040   K 4.4 04/07/2018 1040   CL 106 04/07/2018 1040   CO2 22 04/07/2018 1040   GLUCOSE 52 (L) 04/07/2018 1040   GLUCOSE 86 05/05/2017 1124   BUN 6 04/07/2018 1040   CREATININE 0.84 04/07/2018 1040   CREATININE 0.73 07/26/2016 0911   CALCIUM 8.9 04/07/2018 1040   PROT 6.5 04/07/2018 1040   ALBUMIN 4.6 04/07/2018 1040   AST 13 04/07/2018 1040   ALT 12 04/07/2018 1040   ALKPHOS 76 04/07/2018 1040   BILITOT <0.2 04/07/2018 1040   GFRNONAA 103 04/07/2018 1040   GFRNONAA >89 07/26/2016 0911   GFRAA 119 04/07/2018 1040   GFRAA >89 07/26/2016 0911   Lab Results  Component Value Date   CHOL 238 (H) 07/26/2016   HDL 41 07/26/2016   LDLCALC 160 (H) 07/26/2016   LDLDIRECT 90 06/18/2011   TRIG 187 (H) 07/26/2016   CHOLHDL 5.8 (  H) 07/26/2016   Lab Results  Component Value Date   HGBA1C 5.1 12/17/2011   No results found for: VITAMINB12 Lab Results  Component Value Date   TSH 1.353 07/06/2013      ASSESSMENT AND PLAN 47 y.o. year old male  has a past medical history of Abnormal head CT (05/2011), Allergic rhinitis (11/13/2006), Anxiety, Anxiety state (08/08/2013), Asthma, ASTHMA, INTERMITTENT (07/08/2010), Back pain of lumbar region with sciatica (11/13/2006), Bone tumor (05/17/2016), Carpal tunnel syndrome (01/09/2015), Cervical spine pain (02/06/2015), Chronic low back pain, Chronic pain (01/18/2016),  Chronic pain syndrome (01/18/2016), Coccyx pain (02/06/2015), Convulsions ([place]) (11/13/2006), Degenerative arthritis, Dyslipidemia, External hemorrhoid, bleeding (06/04/2011), GASTROESOPHAGEAL REFLUX, NO ESOPHAGITIS (11/13/2006), Headache(784.0), Hyperlipidemia (11/13/2006), Insomnia (02/02/2013), Intention tremor (10/31/2009), Intercostal pain (04/25/2015), Left knee injury (05/09/2016), 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 here with:  1.  Seizures 2.  Tremor  Overall the patient has remained stable.  He will continue on phenobarbital and Topamax.  I will check blood work today.  His tremor has improved.  He is advised that he has any seizure events he should let us know.  He will follow-up in 6 months or sooner if needed.   Ward Givens, MSN, NP-C 04/27/2018, 10:59 AM [facility] [address], [place], [place] [address] [phone]

## 2018-04-27 NOTE — Progress Notes (Signed)
I have read the note, and I agree with the clinical assessment and plan.  Esme Freund K Margrete Delude   

## 2018-04-28 ENCOUNTER — Telehealth: Payer: Self-pay

## 2018-04-28 LAB — CBC WITH DIFFERENTIAL/PLATELET
Basophils Absolute: 0 10*3/uL (ref 0.0–0.2)
Basos: 0 %
EOS (ABSOLUTE): 0.1 10*3/uL (ref 0.0–0.4)
Eos: 2 %
Hematocrit: 42.3 % (ref 37.5–51.0)
Hemoglobin: 13.9 g/dL (ref 13.0–17.7)
Immature Grans (Abs): 0 10*3/uL (ref 0.0–0.1)
Immature Granulocytes: 0 %
Lymphocytes Absolute: 2.2 10*3/uL (ref 0.7–3.1)
Lymphs: 39 %
MCH: 31 pg (ref 26.6–33.0)
MCHC: 32.9 g/dL (ref 31.5–35.7)
MCV: 94 fL (ref 79–97)
Monocytes Absolute: 0.4 10*3/uL (ref 0.1–0.9)
Monocytes: 6 %
Neutrophils Absolute: 2.9 10*3/uL (ref 1.4–7.0)
Neutrophils: 53 %
Platelets: 256 10*3/uL (ref 150–450)
RBC: 4.49 x10E6/uL (ref 4.14–5.80)
RDW: 13.9 % (ref 12.3–15.4)
WBC: 5.6 10*3/uL (ref 3.4–10.8)

## 2018-04-28 LAB — COMPREHENSIVE METABOLIC PANEL
ALT: 11 IU/L (ref 0–44)
AST: 10 IU/L (ref 0–40)
Albumin/Globulin Ratio: 2.3 — ABNORMAL HIGH (ref 1.2–2.2)
Albumin: 4.6 g/dL (ref 3.5–5.5)
Alkaline Phosphatase: 68 IU/L (ref 39–117)
BUN/Creatinine Ratio: 7 — ABNORMAL LOW (ref 9–20)
BUN: 6 mg/dL (ref 6–24)
Bilirubin Total: <0.2 mg/dL (ref 0.0–1.2)
CO2: 25 mmol/L (ref 20–29)
Calcium: 9.6 mg/dL (ref 8.7–10.2)
Chloride: 103 mmol/L (ref 96–106)
Creatinine, Ser: 0.86 mg/dL (ref 0.76–1.27)
GFR calc Af Amer: 118 mL/min/{1.73_m2} (ref >59)
GFR calc non Af Amer: 102 mL/min/{1.73_m2} (ref >59)
Globulin, Total: 2 g/dL (ref 1.5–4.5)
Glucose: 76 mg/dL (ref 65–99)
Potassium: 4.5 mmol/L (ref 3.5–5.2)
Sodium: 141 mmol/L (ref 134–144)
Total Protein: 6.6 g/dL (ref 6.0–8.5)

## 2018-04-28 LAB — TOPIRAMATE LEVEL: Topiramate Lvl: 4.1 ug/mL (ref 2.0–25.0)

## 2018-04-28 LAB — PHENOBARBITAL LEVEL: Phenobarbital, Serum: 31 ug/mL (ref 15–40)

## 2018-04-28 NOTE — Telephone Encounter (Signed)
-----   Message from Ward Givens, NP sent at 04/28/2018  3:25 PM EDT ----- Blood work is relatively unremarkable.  Consistent with previous blood work.  Please call patient with results

## 2018-04-28 NOTE — Telephone Encounter (Signed)
I contacted the patient and advised of results from recent lab results. Patient verbalized understanding and had no further questions at this time. MB RN.

## 2018-04-30 ENCOUNTER — Ambulatory Visit (INDEPENDENT_AMBULATORY_CARE_PROVIDER_SITE_OTHER): Payer: Medicare Other | Admitting: Family Medicine

## 2018-04-30 ENCOUNTER — Encounter: Payer: Self-pay | Admitting: Family Medicine

## 2018-04-30 VITALS — BP 112/64 | HR 56 | Temp 97.7°F | Wt 161.0 lb

## 2018-04-30 DIAGNOSIS — G894 Chronic pain syndrome: Secondary | ICD-10-CM

## 2018-04-30 DIAGNOSIS — M5440 Lumbago with sciatica, unspecified side: Secondary | ICD-10-CM

## 2018-04-30 DIAGNOSIS — W57XXXA Bitten or stung by nonvenomous insect and other nonvenomous arthropods, initial encounter: Secondary | ICD-10-CM

## 2018-04-30 DIAGNOSIS — M533 Sacrococcygeal disorders, not elsewhere classified: Secondary | ICD-10-CM | POA: Diagnosis not present

## 2018-04-30 DIAGNOSIS — M8588 Other specified disorders of bone density and structure, other site: Secondary | ICD-10-CM

## 2018-04-30 DIAGNOSIS — G8929 Other chronic pain: Secondary | ICD-10-CM

## 2018-04-30 DIAGNOSIS — L309 Dermatitis, unspecified: Secondary | ICD-10-CM | POA: Diagnosis not present

## 2018-04-30 DIAGNOSIS — S50869A Insect bite (nonvenomous) of unspecified forearm, initial encounter: Secondary | ICD-10-CM

## 2018-04-30 MED ORDER — TRIAMCINOLONE ACETONIDE 0.1 % EX CREA: 1.0000 "application " | TOPICAL_CREAM | Freq: Two times a day (BID) | CUTANEOUS | 0 refills | Status: DC

## 2018-04-30 MED ORDER — CALCIUM CARB-CHOLECALCIFEROL 600-800 MG-UNIT PO TABS
1.0000 | ORAL_TABLET | Freq: Two times a day (BID) | ORAL | 99 refills | Status: DC
Start: 2018-04-30 — End: 2018-06-22

## 2018-04-30 NOTE — Patient Instructions (Signed)
Your bones are thin on Xrays.  This is called osteoporosis.  This could mean you have a condition called osteoporosis where your bones can break easier as you get older.   I believe your bones may have thinned because of the phenoparbital medication you have taken for many years.  We are setting you up to have a special Xray test to see if you do have osteoporosis (thin bones). The test is called a DXA scan.    I recommend you start taking an Extra-strength Tums tablet twice a day with meals.  We are checking your Vitamin D level.  This can be low in people with thin bones.   Bone Densitometry Bone densitometry is an imaging test that uses a special X-ray to measure the amount of calcium and other minerals in your bones (bone density). This test is also known as a bone mineral density test or dual-energy X-ray absorptiometry (DXA). The test can measure bone density at your hip and your spine. It is similar to having a regular X-ray. You may have this test to:  Diagnose a condition that causes weak or thin bones (osteoporosis).  Predict your risk of a broken bone (fracture).  Determine how well osteoporosis treatment is working.  Tell a health care provider about:  Any allergies you have.  All medicines you are taking, including vitamins, herbs, eye drops, creams, and over-the-counter medicines.  Any problems you or family members have had with anesthetic medicines.  Any blood disorders you have.  Any surgeries you have had.  Any medical conditions you have.  Possibility of pregnancy.  Any other medical test you had within the previous 14 days that used contrast material. What are the risks? Generally, this is a safe procedure. However, problems can occur and may include the following:  This test exposes you to a very small amount of radiation.  The risks of radiation exposure may be greater to unborn children.  What happens before the procedure?  Do not take any calcium  supplements for 24 hours before having the test. You can otherwise eat and drink what you usually do.  Take off all metal jewelry, eyeglasses, dental appliances, and any other metal objects. What happens during the procedure?  You may lie on an exam table. There will be an X-ray generator below you and an imaging device above you.  Other devices, such as boxes or braces, may be used to position your body properly for the scan.  You will need to lie still while the machine slowly scans your body.  The images will show up on a computer monitor. What happens after the procedure? You may need more testing at a later time. This information is not intended to replace advice given to you by your health care provider. Make sure you discuss any questions you have with your health care provider. Document Released: 09/24/2004 Document Revised: 02/08/2016 Document Reviewed: 02/10/2014 Elsevier Interactive Patient Education  2018 [employer].

## 2018-05-01 ENCOUNTER — Encounter: Payer: Self-pay | Admitting: Family Medicine

## 2018-05-01 LAB — VITAMIN D 25 HYDROXY (VIT D DEFICIENCY, FRACTURES): Vit D, 25-Hydroxy: 32 ng/mL (ref 30.0–100.0)

## 2018-05-01 NOTE — Assessment & Plan Note (Signed)
New problem Recommend wearing long sleeves and use insect repellant when outdoors working Rx Topical triamcinolone 0.1% crm daily prn to sites of itching bites

## 2018-05-01 NOTE — Assessment & Plan Note (Signed)
Established problem Controlled Review of [place]CCSRS showed no evidence of doctor shopping.  Benzo prescribed by patient's neurologist. Continue current therapy regiment. Plan to refill Norco for 3 motnhs when request comes around 05/05/18

## 2018-05-01 NOTE — Assessment & Plan Note (Signed)
Established problem Adequate pain control with Norco 3 to4  Times daily Plan to Fill  next Norco 3 refills around 05/05/18 request.

## 2018-05-01 NOTE — Assessment & Plan Note (Signed)
New issiue Further workup. Risk factor for osteoporosis is chronic phenobarb therapy. Sent for DEXA, labs 25OH Vit D,  Recommend taking Vitmin D3 1000 IU daily along with Extra-strength tums twice a day with meals.

## 2018-05-01 NOTE — Progress Notes (Signed)
Subjective:    Patient ID: Richard Glass, male    DOB: Jul 20, 1971, 47 y.o.   MRN: [id number] Richard Glass is accompanied by mother Sources of clinical information for visit is/are patient and past medical records. Nursing assessment for this office visit was reviewed with the patient for accuracy and revision.  Previous Report(s) Reviewed: historical medical records  Depression screen [facility] 2/9 04/07/2018  Decreased Interest 0  Down, Depressed, Hopeless 0  PHQ - 2 Score 0  Altered sleeping -  Tired, decreased energy -  Change in appetite -  Feeling bad or failure about yourself  -  Trouble concentrating -  Moving slowly or fidgety/restless -  Suicidal thoughts -  PHQ-9 Score -  Some recent data might be hidden   Fall Risk  07/08/2017 05/01/2017 02/28/2017 02/13/2017 01/02/2017  Falls in the past year? No Yes No No No  Comment - - - - -  Number falls in past yr: - 1 - - -  Injury with Fall? - No - - -  Risk for fall due to : - - - - -  Some encounter information is confidential and restricted. Go to Review Flowsheets activity to see all data.    HPI  Problem List Items Addressed This Visit      High   Back pain of lumbar region with sciatica - Primary - Acute exac or right thoracic posterolateral back pain - onset about 5 weeks ago - stabel course.  - Improves with wearing rib belt.  CXR did not show fracture    Osteopenia - First found on Cervical spine XR 07/2016. - Takes no Vitamin D or calcium supplement.  Minimal food rich in calcium intake.  - No reported fragility fractures - No falls.  Works outside most days.    Relevant Medications   Calcium Carb-Cholecalciferol (CALCIUM 600+D3) 600-800 MG-UNIT TABS   Other Relevant Orders   DG Bone Density   VITAMIN D 25 Hydroxy (Vit-D Deficiency, Fractures) (Completed)     Medium   Chronic pain syndrome -  Longstanding pain in lumbar region with Left SI joint fusion. - Able to perform ADLs and iADLs with Norco 5/325  QID prn.  Takes Norco 3 to 4 times a day every day - Denies taking more Norco than prescribed - Pt takes Xanax TID for tremor prescribed by Neurology (Dr Jannifer Franklin) - Denies constipation, confusion and falls -Denies use of alcohol nor illicit drugs.       Unprioritized   Encounter for chronic pain management - Review of NCCSRS showed no evi;dence of doctor shopping.      Other Visit Diagnoses    Dermatitis  - "bumps' on right forearm - pruritis - present for couple days.  - not spreading.       Relevant Medications   triamcinolone cream (KENALOG) 0.1 %     SH: No smoking  Review of Systems    See HPI Objective:   Physical Exam VS reviewed GEN: Alert, Cooperative, Groomed, NAD HEENT: PERRL; EAC bilaterally not occluded, TM's translucent with normal LM, (+) LR;                No cervical LAN, No thyromegaly, No palpable masses COR: RRR, No M/G/R, No JVD, Normal PMI size and location LUNGS: BCTA, No Acc mm use, speaking in full sentences Gait: Normal speed, No significant path deviation, Step through +,  Psych: Normal affect/thought/speech/language    Assessment & Plan:  Visit Problem List with A/P  Back pain of lumbar region with sciatica Established problem Adequate pain control with Norco 3 to4  Times daily Plan to Fill  next Norco 3 refills around 05/05/18 request.  Osteopenia New issiue Further workup. Risk factor for osteoporosis is chronic phenobarb therapy. Sent for DEXA, labs 25OH Vit D,  Recommend taking Vitmin D3 1000 IU daily along with Extra-strength tums twice a day with meals.    Chronic pain syndrome Established problem Controlled Review of NCCSRS showed no evidence of doctor shopping.  Benzo prescribed by patient's neurologist. Continue current therapy regiment. Plan to refill Norco for 3 motnhs when request comes around 05/05/18  Insect bites New problem Recommend wearing long sleeves and use insect repellant when outdoors working Rx Topical  triamcinolone 0.1% crm daily prn to sites of itching bites

## 2018-05-19 ENCOUNTER — Telehealth: Payer: Self-pay | Admitting: Family Medicine

## 2018-05-19 DIAGNOSIS — G894 Chronic pain syndrome: Secondary | ICD-10-CM

## 2018-05-19 DIAGNOSIS — M5440 Lumbago with sciatica, unspecified side: Secondary | ICD-10-CM

## 2018-05-19 DIAGNOSIS — M533 Sacrococcygeal disorders, not elsewhere classified: Secondary | ICD-10-CM

## 2018-05-19 DIAGNOSIS — M542 Cervicalgia: Secondary | ICD-10-CM

## 2018-05-19 DIAGNOSIS — G2581 Restless legs syndrome: Secondary | ICD-10-CM

## 2018-05-19 NOTE — Telephone Encounter (Signed)
Patient needs authorization for pharmacy for Hydrocodone, [facility] on [address].

## 2018-05-20 MED ORDER — HYDROCODONE-ACETAMINOPHEN 5-325 MG PO TABS
1.0000 | ORAL_TABLET | Freq: Four times a day (QID) | ORAL | 0 refills | Status: DC | PRN
Start: 2018-05-20 — End: 2018-06-25

## 2018-06-04 ENCOUNTER — Other Ambulatory Visit: Payer: Self-pay | Admitting: Family Medicine

## 2018-06-22 ENCOUNTER — Other Ambulatory Visit: Payer: Self-pay | Admitting: Family Medicine

## 2018-06-22 DIAGNOSIS — M8588 Other specified disorders of bone density and structure, other site: Secondary | ICD-10-CM

## 2018-06-25 ENCOUNTER — Encounter: Payer: Self-pay | Admitting: Family Medicine

## 2018-06-25 ENCOUNTER — Other Ambulatory Visit: Payer: Self-pay

## 2018-06-25 ENCOUNTER — Ambulatory Visit (INDEPENDENT_AMBULATORY_CARE_PROVIDER_SITE_OTHER): Payer: Medicare Other | Admitting: Family Medicine

## 2018-06-25 VITALS — BP 118/70 | HR 53 | Ht 69.0 in | Wt 163.0 lb

## 2018-06-25 DIAGNOSIS — L309 Dermatitis, unspecified: Secondary | ICD-10-CM

## 2018-06-25 DIAGNOSIS — W57XXXA Bitten or stung by nonvenomous insect and other nonvenomous arthropods, initial encounter: Secondary | ICD-10-CM

## 2018-06-25 DIAGNOSIS — M542 Cervicalgia: Secondary | ICD-10-CM

## 2018-06-25 DIAGNOSIS — G894 Chronic pain syndrome: Secondary | ICD-10-CM | POA: Diagnosis not present

## 2018-06-25 DIAGNOSIS — S50869A Insect bite (nonvenomous) of unspecified forearm, initial encounter: Secondary | ICD-10-CM

## 2018-06-25 DIAGNOSIS — M533 Sacrococcygeal disorders, not elsewhere classified: Secondary | ICD-10-CM

## 2018-06-25 DIAGNOSIS — S76312A Strain of muscle, fascia and tendon of the posterior muscle group at thigh level, left thigh, initial encounter: Secondary | ICD-10-CM

## 2018-06-25 DIAGNOSIS — G2581 Restless legs syndrome: Secondary | ICD-10-CM | POA: Diagnosis not present

## 2018-06-25 MED ORDER — HYDROCODONE-ACETAMINOPHEN 5-325 MG PO TABS: 1.0000 | ORAL_TABLET | Freq: Four times a day (QID) | ORAL | 0 refills | Status: DC | PRN

## 2018-06-25 MED ORDER — TRIAMCINOLONE ACETONIDE 0.1 % EX CREA: 1.0000 "application " | TOPICAL_CREAM | Freq: Two times a day (BID) | CUTANEOUS | 3 refills | Status: DC

## 2018-06-25 NOTE — Patient Instructions (Signed)
Use Ice and Tylenol for the pain in your hamstrings.

## 2018-06-26 ENCOUNTER — Encounter: Payer: Self-pay | Admitting: Family Medicine

## 2018-06-26 DIAGNOSIS — S76312A Strain of muscle, fascia and tendon of the posterior muscle group at thigh level, left thigh, initial encounter: Secondary | ICD-10-CM

## 2018-06-26 DIAGNOSIS — S76319A Strain of muscle, fascia and tendon of the posterior muscle group at thigh level, unspecified thigh, initial encounter: Secondary | ICD-10-CM | POA: Insufficient documentation

## 2018-06-26 HISTORY — DX: Strain of muscle, fascia and tendon of the posterior muscle group at thigh level, left thigh, initial encounter: S76.312A

## 2018-06-26 NOTE — Progress Notes (Signed)
   Subjective:    Patient ID: Richard Glass, male    DOB: October 31, 1970, 47 y.o.   MRN: [id number] Richard Glass is accompanied by mother Sources of clinical information for visit is/are patient and past medical records. Nursing assessment for this office visit was reviewed with the patient for accuracy and revision.  Previous Report(s) Reviewed: historical medical records  Depression screen [facility] 2/9 06/25/2018  Decreased Interest 0  Down, Depressed, Hopeless 0  PHQ - 2 Score 0  Altered sleeping -  Tired, decreased energy -  Change in appetite -  Feeling bad or failure about yourself  -  Trouble concentrating -  Moving slowly or fidgety/restless -  Suicidal thoughts -  PHQ-9 Score -  Some recent data might be hidden   Fall Risk  07/08/2017 05/01/2017 02/28/2017 02/13/2017 01/02/2017  Falls in the past year? No Yes No No No  Comment - - - - -  Number falls in past yr: - 1 - - -  Injury with Fall? - No - - -  Risk for fall due to : - - - - -  Some encounter information is confidential and restricted. Go to Review Flowsheets activity to see all data.    HPI  Problem List Items Addressed This Visit      High   Left leg pain - Location: posterior left thigh - Duration: several days - Management: heat and OTC analgesics - Severity: moderate.  Able to continue working outdoors - Associated Symptoms: no numbness, no weakness in leg - Does not recall trauma    Chronic pain syndrome -  Longstanding pain in lumbar region with Left SI joint fusion. - Able to perform ADLs and iADLs with Norco 5/325 QID prn.  Takes Norco 3 to 4 times a day every day - Denies taking more Norco than prescribed - Pt takes Xanax TID for tremor prescribed by Neurology (Dr Jannifer Franklin) - Denies constipation, confusion and falls -Denies use of alcohol nor illicit drugs.       Unprioritized   Encounter for chronic pain management - Review of NCCSRS showed no evidence of doctor shopping.      Other Visit  Diagnoses    Dermatitis  - "bug bites arms and legs.  Outside most of the days.  - pruritis - On and off present for several weeks.  - Previous good response to triamcinolone cream - not spreading.       Relevant Medications   triamcinolone cream (KENALOG) 0.1 %     SH: No smoking  Review of Systems  Musculoskeletal: Positive for back pain.      See HPI Objective:   Physical Exam VS reviewed GEN: Alert, Cooperative, Groomed, NAD HEENT: PERRL; EAC bilaterally not occluded, TM's translucent with normal LM, (+) LR;                No cervical LAN, No thyromegaly, No palpable masses COR: RRR, No M/G/R, No JVD, Normal PMI size and location LUNGS: BCTA, No Acc mm use, speaking in full sentences Gait: Normal speed, No significant path deviation, Step through +,  Psych: Normal affect/thought/speech/language EXT: tender left medial hamstrings, pain wors with resisted knee flexion, no bruising on post thigh. No edema    Assessment & Plan:  Visit Problem List with A/P  No problem-specific Assessment & Plan notes found for this encounter.

## 2018-06-26 NOTE — Assessment & Plan Note (Signed)
Established problem Controlled Review of NCCSRS showed no evidence of doctor shopping.  Benzo prescribed by patient's neurologist. Continue current therapy regiment. 3 monthly Refill Norco 5/325. First fill today.

## 2018-06-26 NOTE — Assessment & Plan Note (Signed)
New problem No further wu Mild injury Conservative management: Relative rest, ICE, OTC NSAIDs, time

## 2018-06-26 NOTE — Assessment & Plan Note (Signed)
New, recurrent problem Recommend DEET when working Tx with Triamcinolone daily itching "bumps"

## 2018-07-05 ENCOUNTER — Other Ambulatory Visit: Payer: Self-pay | Admitting: Adult Health

## 2018-07-08 ENCOUNTER — Other Ambulatory Visit: Payer: Self-pay | Admitting: Family Medicine

## 2018-07-18 ENCOUNTER — Other Ambulatory Visit: Payer: Self-pay | Admitting: Adult Health

## 2018-07-20 NOTE — Telephone Encounter (Signed)
Phenobarbital refilled x 3 months. Rx on Dr Jannifer Franklin desk for signature.

## 2018-08-07 ENCOUNTER — Telehealth: Payer: Self-pay | Admitting: Adult Health

## 2018-08-07 ENCOUNTER — Other Ambulatory Visit: Payer: Self-pay | Admitting: Neurology

## 2018-08-07 MED ORDER — ALPRAZOLAM 1 MG PO TABS: ORAL_TABLET | ORAL | 1 refills | Status: DC

## 2018-08-07 NOTE — Progress Notes (Signed)
A prescription for the alprazolam, 1 mg tablets was sent in.  Patient will take 1/2 tablet 3 times a day if needed.

## 2018-08-07 NOTE — Telephone Encounter (Signed)
I have sent this request to Dr Jannifer Franklin for review for the patient to send to the pharmacy on file. Order was written and changed to reflect the medication change in quantity change as requested by pharmacy since they are out of his normal med strength

## 2018-08-07 NOTE — Telephone Encounter (Signed)
Pt called stating ALPRAZolam (XANAX) 0.5 MG tablet is out of stock needing refills for 1MG (cut in half) sent to [facility]

## 2018-08-20 ENCOUNTER — Ambulatory Visit (INDEPENDENT_AMBULATORY_CARE_PROVIDER_SITE_OTHER): Payer: Medicare Other | Admitting: Family Medicine

## 2018-08-20 ENCOUNTER — Other Ambulatory Visit: Payer: Self-pay

## 2018-08-20 ENCOUNTER — Encounter: Payer: Self-pay | Admitting: Family Medicine

## 2018-08-20 VITALS — BP 126/60 | HR 56 | Temp 97.8°F | Ht 69.0 in | Wt 166.0 lb

## 2018-08-20 DIAGNOSIS — M5440 Lumbago with sciatica, unspecified side: Secondary | ICD-10-CM

## 2018-08-20 DIAGNOSIS — M542 Cervicalgia: Secondary | ICD-10-CM

## 2018-08-20 DIAGNOSIS — G894 Chronic pain syndrome: Secondary | ICD-10-CM

## 2018-08-20 DIAGNOSIS — M79672 Pain in left foot: Secondary | ICD-10-CM

## 2018-08-20 DIAGNOSIS — G8929 Other chronic pain: Secondary | ICD-10-CM

## 2018-08-20 DIAGNOSIS — M533 Sacrococcygeal disorders, not elsewhere classified: Secondary | ICD-10-CM

## 2018-08-20 DIAGNOSIS — G2581 Restless legs syndrome: Secondary | ICD-10-CM

## 2018-08-20 MED ORDER — HYDROCODONE-ACETAMINOPHEN 5-325 MG PO TABS: 1.0000 | ORAL_TABLET | Freq: Four times a day (QID) | ORAL | 0 refills | Status: DC | PRN

## 2018-08-20 NOTE — Patient Instructions (Signed)
Dr Refugio Vandevoorde sent in [place] of your Hydrocodone-acetaminophen tablets to be filled in January, February and March 2020.   We made a referral to [facility] in [place].  They are part of the [place].

## 2018-08-21 DIAGNOSIS — M79672 Pain in left foot: Secondary | ICD-10-CM | POA: Insufficient documentation

## 2018-08-21 HISTORY — DX: Pain in left foot: M79.672

## 2018-08-21 NOTE — Assessment & Plan Note (Signed)
Established problem Controlled Review of NCCSRS showed no evidence of doctor shopping.  Benzo prescribed by patient's neurologist. Continue current therapy regiment. 3 monthly Refill Norco 5/325 to fill 09/20/18; 10/21/18; and 11/20/18 RTC 3 months

## 2018-08-21 NOTE — Progress Notes (Signed)
Subjective:    Patient ID: Richard Glass, male    DOB: 04-Oct-1970, 47 y.o.   MRN: [id number] Richard Glass is accompanied by mother Sources of clinical information for visit is/are patient and past medical records. Nursing assessment for this office visit was reviewed with the patient for accuracy and revision.  Previous Report(s) Reviewed: historical medical records  Depression screen [facility] 2/9 08/20/2018  Decreased Interest 0  Down, Depressed, Hopeless 0  PHQ - 2 Score 0  Altered sleeping -  Tired, decreased energy -  Change in appetite -  Feeling bad or failure about yourself  -  Trouble concentrating -  Moving slowly or fidgety/restless -  Suicidal thoughts -  PHQ-9 Score -  Some recent data might be hidden   Fall Risk  07/08/2017 05/01/2017 02/28/2017 02/13/2017 01/02/2017  Falls in the past year? No Yes No No No  Comment - - - - -  Number falls in past yr: - 1 - - -  Injury with Fall? - No - - -  Risk for fall due to : - - - - -  Some encounter information is confidential and restricted. Go to Review Flowsheets activity to see all data.    Back Pain     Problem List Items Addressed This Visit      High   Left foot pain - Location:left lateral foot, "over bump on side of my foot" - Duration: years - Course: intermittent - Management: OTC analgesics - Severity: moderate.  Able to continue working outdoors - Associated Symptoms: no numbness - Does not recall trauma to foot    Chronic pain syndrome -  Longstanding pain in lumbar region with Left SI joint fusion. - Able to perform ADLs and iADLs with Norco 5/325 QID prn.  Takes Norco 3 to 4 times a day every day - Denies taking more Norco than prescribed - Pt takes Xanax TID for tremor prescribed by Neurology (Dr Jannifer Franklin) - Denies constipation, confusion and falls -Denies use of alcohol nor illicit drugs.       Unprioritized   Encounter for chronic pain management - Review of NCCSRS showed no evidence of doctor  shopping.         SH: No smoking  Review of Systems  Musculoskeletal: Positive for back pain.      See HPI Objective:   Physical Exam VS reviewed GEN: Alert, Cooperative, Groomed, NAD HEENT: PERRL; EAC bilaterally not occluded, TM's translucent with normal LM, (+) LR;                No cervical LAN, No thyromegaly, No palpable masses COR: RRR, No M/G/R, No JVD, Normal PMI size and location LUNGS: BCTA, No Acc mm use, speaking in full sentences Gait: Normal speed, No significant path deviation, Step through +,  Psych: Normal affect/thought/speech/language EXT:   left 5th MT without erythema or callous.  Tender to palpation Assessment & Plan:  Visit Problem List with A/P  Chronic pain syndrome Established problem Controlled Review of NCCSRS showed no evidence of doctor shopping.  Benzo prescribed by patient's neurologist. Continue current therapy regiment. 3 monthly Refill Norco 5/325  to fill 09/20/18; 10/21/18; and 11/20/18 RTC 3 months  Back pain of lumbar region with sciatica Established problem Controlled Review of NCCSRS showed no evidence of doctor shopping.  Benzo prescribed by patient's neurologist. Continue current therapy regiment. 3 monthly Refill Norco 5/325 to fill 09/20/18; 10/21/18; and 11/20/18 RTC 3 months

## 2018-08-21 NOTE — Assessment & Plan Note (Signed)
New complaint Pain overlying 5th MT styloid of left foot No trauma history.  Suspect styloid rubbing against shoes.  Referral to [facility] for evaluation and treatment

## 2018-08-25 ENCOUNTER — Ambulatory Visit: Payer: Medicare Other

## 2018-08-25 ENCOUNTER — Encounter: Payer: Self-pay | Admitting: Sports Medicine

## 2018-08-25 ENCOUNTER — Ambulatory Visit: Payer: Medicare Other | Admitting: Sports Medicine

## 2018-08-25 VITALS — BP 110/66 | HR 61 | Temp 97.6°F | Resp 16

## 2018-08-25 DIAGNOSIS — M79672 Pain in left foot: Secondary | ICD-10-CM

## 2018-08-25 DIAGNOSIS — M779 Enthesopathy, unspecified: Secondary | ICD-10-CM

## 2018-08-25 DIAGNOSIS — M7752 Other enthesopathy of left foot: Secondary | ICD-10-CM

## 2018-08-25 DIAGNOSIS — G629 Polyneuropathy, unspecified: Secondary | ICD-10-CM | POA: Diagnosis not present

## 2018-08-25 MED ORDER — TRIAMCINOLONE ACETONIDE 10 MG/ML IJ SUSP
10.0000 mg | Freq: Once | INTRAMUSCULAR | Status: AC
  Administered 2018-08-25: 10 mg

## 2018-08-25 NOTE — Progress Notes (Signed)
Subjective: Richard Glass is a 47 y.o. male patient who presents to office for evaluation of L>R foot pain as a burning sensation and pain to lateral side of his left foot. Patient complains of progressive pain especially over the last 3 months especially on the lateral side of left foot and burning x 5 months. Reports a history of back issues and on pain meds and history of fusion and knee buckling on left; like today in office that required a wheelchair. Patient denies any other pedal complaints. Denies injury/trip/fall/sprain/any causative factors.   Review of Systems  Musculoskeletal: Positive for back pain, joint pain and myalgias.  Neurological: Positive for tremors and sensory change.  All other systems reviewed and are negative.    Patient Active Problem List   Diagnosis Date Noted  . Left foot pain 08/21/2018  . Hamstring muscle strain, left, initial encounter 06/26/2018  . History of traumatic brain injury 02/23/2018  . Contact dermatitis or eczema 02/13/2018  . High risk medication use, Combination of Opioid and Benzodiazapine 02/12/2018  . 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 SI (sacroiliac) pain 03/20/2017  . Osteopenia 10/10/2016  . Enchondroma of left fibular head 05/17/2016  . Chronic pain syndrome 01/18/2016  . Insect bites 01/18/2016  . Cervical spine pain 02/06/2015  . Anxiety state 08/08/2013  . Insomnia 02/02/2013  . Right flank pain 05/21/2011  . ASTHMA, INTERMITTENT 07/08/2010  . Intention tremor 10/31/2009  . Encounter for chronic pain management 07/04/2009  . Overweight(278.02) 06/13/2009  . Restless leg 09/29/2007  . Hyperlipidemia 11/13/2006  . Allergic rhinitis 11/13/2006  . GASTROESOPHAGEAL REFLUX, NO ESOPHAGITIS 11/13/2006  . Back pain of lumbar region with sciatica 11/13/2006  . Convulsions ([place]) 11/13/2006    Current Outpatient Medications on File Prior to Visit  Medication Sig Dispense Refill  .  ALPRAZolam (XANAX) 0.5 MG tablet TAKE 1 TABLET BY MOUTH THREE TIMES A DAY AS NEEDED 90 tablet 1  . ALPRAZolam (XANAX) 1 MG tablet Take half tablet Three times daily as needed. 45 tablet 1  . cetirizine (ZYRTEC) 10 MG tablet Take 1 tablet (10 mg total) by mouth daily. (Patient taking differently: Take 10 mg by mouth daily as needed. ) 90 tablet prn  . [facility] CALCIUM 600+D 600-800 MG-UNIT TABS TAKE 1 TABLET BY MOUTH 2 (TWO) TIMES DAILY BEFORE A MEAL. 180 tablet 3  . fluticasone (FLONASE) 50 MCG/ACT nasal spray PLACE 2 SPRAYS DAILY INTO BOTH NOSTRILS 48 g 3  . gabapentin (NEURONTIN) 300 MG capsule Take 3 capsules (900 mg total) by mouth 3 (three) times daily. 810 capsule 3  . [START ON 10/21/2018] HYDROcodone-acetaminophen (NORCO) 5-325 MG tablet Take 1 tablet by mouth every 6 (six) hours as needed for moderate pain. 100 tablet 0  . [START ON 09/21/2018] HYDROcodone-acetaminophen (NORCO) 5-325 MG tablet Take 1 tablet by mouth every 6 (six) hours as needed for moderate pain. 100 tablet 0  . [START ON 11/20/2018] HYDROcodone-acetaminophen (NORCO/VICODIN) 5-325 MG tablet Take 1 tablet by mouth every 6 (six) hours as needed. for pain 100 tablet 0  . PHENobarbital (LUMINAL) 64.8 MG tablet TAKE 3 TABLETS BY MOUTH AT BEDTIME 270 tablet 0  . topiramate (TOPAMAX) 50 MG tablet Take one tablet in the morning and two tablets in the evening 270 tablet 3  . triamcinolone cream (KENALOG) 0.1 % Apply 1 application topically 2 (two) times daily. To areas of itching on skin. 45 g 3   No current facility-administered medications  on file prior to visit.     Allergies  Allergen Reactions  . No Known Allergies     Objective:  General: Alert and oriented x3 in no acute distress  Dermatology: No open lesions bilateral lower extremities, no webspace macerations, no ecchymosis bilateral, all nails x 10 are well manicured.  Vascular: Dorsalis Pedis and Posterior Tibial pedal pulses palpable, Capillary Fill Time 3 seconds,(+)  pedal hair growth bilateral, no edema bilateral lower extremities, Temperature gradient within normal limits.  Neurology: Gross sensation intact via light touch bilateral but protective sensation diminished bilateral.   Musculoskeletal: Mild tenderness with palpation at styloid process on left foot,No pain with calf compression bilateral. There is general weakness and tremor history of seziures.  Gait: Antalgic gait, patient had to be placed in wheelchair during this visit   Assessment and Plan: Problem List Items Addressed This Visit      Other   Left foot pain    Other Visit Diagnoses    Capsulitis    -  Primary   Neuropathy           -Complete examination performed -Xrays unable to be obtained due to instability to knee on left   -Discussed treatement options -After oral consent and aseptic prep, injected a mixture containing 1 ml of 2%  plain lidocaine, 1 ml 0.5% plain marcaine, 0.5 ml of kenalog 10 and 0.5 ml of dexamethasone phosphate into Left 5th met base without complication. Post-injection care discussed with patient.  -Recommend good supportive shoes and offloading padding to lateral foot  -Recommend patient to discuss with neurologist possible medication adjustments for neuropathy/burning sensations  -Patient to return to office as needed or sooner if condition worsens.  Titorya Stover, DPM   

## 2018-09-03 ENCOUNTER — Ambulatory Visit: Payer: Medicare Other | Admitting: Adult Health

## 2018-09-03 ENCOUNTER — Encounter: Payer: Self-pay | Admitting: Adult Health

## 2018-09-03 VITALS — BP 115/69 | HR 59 | Ht 69.0 in | Wt 163.8 lb

## 2018-09-03 DIAGNOSIS — R569 Unspecified convulsions: Secondary | ICD-10-CM

## 2018-09-03 DIAGNOSIS — Z5181 Encounter for therapeutic drug level monitoring: Secondary | ICD-10-CM

## 2018-09-03 NOTE — Patient Instructions (Signed)
Your Plan:  Continue Topamax and Phenobarbital  If your symptoms worsen or you develop new symptoms please let us know.   Thank you for coming to see [place] at [facility]. I hope we have been able to provide you high quality care today.  You may receive a patient satisfaction survey over the next few weeks. We would appreciate your feedback and comments so that we may continue to improve ourselves and the health of our patients.

## 2018-09-03 NOTE — Progress Notes (Signed)
PATIENT: Richard Glass DOB: [birth year]  REASON FOR VISIT: follow up HISTORY FROM: patient  HISTORY OF PRESENT ILLNESS: Today 09/03/18:  Richard Glass is a 47 year old male with a history of seizures.  He returns today for follow-up.  He denies any seizure events.  Reports that he continues on phenobarbital and Topamax.  He operates a Teacher, music.  He is able to complete all ADLs independently.  No change in his gait or balance.  He reports that he has been having left foot pain.  He did see podiatry who did an injection.  He also states that his podiatrist was curious that gabapentin can be increased.  This has been prescribed by his PCP.  He returns today for an evaluation.  HISTORY 04/27/18:  Richard Glass is a 47 year old male with a history of seizures.  He returns today for follow-up.  He states that he has had any true seizure events.  He reports that he has had a couple of episodes where he felt as if he may have a seizure.  He states that he became overheated and lightheaded and his tremor in the hands got worse.  He continues to live at home.  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  He operates a Teacher, music without difficulty.  He is able to complete all ADLs.  He reports a tremor in his hands have improved.  He continues on phenobarbital and Topamax.  He continues to take the department twice a day as needed.  REVIEW OF SYSTEMS: Out of a complete 14 system review of symptoms, the patient complains only of the following symptoms, and all other reviewed systems are negative.  See HPI  ALLERGIES: Allergies  Allergen Reactions  . No Known Allergies     HOME MEDICATIONS: Outpatient Medications Prior to Visit  Medication Sig Dispense Refill  . ALPRAZolam (XANAX) 1 MG tablet Take half tablet Three times daily as needed. 45 tablet 1  . cetirizine (ZYRTEC) 10 MG tablet Take 1 tablet (10 mg total) by mouth daily. (Patient taking differently: Take 10 mg by mouth daily as needed. ) 90  tablet prn  . [facility] CALCIUM 600+D 600-800 MG-UNIT TABS TAKE 1 TABLET BY MOUTH 2 (TWO) TIMES DAILY BEFORE A MEAL. 180 tablet 3  . fluticasone (FLONASE) 50 MCG/ACT nasal spray PLACE 2 SPRAYS DAILY INTO BOTH NOSTRILS 48 g 3  . gabapentin (NEURONTIN) 300 MG capsule Take 3 capsules (900 mg total) by mouth 3 (three) times daily. 810 capsule 3  . HYDROcodone-acetaminophen (NORCO/VICODIN) 5-325 MG tablet Take 1 tablet by mouth every 6 (six) hours as needed for moderate pain.    Marland Kitchen PHENobarbital (LUMINAL) 64.8 MG tablet TAKE 3 TABLETS BY MOUTH AT BEDTIME 270 tablet 0  . topiramate (TOPAMAX) 50 MG tablet Take one tablet in the morning and two tablets in the evening 270 tablet 3  . triamcinolone cream (KENALOG) 0.1 % Apply 1 application topically 2 (two) times daily. To areas of itching on skin. 45 g 3  . [START ON 10/21/2018] HYDROcodone-acetaminophen (NORCO) 5-325 MG tablet Take 1 tablet by mouth every 6 (six) hours as needed for moderate pain. (Patient not taking: Reported on 09/03/2018) 100 tablet 0  . [START ON 09/21/2018] HYDROcodone-acetaminophen (NORCO) 5-325 MG tablet Take 1 tablet by mouth every 6 (six) hours as needed for moderate pain. (Patient not taking: Reported on 09/03/2018) 100 tablet 0  . [START ON 11/20/2018] HYDROcodone-acetaminophen (NORCO/VICODIN) 5-325 MG tablet Take 1 tablet by mouth every 6 (six)  hours as needed. for pain (Patient not taking: Reported on 09/03/2018) 100 tablet 0  . ALPRAZolam (XANAX) 0.5 MG tablet TAKE 1 TABLET BY MOUTH THREE TIMES A DAY AS NEEDED 90 tablet 1   No facility-administered medications prior to visit.     PAST MEDICAL HISTORY: Past Medical History:  Diagnosis Date  . 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  . Allergic rhinitis 11/13/2006       . Anxiety   . Anxiety state 08/08/2013  . Asthma   . ASTHMA, INTERMITTENT 07/08/2010   Qualifier: Diagnosis of  By: Walker Kehr MD, Patrick Jupiter    . Back pain  of lumbar region with sciatica 11/13/2006   MRI 12/2014 - lumbar DDD without focal neural impingement  MRI Lumbar 10/30/16 ([place]) Small central L5-S1 disc extrusion with minimal cranial migration and associated annular fissure approaches descending left S1 nerve roots without contact or displacement. Minimal bulging disc height loss without spinal canal stenosis or neural foraminal narrowing.  Minimal bulging disc at L2-L3 through L4-L5 without spinal canal stenosis or neural foraminal narrowing.  MRI Sacrum 10/30/16 ([place]) No fracture. Small lesion left sacral ala most likely a subchondral cyst/erosion adjacent to the sacroiliac joint.     . 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 Carpal tunnel syndrome 01/09/2015   Left 12/2014   . Cervical spine pain 02/06/2015   MRI 03/2015 FINDINGS: Vertebral body height, signal and alignment are normal. The craniocervical junction is normal and cervical cord signal is normal. The central spinal canal and neural foramina are widely patent at all levels. Scattered, mild degenerative change appears most notable at C4-5. Imaged paraspinous structures are unremarkable.  IMPRESSION: Negative for central canal or foraminal narrowing. No finding to explain the patient's symptoms. Scattered, mild facet degenerative disease is noted.   . Chronic low back pain   . Chronic pain 01/18/2016  . Chronic pain syndrome 01/18/2016  . Coccyx pain 02/06/2015  . 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   . Degenerative arthritis   . Dyslipidemia   . External hemorrhoid, bleeding 06/04/2011  . GASTROESOPHAGEAL REFLUX, NO ESOPHAGITIS 11/13/2006   Qualifier: Diagnosis of  By: Eusebio Friendly    . Headache(784.0)   . Hyperlipidemia 11/13/2006   07/2016  ASCVD score of 4.7% - recommended lifestyle changes   . Insomnia 02/02/2013  . Intention tremor 10/31/2009    Qualifier: Diagnosis of  By: Walker Kehr MD, Patrick Jupiter    . Intercostal pain 04/25/2015  . Left knee injury 05/09/2016  . 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 Morton's neuroma of left foot 01/18/2016  . Osteopenia 10/10/2016  . Pain in joint, shoulder region 07/05/2014   LEFT > Right Reports hx rotator cuff surgery on rigt shoulder previously (Dr Nicholes Stairs) but no notes available XRAY right shoulder showed some proliferative changes distal rigt clavicle   . Rash and nonspecific skin eruption 10/10/2016  . Restless leg 09/29/2007   Qualifier: Diagnosis of  By: Walker Kehr MD, Patrick Jupiter    . Restless legs syndrome (RLS)   . Sciatica of left side 10/26/2014  . Seizures ([place])    last >61yrs  . SI (sacroiliac) pain 03/20/2017  . Sinusitis chronic, frontal   . Status post lumbar spinal fusion 03/20/2017  . Subacromial or subdeltoid bursitis 08/08/2009   MRI C-spine 2011([facility]) - normal MRI left shoulder 10/2010 (  [facility]) - AC degenerative disease, normal rotator cuff 12/2012 -debridement, acromioplasty and distal clavicle excision of left shoulder, arthroscopy also performed an no need for rotator cuff repair - performed by [facility] 02/2013 - Nerve Conduction Studies ([facility]) - ulnar nerve compression 02/2013- taken back to OR for Ulnar nerve decompression 06/2013 Repeat MRI left shoulder - subacromial/subdeltoid bursitis, a small intersubstance tear to the distal infraspinatus and evidence of interval resection of the distal clavicle 06/2013 - steroid injection of left biceps 09/2013 -Repeat EMG showed improvement of ulnar nerve compression 10/2013 - referred to [facility] for second opinion due to intractable pain 11/2013 - Seen by Dr. Marlou Sa at [facility]; discussed risks/benefits of surgical intervention and bursectomy, no plan for surgery at this time     . 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 [place]   struck by golf ball-47 yrs old  . CRANIOTOMY     metal plate  . NASAL FRACTURE SURGERY  1997  . SACROILIAC JOINT FUSION Left 03/20/2017   Procedure: SACROILIAC JOINT FUSION;  Surgeon: Melina Schools, MD;  Location: [place];  Service: Orthopedics;  Laterality: Left;  90 mins  . SHOULDER ARTHROSCOPY Left    "cleaned arthritis out"  . SHOULDER SURGERY Right 2012   Rotator cuff surgery  . TONSILLECTOMY    . UVULOPALATOPHARYNGOPLASTY (UPPP)/TONSILLECTOMY/SEPTOPLASTY  2001    FAMILY HISTORY: Family History  Problem Relation Age of Onset  . Cancer Father        lung  . Heart disease Father   . Hypertension Mother   . Seizures Neg Hx     SOCIAL HISTORY: Social History   Socioeconomic History  . Marital status: Single    Spouse name: Not on file  . Number of children: 0  . Years of education: 78  . Highest education level: Not on file  Occupational History  . Occupation: disabled  Social Needs  . Financial resource strain: Not on file  . Food insecurity:    Worry: Not on file    Inability: Not on file  . Transportation needs:    Medical: Not on file    Non-medical: Not on file  Tobacco Use  . Smoking status: Former Smoker    Packs/day: 1.00    Years: 22.00    Pack years: 22.00    Types: Cigarettes    Last attempt to quit: 06/22/2012    Years since quitting: 6.2  . Smokeless tobacco: Current User    Types: Snuff  Substance and Sexual Activity  . Alcohol use: No    Alcohol/week: 0.0 standard drinks    Comment: none since 7/11  . Drug use: No  . Sexual activity: Never  Lifestyle  . Physical activity:    Days per week: Not on file    Minutes per session: Not on file  . Stress: Not on file  Relationships  . Social connections:    Talks on phone: Not on file    Gets together: Not on file    Attends religious service: Not on file    Active member of club or  organization: Not on file    Attends meetings of clubs or organizations: Not on file    Relationship status: Not on file  . Intimate partner violence:    Fear of current  or ex partner: Not on file    Emotionally abused: Not on file    Physically abused: Not on file    Forced sexual activity: Not on file  Other Topics Concern  . 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  Generalized: Well developed, in no acute distress   Neurological examination  Mentation: Alert oriented to time, place, history taking. Follows all commands speech and language fluent Cranial nerve II-XII: Pupils were equal round reactive to light. Extraocular movements were full, visual field were full on confrontational test. Facial sensation and strength were normal. Uvula tongue midline. Head turning and shoulder shrug  were normal and symmetric. Motor: The motor testing reveals 5 over 5 strength of all 4 extremities. Good symmetric motor tone is noted throughout.  Sensory: Sensory testing is intact to soft touch on all 4 extremities. No evidence of extinction is noted.  Coordination: Cerebellar testing reveals good finger-nose-finger and heel-to-shin bilaterally.  Gait and station: Gait is normal. Tandem gait is normal. Romberg is negative. No drift is seen.  Reflexes: Deep tendon reflexes are symmetric and normal bilaterally.   DIAGNOSTIC DATA (LABS, IMAGING, TESTING) - I reviewed patient records, labs, notes, testing and imaging myself where available.  Lab Results  Component Value Date   WBC 5.6 04/27/2018   HGB 13.9 04/27/2018   HCT 42.3 04/27/2018   MCV 94 04/27/2018    PLT 256 04/27/2018      Component Value Date/Time   NA 141 04/27/2018 1125   K 4.5 04/27/2018 1125   CL 103 04/27/2018 1125   CO2 25 04/27/2018 1125   GLUCOSE 76 04/27/2018 1125   GLUCOSE 86 05/05/2017 1124   BUN 6 04/27/2018 1125   CREATININE 0.86 04/27/2018 1125   CREATININE 0.73 07/26/2016 0911   CALCIUM 9.6 04/27/2018 1125   PROT 6.6 04/27/2018 1125   ALBUMIN 4.6 04/27/2018 1125   AST 10 04/27/2018 1125   ALT 11 04/27/2018 1125   ALKPHOS 68 04/27/2018 1125   BILITOT <0.2 04/27/2018 1125   GFRNONAA 102 04/27/2018 1125   GFRNONAA >89 07/26/2016 0911   GFRAA 118 04/27/2018 1125   GFRAA >89 07/26/2016 0911   Lab Results  Component Value Date   CHOL 238 (H) 07/26/2016   HDL 41 07/26/2016   LDLCALC 160 (H) 07/26/2016   LDLDIRECT 90 06/18/2011   TRIG 187 (H) 07/26/2016   CHOLHDL 5.8 (H) 07/26/2016   Lab Results  Component Value Date   HGBA1C 5.1 12/17/2011    Lab Results  Component Value Date   TSH 1.353 07/06/2013      ASSESSMENT AND PLAN 47 y.o. year old male  has a past medical history of Abnormal head CT (05/2011), Allergic rhinitis (11/13/2006), Anxiety, Anxiety state (08/08/2013), Asthma, ASTHMA, INTERMITTENT (07/08/2010), Back pain of lumbar region with sciatica (11/13/2006), Bone tumor (05/17/2016), Carpal tunnel syndrome (01/09/2015), Cervical spine pain (02/06/2015), Chronic low back pain, Chronic pain (01/18/2016), Chronic pain syndrome (01/18/2016), Coccyx pain (02/06/2015), Convulsions ([place]) (11/13/2006), Degenerative arthritis, Dyslipidemia, External hemorrhoid, bleeding (06/04/2011), GASTROESOPHAGEAL REFLUX, NO  ESOPHAGITIS (11/13/2006), Headache(784.0), Hyperlipidemia (11/13/2006), Insomnia (02/02/2013), Intention tremor (10/31/2009), Intercostal pain (04/25/2015), Left knee injury (05/09/2016), 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 here with :  1.  Seizures   Overall the patient has done well.  He will continue on phenobarbital and Topamax.  I will check blood work today.  I have advised the patient that if his symptoms worsen or he develops new symptoms he should let us know.  He will follow-up in 6 months or sooner if needed.   I spent 15 minutes with the patient. 50% of this time was spent reviewing his plan of care   Ward Givens, MSN, NP-C 09/03/2018, 7:54 AM [facility] [address], [place], [place] [address] [phone]

## 2018-09-03 NOTE — Progress Notes (Signed)
I have read the note, and I agree with the clinical assessment and plan.  Charles K Willis   

## 2018-09-04 ENCOUNTER — Telehealth: Payer: Self-pay | Admitting: *Deleted

## 2018-09-04 LAB — CBC WITH DIFFERENTIAL/PLATELET
Basophils Absolute: 0 10*3/uL (ref 0.0–0.2)
Basos: 0 %
EOS (ABSOLUTE): 0.1 10*3/uL (ref 0.0–0.4)
Eos: 1 %
Hematocrit: 41.4 % (ref 37.5–51.0)
Hemoglobin: 13.2 g/dL (ref 13.0–17.7)
Immature Grans (Abs): 0 10*3/uL (ref 0.0–0.1)
Immature Granulocytes: 0 %
Lymphocytes Absolute: 2 10*3/uL (ref 0.7–3.1)
Lymphs: 30 %
MCH: 30.4 pg (ref 26.6–33.0)
MCHC: 31.9 g/dL (ref 31.5–35.7)
MCV: 95 fL (ref 79–97)
Monocytes Absolute: 0.5 10*3/uL (ref 0.1–0.9)
Monocytes: 8 %
Neutrophils Absolute: 4 10*3/uL (ref 1.4–7.0)
Neutrophils: 61 %
Platelets: 265 10*3/uL (ref 150–450)
RBC: 4.34 x10E6/uL (ref 4.14–5.80)
RDW: 13 % (ref 12.3–15.4)
WBC: 6.6 10*3/uL (ref 3.4–10.8)

## 2018-09-04 LAB — PHENOBARBITAL LEVEL: Phenobarbital, Serum: 32 ug/mL (ref 15–40)

## 2018-09-04 LAB — COMPREHENSIVE METABOLIC PANEL
ALT: 9 IU/L (ref 0–44)
AST: 10 IU/L (ref 0–40)
Albumin/Globulin Ratio: 2 (ref 1.2–2.2)
Albumin: 4.4 g/dL (ref 3.5–5.5)
Alkaline Phosphatase: 78 IU/L (ref 39–117)
BUN/Creatinine Ratio: 8 — ABNORMAL LOW (ref 9–20)
BUN: 7 mg/dL (ref 6–24)
Bilirubin Total: <0.2 mg/dL (ref 0.0–1.2)
CO2: 25 mmol/L (ref 20–29)
Calcium: 9.6 mg/dL (ref 8.7–10.2)
Chloride: 102 mmol/L (ref 96–106)
Creatinine, Ser: 0.86 mg/dL (ref 0.76–1.27)
GFR calc Af Amer: 118 mL/min/{1.73_m2} (ref >59)
GFR calc non Af Amer: 102 mL/min/{1.73_m2} (ref >59)
Globulin, Total: 2.2 g/dL (ref 1.5–4.5)
Glucose: 69 mg/dL (ref 65–99)
Potassium: 4.3 mmol/L (ref 3.5–5.2)
Sodium: 138 mmol/L (ref 134–144)
Total Protein: 6.6 g/dL (ref 6.0–8.5)

## 2018-09-04 NOTE — Telephone Encounter (Signed)
-----   Message from Ward Givens, NP sent at 09/04/2018  9:53 AM EST ----- Labs results are unremarkable. Please call patient with results.

## 2018-09-04 NOTE — Telephone Encounter (Signed)
I spoke with pt. and advised lab work done in our office is ok. No concerns noted. He verbalized understanding of same/fim

## 2018-09-24 DIAGNOSIS — M545 Low back pain: Secondary | ICD-10-CM | POA: Diagnosis not present

## 2018-09-25 DIAGNOSIS — M545 Low back pain: Secondary | ICD-10-CM | POA: Diagnosis not present

## 2018-09-29 DIAGNOSIS — M545 Low back pain: Secondary | ICD-10-CM | POA: Diagnosis not present

## 2018-10-01 ENCOUNTER — Ambulatory Visit (INDEPENDENT_AMBULATORY_CARE_PROVIDER_SITE_OTHER): Payer: Medicare Other | Admitting: Family Medicine

## 2018-10-01 ENCOUNTER — Encounter: Payer: Self-pay | Admitting: Family Medicine

## 2018-10-01 ENCOUNTER — Other Ambulatory Visit: Payer: Self-pay

## 2018-10-01 VITALS — BP 112/64 | Temp 97.9°F | Ht 69.0 in | Wt 165.0 lb

## 2018-10-01 DIAGNOSIS — M5440 Lumbago with sciatica, unspecified side: Secondary | ICD-10-CM | POA: Diagnosis not present

## 2018-10-01 MED ORDER — BACLOFEN 10 MG PO TABS: 5.0000 mg | ORAL_TABLET | Freq: Three times a day (TID) | ORAL | 0 refills | Status: DC

## 2018-10-01 MED ORDER — GABAPENTIN 600 MG PO TABS: 1200.0000 mg | ORAL_TABLET | Freq: Three times a day (TID) | ORAL | 3 refills | Status: DC

## 2018-10-01 NOTE — Patient Instructions (Signed)
We will increase your gabapentin dose to 1200 mg three times a day.  For now, take four of your 300 mg gabapentin capsules three times a day.  When you pick up you refill the gabapentin, take two of the 600 mg gabapentin capsules three times a day.  Start a medicine called Baclofen for the muscle spasms in your left leg.  You can use Baclofen 5 mg tablet, one tablet up to three times a day, if you need it for your leg spasms.   Dr [name] has requested records from Dr Jerene Pitch to see what he found on the MRI and to seewho he has referred you to.

## 2018-10-02 ENCOUNTER — Encounter: Payer: Self-pay | Admitting: Family Medicine

## 2018-10-02 NOTE — Assessment & Plan Note (Signed)
Established problem worsened.  ROI Dr Rolena Infante last week office visit and spine MRI report Increase Gabapentin max 1200 mg TID Start Lioresal (Baclofen) 5 mg TID prn spasms leg  Other analgesic options could include NSAIDs (eGRF 12/19 = >100 mL/min) Inrease topiramate Add TCA/SNRI Capsacin Diclofenac topical

## 2018-10-02 NOTE — Progress Notes (Signed)
Acute Office Visit  Subjective:    Patient ID: Richard Glass, male    DOB: [birth year], 48 y.o.   MRN: [id number]  Chief Complaint  Patient presents with  . Leg Pain    HPI Patient is in today for left leg pain Onset: several years ago, recent worsening in last couple months Location: left leg, esp in posterior left thigh Quality: burning, cramping, especially with extension of left knee Severity: rated 10/10  Pattern: intermittent, precipitated Course: recent worsening Radiation: buttock to posterior left knee Relief: hydrocodoje/apap, gabapentin Precipitant: extension of left knee or full weight bearing left leg Associated Symptoms:       Restricted ROM/stiffness/swelling:  Avoids full extension of left knee       Muscle ache/cramp/spasms: posterior thigh cramps       Muscle strength change: no       Change in sensation (dysesthesia/itch or numbness): no tingling, (+) burning Functional Impact:       Change in social or recreation: none                ADLs: independent.  (+) driving. He fell off commode last week.  No LOC.  No head trauma.  Fell when leaning forward to get off commode.         Change in mood/energy level: no change       Change in relationships: no change       Change in work activities: Working outside in yard though slower and with more consciousness of movements               Last time worked: this week       Change in sleep from pain: no   Trauma (Acute or Chronic): No recent acute injuries to back or leg    Prior Diagnostic Testing or Treatments:  MRI Lumbar 10/30/16 ([facility]) Small central L5-S1 disc extrusion with minimal cranial migration and associated annular fissure approaches descending left S1 nerve roots without contact or displacement. Minimal bulging disc height loss without spinal canal stenosis or neural foraminal narrowing.  Minimal bulging disc at L2-L3 through L4-L5 without spinal canal stenosis or neural foraminal  narrowing.  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  Pt recalls MRI of back obtained last week.  He is uncertain of its findings.  Dr Rolena Infante may have referred patient to another physician "about my leg."  Pt may have been referred to Physical Therapy as well.   Dr Rolena Infante prescribed a rolling walker and corseted lumbar brace  Dr Rolena Infante may have prescribed a Medrol dose pak disp 09/24/18.  Which pt has been wearing since last week.  Pt reports that Dr Rolena Infante told pt to see his PCP for increase dose of his gabapentin.   Patient seen by Dr Wallie Char, DPM 08/25/18, for left lateral foot pain.  He was treated for capsulitis with steroid injection.      Past Medical History:  Diagnosis Date  . 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  . Allergic rhinitis 11/13/2006       . Anxiety   . Anxiety state 08/08/2013  . Asthma   . ASTHMA, INTERMITTENT 07/08/2010   Qualifier: Diagnosis of  By: Walker Kehr MD, Patrick Jupiter    . Back pain of lumbar region with sciatica  11/13/2006   MRI 12/2014 - lumbar DDD without focal neural impingement  MRI Lumbar 10/30/16 ([place]) Small central L5-S1 disc extrusion with minimal cranial migration and associated annular fissure approaches descending left S1 nerve roots without contact or displacement. Minimal bulging disc height loss without spinal canal stenosis or neural foraminal narrowing.  Minimal bulging disc at L2-L3 through L4-L5 without spinal canal stenosis or neural foraminal narrowing.  MRI Sacrum 10/30/16 ([place]) No fracture. Small lesion left sacral ala most likely a subchondral cyst/erosion adjacent to the sacroiliac joint.     . 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 Carpal tunnel syndrome 01/09/2015   Left 12/2014   .  Cervical spine pain 02/06/2015   MRI 03/2015 FINDINGS: Vertebral body height, signal and alignment are normal. The craniocervical junction is normal and cervical cord signal is normal. The central spinal canal and neural foramina are widely patent at all levels. Scattered, mild degenerative change appears most notable at C4-5. Imaged paraspinous structures are unremarkable.  IMPRESSION: Negative for central canal or foraminal narrowing. No finding to explain the patient's symptoms. Scattered, mild facet degenerative disease is noted.   . Chronic low back pain   . Chronic pain 01/18/2016  . Chronic pain syndrome 01/18/2016  . Coccyx pain 02/06/2015  . 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   . Degenerative arthritis   . Dyslipidemia   . External hemorrhoid, bleeding 06/04/2011  . GASTROESOPHAGEAL REFLUX, NO ESOPHAGITIS 11/13/2006   Qualifier: Diagnosis of  By: Eusebio Friendly    . Headache(784.0)   . Hyperlipidemia 11/13/2006   07/2016  ASCVD score of 4.7% - recommended lifestyle changes   . Insomnia 02/02/2013  . Intention tremor 10/31/2009   Qualifier: Diagnosis of  By: Walker Kehr MD, Patrick Jupiter    . Intercostal pain 04/25/2015  . Left knee injury 05/09/2016  . 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 Morton's neuroma of left foot 01/18/2016  . Osteopenia 10/10/2016  . Pain in joint, shoulder region 07/05/2014   LEFT > Right Reports hx rotator cuff surgery on rigt shoulder previously (Dr Nicholes Stairs) but no notes available XRAY right shoulder showed some proliferative changes distal rigt clavicle   . Rash and nonspecific skin eruption 10/10/2016  . Restless leg 09/29/2007   Qualifier: Diagnosis of  By: Walker Kehr MD, Patrick Jupiter    . Restless legs syndrome (RLS)   . Sciatica of left side 10/26/2014  . Seizures ([place])    last >38yr  . SI (sacroiliac) pain 03/20/2017  . Sinusitis chronic, frontal   . Status post lumbar spinal fusion 03/20/2017  . Subacromial or  subdeltoid bursitis 08/08/2009   MRI C-spine 2011([facility]) - normal MRI left shoulder 10/2010 ([facility]) - AC degenerative disease, normal rotator cuff 12/2012 -debridement, acromioplasty and distal clavicle excision of left shoulder, arthroscopy also performed an no need for rotator cuff repair - performed by [facility] 02/2013 - Nerve Conduction Studies ([facility]) - ulnar nerve compression 02/2013- taken back to OR for Ulnar nerve decompression 06/2013 Repeat MRI left shoulder - subacromial/subdeltoid bursitis, a small intersubstance tear to the distal infraspinatus and evidence of interval resection of the distal clavicle 06/2013 - steroid injection of left biceps 09/2013 -Repeat EMG showed improvement of ulnar nerve compression 10/2013 [facility] to [facility] - [facility] risks/benefits of surgical intervention and bursectomy, no  plan for surgery at this time     . 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 [place]   struck by golf ball-48 yrs old  . CRANIOTOMY     metal plate  . NASAL FRACTURE SURGERY  1997  . SACROILIAC JOINT FUSION Left 03/20/2017   Procedure: SACROILIAC JOINT FUSION;  Surgeon: Melina Schools, MD;  Location: [facility];  Service: Orthopedics;  Laterality: Left;  90 mins  . SHOULDER ARTHROSCOPY Left    "cleaned arthritis out"  . SHOULDER SURGERY Right 2012   Rotator cuff surgery  . TONSILLECTOMY    . UVULOPALATOPHARYNGOPLASTY (UPPP)/TONSILLECTOMY/SEPTOPLASTY  2001    Family History  Problem Relation Age of Onset  . Cancer Father        lung  . Heart disease Father   . Hypertension Mother   . Seizures Neg Hx     Social History   Socioeconomic History  . Marital status: Single    Spouse name:  Not on file  . Number of children: 0  . Years of education: 88  . Highest education level: Not on file  Occupational History  . Occupation: disabled  Social Needs  . Financial resource strain: Not on file  . Food insecurity:    Worry: Not on file    Inability: Not on file  . Transportation needs:    Medical: Not on file    Non-medical: Not on file  Tobacco Use  . Smoking status: Former Smoker    Packs/day: 1.00    Years: 22.00    Pack years: 22.00    Types: Cigarettes    Last attempt to quit: 06/22/2012    Years since quitting: 6.2  . Smokeless tobacco: Current User    Types: Snuff  Substance and Sexual Activity  . Alcohol use: No    Alcohol/week: 0.0 standard drinks    Comment: none since 7/11  . Drug use: No  . Sexual activity: Never  Lifestyle  . Physical activity:    Days per week: Not on file    Minutes per session: Not on file  . Stress: Not on file  Relationships  . Social connections:    Talks on phone: Not on file    Gets together: Not on file    Attends religious service: Not on file    Active member of club or organization: Not on file    Attends meetings of clubs or organizations: Not on file    Relationship status: Not on file  . Intimate partner violence:    Fear of current or ex partner: Not on file    Emotionally abused: Not on file    Physically abused: Not on file    Forced sexual activity: Not on file  Other Topics Concern  . 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 ALPRAZolam (XANAX) 1 MG tablet Take half tablet Three times daily as needed. 45 tablet 1  . cetirizine (ZYRTEC) 10 MG tablet Take 1 tablet (10 mg total) by mouth daily. (Patient taking  differently: Take 10 mg by mouth daily as needed. )  90 tablet prn  . [facility] CALCIUM 600+D 600-800 MG-UNIT TABS TAKE 1 TABLET BY MOUTH 2 (TWO) TIMES DAILY BEFORE A MEAL. 180 tablet 3  . fluticasone (FLONASE) 50 MCG/ACT nasal spray PLACE 2 SPRAYS DAILY INTO BOTH NOSTRILS 48 g 3  . [START ON 10/21/2018] HYDROcodone-acetaminophen (NORCO) 5-325 MG tablet Take 1 tablet by mouth every 6 (six) hours as needed for moderate pain. 100 tablet 0  . HYDROcodone-acetaminophen (NORCO) 5-325 MG tablet Take 1 tablet by mouth every 6 (six) hours as needed for moderate pain. 100 tablet 0  . [START ON 11/20/2018] HYDROcodone-acetaminophen (NORCO/VICODIN) 5-325 MG tablet Take 1 tablet by mouth every 6 (six) hours as needed. for pain 100 tablet 0  . HYDROcodone-acetaminophen (NORCO/VICODIN) 5-325 MG tablet Take 1 tablet by mouth every 6 (six) hours as needed for moderate pain.    Marland Kitchen PHENobarbital (LUMINAL) 64.8 MG tablet TAKE 3 TABLETS BY MOUTH AT BEDTIME 270 tablet 0  . topiramate (TOPAMAX) 50 MG tablet Take one tablet in the morning and two tablets in the evening 270 tablet 3  . gabapentin (NEURONTIN) 300 MG capsule Take 3 capsules (900 mg total) by mouth 3 (three) times daily. 810 capsule 3  . Influenza Vac Subunit Quad (FLUCELVAX QUADRIVALENT) 0.5 ML SUSY Flucelvax Quad 2019-2020 (PF) 60 mcg (15 mcg x 4)/0.5 mL IM syringe  TO BE ADMINISTERED BY PHARMACIST FOR IMMUNIZATION    . methylPREDNISolone (MEDROL DOSEPAK) 4 MG TBPK tablet TAKE 6 TABLETS ON DAY 1 AS DIRECTED ON PACKAGE AND DECREASE BY 1 TAB EACH DAY FOR A TOTAL OF 6 DAYS    . triamcinolone cream (KENALOG) 0.1 % Apply 1 application topically 2 (two) times daily. To areas of itching on skin. (Patient not taking: Reported on 10/01/2018) 45 g 3   No facility-administered medications prior to visit.     Allergies  Allergen Reactions  . No Known Allergies     ROS     Objective:    Physical Exam  BP 112/64   Temp 97.9 F (36.6 C) (Oral)   Ht '5\' 9"'  (1.753 m)   Wt 165 lb (74.8 kg)   BMI 24.37 kg/m  Wt Readings  from Last 3 Encounters:  10/01/18 165 lb (74.8 kg)  09/03/18 163 lb 12.8 oz (74.3 kg)  08/20/18 166 lb (75.3 kg)    left Knee General Effusion: absent Erythema: absent Increased Warmth: absent  Tenderness: Posterior thigh mm  Range of Motion: limited in extension  eGFR 09/03/18 = 102 mL/min   Lab Results  Component Value Date   TSH 1.353 07/06/2013   Lab Results  Component Value Date   WBC 6.6 09/03/2018   HGB 13.2 09/03/2018   HCT 41.4 09/03/2018   MCV 95 09/03/2018   PLT 265 09/03/2018   Lab Results  Component Value Date   NA 138 09/03/2018   K 4.3 09/03/2018   CO2 25 09/03/2018   GLUCOSE 69 09/03/2018   BUN 7 09/03/2018   CREATININE 0.86 09/03/2018   BILITOT <0.2 09/03/2018   ALKPHOS 78 09/03/2018   AST 10 09/03/2018   ALT 9 09/03/2018   PROT 6.6 09/03/2018   ALBUMIN 4.4 09/03/2018   CALCIUM 9.6 09/03/2018   ANIONGAP 9 05/05/2017   Lab Results  Component Value Date   CHOL 238 (H) 07/26/2016   Lab Results  Component Value Date   HDL 41 07/26/2016   Lab Results  Component Value Date  [place] 160 (H) 07/26/2016   Lab Results  Component Value Date   TRIG 187 (H) 07/26/2016   Lab Results  Component Value Date   CHOLHDL 5.8 (H) 07/26/2016   Lab Results  Component Value Date   HGBA1C 5.1 12/17/2011       Assessment & Plan:   Problem List Items Addressed This Visit      High   Back pain of lumbar region with sciatica - Primary    Established problem worsened.  ROI Dr Rolena Infante last week office visit and spine MRI report Increase Gabapentin max 1200 mg TID Start Lioresal (Baclofen) 5 mg TID prn spasms leg  Other analgesic options could include NSAIDs (eGRF 12/19 = >100 mL/min) Inrease topiramate Add TCA/SNRI Capsacin Diclofenac topical         Relevant Medications   gabapentin (NEURONTIN) 600 MG tablet   baclofen (LIORESAL) 10 MG tablet   methylPREDNISolone (MEDROL DOSEPAK) 4 MG TBPK tablet       Meds ordered this  encounter  Medications  . gabapentin (NEURONTIN) 600 MG tablet    Sig: Take 2 tablets (1,200 mg total) by mouth 3 (three) times daily.    Dispense:  360 tablet    Refill:  3    Increase of daily dose to 3600 mg gabapentin  . baclofen (LIORESAL) 10 MG tablet    Sig: Take 0.5 tablets (5 mg total) by mouth 3 (three) times daily.    Dispense:  30 each    Refill:  0     Sherren Mocha [name], MD

## 2018-10-04 ENCOUNTER — Other Ambulatory Visit: Payer: Self-pay | Admitting: Neurology

## 2018-10-05 ENCOUNTER — Encounter: Payer: Self-pay | Admitting: Family Medicine

## 2018-10-05 NOTE — Progress Notes (Addendum)
Office Visit with Dr Melina Schools (Ortho) 09/24/18 CC: falls from left leg giving out.  Concern sudden change and weakness and difficulty ambulating from lumbar spine problem.  LS spine X-ray: no bovious deformity. No compression fx, no abnormal curvature or slip.  Disc space maintained throughout.  Hips with good joint spaces.  SI joint fusion hardware appears intact and in place.  MRI lumbar spine (09/25/18):no disc herniation nor foraminal stenosis, no central stenosis. Very small L5-1 central disc protrusion unchanged from previous exam, no neural compression.    A/ Low Back Pain  Plan: STAT Lumbar MRI Medrol dose pak LSO Brace Walker for stability Back exercises PT referral eval & tx Referral Dr Jaynee Eagles (neuro) for left leg pain and weakness

## 2018-10-06 ENCOUNTER — Telehealth: Payer: Self-pay | Admitting: Neurology

## 2018-10-06 ENCOUNTER — Ambulatory Visit: Payer: Medicare Other | Admitting: Neurology

## 2018-10-06 ENCOUNTER — Ambulatory Visit
Admission: RE | Admit: 2018-10-06 | Discharge: 2018-10-06 | Disposition: A | Discharge status: Home / Self Care | Payer: Medicare Other | Source: Ambulatory Visit | Attending: Neurology | Admitting: Neurology

## 2018-10-06 ENCOUNTER — Encounter: Payer: Self-pay | Admitting: Neurology

## 2018-10-06 VITALS — BP 130/76 | HR 59 | Ht 69.0 in | Wt 163.0 lb

## 2018-10-06 DIAGNOSIS — G894 Chronic pain syndrome: Secondary | ICD-10-CM

## 2018-10-06 DIAGNOSIS — M79605 Pain in left leg: Secondary | ICD-10-CM | POA: Diagnosis not present

## 2018-10-06 DIAGNOSIS — M25562 Pain in left knee: Secondary | ICD-10-CM | POA: Diagnosis not present

## 2018-10-06 DIAGNOSIS — Z87820 Personal history of traumatic brain injury: Secondary | ICD-10-CM

## 2018-10-06 DIAGNOSIS — M25462 Effusion, left knee: Secondary | ICD-10-CM | POA: Diagnosis not present

## 2018-10-06 DIAGNOSIS — G8929 Other chronic pain: Secondary | ICD-10-CM | POA: Diagnosis not present

## 2018-10-06 NOTE — Progress Notes (Signed)
Reason for visit: Leg pain and weakness  Referring physician: Dr. Rolena Infante Ms.  Richard Glass is a 48 y.o. 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  He has had excellent control of his seizures, currently he is on phenobarbital and Topamax.  The patient also takes gabapentin for pain issues.  He has had some low back pain, he has had a fusion of the left SI joint in the past.  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  The patient indicates that over the last 3 months he has had increasing problems with walking.  He claims that he has severe pain around the left knee, he may have swelling of the knee, he may have pain into the hamstrings that is worse with weightbearing, but also bothers him a lot at night when he tries to sleep.  The patient has had some falls, the last fall was 1 week ago.  He is now back to using a walker for ambulation, he has used a walker in the past as well.  The patient denies any changes in bowel bladder function.  He reports that both feet feel cold.  The patient has chronic issues with left-sided numbness involving the arm and the leg and he feels that the left arm is somewhat weak as well.  He does note some neck pain but this is not severe.  He denies any headaches, dizziness, vision changes or changes in speech or swallowing.  The patient comes to this office for an evaluation.  Past Medical History:  Diagnosis Date  . 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  . Allergic rhinitis 11/13/2006       . Anxiety   . Anxiety state 08/08/2013  . Asthma   . ASTHMA, INTERMITTENT 07/08/2010   Qualifier: Diagnosis of  By: Walker Kehr  MD, Patrick Jupiter    . Back pain of lumbar region with sciatica 11/13/2006   MRI 12/2014 - lumbar DDD without focal neural impingement  MRI Lumbar 10/30/16 ([place]) Small central L5-S1 disc extrusion with minimal cranial migration and associated annular fissure approaches descending left S1 nerve roots without contact or displacement. Minimal bulging disc height loss without spinal canal stenosis or neural foraminal narrowing.  Minimal bulging disc at L2-L3 through L4-L5 without spinal canal stenosis or neural foraminal narrowing.  MRI Sacrum 10/30/16 ([place]) No fracture. Small lesion left sacral ala most likely a subchondral cyst/erosion adjacent to the sacroiliac joint.     . 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 Carpal tunnel syndrome 01/09/2015   Left 12/2014   . Cervical spine pain 02/06/2015   MRI 03/2015 FINDINGS: Vertebral body height, signal and alignment are normal. The craniocervical junction is normal and cervical cord signal is normal. The central spinal canal and neural foramina are widely patent at all levels. Scattered, mild degenerative change appears most notable at C4-5. Imaged paraspinous structures are unremarkable.  IMPRESSION: Negative for central canal or foraminal narrowing. No finding to explain the patient's symptoms. Scattered, mild facet degenerative disease is noted.   . Chronic low back pain   . Chronic pain 01/18/2016  . Chronic pain syndrome 01/18/2016  .  Coccyx pain 02/06/2015  . Convulsions ([place]) 11/13/2006     Cystic encephalomalacia left temporal and parietal regions from remote injury.   . Degenerative arthritis   . Dyslipidemia   . External hemorrhoid, bleeding 06/04/2011  . GASTROESOPHAGEAL REFLUX, NO ESOPHAGITIS 11/13/2006   Qualifier: Diagnosis of  By: Eusebio Friendly    . Headache(784.0)   . Hyperlipidemia 11/13/2006   07/2016  ASCVD score of 4.7% - recommended lifestyle changes   . Insomnia 02/02/2013  .  Intention tremor 10/31/2009   Qualifier: Diagnosis of  By: Walker Kehr MD, Patrick Jupiter    . Intercostal pain 04/25/2015  . Left knee injury 05/09/2016  . 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 Morton's neuroma of left foot 01/18/2016  . Osteopenia 10/10/2016  . Pain in joint, shoulder region 07/05/2014   LEFT > Right Reports hx rotator cuff surgery on rigt shoulder previously (Dr Nicholes Stairs) but no notes available XRAY right shoulder showed some proliferative changes distal rigt clavicle   . Rash and nonspecific skin eruption 10/10/2016  . Restless leg 09/29/2007   Qualifier: Diagnosis of  By: Walker Kehr MD, Patrick Jupiter    . Restless legs syndrome (RLS)   . Sciatica of left side 10/26/2014  . Seizures ([employer] [facility])    last >66yrs  . SI (sacroiliac) pain 03/20/2017  . Sinusitis chronic, frontal   . Status post lumbar spinal fusion 03/20/2017  . Subacromial or subdeltoid bursitis 08/08/2009   MRI C-spine 2011([facility]) - normal MRI left shoulder 10/2010 ([facility]) - AC degenerative disease, normal rotator cuff 12/2012 -debridement, acromioplasty and distal clavicle excision of left shoulder, arthroscopy also performed an no need for rotator cuff repair - performed by [facility] 02/2013 - Nerve Conduction Studies ([facility]) - ulnar nerve compression 02/2013- taken back to OR for Ulnar nerve decompression 06/2013 Repeat MRI left shoulder - subacromial/subdeltoid bursitis, a small intersubstance tear to the distal infraspinatus and evidence of interval resection of the distal clavicle 06/2013 - steroid injection of left biceps 09/2013 -Repeat EMG showed improvement of ulnar nerve compression 10/2013 - referred to [facility] for second opinion due to intractable pain 11/2013 - Seen by Dr. Marlou Sa at [facility]; discussed risks/benefits of surgical intervention and bursectomy, no plan for surgery at this time     . 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 [place]   struck by golf ball-48 yrs old  . CRANIOTOMY     metal plate  . NASAL FRACTURE SURGERY  1997  . SACROILIAC JOINT FUSION Left 03/20/2017   Procedure: SACROILIAC JOINT FUSION;  Surgeon: Melina Schools, MD;  Location: [facility];  Service: Orthopedics;  Laterality: Left;  90 mins  . SHOULDER ARTHROSCOPY Left    "cleaned arthritis out"  . SHOULDER SURGERY Right 2012   Rotator cuff surgery  . TONSILLECTOMY    . UVULOPALATOPHARYNGOPLASTY (UPPP)/TONSILLECTOMY/SEPTOPLASTY  2001    Family History  Problem Relation Age of Onset  . Cancer Father        lung  . Heart disease Father   . Hypertension Mother   . Seizures Neg Hx     Social history:  reports that he quit smoking about 6 years ago. His smoking use included cigarettes. He has a 22.00 pack-year smoking history. His smokeless tobacco use includes snuff. He reports that he does not drink alcohol or use drugs.  Medications:  Prior to Admission medications   Medication Sig Start Date End Date Taking? Authorizing Provider  ALPRAZolam [name]) 1 MG tablet TAKE HALF (1/2) TABLET BY MOUTH THREE TIMES DAILY AS NEEDED. 10/05/18  Yes Kathrynn Ducking, MD  baclofen (LIORESAL) 10 MG tablet Take 0.5 tablets (5 mg total) by mouth 3 (three) times daily. 10/01/18  Yes [name], [name], MD  cetirizine (ZYRTEC) 10 MG tablet Take 1 tablet (10 mg total) by mouth daily. Patient taking differently: Take 10 mg by mouth daily as needed.  05/01/17  Yes [name], [name], MD  [employer] CALCIUM 600+D 600-800 MG-UNIT TABS TAKE 1 TABLET BY MOUTH 2 (TWO) TIMES DAILY BEFORE A MEAL. 06/22/18  Yes [name], [name], MD  fluticasone (FLONASE) 50 MCG/ACT nasal spray PLACE 2 SPRAYS DAILY INTO BOTH NOSTRILS 07/08/18  Yes [name], [name], MD  gabapentin (NEURONTIN) 600 MG tablet Take 2 tablets (1,200 mg total) by mouth 3 (three) times daily. 10/01/18  Yes [name], [name], MD    PHENobarbital (LUMINAL) 64.8 MG tablet TAKE 3 TABLETS BY MOUTH AT BEDTIME 07/20/18  Yes Kathrynn Ducking, MD  topiramate (TOPAMAX) 50 MG tablet Take one tablet in the morning and two tablets in the evening 04/27/18  Yes Ward Givens, NP  HYDROcodone-acetaminophen (NORCO) 5-325 MG tablet Take 1 tablet by mouth every 6 (six) hours as needed for moderate pain. Patient not taking: Reported on 10/06/2018 10/21/18   [name], [name], MD  HYDROcodone-acetaminophen (NORCO/VICODIN) 5-325 MG tablet Take 1 tablet by mouth every 6 (six) hours as needed. for pain Patient not taking: Reported on 10/06/2018 11/20/18   [name], [name], MD  triamcinolone cream (KENALOG) 0.1 % Apply 1 application topically 2 (two) times daily. To areas of itching on skin. Patient not taking: Reported on 10/01/2018 06/25/18   [name], [name], MD      Allergies  Allergen Reactions  . No Known Allergies     ROS:  Out of a complete 14 system review of symptoms, the patient complains only of the following symptoms, and all other reviewed systems are negative.  Joint pain, joint swelling, back pain, walking difficulty, neck pain Seizures, weakness Runny nose  Blood pressure 130/76, pulse (!) 59, height 5\' 9"  (1.753 m), weight 163 lb (73.9 kg).  Physical Exam  General: The patient is alert and cooperative at the time of the examination.  Eyes: Pupils are equal, round, and reactive to light. Discs are flat bilaterally.  Neck: The neck is supple, no carotid bruits are noted.  Respiratory: The respiratory examination is clear.  Cardiovascular: The cardiovascular examination reveals a regular rate and rhythm, no obvious murmurs or rubs are noted.  Skin: Extremities are without significant edema.  Neurologic Exam  Mental status: The patient is alert and oriented x 3 at the time of the examination. The patient has apparent normal recent and remote memory, with an apparently normal attention span and concentration  ability.  Cranial nerves: Facial symmetry is present. There is a slight decrease in pinprick sensation on the right face as compared to the left. The strength of the facial muscles and the muscles to head turning and shoulder shrug are normal bilaterally. Speech is well enunciated, no aphasia or dysarthria is noted. Extraocular movements are full. Visual fields are full. The tongue is midline, and the patient has symmetric elevation of the soft palate. No obvious hearing deficits are noted.  Motor: The motor testing reveals 5 over 5 strength of all 4 extremities.  The patient however has severe pain with flexion and extension at the left knee, some giveaway weakness is seen.  Good symmetric motor tone is noted throughout.  Sensory: Sensory testing is notable for decreased pinprick sensation on the left arm and legs compared the right, decreased vibration sensation on the left arm and leg.  Position sensation is decreased on the left, normal on the right.  No evidence of extinction was noted.  Coordination: Cerebellar testing reveals good finger-nose-finger and heel-to-shin bilaterally.  Gait and station: Gait is associated with a limping type gait, the patient normally will use a walker for ambulation.  Tandem gait was not attempted.  Romberg is negative.  Reflexes: Deep tendon reflexes are symmetric and normal bilaterally.  The ankle jerk reflexes are maintained bilaterally, some reduction of knee jerk reflexes are seen bilaterally.  Toes are downgoing bilaterally.   Assessment/Plan:  1.  History of left temporoparietal encephalomalacia, traumatic brain injury  2.  Left hemisensory deficit by clinical examination  3.  History of seizures, well controlled  4.  Chronic low back pain, status post left SI joint fusion  5.  Worsening gait, severe pain around the left knee and thigh  Clinical examination today does not show any true weakness, the patient reports severe pain with movement across  the left knee.  The patient has a left hemisensory deficit, but this does not correlate with his left temporoparietal encephalomalacia.  The patient will be sent for x-ray of the left knee.  He will be set up for nerve conduction studies on both legs and EMG on the left leg.  His gait disorder appears to be associated with severe pain, and giveaway of the left knee with weightbearing at times.  Jill Alexanders MD 10/06/2018 7:56 AM  [facility] [address] [place], [place] [address]  Phone [phone] Fax [phone]

## 2018-10-06 NOTE — Telephone Encounter (Signed)
  I called the patient.  The x-ray of the left knee did not show evidence of significant degenerative changes.  No etiology of his severe pain.  EMG and nerve conduction study is pending.  XR left knee 10/06/18:  IMPRESSION: Tiny knee joint effusion can not be excluded. No acute bony abnormality identified.

## 2018-10-25 ENCOUNTER — Other Ambulatory Visit: Payer: Self-pay | Admitting: Family Medicine

## 2018-10-25 ENCOUNTER — Other Ambulatory Visit: Payer: Self-pay | Admitting: Neurology

## 2018-10-25 DIAGNOSIS — M5440 Lumbago with sciatica, unspecified side: Secondary | ICD-10-CM

## 2018-11-03 ENCOUNTER — Ambulatory Visit: Payer: Medicare Other | Admitting: Neurology

## 2018-11-03 ENCOUNTER — Encounter: Payer: Self-pay | Admitting: Neurology

## 2018-11-03 ENCOUNTER — Ambulatory Visit (INDEPENDENT_AMBULATORY_CARE_PROVIDER_SITE_OTHER): Payer: Medicare Other | Admitting: Neurology

## 2018-11-03 DIAGNOSIS — M79605 Pain in left leg: Secondary | ICD-10-CM | POA: Diagnosis not present

## 2018-11-03 NOTE — Procedures (Signed)
     HISTORY:  Richard Glass is a 48 year old gentleman with a history of left leg and knee pain that occurs with weightbearing, mainly centered around the lateral aspect of the left knee and into the hamstring area.  The pain is improved when he is off of his feet.  The patient is being evaluated for this discomfort.  NERVE CONDUCTION STUDIES:  Nerve conduction studies were performed on both lower extremities.  The distal motor latencies and motor amplitudes for the peroneal nerves and posterior tibial nerves were within normal limits bilaterally.  Slowing was seen above and below the knees for the peroneal nerves bilaterally, borderline normal for the posterior tibial nerves bilaterally.  The sensory latencies for the sural and peroneal nerves were normal bilaterally.  The F-wave latencies for the posterior tibial nerves were prolonged bilaterally.  EMG STUDIES:  EMG study was performed on the left lower extremity:  The tibialis anterior muscle reveals 2 to 4K motor units with full recruitment. No fibrillations or positive waves were seen. The peroneus tertius muscle reveals 2 to 4K motor units with full recruitment. No fibrillations or positive waves were seen. The medial gastrocnemius muscle reveals 1 to 3K motor units with full recruitment. No fibrillations or positive waves were seen. The vastus lateralis muscle reveals 2 to 4K motor units with full recruitment. No fibrillations or positive waves were seen. The iliopsoas muscle reveals 2 to 4K motor units with full recruitment. No fibrillations or positive waves were seen. The biceps femoris muscle (long head) reveals 2 to 4K motor units with full recruitment. No fibrillations or positive waves were seen. The lumbosacral paraspinal muscles were tested at 3 levels, and revealed no abnormalities of insertional activity at all 3 levels tested. There was fair relaxation.   IMPRESSION:  Nerve conduction studies done on both lower  extremities shows slight slowing for the peroneal nerves bilaterally with sensory sparing, no clear evidence of a peripheral neuropathy is seen.  EMG evaluation of the left lower extremity is relatively unremarkable without evidence of an overlying lumbosacral radiculopathy.   Jill Alexanders MD 11/03/2018 9:39 AM  [facility] [address] [place], [place] [address]  Phone [phone] Fax [phone]

## 2018-11-03 NOTE — Progress Notes (Signed)
Please refer to EMG and nerve conduction procedure note.  

## 2018-11-03 NOTE — Progress Notes (Addendum)
The patient comes back for EMG and nerve conduction study evaluation.  The study shows mild slowing of the peroneal nerves bilaterally with sensory sparing, otherwise no clear evidence of a peripheral neuropathy.  EMG evaluation of the left lower extremity is unremarkable, no evidence of an overlying lumbosacral radiculopathy is seen.  The patient continues to indicate that the pain is worse with weightbearing and walking, most of the pain is around the lateral portion of the left knee, and into the hamstrings on the left.  The patient reports swelling of the hamstring area at times.  I would be concerned about the possibility of an iliotibial band syndrome with this patient.  The patient indicates that he has had a recent MRI of the lumbar spine done through Dr. Rolena Infante, I will try to get the report of this.      [place]    Nerve / Sites Muscle Latency Ref. Amplitude Ref. Rel Amp Segments Distance Velocity Ref. Area    ms ms mV mV %  cm m/s m/s mVms  R Peroneal - EDB     Ankle EDB 5.1 ?6.5 2.5 ?2.0 100 Ankle - EDB 9   8.0     Fib head EDB 12.3  2.2  88 Fib head - Ankle 30 41 ?44 8.8     Pop fossa EDB 14.8  2.1  95.3 Pop fossa - Fib head 10 40 ?44 10.2         Pop fossa - Ankle      L Peroneal - EDB     Ankle EDB 4.8 ?6.5 3.7 ?2.0 100 Ankle - EDB 9   14.7     Fib head EDB 12.0  3.3  89.4 Fib head - Ankle 30 42 ?44 12.9     Pop fossa EDB 14.4  3.1  94.7 Pop fossa - Fib head 10 42 ?44 12.1         Pop fossa - Ankle      R Tibial - AH     Ankle AH 4.2 ?5.8 9.9 ?4.0 100 Ankle - AH 9   30.0     Pop fossa AH 14.1  6.1  61.8 Pop fossa - Ankle 40 41 ?41 22.1  L Tibial - AH     Ankle AH 4.1 ?5.8 12.0 ?4.0 100 Ankle - AH 9   27.1     Pop fossa AH 13.9  9.4  78.8 Pop fossa - Ankle 40 41 ?41 28.7             SNC    Nerve / Sites Rec. Site Peak Lat Ref.  Amp Ref. Segments Distance    ms ms V V  cm  R Sural - Ankle (Calf)     Calf Ankle 3.8 ?4.4 15 ?6 Calf - Ankle 14  L Sural - Ankle (Calf)    Calf Ankle 3.6 ?4.4 16 ?6 Calf - Ankle 14  R Superficial peroneal - Ankle     Lat leg Ankle 4.0 ?4.4 12 ?6 Lat leg - Ankle 14  L Superficial peroneal - Ankle     Lat leg Ankle 4.2 ?4.4 15 ?6 Lat leg - Ankle 14              F  Wave    Nerve F Lat Ref.   ms ms  R Tibial - AH 59.2 ?56.0  L Tibial - AH 59.8 ?56.0

## 2018-11-04 ENCOUNTER — Telehealth: Payer: Self-pay | Admitting: Neurology

## 2018-11-04 NOTE — Telephone Encounter (Signed)
I have received the results of the MRI of the lumbar spine done on 25 September 2018.  MRI lumbar September 25, 2018:  1.  No change in a very small central disc herniation at L5-S1 without stenosis or nerve root impingement.  2.  No new disc pathology or stenosis.   Recent EMG and nerve conduction study evaluation does not show a neuropathic etiology for his left leg pain, I would be concerned given the location of the pain that he may have an iliotibial band syndrome.  I have recommended a reevaluation with this in mind with Dr. Rolena Infante.

## 2018-11-19 ENCOUNTER — Other Ambulatory Visit: Payer: Self-pay

## 2018-11-19 ENCOUNTER — Encounter: Payer: Self-pay | Admitting: Family Medicine

## 2018-11-19 ENCOUNTER — Ambulatory Visit (HOSPITAL_COMMUNITY)
Admission: RE | Admit: 2018-11-19 | Discharge: 2018-11-19 | Disposition: A | Discharge status: Home / Self Care | Payer: Medicare Other | Source: Ambulatory Visit | Attending: Family Medicine | Admitting: Family Medicine

## 2018-11-19 ENCOUNTER — Ambulatory Visit (INDEPENDENT_AMBULATORY_CARE_PROVIDER_SITE_OTHER): Payer: Medicare Other | Admitting: Family Medicine

## 2018-11-19 VITALS — BP 118/74 | HR 79 | Temp 98.0°F | Ht 69.0 in | Wt 168.0 lb

## 2018-11-19 DIAGNOSIS — M25562 Pain in left knee: Secondary | ICD-10-CM | POA: Insufficient documentation

## 2018-11-19 DIAGNOSIS — M5440 Lumbago with sciatica, unspecified side: Secondary | ICD-10-CM | POA: Diagnosis not present

## 2018-11-19 DIAGNOSIS — R208 Other disturbances of skin sensation: Secondary | ICD-10-CM

## 2018-11-19 DIAGNOSIS — G8929 Other chronic pain: Secondary | ICD-10-CM

## 2018-11-19 DIAGNOSIS — M79605 Pain in left leg: Secondary | ICD-10-CM

## 2018-11-19 DIAGNOSIS — G894 Chronic pain syndrome: Secondary | ICD-10-CM

## 2018-11-19 DIAGNOSIS — D169 Benign neoplasm of bone and articular cartilage, unspecified: Secondary | ICD-10-CM

## 2018-11-19 DIAGNOSIS — S8992XA Unspecified injury of left lower leg, initial encounter: Secondary | ICD-10-CM | POA: Diagnosis not present

## 2018-11-19 MED ORDER — HYDROCODONE-ACETAMINOPHEN 7.5-325 MG PO TABS: 1.0000 | ORAL_TABLET | Freq: Four times a day (QID) | ORAL | 0 refills | Status: DC | PRN

## 2018-11-19 MED ORDER — DULOXETINE HCL 30 MG PO CSDR: DELAYED_RELEASE_CAPSULE | ORAL | 2 refills | Status: DC

## 2018-11-19 NOTE — Patient Instructions (Addendum)
Dr Shatara Stanek believes the left knee pain has gotten worse because the chronic pain has caused you to feel pain even when you are being touches.  Increase the Hydrocodone to 7.5 mg tablets.  About a week before you run out of this dose, call Dr Velma Agnes's office for refills  Start a new medicine for your leg pain called duloxetine.  It help change how you feel pain.  Take one capsule each morning for 7 days, then take two capsules each morning until you see Dr Sheva Mcdougle.   Go for the Xray of your left knee at your convenience at [place] department.

## 2018-11-20 ENCOUNTER — Encounter: Payer: Self-pay | Admitting: Family Medicine

## 2018-11-20 DIAGNOSIS — R208 Other disturbances of skin sensation: Secondary | ICD-10-CM | POA: Insufficient documentation

## 2018-11-20 DIAGNOSIS — M79605 Pain in left leg: Secondary | ICD-10-CM | POA: Insufficient documentation

## 2018-11-20 NOTE — Progress Notes (Signed)
Subjective:    Patient ID: Richard Glass, male    DOB: Oct 19, 1970, 48 y.o.   MRN: [id number] Richard Glass is alone Sources of clinical information for visit is/are patient and past medical records. Nursing assessment for this office visit was reviewed with the patient for accuracy and revision.   Previous Report(s) Reviewed: historical medical records, imaging reports: Left Knee Xray 10/06/18  and x-ray reports  I spoke with Radiologist, Dr Zigmund Daniel about knee Xray 11/19/18.  Depression screen PHQ 2/9 11/19/2018  Decreased Interest 0  Down, Depressed, Hopeless 0  PHQ - 2 Score 0  Altered sleeping -  Tired, decreased energy -  Change in appetite -  Feeling bad or failure about yourself  -  Trouble concentrating -  Moving slowly or fidgety/restless -  Suicidal thoughts -  PHQ-9 Score -  Some recent data might be hidden   Fall Risk  11/19/2018 10/06/2018 07/08/2017 05/01/2017 02/28/2017  Falls in the past year? 1 1 No Yes No  Comment - - - - -  Number falls in past yr: 1 1 - 1 -  Injury with Fall? 0 0 - No -  Comment - soreness - - -  Risk for fall due to : - Other (Comment) - - -  Risk for fall due to: Comment - left leg giving out on me - - -  Some encounter information is confidential and restricted. Go to Review Flowsheets activity to see all data.    History/P.E. limitations: mild cognitive impairment with mild recent memory impairment   Adult vaccines due  Topic Date Due  . TETANUS/TDAP  11/21/2027   There are no preventive care reminders to display for this patient. There are no preventive care reminders to display for this patient.   Chief Complaint  Patient presents with  . Medication Refill  . Leg Pain     HPI  10/01/18 [facility] Office Visit Patient is in today for left leg pain Onset: several years ago, recent worsening in last couple months Location: left leg, esp in posterior left thigh Quality: burning, cramping, especially with extension of left knee Severity:  rated 10/10  Pattern: intermittent, precipitated Course: recent worsening Radiation: buttock to posterior left knee Relief: hydrocodoje/apap, gabapentin Precipitant: extension of left knee or full weight bearing left leg Associated Symptoms:       Restricted ROM/stiffness/swelling:  Avoids full extension of left knee       Muscle ache/cramp/spasms: posterior thigh cramps       Muscle strength change: no       Change in sensation (dysesthesia/itch or numbness): no tingling, (+) burning Functional Impact:       Change in social or recreation: none                ADLs: independent.  (+) driving. He fell off commode last week.  No LOC.  No head trauma.  Fell when leaning forward to get off commode.         Change in mood/energy level: no change       Change in relationships: no change       Change in work activities: Working outside in yard though slower and with more consciousness of movements               Last time worked: this week       Change in sleep from pain: no Trauma (Acute or Chronic): No recent acute injuries to back or leg  Prior Diagnostic  Testing or Treatments:  MRI Lumbar 10/30/16 ([place]) Small central L5-S1 disc extrusion with minimal cranial migration and associated annular fissure approaches descending left S1 nerve roots without contact or displacement. Minimal bulging disc height loss without spinal canal stenosis or neural foraminal narrowing.  Minimal bulging disc at L2-L3 through L4-L5 without spinal canal stenosis or neural foraminal narrowing.  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  Pt recalls MRI of back obtained last week.  He is uncertain of its findings.  Dr Rolena Infante may have referred patient to another physician "about my leg."  Pt may have been referred to Physical Therapy as well.   Dr Rolena Infante prescribed  a rolling walker and corseted lumbar brace  Dr Rolena Infante may have prescribed a Medrol dose pak disp 09/24/18.  Which pt has been wearing since last week.  Pt reports that Dr Rolena Infante told pt to see his PCP for increase dose of his gabapentin.   Patient seen by Dr Wallie Char, DPM 08/25/18, for left lateral foot pain.  He was treated for capsulitis with steroid injection.     11/19/18 [facility] office visit - No change in the longstanding left knee pain, complaints of left knee pain goes back to 10/21/2006 per radiology report complaint section has chronic left knee pain and swelling.  Per ov note from Dr Alger Simons (PM&R) patient treated for multiple pain complaints(lumbar fascet arthropathy, painful peripheral neuropathy, osteoarthritis of left knee Vs tendinitis) with Lyrica, Extended-Release Morphine ([place]), and Vicodin. He was senistive to pinprick and touch in both legs.   - Dr Ree Kida (FM-PCP) attended pt's complaint of left knee pain.  Patient on Pamelor and gabapentin.  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 Pt complained to Dr Leonides Schanz of pain all down left side of body down to his foot.  Dr Leonides Schanz thought pain is radicular fro spine and SI jopint.  He recommended Follow up with Dr Rolena Infante and FM-PCP.   - Location is generalized about left knee with a constant burning pain with intermittent painful "pins and needles"  that has worsened over last four to six months.  The most pain is posterior knee and thigh, but has pain into left shin and left lateral foot around 5th metatarsal tuberosity.  He is wearing triple wide toe box tennis shoes and still has pain in left foot.   - He feels swelling in back of left knee and distal left posterior thigh, especially when the pain is more intense.  - Increase in gabapentin to 1200 mg TID in January has provided only small degree of pain relief.  The cramping in both his legs has improved  a moderate degree since start of baclofen in January.  - Patient recalls injection into left knee by sports medicine that may have helped, but only for about a week.    - It is worse at night and is making both falling asleep and maintaining sleep difficult.  He will awaken at night with severe left leg pain.  - Walking and weightbearing also worsen pain compared to being seated.    - He is able to go outside to attend to his neighbor's farm animals, but uses a cane to support himself off his left leg.   - He uses a rolling walker when  he is inside.  He has been wearing a corseted lumbar brace daily.   - The left leg has buckled at least twice since last ov. The most recent event a week or two ago occurred while walking across kitchen.  He fell onto his left knee.  - Dr Jannifer Franklin evaluated patient in [place] this year on referral from Drema Pry for the left leg pain to see if the pain was of neurologic origin.  EMG/[facility]ins studies in February showed only mild slowing of bilateral peroneal nerves with sensory sparing.  There was no evidence of lumbosacral radiculopathy. Dr Jannifer Franklin thought the pain may be from Iliotibial Band syndrome and wanted patient to return to be evaluated for this by Dr Rolena Infante.     SH: Dips tobacco.  Former smoker.   Review of Systems See HPI    Objective:   Physical Exam VS reviewed GEN: Alert, Cooperative, Groomed, NAD Left leg: Normal appearance knee and foreleg/foot.  No increased warmth nor muscle wasting.  No edema around knee.  Tender to light touch of anterior, latera/medial, and posterior knee.  Tender to light palpation of left calf and left hamstrings.  Gait: Atalgic gait with shorter left leg stance phase. Using rolling walker.   Psych: Confuses which physicians have treated him with what.    Xray 4V left knee 11/19/18 discussed with Dr Zigmund Daniel, interpreting radiologist: no abnormalities. No cortical disruption in proximal tibular head where enchondroma  demonstrated on 2017 l. Knee MRI.     Assessment & Plan:  Visit Problem List with A/P  Chronic left leg pain Established problem. Complaint first found in EMR in 2008.  No evidence of fibular enchondroma changing to incite pain Uncontrolled pain but uncertain this pain will be "cured" but only mitigated with avoiding harms as best we can.  He remains able to work outdoors.    His description of continuous pain out of proportion to inciting event, hyperalgesia/allodynia, reports of edema of left knee and thigh, with no other explanation is consistent with peripheral and central pain sensitization process.   Plan: Follow up recommendation by Dr Jannifer Franklin by referring patient to Dr Rolena Infante for possible Iliotibial band or other musculoskeletal cause of pain.   Filled Hydrocodone/APAP 7.5 mg tablet #100 for one month.  Start duloxetine 30 mg daily x 7 days then 60 mg daily until see Dr Ivett Luebbe again in 3 months.  Continue gabapentin 1200 mg TID May add topical lidocaine in future  Needs counseling therapy to help him cope with chronic pain complaint.     Marland Kitchen

## 2018-11-20 NOTE — Assessment & Plan Note (Addendum)
Established problem. Complaint first found in EMR in 2008.  No evidence of fibular enchondroma changing to incite pain Uncontrolled pain but uncertain this pain will be "cured" but only mitigated with avoiding harms as best we can.  He remains able to work outdoors.    His description of continuous pain out of proportion to inciting event, hyperalgesia/allodynia, reports of edema of left knee and thigh, with no other explanation is consistent with peripheral and central pain sensitization process.   Plan: Follow up recommendation by Dr Jannifer Franklin by referring patient to Dr Rolena Infante for possible Iliotibial band or other musculoskeletal cause of pain.   Filled Hydrocodone/APAP 7.5 mg tablet #100 for one month.  Start duloxetine 30 mg daily x 7 days then 60 mg daily until see Dr [name] again in 3 months.  Continue gabapentin 1200 mg TID May add topical lidocaine in future  Needs counseling therapy to help him cope with chronic pain complaint.     Marland Kitchen

## 2018-11-23 DIAGNOSIS — M5136 Other intervertebral disc degeneration, lumbar region: Secondary | ICD-10-CM | POA: Diagnosis not present

## 2018-11-26 ENCOUNTER — Ambulatory Visit: Payer: Medicare Other | Admitting: Family Medicine

## 2018-12-01 ENCOUNTER — Encounter: Payer: Self-pay | Admitting: Family Medicine

## 2018-12-01 NOTE — Progress Notes (Signed)
error 

## 2018-12-01 NOTE — Progress Notes (Unsigned)
Summary office visit with Dr Retia Passe (ortho) 11/25/18   Patient followed up with Dr Rolena Infante on recommendation of Dr Jannifer Franklin (Neuro) CC: left low back pain PHysical: Continues to have generalized lower extremity weakness.  Required walker to ambulate.   DR Rolena Infante reviewed EMG/NG report by Dr Jannifer Franklin on 11/03/18.    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   Dr Rolena Infante does not think there is any spine issue to address to improve patient's weakenss.   Copy of actual lumbar spine given to patient to take to Dr Jannifer Franklin to [facility] for peripheral nerve issue.  Dr Rolena Infante office made the Neurology referral   Follow up with Dr Rolena Infante prn

## 2018-12-04 ENCOUNTER — Other Ambulatory Visit: Payer: Self-pay | Admitting: Neurology

## 2018-12-07 ENCOUNTER — Other Ambulatory Visit: Payer: Self-pay | Admitting: *Deleted

## 2018-12-07 DIAGNOSIS — M5440 Lumbago with sciatica, unspecified side: Secondary | ICD-10-CM

## 2018-12-07 DIAGNOSIS — M25562 Pain in left knee: Principal | ICD-10-CM

## 2018-12-07 DIAGNOSIS — D169 Benign neoplasm of bone and articular cartilage, unspecified: Secondary | ICD-10-CM

## 2018-12-07 DIAGNOSIS — G8929 Other chronic pain: Secondary | ICD-10-CM

## 2018-12-07 DIAGNOSIS — M79605 Pain in left leg: Secondary | ICD-10-CM

## 2018-12-07 DIAGNOSIS — G894 Chronic pain syndrome: Secondary | ICD-10-CM

## 2018-12-07 MED ORDER — HYDROCODONE-ACETAMINOPHEN 7.5-325 MG PO TABS: 1.0000 | ORAL_TABLET | Freq: Four times a day (QID) | ORAL | 0 refills | Status: DC | PRN

## 2019-01-18 ENCOUNTER — Other Ambulatory Visit: Payer: Self-pay | Admitting: *Deleted

## 2019-01-18 DIAGNOSIS — G894 Chronic pain syndrome: Secondary | ICD-10-CM

## 2019-01-18 DIAGNOSIS — M79605 Pain in left leg: Secondary | ICD-10-CM

## 2019-01-18 DIAGNOSIS — G8929 Other chronic pain: Secondary | ICD-10-CM

## 2019-01-18 DIAGNOSIS — D169 Benign neoplasm of bone and articular cartilage, unspecified: Secondary | ICD-10-CM

## 2019-01-18 DIAGNOSIS — M5440 Lumbago with sciatica, unspecified side: Secondary | ICD-10-CM

## 2019-01-18 DIAGNOSIS — M25562 Pain in left knee: Principal | ICD-10-CM

## 2019-01-18 MED ORDER — HYDROCODONE-ACETAMINOPHEN 7.5-325 MG PO TABS: 1.0000 | ORAL_TABLET | Freq: Four times a day (QID) | ORAL | 0 refills | Status: DC | PRN

## 2019-01-28 ENCOUNTER — Other Ambulatory Visit: Payer: Self-pay | Admitting: Neurology

## 2019-02-03 ENCOUNTER — Other Ambulatory Visit: Payer: Self-pay | Admitting: Family Medicine

## 2019-02-03 DIAGNOSIS — D169 Benign neoplasm of bone and articular cartilage, unspecified: Secondary | ICD-10-CM

## 2019-02-03 DIAGNOSIS — G894 Chronic pain syndrome: Secondary | ICD-10-CM

## 2019-02-03 DIAGNOSIS — G8929 Other chronic pain: Secondary | ICD-10-CM

## 2019-02-03 DIAGNOSIS — M5440 Lumbago with sciatica, unspecified side: Secondary | ICD-10-CM

## 2019-02-03 DIAGNOSIS — M25562 Pain in left knee: Secondary | ICD-10-CM

## 2019-02-03 NOTE — Telephone Encounter (Signed)
Pt is calling for a refill on his Hydrocodone. jw

## 2019-02-04 MED ORDER — HYDROCODONE-ACETAMINOPHEN 7.5-325 MG PO TABS: 1.0000 | ORAL_TABLET | Freq: Four times a day (QID) | ORAL | 0 refills | Status: DC | PRN

## 2019-02-23 ENCOUNTER — Telehealth: Payer: Self-pay

## 2019-02-23 NOTE — Telephone Encounter (Signed)
Unable to get in contact with the patient to r/s his appt with Judson Roch and convert it into a doxy.me visit. Both numbers on file were busy so I was unable to leave a voicemail. I will attempt to call back later.   If patient calls back please r/s and convert their appt into a doxy.me visit

## 2019-03-04 ENCOUNTER — Other Ambulatory Visit: Payer: Self-pay | Admitting: Family Medicine

## 2019-03-04 DIAGNOSIS — M5440 Lumbago with sciatica, unspecified side: Secondary | ICD-10-CM

## 2019-03-04 DIAGNOSIS — G894 Chronic pain syndrome: Secondary | ICD-10-CM

## 2019-03-04 DIAGNOSIS — D169 Benign neoplasm of bone and articular cartilage, unspecified: Secondary | ICD-10-CM

## 2019-03-04 DIAGNOSIS — G8929 Other chronic pain: Secondary | ICD-10-CM

## 2019-03-04 MED ORDER — HYDROCODONE-ACETAMINOPHEN 7.5-325 MG PO TABS: 1.0000 | ORAL_TABLET | Freq: Four times a day (QID) | ORAL | 0 refills | Status: DC | PRN

## 2019-03-05 ENCOUNTER — Ambulatory Visit: Payer: Medicare Other | Admitting: Neurology

## 2019-03-11 ENCOUNTER — Other Ambulatory Visit: Payer: Self-pay | Admitting: Family Medicine

## 2019-03-11 DIAGNOSIS — M8588 Other specified disorders of bone density and structure, other site: Secondary | ICD-10-CM

## 2019-04-06 ENCOUNTER — Other Ambulatory Visit: Payer: Self-pay | Admitting: *Deleted

## 2019-04-06 DIAGNOSIS — M79605 Pain in left leg: Secondary | ICD-10-CM

## 2019-04-06 DIAGNOSIS — D169 Benign neoplasm of bone and articular cartilage, unspecified: Secondary | ICD-10-CM

## 2019-04-06 DIAGNOSIS — G894 Chronic pain syndrome: Secondary | ICD-10-CM

## 2019-04-06 DIAGNOSIS — G8929 Other chronic pain: Secondary | ICD-10-CM

## 2019-04-06 DIAGNOSIS — M5440 Lumbago with sciatica, unspecified side: Secondary | ICD-10-CM

## 2019-04-06 MED ORDER — GABAPENTIN 600 MG PO TABS: 1200.0000 mg | ORAL_TABLET | Freq: Three times a day (TID) | ORAL | 3 refills | Status: AC

## 2019-04-06 MED ORDER — HYDROCODONE-ACETAMINOPHEN 7.5-325 MG PO TABS: 1.0000 | ORAL_TABLET | Freq: Four times a day (QID) | ORAL | 0 refills | Status: DC | PRN

## 2019-04-09 ENCOUNTER — Other Ambulatory Visit: Payer: Self-pay | Admitting: Family Medicine

## 2019-04-09 DIAGNOSIS — M5440 Lumbago with sciatica, unspecified side: Secondary | ICD-10-CM

## 2019-04-18 ENCOUNTER — Other Ambulatory Visit: Payer: Self-pay | Admitting: Neurology

## 2019-04-20 ENCOUNTER — Ambulatory Visit (INDEPENDENT_AMBULATORY_CARE_PROVIDER_SITE_OTHER): Payer: Medicare Other | Admitting: Family Medicine

## 2019-04-20 ENCOUNTER — Other Ambulatory Visit: Payer: Self-pay

## 2019-04-20 VITALS — BP 126/74 | HR 71

## 2019-04-20 DIAGNOSIS — L559 Sunburn, unspecified: Secondary | ICD-10-CM

## 2019-04-20 MED ORDER — DICLOFENAC SODIUM 1 % TD CREA: 1.0000 "application " | TOPICAL_CREAM | Freq: Two times a day (BID) | TRANSDERMAL | Status: DC

## 2019-04-20 NOTE — Assessment & Plan Note (Addendum)
Patient with mild to moderate sunburn.  Minimal relief with traditional OTC measures.  Cannot use NSAIDs due to stomach upset.  Trial of topical diclofenac has some reported evidence for improving sunburn.  Also encourage patient to wear sunblock to prevent further burn as he is in the sun quite a bit.  Also recommend patient continue use aloe vera as needed for soothing skin.  Patient to return if not improved.

## 2019-04-20 NOTE — Patient Instructions (Signed)
It was a pleasure to see you today! Thank you for choosing [facility] for your primary care. Richard Glass was seen for sunburn.   Try the topical diclofenac 2 times a day as needed for pain from the burn.  Continued use aloe vera or calamine lotion as needed for itch relief and soothing of the pain.  Continue to wear high potency sunscreen such as SPF 70 when out in the sun to protect your skin.  Try to avoid the sun between the hours of 10 and 4 as much as possible.  Try to cover head and extremities with clothing to prevent further burn.   Come back to the clinic if there is no relief  Best,  Marny Lowenstein, MD, [facility] - PGY3 04/20/2019 3:34 PM

## 2019-04-20 NOTE — Progress Notes (Signed)
    Subjective:  Richard Glass is a 48 y.o. male who presents to the [facility] today with a chief complaint of sunburn.   HPI: Patient complaining about 2 weeks of sunburn.  It encompasses bilateral upper extremities head, back of neck.  Patient is tried cool compresses, aloe vera, Tylenol, calamine lotion, benzocaine.  Patient's had no relief with this.  It is associated with pain and itching.  Patient says that he wears sunscreen when outside.  Patient is outside quite a bit.  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  The sunburn is not getting any better.  Patient cannot take ibuprofen due to stomach upset.  ROS: Per HPI   Objective:  Physical Exam: BP 126/74   Pulse 71   SpO2 96%   Gen: NAD, resting comfortably Pulm: NWOB,  GI: Normal bowel sounds present. Soft, Nontender, Nondistended. Skin: Mild to moderate sunburn on bilateral upper extremities, on balding head, back of neck, some skin peeling, no blisters observed. Neuro: grossly normal, moves all extremities Psych: Normal affect and thought content  No results found for this or any previous visit (from the past 72 hour(s)).   Assessment/Plan:  Burn from the sun Patient with mild to moderate sunburn.  Minimal relief with traditional OTC measures.  Cannot use NSAIDs due to stomach upset.  Trial of topical diclofenac has some reported evidence for improving sunburn.  Also encourage patient to wear sunblock to prevent further burn as he is in the sun quite a bit.  Also recommend patient continue use aloe vera as needed for soothing skin.  Patient to return if not improved.   Lab Orders  No laboratory test(s) ordered today    Meds ordered this encounter  Medications  . Diclofenac Sodium 1 % CREA    Sig: Place 1 application onto the skin 2 (two) times daily.    Dispense:  120 g      Marny Lowenstein, MD, MS FAMILY MEDICINE RESIDENT - PGY3 04/20/2019 3:43 PM

## 2019-04-29 ENCOUNTER — Other Ambulatory Visit: Payer: Self-pay | Admitting: *Deleted

## 2019-04-29 DIAGNOSIS — D169 Benign neoplasm of bone and articular cartilage, unspecified: Secondary | ICD-10-CM

## 2019-04-29 DIAGNOSIS — M25562 Pain in left knee: Secondary | ICD-10-CM

## 2019-04-29 DIAGNOSIS — M5440 Lumbago with sciatica, unspecified side: Secondary | ICD-10-CM

## 2019-04-29 DIAGNOSIS — G8929 Other chronic pain: Secondary | ICD-10-CM

## 2019-04-29 DIAGNOSIS — G894 Chronic pain syndrome: Secondary | ICD-10-CM

## 2019-04-29 MED ORDER — HYDROCODONE-ACETAMINOPHEN 7.5-325 MG PO TABS: 1.0000 | ORAL_TABLET | Freq: Four times a day (QID) | ORAL | 0 refills | Status: DC | PRN

## 2019-04-30 ENCOUNTER — Other Ambulatory Visit: Payer: Self-pay

## 2019-04-30 ENCOUNTER — Ambulatory Visit (INDEPENDENT_AMBULATORY_CARE_PROVIDER_SITE_OTHER): Payer: Medicare Other | Admitting: Family Medicine

## 2019-04-30 VITALS — BP 118/60 | HR 63 | Wt 156.4 lb

## 2019-04-30 DIAGNOSIS — R21 Rash and other nonspecific skin eruption: Secondary | ICD-10-CM | POA: Diagnosis not present

## 2019-04-30 DIAGNOSIS — H9193 Unspecified hearing loss, bilateral: Secondary | ICD-10-CM

## 2019-04-30 MED ORDER — TRIAMCINOLONE ACETONIDE 0.1 % EX OINT: 1.0000 "application " | TOPICAL_OINTMENT | Freq: Two times a day (BID) | CUTANEOUS | 1 refills | Status: DC

## 2019-04-30 NOTE — Patient Instructions (Signed)
It was a pleasure seeing you today.   Today we discussed your hearing loss and rash  For your hearing loss: I have referred you to an ear nose and throat doctor.  They will check your hearing and give you further recommendations.  If you do not hear back in 1 to 2 weeks please call our office and we will help with a referral  For your rash: I have prescribed you an ointment called triamcinolone.  Use this on the areas that are itching.  Please try not to scratch.  Please follow up in 1 month or sooner if symptoms persist or worsen. Please call the clinic immediately if you have any concerns.   Our clinic's number is [phone]. Please call with questions or concerns.   Please go to the emergency room if you have shortness of breath, chest pain, vision changes, facial drooping.  Thank you,  Caroline More, DO

## 2019-04-30 NOTE — Progress Notes (Signed)
   Subjective:    Patient ID: Richard Glass, male    DOB: December 29, 1970, 48 y.o.   MRN: [id number]   CC: hearing loss and f/u of rash  HPI: Hearing loss Patient reports progressively worsening hearing loss x2 weeks. Reports it began in his right side and now he has almost no hearing from that side. Now has started on his left side as well. Reports hearing a constant crackling noise. Reports frontal headache as well. Denies fever. No medication changes. No dizziness. No trauma.   Rash Recently seen by Dr. Grandville Silos on 8/4 for rash. At that time was recommended to use voltaren gel. Patient states that he has tried voltaren gel, cortisone, and lidocaine but no relief. Reports the area is very itchy all the time. Rash is present on left side of neck, arm, and left leg. Denies rash in palms and soles.    Objective:  BP 118/60   Pulse 63   Wt 156 lb 6.4 oz (70.9 kg)   SpO2 100%   BMI 23.10 kg/m  Vitals and nursing note reviewed  General: well nourished, in no acute distress HEENT: normocephalic, TM's visualized bilaterally, no scleral icterus or conjunctival pallor, no nasal discharge, moist mucous membranes, good dentition without erythema or discharge noted in posterior oropharynx Neck: supple, non-tender, without lymphadenopathy Cardiac: RRR, clear S1 and S2, no murmurs, rubs, or gallops Respiratory: clear to auscultation bilaterally, no increased work of breathing Abdomen: soft, nontender, nondistended, no masses or organomegaly. Bowel sounds present Extremities: no edema or cyanosis. Warm, well perfused.   bilaterally Skin: warm and dry,     Neuro: alert and oriented, no focal deficits   Assessment & Plan:    Hearing loss Unclear etiology at this time. Can consider sensorineural however hearing loss is bilateral. No recent viral illness. No recent trauma. No ear wax on exam. Could visualize TM bilaterally. Can consider presbyacusis. Given worsening hearing loss will plan to refer  to ENT. Strict return precautions given.   Rash Unclear etiology at this time. Can consider contact dermatitis worsened possibly by voltaren gel. Dr. Erin Hearing also came and examined rash. Will recommend triamcinolone 0.1% ointment to be used PRN. Advised to follow up in 1 month, sooner if no improvement.     Return in about 4 weeks (around 05/28/2019).   Caroline More, DO, PGY-3

## 2019-05-01 DIAGNOSIS — H9190 Unspecified hearing loss, unspecified ear: Secondary | ICD-10-CM | POA: Insufficient documentation

## 2019-05-01 NOTE — Assessment & Plan Note (Addendum)
Unclear etiology at this time. Can consider sensorineural however hearing loss is bilateral. No recent viral illness. No recent trauma. No ear wax on exam. Could visualize TM bilaterally. Can consider presbyacusis. Given worsening hearing loss will plan to refer to ENT. Strict return precautions given.

## 2019-05-01 NOTE — Assessment & Plan Note (Addendum)
Unclear etiology at this time. Can consider contact dermatitis worsened possibly by voltaren gel. Dr. Erin Hearing also came and examined rash. Will recommend triamcinolone 0.1% ointment to be used PRN. Advised to follow up in 1 month, sooner if no improvement.

## 2019-05-03 ENCOUNTER — Other Ambulatory Visit: Payer: Self-pay | Admitting: Adult Health

## 2019-05-03 ENCOUNTER — Other Ambulatory Visit: Payer: Self-pay | Admitting: Family Medicine

## 2019-05-03 ENCOUNTER — Ambulatory Visit: Payer: Medicare Other | Admitting: Adult Health

## 2019-05-03 DIAGNOSIS — D169 Benign neoplasm of bone and articular cartilage, unspecified: Secondary | ICD-10-CM

## 2019-05-03 DIAGNOSIS — G894 Chronic pain syndrome: Secondary | ICD-10-CM

## 2019-05-03 DIAGNOSIS — G8929 Other chronic pain: Secondary | ICD-10-CM

## 2019-05-03 DIAGNOSIS — M5440 Lumbago with sciatica, unspecified side: Secondary | ICD-10-CM

## 2019-05-04 ENCOUNTER — Other Ambulatory Visit: Payer: Self-pay | Admitting: *Deleted

## 2019-05-04 ENCOUNTER — Telehealth: Payer: Self-pay | Admitting: *Deleted

## 2019-05-04 DIAGNOSIS — M25562 Pain in left knee: Secondary | ICD-10-CM

## 2019-05-04 DIAGNOSIS — M5440 Lumbago with sciatica, unspecified side: Secondary | ICD-10-CM

## 2019-05-04 DIAGNOSIS — D169 Benign neoplasm of bone and articular cartilage, unspecified: Secondary | ICD-10-CM

## 2019-05-04 DIAGNOSIS — G8929 Other chronic pain: Secondary | ICD-10-CM

## 2019-05-04 DIAGNOSIS — G894 Chronic pain syndrome: Secondary | ICD-10-CM

## 2019-05-04 NOTE — Addendum Note (Signed)
Addended by: Christen Bame D on: 05/04/2019 09:13 AM   Modules accepted: Orders

## 2019-05-04 NOTE — Telephone Encounter (Signed)
Please have patient schedule follow up if rash is worsening. Can be seen in ATC to get in sooner  Caroline More, DO, PGY-3 [facility] 05/04/2019 2:32 PM

## 2019-05-04 NOTE — Telephone Encounter (Signed)
Pt states that his leg rash is getting worse.  He wants to know what should happen next. Christen Bame, CMA

## 2019-05-06 ENCOUNTER — Other Ambulatory Visit: Payer: Self-pay

## 2019-05-06 ENCOUNTER — Ambulatory Visit (INDEPENDENT_AMBULATORY_CARE_PROVIDER_SITE_OTHER): Payer: Medicare Other | Admitting: Family Medicine

## 2019-05-06 VITALS — BP 110/70 | HR 60

## 2019-05-06 DIAGNOSIS — R21 Rash and other nonspecific skin eruption: Secondary | ICD-10-CM | POA: Diagnosis not present

## 2019-05-06 MED ORDER — PREDNISONE 20 MG PO TABS: 20.0000 mg | ORAL_TABLET | Freq: Every day | ORAL | 0 refills | Status: AC

## 2019-05-06 NOTE — Progress Notes (Signed)
   Subjective:    Patient ID: Richard Glass, male    DOB: 1970/10/01, 48 y.o.   MRN: [id number]   CC: Rash  HPI: Rash Presents for follow-up of rash.  Was seen on 8/14 by me and Dr. Josiah Lobo for rash.  At that time was prescribed triamcinolone ointment possible contact dermatitis.  Patient reports that rash is not improving and is in fact spreading.  Is now spreading all the way to his shoulder and now somewhat on his right arm as well.  Otherwise is all over his left side.  Denies any fevers.  Has no change in cosmetics.  Does work outdoors some but no new exposure..  Has a pet dog but dog does not have any fleas.  Reports that area is very itchy and is bothering him.  Objective:  BP 110/70   Pulse 60   SpO2 100%  Vitals and nursing note reviewed  General: well nourished, in no acute distress Cardio: regular rate Resp: speaking full sentences, no increased WOB HEENT: normocephaliSkin: warm and dry     Neuro: alert and oriented, no focal deficits   Assessment & Plan:    Rash Rash seems to be worsening despite using triamcinolone cream.  Will give trial of prednisone 20 mg x 5 days to help improve some inflammation and itching.  Advised to continue triamcinolone if this is helping even a little bit.  Strict return precautions given.  We will plan to refer to dermatology for further work-up and diagnosis.  Advised patient that if rash does not improve in 2 weeks to please follow-up.  If worsening follow-up sooner.    Return in about 2 weeks (around 05/20/2019), or if symptoms worsen or fail to improve.   Caroline More, DO, PGY-3

## 2019-05-06 NOTE — Patient Instructions (Signed)
Please make sure to keep the area clean and dry.  You can use topical lotions to help calm down some of the irritation.  Please try not take very hot showers as this will irritate the area.  Pat the area dry instead of rubbing it with a towel.  You can continue to use triamcinolone ointment.  I have prescribed you prednisone x5 days.  If symptoms do not improve please come back in 2 weeks.  If they worsen come back sooner.  I have sent in a referral to dermatology.  If you do not hear back within the next 1 to 2 weeks please let us know so we can help schedule this appointment with dermatology.  Please switch all soaps, detergents, shampoos, and other products to sensitive or unscented forms.  Dr. Tammi Klippel

## 2019-05-06 NOTE — Assessment & Plan Note (Signed)
Rash seems to be worsening despite using triamcinolone cream.  Will give trial of prednisone 20 mg x 5 days to help improve some inflammation and itching.  Advised to continue triamcinolone if this is helping even a little bit.  Strict return precautions given.  We will plan to refer to dermatology for further work-up and diagnosis.  Advised patient that if rash does not improve in 2 weeks to please follow-up.  If worsening follow-up sooner.

## 2019-05-20 ENCOUNTER — Encounter: Payer: Self-pay | Admitting: Family Medicine

## 2019-05-20 ENCOUNTER — Ambulatory Visit (INDEPENDENT_AMBULATORY_CARE_PROVIDER_SITE_OTHER): Payer: Medicare Other | Admitting: Family Medicine

## 2019-05-20 ENCOUNTER — Other Ambulatory Visit: Payer: Self-pay

## 2019-05-20 VITALS — BP 118/70 | HR 68

## 2019-05-20 DIAGNOSIS — G8929 Other chronic pain: Secondary | ICD-10-CM

## 2019-05-20 DIAGNOSIS — L281 Prurigo nodularis: Secondary | ICD-10-CM | POA: Diagnosis not present

## 2019-05-20 DIAGNOSIS — L300 Nummular dermatitis: Secondary | ICD-10-CM

## 2019-05-20 DIAGNOSIS — M25562 Pain in left knee: Secondary | ICD-10-CM | POA: Diagnosis not present

## 2019-05-20 DIAGNOSIS — M79605 Pain in left leg: Secondary | ICD-10-CM

## 2019-05-20 DIAGNOSIS — L03114 Cellulitis of left upper limb: Secondary | ICD-10-CM

## 2019-05-20 DIAGNOSIS — S40812A Abrasion of left upper arm, initial encounter: Secondary | ICD-10-CM

## 2019-05-20 DIAGNOSIS — M5440 Lumbago with sciatica, unspecified side: Secondary | ICD-10-CM

## 2019-05-20 DIAGNOSIS — D169 Benign neoplasm of bone and articular cartilage, unspecified: Secondary | ICD-10-CM

## 2019-05-20 DIAGNOSIS — L239 Allergic contact dermatitis, unspecified cause: Secondary | ICD-10-CM

## 2019-05-20 DIAGNOSIS — G894 Chronic pain syndrome: Secondary | ICD-10-CM

## 2019-05-20 DIAGNOSIS — L259 Unspecified contact dermatitis, unspecified cause: Secondary | ICD-10-CM

## 2019-05-20 MED ORDER — METHYLPREDNISOLONE SODIUM SUCC 125 MG IJ SOLR
125.0000 mg | Freq: Once | INTRAMUSCULAR | Status: AC
  Administered 2019-05-20: 125 mg via INTRAMUSCULAR

## 2019-05-20 MED ORDER — CAMPHOR-MENTHOL 0.5-0.5 % EX LOTN: 1.0000 "application " | TOPICAL_LOTION | CUTANEOUS | 0 refills | Status: DC | PRN

## 2019-05-20 MED ORDER — HYDROCODONE-ACETAMINOPHEN 7.5-325 MG PO TABS: 1.0000 | ORAL_TABLET | Freq: Four times a day (QID) | ORAL | 0 refills | Status: DC | PRN

## 2019-05-20 MED ORDER — CEPHALEXIN 500 MG PO CAPS: 500.0000 mg | ORAL_CAPSULE | Freq: Four times a day (QID) | ORAL | 0 refills | Status: AC

## 2019-05-20 MED ORDER — HYDROXYZINE HCL 10 MG PO TABS: 20.0000 mg | ORAL_TABLET | Freq: Three times a day (TID) | ORAL | 1 refills | Status: AC | PRN

## 2019-05-20 MED ORDER — PREDNISONE 10 MG PO TABS: 30.0000 mg | ORAL_TABLET | Freq: Every day | ORAL | 1 refills | Status: AC

## 2019-05-20 NOTE — Patient Instructions (Addendum)
I think your itching rash started as an allergy to something that touched your left arm, like poison ivy or some other thing.  I think that your continued scratching of the rash is making the itching rash stay and not go away with medicines.  I think your skin has become infected from your scratching.   I want you to to:  1) Start taking Prednisone 3 tablets each morning for 10 days   2) Start Cephalexin (antibiotic), 1 tablet four times a day for 5 days   3) Start Hydroxyzine to help with itching, take 2 tablets every 8 hours if you need it for itching.  This medicine will make you sleepy, so you may want to just take it at bedtime.    4) Use Camphor-menthol (Sarna) lotion to rub on skin whenever you want to scratch. Rub the skin when it itches, DO NOT SCRATCH the skin!!!   5) Run your left arm under hot shower water to help calm itching when it is really bad.  Make sure to rewrap your arm after you pat it dray and rub it with the lotion.    6) Keep your arm wrapped with gauze to protect the skin until the sores have healed over.      7) If the itching rash returns after you stop the Prednisone, refill the Prednisone, then restart another 10 days of daily Prednisone, 3 tablets daily.

## 2019-05-21 ENCOUNTER — Encounter: Payer: Self-pay | Admitting: Family Medicine

## 2019-05-21 MED ORDER — HYDROCODONE-ACETAMINOPHEN 7.5-325 MG PO TABS: 1.0000 | ORAL_TABLET | Freq: Four times a day (QID) | ORAL | 0 refills | Status: AC | PRN

## 2019-05-21 NOTE — Progress Notes (Signed)
Subjective:    Patient ID: Richard Glass, male    DOB: 1971-06-15, 48 y.o.   MRN: [id number] Richard Glass is alone Sources of clinical information for visit is/are patient and past medical records. Nursing assessment for this office visit was reviewed with the patient for accuracy and revision.   Previous Report(s) Reviewed: historical medical records and photos  Depression screen [facility] 2/9 05/20/2019  Decreased Interest 0  Down, Depressed, Hopeless 0  PHQ - 2 Score 0  Altered sleeping -  Tired, decreased energy -  Change in appetite -  Feeling bad or failure about yourself  -  Trouble concentrating -  Moving slowly or fidgety/restless -  Suicidal thoughts -  PHQ-9 Score -  Some recent data might be hidden   Fall Risk  04/30/2019 11/19/2018 10/06/2018 07/08/2017 05/01/2017  Falls in the past year? 1 1 1  No Yes  Comment - - - - -  Number falls in past yr: 0 1 1 - 1  Injury with Fall? - 0 0 - No  Comment - - soreness - -  Risk for fall due to : - - Other (Comment) - -  Risk for fall due to: Comment - - left leg giving out on me - -  Some encounter information is confidential and restricted. Go to Review Flowsheets activity to see all data.   Adult vaccines due  Topic Date Due  . TETANUS/TDAP  11/21/2027   Health Maintenance Due  Topic Date Due  . COLONOSCOPY  02/08/2019    History/P.E. 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 TETANUS/TDAP  11/21/2027   There are no preventive care reminders to display for this patient.  Health Maintenance Due  Topic Date Due  . COLONOSCOPY  02/08/2019     Chief Complaint  Patient presents with  . Medication Refill  . Pruritis     HPI  Rash - Ongoing acute complaint.  - 04/20/19 First seen at [facility]  for bilateral upper arm, head, back of neck. both painful and itching. Working dx of sunburn. Rx voltaren gel.   - Seen 04/30/19 at [facility] with complaint of just left arm pruritic rash left  arm, left side of neck and left leg. See photos. Working dx of contact dermatitis worsened by appliction Voltaren gel.  Rx triamcinolone 0.1% ointment. FU 1 month.   - 05/06/19 [place] c/o same pruritic rash left arm, possibly right arm. Rash photos show sites of freah excoriation and shiny skin surrounding. Linear abrasions.  Pt reports using Rx triamcinolone.  No specific diagnosis given. Rx prednisone 20 mg daily x 5 day. Cont. Steroid oint. Dermatology referral made.    Today - Continues intense itching but just of left arm. - Reports itching subsided for the time he was on prednisone oral.  Reports continues to apply triamcinolone but says it does not good.  - Scratching self until bleeding. Interfereing with sleep.  - No fever/chills.   Chronic Pain syndrome - No change in left knee pain. Burning quality. Taking Gabapentin 1200 mg TIS. No falls nor confusion.  Taking Hydrocodone 7.5/325 TID prn.  Not taking duloxeting started 11/19/18.  - Rx Alprzolam from [place] Dr Jannifer Franklin (Neuro) for tremors. Pt also on Phenobarb for szs.   [id number] status reviewed    Review of Systems No cough No SOB    Objective:   Physical Exam  PHYSICAL EXAM  There were no vitals filed for this  visit.  VS reviewed GEN: Alert, Cooperative, Groomed, NAD  SKIN:Multiple excoriations with accompanying linear abrasions and shined skin. Fresh oozing blood from multiple excoriation sites. Location is extensor surface or left proximal and distal arm. Increased warth left arm around excoriation/abrased skin compared to right.  Mild tenderness of left arm extensor surface.    No left axillary LAN Psych: Normal affect/thought/speech/language    Assessment & Plan:

## 2019-05-21 NOTE — Assessment & Plan Note (Signed)
Acute exacerbation of a recurrent issue for patient. Unknow precipitant(s) Possible irritant dermatitis component from use of topical NSAID  Suspect superinfection Excoriations are well into dermis layer.   Plan Rx Prednisone 30 mg daily for 10 days.  If itching returns with stop of prednisone, there is an addition 10 day refill that should be started.  Rx Keflex 500 mg PO QID x 5 days Topical Sarna when desire to itch cannot be stopped.  Rub, not scratch.  Follow up appointment with Dermatology in next two weeks.  Will send to Allergist to see if the offending agents can be identified an avoided.  Wound care with nonstick pads and gauze wraps

## 2019-05-21 NOTE — Assessment & Plan Note (Signed)
Established problem Controlled Review of [place] showed no evidence of doctor shopping.  Benzo and Phenobarb  prescribed by patient's neurologist. Continue current therapy regiment. 2 monthly Refill Norco5/325 to fill mid- September and mid-October

## 2019-06-01 ENCOUNTER — Other Ambulatory Visit: Payer: Self-pay | Admitting: Neurology

## 2019-06-01 ENCOUNTER — Other Ambulatory Visit: Payer: Self-pay | Admitting: Adult Health

## 2019-06-07 ENCOUNTER — Other Ambulatory Visit: Payer: Self-pay

## 2019-06-07 ENCOUNTER — Encounter: Payer: Self-pay | Admitting: Family Medicine

## 2019-06-07 ENCOUNTER — Ambulatory Visit (INDEPENDENT_AMBULATORY_CARE_PROVIDER_SITE_OTHER): Payer: Medicare Other | Admitting: Family Medicine

## 2019-06-07 VITALS — BP 112/72 | HR 54 | Wt 154.2 lb

## 2019-06-07 DIAGNOSIS — M545 Low back pain, unspecified: Secondary | ICD-10-CM

## 2019-06-07 DIAGNOSIS — R300 Dysuria: Secondary | ICD-10-CM | POA: Diagnosis not present

## 2019-06-07 LAB — POCT URINALYSIS DIP (MANUAL ENTRY)
Bilirubin, UA: NEGATIVE
Blood, UA: NEGATIVE
Glucose, UA: NEGATIVE mg/dL
Ketones, POC UA: NEGATIVE mg/dL
Leukocytes, UA: NEGATIVE
Nitrite, UA: NEGATIVE
Protein Ur, POC: NEGATIVE mg/dL
Spec Grav, UA: 1.01 (ref 1.010–1.025)
Urobilinogen, UA: 0.2 E.U./dL
pH, UA: 6 (ref 5.0–8.0)

## 2019-06-09 LAB — URINE CULTURE

## 2019-06-09 NOTE — Assessment & Plan Note (Signed)
Patient with complaint of back pain that he is wondering if is related to his kidneys given that he also says he has "tingling urine ".  No actual dysuria, no hematuria, no change in frequency.  On physical exam there is no characteristic flank pain, he is had no fevers.  There is specific tenderness to the paraspinal musculature of the lumbar back which is where he says it hurts the worst.  No penile pain.  Likely acute exacerbation of chronic established back pain.  He is already had imaging of this and has had no recent trauma so will not obtain new imaging.  UA and urine culture were negative so no indication for antibiotics at this time and do not suspect kidney stones.

## 2019-06-09 NOTE — Progress Notes (Signed)
    Subjective:  Richard Glass is a 48 y.o. male who presents to the [facility] today with a chief complaint of back pain and tingling urination.   HPI: Low back pain Patient with complaint of back pain that has been increased above baseline for the last 2 days but has been stable since then.  He is wondering if is related to his kidneys given that he also says he has "tingling urine ".  No actual dysuria, no hematuria, no change in frequency.  On physical exam there is no characteristic flank pain, he is had no fevers.  He points to the middle of his lumbar spine as the source of the pain.  no penile pain.  No recent falls or trauma  Objective:  Physical Exam: BP 112/72   Pulse (!) 54   Wt 154 lb 3.2 oz (69.9 kg)   SpO2 100%   BMI 22.77 kg/m   Gen: NAD, very uncomfortable but distractible, patient had requested a wheelchair on the way in because he said his back hurt so bad but was able to stand and ambulate, frail CV: Minimally bradycardic to mid 50s but not symptomatic  Pulm: NWOB, CTAB with no crackles, wheezes, or rhonchi GI: Normal bowel sounds present. Soft, Nontender, Nondistended. MSK: no edema, cyanosis, or clubbing noted Skin: warm, dry Neuro: grossly normal, moves all extremities Psych: Normal affect and thought content  Results for orders placed or performed in visit on 06/07/19 (from the past 72 hour(s))  POCT urinalysis dipstick     Status: None   Collection Time: 06/07/19  4:24 PM  Result Value Ref Range   Color, UA yellow yellow   Clarity, UA clear clear   Glucose, UA negative negative mg/dL   Bilirubin, UA negative negative   Ketones, POC UA negative negative mg/dL   Spec Grav, UA 1.010 1.010 - 1.025   Blood, UA negative negative   pH, UA 6.0 5.0 - 8.0   Protein Ur, POC negative negative mg/dL   Urobilinogen, UA 0.2 0.2 or 1.0 E.U./dL   Nitrite, UA Negative Negative   Leukocytes, UA Negative Negative  Urine Culture     Status: None   Collection Time: 06/07/19   4:27 PM   Specimen: Urine   UC  Result Value Ref Range   Urine Culture, Routine Final report    Organism ID, Bacteria Comment     Comment: Culture shows less than 10,000 colony forming units of bacteria per milliliter of urine. This colony count is not generally considered to be clinically significant.      Assessment/Plan:  Low back pain Patient with complaint of back pain that he is wondering if is related to his kidneys given that he also says he has "tingling urine ".  No actual dysuria, no hematuria, no change in frequency.  On physical exam there is no characteristic flank pain, he is had no fevers.  There is specific tenderness to the paraspinal musculature of the lumbar back which is where he says it hurts the worst.  No penile pain.  Likely acute exacerbation of chronic established back pain.  He is already had imaging of this and has had no recent trauma so will not obtain new imaging.  UA and urine culture were negative so no indication for antibiotics at this time and do not suspect kidney stones.   Sherene Sires, DO FAMILY MEDICINE RESIDENT - PGY3 06/09/2019 9:41 AM

## 2019-06-23 ENCOUNTER — Encounter (HOSPITAL_COMMUNITY): Payer: Self-pay | Admitting: Emergency Medicine

## 2019-06-23 ENCOUNTER — Emergency Department (HOSPITAL_COMMUNITY)
Admission: EM | Admit: 2019-06-23 | Discharge: 2019-06-24 | Disposition: A | Discharge status: Home / Self Care | Payer: Medicare Other | Source: Home / Self Care | Attending: Emergency Medicine | Admitting: Emergency Medicine

## 2019-06-23 ENCOUNTER — Other Ambulatory Visit: Payer: Self-pay

## 2019-06-23 DIAGNOSIS — K219 Gastro-esophageal reflux disease without esophagitis: Secondary | ICD-10-CM | POA: Diagnosis not present

## 2019-06-23 DIAGNOSIS — S299XXA Unspecified injury of thorax, initial encounter: Secondary | ICD-10-CM | POA: Diagnosis not present

## 2019-06-23 DIAGNOSIS — W19XXXA Unspecified fall, initial encounter: Secondary | ICD-10-CM

## 2019-06-23 DIAGNOSIS — G9389 Other specified disorders of brain: Secondary | ICD-10-CM | POA: Diagnosis not present

## 2019-06-23 DIAGNOSIS — S72001A Fracture of unspecified part of neck of right femur, initial encounter for closed fracture: Secondary | ICD-10-CM | POA: Diagnosis not present

## 2019-06-23 DIAGNOSIS — S8991XA Unspecified injury of right lower leg, initial encounter: Secondary | ICD-10-CM | POA: Diagnosis not present

## 2019-06-23 DIAGNOSIS — S3992XA Unspecified injury of lower back, initial encounter: Secondary | ICD-10-CM | POA: Diagnosis not present

## 2019-06-23 DIAGNOSIS — G894 Chronic pain syndrome: Secondary | ICD-10-CM | POA: Diagnosis not present

## 2019-06-23 DIAGNOSIS — M79604 Pain in right leg: Secondary | ICD-10-CM | POA: Diagnosis not present

## 2019-06-23 DIAGNOSIS — S99911A Unspecified injury of right ankle, initial encounter: Secondary | ICD-10-CM | POA: Diagnosis not present

## 2019-06-23 DIAGNOSIS — E785 Hyperlipidemia, unspecified: Secondary | ICD-10-CM | POA: Diagnosis not present

## 2019-06-23 DIAGNOSIS — M8580 Other specified disorders of bone density and structure, unspecified site: Secondary | ICD-10-CM | POA: Diagnosis not present

## 2019-06-23 DIAGNOSIS — Y999 Unspecified external cause status: Secondary | ICD-10-CM | POA: Insufficient documentation

## 2019-06-23 DIAGNOSIS — M79661 Pain in right lower leg: Secondary | ICD-10-CM | POA: Diagnosis not present

## 2019-06-23 DIAGNOSIS — Z79899 Other long term (current) drug therapy: Secondary | ICD-10-CM | POA: Insufficient documentation

## 2019-06-23 DIAGNOSIS — S06369S Traumatic hemorrhage of cerebrum, unspecified, with loss of consciousness of unspecified duration, sequela: Secondary | ICD-10-CM | POA: Diagnosis not present

## 2019-06-23 DIAGNOSIS — S4991XA Unspecified injury of right shoulder and upper arm, initial encounter: Secondary | ICD-10-CM | POA: Diagnosis not present

## 2019-06-23 DIAGNOSIS — Z981 Arthrodesis status: Secondary | ICD-10-CM | POA: Diagnosis not present

## 2019-06-23 DIAGNOSIS — J45909 Unspecified asthma, uncomplicated: Secondary | ICD-10-CM | POA: Diagnosis not present

## 2019-06-23 DIAGNOSIS — J4520 Mild intermittent asthma, uncomplicated: Secondary | ICD-10-CM | POA: Diagnosis not present

## 2019-06-23 DIAGNOSIS — W11XXXA Fall on and from ladder, initial encounter: Secondary | ICD-10-CM | POA: Insufficient documentation

## 2019-06-23 DIAGNOSIS — M25561 Pain in right knee: Secondary | ICD-10-CM | POA: Insufficient documentation

## 2019-06-23 DIAGNOSIS — M25511 Pain in right shoulder: Secondary | ICD-10-CM | POA: Insufficient documentation

## 2019-06-23 DIAGNOSIS — M25571 Pain in right ankle and joints of right foot: Secondary | ICD-10-CM | POA: Diagnosis not present

## 2019-06-23 DIAGNOSIS — Z8249 Family history of ischemic heart disease and other diseases of the circulatory system: Secondary | ICD-10-CM | POA: Diagnosis not present

## 2019-06-23 DIAGNOSIS — Z791 Long term (current) use of non-steroidal anti-inflammatories (NSAID): Secondary | ICD-10-CM | POA: Diagnosis not present

## 2019-06-23 DIAGNOSIS — R001 Bradycardia, unspecified: Secondary | ICD-10-CM | POA: Diagnosis not present

## 2019-06-23 DIAGNOSIS — Z87891 Personal history of nicotine dependence: Secondary | ICD-10-CM | POA: Insufficient documentation

## 2019-06-23 DIAGNOSIS — Y9389 Activity, other specified: Secondary | ICD-10-CM | POA: Insufficient documentation

## 2019-06-23 DIAGNOSIS — R509 Fever, unspecified: Secondary | ICD-10-CM | POA: Insufficient documentation

## 2019-06-23 DIAGNOSIS — M545 Low back pain: Secondary | ICD-10-CM | POA: Diagnosis not present

## 2019-06-23 DIAGNOSIS — Y929 Unspecified place or not applicable: Secondary | ICD-10-CM | POA: Insufficient documentation

## 2019-06-23 DIAGNOSIS — I4510 Unspecified right bundle-branch block: Secondary | ICD-10-CM | POA: Diagnosis not present

## 2019-06-23 DIAGNOSIS — G2581 Restless legs syndrome: Secondary | ICD-10-CM | POA: Diagnosis not present

## 2019-06-23 DIAGNOSIS — Z809 Family history of malignant neoplasm, unspecified: Secondary | ICD-10-CM | POA: Diagnosis not present

## 2019-06-23 DIAGNOSIS — G8194 Hemiplegia, unspecified affecting left nondominant side: Secondary | ICD-10-CM | POA: Diagnosis not present

## 2019-06-23 DIAGNOSIS — I959 Hypotension, unspecified: Secondary | ICD-10-CM | POA: Diagnosis not present

## 2019-06-23 DIAGNOSIS — I499 Cardiac arrhythmia, unspecified: Secondary | ICD-10-CM | POA: Diagnosis not present

## 2019-06-23 DIAGNOSIS — S72011A Unspecified intracapsular fracture of right femur, initial encounter for closed fracture: Secondary | ICD-10-CM | POA: Diagnosis not present

## 2019-06-23 DIAGNOSIS — Z20828 Contact with and (suspected) exposure to other viral communicable diseases: Secondary | ICD-10-CM | POA: Insufficient documentation

## 2019-06-24 ENCOUNTER — Other Ambulatory Visit: Payer: Self-pay

## 2019-06-24 ENCOUNTER — Telehealth: Payer: Self-pay | Admitting: *Deleted

## 2019-06-24 ENCOUNTER — Emergency Department (HOSPITAL_COMMUNITY): Payer: Medicare Other

## 2019-06-24 LAB — BASIC METABOLIC PANEL
Anion gap: 10 (ref 5–15)
BUN: 9 mg/dL (ref 6–20)
CO2: 20 mmol/L — ABNORMAL LOW (ref 22–32)
Calcium: 8.2 mg/dL — ABNORMAL LOW (ref 8.9–10.3)
Chloride: 107 mmol/L (ref 98–111)
Creatinine, Ser: 0.88 mg/dL (ref 0.61–1.24)
GFR calc Af Amer: >60 mL/min (ref >60)
GFR calc non Af Amer: >60 mL/min (ref >60)
Glucose, Bld: 111 mg/dL — ABNORMAL HIGH (ref 70–99)
Potassium: 3.2 mmol/L — ABNORMAL LOW (ref 3.5–5.1)
Sodium: 137 mmol/L (ref 135–145)

## 2019-06-24 LAB — CBC WITH DIFFERENTIAL/PLATELET
Abs Immature Granulocytes: 0.04 10*3/uL (ref 0.00–0.07)
Basophils Absolute: 0 10*3/uL (ref 0.0–0.1)
Basophils Relative: 0 %
Eosinophils Absolute: 0 10*3/uL (ref 0.0–0.5)
Eosinophils Relative: 0 %
HCT: 36 % — ABNORMAL LOW (ref 39.0–52.0)
Hemoglobin: 12.1 g/dL — ABNORMAL LOW (ref 13.0–17.0)
Immature Granulocytes: 0 %
Lymphocytes Relative: 11 %
Lymphs Abs: 1.1 10*3/uL (ref 0.7–4.0)
MCH: 31.7 pg (ref 26.0–34.0)
MCHC: 33.6 g/dL (ref 30.0–36.0)
MCV: 94.2 fL (ref 80.0–100.0)
Monocytes Absolute: 0.8 10*3/uL (ref 0.1–1.0)
Monocytes Relative: 8 %
Neutro Abs: 8 10*3/uL — ABNORMAL HIGH (ref 1.7–7.7)
Neutrophils Relative %: 81 %
Platelets: 177 10*3/uL (ref 150–400)
RBC: 3.82 MIL/uL — ABNORMAL LOW (ref 4.22–5.81)
RDW: 12.7 % (ref 11.5–15.5)
WBC: 9.9 10*3/uL (ref 4.0–10.5)
nRBC: 0 % (ref 0.0–0.2)

## 2019-06-24 MED ORDER — KETOROLAC TROMETHAMINE 15 MG/ML IJ SOLN
15.0000 mg | Freq: Once | INTRAMUSCULAR | Status: AC
  Administered 2019-06-24: 15 mg via INTRAVENOUS
  Filled 2019-06-24: qty 1

## 2019-06-24 MED ORDER — METHOCARBAMOL 1000 MG/10ML IJ SOLN
500.0000 mg | Freq: Once | INTRAVENOUS | Status: AC
  Administered 2019-06-24: 500 mg via INTRAVENOUS
  Filled 2019-06-24: qty 5

## 2019-06-24 MED ORDER — LIDOCAINE 5 % EX PTCH
1.0000 | MEDICATED_PATCH | CUTANEOUS | Status: DC
  Administered 2019-06-24: 1 via TRANSDERMAL
  Filled 2019-06-24: qty 1

## 2019-06-24 MED ORDER — METHOCARBAMOL 1000 MG/10ML IJ SOLN
500.0000 mg | Freq: Once | INTRAMUSCULAR | Status: DC
  Filled 2019-06-24: qty 5

## 2019-06-24 MED ORDER — SODIUM CHLORIDE 0.9 % IV BOLUS
500.0000 mL | Freq: Once | INTRAVENOUS | Status: AC
  Administered 2019-06-24: 500 mL via INTRAVENOUS

## 2019-06-24 NOTE — Telephone Encounter (Signed)
Peter Congo called for pt, states that she is a patient advocate for him.  She states that she told him to "get some labs about his rash.  He said he did but never got results".  We are unable to tell Peter Congo anything as there is no DPR for her.  I did attempt to call pt back but had to leave a message.  We have not drawn any labs since December on him.   Will await callback. Christen Bame, CMA

## 2019-06-24 NOTE — ED Triage Notes (Signed)
Patient lost his balance and fell from a ladder approx. 6 ' high , denies LOC /ambulatory , reports right knee pain and right shoulder joint pain . He received Fentanyl 50 mcg and NS 500 ml IV by EMS prior to arrival .

## 2019-06-24 NOTE — ED Notes (Signed)
Patient transported to X-ray 
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
## 2019-06-24 NOTE — Discharge Instructions (Addendum)
Continue taking your home medications as prescribed.  You were tested for coronavirus today due to fever.  You should quarantine at home until results return.  Follow up with the orthopedic doctor for further evaluation of your shoulder and knee.  Return to the ER with any new, worsening, or concerning symptoms.

## 2019-06-25 ENCOUNTER — Other Ambulatory Visit: Payer: Self-pay

## 2019-06-25 ENCOUNTER — Other Ambulatory Visit: Payer: Self-pay | Admitting: Family Medicine

## 2019-06-25 ENCOUNTER — Ambulatory Visit (HOSPITAL_COMMUNITY)
Admission: RE | Admit: 2019-06-25 | Discharge: 2019-06-25 | Disposition: A | Discharge status: Home / Self Care | Payer: Medicare Other | Source: Ambulatory Visit | Attending: Family Medicine | Admitting: Family Medicine

## 2019-06-25 ENCOUNTER — Ambulatory Visit (INDEPENDENT_AMBULATORY_CARE_PROVIDER_SITE_OTHER): Payer: Medicare Other | Admitting: Family Medicine

## 2019-06-25 VITALS — BP 122/65 | HR 100 | Temp 99.4°F

## 2019-06-25 DIAGNOSIS — M79604 Pain in right leg: Secondary | ICD-10-CM | POA: Diagnosis not present

## 2019-06-25 DIAGNOSIS — S99911A Unspecified injury of right ankle, initial encounter: Secondary | ICD-10-CM | POA: Diagnosis not present

## 2019-06-25 DIAGNOSIS — M25571 Pain in right ankle and joints of right foot: Secondary | ICD-10-CM | POA: Diagnosis not present

## 2019-06-25 DIAGNOSIS — M79661 Pain in right lower leg: Secondary | ICD-10-CM | POA: Diagnosis not present

## 2019-06-25 DIAGNOSIS — S72001A Fracture of unspecified part of neck of right femur, initial encounter for closed fracture: Secondary | ICD-10-CM | POA: Diagnosis not present

## 2019-06-25 DIAGNOSIS — S72011A Unspecified intracapsular fracture of right femur, initial encounter for closed fracture: Secondary | ICD-10-CM | POA: Diagnosis not present

## 2019-06-25 LAB — NOVEL CORONAVIRUS, NAA (HOSP ORDER, SEND-OUT TO REF LAB; TAT 18-24 HRS): SARS-CoV-2, NAA: NOT DETECTED

## 2019-06-25 MED ORDER — MELOXICAM 15 MG PO TABS: 15.0000 mg | ORAL_TABLET | Freq: Every day | ORAL | 0 refills | Status: DC

## 2019-06-25 NOTE — Patient Instructions (Signed)
Things we did today:  -I have prescribed meloxicam for pain.  Take this once a day. - I have sent a referral for orthopedics, someone will call you to schedule that. -I have sent in a referral for home health physical therapy and home health aide, someone will call you tomorrow to schedule that. - I have ordered x-rays of his right leg.  You can get them done at [facility].  She had any questions or concerns, please call back to our office at [place], [place]

## 2019-06-25 NOTE — Telephone Encounter (Signed)
Advocate calls back.  I was able to reach pt and confirm that he is ok with me speaking with her.  She is concerned about bloodclots in his legs.  States that he is in "excrutiating pain" and she feels that did not examine him completely in the hospital.  Appt made for this afternoon @ 2:20.  Negatvie covid test. Christen Bame, CMA

## 2019-06-25 NOTE — Telephone Encounter (Signed)
Please contact this patient advocate to find out the reason they believe pt needs labs.  Are there specific labs they are recommending to pt?  Thank you  Sherren Mocha

## 2019-06-25 NOTE — Progress Notes (Signed)
   [facility] Phone: [phone]     Richard Glass - 48 y.o. male MRN [id number]  Date of birth: November 09, 1970  Subjective:   cc: fall, leg pain  HPI:  Fall:  [name] off a ladder Wednesday, went to ED after it happened. Stayed in ED until morning. States he was told he was 'good to go' and sent home.  States he was not given information on followups with orthopedist or given pain medication.  No orthopedic doctor saw him in the hospital.  He states he currently cannot put weight on his right leg.  States pain is from thigh down to his foot.  Also has pain in his right shoulder, but not humerus, elbow or forearm. Takes norco four to five times a day for chronic pain. Takes baclofen as well. Also on gabapentin for neuropathic pain. Denies hx of heart/kidney disease, GI bleed.   ROS: See HPI for pertinent positives and negatives  Past Medical History significant for chronic pain syndrome  Family history reviewed for today's visit. No changes.   -  reports that he quit smoking about 7 years ago. His smoking use included cigarettes. He has a 22.00 pack-year smoking history. His smokeless tobacco use includes snuff.  Objective:   BP 122/65   Pulse 100   Temp 99.4 F (37.4 C) (Oral)   SpO2 95%  Gen: in wheelchair, soft cast on right leg, right arm in sling. Moderate amount of pain Msk: TTP of right leg on hip, thigh, tibia, and ankle.  TTP at right [facility] joint, but not humerus, elbow, forearm, or wrist on right UE. 5/5 grip strength b/l.  Neuro: sensation and movement intact in all four extremities.   Skin: No rashes, no lesions Psych: Appropriate behavior  Assessment/Plan:   Right leg pain Significant pain and inability to bear weight on RLE after fall from ladder two days ago.  Imaging in ED did not show fracture although this was only tib/fib view.  Will get additional imaging to look for fractures of femur.  Patient already on significant amount of pain medication  for chronic pain.  Pt denies hx of heart disease/kidney disease/gi bleed.   - 15mg  meloxicam daily, 30 tabs - home health PT and nurse aide - amb ref to orthopedics - xray ankle and femur - f/u 4-6 weeks  Clemetine Marker, MD PGY-2 [facility]

## 2019-06-26 ENCOUNTER — Inpatient Hospital Stay (HOSPITAL_COMMUNITY): Payer: Medicare Other

## 2019-06-26 ENCOUNTER — Inpatient Hospital Stay (HOSPITAL_COMMUNITY)
Admission: AD | Admit: 2019-06-26 | Discharge: 2019-06-28 | DRG: 481 | Disposition: A | Discharge status: Home Health | Payer: Medicare Other | Source: Ambulatory Visit | Attending: Family Medicine | Admitting: Family Medicine

## 2019-06-26 ENCOUNTER — Telehealth: Payer: Self-pay | Admitting: Family Medicine

## 2019-06-26 DIAGNOSIS — G8194 Hemiplegia, unspecified affecting left nondominant side: Secondary | ICD-10-CM | POA: Diagnosis not present

## 2019-06-26 DIAGNOSIS — G9389 Other specified disorders of brain: Secondary | ICD-10-CM | POA: Diagnosis present

## 2019-06-26 DIAGNOSIS — Z981 Arthrodesis status: Secondary | ICD-10-CM

## 2019-06-26 DIAGNOSIS — Z8249 Family history of ischemic heart disease and other diseases of the circulatory system: Secondary | ICD-10-CM | POA: Diagnosis not present

## 2019-06-26 DIAGNOSIS — S72001A Fracture of unspecified part of neck of right femur, initial encounter for closed fracture: Secondary | ICD-10-CM | POA: Diagnosis not present

## 2019-06-26 DIAGNOSIS — R001 Bradycardia, unspecified: Secondary | ICD-10-CM | POA: Diagnosis present

## 2019-06-26 DIAGNOSIS — R561 Post traumatic seizures: Secondary | ICD-10-CM | POA: Diagnosis not present

## 2019-06-26 DIAGNOSIS — S06369S Traumatic hemorrhage of cerebrum, unspecified, with loss of consciousness of unspecified duration, sequela: Secondary | ICD-10-CM | POA: Diagnosis not present

## 2019-06-26 DIAGNOSIS — Z20828 Contact with and (suspected) exposure to other viral communicable diseases: Secondary | ICD-10-CM | POA: Diagnosis present

## 2019-06-26 DIAGNOSIS — W2189XS Striking against or struck by other sports equipment, sequela: Secondary | ICD-10-CM

## 2019-06-26 DIAGNOSIS — F411 Generalized anxiety disorder: Secondary | ICD-10-CM | POA: Diagnosis not present

## 2019-06-26 DIAGNOSIS — M545 Low back pain: Secondary | ICD-10-CM | POA: Diagnosis present

## 2019-06-26 DIAGNOSIS — Z79899 Other long term (current) drug therapy: Secondary | ICD-10-CM | POA: Diagnosis not present

## 2019-06-26 DIAGNOSIS — S72011A Unspecified intracapsular fracture of right femur, initial encounter for closed fracture: Principal | ICD-10-CM | POA: Diagnosis present

## 2019-06-26 DIAGNOSIS — M8580 Other specified disorders of bone density and structure, unspecified site: Secondary | ICD-10-CM | POA: Diagnosis present

## 2019-06-26 DIAGNOSIS — Z809 Family history of malignant neoplasm, unspecified: Secondary | ICD-10-CM | POA: Diagnosis not present

## 2019-06-26 DIAGNOSIS — R509 Fever, unspecified: Secondary | ICD-10-CM | POA: Diagnosis present

## 2019-06-26 DIAGNOSIS — Y93H2 Activity, gardening and landscaping: Secondary | ICD-10-CM | POA: Diagnosis not present

## 2019-06-26 DIAGNOSIS — G2581 Restless legs syndrome: Secondary | ICD-10-CM | POA: Diagnosis present

## 2019-06-26 DIAGNOSIS — R5381 Other malaise: Secondary | ICD-10-CM | POA: Diagnosis not present

## 2019-06-26 DIAGNOSIS — S728X1A Other fracture of right femur, initial encounter for closed fracture: Secondary | ICD-10-CM | POA: Diagnosis not present

## 2019-06-26 DIAGNOSIS — S7290XA Unspecified fracture of unspecified femur, initial encounter for closed fracture: Secondary | ICD-10-CM | POA: Diagnosis present

## 2019-06-26 DIAGNOSIS — S728X1D Other fracture of right femur, subsequent encounter for closed fracture with routine healing: Secondary | ICD-10-CM | POA: Diagnosis not present

## 2019-06-26 DIAGNOSIS — F17290 Nicotine dependence, other tobacco product, uncomplicated: Secondary | ICD-10-CM | POA: Diagnosis present

## 2019-06-26 DIAGNOSIS — K219 Gastro-esophageal reflux disease without esophagitis: Secondary | ICD-10-CM | POA: Diagnosis not present

## 2019-06-26 DIAGNOSIS — S72001D Fracture of unspecified part of neck of right femur, subsequent encounter for closed fracture with routine healing: Secondary | ICD-10-CM | POA: Diagnosis not present

## 2019-06-26 DIAGNOSIS — J45909 Unspecified asthma, uncomplicated: Secondary | ICD-10-CM | POA: Diagnosis present

## 2019-06-26 DIAGNOSIS — G894 Chronic pain syndrome: Secondary | ICD-10-CM | POA: Diagnosis present

## 2019-06-26 DIAGNOSIS — E785 Hyperlipidemia, unspecified: Secondary | ICD-10-CM | POA: Diagnosis present

## 2019-06-26 DIAGNOSIS — M79604 Pain in right leg: Secondary | ICD-10-CM | POA: Insufficient documentation

## 2019-06-26 DIAGNOSIS — S72009A Fracture of unspecified part of neck of unspecified femur, initial encounter for closed fracture: Secondary | ICD-10-CM

## 2019-06-26 DIAGNOSIS — J4520 Mild intermittent asthma, uncomplicated: Secondary | ICD-10-CM | POA: Diagnosis not present

## 2019-06-26 DIAGNOSIS — Z419 Encounter for procedure for purposes other than remedying health state, unspecified: Secondary | ICD-10-CM

## 2019-06-26 DIAGNOSIS — S7291XD Unspecified fracture of right femur, subsequent encounter for closed fracture with routine healing: Secondary | ICD-10-CM | POA: Diagnosis not present

## 2019-06-26 DIAGNOSIS — Y92028 Other place in mobile home as the place of occurrence of the external cause: Secondary | ICD-10-CM

## 2019-06-26 DIAGNOSIS — W11XXXA Fall on and from ladder, initial encounter: Secondary | ICD-10-CM | POA: Diagnosis present

## 2019-06-26 DIAGNOSIS — W19XXXA Unspecified fall, initial encounter: Secondary | ICD-10-CM | POA: Diagnosis not present

## 2019-06-26 DIAGNOSIS — Z791 Long term (current) use of non-steroidal anti-inflammatories (NSAID): Secondary | ICD-10-CM

## 2019-06-26 LAB — COMPREHENSIVE METABOLIC PANEL
ALT: 15 U/L (ref 0–44)
AST: 15 U/L (ref 15–41)
Albumin: 3.8 g/dL (ref 3.5–5.0)
Alkaline Phosphatase: 50 U/L (ref 38–126)
Anion gap: 10 (ref 5–15)
BUN: 7 mg/dL (ref 6–20)
CO2: 24 mmol/L (ref 22–32)
Calcium: 9 mg/dL (ref 8.9–10.3)
Chloride: 107 mmol/L (ref 98–111)
Creatinine, Ser: 0.71 mg/dL (ref 0.61–1.24)
GFR calc Af Amer: >60 mL/min (ref >60)
GFR calc non Af Amer: >60 mL/min (ref >60)
Glucose, Bld: 92 mg/dL (ref 70–99)
Potassium: 4 mmol/L (ref 3.5–5.1)
Sodium: 141 mmol/L (ref 135–145)
Total Bilirubin: 0.8 mg/dL (ref 0.3–1.2)
Total Protein: 6.6 g/dL (ref 6.5–8.1)

## 2019-06-26 LAB — CBC WITH DIFFERENTIAL/PLATELET
Abs Immature Granulocytes: 0.02 10*3/uL (ref 0.00–0.07)
Basophils Absolute: 0 10*3/uL (ref 0.0–0.1)
Basophils Relative: 0 %
Eosinophils Absolute: 0.2 10*3/uL (ref 0.0–0.5)
Eosinophils Relative: 3 %
HCT: 37.8 % — ABNORMAL LOW (ref 39.0–52.0)
Hemoglobin: 12.7 g/dL — ABNORMAL LOW (ref 13.0–17.0)
Immature Granulocytes: 0 %
Lymphocytes Relative: 22 %
Lymphs Abs: 1.5 10*3/uL (ref 0.7–4.0)
MCH: 31.8 pg (ref 26.0–34.0)
MCHC: 33.6 g/dL (ref 30.0–36.0)
MCV: 94.5 fL (ref 80.0–100.0)
Monocytes Absolute: 0.7 10*3/uL (ref 0.1–1.0)
Monocytes Relative: 9 %
Neutro Abs: 4.5 10*3/uL (ref 1.7–7.7)
Neutrophils Relative %: 66 %
Platelets: 163 10*3/uL (ref 150–400)
RBC: 4 MIL/uL — ABNORMAL LOW (ref 4.22–5.81)
RDW: 12.4 % (ref 11.5–15.5)
WBC: 6.9 10*3/uL (ref 4.0–10.5)
nRBC: 0 % (ref 0.0–0.2)

## 2019-06-26 LAB — SURGICAL PCR SCREEN
MRSA, PCR: NEGATIVE
Staphylococcus aureus: NEGATIVE

## 2019-06-26 LAB — SARS CORONAVIRUS 2 (TAT 6-24 HRS): SARS Coronavirus 2: NEGATIVE

## 2019-06-26 LAB — HIV ANTIBODY (ROUTINE TESTING W REFLEX): HIV Screen 4th Generation wRfx: NONREACTIVE

## 2019-06-26 MED ORDER — DULOXETINE HCL 30 MG PO CPEP
30.0000 mg | ORAL_CAPSULE | Freq: Every day | ORAL | Status: DC
  Administered 2019-06-26 – 2019-06-28 (×2): 30 mg via ORAL
  Filled 2019-06-26 (×2): qty 1

## 2019-06-26 MED ORDER — HEPARIN SODIUM (PORCINE) 5000 UNIT/ML IJ SOLN
5000.0000 [IU] | Freq: Three times a day (TID) | INTRAMUSCULAR | Status: DC
  Administered 2019-06-26: 5000 [IU] via SUBCUTANEOUS
  Filled 2019-06-26: qty 1

## 2019-06-26 MED ORDER — ALPRAZOLAM 0.5 MG PO TABS: 0.5000 mg | ORAL_TABLET | Freq: Three times a day (TID) | ORAL | Status: DC | PRN

## 2019-06-26 MED ORDER — TOPIRAMATE 100 MG PO TABS
100.0000 mg | ORAL_TABLET | Freq: Every evening | ORAL | Status: DC
  Administered 2019-06-26 – 2019-06-27 (×2): 100 mg via ORAL
  Filled 2019-06-26 (×2): qty 1

## 2019-06-26 MED ORDER — GABAPENTIN 600 MG PO TABS
1200.0000 mg | ORAL_TABLET | Freq: Three times a day (TID) | ORAL | Status: DC
  Administered 2019-06-26 – 2019-06-28 (×6): 1200 mg via ORAL
  Filled 2019-06-26 (×8): qty 2

## 2019-06-26 MED ORDER — TOPIRAMATE 25 MG PO TABS
50.0000 mg | ORAL_TABLET | Freq: Every morning | ORAL | Status: DC
  Administered 2019-06-28: 50 mg via ORAL
  Filled 2019-06-26: qty 2

## 2019-06-26 MED ORDER — OXYCODONE HCL 5 MG PO TABS
5.0000 mg | ORAL_TABLET | ORAL | Status: DC | PRN
  Administered 2019-06-26: 5 mg via ORAL
  Filled 2019-06-26 (×2): qty 1

## 2019-06-26 MED ORDER — POLYETHYLENE GLYCOL 3350 17 G PO PACK
17.0000 g | PACK | Freq: Every day | ORAL | Status: DC
  Administered 2019-06-26: 17 g via ORAL
  Filled 2019-06-26 (×2): qty 1

## 2019-06-26 MED ORDER — PHENOBARBITAL 32.4 MG PO TABS
194.4000 mg | ORAL_TABLET | Freq: Every day | ORAL | Status: DC
  Administered 2019-06-26 – 2019-06-27 (×2): 194.4 mg via ORAL
  Filled 2019-06-26 (×2): qty 6

## 2019-06-26 MED ORDER — ACETAMINOPHEN 325 MG PO TABS
650.0000 mg | ORAL_TABLET | Freq: Four times a day (QID) | ORAL | Status: DC
  Administered 2019-06-26 – 2019-06-28 (×8): 650 mg via ORAL
  Filled 2019-06-26 (×8): qty 2

## 2019-06-26 MED ORDER — CALCIUM CARBONATE-VITAMIN D 500-200 MG-UNIT PO TABS
1.0000 | ORAL_TABLET | Freq: Two times a day (BID) | ORAL | Status: DC
  Administered 2019-06-26 – 2019-06-28 (×4): 1 via ORAL
  Filled 2019-06-26 (×7): qty 1

## 2019-06-26 NOTE — Telephone Encounter (Signed)
Richard Glass from Radiology contacted me regarding xray report.  I called and discussed the report with Dr. Meredith Pel who recommended hospital admission, non-weight bearing and NPO after midnight for surgery.  I called and discussed result and indication for admission with the patient. All his questions were answered. I advised him that the resident will call him as soon as we have a bed for a direct admission.  I called and spoke with the resident (Dr. Ky Barban) who will get him a bed and call him for direct admission.  Dr. Ky Barban will notify the orthopedic team once the patient gets to the hospital.  I offered him EMS transportation due to his non-weight bearing status but he declined. He prefers to come in with his friend. He has been keep his weight off his right LL.  Dg Ankle Complete Right  Result Date: 06/26/2019 CLINICAL DATA:  Right lower extremity pain after falling. EXAM: RIGHT ANKLE - COMPLETE 3+ VIEW COMPARISON:  Radiographs of the right tibia and fibula 06/24/2019. FINDINGS: The mineralization and alignment are normal. There is no evidence of acute fracture or dislocation. The joint spaces are preserved. No focal soft tissue swelling or foreign bodies identified. IMPRESSION: No acute osseous findings. Electronically Signed   By: Richardean Sale M.D.   On: 06/26/2019 10:48   Dg Femur, Min 2 Views Right  Result Date: 06/26/2019 CLINICAL DATA:  Fall. Right lower extremity pain. History of restless leg syndrome. EXAM: RIGHT FEMUR 2 VIEWS COMPARISON:  Right tibia and fibular radiographs, 06/24/2019. FINDINGS: Subcapital, nondisplaced, non comminuted fracture of the right femoral neck. Mild valgus angulation. No other fractures. No bone lesions. Hip and knee joints are normally aligned. IMPRESSION: 1. Nondisplaced, non comminuted subcapital fracture of the right femoral neck. 2. No other fractures.  No dislocation.  No bone lesion. Electronically Signed   By: Lajean Manes M.D.    On: 06/26/2019 10:46
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
## 2019-06-26 NOTE — Assessment & Plan Note (Signed)
Significant pain and inability to bear weight on RLE after fall from ladder two days ago.  Imaging in ED did not show fracture although this was only tib/fib view.  Will get additional imaging to look for fractures of femur.  Patient already on significant amount of pain medication for chronic pain.  Pt denies hx of heart disease/kidney disease/gi bleed.   - 15mg  meloxicam daily, 30 tabs - home health PT and nurse aide - amb ref to orthopedics - xray ankle and femur - f/u 4-6 weeks

## 2019-06-27 ENCOUNTER — Inpatient Hospital Stay (HOSPITAL_COMMUNITY): Payer: Medicare Other

## 2019-06-27 ENCOUNTER — Inpatient Hospital Stay (HOSPITAL_COMMUNITY): Payer: Medicare Other | Admitting: Certified Registered Nurse Anesthetist

## 2019-06-27 ENCOUNTER — Encounter (HOSPITAL_COMMUNITY): Payer: Self-pay | Admitting: Certified Registered Nurse Anesthetist

## 2019-06-27 ENCOUNTER — Encounter (HOSPITAL_COMMUNITY)
Admission: AD | Disposition: A | Discharge status: Home / Self Care | Payer: Self-pay | Source: Ambulatory Visit | Attending: Family Medicine

## 2019-06-27 DIAGNOSIS — S728X1A Other fracture of right femur, initial encounter for closed fracture: Secondary | ICD-10-CM

## 2019-06-27 HISTORY — PX: HIP PINNING,CANNULATED: SHX1758

## 2019-06-27 LAB — BASIC METABOLIC PANEL
Anion gap: 11 (ref 5–15)
BUN: 8 mg/dL (ref 6–20)
CO2: 23 mmol/L (ref 22–32)
Calcium: 8.6 mg/dL — ABNORMAL LOW (ref 8.9–10.3)
Chloride: 105 mmol/L (ref 98–111)
Creatinine, Ser: 0.83 mg/dL (ref 0.61–1.24)
GFR calc Af Amer: >60 mL/min (ref >60)
GFR calc non Af Amer: >60 mL/min (ref >60)
Glucose, Bld: 107 mg/dL — ABNORMAL HIGH (ref 70–99)
Potassium: 3.6 mmol/L (ref 3.5–5.1)
Sodium: 139 mmol/L (ref 135–145)

## 2019-06-27 LAB — CBC
HCT: 36.6 % — ABNORMAL LOW (ref 39.0–52.0)
Hemoglobin: 11.8 g/dL — ABNORMAL LOW (ref 13.0–17.0)
MCH: 30.7 pg (ref 26.0–34.0)
MCHC: 32.2 g/dL (ref 30.0–36.0)
MCV: 95.3 fL (ref 80.0–100.0)
Platelets: 179 10*3/uL (ref 150–400)
RBC: 3.84 MIL/uL — ABNORMAL LOW (ref 4.22–5.81)
RDW: 12.6 % (ref 11.5–15.5)
WBC: 5.7 10*3/uL (ref 4.0–10.5)
nRBC: 0 % (ref 0.0–0.2)

## 2019-06-27 LAB — PROTIME-INR
INR: 1.2 (ref 0.8–1.2)
Prothrombin Time: 14.7 seconds (ref 11.4–15.2)

## 2019-06-27 LAB — TYPE AND SCREEN
ABO/RH(D): B POS
Antibody Screen: NEGATIVE

## 2019-06-27 SURGERY — FIXATION, FEMUR, NECK, PERCUTANEOUS, USING SCREW
Anesthesia: Spinal | Site: Hip | Laterality: Right

## 2019-06-27 MED ORDER — ROCURONIUM BROMIDE 10 MG/ML (PF) SYRINGE
PREFILLED_SYRINGE | INTRAVENOUS | Status: AC
  Filled 2019-06-27: qty 10

## 2019-06-27 MED ORDER — EPHEDRINE SULFATE-NACL 50-0.9 MG/10ML-% IV SOSY
PREFILLED_SYRINGE | INTRAVENOUS | Status: DC | PRN
  Administered 2019-06-27: 5 mg via INTRAVENOUS
  Administered 2019-06-27: 10 mg via INTRAVENOUS
  Administered 2019-06-27: 5 mg via INTRAVENOUS

## 2019-06-27 MED ORDER — CHLORHEXIDINE GLUCONATE 4 % EX LIQD: 60.0000 mL | Freq: Once | CUTANEOUS | Status: DC

## 2019-06-27 MED ORDER — METOCLOPRAMIDE HCL 5 MG PO TABS: 5.0000 mg | ORAL_TABLET | Freq: Three times a day (TID) | ORAL | Status: DC | PRN

## 2019-06-27 MED ORDER — ONDANSETRON HCL 4 MG/2ML IJ SOLN
INTRAMUSCULAR | Status: DC | PRN
  Administered 2019-06-27: 4 mg via INTRAVENOUS

## 2019-06-27 MED ORDER — BUPIVACAINE IN DEXTROSE 0.75-8.25 % IT SOLN
INTRATHECAL | Status: DC | PRN
  Administered 2019-06-27: 2 mL via INTRATHECAL

## 2019-06-27 MED ORDER — MIDAZOLAM HCL 5 MG/5ML IJ SOLN
INTRAMUSCULAR | Status: DC | PRN
  Administered 2019-06-27: 2 mg via INTRAVENOUS

## 2019-06-27 MED ORDER — MENTHOL 3 MG MT LOZG: 1.0000 | LOZENGE | OROMUCOSAL | Status: DC | PRN

## 2019-06-27 MED ORDER — LACTATED RINGERS IV SOLN
INTRAVENOUS | Status: DC | PRN
  Administered 2019-06-27 (×2): via INTRAVENOUS

## 2019-06-27 MED ORDER — ACETAMINOPHEN 500 MG PO TABS
1000.0000 mg | ORAL_TABLET | Freq: Once | ORAL | Status: AC
  Administered 2019-06-27: 12:00:00 1000 mg via ORAL

## 2019-06-27 MED ORDER — METOCLOPRAMIDE HCL 5 MG/ML IJ SOLN: 5.0000 mg | Freq: Three times a day (TID) | INTRAMUSCULAR | Status: DC | PRN

## 2019-06-27 MED ORDER — DEXAMETHASONE SODIUM PHOSPHATE 10 MG/ML IJ SOLN
INTRAMUSCULAR | Status: AC
  Filled 2019-06-27: qty 1

## 2019-06-27 MED ORDER — OXYCODONE HCL 5 MG PO TABS
5.0000 mg | ORAL_TABLET | ORAL | Status: DC | PRN
  Administered 2019-06-27: 10 mg via ORAL
  Filled 2019-06-27: qty 2

## 2019-06-27 MED ORDER — POVIDONE-IODINE 10 % EX SWAB: 2.0000 "application " | Freq: Once | CUTANEOUS | Status: DC

## 2019-06-27 MED ORDER — PROPOFOL 500 MG/50ML IV EMUL: INTRAVENOUS | Status: DC | PRN

## 2019-06-27 MED ORDER — PHENYLEPHRINE 40 MCG/ML (10ML) SYRINGE FOR IV PUSH (FOR BLOOD PRESSURE SUPPORT)
PREFILLED_SYRINGE | INTRAVENOUS | Status: AC
  Filled 2019-06-27: qty 10

## 2019-06-27 MED ORDER — PHENOL 1.4 % MT LIQD: 1.0000 | OROMUCOSAL | Status: DC | PRN

## 2019-06-27 MED ORDER — MIDAZOLAM HCL 2 MG/2ML IJ SOLN
INTRAMUSCULAR | Status: AC
  Filled 2019-06-27: qty 2

## 2019-06-27 MED ORDER — METHOCARBAMOL 1000 MG/10ML IJ SOLN: 500.0000 mg | Freq: Four times a day (QID) | INTRAVENOUS | Status: DC | PRN

## 2019-06-27 MED ORDER — ONDANSETRON HCL 4 MG/2ML IJ SOLN: 4.0000 mg | Freq: Four times a day (QID) | INTRAMUSCULAR | Status: DC | PRN

## 2019-06-27 MED ORDER — CELECOXIB 200 MG PO CAPS
400.0000 mg | ORAL_CAPSULE | Freq: Once | ORAL | Status: AC
  Administered 2019-06-27: 12:00:00 400 mg via ORAL

## 2019-06-27 MED ORDER — ONDANSETRON HCL 4 MG/2ML IJ SOLN
INTRAMUSCULAR | Status: AC
  Filled 2019-06-27: qty 2

## 2019-06-27 MED ORDER — ASPIRIN 81 MG PO CHEW
81.0000 mg | CHEWABLE_TABLET | Freq: Every day | ORAL | Status: DC
  Administered 2019-06-27 – 2019-06-28 (×2): 81 mg via ORAL
  Filled 2019-06-27 (×2): qty 1

## 2019-06-27 MED ORDER — PROPOFOL 10 MG/ML IV BOLUS
INTRAVENOUS | Status: DC | PRN
  Administered 2019-06-27: 50 mg via INTRAVENOUS

## 2019-06-27 MED ORDER — CELECOXIB 200 MG PO CAPS: 400.0000 mg | ORAL_CAPSULE | Freq: Once | ORAL | Status: DC

## 2019-06-27 MED ORDER — LACTATED RINGERS IV SOLN
INTRAVENOUS | Status: AC
  Administered 2019-06-27: 17:00:00 via INTRAVENOUS

## 2019-06-27 MED ORDER — CEFAZOLIN SODIUM-DEXTROSE 2-4 GM/100ML-% IV SOLN
2.0000 g | INTRAVENOUS | Status: AC
  Administered 2019-06-27: 2 g via INTRAVENOUS

## 2019-06-27 MED ORDER — PROMETHAZINE HCL 25 MG/ML IJ SOLN: 6.2500 mg | INTRAMUSCULAR | Status: DC | PRN

## 2019-06-27 MED ORDER — ACETAMINOPHEN 325 MG PO TABS: 325.0000 mg | ORAL_TABLET | Freq: Four times a day (QID) | ORAL | Status: DC | PRN

## 2019-06-27 MED ORDER — FENTANYL CITRATE (PF) 250 MCG/5ML IJ SOLN
INTRAMUSCULAR | Status: DC | PRN
  Administered 2019-06-27 (×2): 50 ug via INTRAVENOUS

## 2019-06-27 MED ORDER — CEFAZOLIN SODIUM-DEXTROSE 2-4 GM/100ML-% IV SOLN
INTRAVENOUS | Status: AC
  Filled 2019-06-27: qty 100

## 2019-06-27 MED ORDER — 0.9 % SODIUM CHLORIDE (POUR BTL) OPTIME
TOPICAL | Status: DC | PRN
  Administered 2019-06-27: 1000 mL

## 2019-06-27 MED ORDER — PHENYLEPHRINE 40 MCG/ML (10ML) SYRINGE FOR IV PUSH (FOR BLOOD PRESSURE SUPPORT)
PREFILLED_SYRINGE | INTRAVENOUS | Status: DC | PRN
  Administered 2019-06-27 (×2): 80 ug via INTRAVENOUS
  Administered 2019-06-27: 120 ug via INTRAVENOUS

## 2019-06-27 MED ORDER — CEFAZOLIN SODIUM-DEXTROSE 2-4 GM/100ML-% IV SOLN
2.0000 g | Freq: Four times a day (QID) | INTRAVENOUS | Status: AC
  Administered 2019-06-27 – 2019-06-28 (×2): 2 g via INTRAVENOUS
  Filled 2019-06-27 (×2): qty 100

## 2019-06-27 MED ORDER — PROPOFOL 10 MG/ML IV BOLUS
INTRAVENOUS | Status: AC
  Filled 2019-06-27: qty 20

## 2019-06-27 MED ORDER — CELECOXIB 200 MG PO CAPS
ORAL_CAPSULE | ORAL | Status: AC
  Administered 2019-06-27: 400 mg via ORAL
  Filled 2019-06-27: qty 2

## 2019-06-27 MED ORDER — LIDOCAINE 2% (20 MG/ML) 5 ML SYRINGE
INTRAMUSCULAR | Status: AC
  Filled 2019-06-27: qty 5

## 2019-06-27 MED ORDER — PROPOFOL 500 MG/50ML IV EMUL
INTRAVENOUS | Status: DC | PRN
  Administered 2019-06-27: 75 ug/kg/min via INTRAVENOUS

## 2019-06-27 MED ORDER — ONDANSETRON HCL 4 MG PO TABS: 4.0000 mg | ORAL_TABLET | Freq: Four times a day (QID) | ORAL | Status: DC | PRN

## 2019-06-27 MED ORDER — TRAMADOL HCL 50 MG PO TABS
50.0000 mg | ORAL_TABLET | Freq: Four times a day (QID) | ORAL | Status: DC
  Administered 2019-06-27: 50 mg via ORAL
  Filled 2019-06-27: qty 1

## 2019-06-27 MED ORDER — FENTANYL CITRATE (PF) 100 MCG/2ML IJ SOLN: 25.0000 ug | INTRAMUSCULAR | Status: DC | PRN

## 2019-06-27 MED ORDER — LACTATED RINGERS IV SOLN
INTRAVENOUS | Status: DC
  Administered 2019-06-27: 12:00:00 via INTRAVENOUS

## 2019-06-27 MED ORDER — HYDROMORPHONE HCL 1 MG/ML IJ SOLN: 0.5000 mg | INTRAMUSCULAR | Status: DC | PRN

## 2019-06-27 MED ORDER — METHOCARBAMOL 500 MG PO TABS
500.0000 mg | ORAL_TABLET | Freq: Four times a day (QID) | ORAL | Status: DC | PRN
  Administered 2019-06-28: 500 mg via ORAL
  Filled 2019-06-27: qty 1

## 2019-06-27 MED ORDER — EPHEDRINE 5 MG/ML INJ
INTRAVENOUS | Status: AC
  Filled 2019-06-27: qty 10

## 2019-06-27 MED ORDER — FENTANYL CITRATE (PF) 250 MCG/5ML IJ SOLN
INTRAMUSCULAR | Status: AC
  Filled 2019-06-27: qty 5

## 2019-06-27 MED ORDER — ENSURE PRE-SURGERY PO LIQD
296.0000 mL | Freq: Once | ORAL | Status: DC
  Filled 2019-06-27: qty 296

## 2019-06-27 MED ORDER — SODIUM CHLORIDE 0.9 % IV SOLN
INTRAVENOUS | Status: DC | PRN
  Administered 2019-06-27: 50 ug/min via INTRAVENOUS

## 2019-06-27 MED ORDER — DOCUSATE SODIUM 100 MG PO CAPS
100.0000 mg | ORAL_CAPSULE | Freq: Two times a day (BID) | ORAL | Status: DC
  Administered 2019-06-27 – 2019-06-28 (×2): 100 mg via ORAL
  Filled 2019-06-27 (×3): qty 1

## 2019-06-27 MED ORDER — ACETAMINOPHEN 500 MG PO TABS
ORAL_TABLET | ORAL | Status: AC
  Administered 2019-06-27: 1000 mg via ORAL
  Filled 2019-06-27: qty 2

## 2019-06-27 SURGICAL SUPPLY — 50 items
BNDG COHESIVE 4X5 TAN STRL (GAUZE/BANDAGES/DRESSINGS) ×2 IMPLANT
COVER PERINEAL POST (MISCELLANEOUS) ×2 IMPLANT
COVER SURGICAL LIGHT HANDLE (MISCELLANEOUS) ×2 IMPLANT
COVER WAND RF STERILE (DRAPES) ×2 IMPLANT
DRAPE C-ARM 42X72 X-RAY (DRAPES) ×1 IMPLANT
DRAPE STERI IOBAN 125X83 (DRAPES) ×2 IMPLANT
DRESSING AQUACEL AG SP 3.5X4 (GAUZE/BANDAGES/DRESSINGS) IMPLANT
DRSG AQUACEL AG SP 3.5X4 (GAUZE/BANDAGES/DRESSINGS) ×2
DRSG MEPILEX BORDER 4X4 (GAUZE/BANDAGES/DRESSINGS) ×2 IMPLANT
DRSG OPSITE 4X5.5 SM (GAUZE/BANDAGES/DRESSINGS) ×2 IMPLANT
DRSG PAD ABDOMINAL 8X10 ST (GAUZE/BANDAGES/DRESSINGS) ×4 IMPLANT
DURAPREP 26ML APPLICATOR (WOUND CARE) ×2 IMPLANT
ELECT REM PT RETURN 9FT ADLT (ELECTROSURGICAL) ×2
ELECTRODE REM PT RTRN 9FT ADLT (ELECTROSURGICAL) ×1 IMPLANT
FACESHIELD WRAPAROUND (MASK) ×4 IMPLANT
FACESHIELD WRAPAROUND OR TEAM (MASK) ×2 IMPLANT
GAUZE SPONGE 2X2 8PLY STRL LF (GAUZE/BANDAGES/DRESSINGS) ×1 IMPLANT
GLOVE BIOGEL PI IND STRL 8 (GLOVE) ×1 IMPLANT
GLOVE BIOGEL PI INDICATOR 8 (GLOVE) ×1
GLOVE SURG ORTHO 8.0 STRL STRW (GLOVE) ×2 IMPLANT
GOWN STRL REUS W/ TWL LRG LVL3 (GOWN DISPOSABLE) ×1 IMPLANT
GOWN STRL REUS W/ TWL XL LVL3 (GOWN DISPOSABLE) IMPLANT
GOWN STRL REUS W/TWL LRG LVL3 (GOWN DISPOSABLE) ×2
GOWN STRL REUS W/TWL XL LVL3 (GOWN DISPOSABLE)
KIT TURNOVER KIT B (KITS) ×2 IMPLANT
MANIFOLD NEPTUNE II (INSTRUMENTS) ×2 IMPLANT
NDL HYPO 25GX1X1/2 BEV (NEEDLE) ×1 IMPLANT
NEEDLE HYPO 25GX1X1/2 BEV (NEEDLE) ×2 IMPLANT
NS IRRIG 1000ML POUR BTL (IV SOLUTION) ×2 IMPLANT
PACK GENERAL/GYN (CUSTOM PROCEDURE TRAY) ×2 IMPLANT
PAD ARMBOARD 7.5X6 YLW CONV (MISCELLANEOUS) ×4 IMPLANT
PAD CAST 4YDX4 CTTN HI CHSV (CAST SUPPLIES) ×1 IMPLANT
PADDING CAST COTTON 4X4 STRL (CAST SUPPLIES) ×2
PIN GUIDE DRILL TIP 2.8X300 (DRILL) ×2 IMPLANT
SCREW 16MM THREAD 6.5X85MM (Screw) ×1 IMPLANT
SCREW CANN 6.5X90 (Screw) ×2 IMPLANT
SCREW CANN 6.5X90 16MM THD (Screw) IMPLANT
SCREW CANNULATED 6.5X90MM (Screw) ×4 IMPLANT
SPONGE GAUZE 2X2 STER 10/PKG (GAUZE/BANDAGES/DRESSINGS) ×1
STAPLER VISISTAT 35W (STAPLE) ×2 IMPLANT
SUT ETHILON 3 0 PS 1 (SUTURE) ×1 IMPLANT
SUT VIC AB 0 CT1 27 (SUTURE) ×2
SUT VIC AB 0 CT1 27XBRD ANBCTR (SUTURE) ×1 IMPLANT
SUT VIC AB 2-0 CT1 27 (SUTURE) ×4
SUT VIC AB 2-0 CT1 TAPERPNT 27 (SUTURE) ×1 IMPLANT
SUT VICRYL 0 UR6 27IN ABS (SUTURE) ×1 IMPLANT
SYR CONTROL 10ML LL (SYRINGE) ×2 IMPLANT
TOWEL GREEN STERILE (TOWEL DISPOSABLE) ×2 IMPLANT
TOWEL GREEN STERILE FF (TOWEL DISPOSABLE) ×2 IMPLANT
WATER STERILE IRR 1000ML POUR (IV SOLUTION) ×2 IMPLANT

## 2019-06-27 NOTE — Transfer of Care (Signed)
Immediate Anesthesia Transfer of Care Note  Patient: [name]  Procedure(s) Performed: HIP PERCUTANEOUS PINNING (Right Hip)  Patient Location: PACU  Anesthesia Type:MAC and Spinal  Level of Consciousness: awake, alert  and oriented  Airway & Oxygen Therapy: Patient Spontanous Breathing  Post-op Assessment: Report given to RN and Post -op Vital signs reviewed and stable  Post vital signs: Reviewed and stable  Last Vitals:  Vitals Value Taken Time  BP    Temp    Pulse 72 06/27/19 1418  Resp 13 06/27/19 1418  SpO2 94 % 06/27/19 1418  Vitals shown include unvalidated device data.  Last Pain:  Vitals:   06/27/19 0754  TempSrc: Oral  PainSc:          Complications: No apparent anesthesia complications

## 2019-06-27 NOTE — Anesthesia Preprocedure Evaluation (Addendum)
Anesthesia Evaluation  Patient identified by MRN, date of birth, ID band Patient awake    Reviewed: Allergy & Precautions, NPO status , Patient's Chart, lab work & pertinent test results  History of Anesthesia Complications Negative for: history of anesthetic complications  Airway Mallampati: II  TM Distance: >3 FB Neck ROM: Full    Dental  (+) Poor Dentition, Dental Advisory Given   Pulmonary asthma , former smoker,    Pulmonary exam normal        Cardiovascular negative cardio ROS Normal cardiovascular exam     Neuro/Psych Seizures -, Well Controlled,  PSYCHIATRIC DISORDERS Anxiety    GI/Hepatic Neg liver ROS, GERD  ,  Endo/Other  negative endocrine ROS  Renal/GU negative Renal ROS     Musculoskeletal negative musculoskeletal ROS (+)   Abdominal   Peds  Hematology negative hematology ROS (+)   Anesthesia Other Findings Day of surgery medications reviewed with the patient.  Reproductive/Obstetrics                            Anesthesia Physical Anesthesia Plan  ASA: III  Anesthesia Plan: Spinal   Post-op Pain Management:    Induction:   PONV Risk Score and Plan: Ondansetron and Propofol infusion  Airway Management Planned: Natural Airway  Additional Equipment:   Intra-op Plan:   Post-operative Plan:   Informed Consent: I have reviewed the patients History and Physical, chart, labs and discussed the procedure including the risks, benefits and alternatives for the proposed anesthesia with the patient or authorized representative who has indicated his/her understanding and acceptance.     Dental advisory given  Plan Discussed with: CRNA, Anesthesiologist and Surgeon  Anesthesia Plan Comments:        Anesthesia Quick Evaluation

## 2019-06-27 NOTE — Anesthesia Procedure Notes (Signed)
Procedure Name: MAC Date/Time: 06/27/2019 12:50 PM Performed by: Harden Mo, CRNA Pre-anesthesia Checklist: Patient identified, Emergency Drugs available, Suction available and Patient being monitored Patient Re-evaluated:Patient Re-evaluated prior to induction Oxygen Delivery Method: Simple face mask Preoxygenation: Pre-oxygenation with 100% oxygen Induction Type: IV induction Placement Confirmation: positive ETCO2 and breath sounds checked- equal and bilateral Dental Injury: Teeth and Oropharynx as per pre-operative assessment
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
## 2019-06-27 NOTE — Progress Notes (Signed)
Went to see patient s/p surgery.  Pt sitting in bed, eating dinner.  Denies any pain, dizziness, chest pain or SOB.  HR 60bpm checked manually.  Will see in am  Carollee Leitz MD

## 2019-06-27 NOTE — Progress Notes (Signed)
PT Cancellation Note  Patient Details Name: Richard Glass MRN: [id number] DOB: [birth year]   Cancelled Treatment:    Reason Eval/Treat Not Completed: Other (comment)   For surgery today;   Will check back postop, and proceed to mobilize with any weight bearing/motion precautions ordered;   Roney Marion, [place] Pager [phone] Office [phone]    Colletta Maryland 06/27/2019, 10:24 AM

## 2019-06-27 NOTE — Plan of Care (Signed)
  Problem: Clinical Measurements: Goal: Ability to maintain clinical measurements within normal limits will improve Outcome: Progressing   Problem: Education: Goal: Knowledge of General Education information will improve Description: Including pain rating scale, medication(s)/side effects and non-pharmacologic comfort measures Outcome: Progressing   Problem: Activity: Goal: Risk for activity intolerance will decrease Outcome: Progressing   Problem: Pain Managment: Goal: General experience of comfort will improve Outcome: Progressing   Problem: Safety: Goal: Ability to remain free from injury will improve Outcome: Progressing   Problem: Skin Integrity: Goal: Risk for impaired skin integrity will decrease Outcome: Progressing

## 2019-06-27 NOTE — Anesthesia Procedure Notes (Signed)
Spinal  Patient location during procedure: OR Start time: 06/27/2019 12:34 PM End time: 06/27/2019 12:54 PM Staffing Anesthesiologist: Duane Boston, MD Performed: anesthesiologist  Preanesthetic Checklist Completed: patient identified, surgical consent, pre-op evaluation, timeout performed, IV checked, risks and benefits discussed and monitors and equipment checked Spinal Block Patient position: sitting Prep: DuraPrep Patient monitoring: cardiac monitor, continuous pulse ox and blood pressure Approach: midline Location: L2-3 Injection technique: single-shot Needle Needle type: Pencan  Needle gauge: 24 G Needle length: 9 cm Additional Notes Functioning IV was confirmed and monitors were applied. Sterile prep and drape, including hand hygiene and sterile gloves were used. The patient was positioned and the spine was prepped. The skin was anesthetized with lidocaine.  Free flow of clear CSF was obtained prior to injecting local anesthetic into the CSF.  The spinal needle aspirated freely following injection.  The needle was carefully withdrawn.  The patient tolerated the procedure well.

## 2019-06-27 NOTE — Anesthesia Postprocedure Evaluation (Signed)
Anesthesia Post Note  Patient: Richard Glass  Procedure(s) Performed: HIP PERCUTANEOUS PINNING (Right Hip)     Patient location during evaluation: PACU Anesthesia Type: Spinal Level of consciousness: awake and alert Pain management: pain level controlled Vital Signs Assessment: post-procedure vital signs reviewed and stable Respiratory status: spontaneous breathing and respiratory function stable Cardiovascular status: blood pressure returned to baseline and stable Postop Assessment: spinal receding Anesthetic complications: no    Last Vitals:  Vitals:   06/27/19 1445 06/27/19 1517  BP: (!) 95/52 (!) 104/58  Pulse: (!) 47 (!) 46  Resp: 11 16  Temp: 36.8 C 36.5 C  SpO2: 97% 98%    Last Pain:  Vitals:   06/27/19 1651  TempSrc:   PainSc: 6                  Taimur Fier DANIEL

## 2019-06-27 NOTE — Brief Op Note (Signed)
06/27/2019    06/27/2019  2:16 PM  PATIENT:  Richard Glass  48 y.o. male  PRE-OPERATIVE DIAGNOSIS:  RIGHT HIP FRACTURE  POST-OPERATIVE DIAGNOSIS:  RIGHT HIP FRACTURE  PROCEDURE:  Procedure(s): HIP PERCUTANEOUS PINNING  SURGEON:  Surgeon(s): Meredith Pel, MD  ASSISTANT: magnant pa  ANESTHESIA:   spinal  EBL: 10 ml    Total I/O In: 1000 [I.V.:1000] Out: 1680 [Urine:1650; Blood:30]  BLOOD ADMINISTERED: none  DRAINS: none   LOCAL MEDICATIONS USED:  none  SPECIMEN:  No Specimen  COUNTS:  YES  TOURNIQUET:  * No tourniquets in log *  DICTATION: .Other Dictation: Dictation Number [id number]  PLAN OF CARE: Admit to inpatient   PATIENT DISPOSITION:  PACU - hemodynamically stable

## 2019-06-27 NOTE — Progress Notes (Signed)
OT Cancellation Note  Patient Details Name: Richard Glass MRN: [id number] DOB: 1971/02/22   Cancelled Treatment:    Reason Eval/Treat Not Completed: Other (comment)( Pt receiving surgery later today, will check back in post-op to address ADLs/IADLs as schedule allows.)  Lequita Halt, OT Student  06/27/2019, 11:56 AM

## 2019-06-27 NOTE — Progress Notes (Signed)
Patient in transfer to PACU Plan for NWB for 4 weeks before progressing WB status Aspirin 81mg  for DVT prophylaxis Plan for discharge home tomorrow pending medical team clearance Follow-up with Dr. Marlou Sa in clinic in 1-2 weeks.   Recommend Oxycodone 5mg  q6h-q8h for postoperative pain control upon discharge

## 2019-06-27 NOTE — Progress Notes (Signed)
Orthopedic Tech Progress Note Patient Details:  Richard Glass [birth year] [id number] Applied Over Head Frame with Trapeze for patient Patient ID: Richard Glass, male   DOB: 09-05-1971, 48 y.o.   MRN: [id number]   Janit Pagan 06/27/2019, 6:04 PM

## 2019-06-27 NOTE — Plan of Care (Signed)

## 2019-06-28 DIAGNOSIS — S728X1D Other fracture of right femur, subsequent encounter for closed fracture with routine healing: Secondary | ICD-10-CM

## 2019-06-28 LAB — CBC WITH DIFFERENTIAL/PLATELET
Abs Immature Granulocytes: 0.01 10*3/uL (ref 0.00–0.07)
Basophils Absolute: 0 10*3/uL (ref 0.0–0.1)
Basophils Relative: 0 %
Eosinophils Absolute: 0.2 10*3/uL (ref 0.0–0.5)
Eosinophils Relative: 4 %
HCT: 33.4 % — ABNORMAL LOW (ref 39.0–52.0)
Hemoglobin: 10.8 g/dL — ABNORMAL LOW (ref 13.0–17.0)
Immature Granulocytes: 0 %
Lymphocytes Relative: 24 %
Lymphs Abs: 1.4 10*3/uL (ref 0.7–4.0)
MCH: 30.7 pg (ref 26.0–34.0)
MCHC: 32.3 g/dL (ref 30.0–36.0)
MCV: 94.9 fL (ref 80.0–100.0)
Monocytes Absolute: 0.6 10*3/uL (ref 0.1–1.0)
Monocytes Relative: 11 %
Neutro Abs: 3.6 10*3/uL (ref 1.7–7.7)
Neutrophils Relative %: 61 %
Platelets: 201 10*3/uL (ref 150–400)
RBC: 3.52 MIL/uL — ABNORMAL LOW (ref 4.22–5.81)
RDW: 12.8 % (ref 11.5–15.5)
WBC: 5.8 10*3/uL (ref 4.0–10.5)
nRBC: 0 % (ref 0.0–0.2)

## 2019-06-28 LAB — BASIC METABOLIC PANEL
Anion gap: 8 (ref 5–15)
BUN: 5 mg/dL — ABNORMAL LOW (ref 6–20)
CO2: 24 mmol/L (ref 22–32)
Calcium: 8.6 mg/dL — ABNORMAL LOW (ref 8.9–10.3)
Chloride: 109 mmol/L (ref 98–111)
Creatinine, Ser: 0.85 mg/dL (ref 0.61–1.24)
GFR calc Af Amer: >60 mL/min (ref >60)
GFR calc non Af Amer: >60 mL/min (ref >60)
Glucose, Bld: 117 mg/dL — ABNORMAL HIGH (ref 70–99)
Potassium: 3.2 mmol/L — ABNORMAL LOW (ref 3.5–5.1)
Sodium: 141 mmol/L (ref 135–145)

## 2019-06-28 MED ORDER — POLYETHYLENE GLYCOL 3350 17 G PO PACK: 17.0000 g | PACK | Freq: Every day | ORAL | 0 refills | Status: DC

## 2019-06-28 MED ORDER — APIXABAN 2.5 MG PO TABS: 2.5000 mg | ORAL_TABLET | Freq: Two times a day (BID) | ORAL | 0 refills | Status: DC

## 2019-06-28 MED ORDER — OXYCODONE HCL 5 MG PO TABS: 5.0000 mg | ORAL_TABLET | Freq: Four times a day (QID) | ORAL | 0 refills | Status: DC | PRN

## 2019-06-28 MED ORDER — APIXABAN 2.5 MG PO TABS
2.5000 mg | ORAL_TABLET | Freq: Two times a day (BID) | ORAL | Status: DC
  Filled 2019-06-28: qty 1

## 2019-06-28 MED ORDER — DOCUSATE SODIUM 100 MG PO CAPS: 100.0000 mg | ORAL_CAPSULE | Freq: Two times a day (BID) | ORAL | 0 refills | Status: DC

## 2019-06-28 MED ORDER — POTASSIUM CHLORIDE CRYS ER 20 MEQ PO TBCR
40.0000 meq | EXTENDED_RELEASE_TABLET | Freq: Once | ORAL | Status: AC
  Administered 2019-06-28: 40 meq via ORAL
  Filled 2019-06-28: qty 2

## 2019-06-28 MED FILL — POLYETHYLENE GLYCOL 3350 PO: 17 | 14 days supply | Qty: 238 | Fill #0

## 2019-06-28 MED FILL — oxyCODONE HCL 5 MG TABS: 5 | 2 days supply | Qty: 5 | Fill #0

## 2019-06-28 MED FILL — DOK 100 MG CAPS: 100 | 5 days supply | Qty: 10 | Fill #0

## 2019-06-28 MED FILL — ELIQUIS 2.5 MG TABLET: 2.5 | 30 days supply | Qty: 60 | Fill #0

## 2019-06-28 NOTE — Op Note (Signed)
NAME: Richard Glass, WE[place] MEDICAL RECORD [id number] ACCOUNT [id number] DATE OF BIRTH:[birth year] FACILITY: [facility] LOCATION: [facility] PHYSICIAN:GREGORY Randel Pigg, MD  OPERATIVE REPORT  DATE OF PROCEDURE:  06/27/2019  PREOPERATIVE DIAGNOSIS:  Right knee nondisplaced subcapital femoral neck fracture.  POSTOPERATIVE DIAGNOSIS:  Right knee nondisplaced subcapital femoral neck fracture.  PROCEDURE:  Right hip pinning.  SURGEON:  Meredith Pel, MD  ASSISTANT:  Annie Main, PA  INDICATIONS:  The patient is a 48 year old patient with right hip subcapital nondisplaced femoral neck fracture who presents for operative management after explanation of risks and benefits.  PROCEDURE IN DETAIL:  The patient was brought to the operating room where spinal anesthetic was induced.  Preoperative antibiotics were administered.  Timeout was called.  The patient was placed on the [place] bed.  Under fluoroscopic examination, the  fracture was nondisplaced.  Decision at that time was made to proceed with pinning as opposed to a total hip replacement.  At that time under fluoroscopic guidance, a guide pin was placed at the level of the lesser trochanter for the inferior screw.   This was taken across into the femoral head.  Three 6.5 Biomet screws were placed.  Each of these was about 5 mm from the articular surface but did cross the fracture site.  Thorough irrigation was performed of the incision.  The fascia was closed using  0 Vicryl suture followed by interrupted inverted 2-0 Vicryl suture and 3-0 nylon.  Aquacel dressing was placed.  The patient will be made nonweightbearing right lower extremity for about a month until this fracture has some healing.    Luke's assistance was required for opening and closing.  His assistance was a medical necessity.  JN/NUANCE  D:06/27/2019 T:06/28/2019 JOB:[id number]

## 2019-06-28 NOTE — Discharge Instructions (Signed)
Follow up with [place] on Wednesday Oct 14 at 33 am Follow up with Orthopedics, Dr Marlou Sa in 1-2 weeks for after surgery care. Non weight bearing for 4 weeks Home health assistance will be set up for you.  You will be taking a new medication that is a blood thinner to prevent blood clots.  Take Eliquis 2.5mg  one tablet 2 times a day.  If you notice any bleeding in your urine or stool please seek medical att

## 2019-06-28 NOTE — Evaluation (Signed)
Physical Therapy Evaluation Patient Details Name: Richard Glass MRN: [id number] DOB: June 03, 1971 Today's Date: 06/28/2019   History of Present Illness  Richard Glass is a 48 y.o. male presenting with right femoral neck fracture. PMH is significant for history of seizures, hyperlipidemia, remittent asthma, GERD, low back pain, anxiety,  Clinical Impression  Pt admitted with above diagnosis. Pt was able to ambulate with RW with min guard assist of 2 for safety since it was his first attempt up.  Able to maintain NWB right LE and just needed cues for not stepping too far into RW.  Overall did well and should progress and be able to go home with intermittent assist/supervision.   Pt currently with functional limitations due to the deficits listed below (see PT Problem List). Pt will benefit from skilled PT to increase their independence and safety with mobility to allow discharge to the venue listed below.      Follow Up Recommendations Home health PT;Supervision - Intermittent    Equipment Recommendations  None recommended by PT    Recommendations for Other Services       Precautions / Restrictions Precautions Precautions: Fall Restrictions Weight Bearing Restrictions: Yes RLE Weight Bearing: Non weight bearing      Mobility  Bed Mobility Overal bed mobility: Needs Assistance Bed Mobility: Supine to Sit     Supine to sit: Min guard     General bed mobility comments: increased effort, did not use rails. Verbal cues to scoot to EOB with some posterior leaning  Transfers Overall transfer level: Needs assistance Equipment used: Rolling walker (2 wheeled) Transfers: Sit to/from [facility] Sit to Stand: Min assist;+2 physical assistance;+2 safety/equipment Stand pivot transfers: Min guard;+2 physical assistance;+2 safety/equipment       General transfer comment: cues for safety  and to maintain NWB of R LE during stand - sit transition from bed to RW, pt  moving quickly and stepping out in front of walker  Ambulation/Gait Ambulation/Gait assistance: Min assist;Min guard;+2 safety/equipment Gait Distance (Feet): 45 Feet Assistive device: Rolling walker (2 wheeled) Gait Pattern/deviations: Step-to pattern;Decreased stride length   Gait velocity interpretation: 1.31 - 2.62 ft/sec, indicative of limited community ambulator General Gait Details: Pt needed cues for sequencing RW and step.  Pt holding right LE back and close to the other LE and tried to encourage the pt to not tangle his feet and he did a little better just holding the right LE beside the left and was able to maintain NWB right LE.  Did need cues to not step into RW too far as at times pt stepping too far into RW which could throw pt off balance.    Stairs            Wheelchair Mobility    Modified Rankin (Stroke Patients Only)       Balance Overall balance assessment: Needs assistance Sitting-balance support: Feet supported;No upper extremity supported Sitting balance-Leahy Scale: Fair Sitting balance - Comments: posterior leaning during scooting to EOB Postural control: Posterior lean Standing balance support: Bilateral upper extremity supported;During functional activity Standing balance-Leahy Scale: Poor Standing balance comment: able to maintain NWB of R LE using RW                             Pertinent Vitals/Pain Pain Assessment: 0-10 Pain Score: 3  Pain Location: R hip/LE Pain Descriptors / Indicators: Sore Pain Intervention(s): Limited activity within patient's tolerance;Monitored during session;Repositioned  Home Living Family/patient expects to be discharged to:: Private residence Living Arrangements: Alone Available Help at Discharge: Family;Other (Comment)(pt reports that he will have assist about 4 hrs/day) Type of Home: Mobile home Home Access: Ramped entrance     Home Layout: One level Home Equipment: [place] - 2 wheels;Walker  - standard;Bedside commode;Adaptive equipment;Cane - quad Additional Comments: short reacher    Prior Function Level of Independence: Independent with assistive device(s)         Comments: was using cane since falling, did not use an AD prior to fall and was independent with ADLs/selfcare and home mgt     Hand Dominance   Dominant Hand: Right    Extremity/Trunk Assessment   Upper Extremity Assessment Upper Extremity Assessment: Defer to OT evaluation    Lower Extremity Assessment Lower Extremity Assessment: RLE deficits/detail RLE Deficits / Details: grossly 3-/5    Cervical / Trunk Assessment Cervical / Trunk Assessment: Normal  Communication   Communication: No difficulties  Cognition Arousal/Alertness: Awake/alert Behavior During Therapy: WFL for tasks assessed/performed Overall Cognitive Status: History of cognitive impairments - at baseline                                 General Comments: 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      General Comments      Exercises General Exercises - Lower Extremity Ankle Circles/Pumps: AROM;Both;10 reps;Supine Quad Sets: AROM;Both;10 reps;Supine Long Arc Quad: AROM;Both;10 reps;Seated Hip Flexion/Marching: AROM;Both;10 reps;Seated   Assessment/Plan    PT Assessment Patient needs continued PT services  PT Problem List Decreased activity tolerance;Decreased balance;Decreased mobility;Decreased knowledge of use of DME;Decreased safety awareness;Decreased knowledge of precautions;Pain;Decreased strength;Decreased range of motion       PT Treatment Interventions DME instruction;Gait training;Functional mobility training;Therapeutic activities;Therapeutic exercise;Balance training;Patient/family education    PT Goals (Current goals can be found in the Care Plan section)  Acute Rehab PT Goals Patient Stated Goal: go home PT Goal Formulation: With patient Time For Goal Achievement: 07/12/19 Potential to Achieve Goals: Good     Frequency Min 5X/week   Barriers to discharge Decreased caregiver support      Co-evaluation PT/OT/SLP Co-Evaluation/Treatment: Yes Reason for Co-Treatment: Complexity of the patient's impairments (multi-system involvement);For patient/therapist safety PT goals addressed during session: Mobility/safety with mobility OT goals addressed during session: ADL's and self-care;Proper use of Adaptive equipment and DME       AM-PAC PT "6 Clicks" Mobility  Outcome Measure Help needed turning from your back to your side while in a flat bed without using bedrails?: A Little Help needed moving from lying on your back to sitting on the side of a flat bed without using bedrails?: A Little Help needed moving to and from a bed to a chair (including a wheelchair)?: A Little Help needed standing up from a chair using your arms (e.g., wheelchair or bedside chair)?: A Little Help needed to walk in hospital room?: A Little Help needed climbing 3-5 steps with a railing? : A Lot 6 Click Score: 17    End of Session Equipment Utilized During Treatment: Gait belt Activity Tolerance: Patient tolerated treatment well Patient left: in chair;with call bell/phone within reach;with chair alarm set Nurse Communication: Mobility status PT Visit Diagnosis: Muscle weakness (generalized) (M62.81);Unsteadiness on feet (R26.81)    Time: [id number] PT Time Calculation (min) (ACUTE ONLY): 20 min   Charges:   PT Evaluation $PT Eval Moderate Complexity: 1 Mod  [place] Pager:  [phone]  Office:  [place] 06/28/2019, 2:07 PM

## 2019-06-28 NOTE — Progress Notes (Addendum)
Family Medicine Teaching Service Daily Progress Note Intern Pager: [phone]  Patient name: Richard Glass Medical record number: [id number] Date of birth: 02-Apr-1971 Age: 48 y.o. Gender: male  Primary Care Provider: [name], [name], MD Consultants: Orthopedic surgery Code Status: Full  Pt Overview and Major Events to Date:  Richard Glass is a 48 y.o. male presenting with right femoral neck fracture. PMH is significant for history of seizures, hyperlipidemia, remittent asthma, GERD, low back pain, anxiety,   Assessment and Plan:  R Femoral Neck Fracture POD #1 right hip percutaneous pinning.  Vital stable overnight.  Afebrile.  Pain well controlled.  Required 1 dose of oxycodone and methocarbamol overnight. -Diet as tolerated -Continue home meds -Per Ortho note patient may be discharged today pending medical clearance -Start Eliquis 2.5 mg twice daily for DVT prophylaxis -Follow-up with Dr. 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  Back pain remains unchanged. -Continue home medication  RLS Stable -Continue home medication gabapentin and Cymbalta  Anxiety No PRN medications given overnight. -Continue home medications Xanax as needed -Continue Cymbalta  History of GERD Asymptomatic -Continue to monitor -Can add PPI if becomes symptomatic  Seasonal allergy -Continue home medication Flonase and Zyrtec as needed  FEN/GI: N.p.o. PPx: SCDs for DVT prophylaxis  Disposition: SNF versus home pending PT OT evaluation  Subjective:  No acute events overnight.  Patient sitting in bed eating breakfast.  Denies any chest pain, shortness of breath, abdominal pain.  No nausea or vomiting.  Lives at home alone.  Objective: Temp:  [97.7 F (36.5 C)-98.6 F (37 C)] 97.9  F (36.6 C) (10/12 0828) Pulse Rate:  [46-63] 54 (10/12 0828) Resp:  [10-18] 16 (10/12 0828) BP: (88-110)/(49-70) 110/70 (10/12 0828) SpO2:  [95 %-98 %] 97 % (10/12 [id number]) Physical Exam: General: Pleasant 48 year old gentleman in no apparent distress  Cardiovascular: RRR with no murmurs noted Respiratory: CTA bilaterally  Gastrointestinal: Bowel sounds present. No abdominal pain MSK: Moves all extremities, difficulty moving right lower extremity due to surgical intervention yesterday. Derm: No rashes noted Neuro: Alert and orientated x4. Psych: Behavior and speech appropriate to situation   Laboratory: Recent Labs  Lab 06/26/19 1831 06/27/19 0212 06/28/19 0555  WBC 6.9 5.7 5.8  HGB 12.7* 11.8* 10.8*  HCT 37.8* 36.6* 33.4*  PLT 163 179 201   Recent Labs  Lab 06/26/19 1831 06/27/19 0212 06/28/19 0555  NA 141 139 141  K 4.0 3.6 3.2*  CL 107 105 109  CO2 24 23 24   BUN 7 8 5*  CREATININE 0.71 0.83 0.85  CALCIUM 9.0 8.6* 8.6*  PROT 6.6  --   --   BILITOT 0.8  --   --   ALKPHOS 50  --   --   ALT 15  --   --   AST 15  --   --   GLUCOSE 92 107* 117*    Imaging/Diagnostic Tests: CLINICAL DATA:  Displacement. Femur fracture.  EXAM: DG HIP (WITH OR WITHOUT PELVIS) 2-3V RIGHT  COMPARISON:  06/25/2019  FINDINGS: There is an acute minimally displaced and mildly impacted subcapital/transcervical fracture of the proximal right femur. There is no dislocation. The proximal left femur is unremarkable. There are mild degenerative changes of both femoroacetabular joints. Patient is status post prior fixation of the left SI joint.  IMPRESSION:  Unchanged alignment of the previously demonstrated acute subcapital fracture of the proximal right femur.  Carollee Leitz, MD 06/28/2019, 9:18 AM PGY-1, [place] Intern pager: [phone], text pages welcome
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
## 2019-06-28 NOTE — TOC Transition Note (Signed)
Transition of Care [facility]) - CM/SW Discharge Note   Patient Details  Name: Richard Glass MRN: [id number] Date of Birth: 1970-12-06  [facility]) CM/SW Contact:  Benard Halsted, LCSW Phone Number: 06/28/2019, 4:12 PM   Clinical Narrative:    [facility] has accepted patient for home health services. No other needs identified at this time.    Final next level of care: [place] Barriers to Discharge: No Barriers Identified   Patient Goals and CMS Choice Patient states their goals for this hospitalization and ongoing recovery are:: Return home CMS Medicare.gov Compare Post Acute Care list provided to:: Patient Choice offered to / list presented to : Patient  Discharge Placement                       Discharge Plan and Services In-house Referral: NA Discharge Planning Services: CM Consult Post Acute Care Choice: Home Health          DME Arranged: N/A DME Agency: NA       HH Arranged: PT, OT [place] Agency: [facility] [employer] Date [place]: 06/28/19 Time [place]: 1458 Representative spoke with at [place]: [place] ([place]) Interventions     Readmission Risk Interventions No flowsheet data found.

## 2019-06-28 NOTE — Evaluation (Signed)
Occupational Therapy Evaluation Patient Details Name: Richard Glass MRN: [id number] DOB: [birth year] Today's Date: 06/28/2019    History of Present Illness DELOYD BRUNKE is a 48 y.o. male presenting with right femoral neck fracture. PMH is significant for history of seizures, hyperlipidemia, remittent asthma, GERD, low back pain, anxiety,   Clinical Impression    Pt with decline in function and safety with ADLs and ADL mobility with impaired balance, safety and endurance. Pt reports that PTA, he lived at home alone and was independent with ADLs/selfcare and functional mobility and just started using a cane after he fell recently. Pt currently requires set up/sup for UB ADLs seated and mod - max A with LB ADLs, min A + 2 for mobility using RW. Pt required mod verbal cues for safety and correct use of RW. Pt would benefit from acute OT services to address impairments to maximize level of function and safety    Follow Up Recommendations  Home health OT;Supervision - Intermittent    Equipment Recommendations  Tub/shower seat;Other (comment)(LH reacher, LH bath sponge)    Recommendations for Other Services       Precautions / Restrictions Precautions Precautions: Fall Restrictions Weight Bearing Restrictions: Yes RLE Weight Bearing: Non weight bearing      Mobility Bed Mobility Overal bed mobility: Needs Assistance Bed Mobility: Supine to Sit     Supine to sit: Min guard     General bed mobility comments: increased effort, did not use rails. Verbal cues to scoot to EOB with some posterior leaning  Transfers Overall transfer level: Needs assistance Equipment used: Rolling walker (2 wheeled) Transfers: Sit to/from [facility] Sit to Stand: Min assist;+2 physical assistance;+2 safety/equipment Stand pivot transfers: Min guard;+2 physical assistance;+2 safety/equipment       General transfer comment: cues for safety  and to maintain NWB of R LE during  stand - sit transition from bed to RW, pt moving quickly and stepping out in front of walker    Balance Overall balance assessment: Needs assistance Sitting-balance support: Feet supported Sitting balance-Leahy Scale: Fair Sitting balance - Comments: posterior leaning during scooting to EOB Postural control: Posterior lean Standing balance support: Bilateral upper extremity supported;During functional activity Standing balance-Leahy Scale: Poor Standing balance comment: able to maintain NWB of R LE using RW                           ADL either performed or assessed with clinical judgement   ADL Overall ADL's : Needs assistance/impaired Eating/Feeding: Independent;Sitting   Grooming: Wash/dry hands;Wash/dry face;Set up;Sitting   Upper Body Bathing: Set up/sup;Sitting   Lower Body Bathing: Moderate assistance   Upper Body Dressing : Set up/sup;Sitting   Lower Body Dressing: Maximal assistance   Toilet Transfer: Minimal assistance;+2 for physical assistance;+2 for safety/equipment;Ambulation;RW;BSC;Cueing for safety;Cueing for sequencing   Toileting- Clothing Manipulation and Hygiene: Maximal assistance;Sit to/from stand       Functional mobility during ADLs: Minimal assistance;+2 for physical assistance;+2 for safety/equipment;Cueing for safety;Cueing for sequencing;Rolling walker       Vision Baseline Vision/History: Wears glasses Wears Glasses: Reading only Patient Visual Report: No change from baseline       Perception     Praxis      Pertinent Vitals/Pain Pain Assessment: 0-10 Pain Score: 3  Pain Location: R hip/LE Pain Descriptors / Indicators: Sore Pain Intervention(s): Limited activity within patient's tolerance;Monitored during session;Premedicated before session;Repositioned     Hand Dominance Right  Extremity/Trunk Assessment Upper Extremity Assessment Upper Extremity Assessment: Overall WFL for tasks assessed   Lower Extremity  Assessment Lower Extremity Assessment: Defer to PT evaluation   Cervical / Trunk Assessment Cervical / Trunk Assessment: Normal   Communication Communication Communication: No difficulties   Cognition Arousal/Alertness: Awake/alert Behavior During Therapy: WFL for tasks assessed/performed Overall Cognitive Status: History of cognitive impairments - at baseline                                 General Comments: 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   General Comments       Exercises     Shoulder Instructions      Home Living Family/patient expects to be discharged to:: Private residence Living Arrangements: Alone Available Help at Discharge: Family;Other (Comment)(pt reports that he will have assist about 4 hrs/day) Type of Home: Mobile home Home Access: Ramped entrance     Home Layout: One level     Bathroom Shower/Tub: Occupational psychologist: Standard     Home Equipment: Environmental consultant - 2 wheels;Walker - standard;Bedside commode;Adaptive equipment;Cane - quad Adaptive Equipment: Reacher Additional Comments: short reacher      Prior Functioning/Environment Level of Independence: Independent with assistive device(s)        Comments: was using cane since falling, did not use an AD prior to fall and was independent with ADLs/selfcare and home mgt        OT Problem List: Impaired balance (sitting and/or standing);Decreased cognition;Pain;Decreased activity tolerance;Decreased knowledge of use of DME or AE      OT Treatment/Interventions: Self-care/ADL training;DME and/or AE instruction;Therapeutic activities;Balance training;Patient/family education    OT Goals(Current goals can be found in the care plan section) Acute Rehab OT Goals Patient Stated Goal: go home OT Goal Formulation: With patient Time For Goal Achievement: 07/12/19 ADL Goals Pt Will Perform Grooming: with min guard assist;standing Pt Will Perform Upper Body Bathing: with  set-up;Independently;sitting Pt Will Perform Lower Body Bathing: with min assist;sitting/lateral leans;sit to/from stand Pt Will Perform Upper Body Dressing: with set-up;Independently;sitting Pt Will Perform Lower Body Dressing: with mod assist;with min assist;sitting/lateral leans;with adaptive equipment Pt Will Transfer to Toilet: with min guard assist;ambulating Pt Will Perform Toileting - Clothing Manipulation and hygiene: with min assist;sit to/from stand Pt Will Perform Tub/Shower Transfer: with min guard assist;ambulating;rolling walker;shower seat;3 in 1 Additional ADL Goal #1: Pt will complete bed mobility with sup to sit EOB fro selfcare tasks and in prep for functional transfers  OT Frequency: Min 2X/week   Barriers to D/C:            Co-evaluation PT/OT/SLP Co-Evaluation/Treatment: Yes Reason for Co-Treatment: For patient/therapist safety   OT goals addressed during session: ADL's and self-care;Proper use of Adaptive equipment and DME      AM-PAC OT "6 Clicks" Daily Activity     Outcome Measure Help from another person eating meals?: None Help from another person taking care of personal grooming?: A Little Help from another person toileting, which includes using toliet, bedpan, or urinal?: A Lot Help from another person bathing (including washing, rinsing, drying)?: A Lot Help from another person to put on and taking off regular upper body clothing?: A Little Help from another person to put on and taking off regular lower body clothing?: A Lot 6 Click Score: 16   End of Session Equipment Utilized During Treatment: Gait belt;Rolling walker  Activity Tolerance: Patient tolerated treatment well Patient  left: in chair;with call bell/phone within reach;with chair alarm set  OT Visit Diagnosis: Unsteadiness on feet (R26.81);Other abnormalities of gait and mobility (R26.89);History of falling (Z91.81);Other symptoms and signs involving cognitive function;Pain Pain -  Right/Left: Right Pain - part of body: Hip;Leg                Time: [id number] OT Time Calculation (min): 19 min Charges:  OT General Charges $OT Visit: 1 Visit OT Evaluation $OT Eval Moderate Complexity: 1 Mod    Britt Bottom 06/28/2019, 12:08 PM

## 2019-06-28 NOTE — Progress Notes (Signed)
Richard Glass to be D/C'd home per MD order.  Discussed prescriptions and follow up appointments with the patient. Prescriptions delivered to patient from outpatient pharmacy, medication list explained in detail. Pt verbalized understanding.  Allergies as of 06/28/2019      Reactions   No Known Allergies       Medication List    STOP taking these medications   meloxicam 15 MG tablet Commonly known as: MOBIC   triamcinolone ointment 0.1 % Commonly known as: KENALOG     TAKE these medications   acetaminophen 500 MG tablet Commonly known as: TYLENOL Take 1,000 mg by mouth every 6 (six) hours as needed for moderate pain.   ALPRAZolam 1 MG tablet Commonly known as: XANAX TAKE 1/2 TABLET (0.5 MG) BY MOUTH THREE TIMES DAILY AS NEEDED. What changed: See the new instructions.   apixaban 2.5 MG Tabs tablet Commonly known as: ELIQUIS Take 1 tablet (2.5 mg total) by mouth 2 (two) times daily.   baclofen 10 MG tablet Commonly known as: LIORESAL TAKE 1/2 TABLET BY MOUTH 3 TIMES DAILY What changed: See the new instructions.   camphor-menthol lotion Commonly known as: SARNA Apply 1 application topically as needed for itching.   cetirizine 10 MG tablet Commonly known as: ZYRTEC Take 1 tablet (10 mg total) by mouth daily. What changed:   when to take this  reasons to take this   [facility] Calcium 600+D 600-800 MG-UNIT Tabs Generic drug: Calcium Carb-Cholecalciferol TAKE 1 TABLET BY MOUTH TWICE A DAY BEFORE A MEAL What changed: See the new instructions.   Diclofenac Sodium 1 % Crea Place 1 application onto the skin 2 (two) times daily.   docusate sodium 100 MG capsule Commonly known as: COLACE Take 1 capsule (100 mg total) by mouth 2 (two) times daily.   DULoxetine HCl 30 MG Csdr Take 30 mg by mouth every morning for 7 days, THEN 60 mg every morning. Start taking on: November 19, 2018 What changed: See the new instructions.   fluticasone 50 MCG/ACT nasal spray Commonly known  as: FLONASE PLACE 2 SPRAYS IN EACH NOSTRIL EVERY DAY What changed: See the new instructions.   gabapentin 600 MG tablet Commonly known as: Neurontin Take 2 tablets (1,200 mg total) by mouth 3 (three) times daily.   HYDROcodone-acetaminophen 7.5-325 MG tablet Commonly known as: Norco Take 1 tablet by mouth every 6 (six) hours as needed for moderate pain. What changed: Another medication with the same name was removed. Continue taking this medication, and follow the directions you see here.   oxyCODONE 5 MG immediate release tablet Commonly known as: Oxy IR/ROXICODONE Take 1 tablet (5 mg total) by mouth every 6 (six) hours as needed for moderate pain or severe pain. For breakthrough pain   PHENobarbital 64.8 MG tablet Commonly known as: LUMINAL TAKE 3 TABLETS BY MOUTH EVERY DAY AT BEDTIME What changed: See the new instructions.   polyethylene glycol 17 g packet Commonly known as: MIRALAX / GLYCOLAX Take 17 g by mouth daily. Start taking on: June 29, 2019   topiramate 50 MG tablet Commonly known as: TOPAMAX TAKE 1 TABLET BY MOUTH EVERY MORNING AND 2 TABLETS EVERY EVENING What changed: See the new instructions.            Durable Medical Equipment  (From admission, onward)         Start     Ordered   06/28/19 1522  For home use only DME Shower stool  Once  06/28/19 1521          Vitals:   06/28/19 0828 06/28/19 1527  BP: 110/70 137/76  Pulse: (!) 54 (!) 55  Resp: 16 16  Temp: 97.9 F (36.6 C) 98.5 F (36.9 C)  SpO2: 97% 100%    Skin clean, dry and intact without evidence of skin break down, no evidence of skin tears noted. IV catheter discontinued intact. Site without signs and symptoms of complications. Dressing and pressure applied. Pt denies pain at this time. No complaints noted.  An After Visit Summary was printed and given to the patient. Patient escorted via [place], and D/C home via private auto.  Velna Ochs 06/28/2019 5:38 PM

## 2019-06-29 ENCOUNTER — Encounter (HOSPITAL_COMMUNITY): Payer: Self-pay | Admitting: Orthopedic Surgery

## 2019-06-29 ENCOUNTER — Other Ambulatory Visit: Payer: Self-pay | Admitting: Family Medicine

## 2019-06-29 DIAGNOSIS — M8588 Other specified disorders of bone density and structure, other site: Secondary | ICD-10-CM

## 2019-06-30 ENCOUNTER — Other Ambulatory Visit: Payer: Self-pay

## 2019-06-30 ENCOUNTER — Ambulatory Visit (INDEPENDENT_AMBULATORY_CARE_PROVIDER_SITE_OTHER): Payer: Medicare Other | Admitting: Family Medicine

## 2019-06-30 VITALS — BP 120/64 | HR 79

## 2019-06-30 DIAGNOSIS — Z8781 Personal history of (healed) traumatic fracture: Secondary | ICD-10-CM | POA: Diagnosis not present

## 2019-06-30 DIAGNOSIS — S72001A Fracture of unspecified part of neck of right femur, initial encounter for closed fracture: Secondary | ICD-10-CM | POA: Diagnosis not present

## 2019-06-30 DIAGNOSIS — Z471 Aftercare following joint replacement surgery: Secondary | ICD-10-CM | POA: Diagnosis not present

## 2019-06-30 DIAGNOSIS — E876 Hypokalemia: Secondary | ICD-10-CM | POA: Diagnosis not present

## 2019-06-30 NOTE — Discharge Summary (Signed)
[facility] Discharge Summary  Patient name: Richard Glass Medical record number: [id number] Date of birth: October 19, 1970 Age: 48 y.o. Gender: male Date of Admission: 06/26/2019  Date of  Discharge:06/28/2019  Admitting Physician: Kinnie Feil, MD  Primary Care Provider: [name], [name], MD Consultants: Orthopedics  Indication for Hospitalization: Right femoral neck fracture  Discharge Diagnoses/Problem List:    Disposition: Home  Discharge Condition: Stable  Discharge Exam:  General: Alert and oriented, no apparent distress  Cardiovascular: Regular rate and rhythm, no murmurs appreciated Respiratory: Chest clear to auscultation, no crackles, no rhonchi Gastrointestinal: Soft, nontender, nondistended, bowel sounds present MSK: Moves all extremities but has some pain at surgical incision of right hip. Neuro: Alert and orientated x4 Psych: Pleasant and cooperative, answers all questions appropriately  Brief Hospital Course:  Richard Glass was admitted to the hospital after presenting to the ED for hip pain after falling off an 8 foot ladder.  X-rays of right lower leg, knee and shoulder showed no evidence of fracture but it was not evaluated at that time.  He was given a knee brace and told to be followed up by orthopedics.  Pain got progressively worse and he was seen in clinic on 10/10 for same.  Hip x-rays of hip and ankle were obtained and were concerning for nondisplaced fracture of femoral head.  He was instructed to go to [facility] for direct admission and surgical fixation. Orthopedics was consulted and after evaluation plan for surgery the following day.  On 10/11 patient was taken to the OR and hip percutaneous pinning was performed.  Instructions per Ortho nonweightbearing for 4 weeks.  Follow-up with Dr. Marlou Sa in clinic 1 to 2 weeks.  And recommended oxycodone 5 mg every 6 8 for postop pain on discharge.  Patient was started on Eliquis 2.5 mg  twice daily as he met high risk criteria after postop hip pinning. On the day of discharge vital signs are stable, afebrile and in minimal pain.  He was given instructions that were recommended by orthopedics and follow-up with family medicine in 2 days.  issues for Follow Up:  1. Monitor for increased risk of DVT.  Patient was started on Eliquis 2.5 mg twice daily, as he met high risk criteria for postop surgery hip pinning. Patient nonweightbearing for 4 weeks before progressing weightbearing status.  He is to follow-up with Dr. Marlou Sa in clinic in 1 to 2 weeks.    Significant Procedures:  10/11-right percutaneous hip pinning of femoral head Significant Labs and Imaging:  Recent Labs  Lab 06/26/19 1831 06/27/19 0212 06/28/19 0555  WBC 6.9 5.7 5.8  HGB 12.7* 11.8* 10.8*  HCT 37.8* 36.6* 33.4*  PLT 163 179 201   Recent Labs  Lab 06/24/19 0324 06/26/19 1831 06/27/19 0212 06/28/19 0555  NA 137 141 139 141  K 3.2* 4.0 3.6 3.2*  CL 107 107 105 109  CO2 20* '24 23 24  ' GLUCOSE 111* 92 107* 117*  BUN '9 7 8 ' 5*  CREATININE 0.88 0.71 0.83 0.85  CALCIUM 8.2* 9.0 8.6* 8.6*  ALKPHOS  --  50  --   --   AST  --  15  --   --   ALT  --  15  --   --   ALBUMIN  --  3.8  --   --       Results/Tests Pending at Time of Discharge: None  Discharge Medications:  Allergies as of 06/28/2019  Reactions   No Known Allergies       Medication List    STOP taking these medications   meloxicam 15 MG tablet Commonly known as: MOBIC   triamcinolone ointment 0.1 % Commonly known as: KENALOG     TAKE these medications   acetaminophen 500 MG tablet Commonly known as: TYLENOL Take 1,000 mg by mouth every 6 (six) hours as needed for moderate pain.   ALPRAZolam 1 MG tablet Commonly known as: XANAX TAKE 1/2 TABLET (0.5 MG) BY MOUTH THREE TIMES DAILY AS NEEDED. What changed: See the new instructions.   apixaban 2.5 MG Tabs tablet Commonly known as: ELIQUIS Take 1 tablet (2.5 mg total) by  mouth 2 (two) times daily.   baclofen 10 MG tablet Commonly known as: LIORESAL TAKE 1/2 TABLET BY MOUTH 3 TIMES DAILY What changed: See the new instructions.   camphor-menthol lotion Commonly known as: SARNA Apply 1 application topically as needed for itching.   cetirizine 10 MG tablet Commonly known as: ZYRTEC Take 1 tablet (10 mg total) by mouth daily. What changed:   when to take this  reasons to take this   [facility] Calcium 600+D 600-800 MG-UNIT Tabs Generic drug: Calcium Carb-Cholecalciferol TAKE 1 TABLET BY MOUTH TWICE A DAY BEFORE A MEAL What changed: See the new instructions.   Diclofenac Sodium 1 % Crea Place 1 application onto the skin 2 (two) times daily.   docusate sodium 100 MG capsule Commonly known as: COLACE Take 1 capsule (100 mg total) by mouth 2 (two) times daily.   DULoxetine HCl 30 MG Csdr Take 30 mg by mouth every morning for 7 days, THEN 60 mg every morning. Start taking on: November 19, 2018 What changed: See the new instructions.   fluticasone 50 MCG/ACT nasal spray Commonly known as: FLONASE PLACE 2 SPRAYS IN EACH NOSTRIL EVERY DAY What changed: See the new instructions.   gabapentin 600 MG tablet Commonly known as: Neurontin Take 2 tablets (1,200 mg total) by mouth 3 (three) times daily.   HYDROcodone-acetaminophen 7.5-325 MG tablet Commonly known as: Norco Take 1 tablet by mouth every 6 (six) hours as needed for moderate pain. What changed: Another medication with the same name was removed. Continue taking this medication, and follow the directions you see here.   oxyCODONE 5 MG immediate release tablet Commonly known as: Oxy IR/ROXICODONE Take 1 tablet (5 mg total) by mouth every 6 (six) hours as needed for moderate pain or severe pain. For breakthrough pain   PHENobarbital 64.8 MG tablet Commonly known as: LUMINAL TAKE 3 TABLETS BY MOUTH EVERY DAY AT BEDTIME What changed: See the new instructions.   polyethylene glycol 17 g  packet Commonly known as: MIRALAX / GLYCOLAX Take 17 g by mouth daily.   topiramate 50 MG tablet Commonly known as: TOPAMAX TAKE 1 TABLET BY MOUTH EVERY MORNING AND 2 TABLETS EVERY EVENING What changed: See the new instructions.       Discharge Instructions: Please refer to Patient Instructions section of EMR for full details.  Patient was counseled important signs and symptoms that should prompt return to medical care, changes in medications, dietary instructions, activity restrictions, and follow up appointments.   Follow-Up Appointments: Follow-up Information    [place]. Go on 06/30/2019.   Why: appointment at 1110am Contact information: [place], [facility] Follow up.   Specialty: Home Health Services Why: [place]  arranged Contact information: [place] [address] [phone]           Carollee Leitz, MD 06/30/2019, 8:10 PM PGY-1, [place]

## 2019-06-30 NOTE — Patient Instructions (Signed)
It was great to meet you today! Thank you for letting me participate in your care!  Today, we discussed your right hip fracture and I am glad the pain is well controlled on just a few pain pills. If you need more please just call in and I can send in a few more pills.   Please continue taking Eliquis twice per day for 4 weeks. I have also done repeat blood work for you today. If anything is abnormal I will call you.  Be well, Harolyn Rutherford, DO PGY-3, [name]

## 2019-06-30 NOTE — Progress Notes (Signed)
Subjective: Chief Complaint  Patient presents with  . Hospitalization Follow-up    hip fracture     HPI: Richard Glass is a 48 y.o. presenting to clinic today to discuss the following:  Hospital F/u for Hip Fracture Patient was recently hospitalized for a hip fracture that required surgery with a rod and pins. Overall, he states he is doing well. He is not supposed to be weight bearing but came in on crutches. We have provided patient with a wheelchair. He is living alone and has no one to help him at home with ADLs. He is having no difficulty breathing, no chest pain, no SOB. He is taking eliquis BID for prophylaxis to prevent DVT. He states his pain is under control - only needing 1-2 oxy per day.   ROS noted in HPI.    Social History   Tobacco Use  Smoking Status Former Smoker  . Packs/day: 1.00  . Years: 22.00  . Pack years: 22.00  . Types: Cigarettes  . Quit date: 06/22/2012  . Years since quitting: 7.0  Smokeless Tobacco Current User  . Types: Snuff   Objective: BP 120/64   Pulse 79   SpO2 98%  Vitals and nursing notes reviewed  Physical Exam Gen: Alert and Oriented x 3, NAD CV: RRR, no murmurs, normal S1, S2 split Resp: CTAB, no wheezing, rales, or rhonchi, comfortable work of breathing Abd: non-distended, non-tender, soft, +bs in all four quadrants MSK: right leg unable to bear weight, TTP along the lateral hip area and the right groin area Skin: warm, dry, intact, no rashes  Results for orders placed or performed during the hospital encounter of 06/26/19 (from the past 72 hour(s))  CBC with Differential/Platelet     Status: Abnormal   Collection Time: 06/28/19  5:55 AM  Result Value Ref Range   WBC 5.8 4.0 - 10.5 K/uL   RBC 3.52 (L) 4.22 - 5.81 MIL/uL   Hemoglobin 10.8 (L) 13.0 - 17.0 g/dL   HCT 33.4 (L) 39.0 - 52.0 %   MCV 94.9 80.0 - 100.0 fL   MCH 30.7 26.0 - 34.0 pg   MCHC 32.3 30.0 - 36.0 g/dL   RDW 12.8 11.5 - 15.5 %   Platelets 201 150 -  400 K/uL   nRBC 0.0 0.0 - 0.2 %   Neutrophils Relative % 61 %   Neutro Abs 3.6 1.7 - 7.7 K/uL   Lymphocytes Relative 24 %   Lymphs Abs 1.4 0.7 - 4.0 K/uL   Monocytes Relative 11 %   Monocytes Absolute 0.6 0.1 - 1.0 K/uL   Eosinophils Relative 4 %   Eosinophils Absolute 0.2 0.0 - 0.5 K/uL   Basophils Relative 0 %   Basophils Absolute 0.0 0.0 - 0.1 K/uL   Immature Granulocytes 0 %   Abs Immature Granulocytes 0.01 0.00 - 0.07 K/uL    Comment: Performed at [facility], [address] [place], [place] [id number]  Basic metabolic panel     Status: Abnormal   Collection Time: 06/28/19  5:55 AM  Result Value Ref Range   Sodium 141 135 - 145 mmol/L   Potassium 3.2 (L) 3.5 - 5.1 mmol/L   Chloride 109 98 - 111 mmol/L   CO2 24 22 - 32 mmol/L   Glucose, Bld 117 (H) 70 - 99 mg/dL   BUN 5 (L) 6 - 20 mg/dL   Creatinine, Ser 0.85 0.61 - 1.24 mg/dL   Calcium 8.6 (L) 8.9 -  10.3 mg/dL   GFR calc non Af Amer >60 >60 mL/min   GFR calc Af Amer >60 >60 mL/min   Anion gap 8 5 - 15    Comment: Performed at [facility] [address], [place], [place] [address]    Assessment/Plan:  S/P right hip fracture Patient doing well overall. Will attempt to get him some help at home. - PT/OT home health - Repeat CBC and BMP to follow up hospital labs - Patient has follow up with ortho in 2 weeks  Has ortho follow up in two weeks  PATIENT EDUCATION PROVIDED: See AVS    Diagnosis and plan along with any newly prescribed medication(s) were discussed in detail with this patient today. The patient verbalized understanding and agreed with the plan. Patient advised if symptoms worsen return to clinic or ER.    Orders Placed This Encounter  Procedures  . CBC  . Basic Metabolic Panel     Harolyn Rutherford, DO 06/30/2019, 11:27 AM PGY-3 [place]

## 2019-07-01 ENCOUNTER — Telehealth: Payer: Self-pay | Admitting: Licensed Clinical Social Worker

## 2019-07-01 LAB — BASIC METABOLIC PANEL
BUN/Creatinine Ratio: 11 (ref 9–20)
BUN: 8 mg/dL (ref 6–24)
CO2: 23 mmol/L (ref 20–29)
Calcium: 9 mg/dL (ref 8.7–10.2)
Chloride: 104 mmol/L (ref 96–106)
Creatinine, Ser: 0.73 mg/dL — ABNORMAL LOW (ref 0.76–1.27)
GFR calc Af Amer: 125 mL/min/{1.73_m2} (ref >59)
GFR calc non Af Amer: 108 mL/min/{1.73_m2} (ref >59)
Glucose: 92 mg/dL (ref 65–99)
Potassium: 4.1 mmol/L (ref 3.5–5.2)
Sodium: 141 mmol/L (ref 134–144)

## 2019-07-01 LAB — CBC
Hematocrit: 39.6 % (ref 37.5–51.0)
Hemoglobin: 12.7 g/dL — ABNORMAL LOW (ref 13.0–17.7)
MCH: 30.4 pg (ref 26.6–33.0)
MCHC: 32.1 g/dL (ref 31.5–35.7)
MCV: 95 fL (ref 79–97)
Platelets: 336 10*3/uL (ref 150–450)
RBC: 4.18 x10E6/uL (ref 4.14–5.80)
RDW: 12.3 % (ref 11.6–15.4)
WBC: 8.4 10*3/uL (ref 3.4–10.8)

## 2019-07-01 NOTE — Telephone Encounter (Signed)
.    Care Coordination Phone outreach Note Social Work   07/01/2019 Name: Richard Glass MRN: [id number] DOB: 12-11-1970  Richard Glass is a 48 y.o. year old male who sees [name], [name], MD for primary care. LCSW was consulted by Dr. Garlan Fillers for assistance with possible SNF placement versed home health.  Called patient to assess needs and barriers. Assessment : Patient reports he hadHome PT today and did well.  He is no longer interested in SNF placement. His main concern is getting food consistently without relying on his neighbors to bring him food as they are unable to bring the food daily  Recommendation:Patient may benefit from and is in agreement to explore options with Mom's Meals. Interventions:Provided patient with information about Mom's Meals, called [facility] with patient on the line to see if he qualified for 7 days of meals via his insurance plan since he was recently discharged from the hospital.  Per Claiborne Billings with [facility] Medicare on Nurses line patient will qualify for 14 meals and everything is being set up. Plan:  1. 14 Mom's meals will be delivered to patient 2.  Patient's sister-in-law will make sure he has food in his home this weekend 3. LCSW will F/U with patient in 5 to 7 days. Dr. Allen Kell has been informed of this outreach and plan via in-basket message  Richard Glass, [place] / [place]   [phone] 1:37 PM

## 2019-07-02 DIAGNOSIS — R208 Other disturbances of skin sensation: Secondary | ICD-10-CM | POA: Diagnosis not present

## 2019-07-02 DIAGNOSIS — J302 Other seasonal allergic rhinitis: Secondary | ICD-10-CM | POA: Diagnosis not present

## 2019-07-02 DIAGNOSIS — S72011D Unspecified intracapsular fracture of right femur, subsequent encounter for closed fracture with routine healing: Secondary | ICD-10-CM | POA: Diagnosis not present

## 2019-07-02 DIAGNOSIS — Z87891 Personal history of nicotine dependence: Secondary | ICD-10-CM | POA: Diagnosis not present

## 2019-07-02 DIAGNOSIS — Z87820 Personal history of traumatic brain injury: Secondary | ICD-10-CM | POA: Diagnosis not present

## 2019-07-02 DIAGNOSIS — M1990 Unspecified osteoarthritis, unspecified site: Secondary | ICD-10-CM | POA: Diagnosis not present

## 2019-07-02 DIAGNOSIS — M533 Sacrococcygeal disorders, not elsewhere classified: Secondary | ICD-10-CM | POA: Diagnosis not present

## 2019-07-02 DIAGNOSIS — M8580 Other specified disorders of bone density and structure, unspecified site: Secondary | ICD-10-CM | POA: Diagnosis not present

## 2019-07-02 DIAGNOSIS — Z96641 Presence of right artificial hip joint: Secondary | ICD-10-CM | POA: Diagnosis not present

## 2019-07-02 DIAGNOSIS — G4700 Insomnia, unspecified: Secondary | ICD-10-CM | POA: Diagnosis not present

## 2019-07-02 DIAGNOSIS — Z7901 Long term (current) use of anticoagulants: Secondary | ICD-10-CM | POA: Diagnosis not present

## 2019-07-02 DIAGNOSIS — G894 Chronic pain syndrome: Secondary | ICD-10-CM | POA: Diagnosis not present

## 2019-07-02 DIAGNOSIS — E785 Hyperlipidemia, unspecified: Secondary | ICD-10-CM | POA: Diagnosis not present

## 2019-07-02 DIAGNOSIS — K219 Gastro-esophageal reflux disease without esophagitis: Secondary | ICD-10-CM | POA: Diagnosis not present

## 2019-07-02 DIAGNOSIS — H9190 Unspecified hearing loss, unspecified ear: Secondary | ICD-10-CM | POA: Diagnosis not present

## 2019-07-02 DIAGNOSIS — I10 Essential (primary) hypertension: Secondary | ICD-10-CM | POA: Diagnosis not present

## 2019-07-02 DIAGNOSIS — M5136 Other intervertebral disc degeneration, lumbar region: Secondary | ICD-10-CM | POA: Diagnosis not present

## 2019-07-02 DIAGNOSIS — G2581 Restless legs syndrome: Secondary | ICD-10-CM | POA: Diagnosis not present

## 2019-07-02 DIAGNOSIS — R569 Unspecified convulsions: Secondary | ICD-10-CM | POA: Diagnosis not present

## 2019-07-02 DIAGNOSIS — Z9181 History of falling: Secondary | ICD-10-CM | POA: Diagnosis not present

## 2019-07-02 DIAGNOSIS — G252 Other specified forms of tremor: Secondary | ICD-10-CM | POA: Diagnosis not present

## 2019-07-02 DIAGNOSIS — Z471 Aftercare following joint replacement surgery: Secondary | ICD-10-CM | POA: Diagnosis not present

## 2019-07-02 DIAGNOSIS — J4520 Mild intermittent asthma, uncomplicated: Secondary | ICD-10-CM | POA: Diagnosis not present

## 2019-07-06 DIAGNOSIS — K219 Gastro-esophageal reflux disease without esophagitis: Secondary | ICD-10-CM | POA: Diagnosis not present

## 2019-07-06 DIAGNOSIS — M5136 Other intervertebral disc degeneration, lumbar region: Secondary | ICD-10-CM | POA: Diagnosis not present

## 2019-07-06 DIAGNOSIS — J4520 Mild intermittent asthma, uncomplicated: Secondary | ICD-10-CM | POA: Diagnosis not present

## 2019-07-06 DIAGNOSIS — R208 Other disturbances of skin sensation: Secondary | ICD-10-CM | POA: Diagnosis not present

## 2019-07-06 DIAGNOSIS — H9190 Unspecified hearing loss, unspecified ear: Secondary | ICD-10-CM | POA: Diagnosis not present

## 2019-07-06 DIAGNOSIS — Z7901 Long term (current) use of anticoagulants: Secondary | ICD-10-CM | POA: Diagnosis not present

## 2019-07-06 DIAGNOSIS — I10 Essential (primary) hypertension: Secondary | ICD-10-CM | POA: Diagnosis not present

## 2019-07-06 DIAGNOSIS — Z96641 Presence of right artificial hip joint: Secondary | ICD-10-CM | POA: Diagnosis not present

## 2019-07-06 DIAGNOSIS — Z87820 Personal history of traumatic brain injury: Secondary | ICD-10-CM | POA: Diagnosis not present

## 2019-07-06 DIAGNOSIS — S72011D Unspecified intracapsular fracture of right femur, subsequent encounter for closed fracture with routine healing: Secondary | ICD-10-CM | POA: Diagnosis not present

## 2019-07-06 DIAGNOSIS — G894 Chronic pain syndrome: Secondary | ICD-10-CM | POA: Diagnosis not present

## 2019-07-06 DIAGNOSIS — Z87891 Personal history of nicotine dependence: Secondary | ICD-10-CM | POA: Diagnosis not present

## 2019-07-06 DIAGNOSIS — M1990 Unspecified osteoarthritis, unspecified site: Secondary | ICD-10-CM | POA: Diagnosis not present

## 2019-07-06 DIAGNOSIS — M533 Sacrococcygeal disorders, not elsewhere classified: Secondary | ICD-10-CM | POA: Diagnosis not present

## 2019-07-06 DIAGNOSIS — G4700 Insomnia, unspecified: Secondary | ICD-10-CM | POA: Diagnosis not present

## 2019-07-06 DIAGNOSIS — R569 Unspecified convulsions: Secondary | ICD-10-CM | POA: Diagnosis not present

## 2019-07-06 DIAGNOSIS — Z9181 History of falling: Secondary | ICD-10-CM | POA: Diagnosis not present

## 2019-07-06 DIAGNOSIS — J302 Other seasonal allergic rhinitis: Secondary | ICD-10-CM | POA: Diagnosis not present

## 2019-07-06 DIAGNOSIS — G2581 Restless legs syndrome: Secondary | ICD-10-CM | POA: Diagnosis not present

## 2019-07-06 DIAGNOSIS — Z471 Aftercare following joint replacement surgery: Secondary | ICD-10-CM | POA: Diagnosis not present

## 2019-07-06 DIAGNOSIS — E785 Hyperlipidemia, unspecified: Secondary | ICD-10-CM | POA: Diagnosis not present

## 2019-07-06 DIAGNOSIS — G252 Other specified forms of tremor: Secondary | ICD-10-CM | POA: Diagnosis not present

## 2019-07-06 DIAGNOSIS — M8580 Other specified disorders of bone density and structure, unspecified site: Secondary | ICD-10-CM | POA: Diagnosis not present

## 2019-07-07 DIAGNOSIS — Z8781 Personal history of (healed) traumatic fracture: Secondary | ICD-10-CM | POA: Insufficient documentation

## 2019-07-07 NOTE — Assessment & Plan Note (Signed)
Patient doing well overall. Will attempt to get him some help at home. - PT/OT home health - Repeat CBC and BMP to follow up hospital labs - Patient has follow up with ortho in 2 weeks

## 2019-07-08 ENCOUNTER — Encounter: Payer: Self-pay | Admitting: Licensed Clinical Social Worker

## 2019-07-08 ENCOUNTER — Telehealth: Payer: Self-pay | Admitting: Licensed Clinical Social Worker

## 2019-07-08 DIAGNOSIS — Z7901 Long term (current) use of anticoagulants: Secondary | ICD-10-CM | POA: Diagnosis not present

## 2019-07-08 DIAGNOSIS — E785 Hyperlipidemia, unspecified: Secondary | ICD-10-CM | POA: Diagnosis not present

## 2019-07-08 DIAGNOSIS — G4700 Insomnia, unspecified: Secondary | ICD-10-CM | POA: Diagnosis not present

## 2019-07-08 DIAGNOSIS — I10 Essential (primary) hypertension: Secondary | ICD-10-CM | POA: Diagnosis not present

## 2019-07-08 DIAGNOSIS — S72011D Unspecified intracapsular fracture of right femur, subsequent encounter for closed fracture with routine healing: Secondary | ICD-10-CM | POA: Diagnosis not present

## 2019-07-08 DIAGNOSIS — M8580 Other specified disorders of bone density and structure, unspecified site: Secondary | ICD-10-CM | POA: Diagnosis not present

## 2019-07-08 DIAGNOSIS — Z471 Aftercare following joint replacement surgery: Secondary | ICD-10-CM | POA: Diagnosis not present

## 2019-07-08 DIAGNOSIS — Z96641 Presence of right artificial hip joint: Secondary | ICD-10-CM | POA: Diagnosis not present

## 2019-07-08 DIAGNOSIS — M533 Sacrococcygeal disorders, not elsewhere classified: Secondary | ICD-10-CM | POA: Diagnosis not present

## 2019-07-08 DIAGNOSIS — G894 Chronic pain syndrome: Secondary | ICD-10-CM | POA: Diagnosis not present

## 2019-07-08 DIAGNOSIS — G2581 Restless legs syndrome: Secondary | ICD-10-CM | POA: Diagnosis not present

## 2019-07-08 DIAGNOSIS — M1990 Unspecified osteoarthritis, unspecified site: Secondary | ICD-10-CM | POA: Diagnosis not present

## 2019-07-08 DIAGNOSIS — G252 Other specified forms of tremor: Secondary | ICD-10-CM | POA: Diagnosis not present

## 2019-07-08 DIAGNOSIS — J302 Other seasonal allergic rhinitis: Secondary | ICD-10-CM | POA: Diagnosis not present

## 2019-07-08 DIAGNOSIS — M5136 Other intervertebral disc degeneration, lumbar region: Secondary | ICD-10-CM | POA: Diagnosis not present

## 2019-07-08 DIAGNOSIS — R569 Unspecified convulsions: Secondary | ICD-10-CM | POA: Diagnosis not present

## 2019-07-08 DIAGNOSIS — J4520 Mild intermittent asthma, uncomplicated: Secondary | ICD-10-CM | POA: Diagnosis not present

## 2019-07-08 DIAGNOSIS — Z9181 History of falling: Secondary | ICD-10-CM | POA: Diagnosis not present

## 2019-07-08 DIAGNOSIS — Z87891 Personal history of nicotine dependence: Secondary | ICD-10-CM | POA: Diagnosis not present

## 2019-07-08 DIAGNOSIS — Z87820 Personal history of traumatic brain injury: Secondary | ICD-10-CM | POA: Diagnosis not present

## 2019-07-08 DIAGNOSIS — K219 Gastro-esophageal reflux disease without esophagitis: Secondary | ICD-10-CM | POA: Diagnosis not present

## 2019-07-08 DIAGNOSIS — H9190 Unspecified hearing loss, unspecified ear: Secondary | ICD-10-CM | POA: Diagnosis not present

## 2019-07-08 DIAGNOSIS — R208 Other disturbances of skin sensation: Secondary | ICD-10-CM | POA: Diagnosis not present

## 2019-07-08 NOTE — Telephone Encounter (Signed)
   Care Coordination Follow up Phone Note Social Work   07/08/2019 Name: JAWANN HLAVINKA MRN: [id number] DOB: October 27, 1970  [name] is a 48 y.o. year old male who sees [name], [name], MD for primary care.  Reason for follow-up: phone encounter with patient today to assess needs and barriers with obtaining food.  Patient reports: mom's meals were delivered.  Sister-in-law provided groceries for him and his neighbors check on him every other day. He continues to have physical therapy two times a week but is still none weight bearing.  He reports no concerns or needs for LCSW at this time. Pervious goals to obtain food has been accomplished.  No other goals set at this time Interventions:Patient interviewed and appropriate assessments performed to determine if there were ongoing needs. Transportation needs provided by friends, patient reports no concern at this time however LCSW reviewed transportation options with patient and reminded him to call phone number on the back of insurance card if he needed transportation to medical appointments. Plan:  1. Patient will contact office if needed 2. No further follow up required: by LCSW at this time   Casimer Lanius, [place] / [place]   [id number] 12:21 PM

## 2019-07-13 ENCOUNTER — Inpatient Hospital Stay: Payer: Medicare Other | Admitting: Family Medicine

## 2019-07-13 DIAGNOSIS — Z87891 Personal history of nicotine dependence: Secondary | ICD-10-CM | POA: Diagnosis not present

## 2019-07-13 DIAGNOSIS — E785 Hyperlipidemia, unspecified: Secondary | ICD-10-CM | POA: Diagnosis not present

## 2019-07-13 DIAGNOSIS — M533 Sacrococcygeal disorders, not elsewhere classified: Secondary | ICD-10-CM | POA: Diagnosis not present

## 2019-07-13 DIAGNOSIS — I10 Essential (primary) hypertension: Secondary | ICD-10-CM | POA: Diagnosis not present

## 2019-07-13 DIAGNOSIS — Z7901 Long term (current) use of anticoagulants: Secondary | ICD-10-CM | POA: Diagnosis not present

## 2019-07-13 DIAGNOSIS — Z96641 Presence of right artificial hip joint: Secondary | ICD-10-CM | POA: Diagnosis not present

## 2019-07-13 DIAGNOSIS — J302 Other seasonal allergic rhinitis: Secondary | ICD-10-CM | POA: Diagnosis not present

## 2019-07-13 DIAGNOSIS — G894 Chronic pain syndrome: Secondary | ICD-10-CM | POA: Diagnosis not present

## 2019-07-13 DIAGNOSIS — R569 Unspecified convulsions: Secondary | ICD-10-CM | POA: Diagnosis not present

## 2019-07-13 DIAGNOSIS — Z9181 History of falling: Secondary | ICD-10-CM | POA: Diagnosis not present

## 2019-07-13 DIAGNOSIS — M5136 Other intervertebral disc degeneration, lumbar region: Secondary | ICD-10-CM | POA: Diagnosis not present

## 2019-07-13 DIAGNOSIS — G4700 Insomnia, unspecified: Secondary | ICD-10-CM | POA: Diagnosis not present

## 2019-07-13 DIAGNOSIS — G2581 Restless legs syndrome: Secondary | ICD-10-CM | POA: Diagnosis not present

## 2019-07-13 DIAGNOSIS — G252 Other specified forms of tremor: Secondary | ICD-10-CM | POA: Diagnosis not present

## 2019-07-13 DIAGNOSIS — S72011D Unspecified intracapsular fracture of right femur, subsequent encounter for closed fracture with routine healing: Secondary | ICD-10-CM | POA: Diagnosis not present

## 2019-07-13 DIAGNOSIS — R208 Other disturbances of skin sensation: Secondary | ICD-10-CM | POA: Diagnosis not present

## 2019-07-13 DIAGNOSIS — K219 Gastro-esophageal reflux disease without esophagitis: Secondary | ICD-10-CM | POA: Diagnosis not present

## 2019-07-13 DIAGNOSIS — Z471 Aftercare following joint replacement surgery: Secondary | ICD-10-CM | POA: Diagnosis not present

## 2019-07-13 DIAGNOSIS — H9190 Unspecified hearing loss, unspecified ear: Secondary | ICD-10-CM | POA: Diagnosis not present

## 2019-07-13 DIAGNOSIS — J4520 Mild intermittent asthma, uncomplicated: Secondary | ICD-10-CM | POA: Diagnosis not present

## 2019-07-13 DIAGNOSIS — Z87820 Personal history of traumatic brain injury: Secondary | ICD-10-CM | POA: Diagnosis not present

## 2019-07-13 DIAGNOSIS — M8580 Other specified disorders of bone density and structure, unspecified site: Secondary | ICD-10-CM | POA: Diagnosis not present

## 2019-07-13 DIAGNOSIS — M1990 Unspecified osteoarthritis, unspecified site: Secondary | ICD-10-CM | POA: Diagnosis not present

## 2019-07-14 ENCOUNTER — Inpatient Hospital Stay: Payer: Medicare Other | Admitting: Orthopedic Surgery

## 2019-07-15 DIAGNOSIS — M8580 Other specified disorders of bone density and structure, unspecified site: Secondary | ICD-10-CM | POA: Diagnosis not present

## 2019-07-15 DIAGNOSIS — Z471 Aftercare following joint replacement surgery: Secondary | ICD-10-CM | POA: Diagnosis not present

## 2019-07-15 DIAGNOSIS — J302 Other seasonal allergic rhinitis: Secondary | ICD-10-CM | POA: Diagnosis not present

## 2019-07-15 DIAGNOSIS — Z7901 Long term (current) use of anticoagulants: Secondary | ICD-10-CM | POA: Diagnosis not present

## 2019-07-15 DIAGNOSIS — G4700 Insomnia, unspecified: Secondary | ICD-10-CM | POA: Diagnosis not present

## 2019-07-15 DIAGNOSIS — M5136 Other intervertebral disc degeneration, lumbar region: Secondary | ICD-10-CM | POA: Diagnosis not present

## 2019-07-15 DIAGNOSIS — Z87820 Personal history of traumatic brain injury: Secondary | ICD-10-CM | POA: Diagnosis not present

## 2019-07-15 DIAGNOSIS — M1990 Unspecified osteoarthritis, unspecified site: Secondary | ICD-10-CM | POA: Diagnosis not present

## 2019-07-15 DIAGNOSIS — S72011D Unspecified intracapsular fracture of right femur, subsequent encounter for closed fracture with routine healing: Secondary | ICD-10-CM | POA: Diagnosis not present

## 2019-07-15 DIAGNOSIS — R208 Other disturbances of skin sensation: Secondary | ICD-10-CM | POA: Diagnosis not present

## 2019-07-15 DIAGNOSIS — H9190 Unspecified hearing loss, unspecified ear: Secondary | ICD-10-CM | POA: Diagnosis not present

## 2019-07-15 DIAGNOSIS — M533 Sacrococcygeal disorders, not elsewhere classified: Secondary | ICD-10-CM | POA: Diagnosis not present

## 2019-07-15 DIAGNOSIS — Z87891 Personal history of nicotine dependence: Secondary | ICD-10-CM | POA: Diagnosis not present

## 2019-07-15 DIAGNOSIS — J4520 Mild intermittent asthma, uncomplicated: Secondary | ICD-10-CM | POA: Diagnosis not present

## 2019-07-15 DIAGNOSIS — I10 Essential (primary) hypertension: Secondary | ICD-10-CM | POA: Diagnosis not present

## 2019-07-15 DIAGNOSIS — Z96641 Presence of right artificial hip joint: Secondary | ICD-10-CM | POA: Diagnosis not present

## 2019-07-15 DIAGNOSIS — E785 Hyperlipidemia, unspecified: Secondary | ICD-10-CM | POA: Diagnosis not present

## 2019-07-15 DIAGNOSIS — Z9181 History of falling: Secondary | ICD-10-CM | POA: Diagnosis not present

## 2019-07-15 DIAGNOSIS — G2581 Restless legs syndrome: Secondary | ICD-10-CM | POA: Diagnosis not present

## 2019-07-15 DIAGNOSIS — K219 Gastro-esophageal reflux disease without esophagitis: Secondary | ICD-10-CM | POA: Diagnosis not present

## 2019-07-15 DIAGNOSIS — R569 Unspecified convulsions: Secondary | ICD-10-CM | POA: Diagnosis not present

## 2019-07-15 DIAGNOSIS — G252 Other specified forms of tremor: Secondary | ICD-10-CM | POA: Diagnosis not present

## 2019-07-15 DIAGNOSIS — G894 Chronic pain syndrome: Secondary | ICD-10-CM | POA: Diagnosis not present

## 2019-07-16 ENCOUNTER — Ambulatory Visit (INDEPENDENT_AMBULATORY_CARE_PROVIDER_SITE_OTHER): Payer: Medicare Other | Admitting: Orthopedic Surgery

## 2019-07-16 ENCOUNTER — Encounter: Payer: Self-pay | Admitting: Orthopedic Surgery

## 2019-07-16 ENCOUNTER — Ambulatory Visit (INDEPENDENT_AMBULATORY_CARE_PROVIDER_SITE_OTHER): Payer: Medicare Other

## 2019-07-16 ENCOUNTER — Other Ambulatory Visit: Payer: Self-pay

## 2019-07-16 DIAGNOSIS — Z8781 Personal history of (healed) traumatic fracture: Secondary | ICD-10-CM

## 2019-07-16 MED ORDER — METHOCARBAMOL 500 MG PO TABS: 500.0000 mg | ORAL_TABLET | Freq: Three times a day (TID) | ORAL | 0 refills | Status: DC | PRN

## 2019-07-16 NOTE — Progress Notes (Signed)
Post-Op Visit Note   Patient: Richard Glass           Date of Birth: 12/09/1970           MRN: [id number] Visit Date: 07/16/2019 PCP: [name], [name], MD   Assessment & Plan:  Chief Complaint: No chief complaint on file.  Visit Diagnoses:  1. S/P right hip fracture     Plan: Richard Glass is now 48 years old about 18 days out right hip fracture pinning.  Been doing reasonably well.  On exam not much with groin pain with internal/external rotation.  No right knee effusion.  Plan is partial weightbearing on that right hip.  Sutures removed today.  Come back in 3 weeks for repeat clinical check and x-rays.  Add Robaxin for muscle spasms.  No calf tenderness negative Homans today.  Follow-Up Instructions: Return in about 3 weeks (around 08/06/2019).   Orders:  Orders Placed This Encounter  Procedures  . XR HIP UNILAT W OR W/O PELVIS 2-3 VIEWS RIGHT   No orders of the defined types were placed in this encounter.   Imaging: Xr Hip Unilat W Or W/o Pelvis 2-3 Views Right  Result Date: 07/16/2019 AP pelvis lateral right hip reviewed.  3 screws transfix nondisplaced subcapital femoral head fracture.  No evidence of hardware complication.  Not much settling of the fracture.  Remainder of the visualized portion of the femur and pelvis normal.   PMFS History: Patient Active Problem List   Diagnosis Date Noted  . S/P right hip fracture 07/07/2019  . Right leg pain 06/26/2019  . Femoral fracture ([place]) 06/26/2019  . Hearing loss 05/01/2019  . Burn from the sun 04/20/2019  . Chronic left leg pain 11/20/2018  . Allodynia, left leg 11/20/2018  . Left foot pain 08/21/2018  . Hamstring muscle strain, left, initial encounter 06/26/2018  . History of traumatic brain injury 02/23/2018  . Contact dermatitis or eczema 02/13/2018  . High risk medication use, Combination of Opioid and Benzodiazapine 02/12/2018  . 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 Encephalomalacia on imaging study 05/01/2017   . SI (sacroiliac) pain 03/20/2017  . Rash 10/10/2016  . Enchondroma of left fibular head 05/17/2016  . Chronic pain syndrome 01/18/2016  . Insect bites 01/18/2016  . Anxiety state 08/08/2013  . Insomnia 02/02/2013  . Right flank pain 05/21/2011  . ASTHMA, INTERMITTENT 07/08/2010  . Intention tremor 10/31/2009  . Encounter for chronic pain management 07/04/2009  . Overweight(278.02) 06/13/2009  . Restless leg 09/29/2007  . Hyperlipidemia 11/13/2006  . Allergic rhinitis 11/13/2006  . GASTROESOPHAGEAL REFLUX, NO ESOPHAGITIS 11/13/2006  . Low back pain 11/13/2006  . Convulsions ([place]) 11/13/2006   Past Medical History:  Diagnosis Date  . 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  . Allergic rhinitis 11/13/2006       . Anxiety   . Anxiety state 08/08/2013  . Asthma   . ASTHMA, INTERMITTENT 07/08/2010   Qualifier: Diagnosis of  By: Walker Kehr MD, Patrick Jupiter    . Back pain of lumbar region with sciatica 11/13/2006   MRI 12/2014 - lumbar DDD without focal neural impingement  MRI Lumbar 10/30/16 ([place]) Small central L5-S1 disc extrusion with minimal cranial migration and associated annular fissure approaches descending left S1 nerve roots without contact or displacement. Minimal bulging disc height loss without spinal canal stenosis or neural foraminal narrowing.  Minimal bulging disc at L2-L3 through L4-L5 without spinal canal stenosis or  neural foraminal narrowing.  MRI Sacrum 10/30/16 ([place]) No fracture. Small lesion left sacral ala most likely a subchondral cyst/erosion adjacent to the sacroiliac joint.     . 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 Carpal tunnel syndrome 01/09/2015   Left 12/2014   . Cervical spine pain 02/06/2015   MRI 03/2015 FINDINGS: Vertebral body height, signal and alignment are normal. The craniocervical junction is normal and cervical cord signal is  normal. The central spinal canal and neural foramina are widely patent at all levels. Scattered, mild degenerative change appears most notable at C4-5. Imaged paraspinous structures are unremarkable.  IMPRESSION: Negative for central canal or foraminal narrowing. No finding to explain the patient's symptoms. Scattered, mild facet degenerative disease is noted.   . Chronic low back pain   . Chronic pain 01/18/2016  . Chronic pain syndrome 01/18/2016  . Coccyx pain 02/06/2015  . Convulsions ([place]) 11/13/2006     Cystic encephalomalacia left temporal and parietal regions from remote injury.   . Degenerative arthritis   . Dyslipidemia   . External hemorrhoid, bleeding 06/04/2011  . GASTROESOPHAGEAL REFLUX, NO ESOPHAGITIS 11/13/2006   Qualifier: Diagnosis of  By: Eusebio Friendly    . Headache(784.0)   . Hyperlipidemia 11/13/2006   07/2016  ASCVD score of 4.7% - recommended lifestyle changes   . Insomnia 02/02/2013  . Intention tremor 10/31/2009   Qualifier: Diagnosis of  By: Walker Kehr MD, Patrick Jupiter    . Intercostal pain 04/25/2015  . Left knee injury 05/09/2016  . 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 Morton's neuroma of left foot 01/18/2016  . Osteopenia 10/10/2016  . Pain in joint, shoulder region 07/05/2014   LEFT > Right Reports hx rotator cuff surgery on rigt shoulder previously (Dr Nicholes Stairs) but no notes available XRAY right shoulder showed some proliferative changes distal rigt clavicle   . Rash and nonspecific skin eruption 10/10/2016  . Restless leg 09/29/2007   Qualifier: Diagnosis of  By: Walker Kehr MD, Patrick Jupiter    . Restless legs syndrome (RLS)   . Sciatica of left side 10/26/2014  . Seizures ([place])    last >23yrs  . SI (sacroiliac) pain 03/20/2017  . Sinusitis chronic, frontal   . Status post lumbar spinal fusion 03/20/2017  . Subacromial or subdeltoid bursitis 08/08/2009   MRI C-spine 2011([facility]) - normal MRI left shoulder 10/2010 ([facility]) - AC degenerative disease, normal rotator cuff 12/2012  -debridement, acromioplasty and distal clavicle excision of left shoulder, arthroscopy also performed an no need for rotator cuff repair - performed by [facility] 02/2013 - Nerve Conduction Studies ([facility]) - ulnar nerve compression 02/2013- taken back to OR for Ulnar nerve decompression 06/2013 Repeat MRI left shoulder - subacromial/subdeltoid bursitis, a small intersubstance tear to the distal infraspinatus and evidence of interval resection of the distal clavicle 06/2013 - steroid injection of left biceps 09/2013 -Repeat EMG showed improvement of ulnar nerve compression 10/2013 - referred to [facility] for second opinion due to intractable pain 11/2013 - Seen by Dr. Marlou Sa at [facility]; discussed risks/benefits of surgical intervention and bursectomy, no plan for surgery at this time     . 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 Cancer Father        lung  . Heart disease Father   . Hypertension Mother   .  Seizures Neg Hx     Past Surgical History:  Procedure Laterality Date  . [place]   struck by golf ball-48 yrs old  . CRANIOTOMY     metal plate  . HIP PINNING,CANNULATED Right 06/27/2019   Procedure: HIP PERCUTANEOUS PINNING;  Surgeon: Meredith Pel, MD;  Location: [place];  Service: Orthopedics;  Laterality: Right;  . NASAL FRACTURE SURGERY  1997  . SACROILIAC JOINT FUSION Left 03/20/2017   Procedure: SACROILIAC JOINT FUSION;  Surgeon: Melina Schools, MD;  Location: [place];  Service: Orthopedics;  Laterality: Left;  90 mins  . SHOULDER ARTHROSCOPY Left    "cleaned arthritis out"  . SHOULDER SURGERY Right 2012   Rotator cuff surgery  . TONSILLECTOMY    . UVULOPALATOPHARYNGOPLASTY (UPPP)/TONSILLECTOMY/SEPTOPLASTY  2001   Social History   Occupational History  . Occupation: disabled  Tobacco Use  . Smoking  status: Former Smoker    Packs/day: 1.00    Years: 22.00    Pack years: 22.00    Types: Cigarettes    Quit date: 06/22/2012    Years since quitting: 7.0  . Smokeless tobacco: Current User    Types: Snuff  Substance and Sexual Activity  . Alcohol use: No    Alcohol/week: 0.0 standard drinks    Comment: none since 7/11  . Drug use: No  . Sexual activity: Never

## 2019-07-16 NOTE — Addendum Note (Signed)
Addended byLaurann Montana on: 07/16/2019 02:36 PM   Modules accepted: Orders

## 2019-07-17 ENCOUNTER — Other Ambulatory Visit: Payer: Self-pay | Admitting: Family Medicine

## 2019-07-19 DIAGNOSIS — Z471 Aftercare following joint replacement surgery: Secondary | ICD-10-CM | POA: Diagnosis not present

## 2019-07-19 DIAGNOSIS — Z96641 Presence of right artificial hip joint: Secondary | ICD-10-CM | POA: Diagnosis not present

## 2019-07-19 DIAGNOSIS — J4520 Mild intermittent asthma, uncomplicated: Secondary | ICD-10-CM | POA: Diagnosis not present

## 2019-07-19 DIAGNOSIS — E785 Hyperlipidemia, unspecified: Secondary | ICD-10-CM | POA: Diagnosis not present

## 2019-07-19 DIAGNOSIS — G2581 Restless legs syndrome: Secondary | ICD-10-CM | POA: Diagnosis not present

## 2019-07-19 DIAGNOSIS — Z7901 Long term (current) use of anticoagulants: Secondary | ICD-10-CM | POA: Diagnosis not present

## 2019-07-19 DIAGNOSIS — Z87891 Personal history of nicotine dependence: Secondary | ICD-10-CM | POA: Diagnosis not present

## 2019-07-19 DIAGNOSIS — M8580 Other specified disorders of bone density and structure, unspecified site: Secondary | ICD-10-CM | POA: Diagnosis not present

## 2019-07-19 DIAGNOSIS — M533 Sacrococcygeal disorders, not elsewhere classified: Secondary | ICD-10-CM | POA: Diagnosis not present

## 2019-07-19 DIAGNOSIS — G252 Other specified forms of tremor: Secondary | ICD-10-CM | POA: Diagnosis not present

## 2019-07-19 DIAGNOSIS — R208 Other disturbances of skin sensation: Secondary | ICD-10-CM | POA: Diagnosis not present

## 2019-07-19 DIAGNOSIS — G894 Chronic pain syndrome: Secondary | ICD-10-CM | POA: Diagnosis not present

## 2019-07-19 DIAGNOSIS — S72011D Unspecified intracapsular fracture of right femur, subsequent encounter for closed fracture with routine healing: Secondary | ICD-10-CM | POA: Diagnosis not present

## 2019-07-19 DIAGNOSIS — J302 Other seasonal allergic rhinitis: Secondary | ICD-10-CM | POA: Diagnosis not present

## 2019-07-19 DIAGNOSIS — M5136 Other intervertebral disc degeneration, lumbar region: Secondary | ICD-10-CM | POA: Diagnosis not present

## 2019-07-19 DIAGNOSIS — Z9181 History of falling: Secondary | ICD-10-CM | POA: Diagnosis not present

## 2019-07-19 DIAGNOSIS — K219 Gastro-esophageal reflux disease without esophagitis: Secondary | ICD-10-CM | POA: Diagnosis not present

## 2019-07-19 DIAGNOSIS — H9190 Unspecified hearing loss, unspecified ear: Secondary | ICD-10-CM | POA: Diagnosis not present

## 2019-07-19 DIAGNOSIS — Z87820 Personal history of traumatic brain injury: Secondary | ICD-10-CM | POA: Diagnosis not present

## 2019-07-19 DIAGNOSIS — R569 Unspecified convulsions: Secondary | ICD-10-CM | POA: Diagnosis not present

## 2019-07-19 DIAGNOSIS — I10 Essential (primary) hypertension: Secondary | ICD-10-CM | POA: Diagnosis not present

## 2019-07-19 DIAGNOSIS — G4700 Insomnia, unspecified: Secondary | ICD-10-CM | POA: Diagnosis not present

## 2019-07-19 DIAGNOSIS — M1990 Unspecified osteoarthritis, unspecified site: Secondary | ICD-10-CM | POA: Diagnosis not present

## 2019-07-20 DIAGNOSIS — G252 Other specified forms of tremor: Secondary | ICD-10-CM | POA: Diagnosis not present

## 2019-07-20 DIAGNOSIS — H9190 Unspecified hearing loss, unspecified ear: Secondary | ICD-10-CM | POA: Diagnosis not present

## 2019-07-20 DIAGNOSIS — E785 Hyperlipidemia, unspecified: Secondary | ICD-10-CM | POA: Diagnosis not present

## 2019-07-20 DIAGNOSIS — S72011D Unspecified intracapsular fracture of right femur, subsequent encounter for closed fracture with routine healing: Secondary | ICD-10-CM | POA: Diagnosis not present

## 2019-07-20 DIAGNOSIS — G2581 Restless legs syndrome: Secondary | ICD-10-CM | POA: Diagnosis not present

## 2019-07-20 DIAGNOSIS — M533 Sacrococcygeal disorders, not elsewhere classified: Secondary | ICD-10-CM | POA: Diagnosis not present

## 2019-07-20 DIAGNOSIS — I10 Essential (primary) hypertension: Secondary | ICD-10-CM | POA: Diagnosis not present

## 2019-07-20 DIAGNOSIS — Z7901 Long term (current) use of anticoagulants: Secondary | ICD-10-CM | POA: Diagnosis not present

## 2019-07-20 DIAGNOSIS — M8580 Other specified disorders of bone density and structure, unspecified site: Secondary | ICD-10-CM | POA: Diagnosis not present

## 2019-07-20 DIAGNOSIS — Z87891 Personal history of nicotine dependence: Secondary | ICD-10-CM | POA: Diagnosis not present

## 2019-07-20 DIAGNOSIS — J302 Other seasonal allergic rhinitis: Secondary | ICD-10-CM | POA: Diagnosis not present

## 2019-07-20 DIAGNOSIS — Z9181 History of falling: Secondary | ICD-10-CM | POA: Diagnosis not present

## 2019-07-20 DIAGNOSIS — G4700 Insomnia, unspecified: Secondary | ICD-10-CM | POA: Diagnosis not present

## 2019-07-20 DIAGNOSIS — R208 Other disturbances of skin sensation: Secondary | ICD-10-CM | POA: Diagnosis not present

## 2019-07-20 DIAGNOSIS — J4520 Mild intermittent asthma, uncomplicated: Secondary | ICD-10-CM | POA: Diagnosis not present

## 2019-07-20 DIAGNOSIS — R569 Unspecified convulsions: Secondary | ICD-10-CM | POA: Diagnosis not present

## 2019-07-20 DIAGNOSIS — Z471 Aftercare following joint replacement surgery: Secondary | ICD-10-CM | POA: Diagnosis not present

## 2019-07-20 DIAGNOSIS — K219 Gastro-esophageal reflux disease without esophagitis: Secondary | ICD-10-CM | POA: Diagnosis not present

## 2019-07-20 DIAGNOSIS — M1990 Unspecified osteoarthritis, unspecified site: Secondary | ICD-10-CM | POA: Diagnosis not present

## 2019-07-20 DIAGNOSIS — M5136 Other intervertebral disc degeneration, lumbar region: Secondary | ICD-10-CM | POA: Diagnosis not present

## 2019-07-20 DIAGNOSIS — Z87820 Personal history of traumatic brain injury: Secondary | ICD-10-CM | POA: Diagnosis not present

## 2019-07-20 DIAGNOSIS — Z96641 Presence of right artificial hip joint: Secondary | ICD-10-CM | POA: Diagnosis not present

## 2019-07-20 DIAGNOSIS — G894 Chronic pain syndrome: Secondary | ICD-10-CM | POA: Diagnosis not present

## 2019-07-21 ENCOUNTER — Telehealth: Payer: Self-pay | Admitting: Orthopedic Surgery

## 2019-07-21 NOTE — Telephone Encounter (Signed)
Marlowe Kays, OT with [facility], called. She would like verbal orders for patient to have OT 2x 2wk and 1x 3wk. Her call back number is [phone]

## 2019-07-21 NOTE — Telephone Encounter (Signed)
IC verbal given.  

## 2019-07-22 ENCOUNTER — Telehealth: Payer: Self-pay

## 2019-07-22 DIAGNOSIS — H9190 Unspecified hearing loss, unspecified ear: Secondary | ICD-10-CM | POA: Diagnosis not present

## 2019-07-22 DIAGNOSIS — L259 Unspecified contact dermatitis, unspecified cause: Secondary | ICD-10-CM

## 2019-07-22 DIAGNOSIS — G894 Chronic pain syndrome: Secondary | ICD-10-CM | POA: Diagnosis not present

## 2019-07-22 DIAGNOSIS — J4520 Mild intermittent asthma, uncomplicated: Secondary | ICD-10-CM | POA: Diagnosis not present

## 2019-07-22 DIAGNOSIS — Z9181 History of falling: Secondary | ICD-10-CM | POA: Diagnosis not present

## 2019-07-22 DIAGNOSIS — Z96641 Presence of right artificial hip joint: Secondary | ICD-10-CM | POA: Diagnosis not present

## 2019-07-22 DIAGNOSIS — M8580 Other specified disorders of bone density and structure, unspecified site: Secondary | ICD-10-CM | POA: Diagnosis not present

## 2019-07-22 DIAGNOSIS — Z87820 Personal history of traumatic brain injury: Secondary | ICD-10-CM | POA: Diagnosis not present

## 2019-07-22 DIAGNOSIS — I10 Essential (primary) hypertension: Secondary | ICD-10-CM | POA: Diagnosis not present

## 2019-07-22 DIAGNOSIS — M533 Sacrococcygeal disorders, not elsewhere classified: Secondary | ICD-10-CM | POA: Diagnosis not present

## 2019-07-22 DIAGNOSIS — Z87891 Personal history of nicotine dependence: Secondary | ICD-10-CM | POA: Diagnosis not present

## 2019-07-22 DIAGNOSIS — J302 Other seasonal allergic rhinitis: Secondary | ICD-10-CM | POA: Diagnosis not present

## 2019-07-22 DIAGNOSIS — M1990 Unspecified osteoarthritis, unspecified site: Secondary | ICD-10-CM | POA: Diagnosis not present

## 2019-07-22 DIAGNOSIS — G2581 Restless legs syndrome: Secondary | ICD-10-CM | POA: Diagnosis not present

## 2019-07-22 DIAGNOSIS — E785 Hyperlipidemia, unspecified: Secondary | ICD-10-CM | POA: Diagnosis not present

## 2019-07-22 DIAGNOSIS — K219 Gastro-esophageal reflux disease without esophagitis: Secondary | ICD-10-CM | POA: Diagnosis not present

## 2019-07-22 DIAGNOSIS — G252 Other specified forms of tremor: Secondary | ICD-10-CM | POA: Diagnosis not present

## 2019-07-22 DIAGNOSIS — R569 Unspecified convulsions: Secondary | ICD-10-CM | POA: Diagnosis not present

## 2019-07-22 DIAGNOSIS — S72011D Unspecified intracapsular fracture of right femur, subsequent encounter for closed fracture with routine healing: Secondary | ICD-10-CM | POA: Diagnosis not present

## 2019-07-22 DIAGNOSIS — G4700 Insomnia, unspecified: Secondary | ICD-10-CM | POA: Diagnosis not present

## 2019-07-22 DIAGNOSIS — M5136 Other intervertebral disc degeneration, lumbar region: Secondary | ICD-10-CM | POA: Diagnosis not present

## 2019-07-22 DIAGNOSIS — Z7901 Long term (current) use of anticoagulants: Secondary | ICD-10-CM | POA: Diagnosis not present

## 2019-07-22 DIAGNOSIS — Z471 Aftercare following joint replacement surgery: Secondary | ICD-10-CM | POA: Diagnosis not present

## 2019-07-22 DIAGNOSIS — R208 Other disturbances of skin sensation: Secondary | ICD-10-CM | POA: Diagnosis not present

## 2019-07-23 DIAGNOSIS — M533 Sacrococcygeal disorders, not elsewhere classified: Secondary | ICD-10-CM | POA: Diagnosis not present

## 2019-07-23 DIAGNOSIS — Z471 Aftercare following joint replacement surgery: Secondary | ICD-10-CM | POA: Diagnosis not present

## 2019-07-23 DIAGNOSIS — M8580 Other specified disorders of bone density and structure, unspecified site: Secondary | ICD-10-CM | POA: Diagnosis not present

## 2019-07-23 DIAGNOSIS — E785 Hyperlipidemia, unspecified: Secondary | ICD-10-CM | POA: Diagnosis not present

## 2019-07-23 DIAGNOSIS — S72011D Unspecified intracapsular fracture of right femur, subsequent encounter for closed fracture with routine healing: Secondary | ICD-10-CM | POA: Diagnosis not present

## 2019-07-23 DIAGNOSIS — G252 Other specified forms of tremor: Secondary | ICD-10-CM | POA: Diagnosis not present

## 2019-07-23 DIAGNOSIS — G894 Chronic pain syndrome: Secondary | ICD-10-CM | POA: Diagnosis not present

## 2019-07-23 DIAGNOSIS — M1990 Unspecified osteoarthritis, unspecified site: Secondary | ICD-10-CM | POA: Diagnosis not present

## 2019-07-23 DIAGNOSIS — Z87891 Personal history of nicotine dependence: Secondary | ICD-10-CM | POA: Diagnosis not present

## 2019-07-23 DIAGNOSIS — J302 Other seasonal allergic rhinitis: Secondary | ICD-10-CM | POA: Diagnosis not present

## 2019-07-23 DIAGNOSIS — I10 Essential (primary) hypertension: Secondary | ICD-10-CM | POA: Diagnosis not present

## 2019-07-23 DIAGNOSIS — Z9181 History of falling: Secondary | ICD-10-CM | POA: Diagnosis not present

## 2019-07-23 DIAGNOSIS — R208 Other disturbances of skin sensation: Secondary | ICD-10-CM | POA: Diagnosis not present

## 2019-07-23 DIAGNOSIS — H9190 Unspecified hearing loss, unspecified ear: Secondary | ICD-10-CM | POA: Diagnosis not present

## 2019-07-23 DIAGNOSIS — Z7901 Long term (current) use of anticoagulants: Secondary | ICD-10-CM | POA: Diagnosis not present

## 2019-07-23 DIAGNOSIS — R569 Unspecified convulsions: Secondary | ICD-10-CM | POA: Diagnosis not present

## 2019-07-23 DIAGNOSIS — Z87820 Personal history of traumatic brain injury: Secondary | ICD-10-CM | POA: Diagnosis not present

## 2019-07-23 DIAGNOSIS — K219 Gastro-esophageal reflux disease without esophagitis: Secondary | ICD-10-CM | POA: Diagnosis not present

## 2019-07-23 DIAGNOSIS — J4520 Mild intermittent asthma, uncomplicated: Secondary | ICD-10-CM | POA: Diagnosis not present

## 2019-07-23 DIAGNOSIS — G2581 Restless legs syndrome: Secondary | ICD-10-CM | POA: Diagnosis not present

## 2019-07-23 DIAGNOSIS — M5136 Other intervertebral disc degeneration, lumbar region: Secondary | ICD-10-CM | POA: Diagnosis not present

## 2019-07-23 DIAGNOSIS — G4700 Insomnia, unspecified: Secondary | ICD-10-CM | POA: Diagnosis not present

## 2019-07-23 DIAGNOSIS — Z96641 Presence of right artificial hip joint: Secondary | ICD-10-CM | POA: Diagnosis not present

## 2019-07-23 MED ORDER — TRIAMCINOLONE ACETONIDE 0.5 % EX OINT: TOPICAL_OINTMENT | CUTANEOUS | 2 refills | Status: DC

## 2019-07-23 NOTE — Telephone Encounter (Signed)
Triamcinolone 0.1% oint 30g  RFx2 for contact dermatitis and insect bites

## 2019-07-26 ENCOUNTER — Other Ambulatory Visit: Payer: Self-pay | Admitting: *Deleted

## 2019-07-26 DIAGNOSIS — I10 Essential (primary) hypertension: Secondary | ICD-10-CM | POA: Diagnosis not present

## 2019-07-26 DIAGNOSIS — Z96641 Presence of right artificial hip joint: Secondary | ICD-10-CM | POA: Diagnosis not present

## 2019-07-26 DIAGNOSIS — R569 Unspecified convulsions: Secondary | ICD-10-CM | POA: Diagnosis not present

## 2019-07-26 DIAGNOSIS — J4520 Mild intermittent asthma, uncomplicated: Secondary | ICD-10-CM | POA: Diagnosis not present

## 2019-07-26 DIAGNOSIS — G252 Other specified forms of tremor: Secondary | ICD-10-CM | POA: Diagnosis not present

## 2019-07-26 DIAGNOSIS — Z471 Aftercare following joint replacement surgery: Secondary | ICD-10-CM | POA: Diagnosis not present

## 2019-07-26 DIAGNOSIS — M8580 Other specified disorders of bone density and structure, unspecified site: Secondary | ICD-10-CM | POA: Diagnosis not present

## 2019-07-26 DIAGNOSIS — Z87891 Personal history of nicotine dependence: Secondary | ICD-10-CM | POA: Diagnosis not present

## 2019-07-26 DIAGNOSIS — J302 Other seasonal allergic rhinitis: Secondary | ICD-10-CM | POA: Diagnosis not present

## 2019-07-26 DIAGNOSIS — M1990 Unspecified osteoarthritis, unspecified site: Secondary | ICD-10-CM | POA: Diagnosis not present

## 2019-07-26 DIAGNOSIS — M533 Sacrococcygeal disorders, not elsewhere classified: Secondary | ICD-10-CM | POA: Diagnosis not present

## 2019-07-26 DIAGNOSIS — H9190 Unspecified hearing loss, unspecified ear: Secondary | ICD-10-CM | POA: Diagnosis not present

## 2019-07-26 DIAGNOSIS — Z7901 Long term (current) use of anticoagulants: Secondary | ICD-10-CM | POA: Diagnosis not present

## 2019-07-26 DIAGNOSIS — Z87820 Personal history of traumatic brain injury: Secondary | ICD-10-CM | POA: Diagnosis not present

## 2019-07-26 DIAGNOSIS — M5136 Other intervertebral disc degeneration, lumbar region: Secondary | ICD-10-CM | POA: Diagnosis not present

## 2019-07-26 DIAGNOSIS — K219 Gastro-esophageal reflux disease without esophagitis: Secondary | ICD-10-CM | POA: Diagnosis not present

## 2019-07-26 DIAGNOSIS — G4700 Insomnia, unspecified: Secondary | ICD-10-CM | POA: Diagnosis not present

## 2019-07-26 DIAGNOSIS — G894 Chronic pain syndrome: Secondary | ICD-10-CM | POA: Diagnosis not present

## 2019-07-26 DIAGNOSIS — Z9181 History of falling: Secondary | ICD-10-CM | POA: Diagnosis not present

## 2019-07-26 DIAGNOSIS — G2581 Restless legs syndrome: Secondary | ICD-10-CM | POA: Diagnosis not present

## 2019-07-26 DIAGNOSIS — E785 Hyperlipidemia, unspecified: Secondary | ICD-10-CM | POA: Diagnosis not present

## 2019-07-26 DIAGNOSIS — S72011D Unspecified intracapsular fracture of right femur, subsequent encounter for closed fracture with routine healing: Secondary | ICD-10-CM | POA: Diagnosis not present

## 2019-07-26 DIAGNOSIS — R208 Other disturbances of skin sensation: Secondary | ICD-10-CM | POA: Diagnosis not present

## 2019-07-26 NOTE — Telephone Encounter (Signed)
Pharmacy is requesting a change in strength of triamcinolone ointment but provider has already sent script on 07-23-2019 for a higher strength.  Klyde Banka,CMA

## 2019-07-27 DIAGNOSIS — Z96641 Presence of right artificial hip joint: Secondary | ICD-10-CM | POA: Diagnosis not present

## 2019-07-27 DIAGNOSIS — J4520 Mild intermittent asthma, uncomplicated: Secondary | ICD-10-CM | POA: Diagnosis not present

## 2019-07-27 DIAGNOSIS — M8580 Other specified disorders of bone density and structure, unspecified site: Secondary | ICD-10-CM | POA: Diagnosis not present

## 2019-07-27 DIAGNOSIS — Z9181 History of falling: Secondary | ICD-10-CM | POA: Diagnosis not present

## 2019-07-27 DIAGNOSIS — J302 Other seasonal allergic rhinitis: Secondary | ICD-10-CM | POA: Diagnosis not present

## 2019-07-27 DIAGNOSIS — Z471 Aftercare following joint replacement surgery: Secondary | ICD-10-CM | POA: Diagnosis not present

## 2019-07-27 DIAGNOSIS — I10 Essential (primary) hypertension: Secondary | ICD-10-CM | POA: Diagnosis not present

## 2019-07-27 DIAGNOSIS — R569 Unspecified convulsions: Secondary | ICD-10-CM | POA: Diagnosis not present

## 2019-07-27 DIAGNOSIS — K219 Gastro-esophageal reflux disease without esophagitis: Secondary | ICD-10-CM | POA: Diagnosis not present

## 2019-07-27 DIAGNOSIS — Z87891 Personal history of nicotine dependence: Secondary | ICD-10-CM | POA: Diagnosis not present

## 2019-07-27 DIAGNOSIS — Z87820 Personal history of traumatic brain injury: Secondary | ICD-10-CM | POA: Diagnosis not present

## 2019-07-27 DIAGNOSIS — M533 Sacrococcygeal disorders, not elsewhere classified: Secondary | ICD-10-CM | POA: Diagnosis not present

## 2019-07-27 DIAGNOSIS — G2581 Restless legs syndrome: Secondary | ICD-10-CM | POA: Diagnosis not present

## 2019-07-27 DIAGNOSIS — M1990 Unspecified osteoarthritis, unspecified site: Secondary | ICD-10-CM | POA: Diagnosis not present

## 2019-07-27 DIAGNOSIS — G252 Other specified forms of tremor: Secondary | ICD-10-CM | POA: Diagnosis not present

## 2019-07-27 DIAGNOSIS — E785 Hyperlipidemia, unspecified: Secondary | ICD-10-CM | POA: Diagnosis not present

## 2019-07-27 DIAGNOSIS — M5136 Other intervertebral disc degeneration, lumbar region: Secondary | ICD-10-CM | POA: Diagnosis not present

## 2019-07-27 DIAGNOSIS — S72011D Unspecified intracapsular fracture of right femur, subsequent encounter for closed fracture with routine healing: Secondary | ICD-10-CM | POA: Diagnosis not present

## 2019-07-27 DIAGNOSIS — Z7901 Long term (current) use of anticoagulants: Secondary | ICD-10-CM | POA: Diagnosis not present

## 2019-07-27 DIAGNOSIS — G4700 Insomnia, unspecified: Secondary | ICD-10-CM | POA: Diagnosis not present

## 2019-07-27 DIAGNOSIS — H9190 Unspecified hearing loss, unspecified ear: Secondary | ICD-10-CM | POA: Diagnosis not present

## 2019-07-27 DIAGNOSIS — G894 Chronic pain syndrome: Secondary | ICD-10-CM | POA: Diagnosis not present

## 2019-07-27 DIAGNOSIS — R208 Other disturbances of skin sensation: Secondary | ICD-10-CM | POA: Diagnosis not present

## 2019-07-27 MED ORDER — MELOXICAM 15 MG PO TABS: 15.0000 mg | ORAL_TABLET | Freq: Every day | ORAL | 5 refills | Status: DC

## 2019-07-29 DIAGNOSIS — G894 Chronic pain syndrome: Secondary | ICD-10-CM | POA: Diagnosis not present

## 2019-07-29 DIAGNOSIS — G2581 Restless legs syndrome: Secondary | ICD-10-CM | POA: Diagnosis not present

## 2019-07-29 DIAGNOSIS — Z7901 Long term (current) use of anticoagulants: Secondary | ICD-10-CM | POA: Diagnosis not present

## 2019-07-29 DIAGNOSIS — J302 Other seasonal allergic rhinitis: Secondary | ICD-10-CM | POA: Diagnosis not present

## 2019-07-29 DIAGNOSIS — R208 Other disturbances of skin sensation: Secondary | ICD-10-CM | POA: Diagnosis not present

## 2019-07-29 DIAGNOSIS — Z87891 Personal history of nicotine dependence: Secondary | ICD-10-CM | POA: Diagnosis not present

## 2019-07-29 DIAGNOSIS — K219 Gastro-esophageal reflux disease without esophagitis: Secondary | ICD-10-CM | POA: Diagnosis not present

## 2019-07-29 DIAGNOSIS — R569 Unspecified convulsions: Secondary | ICD-10-CM | POA: Diagnosis not present

## 2019-07-29 DIAGNOSIS — I10 Essential (primary) hypertension: Secondary | ICD-10-CM | POA: Diagnosis not present

## 2019-07-29 DIAGNOSIS — H9190 Unspecified hearing loss, unspecified ear: Secondary | ICD-10-CM | POA: Diagnosis not present

## 2019-07-29 DIAGNOSIS — M8580 Other specified disorders of bone density and structure, unspecified site: Secondary | ICD-10-CM | POA: Diagnosis not present

## 2019-07-29 DIAGNOSIS — G4700 Insomnia, unspecified: Secondary | ICD-10-CM | POA: Diagnosis not present

## 2019-07-29 DIAGNOSIS — E785 Hyperlipidemia, unspecified: Secondary | ICD-10-CM | POA: Diagnosis not present

## 2019-07-29 DIAGNOSIS — G252 Other specified forms of tremor: Secondary | ICD-10-CM | POA: Diagnosis not present

## 2019-07-29 DIAGNOSIS — M533 Sacrococcygeal disorders, not elsewhere classified: Secondary | ICD-10-CM | POA: Diagnosis not present

## 2019-07-29 DIAGNOSIS — Z9181 History of falling: Secondary | ICD-10-CM | POA: Diagnosis not present

## 2019-07-29 DIAGNOSIS — Z87820 Personal history of traumatic brain injury: Secondary | ICD-10-CM | POA: Diagnosis not present

## 2019-07-29 DIAGNOSIS — Z471 Aftercare following joint replacement surgery: Secondary | ICD-10-CM | POA: Diagnosis not present

## 2019-07-29 DIAGNOSIS — M1990 Unspecified osteoarthritis, unspecified site: Secondary | ICD-10-CM | POA: Diagnosis not present

## 2019-07-29 DIAGNOSIS — M5136 Other intervertebral disc degeneration, lumbar region: Secondary | ICD-10-CM | POA: Diagnosis not present

## 2019-07-29 DIAGNOSIS — S72011D Unspecified intracapsular fracture of right femur, subsequent encounter for closed fracture with routine healing: Secondary | ICD-10-CM | POA: Diagnosis not present

## 2019-07-29 DIAGNOSIS — J4520 Mild intermittent asthma, uncomplicated: Secondary | ICD-10-CM | POA: Diagnosis not present

## 2019-07-29 DIAGNOSIS — Z96641 Presence of right artificial hip joint: Secondary | ICD-10-CM | POA: Diagnosis not present

## 2019-07-30 ENCOUNTER — Other Ambulatory Visit: Payer: Self-pay

## 2019-07-30 MED ORDER — HYDROCODONE-ACETAMINOPHEN 7.5-325 MG PO TABS: 1.0000 | ORAL_TABLET | Freq: Four times a day (QID) | ORAL | 0 refills | Status: DC | PRN

## 2019-08-03 DIAGNOSIS — G4700 Insomnia, unspecified: Secondary | ICD-10-CM | POA: Diagnosis not present

## 2019-08-03 DIAGNOSIS — R208 Other disturbances of skin sensation: Secondary | ICD-10-CM | POA: Diagnosis not present

## 2019-08-03 DIAGNOSIS — Z96641 Presence of right artificial hip joint: Secondary | ICD-10-CM | POA: Diagnosis not present

## 2019-08-03 DIAGNOSIS — Z7901 Long term (current) use of anticoagulants: Secondary | ICD-10-CM | POA: Diagnosis not present

## 2019-08-03 DIAGNOSIS — K219 Gastro-esophageal reflux disease without esophagitis: Secondary | ICD-10-CM | POA: Diagnosis not present

## 2019-08-03 DIAGNOSIS — Z9181 History of falling: Secondary | ICD-10-CM | POA: Diagnosis not present

## 2019-08-03 DIAGNOSIS — M8580 Other specified disorders of bone density and structure, unspecified site: Secondary | ICD-10-CM | POA: Diagnosis not present

## 2019-08-03 DIAGNOSIS — I10 Essential (primary) hypertension: Secondary | ICD-10-CM | POA: Diagnosis not present

## 2019-08-03 DIAGNOSIS — G894 Chronic pain syndrome: Secondary | ICD-10-CM | POA: Diagnosis not present

## 2019-08-03 DIAGNOSIS — G252 Other specified forms of tremor: Secondary | ICD-10-CM | POA: Diagnosis not present

## 2019-08-03 DIAGNOSIS — Z471 Aftercare following joint replacement surgery: Secondary | ICD-10-CM | POA: Diagnosis not present

## 2019-08-03 DIAGNOSIS — Z87820 Personal history of traumatic brain injury: Secondary | ICD-10-CM | POA: Diagnosis not present

## 2019-08-03 DIAGNOSIS — G2581 Restless legs syndrome: Secondary | ICD-10-CM | POA: Diagnosis not present

## 2019-08-03 DIAGNOSIS — M1990 Unspecified osteoarthritis, unspecified site: Secondary | ICD-10-CM | POA: Diagnosis not present

## 2019-08-03 DIAGNOSIS — H9190 Unspecified hearing loss, unspecified ear: Secondary | ICD-10-CM | POA: Diagnosis not present

## 2019-08-03 DIAGNOSIS — R569 Unspecified convulsions: Secondary | ICD-10-CM | POA: Diagnosis not present

## 2019-08-03 DIAGNOSIS — M5136 Other intervertebral disc degeneration, lumbar region: Secondary | ICD-10-CM | POA: Diagnosis not present

## 2019-08-03 DIAGNOSIS — Z87891 Personal history of nicotine dependence: Secondary | ICD-10-CM | POA: Diagnosis not present

## 2019-08-03 DIAGNOSIS — J302 Other seasonal allergic rhinitis: Secondary | ICD-10-CM | POA: Diagnosis not present

## 2019-08-03 DIAGNOSIS — M533 Sacrococcygeal disorders, not elsewhere classified: Secondary | ICD-10-CM | POA: Diagnosis not present

## 2019-08-03 DIAGNOSIS — E785 Hyperlipidemia, unspecified: Secondary | ICD-10-CM | POA: Diagnosis not present

## 2019-08-03 DIAGNOSIS — S72011D Unspecified intracapsular fracture of right femur, subsequent encounter for closed fracture with routine healing: Secondary | ICD-10-CM | POA: Diagnosis not present

## 2019-08-03 DIAGNOSIS — J4520 Mild intermittent asthma, uncomplicated: Secondary | ICD-10-CM | POA: Diagnosis not present

## 2019-08-04 ENCOUNTER — Encounter: Payer: Self-pay | Admitting: Orthopedic Surgery

## 2019-08-04 ENCOUNTER — Ambulatory Visit (INDEPENDENT_AMBULATORY_CARE_PROVIDER_SITE_OTHER): Payer: Medicare Other | Admitting: Orthopedic Surgery

## 2019-08-04 ENCOUNTER — Other Ambulatory Visit: Payer: Self-pay

## 2019-08-04 ENCOUNTER — Ambulatory Visit (INDEPENDENT_AMBULATORY_CARE_PROVIDER_SITE_OTHER): Payer: Medicare Other

## 2019-08-04 DIAGNOSIS — Z8781 Personal history of (healed) traumatic fracture: Secondary | ICD-10-CM

## 2019-08-04 MED ORDER — METHOCARBAMOL 500 MG PO TABS: 500.0000 mg | ORAL_TABLET | Freq: Three times a day (TID) | ORAL | 0 refills | Status: DC | PRN

## 2019-08-04 NOTE — Addendum Note (Signed)
Addended byLaurann Montana on: 08/04/2019 03:04 PM   Modules accepted: Orders

## 2019-08-04 NOTE — Progress Notes (Signed)
Post-Op Visit Note   Patient: Richard Glass           Date of Birth: Jan 29, 1971           MRN: [id number] Visit Date: 08/04/2019 PCP: [name], [name], MD   Assessment & Plan:  Chief Complaint:  Chief Complaint  Patient presents with  . Right Hip - Routine Post Op   Visit Diagnoses:  1. S/P right hip fracture     Plan: Richard Glass is a patient is now 6 weeks out right hip pinning for fracture.  He has been doing reasonably well but still has pain with standing.  On exam there is some pain with rotation of the hip.  Incision is intact.  Pedal pulses are diminished in both feet.  Would like to get ABIs to check that out.  Also has a little calf atrophy on the right compared to the left which may be from nonweightbearing.  For now the plan is to see how this fracture does in terms of completing its healing over the next 6 weeks.  If it does not heal then he may need total hip replacement.  He will need x-rays on return.  Muscle relaxer refilled.  Follow-Up Instructions: Return in about 6 weeks (around 09/15/2019).   Orders:  Orders Placed This Encounter  Procedures  . XR HIP UNILAT W OR W/O PELVIS 2-3 VIEWS RIGHT   Meds ordered this encounter  Medications  . methocarbamol (ROBAXIN) 500 MG tablet    Sig: Take 1 tablet (500 mg total) by mouth every 8 (eight) hours as needed for muscle spasms.    Dispense:  30 tablet    Refill:  0    Imaging: Xr Hip Unilat W Or W/o Pelvis 2-3 Views Right  Result Date: 08/04/2019 AP pelvis lateral right hip reviewed.  Several millimeters of fracture settling has occurred in the subcapital femoral neck fracture.  Hardware still is in good position alignment with about 2 mm of backing out compared to radiographs from 4 weeks ago.  No evidence of collapse or AVN.   PMFS History: Patient Active Problem List   Diagnosis Date Noted  . S/P right hip fracture 07/07/2019  . Right leg pain 06/26/2019  . Femoral fracture ([place]) 06/26/2019  . Hearing  loss 05/01/2019  . Burn from the sun 04/20/2019  . Chronic left leg pain 11/20/2018  . Allodynia, left leg 11/20/2018  . Left foot pain 08/21/2018  . Hamstring muscle strain, left, initial encounter 06/26/2018  . History of traumatic brain injury 02/23/2018  . Contact dermatitis or eczema 02/13/2018  . High risk medication use, Combination of Opioid and Benzodiazapine 02/12/2018  . 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 SI (sacroiliac) pain 03/20/2017  . Rash 10/10/2016  . Enchondroma of left fibular head 05/17/2016  . Chronic pain syndrome 01/18/2016  . Insect bites 01/18/2016  . Anxiety state 08/08/2013  . Insomnia 02/02/2013  . Right flank pain 05/21/2011  . ASTHMA, INTERMITTENT 07/08/2010  . Intention tremor 10/31/2009  . Encounter for chronic pain management 07/04/2009  . Overweight(278.02) 06/13/2009  . Restless leg 09/29/2007  . Hyperlipidemia 11/13/2006  . Allergic rhinitis 11/13/2006  . GASTROESOPHAGEAL REFLUX, NO ESOPHAGITIS 11/13/2006  . Low back pain 11/13/2006  . Convulsions ([place]) 11/13/2006   Past Medical History:  Diagnosis Date  . 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  . Allergic rhinitis 11/13/2006       .  Anxiety   . Anxiety state 08/08/2013  . Asthma   . ASTHMA, INTERMITTENT 07/08/2010   Qualifier: Diagnosis of  By: Walker Kehr MD, Patrick Jupiter    . Back pain of lumbar region with sciatica 11/13/2006   MRI 12/2014 - lumbar DDD without focal neural impingement  MRI Lumbar 10/30/16 ([place]) Small central L5-S1 disc extrusion with minimal cranial migration and associated annular fissure approaches descending left S1 nerve roots without contact or displacement. Minimal bulging disc height loss without spinal canal stenosis or neural foraminal narrowing.  Minimal bulging disc at L2-L3 through L4-L5 without spinal canal stenosis or neural foraminal  narrowing.  MRI Sacrum 10/30/16 ([place]) No fracture. Small lesion left sacral ala most likely a subchondral cyst/erosion adjacent to the sacroiliac joint.     . 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 Carpal tunnel syndrome 01/09/2015   Left 12/2014   . Cervical spine pain 02/06/2015   MRI 03/2015 FINDINGS: Vertebral body height, signal and alignment are normal. The craniocervical junction is normal and cervical cord signal is normal. The central spinal canal and neural foramina are widely patent at all levels. Scattered, mild degenerative change appears most notable at C4-5. Imaged paraspinous structures are unremarkable.  IMPRESSION: Negative for central canal or foraminal narrowing. No finding to explain the patient's symptoms. Scattered, mild facet degenerative disease is noted.   . Chronic low back pain   . Chronic pain 01/18/2016  . Chronic pain syndrome 01/18/2016  . Coccyx pain 02/06/2015  . Convulsions ([place]) 11/13/2006     Cystic encephalomalacia left temporal and parietal regions from remote injury.   . Degenerative arthritis   . Dyslipidemia   . External hemorrhoid, bleeding 06/04/2011  . GASTROESOPHAGEAL REFLUX, NO ESOPHAGITIS 11/13/2006   Qualifier: Diagnosis of  By: Eusebio Friendly    . Headache(784.0)   . Hyperlipidemia 11/13/2006   07/2016  ASCVD score of 4.7% - recommended lifestyle changes   . Insomnia 02/02/2013  . Intention tremor 10/31/2009   Qualifier: Diagnosis of  By: Walker Kehr MD, Patrick Jupiter    . Intercostal pain 04/25/2015  . Left knee injury 05/09/2016  . 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 Morton's neuroma of left foot 01/18/2016  . Osteopenia 10/10/2016  . Pain in joint, shoulder region 07/05/2014   LEFT > Right Reports hx rotator cuff surgery on rigt shoulder previously (Dr Nicholes Stairs) but no notes available XRAY right shoulder showed some proliferative changes distal rigt clavicle   . Rash and nonspecific skin eruption 10/10/2016  . Restless leg  09/29/2007   Qualifier: Diagnosis of  By: Walker Kehr MD, Patrick Jupiter    . Restless legs syndrome (RLS)   . Sciatica of left side 10/26/2014  . Seizures ([place])    last >76yrs  . SI (sacroiliac) pain 03/20/2017  . Sinusitis chronic, frontal   . Status post lumbar spinal fusion 03/20/2017  . Subacromial or subdeltoid bursitis 08/08/2009   MRI C-spine 2011([facility]) - normal MRI left shoulder 10/2010 ([facility]) - AC degenerative disease, normal rotator cuff 12/2012 -debridement, acromioplasty and distal clavicle excision of left shoulder, arthroscopy also performed an no need for rotator cuff repair - performed by [facility] 02/2013 - Nerve Conduction Studies ([facility]) - ulnar nerve compression 02/2013- taken back to OR for Ulnar nerve decompression 06/2013 Repeat MRI left shoulder - subacromial/subdeltoid bursitis, a small intersubstance tear to the distal infraspinatus and evidence of interval resection of the distal clavicle 06/2013 - steroid injection of left biceps 09/2013 -Repeat  EMG showed improvement of ulnar nerve compression 10/2013 - referred to [facility] for second opinion due to intractable pain 11/2013 - Seen by Dr. Marlou Sa at [facility]; discussed risks/benefits of surgical intervention and bursectomy, no plan for surgery at this time     . 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 Cancer Father        lung  . Heart disease Father   . Hypertension Mother   . Seizures Neg Hx     Past Surgical History:  Procedure Laterality Date  . [place]   struck by golf ball-48 yrs old  . CRANIOTOMY     metal plate  . HIP PINNING,CANNULATED Right 06/27/2019   Procedure: HIP PERCUTANEOUS PINNING;  Surgeon: Meredith Pel, MD;  Location: [place];  Service: Orthopedics;  Laterality: Right;  . NASAL FRACTURE SURGERY  1997   . SACROILIAC JOINT FUSION Left 03/20/2017   Procedure: SACROILIAC JOINT FUSION;  Surgeon: Melina Schools, MD;  Location: [place];  Service: Orthopedics;  Laterality: Left;  90 mins  . SHOULDER ARTHROSCOPY Left    "cleaned arthritis out"  . SHOULDER SURGERY Right 2012   Rotator cuff surgery  . TONSILLECTOMY    . UVULOPALATOPHARYNGOPLASTY (UPPP)/TONSILLECTOMY/SEPTOPLASTY  2001   Social History   Occupational History  . Occupation: disabled  Tobacco Use  . Smoking status: Former Smoker    Packs/day: 1.00    Years: 22.00    Pack years: 22.00    Types: Cigarettes    Quit date: 06/22/2012    Years since quitting: 7.1  . Smokeless tobacco: Current User    Types: Snuff  Substance and Sexual Activity  . Alcohol use: No    Alcohol/week: 0.0 standard drinks    Comment: none since 7/11  . Drug use: No  . Sexual activity: Never

## 2019-08-09 DIAGNOSIS — Z471 Aftercare following joint replacement surgery: Secondary | ICD-10-CM | POA: Diagnosis not present

## 2019-08-09 DIAGNOSIS — M533 Sacrococcygeal disorders, not elsewhere classified: Secondary | ICD-10-CM | POA: Diagnosis not present

## 2019-08-09 DIAGNOSIS — J302 Other seasonal allergic rhinitis: Secondary | ICD-10-CM | POA: Diagnosis not present

## 2019-08-09 DIAGNOSIS — M1990 Unspecified osteoarthritis, unspecified site: Secondary | ICD-10-CM | POA: Diagnosis not present

## 2019-08-09 DIAGNOSIS — Z7901 Long term (current) use of anticoagulants: Secondary | ICD-10-CM | POA: Diagnosis not present

## 2019-08-09 DIAGNOSIS — G4700 Insomnia, unspecified: Secondary | ICD-10-CM | POA: Diagnosis not present

## 2019-08-09 DIAGNOSIS — R208 Other disturbances of skin sensation: Secondary | ICD-10-CM | POA: Diagnosis not present

## 2019-08-09 DIAGNOSIS — G894 Chronic pain syndrome: Secondary | ICD-10-CM | POA: Diagnosis not present

## 2019-08-09 DIAGNOSIS — H9190 Unspecified hearing loss, unspecified ear: Secondary | ICD-10-CM | POA: Diagnosis not present

## 2019-08-09 DIAGNOSIS — R569 Unspecified convulsions: Secondary | ICD-10-CM | POA: Diagnosis not present

## 2019-08-09 DIAGNOSIS — Z96641 Presence of right artificial hip joint: Secondary | ICD-10-CM | POA: Diagnosis not present

## 2019-08-09 DIAGNOSIS — S72011D Unspecified intracapsular fracture of right femur, subsequent encounter for closed fracture with routine healing: Secondary | ICD-10-CM | POA: Diagnosis not present

## 2019-08-09 DIAGNOSIS — M5136 Other intervertebral disc degeneration, lumbar region: Secondary | ICD-10-CM | POA: Diagnosis not present

## 2019-08-09 DIAGNOSIS — Z87820 Personal history of traumatic brain injury: Secondary | ICD-10-CM | POA: Diagnosis not present

## 2019-08-09 DIAGNOSIS — I10 Essential (primary) hypertension: Secondary | ICD-10-CM | POA: Diagnosis not present

## 2019-08-09 DIAGNOSIS — E785 Hyperlipidemia, unspecified: Secondary | ICD-10-CM | POA: Diagnosis not present

## 2019-08-09 DIAGNOSIS — G2581 Restless legs syndrome: Secondary | ICD-10-CM | POA: Diagnosis not present

## 2019-08-09 DIAGNOSIS — K219 Gastro-esophageal reflux disease without esophagitis: Secondary | ICD-10-CM | POA: Diagnosis not present

## 2019-08-09 DIAGNOSIS — Z87891 Personal history of nicotine dependence: Secondary | ICD-10-CM | POA: Diagnosis not present

## 2019-08-09 DIAGNOSIS — Z9181 History of falling: Secondary | ICD-10-CM | POA: Diagnosis not present

## 2019-08-09 DIAGNOSIS — G252 Other specified forms of tremor: Secondary | ICD-10-CM | POA: Diagnosis not present

## 2019-08-09 DIAGNOSIS — M8580 Other specified disorders of bone density and structure, unspecified site: Secondary | ICD-10-CM | POA: Diagnosis not present

## 2019-08-09 DIAGNOSIS — J4520 Mild intermittent asthma, uncomplicated: Secondary | ICD-10-CM | POA: Diagnosis not present

## 2019-08-10 DIAGNOSIS — G252 Other specified forms of tremor: Secondary | ICD-10-CM | POA: Diagnosis not present

## 2019-08-10 DIAGNOSIS — G4700 Insomnia, unspecified: Secondary | ICD-10-CM | POA: Diagnosis not present

## 2019-08-10 DIAGNOSIS — M5136 Other intervertebral disc degeneration, lumbar region: Secondary | ICD-10-CM | POA: Diagnosis not present

## 2019-08-10 DIAGNOSIS — Z96641 Presence of right artificial hip joint: Secondary | ICD-10-CM | POA: Diagnosis not present

## 2019-08-10 DIAGNOSIS — G894 Chronic pain syndrome: Secondary | ICD-10-CM | POA: Diagnosis not present

## 2019-08-10 DIAGNOSIS — S72011D Unspecified intracapsular fracture of right femur, subsequent encounter for closed fracture with routine healing: Secondary | ICD-10-CM | POA: Diagnosis not present

## 2019-08-10 DIAGNOSIS — J4520 Mild intermittent asthma, uncomplicated: Secondary | ICD-10-CM | POA: Diagnosis not present

## 2019-08-10 DIAGNOSIS — M1990 Unspecified osteoarthritis, unspecified site: Secondary | ICD-10-CM | POA: Diagnosis not present

## 2019-08-10 DIAGNOSIS — Z87891 Personal history of nicotine dependence: Secondary | ICD-10-CM | POA: Diagnosis not present

## 2019-08-10 DIAGNOSIS — M533 Sacrococcygeal disorders, not elsewhere classified: Secondary | ICD-10-CM | POA: Diagnosis not present

## 2019-08-10 DIAGNOSIS — Z471 Aftercare following joint replacement surgery: Secondary | ICD-10-CM | POA: Diagnosis not present

## 2019-08-10 DIAGNOSIS — Z87820 Personal history of traumatic brain injury: Secondary | ICD-10-CM | POA: Diagnosis not present

## 2019-08-10 DIAGNOSIS — R569 Unspecified convulsions: Secondary | ICD-10-CM | POA: Diagnosis not present

## 2019-08-10 DIAGNOSIS — I10 Essential (primary) hypertension: Secondary | ICD-10-CM | POA: Diagnosis not present

## 2019-08-10 DIAGNOSIS — Z7901 Long term (current) use of anticoagulants: Secondary | ICD-10-CM | POA: Diagnosis not present

## 2019-08-10 DIAGNOSIS — M8580 Other specified disorders of bone density and structure, unspecified site: Secondary | ICD-10-CM | POA: Diagnosis not present

## 2019-08-10 DIAGNOSIS — R208 Other disturbances of skin sensation: Secondary | ICD-10-CM | POA: Diagnosis not present

## 2019-08-10 DIAGNOSIS — Z9181 History of falling: Secondary | ICD-10-CM | POA: Diagnosis not present

## 2019-08-10 DIAGNOSIS — H9190 Unspecified hearing loss, unspecified ear: Secondary | ICD-10-CM | POA: Diagnosis not present

## 2019-08-10 DIAGNOSIS — G2581 Restless legs syndrome: Secondary | ICD-10-CM | POA: Diagnosis not present

## 2019-08-10 DIAGNOSIS — J302 Other seasonal allergic rhinitis: Secondary | ICD-10-CM | POA: Diagnosis not present

## 2019-08-10 DIAGNOSIS — K219 Gastro-esophageal reflux disease without esophagitis: Secondary | ICD-10-CM | POA: Diagnosis not present

## 2019-08-10 DIAGNOSIS — E785 Hyperlipidemia, unspecified: Secondary | ICD-10-CM | POA: Diagnosis not present

## 2019-08-17 DIAGNOSIS — E785 Hyperlipidemia, unspecified: Secondary | ICD-10-CM | POA: Diagnosis not present

## 2019-08-17 DIAGNOSIS — I10 Essential (primary) hypertension: Secondary | ICD-10-CM | POA: Diagnosis not present

## 2019-08-17 DIAGNOSIS — Z471 Aftercare following joint replacement surgery: Secondary | ICD-10-CM | POA: Diagnosis not present

## 2019-08-17 DIAGNOSIS — G2581 Restless legs syndrome: Secondary | ICD-10-CM | POA: Diagnosis not present

## 2019-08-17 DIAGNOSIS — M533 Sacrococcygeal disorders, not elsewhere classified: Secondary | ICD-10-CM | POA: Diagnosis not present

## 2019-08-17 DIAGNOSIS — J302 Other seasonal allergic rhinitis: Secondary | ICD-10-CM | POA: Diagnosis not present

## 2019-08-17 DIAGNOSIS — G252 Other specified forms of tremor: Secondary | ICD-10-CM | POA: Diagnosis not present

## 2019-08-17 DIAGNOSIS — S72011D Unspecified intracapsular fracture of right femur, subsequent encounter for closed fracture with routine healing: Secondary | ICD-10-CM | POA: Diagnosis not present

## 2019-08-17 DIAGNOSIS — H9190 Unspecified hearing loss, unspecified ear: Secondary | ICD-10-CM | POA: Diagnosis not present

## 2019-08-17 DIAGNOSIS — G4700 Insomnia, unspecified: Secondary | ICD-10-CM | POA: Diagnosis not present

## 2019-08-17 DIAGNOSIS — Z96641 Presence of right artificial hip joint: Secondary | ICD-10-CM | POA: Diagnosis not present

## 2019-08-17 DIAGNOSIS — Z7901 Long term (current) use of anticoagulants: Secondary | ICD-10-CM | POA: Diagnosis not present

## 2019-08-17 DIAGNOSIS — M5136 Other intervertebral disc degeneration, lumbar region: Secondary | ICD-10-CM | POA: Diagnosis not present

## 2019-08-17 DIAGNOSIS — M8580 Other specified disorders of bone density and structure, unspecified site: Secondary | ICD-10-CM | POA: Diagnosis not present

## 2019-08-17 DIAGNOSIS — G894 Chronic pain syndrome: Secondary | ICD-10-CM | POA: Diagnosis not present

## 2019-08-17 DIAGNOSIS — Z87820 Personal history of traumatic brain injury: Secondary | ICD-10-CM | POA: Diagnosis not present

## 2019-08-17 DIAGNOSIS — M1990 Unspecified osteoarthritis, unspecified site: Secondary | ICD-10-CM | POA: Diagnosis not present

## 2019-08-17 DIAGNOSIS — R208 Other disturbances of skin sensation: Secondary | ICD-10-CM | POA: Diagnosis not present

## 2019-08-17 DIAGNOSIS — R569 Unspecified convulsions: Secondary | ICD-10-CM | POA: Diagnosis not present

## 2019-08-17 DIAGNOSIS — Z9181 History of falling: Secondary | ICD-10-CM | POA: Diagnosis not present

## 2019-08-17 DIAGNOSIS — Z87891 Personal history of nicotine dependence: Secondary | ICD-10-CM | POA: Diagnosis not present

## 2019-08-17 DIAGNOSIS — K219 Gastro-esophageal reflux disease without esophagitis: Secondary | ICD-10-CM | POA: Diagnosis not present

## 2019-08-17 DIAGNOSIS — J4520 Mild intermittent asthma, uncomplicated: Secondary | ICD-10-CM | POA: Diagnosis not present

## 2019-08-18 DIAGNOSIS — I10 Essential (primary) hypertension: Secondary | ICD-10-CM | POA: Diagnosis not present

## 2019-08-18 DIAGNOSIS — Z471 Aftercare following joint replacement surgery: Secondary | ICD-10-CM | POA: Diagnosis not present

## 2019-08-18 DIAGNOSIS — M533 Sacrococcygeal disorders, not elsewhere classified: Secondary | ICD-10-CM | POA: Diagnosis not present

## 2019-08-18 DIAGNOSIS — R569 Unspecified convulsions: Secondary | ICD-10-CM | POA: Diagnosis not present

## 2019-08-18 DIAGNOSIS — S72011D Unspecified intracapsular fracture of right femur, subsequent encounter for closed fracture with routine healing: Secondary | ICD-10-CM | POA: Diagnosis not present

## 2019-08-18 DIAGNOSIS — G4700 Insomnia, unspecified: Secondary | ICD-10-CM | POA: Diagnosis not present

## 2019-08-18 DIAGNOSIS — E785 Hyperlipidemia, unspecified: Secondary | ICD-10-CM | POA: Diagnosis not present

## 2019-08-18 DIAGNOSIS — Z7901 Long term (current) use of anticoagulants: Secondary | ICD-10-CM | POA: Diagnosis not present

## 2019-08-18 DIAGNOSIS — G2581 Restless legs syndrome: Secondary | ICD-10-CM | POA: Diagnosis not present

## 2019-08-18 DIAGNOSIS — Z96641 Presence of right artificial hip joint: Secondary | ICD-10-CM | POA: Diagnosis not present

## 2019-08-18 DIAGNOSIS — H9190 Unspecified hearing loss, unspecified ear: Secondary | ICD-10-CM | POA: Diagnosis not present

## 2019-08-18 DIAGNOSIS — Z87891 Personal history of nicotine dependence: Secondary | ICD-10-CM | POA: Diagnosis not present

## 2019-08-18 DIAGNOSIS — K219 Gastro-esophageal reflux disease without esophagitis: Secondary | ICD-10-CM | POA: Diagnosis not present

## 2019-08-18 DIAGNOSIS — Z9181 History of falling: Secondary | ICD-10-CM | POA: Diagnosis not present

## 2019-08-18 DIAGNOSIS — R208 Other disturbances of skin sensation: Secondary | ICD-10-CM | POA: Diagnosis not present

## 2019-08-18 DIAGNOSIS — G252 Other specified forms of tremor: Secondary | ICD-10-CM | POA: Diagnosis not present

## 2019-08-18 DIAGNOSIS — G894 Chronic pain syndrome: Secondary | ICD-10-CM | POA: Diagnosis not present

## 2019-08-18 DIAGNOSIS — J4520 Mild intermittent asthma, uncomplicated: Secondary | ICD-10-CM | POA: Diagnosis not present

## 2019-08-18 DIAGNOSIS — M1990 Unspecified osteoarthritis, unspecified site: Secondary | ICD-10-CM | POA: Diagnosis not present

## 2019-08-18 DIAGNOSIS — M5136 Other intervertebral disc degeneration, lumbar region: Secondary | ICD-10-CM | POA: Diagnosis not present

## 2019-08-18 DIAGNOSIS — M8580 Other specified disorders of bone density and structure, unspecified site: Secondary | ICD-10-CM | POA: Diagnosis not present

## 2019-08-18 DIAGNOSIS — Z87820 Personal history of traumatic brain injury: Secondary | ICD-10-CM | POA: Diagnosis not present

## 2019-08-18 DIAGNOSIS — J302 Other seasonal allergic rhinitis: Secondary | ICD-10-CM | POA: Diagnosis not present

## 2019-08-25 ENCOUNTER — Telehealth: Payer: Self-pay | Admitting: Orthopedic Surgery

## 2019-08-25 DIAGNOSIS — I10 Essential (primary) hypertension: Secondary | ICD-10-CM | POA: Diagnosis not present

## 2019-08-25 DIAGNOSIS — Z87820 Personal history of traumatic brain injury: Secondary | ICD-10-CM | POA: Diagnosis not present

## 2019-08-25 DIAGNOSIS — J302 Other seasonal allergic rhinitis: Secondary | ICD-10-CM | POA: Diagnosis not present

## 2019-08-25 DIAGNOSIS — G4700 Insomnia, unspecified: Secondary | ICD-10-CM | POA: Diagnosis not present

## 2019-08-25 DIAGNOSIS — Z7901 Long term (current) use of anticoagulants: Secondary | ICD-10-CM | POA: Diagnosis not present

## 2019-08-25 DIAGNOSIS — R569 Unspecified convulsions: Secondary | ICD-10-CM | POA: Diagnosis not present

## 2019-08-25 DIAGNOSIS — H9190 Unspecified hearing loss, unspecified ear: Secondary | ICD-10-CM | POA: Diagnosis not present

## 2019-08-25 DIAGNOSIS — Z471 Aftercare following joint replacement surgery: Secondary | ICD-10-CM | POA: Diagnosis not present

## 2019-08-25 DIAGNOSIS — G2581 Restless legs syndrome: Secondary | ICD-10-CM | POA: Diagnosis not present

## 2019-08-25 DIAGNOSIS — J4520 Mild intermittent asthma, uncomplicated: Secondary | ICD-10-CM | POA: Diagnosis not present

## 2019-08-25 DIAGNOSIS — M5136 Other intervertebral disc degeneration, lumbar region: Secondary | ICD-10-CM | POA: Diagnosis not present

## 2019-08-25 DIAGNOSIS — Z96641 Presence of right artificial hip joint: Secondary | ICD-10-CM | POA: Diagnosis not present

## 2019-08-25 DIAGNOSIS — G252 Other specified forms of tremor: Secondary | ICD-10-CM | POA: Diagnosis not present

## 2019-08-25 DIAGNOSIS — S72011D Unspecified intracapsular fracture of right femur, subsequent encounter for closed fracture with routine healing: Secondary | ICD-10-CM | POA: Diagnosis not present

## 2019-08-25 DIAGNOSIS — G894 Chronic pain syndrome: Secondary | ICD-10-CM | POA: Diagnosis not present

## 2019-08-25 DIAGNOSIS — M1990 Unspecified osteoarthritis, unspecified site: Secondary | ICD-10-CM | POA: Diagnosis not present

## 2019-08-25 DIAGNOSIS — M533 Sacrococcygeal disorders, not elsewhere classified: Secondary | ICD-10-CM | POA: Diagnosis not present

## 2019-08-25 DIAGNOSIS — Z9181 History of falling: Secondary | ICD-10-CM | POA: Diagnosis not present

## 2019-08-25 DIAGNOSIS — M8580 Other specified disorders of bone density and structure, unspecified site: Secondary | ICD-10-CM | POA: Diagnosis not present

## 2019-08-25 DIAGNOSIS — Z87891 Personal history of nicotine dependence: Secondary | ICD-10-CM | POA: Diagnosis not present

## 2019-08-25 DIAGNOSIS — E785 Hyperlipidemia, unspecified: Secondary | ICD-10-CM | POA: Diagnosis not present

## 2019-08-25 DIAGNOSIS — K219 Gastro-esophageal reflux disease without esophagitis: Secondary | ICD-10-CM | POA: Diagnosis not present

## 2019-08-25 DIAGNOSIS — R208 Other disturbances of skin sensation: Secondary | ICD-10-CM | POA: Diagnosis not present

## 2019-08-25 NOTE — Telephone Encounter (Signed)
[facility] at [facility] phone number [phone] called in for verbal orders. Requesting patient has OT for twice a week for 4 weeks and once a week for 2 weeks of OT. [facility] at [facility] phone number is [phone].

## 2019-08-26 ENCOUNTER — Other Ambulatory Visit: Payer: Self-pay | Admitting: *Deleted

## 2019-08-26 NOTE — Telephone Encounter (Signed)
IC verbal order given.  °

## 2019-08-29 ENCOUNTER — Telehealth: Payer: Self-pay | Admitting: Family Medicine

## 2019-08-29 NOTE — Telephone Encounter (Signed)
**  After Hours/ Emergency Line Call**  Received a call to report that Richard Glass requesting refill of hydrocodone.  Patient reports he has tried calling to get RX filled. Was told Dr. [name] filled meds but is not at pharmacy.  States that the medication he needs is his hydrocodone.  I have reviewed PDMP, last hydrocodone refill was on 07/30/2019 at which time he received 100 pills.  This would be a months worth of prescription.  I informed patient that I would forward this message to Dr. Arther Dames.  I explained to him that the after-hours emergency line is for emergencies and that narcotic refills should be done by his PCP.  Patient states he has been out all weekend of his medications.  Currently not in withdrawal.  Red flags discussed.  Will forward to PCP.  Caroline More, DO PGY-3, [facility] 08/29/2019 11:21 AM

## 2019-08-30 MED ORDER — HYDROCODONE-ACETAMINOPHEN 7.5-325 MG PO TABS: 1.0000 | ORAL_TABLET | Freq: Four times a day (QID) | ORAL | 0 refills | Status: DC | PRN

## 2019-08-30 NOTE — Telephone Encounter (Signed)
Patient calling again to check the status of prescription. Please advise.

## 2019-08-31 ENCOUNTER — Emergency Department (HOSPITAL_COMMUNITY)
Admission: EM | Admit: 2019-08-31 | Discharge: 2019-08-31 | Disposition: A | Discharge status: Home / Self Care | Payer: Medicare Other | Attending: Emergency Medicine | Admitting: Emergency Medicine

## 2019-08-31 ENCOUNTER — Other Ambulatory Visit: Payer: Self-pay

## 2019-08-31 ENCOUNTER — Encounter (HOSPITAL_COMMUNITY): Payer: Self-pay | Admitting: Emergency Medicine

## 2019-08-31 ENCOUNTER — Emergency Department (HOSPITAL_COMMUNITY): Payer: Medicare Other

## 2019-08-31 DIAGNOSIS — E876 Hypokalemia: Secondary | ICD-10-CM | POA: Diagnosis not present

## 2019-08-31 DIAGNOSIS — R200 Anesthesia of skin: Secondary | ICD-10-CM | POA: Diagnosis not present

## 2019-08-31 DIAGNOSIS — F17290 Nicotine dependence, other tobacco product, uncomplicated: Secondary | ICD-10-CM | POA: Diagnosis not present

## 2019-08-31 DIAGNOSIS — R202 Paresthesia of skin: Secondary | ICD-10-CM | POA: Diagnosis not present

## 2019-08-31 DIAGNOSIS — Z79899 Other long term (current) drug therapy: Secondary | ICD-10-CM | POA: Insufficient documentation

## 2019-08-31 LAB — BASIC METABOLIC PANEL
Anion gap: 10 (ref 5–15)
BUN: 6 mg/dL (ref 6–20)
CO2: 22 mmol/L (ref 22–32)
Calcium: 8.9 mg/dL (ref 8.9–10.3)
Chloride: 107 mmol/L (ref 98–111)
Creatinine, Ser: 0.86 mg/dL (ref 0.61–1.24)
GFR calc Af Amer: >60 mL/min (ref >60)
GFR calc non Af Amer: >60 mL/min (ref >60)
Glucose, Bld: 99 mg/dL (ref 70–99)
Potassium: 2.9 mmol/L — ABNORMAL LOW (ref 3.5–5.1)
Sodium: 139 mmol/L (ref 135–145)

## 2019-08-31 LAB — CBC WITH DIFFERENTIAL/PLATELET
Abs Immature Granulocytes: 0.02 10*3/uL (ref 0.00–0.07)
Basophils Absolute: 0 10*3/uL (ref 0.0–0.1)
Basophils Relative: 0 %
Eosinophils Absolute: 0.1 10*3/uL (ref 0.0–0.5)
Eosinophils Relative: 2 %
HCT: 43 % (ref 39.0–52.0)
Hemoglobin: 13.9 g/dL (ref 13.0–17.0)
Immature Granulocytes: 0 %
Lymphocytes Relative: 41 %
Lymphs Abs: 3.1 10*3/uL (ref 0.7–4.0)
MCH: 30 pg (ref 26.0–34.0)
MCHC: 32.3 g/dL (ref 30.0–36.0)
MCV: 92.9 fL (ref 80.0–100.0)
Monocytes Absolute: 0.5 10*3/uL (ref 0.1–1.0)
Monocytes Relative: 6 %
Neutro Abs: 3.9 10*3/uL (ref 1.7–7.7)
Neutrophils Relative %: 51 %
Platelets: 317 10*3/uL (ref 150–400)
RBC: 4.63 MIL/uL (ref 4.22–5.81)
RDW: 12.5 % (ref 11.5–15.5)
WBC: 7.6 10*3/uL (ref 4.0–10.5)
nRBC: 0 % (ref 0.0–0.2)

## 2019-08-31 MED ORDER — POTASSIUM CHLORIDE CRYS ER 20 MEQ PO TBCR
40.0000 meq | EXTENDED_RELEASE_TABLET | Freq: Once | ORAL | Status: AC
  Administered 2019-08-31: 40 meq via ORAL
  Filled 2019-08-31: qty 2

## 2019-08-31 MED ORDER — POTASSIUM CHLORIDE CRYS ER 20 MEQ PO TBCR: 20.0000 meq | EXTENDED_RELEASE_TABLET | Freq: Two times a day (BID) | ORAL | 0 refills | Status: DC

## 2019-08-31 NOTE — Discharge Instructions (Addendum)
Your potassium is low.  This could be contributing to your tingling.  Prescription for potassium medicine.  Follow-up with your primary care doctor.
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
## 2019-08-31 NOTE — ED Notes (Signed)
Patient verbalizes understanding of discharge instructions. Opportunity for questioning and answers were provided. Armband removed by staff, pt discharged from ED.  

## 2019-08-31 NOTE — ED Triage Notes (Signed)
Patient reports bilateral thigh pain radiating to lower legs for 2 weeks , denies injury , ambulatory using his cane . Denies fever / respirations unlabored.

## 2019-09-01 DIAGNOSIS — G4700 Insomnia, unspecified: Secondary | ICD-10-CM | POA: Diagnosis not present

## 2019-09-01 DIAGNOSIS — Z87820 Personal history of traumatic brain injury: Secondary | ICD-10-CM | POA: Diagnosis not present

## 2019-09-01 DIAGNOSIS — R208 Other disturbances of skin sensation: Secondary | ICD-10-CM | POA: Diagnosis not present

## 2019-09-01 DIAGNOSIS — Z9181 History of falling: Secondary | ICD-10-CM | POA: Diagnosis not present

## 2019-09-01 DIAGNOSIS — K219 Gastro-esophageal reflux disease without esophagitis: Secondary | ICD-10-CM | POA: Diagnosis not present

## 2019-09-01 DIAGNOSIS — H9190 Unspecified hearing loss, unspecified ear: Secondary | ICD-10-CM | POA: Diagnosis not present

## 2019-09-01 DIAGNOSIS — G2581 Restless legs syndrome: Secondary | ICD-10-CM | POA: Diagnosis not present

## 2019-09-01 DIAGNOSIS — G252 Other specified forms of tremor: Secondary | ICD-10-CM | POA: Diagnosis not present

## 2019-09-01 DIAGNOSIS — Z7901 Long term (current) use of anticoagulants: Secondary | ICD-10-CM | POA: Diagnosis not present

## 2019-09-01 DIAGNOSIS — M1990 Unspecified osteoarthritis, unspecified site: Secondary | ICD-10-CM | POA: Diagnosis not present

## 2019-09-01 DIAGNOSIS — Z96641 Presence of right artificial hip joint: Secondary | ICD-10-CM | POA: Diagnosis not present

## 2019-09-01 DIAGNOSIS — J302 Other seasonal allergic rhinitis: Secondary | ICD-10-CM | POA: Diagnosis not present

## 2019-09-01 DIAGNOSIS — R569 Unspecified convulsions: Secondary | ICD-10-CM | POA: Diagnosis not present

## 2019-09-01 DIAGNOSIS — Z87891 Personal history of nicotine dependence: Secondary | ICD-10-CM | POA: Diagnosis not present

## 2019-09-01 DIAGNOSIS — I10 Essential (primary) hypertension: Secondary | ICD-10-CM | POA: Diagnosis not present

## 2019-09-01 DIAGNOSIS — M8580 Other specified disorders of bone density and structure, unspecified site: Secondary | ICD-10-CM | POA: Diagnosis not present

## 2019-09-01 DIAGNOSIS — G894 Chronic pain syndrome: Secondary | ICD-10-CM | POA: Diagnosis not present

## 2019-09-01 DIAGNOSIS — E785 Hyperlipidemia, unspecified: Secondary | ICD-10-CM | POA: Diagnosis not present

## 2019-09-01 DIAGNOSIS — J4520 Mild intermittent asthma, uncomplicated: Secondary | ICD-10-CM | POA: Diagnosis not present

## 2019-09-01 DIAGNOSIS — S72011D Unspecified intracapsular fracture of right femur, subsequent encounter for closed fracture with routine healing: Secondary | ICD-10-CM | POA: Diagnosis not present

## 2019-09-01 DIAGNOSIS — M533 Sacrococcygeal disorders, not elsewhere classified: Secondary | ICD-10-CM | POA: Diagnosis not present

## 2019-09-01 DIAGNOSIS — M5136 Other intervertebral disc degeneration, lumbar region: Secondary | ICD-10-CM | POA: Diagnosis not present

## 2019-09-02 ENCOUNTER — Telehealth: Payer: Self-pay | Admitting: Family Medicine

## 2019-09-02 NOTE — Telephone Encounter (Signed)
Pt called and would like to speak to Dr. [name] about his leg since have surgery he is a lot of pain  And he keeps falling. jw

## 2019-09-03 ENCOUNTER — Encounter: Payer: Self-pay | Admitting: Family Medicine

## 2019-09-03 ENCOUNTER — Other Ambulatory Visit: Payer: Self-pay

## 2019-09-03 ENCOUNTER — Ambulatory Visit (INDEPENDENT_AMBULATORY_CARE_PROVIDER_SITE_OTHER): Payer: Medicare Other | Admitting: Family Medicine

## 2019-09-03 VITALS — BP 110/62 | HR 60 | Wt 140.0 lb

## 2019-09-03 DIAGNOSIS — M533 Sacrococcygeal disorders, not elsewhere classified: Secondary | ICD-10-CM | POA: Diagnosis not present

## 2019-09-03 DIAGNOSIS — M4807 Spinal stenosis, lumbosacral region: Secondary | ICD-10-CM | POA: Diagnosis not present

## 2019-09-03 DIAGNOSIS — J4520 Mild intermittent asthma, uncomplicated: Secondary | ICD-10-CM | POA: Diagnosis not present

## 2019-09-03 DIAGNOSIS — R0989 Other specified symptoms and signs involving the circulatory and respiratory systems: Secondary | ICD-10-CM

## 2019-09-03 DIAGNOSIS — Z87891 Personal history of nicotine dependence: Secondary | ICD-10-CM | POA: Diagnosis not present

## 2019-09-03 DIAGNOSIS — G2581 Restless legs syndrome: Secondary | ICD-10-CM | POA: Diagnosis not present

## 2019-09-03 DIAGNOSIS — R208 Other disturbances of skin sensation: Secondary | ICD-10-CM | POA: Diagnosis not present

## 2019-09-03 DIAGNOSIS — I10 Essential (primary) hypertension: Secondary | ICD-10-CM | POA: Diagnosis not present

## 2019-09-03 DIAGNOSIS — Z87820 Personal history of traumatic brain injury: Secondary | ICD-10-CM | POA: Diagnosis not present

## 2019-09-03 DIAGNOSIS — M8580 Other specified disorders of bone density and structure, unspecified site: Secondary | ICD-10-CM | POA: Diagnosis not present

## 2019-09-03 DIAGNOSIS — R569 Unspecified convulsions: Secondary | ICD-10-CM | POA: Diagnosis not present

## 2019-09-03 DIAGNOSIS — G252 Other specified forms of tremor: Secondary | ICD-10-CM | POA: Diagnosis not present

## 2019-09-03 DIAGNOSIS — G4700 Insomnia, unspecified: Secondary | ICD-10-CM | POA: Diagnosis not present

## 2019-09-03 DIAGNOSIS — H9190 Unspecified hearing loss, unspecified ear: Secondary | ICD-10-CM | POA: Diagnosis not present

## 2019-09-03 DIAGNOSIS — E785 Hyperlipidemia, unspecified: Secondary | ICD-10-CM | POA: Diagnosis not present

## 2019-09-03 DIAGNOSIS — Z9181 History of falling: Secondary | ICD-10-CM | POA: Diagnosis not present

## 2019-09-03 DIAGNOSIS — J302 Other seasonal allergic rhinitis: Secondary | ICD-10-CM | POA: Diagnosis not present

## 2019-09-03 DIAGNOSIS — K219 Gastro-esophageal reflux disease without esophagitis: Secondary | ICD-10-CM | POA: Diagnosis not present

## 2019-09-03 DIAGNOSIS — Z7901 Long term (current) use of anticoagulants: Secondary | ICD-10-CM | POA: Diagnosis not present

## 2019-09-03 DIAGNOSIS — S72011D Unspecified intracapsular fracture of right femur, subsequent encounter for closed fracture with routine healing: Secondary | ICD-10-CM | POA: Diagnosis not present

## 2019-09-03 DIAGNOSIS — G894 Chronic pain syndrome: Secondary | ICD-10-CM | POA: Diagnosis not present

## 2019-09-03 DIAGNOSIS — M1990 Unspecified osteoarthritis, unspecified site: Secondary | ICD-10-CM | POA: Diagnosis not present

## 2019-09-03 DIAGNOSIS — Z96641 Presence of right artificial hip joint: Secondary | ICD-10-CM | POA: Diagnosis not present

## 2019-09-03 DIAGNOSIS — R29898 Other symptoms and signs involving the musculoskeletal system: Secondary | ICD-10-CM | POA: Diagnosis not present

## 2019-09-03 DIAGNOSIS — M5136 Other intervertebral disc degeneration, lumbar region: Secondary | ICD-10-CM | POA: Diagnosis not present

## 2019-09-03 NOTE — Patient Instructions (Addendum)
It was so wonderful seeing you today.  I have some concerns for spinal stenosis and we will need to get an MRI of your back to further assess this especially given your leg weakness and change in sensation.  You will receive a call to get scheduled for this and also get the ABIs for you.  If you develop further weakness to the point that you are not able to walk, urinate or have a bowel movement on yourself without your knowledge, or have severe decreased sensation to where you cannot feel your groin suddenly--> you need to go to the ED immediately.  You can also increase your baclofen to 1 tablet 3 times a day to help with your muscle spasms.

## 2019-09-03 NOTE — Telephone Encounter (Signed)
Mr Atondo has a long history of low back pain with  History from patient may be less than ideal given history of traumatic brain injury as child.   - Recent right hip fracture after fall from ladder aroung 06/25/19. He is s/p hip cannulated pinning 06/27/19 by Dr Marcene Duos. - Last post-op check (see office visit note) 08/04/19 with Dr Marlou Sa, Mr Veech was having pain with standing and hip rotation. Right Hip Xray showed several mm of fracture settling in subcapital femoral neck fracture.  Hardware alignment was good, but there was 2 mm of backing out compared with 4 wks previous.   Dr Marlou Sa planned to see how healing was doing over the following 6 weeks. If does not heal, then he may need a total hip replacement  -    - Patient called 08/29/19 to the [facility] after-hours line requesting refill of hydrocodone/APAP as he had run out early of these this medication.  His usual monthly refill was sent in by Dr Emile Kyllo on 08/30/19.  He has received narcotic prescriptions from his usual prescribers.    - ED visit 08/31/19 complaining of bilateral thigh tingling x 2 weeks. No bowel/bladder incontinence.    A recent Lumbar Spine CT in ED after a fall from a ladder (06/24/19) showed no acute abnormalities after fall from ladder.   A lumbar spine XR (4V) 08/31/19 was normal.  Patient diagnosed with serum potassium 2.9 and uncertain cause of his bilateral thigh numbness. He was given 40 mEq po of KCl and DC'd.  ------------------------------------------------------------------------------------------------------------------------------------------------------------------------------------------------------------------------------  I spoke with Mr Bohlen by phone today.  He continues to complain of pain in his legs for last few weeks.  It is bilateral pain, that is both shooting pain "down the both leg" and a burning pain in front and back of thighs. It is worse at night when he lays down.  He cannot find a comfortable  position, so has had poor sleep the last few weeks.   He feels that both his legs are weaker. He is able to walk, but is now having to use a walker.  He denies changes in the ability to urinate.   He attempted to reached Dr Marcene Duos (Ortho) office, but gets only the answering machine.   A/ C/O Bilateral proximal leg paresthesia and Bilateral radicular-like pain - If the history is correct and the exam does not support more peripheral musculoskeletal causes, then a central process would be a 'do not miss" diagnosis.  - Re-consultation with Dr Marlou Sa (ortho) may be appropriate if the primary pain origin is in the right proximal leg.

## 2019-09-03 NOTE — Telephone Encounter (Signed)
Patient has an access to care appt today and will address at that time.  Will send to PCP to make him aware of patient's concerns.  Anwen Cannedy,CMA

## 2019-09-03 NOTE — Progress Notes (Signed)
Subjective:    Patient ID: Richard Glass, male    DOB: September 30, 1970, 48 y.o.   MRN: [id number]   CC: Follow-up leg surgery  HPI: Richard Glass is a 48 year old gentleman with GERD, history of traumatic brain injury, chronic pain syndrome, and asthma presenting discussed the following:  Leg pain  recent hip surgery: Sustained a right subcapital femoral neck fracture s/p right hip pinning on 06/27/2019.  Performed by Dr. Marlou Sa, orthopedic surgeon.  Recently followed up with his orthopedic surgeon on 11/18, still noted pain with standing and diminished pedal pulses.  X-ray of his hip at that time showing good position alignment of hardware, with about 2 mm of backing out compared to x-rays 4 weeks prior.  Recommended monitoring completion of healing over the next 6 weeks and to follow-up with him at the end of December, stated he may need a total hip replacement if it is not healing well.  He has an appointment scheduled with him for 09/22/2019.  Currently on Norco 7.5-325 every 6 hours as needed, baclofen 5 mg TID,  and meloxicam 15 mg.  Today, he continues to struggle with pain in his bilateral legs for the past several weeks, feels it going all the way down to his lower legs.  Describes as shooting down his leg with a burning pain in the front and back of his thighs, however burning/tingling more predominant in the front.  He also feels that his legs are weaker, however can walk with the use of a walker now. If extends backwards, will get pain down both legs. Cramping in the thighs and having a lot of thigh spasms.  Denies any bowel or bladder incontinence/retention, saddle anesthesia, back pain, fever.  Had a lumbar x-ray performed on 12/15 which was unremarkable and CT lumbar without acute abnormality.    Smoking status reviewed  Review of Systems Per HPI    Objective:  BP 110/62   Pulse 60   Wt 140 lb (63.5 kg)   SpO2 98%   BMI 20.67 kg/m  Vitals and nursing note reviewed  General:  Pleasant, appears uncomfortable Respiratory: Unlabored breathing Abdomen: soft, nontender, nondistended Extremities: no edema or cyanosis. WWP.  Diminished dorsalis pedis pulses bilaterally. Skin: warm and dry, no rashes noted Neuro/MSK: alert and oriented, nontender to palpation of his bilateral lower extremity.  Decreased IR/ER with left hip.  Diminished sensation to light touch throughout bilateral lower extremity to level of his hip, most predominant on right anterior thigh that is been present since his surgery in 06/2019, normal in BUE.  2/4 patellar reflexes bilaterally, unable to get great Achilles reflexes bilaterally that may have been due to his positioning.  Experienced numerous muscle spasms mainly in his left posterior thigh during examination.  4/5 muscle extremity strength throughout bilateral lower extremity, highly spasmodic with bursts of strength rather than consistent follow-through.  Can walk, but needs to bend forward with most of his weight on his walker for support, otherwise would be off balanced.  Psych: normal affect  ABIs performed in the clinic today, left and right both 1.3, normal but suggesting likely atherosclerotic calcifications within his vessels.  Assessment &plan   Bilateral leg weakness Associated with decreased sensation to light touch bilaterally to the level of his superior thighs and pseudoclaudication.  Clinical symptomatology is suspicious for a spinal stenosis/ ?Lumbar myelopathy, likely due to a bulging disc as reassuringly CT lumbar in 06/2019 was not showing any space occupying lesions or anatomical abnormalities.  Doubtfully suspect  his recent right hip surgery is responsible for his current presentation, however I am sure is likely contributing to his overall pain. -MRI lumbar spine without contrast, hopeful to have this performed next week -ABIs normal at 1.3 bilaterally, however likely suggestive of vascular calcifications, PCP aware and will  follow up -Increase baclofen to 10 mg TID to help with spasms -Continue Norco as prescribed and meloxicam as needed, may also try topical therapies for relief -ED precautions discussed including inability to walk, bowel/bladder dysfunction, saddle anesthesia, intolerable pain despite pain regimen   Follow-up after MRI or sooner if needed with PCP.   [facility] Resident PGY-2

## 2019-09-03 NOTE — Assessment & Plan Note (Addendum)
Associated with decreased sensation to light touch bilaterally to the level of his superior thighs and pseudoclaudication.  Clinical symptomatology is suspicious for a spinal stenosis/ ?Lumbar myelopathy, likely due to a bulging disc as reassuringly CT lumbar in 06/2019 was not showing any space occupying lesions or anatomical abnormalities.  Doubtfully suspect his recent right hip surgery is responsible for his current presentation, however I am sure is likely contributing to his overall pain. -MRI lumbar spine without contrast, hopeful to have this performed next week -ABIs normal at 1.3 bilaterally, however likely suggestive of vascular calcifications, PCP aware and will follow up -Increase baclofen to 10 mg TID to help with spasms -Continue Norco as prescribed and meloxicam as needed, may also try topical therapies for relief -ED precautions discussed including inability to walk, bowel/bladder dysfunction, saddle anesthesia, intolerable pain despite pain regimen

## 2019-09-05 DIAGNOSIS — S72011D Unspecified intracapsular fracture of right femur, subsequent encounter for closed fracture with routine healing: Secondary | ICD-10-CM | POA: Diagnosis not present

## 2019-09-05 DIAGNOSIS — M8580 Other specified disorders of bone density and structure, unspecified site: Secondary | ICD-10-CM | POA: Diagnosis not present

## 2019-09-05 DIAGNOSIS — G4700 Insomnia, unspecified: Secondary | ICD-10-CM | POA: Diagnosis not present

## 2019-09-05 DIAGNOSIS — M1990 Unspecified osteoarthritis, unspecified site: Secondary | ICD-10-CM | POA: Diagnosis not present

## 2019-09-05 DIAGNOSIS — J302 Other seasonal allergic rhinitis: Secondary | ICD-10-CM | POA: Diagnosis not present

## 2019-09-05 DIAGNOSIS — M533 Sacrococcygeal disorders, not elsewhere classified: Secondary | ICD-10-CM | POA: Diagnosis not present

## 2019-09-05 DIAGNOSIS — Z87820 Personal history of traumatic brain injury: Secondary | ICD-10-CM | POA: Diagnosis not present

## 2019-09-05 DIAGNOSIS — Z9181 History of falling: Secondary | ICD-10-CM | POA: Diagnosis not present

## 2019-09-05 DIAGNOSIS — Z87891 Personal history of nicotine dependence: Secondary | ICD-10-CM | POA: Diagnosis not present

## 2019-09-05 DIAGNOSIS — I10 Essential (primary) hypertension: Secondary | ICD-10-CM | POA: Diagnosis not present

## 2019-09-05 DIAGNOSIS — G252 Other specified forms of tremor: Secondary | ICD-10-CM | POA: Diagnosis not present

## 2019-09-05 DIAGNOSIS — J4520 Mild intermittent asthma, uncomplicated: Secondary | ICD-10-CM | POA: Diagnosis not present

## 2019-09-05 DIAGNOSIS — R208 Other disturbances of skin sensation: Secondary | ICD-10-CM | POA: Diagnosis not present

## 2019-09-05 DIAGNOSIS — M5136 Other intervertebral disc degeneration, lumbar region: Secondary | ICD-10-CM | POA: Diagnosis not present

## 2019-09-05 DIAGNOSIS — R569 Unspecified convulsions: Secondary | ICD-10-CM | POA: Diagnosis not present

## 2019-09-05 DIAGNOSIS — K219 Gastro-esophageal reflux disease without esophagitis: Secondary | ICD-10-CM | POA: Diagnosis not present

## 2019-09-05 DIAGNOSIS — G2581 Restless legs syndrome: Secondary | ICD-10-CM | POA: Diagnosis not present

## 2019-09-05 DIAGNOSIS — Z96641 Presence of right artificial hip joint: Secondary | ICD-10-CM | POA: Diagnosis not present

## 2019-09-05 DIAGNOSIS — H9190 Unspecified hearing loss, unspecified ear: Secondary | ICD-10-CM | POA: Diagnosis not present

## 2019-09-05 DIAGNOSIS — G894 Chronic pain syndrome: Secondary | ICD-10-CM | POA: Diagnosis not present

## 2019-09-05 DIAGNOSIS — E785 Hyperlipidemia, unspecified: Secondary | ICD-10-CM | POA: Diagnosis not present

## 2019-09-05 DIAGNOSIS — Z7901 Long term (current) use of anticoagulants: Secondary | ICD-10-CM | POA: Diagnosis not present

## 2019-09-07 DIAGNOSIS — G4700 Insomnia, unspecified: Secondary | ICD-10-CM | POA: Diagnosis not present

## 2019-09-07 DIAGNOSIS — M8580 Other specified disorders of bone density and structure, unspecified site: Secondary | ICD-10-CM | POA: Diagnosis not present

## 2019-09-07 DIAGNOSIS — R569 Unspecified convulsions: Secondary | ICD-10-CM | POA: Diagnosis not present

## 2019-09-07 DIAGNOSIS — H9190 Unspecified hearing loss, unspecified ear: Secondary | ICD-10-CM | POA: Diagnosis not present

## 2019-09-07 DIAGNOSIS — Z87820 Personal history of traumatic brain injury: Secondary | ICD-10-CM | POA: Diagnosis not present

## 2019-09-07 DIAGNOSIS — R208 Other disturbances of skin sensation: Secondary | ICD-10-CM | POA: Diagnosis not present

## 2019-09-07 DIAGNOSIS — K219 Gastro-esophageal reflux disease without esophagitis: Secondary | ICD-10-CM | POA: Diagnosis not present

## 2019-09-07 DIAGNOSIS — I10 Essential (primary) hypertension: Secondary | ICD-10-CM | POA: Diagnosis not present

## 2019-09-07 DIAGNOSIS — M1990 Unspecified osteoarthritis, unspecified site: Secondary | ICD-10-CM | POA: Diagnosis not present

## 2019-09-07 DIAGNOSIS — Z7901 Long term (current) use of anticoagulants: Secondary | ICD-10-CM | POA: Diagnosis not present

## 2019-09-07 DIAGNOSIS — J302 Other seasonal allergic rhinitis: Secondary | ICD-10-CM | POA: Diagnosis not present

## 2019-09-07 DIAGNOSIS — G894 Chronic pain syndrome: Secondary | ICD-10-CM | POA: Diagnosis not present

## 2019-09-07 DIAGNOSIS — J4520 Mild intermittent asthma, uncomplicated: Secondary | ICD-10-CM | POA: Diagnosis not present

## 2019-09-07 DIAGNOSIS — Z9181 History of falling: Secondary | ICD-10-CM | POA: Diagnosis not present

## 2019-09-07 DIAGNOSIS — G252 Other specified forms of tremor: Secondary | ICD-10-CM | POA: Diagnosis not present

## 2019-09-07 DIAGNOSIS — Z96641 Presence of right artificial hip joint: Secondary | ICD-10-CM | POA: Diagnosis not present

## 2019-09-07 DIAGNOSIS — S72011D Unspecified intracapsular fracture of right femur, subsequent encounter for closed fracture with routine healing: Secondary | ICD-10-CM | POA: Diagnosis not present

## 2019-09-07 DIAGNOSIS — G2581 Restless legs syndrome: Secondary | ICD-10-CM | POA: Diagnosis not present

## 2019-09-07 DIAGNOSIS — Z87891 Personal history of nicotine dependence: Secondary | ICD-10-CM | POA: Diagnosis not present

## 2019-09-07 DIAGNOSIS — M5136 Other intervertebral disc degeneration, lumbar region: Secondary | ICD-10-CM | POA: Diagnosis not present

## 2019-09-07 DIAGNOSIS — M533 Sacrococcygeal disorders, not elsewhere classified: Secondary | ICD-10-CM | POA: Diagnosis not present

## 2019-09-07 DIAGNOSIS — E785 Hyperlipidemia, unspecified: Secondary | ICD-10-CM | POA: Diagnosis not present

## 2019-09-08 ENCOUNTER — Telehealth: Payer: Self-pay | Admitting: Orthopedic Surgery

## 2019-09-08 MED ORDER — TIZANIDINE HCL 2 MG PO TABS: 2.0000 mg | ORAL_TABLET | Freq: Two times a day (BID) | ORAL | 0 refills | Status: DC | PRN

## 2019-09-08 NOTE — Telephone Encounter (Signed)
Pls call in mr robax 500 q 12 # 30 next mon thx

## 2019-09-08 NOTE — Telephone Encounter (Signed)
Please advise. Thanks.  

## 2019-09-08 NOTE — Telephone Encounter (Signed)
Patient called stating that he wakes up every morning with his arms shaking and unable to pour coffee without dropping the coffee pot.  He also stated that he legs are not any better.  CB#[phone].  Thank you.

## 2019-09-14 DIAGNOSIS — H9190 Unspecified hearing loss, unspecified ear: Secondary | ICD-10-CM | POA: Diagnosis not present

## 2019-09-14 DIAGNOSIS — J302 Other seasonal allergic rhinitis: Secondary | ICD-10-CM | POA: Diagnosis not present

## 2019-09-14 DIAGNOSIS — Z9181 History of falling: Secondary | ICD-10-CM | POA: Diagnosis not present

## 2019-09-14 DIAGNOSIS — M5136 Other intervertebral disc degeneration, lumbar region: Secondary | ICD-10-CM | POA: Diagnosis not present

## 2019-09-14 DIAGNOSIS — M8580 Other specified disorders of bone density and structure, unspecified site: Secondary | ICD-10-CM | POA: Diagnosis not present

## 2019-09-14 DIAGNOSIS — M545 Low back pain: Secondary | ICD-10-CM | POA: Diagnosis not present

## 2019-09-14 DIAGNOSIS — Z87891 Personal history of nicotine dependence: Secondary | ICD-10-CM | POA: Diagnosis not present

## 2019-09-14 DIAGNOSIS — E785 Hyperlipidemia, unspecified: Secondary | ICD-10-CM | POA: Diagnosis not present

## 2019-09-14 DIAGNOSIS — G894 Chronic pain syndrome: Secondary | ICD-10-CM | POA: Diagnosis not present

## 2019-09-14 DIAGNOSIS — M533 Sacrococcygeal disorders, not elsewhere classified: Secondary | ICD-10-CM | POA: Diagnosis not present

## 2019-09-14 DIAGNOSIS — G2581 Restless legs syndrome: Secondary | ICD-10-CM | POA: Diagnosis not present

## 2019-09-14 DIAGNOSIS — J4520 Mild intermittent asthma, uncomplicated: Secondary | ICD-10-CM | POA: Diagnosis not present

## 2019-09-14 DIAGNOSIS — R208 Other disturbances of skin sensation: Secondary | ICD-10-CM | POA: Diagnosis not present

## 2019-09-14 DIAGNOSIS — Z87820 Personal history of traumatic brain injury: Secondary | ICD-10-CM | POA: Diagnosis not present

## 2019-09-14 DIAGNOSIS — G252 Other specified forms of tremor: Secondary | ICD-10-CM | POA: Diagnosis not present

## 2019-09-14 DIAGNOSIS — I10 Essential (primary) hypertension: Secondary | ICD-10-CM | POA: Diagnosis not present

## 2019-09-14 DIAGNOSIS — Z96641 Presence of right artificial hip joint: Secondary | ICD-10-CM | POA: Diagnosis not present

## 2019-09-14 DIAGNOSIS — M1990 Unspecified osteoarthritis, unspecified site: Secondary | ICD-10-CM | POA: Diagnosis not present

## 2019-09-14 DIAGNOSIS — S72011D Unspecified intracapsular fracture of right femur, subsequent encounter for closed fracture with routine healing: Secondary | ICD-10-CM | POA: Diagnosis not present

## 2019-09-14 DIAGNOSIS — K219 Gastro-esophageal reflux disease without esophagitis: Secondary | ICD-10-CM | POA: Diagnosis not present

## 2019-09-14 DIAGNOSIS — R569 Unspecified convulsions: Secondary | ICD-10-CM | POA: Diagnosis not present

## 2019-09-14 DIAGNOSIS — Z7901 Long term (current) use of anticoagulants: Secondary | ICD-10-CM | POA: Diagnosis not present

## 2019-09-14 DIAGNOSIS — M542 Cervicalgia: Secondary | ICD-10-CM | POA: Diagnosis not present

## 2019-09-14 DIAGNOSIS — G4700 Insomnia, unspecified: Secondary | ICD-10-CM | POA: Diagnosis not present

## 2019-09-16 DIAGNOSIS — M1990 Unspecified osteoarthritis, unspecified site: Secondary | ICD-10-CM | POA: Diagnosis not present

## 2019-09-16 DIAGNOSIS — G2581 Restless legs syndrome: Secondary | ICD-10-CM | POA: Diagnosis not present

## 2019-09-16 DIAGNOSIS — G252 Other specified forms of tremor: Secondary | ICD-10-CM | POA: Diagnosis not present

## 2019-09-16 DIAGNOSIS — Z87820 Personal history of traumatic brain injury: Secondary | ICD-10-CM | POA: Diagnosis not present

## 2019-09-16 DIAGNOSIS — Z9181 History of falling: Secondary | ICD-10-CM | POA: Diagnosis not present

## 2019-09-16 DIAGNOSIS — S72011D Unspecified intracapsular fracture of right femur, subsequent encounter for closed fracture with routine healing: Secondary | ICD-10-CM | POA: Diagnosis not present

## 2019-09-16 DIAGNOSIS — M5136 Other intervertebral disc degeneration, lumbar region: Secondary | ICD-10-CM | POA: Diagnosis not present

## 2019-09-16 DIAGNOSIS — H9190 Unspecified hearing loss, unspecified ear: Secondary | ICD-10-CM | POA: Diagnosis not present

## 2019-09-16 DIAGNOSIS — K219 Gastro-esophageal reflux disease without esophagitis: Secondary | ICD-10-CM | POA: Diagnosis not present

## 2019-09-16 DIAGNOSIS — G4700 Insomnia, unspecified: Secondary | ICD-10-CM | POA: Diagnosis not present

## 2019-09-16 DIAGNOSIS — Z96641 Presence of right artificial hip joint: Secondary | ICD-10-CM | POA: Diagnosis not present

## 2019-09-16 DIAGNOSIS — M533 Sacrococcygeal disorders, not elsewhere classified: Secondary | ICD-10-CM | POA: Diagnosis not present

## 2019-09-16 DIAGNOSIS — M8580 Other specified disorders of bone density and structure, unspecified site: Secondary | ICD-10-CM | POA: Diagnosis not present

## 2019-09-16 DIAGNOSIS — Z87891 Personal history of nicotine dependence: Secondary | ICD-10-CM | POA: Diagnosis not present

## 2019-09-16 DIAGNOSIS — Z7901 Long term (current) use of anticoagulants: Secondary | ICD-10-CM | POA: Diagnosis not present

## 2019-09-16 DIAGNOSIS — G894 Chronic pain syndrome: Secondary | ICD-10-CM | POA: Diagnosis not present

## 2019-09-16 DIAGNOSIS — R208 Other disturbances of skin sensation: Secondary | ICD-10-CM | POA: Diagnosis not present

## 2019-09-16 DIAGNOSIS — R569 Unspecified convulsions: Secondary | ICD-10-CM | POA: Diagnosis not present

## 2019-09-16 DIAGNOSIS — E785 Hyperlipidemia, unspecified: Secondary | ICD-10-CM | POA: Diagnosis not present

## 2019-09-16 DIAGNOSIS — J4520 Mild intermittent asthma, uncomplicated: Secondary | ICD-10-CM | POA: Diagnosis not present

## 2019-09-16 DIAGNOSIS — J302 Other seasonal allergic rhinitis: Secondary | ICD-10-CM | POA: Diagnosis not present

## 2019-09-16 DIAGNOSIS — I10 Essential (primary) hypertension: Secondary | ICD-10-CM | POA: Diagnosis not present

## 2019-09-18 DIAGNOSIS — M5412 Radiculopathy, cervical region: Secondary | ICD-10-CM | POA: Diagnosis not present

## 2019-09-20 ENCOUNTER — Telehealth: Payer: Self-pay | Admitting: Neurology

## 2019-09-20 ENCOUNTER — Encounter: Payer: Self-pay | Admitting: Family Medicine

## 2019-09-20 NOTE — Telephone Encounter (Signed)
I don;t understand this message can you explain further why this was sent to me? thanks

## 2019-09-20 NOTE — Progress Notes (Signed)
OV note summary Richard Glass, date 09/14/19  Dxs 1. Abnormal weight loss - 10/01/18 165 lbs ==> 09/05/19 140 lbs. Referral to PCP 2. DDD lumbar 3. LBP - Neurology referral 4. Intention Tremor 5. Cervical radiculopathy - ordering cervical spine MRI [facility] 09/18/19. Dr Rolena Infante will see patient back after cervical mri 6. Neck Pain

## 2019-09-21 DIAGNOSIS — J4520 Mild intermittent asthma, uncomplicated: Secondary | ICD-10-CM | POA: Diagnosis not present

## 2019-09-21 DIAGNOSIS — Z87820 Personal history of traumatic brain injury: Secondary | ICD-10-CM | POA: Diagnosis not present

## 2019-09-21 DIAGNOSIS — G2581 Restless legs syndrome: Secondary | ICD-10-CM | POA: Diagnosis not present

## 2019-09-21 DIAGNOSIS — G894 Chronic pain syndrome: Secondary | ICD-10-CM | POA: Diagnosis not present

## 2019-09-21 DIAGNOSIS — M8580 Other specified disorders of bone density and structure, unspecified site: Secondary | ICD-10-CM | POA: Diagnosis not present

## 2019-09-21 DIAGNOSIS — M533 Sacrococcygeal disorders, not elsewhere classified: Secondary | ICD-10-CM | POA: Diagnosis not present

## 2019-09-21 DIAGNOSIS — M5136 Other intervertebral disc degeneration, lumbar region: Secondary | ICD-10-CM | POA: Diagnosis not present

## 2019-09-21 DIAGNOSIS — E785 Hyperlipidemia, unspecified: Secondary | ICD-10-CM | POA: Diagnosis not present

## 2019-09-21 DIAGNOSIS — J302 Other seasonal allergic rhinitis: Secondary | ICD-10-CM | POA: Diagnosis not present

## 2019-09-21 DIAGNOSIS — G252 Other specified forms of tremor: Secondary | ICD-10-CM | POA: Diagnosis not present

## 2019-09-21 DIAGNOSIS — H9190 Unspecified hearing loss, unspecified ear: Secondary | ICD-10-CM | POA: Diagnosis not present

## 2019-09-21 DIAGNOSIS — S72011D Unspecified intracapsular fracture of right femur, subsequent encounter for closed fracture with routine healing: Secondary | ICD-10-CM | POA: Diagnosis not present

## 2019-09-21 DIAGNOSIS — Z87891 Personal history of nicotine dependence: Secondary | ICD-10-CM | POA: Diagnosis not present

## 2019-09-21 DIAGNOSIS — Z9181 History of falling: Secondary | ICD-10-CM | POA: Diagnosis not present

## 2019-09-21 DIAGNOSIS — K219 Gastro-esophageal reflux disease without esophagitis: Secondary | ICD-10-CM | POA: Diagnosis not present

## 2019-09-21 DIAGNOSIS — Z96641 Presence of right artificial hip joint: Secondary | ICD-10-CM | POA: Diagnosis not present

## 2019-09-21 DIAGNOSIS — G4700 Insomnia, unspecified: Secondary | ICD-10-CM | POA: Diagnosis not present

## 2019-09-21 DIAGNOSIS — Z7901 Long term (current) use of anticoagulants: Secondary | ICD-10-CM | POA: Diagnosis not present

## 2019-09-21 DIAGNOSIS — M1990 Unspecified osteoarthritis, unspecified site: Secondary | ICD-10-CM | POA: Diagnosis not present

## 2019-09-21 DIAGNOSIS — R208 Other disturbances of skin sensation: Secondary | ICD-10-CM | POA: Diagnosis not present

## 2019-09-21 DIAGNOSIS — R569 Unspecified convulsions: Secondary | ICD-10-CM | POA: Diagnosis not present

## 2019-09-21 DIAGNOSIS — I10 Essential (primary) hypertension: Secondary | ICD-10-CM | POA: Diagnosis not present

## 2019-09-21 NOTE — Telephone Encounter (Signed)
Okay to try to get EMG as soon as possible.

## 2019-09-21 NOTE — Telephone Encounter (Signed)
Dr Jaynee Eagles please disregard this is a pt of Dr Jannifer Franklin.

## 2019-09-21 NOTE — Telephone Encounter (Signed)
Pt has a order for a urgent EMG/NCV please advise for scheduling.  Thank you

## 2019-09-22 ENCOUNTER — Ambulatory Visit (INDEPENDENT_AMBULATORY_CARE_PROVIDER_SITE_OTHER): Payer: Medicare Other

## 2019-09-22 ENCOUNTER — Encounter: Payer: Self-pay | Admitting: Orthopedic Surgery

## 2019-09-22 ENCOUNTER — Other Ambulatory Visit: Payer: Self-pay

## 2019-09-22 ENCOUNTER — Ambulatory Visit (INDEPENDENT_AMBULATORY_CARE_PROVIDER_SITE_OTHER): Payer: Medicare Other | Admitting: Orthopedic Surgery

## 2019-09-22 DIAGNOSIS — Z8781 Personal history of (healed) traumatic fracture: Secondary | ICD-10-CM

## 2019-09-22 DIAGNOSIS — M5410 Radiculopathy, site unspecified: Secondary | ICD-10-CM

## 2019-09-22 MED ORDER — METHOCARBAMOL 500 MG PO TABS: 500.0000 mg | ORAL_TABLET | Freq: Three times a day (TID) | ORAL | 0 refills | Status: DC | PRN

## 2019-09-22 NOTE — Progress Notes (Signed)
Post-Op Visit Note   Patient: Richard Glass           Date of Birth: 07/09/1971           MRN: [id number] Visit Date: 09/22/2019 PCP: [name], [name], MD   Assessment & Plan:  Chief Complaint:  Chief Complaint  Patient presents with  . Right Hip - Follow-up   Visit Diagnoses:  1. S/P right hip fracture   2. Radicular leg pain     Plan: Richard Glass is a patient with right hip pinning done 06/27/2019.  He is having some whole right leg symptoms radiating with numbness down into the calf region.  CT scan in October was negative for fracture.  He does not have any type of pacemaker.  He states that when he is walking his right leg will go numb.  Is hard for him to get comfortable.  On exam he has mild groin pain with internal/external rotation of the right-hand side.  In general he is pretty touchy about any type of manipulation of either leg.  Leg lengths slightly shorter on the right compared to the left by about 1 cm.  He has 5 out of 5 hip flexion strength on the left compared to 4 out of 5 on the right.  Impression is possible right-sided radiculopathy.  Plan is refill Robaxin and MRI lumbar spine to evaluate right-sided radiculopathy.  If that is negative then I may have him see one of my partners about hip replacement.  Fracture itself looks healed but I think he could be having some symptoms related to the fracture itself.  Follow-Up Instructions: Return for after MRI.   Orders:  Orders Placed This Encounter  Procedures  . XR HIP UNILAT W OR W/O PELVIS 2-3 VIEWS RIGHT  . MR Lumbar Spine w/o contrast   Meds ordered this encounter  Medications  . methocarbamol (ROBAXIN) 500 MG tablet    Sig: Take 1 tablet (500 mg total) by mouth every 8 (eight) hours as needed for muscle spasms.    Dispense:  30 tablet    Refill:  0    Imaging: XR HIP UNILAT W OR W/O PELVIS 2-3 VIEWS RIGHT  Result Date: 09/22/2019 AP pelvis lateral right hip reviewed.  10 fixation of femoral neck  fracture in good position alignment with no significant change compared to radiographs approximately 6 weeks ago.  There is slight leg shortening and fracture settling.  No evidence of hardware loosening or avascular necrosis.   PMFS History: Patient Active Problem List   Diagnosis Date Noted  . Bilateral leg weakness 09/03/2019  . S/P right hip fracture 07/07/2019  . Right leg pain 06/26/2019  . Femoral fracture ([place]) 06/26/2019  . Hearing loss 05/01/2019  . Chronic left leg pain 11/20/2018  . Allodynia, left leg 11/20/2018  . Hamstring muscle strain, left, initial encounter 06/26/2018  . History of traumatic brain injury 02/23/2018  . High risk medication use, Combination of Opioid and Benzodiazapine 02/12/2018  . 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 SI (sacroiliac) pain 03/20/2017  . Enchondroma of left fibular head 05/17/2016  . Chronic pain syndrome 01/18/2016  . Anxiety state 08/08/2013  . Insomnia 02/02/2013  . Right flank pain 05/21/2011  . ASTHMA, INTERMITTENT 07/08/2010  . Intention tremor 10/31/2009  . Encounter for chronic pain management 07/04/2009  . Overweight(278.02) 06/13/2009  . Restless leg 09/29/2007  . Hyperlipidemia 11/13/2006  . Allergic rhinitis 11/13/2006  . GASTROESOPHAGEAL  REFLUX, NO ESOPHAGITIS 11/13/2006  . Low back pain 11/13/2006  . Convulsions ([place]) 11/13/2006   Past Medical History:  Diagnosis Date  . 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  . Allergic rhinitis 11/13/2006       . Anxiety   . Anxiety state 08/08/2013  . Asthma   . ASTHMA, INTERMITTENT 07/08/2010   Qualifier: Diagnosis of  By: Walker Kehr MD, Patrick Jupiter    . Back pain of lumbar region with sciatica 11/13/2006   MRI 12/2014 - lumbar DDD without focal neural impingement  MRI Lumbar 10/30/16 ([place]) Small central L5-S1 disc extrusion with minimal cranial  migration and associated annular fissure approaches descending left S1 nerve roots without contact or displacement. Minimal bulging disc height loss without spinal canal stenosis or neural foraminal narrowing.  Minimal bulging disc at L2-L3 through L4-L5 without spinal canal stenosis or neural foraminal narrowing.  MRI Sacrum 10/30/16 ([place]) No fracture. Small lesion left sacral ala most likely a subchondral cyst/erosion adjacent to the sacroiliac joint.     . 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 Carpal tunnel syndrome 01/09/2015   Left 12/2014   . Cervical spine pain 02/06/2015   MRI 03/2015 FINDINGS: Vertebral body height, signal and alignment are normal. The craniocervical junction is normal and cervical cord signal is normal. The central spinal canal and neural foramina are widely patent at all levels. Scattered, mild degenerative change appears most notable at C4-5. Imaged paraspinous structures are unremarkable.  IMPRESSION: Negative for central canal or foraminal narrowing. No finding to explain the patient's symptoms. Scattered, mild facet degenerative disease is noted.   . Chronic low back pain   . Chronic pain 01/18/2016  . Chronic pain syndrome 01/18/2016  . Coccyx pain 02/06/2015  . Contact dermatitis or eczema 02/13/2018  . Convulsions ([place]) 11/13/2006     Cystic encephalomalacia left temporal and parietal regions from remote injury.   . Degenerative arthritis   . Dyslipidemia   . External hemorrhoid, bleeding 06/04/2011  . GASTROESOPHAGEAL REFLUX, NO ESOPHAGITIS 11/13/2006   Qualifier: Diagnosis of  By: Eusebio Friendly    . Hamstring muscle strain, left, initial encounter 06/26/2018  . Headache(784.0)   . Hyperlipidemia 11/13/2006   07/2016  ASCVD score of 4.7% - recommended lifestyle changes   . Insomnia 02/02/2013  . Intention tremor 10/31/2009   Qualifier: Diagnosis of  By: Walker Kehr MD, Patrick Jupiter    . Intercostal pain 04/25/2015  . Left foot pain  08/21/2018  . Left knee injury 05/09/2016  . 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 Morton's neuroma of left foot 01/18/2016  . Osteopenia 10/10/2016  . Pain in joint, shoulder region 07/05/2014   LEFT > Right Reports hx rotator cuff surgery on rigt shoulder previously (Dr Nicholes Stairs) but no notes available XRAY right shoulder showed some proliferative changes distal rigt clavicle   . Rash and nonspecific skin eruption 10/10/2016  . Restless leg 09/29/2007   Qualifier: Diagnosis of  By: Walker Kehr MD, Patrick Jupiter    . Restless legs syndrome (RLS)   . Sciatica of left side 10/26/2014  . Seizures ([place])    last >16yrs  . SI (sacroiliac) pain 03/20/2017  . Sinusitis chronic, frontal   . Status post lumbar spinal fusion 03/20/2017  . Subacromial or subdeltoid bursitis 08/08/2009   MRI C-spine 2011([facility]) - normal MRI left shoulder 10/2010 ([facility]) - AC degenerative disease, normal rotator cuff 12/2012 -debridement,  acromioplasty and distal clavicle excision of left shoulder, arthroscopy also performed an no need for rotator cuff repair - performed by [facility] 02/2013 - Nerve Conduction Studies ([facility]) - ulnar nerve compression 02/2013- taken back to OR for Ulnar nerve decompression 06/2013 Repeat MRI left shoulder - subacromial/subdeltoid bursitis, a small intersubstance tear to the distal infraspinatus and evidence of interval resection of the distal clavicle 06/2013 - steroid injection of left biceps 09/2013 -Repeat EMG showed improvement of ulnar nerve compression 10/2013 - referred to [facility] for second opinion due to intractable pain 11/2013 - Seen by Dr. Marlou Sa at [facility]; discussed risks/benefits of surgical intervention and bursectomy, no plan for surgery at this time     . 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 Cancer Father          lung  . Heart disease Father   . Hypertension Mother   . Seizures Neg Hx     Past Surgical History:  Procedure Laterality Date  . [place]   struck by golf ball-49 yrs old  . CRANIOTOMY     metal plate  . HIP PINNING,CANNULATED Right 06/27/2019   Procedure: HIP PERCUTANEOUS PINNING;  Surgeon: Meredith Pel, MD;  Location: [facility];  Service: Orthopedics;  Laterality: Right;  . NASAL FRACTURE SURGERY  1997  . SACROILIAC JOINT FUSION Left 03/20/2017   Procedure: SACROILIAC JOINT FUSION;  Surgeon: Melina Schools, MD;  Location: [facility];  Service: Orthopedics;  Laterality: Left;  90 mins  . SHOULDER ARTHROSCOPY Left    "cleaned arthritis out"  . SHOULDER SURGERY Right 2012   Rotator cuff surgery  . TONSILLECTOMY    . UVULOPALATOPHARYNGOPLASTY (UPPP)/TONSILLECTOMY/SEPTOPLASTY  2001   Social History   Occupational History  . Occupation: disabled  Tobacco Use  . Smoking status: Former Smoker    Packs/day: 1.00    Years: 22.00    Pack years: 22.00    Types: Cigarettes    Quit date: 06/22/2012    Years since quitting: 7.2  . Smokeless tobacco: Current User    Types: Snuff  Substance and Sexual Activity  . Alcohol use: No    Alcohol/week: 0.0 standard drinks    Comment: none since 7/11  . Drug use: No  . Sexual activity: Never

## 2019-09-23 DIAGNOSIS — J4520 Mild intermittent asthma, uncomplicated: Secondary | ICD-10-CM | POA: Diagnosis not present

## 2019-09-23 DIAGNOSIS — Z96641 Presence of right artificial hip joint: Secondary | ICD-10-CM | POA: Diagnosis not present

## 2019-09-23 DIAGNOSIS — K219 Gastro-esophageal reflux disease without esophagitis: Secondary | ICD-10-CM | POA: Diagnosis not present

## 2019-09-23 DIAGNOSIS — Z7901 Long term (current) use of anticoagulants: Secondary | ICD-10-CM | POA: Diagnosis not present

## 2019-09-23 DIAGNOSIS — G4700 Insomnia, unspecified: Secondary | ICD-10-CM | POA: Diagnosis not present

## 2019-09-23 DIAGNOSIS — G2581 Restless legs syndrome: Secondary | ICD-10-CM | POA: Diagnosis not present

## 2019-09-23 DIAGNOSIS — I10 Essential (primary) hypertension: Secondary | ICD-10-CM | POA: Diagnosis not present

## 2019-09-23 DIAGNOSIS — G894 Chronic pain syndrome: Secondary | ICD-10-CM | POA: Diagnosis not present

## 2019-09-23 DIAGNOSIS — Z87820 Personal history of traumatic brain injury: Secondary | ICD-10-CM | POA: Diagnosis not present

## 2019-09-23 DIAGNOSIS — R208 Other disturbances of skin sensation: Secondary | ICD-10-CM | POA: Diagnosis not present

## 2019-09-23 DIAGNOSIS — R569 Unspecified convulsions: Secondary | ICD-10-CM | POA: Diagnosis not present

## 2019-09-23 DIAGNOSIS — E785 Hyperlipidemia, unspecified: Secondary | ICD-10-CM | POA: Diagnosis not present

## 2019-09-23 DIAGNOSIS — Z9181 History of falling: Secondary | ICD-10-CM | POA: Diagnosis not present

## 2019-09-23 DIAGNOSIS — M533 Sacrococcygeal disorders, not elsewhere classified: Secondary | ICD-10-CM | POA: Diagnosis not present

## 2019-09-23 DIAGNOSIS — M8580 Other specified disorders of bone density and structure, unspecified site: Secondary | ICD-10-CM | POA: Diagnosis not present

## 2019-09-23 DIAGNOSIS — G252 Other specified forms of tremor: Secondary | ICD-10-CM | POA: Diagnosis not present

## 2019-09-23 DIAGNOSIS — M1990 Unspecified osteoarthritis, unspecified site: Secondary | ICD-10-CM | POA: Diagnosis not present

## 2019-09-23 DIAGNOSIS — S72011D Unspecified intracapsular fracture of right femur, subsequent encounter for closed fracture with routine healing: Secondary | ICD-10-CM | POA: Diagnosis not present

## 2019-09-23 DIAGNOSIS — J302 Other seasonal allergic rhinitis: Secondary | ICD-10-CM | POA: Diagnosis not present

## 2019-09-23 DIAGNOSIS — H9190 Unspecified hearing loss, unspecified ear: Secondary | ICD-10-CM | POA: Diagnosis not present

## 2019-09-23 DIAGNOSIS — Z87891 Personal history of nicotine dependence: Secondary | ICD-10-CM | POA: Diagnosis not present

## 2019-09-23 DIAGNOSIS — M5136 Other intervertebral disc degeneration, lumbar region: Secondary | ICD-10-CM | POA: Diagnosis not present

## 2019-09-24 DIAGNOSIS — M542 Cervicalgia: Secondary | ICD-10-CM | POA: Diagnosis not present

## 2019-09-27 ENCOUNTER — Telehealth: Payer: Self-pay | Admitting: Orthopedic Surgery

## 2019-09-27 NOTE — Telephone Encounter (Signed)
Spoke with patient concerning scheduling an appointment with Dr Marlou Sa for MRI review    Patient said she is out of town and will have to check her calendar and call me back to schedule the MRI review.  Will wait for call back

## 2019-09-28 DIAGNOSIS — J4520 Mild intermittent asthma, uncomplicated: Secondary | ICD-10-CM | POA: Diagnosis not present

## 2019-09-28 DIAGNOSIS — R208 Other disturbances of skin sensation: Secondary | ICD-10-CM | POA: Diagnosis not present

## 2019-09-28 DIAGNOSIS — H9190 Unspecified hearing loss, unspecified ear: Secondary | ICD-10-CM | POA: Diagnosis not present

## 2019-09-28 DIAGNOSIS — Z96641 Presence of right artificial hip joint: Secondary | ICD-10-CM | POA: Diagnosis not present

## 2019-09-28 DIAGNOSIS — I10 Essential (primary) hypertension: Secondary | ICD-10-CM | POA: Diagnosis not present

## 2019-09-28 DIAGNOSIS — Z87820 Personal history of traumatic brain injury: Secondary | ICD-10-CM | POA: Diagnosis not present

## 2019-09-28 DIAGNOSIS — G894 Chronic pain syndrome: Secondary | ICD-10-CM | POA: Diagnosis not present

## 2019-09-28 DIAGNOSIS — G2581 Restless legs syndrome: Secondary | ICD-10-CM | POA: Diagnosis not present

## 2019-09-28 DIAGNOSIS — M8580 Other specified disorders of bone density and structure, unspecified site: Secondary | ICD-10-CM | POA: Diagnosis not present

## 2019-09-28 DIAGNOSIS — E785 Hyperlipidemia, unspecified: Secondary | ICD-10-CM | POA: Diagnosis not present

## 2019-09-28 DIAGNOSIS — G252 Other specified forms of tremor: Secondary | ICD-10-CM | POA: Diagnosis not present

## 2019-09-28 DIAGNOSIS — K219 Gastro-esophageal reflux disease without esophagitis: Secondary | ICD-10-CM | POA: Diagnosis not present

## 2019-09-28 DIAGNOSIS — R569 Unspecified convulsions: Secondary | ICD-10-CM | POA: Diagnosis not present

## 2019-09-28 DIAGNOSIS — Z9181 History of falling: Secondary | ICD-10-CM | POA: Diagnosis not present

## 2019-09-28 DIAGNOSIS — M1990 Unspecified osteoarthritis, unspecified site: Secondary | ICD-10-CM | POA: Diagnosis not present

## 2019-09-28 DIAGNOSIS — G4700 Insomnia, unspecified: Secondary | ICD-10-CM | POA: Diagnosis not present

## 2019-09-28 DIAGNOSIS — Z7901 Long term (current) use of anticoagulants: Secondary | ICD-10-CM | POA: Diagnosis not present

## 2019-09-28 DIAGNOSIS — J302 Other seasonal allergic rhinitis: Secondary | ICD-10-CM | POA: Diagnosis not present

## 2019-09-28 DIAGNOSIS — M5136 Other intervertebral disc degeneration, lumbar region: Secondary | ICD-10-CM | POA: Diagnosis not present

## 2019-09-28 DIAGNOSIS — M533 Sacrococcygeal disorders, not elsewhere classified: Secondary | ICD-10-CM | POA: Diagnosis not present

## 2019-09-28 DIAGNOSIS — Z87891 Personal history of nicotine dependence: Secondary | ICD-10-CM | POA: Diagnosis not present

## 2019-09-28 DIAGNOSIS — S72011D Unspecified intracapsular fracture of right femur, subsequent encounter for closed fracture with routine healing: Secondary | ICD-10-CM | POA: Diagnosis not present

## 2019-09-29 DIAGNOSIS — R208 Other disturbances of skin sensation: Secondary | ICD-10-CM | POA: Diagnosis not present

## 2019-09-29 DIAGNOSIS — Z7901 Long term (current) use of anticoagulants: Secondary | ICD-10-CM | POA: Diagnosis not present

## 2019-09-29 DIAGNOSIS — K219 Gastro-esophageal reflux disease without esophagitis: Secondary | ICD-10-CM | POA: Diagnosis not present

## 2019-09-29 DIAGNOSIS — M1990 Unspecified osteoarthritis, unspecified site: Secondary | ICD-10-CM | POA: Diagnosis not present

## 2019-09-29 DIAGNOSIS — I10 Essential (primary) hypertension: Secondary | ICD-10-CM | POA: Diagnosis not present

## 2019-09-29 DIAGNOSIS — Z87891 Personal history of nicotine dependence: Secondary | ICD-10-CM | POA: Diagnosis not present

## 2019-09-29 DIAGNOSIS — S72011D Unspecified intracapsular fracture of right femur, subsequent encounter for closed fracture with routine healing: Secondary | ICD-10-CM | POA: Diagnosis not present

## 2019-09-29 DIAGNOSIS — E785 Hyperlipidemia, unspecified: Secondary | ICD-10-CM | POA: Diagnosis not present

## 2019-09-29 DIAGNOSIS — H9190 Unspecified hearing loss, unspecified ear: Secondary | ICD-10-CM | POA: Diagnosis not present

## 2019-09-29 DIAGNOSIS — Z9181 History of falling: Secondary | ICD-10-CM | POA: Diagnosis not present

## 2019-09-29 DIAGNOSIS — M8580 Other specified disorders of bone density and structure, unspecified site: Secondary | ICD-10-CM | POA: Diagnosis not present

## 2019-09-29 DIAGNOSIS — G252 Other specified forms of tremor: Secondary | ICD-10-CM | POA: Diagnosis not present

## 2019-09-29 DIAGNOSIS — J302 Other seasonal allergic rhinitis: Secondary | ICD-10-CM | POA: Diagnosis not present

## 2019-09-29 DIAGNOSIS — M533 Sacrococcygeal disorders, not elsewhere classified: Secondary | ICD-10-CM | POA: Diagnosis not present

## 2019-09-29 DIAGNOSIS — Z96641 Presence of right artificial hip joint: Secondary | ICD-10-CM | POA: Diagnosis not present

## 2019-09-29 DIAGNOSIS — G894 Chronic pain syndrome: Secondary | ICD-10-CM | POA: Diagnosis not present

## 2019-09-29 DIAGNOSIS — G4700 Insomnia, unspecified: Secondary | ICD-10-CM | POA: Diagnosis not present

## 2019-09-29 DIAGNOSIS — J4520 Mild intermittent asthma, uncomplicated: Secondary | ICD-10-CM | POA: Diagnosis not present

## 2019-09-29 DIAGNOSIS — Z87820 Personal history of traumatic brain injury: Secondary | ICD-10-CM | POA: Diagnosis not present

## 2019-09-29 DIAGNOSIS — G2581 Restless legs syndrome: Secondary | ICD-10-CM | POA: Diagnosis not present

## 2019-09-29 DIAGNOSIS — R569 Unspecified convulsions: Secondary | ICD-10-CM | POA: Diagnosis not present

## 2019-09-29 DIAGNOSIS — M5136 Other intervertebral disc degeneration, lumbar region: Secondary | ICD-10-CM | POA: Diagnosis not present

## 2019-09-30 ENCOUNTER — Other Ambulatory Visit: Payer: Self-pay

## 2019-10-01 ENCOUNTER — Other Ambulatory Visit: Payer: Self-pay | Admitting: Family Medicine

## 2019-10-01 ENCOUNTER — Other Ambulatory Visit: Payer: Self-pay | Admitting: Neurology

## 2019-10-01 DIAGNOSIS — G894 Chronic pain syndrome: Secondary | ICD-10-CM

## 2019-10-01 DIAGNOSIS — M79605 Pain in left leg: Secondary | ICD-10-CM

## 2019-10-01 DIAGNOSIS — G8929 Other chronic pain: Secondary | ICD-10-CM

## 2019-10-01 DIAGNOSIS — M533 Sacrococcygeal disorders, not elsewhere classified: Secondary | ICD-10-CM

## 2019-10-01 MED ORDER — HYDROCODONE-ACETAMINOPHEN 7.5-325 MG PO TABS: 1.0000 | ORAL_TABLET | Freq: Four times a day (QID) | ORAL | 0 refills | Status: DC | PRN

## 2019-10-02 ENCOUNTER — Other Ambulatory Visit: Payer: Self-pay | Admitting: Family Medicine

## 2019-10-02 ENCOUNTER — Other Ambulatory Visit: Payer: Self-pay

## 2019-10-02 ENCOUNTER — Ambulatory Visit
Admission: RE | Admit: 2019-10-02 | Discharge: 2019-10-02 | Disposition: A | Discharge status: Home / Self Care | Payer: Medicare Other | Source: Ambulatory Visit | Attending: Orthopedic Surgery | Admitting: Orthopedic Surgery

## 2019-10-02 DIAGNOSIS — M5410 Radiculopathy, site unspecified: Secondary | ICD-10-CM

## 2019-10-02 DIAGNOSIS — M5126 Other intervertebral disc displacement, lumbar region: Secondary | ICD-10-CM | POA: Diagnosis not present

## 2019-10-02 DIAGNOSIS — M5440 Lumbago with sciatica, unspecified side: Secondary | ICD-10-CM

## 2019-10-05 ENCOUNTER — Encounter: Payer: Self-pay | Admitting: Neurology

## 2019-10-05 ENCOUNTER — Other Ambulatory Visit: Payer: Self-pay

## 2019-10-05 ENCOUNTER — Ambulatory Visit (INDEPENDENT_AMBULATORY_CARE_PROVIDER_SITE_OTHER): Payer: Medicare Other | Admitting: Neurology

## 2019-10-05 DIAGNOSIS — Z7901 Long term (current) use of anticoagulants: Secondary | ICD-10-CM | POA: Diagnosis not present

## 2019-10-05 DIAGNOSIS — G894 Chronic pain syndrome: Secondary | ICD-10-CM | POA: Diagnosis not present

## 2019-10-05 DIAGNOSIS — M533 Sacrococcygeal disorders, not elsewhere classified: Secondary | ICD-10-CM | POA: Diagnosis not present

## 2019-10-05 DIAGNOSIS — Z87891 Personal history of nicotine dependence: Secondary | ICD-10-CM | POA: Diagnosis not present

## 2019-10-05 DIAGNOSIS — R208 Other disturbances of skin sensation: Secondary | ICD-10-CM | POA: Diagnosis not present

## 2019-10-05 DIAGNOSIS — R531 Weakness: Secondary | ICD-10-CM | POA: Diagnosis not present

## 2019-10-05 DIAGNOSIS — G2581 Restless legs syndrome: Secondary | ICD-10-CM | POA: Diagnosis not present

## 2019-10-05 DIAGNOSIS — M5136 Other intervertebral disc degeneration, lumbar region: Secondary | ICD-10-CM | POA: Diagnosis not present

## 2019-10-05 DIAGNOSIS — R569 Unspecified convulsions: Secondary | ICD-10-CM | POA: Diagnosis not present

## 2019-10-05 DIAGNOSIS — G8929 Other chronic pain: Secondary | ICD-10-CM

## 2019-10-05 DIAGNOSIS — I10 Essential (primary) hypertension: Secondary | ICD-10-CM | POA: Diagnosis not present

## 2019-10-05 DIAGNOSIS — Z96641 Presence of right artificial hip joint: Secondary | ICD-10-CM | POA: Diagnosis not present

## 2019-10-05 DIAGNOSIS — Z87820 Personal history of traumatic brain injury: Secondary | ICD-10-CM | POA: Diagnosis not present

## 2019-10-05 DIAGNOSIS — S72011D Unspecified intracapsular fracture of right femur, subsequent encounter for closed fracture with routine healing: Secondary | ICD-10-CM | POA: Diagnosis not present

## 2019-10-05 DIAGNOSIS — J302 Other seasonal allergic rhinitis: Secondary | ICD-10-CM | POA: Diagnosis not present

## 2019-10-05 DIAGNOSIS — M5441 Lumbago with sciatica, right side: Secondary | ICD-10-CM

## 2019-10-05 DIAGNOSIS — J4520 Mild intermittent asthma, uncomplicated: Secondary | ICD-10-CM | POA: Diagnosis not present

## 2019-10-05 DIAGNOSIS — M1990 Unspecified osteoarthritis, unspecified site: Secondary | ICD-10-CM | POA: Diagnosis not present

## 2019-10-05 DIAGNOSIS — M8580 Other specified disorders of bone density and structure, unspecified site: Secondary | ICD-10-CM | POA: Diagnosis not present

## 2019-10-05 DIAGNOSIS — G252 Other specified forms of tremor: Secondary | ICD-10-CM | POA: Diagnosis not present

## 2019-10-05 DIAGNOSIS — Z9181 History of falling: Secondary | ICD-10-CM | POA: Diagnosis not present

## 2019-10-05 DIAGNOSIS — E785 Hyperlipidemia, unspecified: Secondary | ICD-10-CM | POA: Diagnosis not present

## 2019-10-05 DIAGNOSIS — K219 Gastro-esophageal reflux disease without esophagitis: Secondary | ICD-10-CM | POA: Diagnosis not present

## 2019-10-05 DIAGNOSIS — G4700 Insomnia, unspecified: Secondary | ICD-10-CM | POA: Diagnosis not present

## 2019-10-05 DIAGNOSIS — H9190 Unspecified hearing loss, unspecified ear: Secondary | ICD-10-CM | POA: Diagnosis not present

## 2019-10-05 NOTE — Progress Notes (Signed)
Please refer to EMG and nerve conduction procedure note.  

## 2019-10-05 NOTE — Procedures (Signed)
HISTORY:  Richard Glass is a 49 year old gentleman who had a fall off a ladder in October 2020.  The patient suffered a right hip fracture, he now complains of pain down the right leg and anesthesia to soft touch on the right leg, tremors in both arms, some discomfort in the right greater than left arm.  He has had significant weight loss over the last 9 months.  He is being evaluated for the above issues.  NERVE CONDUCTION STUDIES:  Nerve conduction studies were performed on the right upper extremity.  The distal motor latency for the right median nerve was normal, slightly prolonged for the right ulnar nerve.  The motor amplitudes for the right median and ulnar nerves were normal with normal nerve conduction velocity seen.  The sensory latencies for the right radial nerve and right median nerve were normal, slight prolongation of the right ulnar sensory latency was seen.  The right ulnar F-wave latency was normal.  Nerve conduction studies were performed on both lower extremities.  The distal motor latencies motor branches for the peroneal and posterior tibial nerves were normal bilaterally with some slowing seen for the right peroneal nerve, normal nerve conductions of the left peroneal nerve and for the posterior tibial nerves bilaterally.  The sensory latencies for the sural and peroneal nerves were normal bilaterally.  The F-wave latencies for the posterior tibial nerves were slightly prolonged on the right, normal on the left.  EMG STUDIES:  EMG study was performed on the right upper extremity:  The first dorsal interosseous muscle reveals 2 to 4 K units with full recruitment. No fibrillations or positive waves were noted. The abductor pollicis brevis muscle reveals 2 to 4 K units with full recruitment. No fibrillations or positive waves were noted. The extensor indicis proprius muscle reveals 1 to 3 K units with full recruitment. No fibrillations or positive waves were noted. The  pronator teres muscle reveals 2 to 3 K units with full recruitment. No fibrillations or positive waves were noted. The biceps muscle reveals 1 to 2 K units with full recruitment. No fibrillations or positive waves were noted. The triceps muscle reveals 2 to 4 K units with full recruitment. No fibrillations or positive waves were noted. The anterior deltoid muscle reveals 2 to 3 K units with full recruitment. No fibrillations or positive waves were noted. The cervical paraspinal muscles were tested at 2 levels. No abnormalities of insertional activity were seen at either level tested. There was good relaxation.  EMG study was performed on the right lower extremity:  The tibialis anterior muscle reveals 2 to 4K motor units with full recruitment. No fibrillations or positive waves were seen. The peroneus tertius muscle reveals 2 to 4K motor units with full recruitment. No fibrillations or positive waves were seen. The medial gastrocnemius muscle reveals 1 to 3K motor units with full recruitment. No fibrillations or positive waves were seen. The vastus lateralis muscle reveals 2 to 4K motor units with full recruitment. No fibrillations or positive waves were seen. The iliopsoas muscle reveals 2 to 4K motor units with full recruitment. No fibrillations or positive waves were seen. The biceps femoris muscle (long head) reveals 2 to 4K motor units with full recruitment. No fibrillations or positive waves were seen. The lumbosacral paraspinal muscles were tested at 3 levels, and revealed no abnormalities of insertional activity at all 3 levels tested. There was poor relaxation.   IMPRESSION:  Nerve conduction studies done on the right upper extremity  shows some distal dysfunction of the right ulnar nerve.  Nerve conduction studies of the lower extremities were relatively unremarkable with exception of slight slowing seen for the right peroneal nerve.  There is no evidence of a generalized peripheral  neuropathy.  EMG evaluation of the right upper and right lower extremities were unremarkable, there is no evidence of an overlying cervical or lumbosacral radiculopathy on this side.  Jill Alexanders MD 10/05/2019 3:28 PM  [facility] [address] [place], [place] [address]  Phone [phone] Fax [phone]

## 2019-10-05 NOTE — Progress Notes (Addendum)
The patient comes for EMG nerve conduction study evaluation today.  He had a fall off of a ladder with subsequent right hip fracture and right shoulder injury, since that time, he has had tremors of both arms, discomfort in the right arm, severe pain down the right leg, he reports anesthesia to light touch on the right leg up to the hip level, decreased sensation in the left leg.  He has had MRI of the lumbar spine and cervical spine done.  He is sent for urgent evaluation today.  He has had significant weight loss over the last 9 months, decreased appetite.      [place]    Nerve / Sites Muscle Latency Ref. Amplitude Ref. Rel Amp Segments Distance Velocity Ref. Area    ms ms mV mV %  cm m/s m/s mVms  R Median - APB     Wrist APB 4.3 ?4.4 6.3 ?4.0 100 Wrist - APB 7   36.7     Upper arm APB 9.0  5.3  84.1 Upper arm - Wrist 24 51 ?49 31.2  R Ulnar - ADM     Wrist ADM 3.7 ?3.3 7.4 ?6.0 100 Wrist - ADM 7   35.0     B.Elbow ADM 7.7  6.8  92 B.Elbow - Wrist 21 52 ?49 32.3     A.Elbow ADM 9.6  6.9  101 A.Elbow - B.Elbow 10 52 ?49 32.8         A.Elbow - Wrist      R Peroneal - EDB     Ankle EDB 5.5 ?6.5 2.0 ?2.0 100 Ankle - EDB 9   10.4     Fib head EDB 13.0  1.9  98.5 Fib head - Ankle 30 40 ?44 10.8     Pop fossa EDB 15.5  2.1  108 Pop fossa - Fib head 10 40 ?44 10.7         Pop fossa - Ankle      L Peroneal - EDB     Ankle EDB 4.8 ?6.5 3.4 ?2.0 100 Ankle - EDB 9   13.7     Fib head EDB 11.9  3.2  92.4 Fib head - Ankle 31 44 ?44 11.3     Pop fossa EDB 14.2  2.8  88.7 Pop fossa - Fib head 10 44 ?44 11.9         Pop fossa - Ankle      R Tibial - AH     Ankle AH 5.2 ?5.8 7.6 ?4.0 100 Ankle - AH 9   21.8     Pop fossa AH 15.3  4.0  52.2 Pop fossa - Ankle 41 41 ?41 22.2  L Tibial - AH     Ankle AH 4.4 ?5.8 12.1 ?4.0 100 Ankle - AH 9   25.2     Pop fossa AH 14.0  7.9  65.3 Pop fossa - Ankle 40 41 ?41 17.5                   SNC    Nerve / Sites Rec. Site Peak Lat Ref.  Amp Ref. Segments Distance     ms ms V V  cm  R Radial - Anatomical snuff box (Forearm)     Forearm Wrist 2.6 ?2.9 42 ?15 Forearm - Wrist 10  R Sural - Ankle (Calf)     Calf Ankle 4.0 ?4.4 20 ?6 Calf - Ankle 14  L Sural - Ankle (Calf)  Calf Ankle 3.8 ?4.4 18 ?6 Calf - Ankle 14  R Superficial peroneal - Ankle     Lat leg Ankle 4.2 ?4.4 19 ?6 Lat leg - Ankle 14  L Superficial peroneal - Ankle     Lat leg Ankle 3.8 ?4.4 24 ?6 Lat leg - Ankle 14  R Median - Orthodromic (Dig II, Mid palm)     Dig II Wrist 3.3 ?3.4 15 ?10 Dig II - Wrist 13  R Ulnar - Orthodromic, (Dig V, Mid palm)     Dig V Wrist 3.3 ?3.1 5 ?5 Dig V - Wrist 19                   F  Wave    Nerve F Lat Ref.   ms ms  R Tibial - AH 58.2 ?56.0  R Ulnar - ADM 31.6 ?32.0  L Tibial - AH 53.5 ?56.0

## 2019-10-06 DIAGNOSIS — Z87891 Personal history of nicotine dependence: Secondary | ICD-10-CM | POA: Diagnosis not present

## 2019-10-06 DIAGNOSIS — S72011D Unspecified intracapsular fracture of right femur, subsequent encounter for closed fracture with routine healing: Secondary | ICD-10-CM | POA: Diagnosis not present

## 2019-10-06 DIAGNOSIS — Z96641 Presence of right artificial hip joint: Secondary | ICD-10-CM | POA: Diagnosis not present

## 2019-10-06 DIAGNOSIS — G894 Chronic pain syndrome: Secondary | ICD-10-CM | POA: Diagnosis not present

## 2019-10-06 DIAGNOSIS — M533 Sacrococcygeal disorders, not elsewhere classified: Secondary | ICD-10-CM | POA: Diagnosis not present

## 2019-10-06 DIAGNOSIS — M1990 Unspecified osteoarthritis, unspecified site: Secondary | ICD-10-CM | POA: Diagnosis not present

## 2019-10-06 DIAGNOSIS — R569 Unspecified convulsions: Secondary | ICD-10-CM | POA: Diagnosis not present

## 2019-10-06 DIAGNOSIS — H9190 Unspecified hearing loss, unspecified ear: Secondary | ICD-10-CM | POA: Diagnosis not present

## 2019-10-06 DIAGNOSIS — K219 Gastro-esophageal reflux disease without esophagitis: Secondary | ICD-10-CM | POA: Diagnosis not present

## 2019-10-06 DIAGNOSIS — J302 Other seasonal allergic rhinitis: Secondary | ICD-10-CM | POA: Diagnosis not present

## 2019-10-06 DIAGNOSIS — Z87820 Personal history of traumatic brain injury: Secondary | ICD-10-CM | POA: Diagnosis not present

## 2019-10-06 DIAGNOSIS — M5136 Other intervertebral disc degeneration, lumbar region: Secondary | ICD-10-CM | POA: Diagnosis not present

## 2019-10-06 DIAGNOSIS — G4700 Insomnia, unspecified: Secondary | ICD-10-CM | POA: Diagnosis not present

## 2019-10-06 DIAGNOSIS — Z7901 Long term (current) use of anticoagulants: Secondary | ICD-10-CM | POA: Diagnosis not present

## 2019-10-06 DIAGNOSIS — G252 Other specified forms of tremor: Secondary | ICD-10-CM | POA: Diagnosis not present

## 2019-10-06 DIAGNOSIS — G2581 Restless legs syndrome: Secondary | ICD-10-CM | POA: Diagnosis not present

## 2019-10-06 DIAGNOSIS — J4520 Mild intermittent asthma, uncomplicated: Secondary | ICD-10-CM | POA: Diagnosis not present

## 2019-10-06 DIAGNOSIS — R208 Other disturbances of skin sensation: Secondary | ICD-10-CM | POA: Diagnosis not present

## 2019-10-06 DIAGNOSIS — Z9181 History of falling: Secondary | ICD-10-CM | POA: Diagnosis not present

## 2019-10-06 DIAGNOSIS — E785 Hyperlipidemia, unspecified: Secondary | ICD-10-CM | POA: Diagnosis not present

## 2019-10-06 DIAGNOSIS — M8580 Other specified disorders of bone density and structure, unspecified site: Secondary | ICD-10-CM | POA: Diagnosis not present

## 2019-10-06 DIAGNOSIS — I10 Essential (primary) hypertension: Secondary | ICD-10-CM | POA: Diagnosis not present

## 2019-10-07 ENCOUNTER — Other Ambulatory Visit: Payer: Self-pay | Admitting: *Deleted

## 2019-10-07 MED ORDER — TOPIRAMATE 50 MG PO TABS: ORAL_TABLET | ORAL | 1 refills | Status: DC

## 2019-10-12 DIAGNOSIS — I10 Essential (primary) hypertension: Secondary | ICD-10-CM | POA: Diagnosis not present

## 2019-10-12 DIAGNOSIS — J4520 Mild intermittent asthma, uncomplicated: Secondary | ICD-10-CM | POA: Diagnosis not present

## 2019-10-12 DIAGNOSIS — G2581 Restless legs syndrome: Secondary | ICD-10-CM | POA: Diagnosis not present

## 2019-10-12 DIAGNOSIS — M1990 Unspecified osteoarthritis, unspecified site: Secondary | ICD-10-CM | POA: Diagnosis not present

## 2019-10-12 DIAGNOSIS — E785 Hyperlipidemia, unspecified: Secondary | ICD-10-CM | POA: Diagnosis not present

## 2019-10-12 DIAGNOSIS — S72011D Unspecified intracapsular fracture of right femur, subsequent encounter for closed fracture with routine healing: Secondary | ICD-10-CM | POA: Diagnosis not present

## 2019-10-12 DIAGNOSIS — Z7901 Long term (current) use of anticoagulants: Secondary | ICD-10-CM | POA: Diagnosis not present

## 2019-10-12 DIAGNOSIS — H9190 Unspecified hearing loss, unspecified ear: Secondary | ICD-10-CM | POA: Diagnosis not present

## 2019-10-12 DIAGNOSIS — G894 Chronic pain syndrome: Secondary | ICD-10-CM | POA: Diagnosis not present

## 2019-10-12 DIAGNOSIS — M5136 Other intervertebral disc degeneration, lumbar region: Secondary | ICD-10-CM | POA: Diagnosis not present

## 2019-10-12 DIAGNOSIS — K219 Gastro-esophageal reflux disease without esophagitis: Secondary | ICD-10-CM | POA: Diagnosis not present

## 2019-10-12 DIAGNOSIS — R569 Unspecified convulsions: Secondary | ICD-10-CM | POA: Diagnosis not present

## 2019-10-12 DIAGNOSIS — G4700 Insomnia, unspecified: Secondary | ICD-10-CM | POA: Diagnosis not present

## 2019-10-12 DIAGNOSIS — J302 Other seasonal allergic rhinitis: Secondary | ICD-10-CM | POA: Diagnosis not present

## 2019-10-12 DIAGNOSIS — Z87891 Personal history of nicotine dependence: Secondary | ICD-10-CM | POA: Diagnosis not present

## 2019-10-12 DIAGNOSIS — R208 Other disturbances of skin sensation: Secondary | ICD-10-CM | POA: Diagnosis not present

## 2019-10-12 DIAGNOSIS — Z9181 History of falling: Secondary | ICD-10-CM | POA: Diagnosis not present

## 2019-10-12 DIAGNOSIS — M533 Sacrococcygeal disorders, not elsewhere classified: Secondary | ICD-10-CM | POA: Diagnosis not present

## 2019-10-12 DIAGNOSIS — G252 Other specified forms of tremor: Secondary | ICD-10-CM | POA: Diagnosis not present

## 2019-10-12 DIAGNOSIS — Z96641 Presence of right artificial hip joint: Secondary | ICD-10-CM | POA: Diagnosis not present

## 2019-10-12 DIAGNOSIS — M8580 Other specified disorders of bone density and structure, unspecified site: Secondary | ICD-10-CM | POA: Diagnosis not present

## 2019-10-12 DIAGNOSIS — Z87820 Personal history of traumatic brain injury: Secondary | ICD-10-CM | POA: Diagnosis not present

## 2019-10-14 ENCOUNTER — Ambulatory Visit (INDEPENDENT_AMBULATORY_CARE_PROVIDER_SITE_OTHER): Payer: Medicare Other | Admitting: Family Medicine

## 2019-10-14 ENCOUNTER — Encounter: Payer: Self-pay | Admitting: Family Medicine

## 2019-10-14 ENCOUNTER — Other Ambulatory Visit: Payer: Self-pay

## 2019-10-14 VITALS — BP 132/70 | HR 58 | Wt 150.0 lb

## 2019-10-14 DIAGNOSIS — Z79899 Other long term (current) drug therapy: Secondary | ICD-10-CM

## 2019-10-14 DIAGNOSIS — M5442 Lumbago with sciatica, left side: Secondary | ICD-10-CM

## 2019-10-14 DIAGNOSIS — G8929 Other chronic pain: Secondary | ICD-10-CM

## 2019-10-14 DIAGNOSIS — G9389 Other specified disorders of brain: Secondary | ICD-10-CM

## 2019-10-14 DIAGNOSIS — M5441 Lumbago with sciatica, right side: Secondary | ICD-10-CM

## 2019-10-14 DIAGNOSIS — R634 Abnormal weight loss: Secondary | ICD-10-CM | POA: Insufficient documentation

## 2019-10-14 DIAGNOSIS — G252 Other specified forms of tremor: Secondary | ICD-10-CM | POA: Diagnosis not present

## 2019-10-14 DIAGNOSIS — R4189 Other symptoms and signs involving cognitive functions and awareness: Secondary | ICD-10-CM

## 2019-10-14 NOTE — Progress Notes (Addendum)
Richard Glass is alone Sources of clinical information for visit is/are patient and past medical records. Nursing assessment for this office visit was reviewed with the patient for accuracy and revision.   Previous Report(s) Reviewed: ER records, historical medical records, lab reports, office notes, radiology reports and referral letter/letters  Depression screen [facility] 2/9 10/14/2019  Decreased Interest 0  Down, Depressed, Hopeless 0  PHQ - 2 Score 0  Altered sleeping -  Tired, decreased energy -  Change in appetite -  Feeling bad or failure about yourself  -  Trouble concentrating -  Moving slowly or fidgety/restless -  Suicidal thoughts -  PHQ-9 Score -  Some recent data might be hidden    Fall Risk  10/14/2019 09/03/2019 06/30/2019 06/25/2019 06/07/2019  Falls in the past year? 1 1 1 1  0  Comment - - - - -  Number falls in past yr: 1 1 0 0 0  Injury with Fall? 1 0 1 1 -  Comment - - - - -  Risk for fall due to : Impaired balance/gait;History of fall(s);Impaired mobility - History of fall(s) Other (Comment) -  Risk for fall due to: Comment - - - fell off a ladder -  Follow up - - - - Falls evaluation completed  Some encounter information is confidential and restricted. Go to Review Flowsheets activity to see all data.    Adult vaccines due  Topic Date Due  . TETANUS/TDAP  11/21/2027    Health Maintenance Due  Topic Date Due  . COLONOSCOPY  02/08/2019     History/P.E. limitations: Intellectual Disability, possible  Adult vaccines due  Topic Date Due  . TETANUS/TDAP  11/21/2027   There are no preventive care reminders to display for this patient.  Health Maintenance Due  Topic Date Due  . COLONOSCOPY  02/08/2019     Chief Complaint  Patient presents with  . Pain  . Weight Loss    Richard Glass drove himself to today office visit.  He prepares his own meals.  He denies running out of food.  He manages his own finances. He does his own laundry and housework.  Dressing  is challenging and effortful, especially his lower extremities bc of back and leg  pain with bending at waist.  He is able to bath, using a shower chair.    He did have a fall last week when attempting to get out of the bath.  After standing he fell back down into the shower chair and struck his upper back against the shower wall.  He feels like there is a bump there now. He says it hurts in that region.    Unintentional Weight Loss in Older Adults  Patient reports his weight has been stable by his home scales, around 145 lbs.  Diet Type: general; regular consistency; Able to cook for himself using stove and oven.  He is seated in kitchen when cooking.    Average meal intake, recent: eating a meal once a day.  Reports decreased appetite.   Medication effects :  Yes , opiates, alprazolam, phenobarbital, topiramate can reduce appetite  Level of physical activity: Sedentary;  independent  Polypharmacy (> 4 medications): Yes   Malignancy Hx: No , and no family hx of malignancy he recalls  Ongoing inflammatory or increased catabolic condition: No   Recent acute Illness: Right traumatic hip fracture 06/27/19 s/p ORIF pinning  Emotional problems, especially depression*: Denies, but lost his mother with whom he had lived for  his entire life. She died last year.   Alcoholism / Substance Abuse: No   Late-life paranoia: No   Swallowing disorders: No  Dysgeusia:No   Oral factors (e.g., poorly fitting dentures, caries): No   Food insecurity: No   Hyperthyroidism, hypothyroidism, hyperparathyroidism/hypercalcemia, hypoadrenalism:No known diagnoses of such   GI Disorders Hx* (Hiatal Hernia, Ischemic bowel, IBD, pancreatic insufficiency, PUD, GERD, celiac disease): No   Nausea and/or Vomiting: No  (Meds associated with nausea and vomiting: Abx, bisphosphonates, digoxin, dopamine agonists, metformin, SS/NRIs, Statins, TCAs)   GI/Biliary surgeries: No   Eating problems (e.g., inability to  feed self): Yes, difficulty feeding self bc of tremor  Dental or denture problems: No   Low-salt, low-cholesterol diet: No   Stones, social problems (e.g., isolation, inability to obtain preferred foods): Socially isolated since death of his mother last year  Cognitive impairment*: 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 Struck by golf ball that penetrated calvarium. Patient has Metal plate left skull.  Symptoms:  General Thirst: No  Fever: No  Night Sweats:No   Rigors: No  Fatigue: No    HEENT Headache: Yes, onset at bedtime, frontal, moderate, no associated nausea, not throbbing  Cardiovascular Abdominal pain with eating: No  Heart Failure Hx: No  Dyspnea on exertion: No   Respiratory Pulmonary Disease Hx: No  Dyspnea:  No  Cough: No  Gastrointestinal History of peptic ulcers:  No  Hisotry of GERD: Yes, on problem list but no indigestion symptoms.  Not on acid suppression  Indigestion/heartburn: No  Epigastric Pain: No  Hematemesis: No  Nausea and/or Vomiting: No  Melena: No    Diarrhea: No  Constipation: No  Genitourinary Dysuria: No  Urinary hesitancy: No  New urinary incontinence: No  Hematuria: No   Endocrine Polydipsia: No  Hematologic Anemia History: No  Swollen lymph nodes: No  Musculoskeletal Shoulder stiffness:  yes Muscle strength decrease: yes, generalized Joint Swelling/pain: No  Neuropsychologic Prolonged sadness: No  Loss of pleasure: No   Cancer Screening History Colorectal Cancer: No  Prostate Cancer: No  Lung Cancer: not at increased risk.  Less than 7 pack-year.  Quit in > 20 yrs ago     Physical  Exam  VS reviewed Wt Readings from Last 3 Encounters:  10/14/19 150 lb (68 kg)  09/03/19 140 lb (63.5 kg)  06/28/19 150 lb (68 kg)  Periodic grimacing/and making audible grunt when seated.  Attended to examiner throughtout exam, followed commands quickly and correctly. Cor: No M/G/R, palp DP pulses bilaterally Left EAC partial occlusion cerumen Pain reaction with movement of left leg or right leg down from leg supports of WC Non-rthymic jerking motion left leg with touching  Cerebellum: irregular jerking motion bilaterally of hands when touching his nose, but always reached target without past pointing. Similarly, bilateral simultaneous touching my fingers with his showed a jerking trajectory that did not miss the target on either side.  Seated there was no abnormal movements in trunk, face nor limbs - When asked to stand he develops a large amplitude nonrhythmic, jerking of trunk and arms, legs wobble and he falls back into WC where abnormal movements stop immediately. No reprt of feeling faint or presyncopal.  No asterixis, no resting tremor. No increase muscle tone of arms.  Strength in hands/arms/shoulders 4+/5 bilaterally  A/P  1) Weight loss - Weight appears to have returned at least to that of January 2020.  This is reassuring.  Differential Diagnosis of Richard Glass's weight loss Depression: 1/2 on PHQ2 Malignancy / Blood dyscrasia: Prostate, CRC Prescription medications Tremor with difficulty feeding? Social factors (isolation, poverty)  Visit Problem List with A/P  Kinetic tremor Established problem, worsened Abnormal Kinetic (intentional and postural) trunk and limb movements - bilateral abnormal movements tend to middle to large amplitudes and without rhythm - No abnormal resting movements - reportedly interfering with eating skill - If not of a solely functional origin, then contributions could be cerebellar or medication (topiramate, benzo, opiates, and  phenobarb) Abnormal motor movements may interfere with ability to self-feed per patient's report.  There has been some weight loss that could potentially be related to a difficulty with self-feeding Request both PT to assess his gait which was quite abnormal in office and OT to assess patient's ability to self-feed.   - May need referral back to Dr Jannifer Franklin to see if there is a cerebellar origin to these abnormal movements.  Stopping or reducing possible contributing medications listed above may help reduce these movements.      Weight loss, non-intentional New problem, uncertain origin and uncertain prognosis. Weight has returned to his January 2020 weight byour office scales.  Further workup with broad biophysical profile labs were unremarkable.  No further work-up planned at this time. Requested Chronic Care Management to contact patient about arranging a Meals on Wheels service.  Will monitor weight.  Patient does need screening colonoscopy based on age.  Will make referral once Richard Glass has been released by his orthopedists.   - Consider consultation with Nutritionists - Nutritional supplements recommended (provide two hours before or provide after meal) - Stop or reduce dose of possibly causal medications, if possible. This would include topiramate, phenobarb, opiates, and alprazolam - Meals in social setting, more leisurely environment simulating in-home dining.  ? Adult Daycare after pandemic/ - provide multivitamin  ======================================================================================================================================================================================================================================================================================================================================================================================================================================================

## 2019-10-14 NOTE — Patient Instructions (Signed)
We are checking for problems that can cause weight loss.  Dr Nimesh Riolo will review Dr Tobey Grim Neurology office visit notes to see what He thinks of your abnormal body movements.   We will arrange a Colonoscopy to screen for colon cancer once your hip condition is treated.   Dr October Peery will ask our case managers to call you to see if there are resources to help you in your home.   Dr Samarth Ogle wants to see you again in a month.

## 2019-10-15 ENCOUNTER — Ambulatory Visit (INDEPENDENT_AMBULATORY_CARE_PROVIDER_SITE_OTHER): Payer: Medicare Other | Admitting: Orthopedic Surgery

## 2019-10-15 DIAGNOSIS — Z8781 Personal history of (healed) traumatic fracture: Secondary | ICD-10-CM

## 2019-10-15 LAB — CBC WITH DIFFERENTIAL/PLATELET
Basophils Absolute: 0 10*3/uL (ref 0.0–0.2)
Basos: 0 %
EOS (ABSOLUTE): 0.1 10*3/uL (ref 0.0–0.4)
Eos: 2 %
Hematocrit: 38.3 % (ref 37.5–51.0)
Hemoglobin: 12.5 g/dL — ABNORMAL LOW (ref 13.0–17.7)
Immature Grans (Abs): 0 10*3/uL (ref 0.0–0.1)
Immature Granulocytes: 0 %
Lymphocytes Absolute: 2.4 10*3/uL (ref 0.7–3.1)
Lymphs: 37 %
MCH: 29.7 pg (ref 26.6–33.0)
MCHC: 32.6 g/dL (ref 31.5–35.7)
MCV: 91 fL (ref 79–97)
Monocytes Absolute: 0.5 10*3/uL (ref 0.1–0.9)
Monocytes: 7 %
Neutrophils Absolute: 3.5 10*3/uL (ref 1.4–7.0)
Neutrophils: 54 %
Platelets: 269 10*3/uL (ref 150–450)
RBC: 4.21 x10E6/uL (ref 4.14–5.80)
RDW: 13.2 % (ref 11.6–15.4)
WBC: 6.4 10*3/uL (ref 3.4–10.8)

## 2019-10-15 LAB — CMP14+EGFR
ALT: 7 IU/L (ref 0–44)
AST: 14 IU/L (ref 0–40)
Albumin/Globulin Ratio: 1.7 (ref 1.2–2.2)
Albumin: 4.4 g/dL (ref 4.0–5.0)
Alkaline Phosphatase: 91 IU/L (ref 39–117)
BUN/Creatinine Ratio: 8 — ABNORMAL LOW (ref 9–20)
BUN: 7 mg/dL (ref 6–24)
Bilirubin Total: <0.2 mg/dL (ref 0.0–1.2)
CO2: 26 mmol/L (ref 20–29)
Calcium: 9.3 mg/dL (ref 8.7–10.2)
Chloride: 100 mmol/L (ref 96–106)
Creatinine, Ser: 0.84 mg/dL (ref 0.76–1.27)
GFR calc Af Amer: 118 mL/min/{1.73_m2} (ref >59)
GFR calc non Af Amer: 102 mL/min/{1.73_m2} (ref >59)
Globulin, Total: 2.6 g/dL (ref 1.5–4.5)
Glucose: 71 mg/dL (ref 65–99)
Potassium: 3.7 mmol/L (ref 3.5–5.2)
Sodium: 140 mmol/L (ref 134–144)
Total Protein: 7 g/dL (ref 6.0–8.5)

## 2019-10-15 LAB — PHENOBARBITAL LEVEL: Phenobarbital, Serum: 39 ug/mL (ref 15–40)

## 2019-10-15 LAB — HIV ANTIBODY (ROUTINE TESTING W REFLEX): HIV Screen 4th Generation wRfx: NONREACTIVE

## 2019-10-15 LAB — SEDIMENTATION RATE: Sed Rate: 7 mm/hr (ref 0–30)

## 2019-10-15 LAB — LACTATE DEHYDROGENASE: LDH: 127 IU/L (ref 121–224)

## 2019-10-15 LAB — TSH: TSH: 2.13 u[IU]/mL (ref 0.450–4.500)

## 2019-10-15 LAB — PSA: Prostate Specific Ag, Serum: 0.5 ng/mL (ref 0.0–4.0)

## 2019-10-15 NOTE — Addendum Note (Signed)
Addended byWendy Poet, Jany Buckwalter D on: 10/15/2019 04:08 PM   Modules accepted: Orders

## 2019-10-15 NOTE — Assessment & Plan Note (Signed)
Established problem, worsened Abnormal motor movements interfering with ability to self-feed per patient's report.  There has been some weight loss that could potentially be related to a difficulty with self-feeding Request both PT to assess his gait which was quite abnormal in office and OT to assess patient's ability to self-feed.   Requested Chronic Care Management to contact patient about arranging a Meals on Wheels service.

## 2019-10-15 NOTE — Addendum Note (Signed)
Addended byWendy Poet, Yumna Ebers D on: 10/15/2019 03:14 PM   Modules accepted: Orders

## 2019-10-16 ENCOUNTER — Encounter: Payer: Self-pay | Admitting: Orthopedic Surgery

## 2019-10-16 NOTE — Progress Notes (Signed)
Office Visit Note   Patient: Richard Glass           Date of Birth: 30-Oct-1970           MRN: [id number] Visit Date: 10/15/2019 Requested by: [name], [name], MD [address] [place],  [place] [address] PCP: [name], [name], MD  Subjective: Chief Complaint  Patient presents with  . Follow-up    HPI: Jerame is a patient here for MRI lumbar spine reviewed.  Had right hip pinning but has been having some leg pain.  Hard for him to fully weight-bear on the leg.  Radiographs look fairly unremarkable.  There is been a little bit of collapse at the head but no obvious hardware failure.  MRI scan of the lumbar spine unremarkable for definitive reason for right-sided radiculopathy.              ROS: All systems reviewed are negative as they relate to the chief complaint within the history of present illness.  Patient denies  fevers or chills.   Assessment & Plan: Visit Diagnoses:  1. S/P right hip fracture     Plan: Impression is right hip pain and leg pain in a patient who had right hip pinning.  Radiographically the fracture looks like it may or may not be healing.  Overall the patient's medical condition has deteriorated mildly.  I would favor CT scanning right hip to evaluate healing of that fracture.  If it has not healed and that would explain his difficulty weightbearing.  Does not look like it is coming from the back.  May need dual mobility total hip replacement.  Follow-Up Instructions: No follow-ups on file.   Orders:  Orders Placed This Encounter  Procedures  . CT HIP RIGHT WO CONTRAST   No orders of the defined types were placed in this encounter.     Procedures: No procedures performed   Clinical Data: No additional findings.  Objective: Vital Signs: There were no vitals taken for this visit.  Physical Exam:   Constitutional: Patient appears well-developed HEENT:  Head: Normocephalic Eyes:EOM are normal Neck: Normal range of  motion Cardiovascular: Normal rate Pulmonary/chest: Effort normal Neurologic: Patient is alert Skin: Skin is warm Psychiatric: Patient has normal mood and affect    Ortho Exam: Ortho exam demonstrates pain in both legs with internal extra rotation of either leg.  Not much of a significant leg length discrepancy but the patient does have difficulty loadbearing on the right-hand side.  No knee effusion bilaterally.  Ankle dorsiflexion plantarflexion intact.  Specialty Comments:  No specialty comments available.  Imaging: No results found.   PMFS History: Patient Active Problem List   Diagnosis Date Noted  . Weight loss, non-intentional 10/14/2019  . Bilateral leg weakness 09/03/2019  . S/P right hip fracture 07/07/2019  . Right leg pain 06/26/2019  . Femoral fracture ([place]) 06/26/2019  . Hearing loss 05/01/2019  . Chronic left leg pain 11/20/2018  . Allodynia, left leg 11/20/2018  . Hamstring muscle strain, left, initial encounter 06/26/2018  . History of traumatic brain injury 02/23/2018  . High risk medication use, Combination of Opioid and Benzodiazapine 02/12/2018  . 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 SI (sacroiliac) pain 03/20/2017  . Enchondroma of left fibular head 05/17/2016  . Chronic pain syndrome 01/18/2016  . Anxiety state 08/08/2013  . Insomnia 02/02/2013  . Right flank pain 05/21/2011  . ASTHMA, INTERMITTENT 07/08/2010  . 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 Encounter for chronic pain management 07/04/2009  . Restless leg 09/29/2007  . Hyperlipidemia 11/13/2006  . Allergic rhinitis 11/13/2006  . GASTROESOPHAGEAL REFLUX, NO ESOPHAGITIS 11/13/2006  . Low back pain 11/13/2006  . Convulsions ([place]) 11/13/2006   Past Medical History:  Diagnosis Date  . 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  . Allergic rhinitis 11/13/2006       . Anxiety   .  Anxiety state 08/08/2013  . Asthma   . ASTHMA, INTERMITTENT 07/08/2010   Qualifier: Diagnosis of  By: Walker Kehr MD, Patrick Jupiter    . Back pain of lumbar region with sciatica 11/13/2006   MRI 12/2014 - lumbar DDD without focal neural impingement  MRI Lumbar 10/30/16 ([place]) Small central L5-S1 disc extrusion with minimal cranial migration and associated annular fissure approaches descending left S1 nerve roots without contact or displacement. Minimal bulging disc height loss without spinal canal stenosis or neural foraminal narrowing.  Minimal bulging disc at L2-L3 through L4-L5 without spinal canal stenosis or neural foraminal narrowing.  MRI Sacrum 10/30/16 ([place]) No fracture. Small lesion left sacral ala most likely a subchondral cyst/erosion adjacent to the sacroiliac joint.     . 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 Carpal tunnel syndrome 01/09/2015   Left 12/2014   . Cervical spine pain 02/06/2015   MRI 03/2015 FINDINGS: Vertebral body height, signal and alignment are normal. The craniocervical junction is normal and cervical cord signal is normal. The central spinal canal and neural foramina are widely patent at all levels. Scattered, mild degenerative change appears most notable at C4-5. Imaged paraspinous structures are unremarkable.  IMPRESSION: Negative for central canal or foraminal narrowing. No finding to explain the patient's symptoms. Scattered, mild facet degenerative disease is noted.   . Chronic low back pain   . Chronic pain 01/18/2016  . Chronic pain syndrome 01/18/2016  . Coccyx pain 02/06/2015  . Contact dermatitis or eczema 02/13/2018  . 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   . Degenerative arthritis   . Dyslipidemia   . External hemorrhoid, bleeding 06/04/2011  . GASTROESOPHAGEAL REFLUX, NO ESOPHAGITIS 11/13/2006   Qualifier: Diagnosis of  By: Eusebio Friendly    . Hamstring muscle strain, left, initial encounter 06/26/2018  . Headache(784.0)   . Hyperlipidemia 11/13/2006   07/2016  ASCVD score of 4.7% - recommended lifestyle changes   . Insomnia 02/02/2013  . Intention tremor 10/31/2009   Qualifier: Diagnosis of  By: Walker Kehr MD, Patrick Jupiter    . Intercostal pain 04/25/2015  . Left foot pain 08/21/2018  . Left knee injury 05/09/2016  . 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 Morton's neuroma of left foot 01/18/2016  . Osteopenia 10/10/2016  . Overweight(278.02) 06/13/2009   Qualifier: Diagnosis of  By: Hedy Camara    . Pain in joint, shoulder region 07/05/2014   LEFT > Right Reports hx rotator cuff surgery on rigt shoulder previously (Dr Nicholes Stairs) but no notes available XRAY right shoulder showed some proliferative changes distal rigt clavicle   . Rash and nonspecific skin eruption 10/10/2016  . Restless leg 09/29/2007   Qualifier: Diagnosis of  By: Walker Kehr MD, Patrick Jupiter    . Restless legs syndrome (RLS)   . Sciatica of left side 10/26/2014  . Seizures ([place])    last >64yrs  . SI (sacroiliac) pain 03/20/2017  . Sinusitis  chronic, frontal   . Status post lumbar spinal fusion 03/20/2017  . Subacromial or subdeltoid bursitis 08/08/2009   MRI C-spine 2011([facility]) - normal MRI left shoulder 10/2010 ([facility]) - AC degenerative disease, normal rotator cuff 12/2012 -debridement, acromioplasty and distal clavicle excision of left shoulder, arthroscopy also performed an no need for rotator cuff repair - performed by [facility] 02/2013 - Nerve Conduction Studies ([facility]) - ulnar nerve compression 02/2013- taken back to OR for Ulnar nerve decompression 06/2013 Repeat MRI left shoulder - subacromial/subdeltoid bursitis, a small intersubstance tear to the distal infraspinatus and evidence of interval resection of the distal clavicle 06/2013 - steroid injection of left biceps 09/2013 -Repeat EMG showed improvement of ulnar nerve compression 10/2013 - referred to  [facility] for second opinion due to intractable pain 11/2013 - Seen by Dr[facility] Marlou Sa at [facility]; discussed risks/benefits of surgical intervention and bursectomy, no plan for surgery at this time     . 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 Cancer Father        lung  . Heart disease Father   . Hypertension Mother   . Seizures Neg Hx     Past Surgical History:  Procedure Laterality Date  . [place]   struck by golf ball-49 yrs old  . CRANIOTOMY     metal plate  . HIP PINNING,CANNULATED Right 06/27/2019   Procedure: HIP PERCUTANEOUS PINNING;  Surgeon: Meredith Pel, MD;  Location: [facility];  Service: Orthopedics;  Laterality: Right;  . NASAL FRACTURE SURGERY  1997  . SACROILIAC JOINT FUSION Left 03/20/2017   Procedure: SACROILIAC JOINT FUSION;  Surgeon: Melina Schools, MD;  Location: [facility];  Service: Orthopedics;  Laterality: Left;  90 mins  . SHOULDER ARTHROSCOPY Left    "cleaned arthritis out"  . SHOULDER SURGERY Right 2012   Rotator cuff surgery  . TONSILLECTOMY    . UVULOPALATOPHARYNGOPLASTY (UPPP)/TONSILLECTOMY/SEPTOPLASTY  2001   Social History   Occupational History  . Occupation: disabled  Tobacco Use  . Smoking status: Former Smoker    Packs/day: 1.00    Years: 22.00    Pack years: 22.00    Types: Cigarettes    Quit date: 06/22/2012    Years since quitting: 7.3  . Smokeless tobacco: Current User    Types: Snuff  Substance and Sexual Activity  . Alcohol use: No    Alcohol/week: 0.0 standard drinks    Comment: none since 7/11  . Drug use: No  . Sexual activity: Never

## 2019-10-18 NOTE — Assessment & Plan Note (Signed)
New problem, uncertain origin and uncertain prognosis. Weight has returned to his January 2020 weight byour office scales.  Further workup with broad biophysical profile labs were unremarkable.  No further work-up planned at this time. Will monitor weight. Patient does need screening colonoscopy based on age.  Will make referral once Richard Glass has been released by his orthopedists.

## 2019-10-19 DIAGNOSIS — M5136 Other intervertebral disc degeneration, lumbar region: Secondary | ICD-10-CM | POA: Diagnosis not present

## 2019-10-19 DIAGNOSIS — K219 Gastro-esophageal reflux disease without esophagitis: Secondary | ICD-10-CM | POA: Diagnosis not present

## 2019-10-19 DIAGNOSIS — R208 Other disturbances of skin sensation: Secondary | ICD-10-CM | POA: Diagnosis not present

## 2019-10-19 DIAGNOSIS — J302 Other seasonal allergic rhinitis: Secondary | ICD-10-CM | POA: Diagnosis not present

## 2019-10-19 DIAGNOSIS — G4700 Insomnia, unspecified: Secondary | ICD-10-CM | POA: Diagnosis not present

## 2019-10-19 DIAGNOSIS — M8580 Other specified disorders of bone density and structure, unspecified site: Secondary | ICD-10-CM | POA: Diagnosis not present

## 2019-10-19 DIAGNOSIS — G894 Chronic pain syndrome: Secondary | ICD-10-CM | POA: Diagnosis not present

## 2019-10-19 DIAGNOSIS — G252 Other specified forms of tremor: Secondary | ICD-10-CM | POA: Diagnosis not present

## 2019-10-19 DIAGNOSIS — J4520 Mild intermittent asthma, uncomplicated: Secondary | ICD-10-CM | POA: Diagnosis not present

## 2019-10-19 DIAGNOSIS — G2581 Restless legs syndrome: Secondary | ICD-10-CM | POA: Diagnosis not present

## 2019-10-19 DIAGNOSIS — S72011D Unspecified intracapsular fracture of right femur, subsequent encounter for closed fracture with routine healing: Secondary | ICD-10-CM | POA: Diagnosis not present

## 2019-10-19 DIAGNOSIS — M1990 Unspecified osteoarthritis, unspecified site: Secondary | ICD-10-CM | POA: Diagnosis not present

## 2019-10-19 DIAGNOSIS — E785 Hyperlipidemia, unspecified: Secondary | ICD-10-CM | POA: Diagnosis not present

## 2019-10-19 DIAGNOSIS — Z87891 Personal history of nicotine dependence: Secondary | ICD-10-CM | POA: Diagnosis not present

## 2019-10-19 DIAGNOSIS — Z9181 History of falling: Secondary | ICD-10-CM | POA: Diagnosis not present

## 2019-10-19 DIAGNOSIS — R569 Unspecified convulsions: Secondary | ICD-10-CM | POA: Diagnosis not present

## 2019-10-19 DIAGNOSIS — M533 Sacrococcygeal disorders, not elsewhere classified: Secondary | ICD-10-CM | POA: Diagnosis not present

## 2019-10-19 DIAGNOSIS — Z87820 Personal history of traumatic brain injury: Secondary | ICD-10-CM | POA: Diagnosis not present

## 2019-10-19 DIAGNOSIS — H9190 Unspecified hearing loss, unspecified ear: Secondary | ICD-10-CM | POA: Diagnosis not present

## 2019-10-19 DIAGNOSIS — Z96641 Presence of right artificial hip joint: Secondary | ICD-10-CM | POA: Diagnosis not present

## 2019-10-19 DIAGNOSIS — I10 Essential (primary) hypertension: Secondary | ICD-10-CM | POA: Diagnosis not present

## 2019-10-19 DIAGNOSIS — Z7901 Long term (current) use of anticoagulants: Secondary | ICD-10-CM | POA: Diagnosis not present

## 2019-10-21 ENCOUNTER — Telehealth: Payer: Self-pay | Admitting: *Deleted

## 2019-10-21 ENCOUNTER — Other Ambulatory Visit: Payer: Self-pay | Admitting: Neurology

## 2019-10-21 NOTE — Telephone Encounter (Signed)
Patient thought that Dr. Wendy Poet was going to give him medication for his leg pain after his appt.  Will forward to PCP.  Christen Bame, CMA

## 2019-10-21 NOTE — Progress Notes (Signed)
   CHIEF COMPLAINT / HPI:   Cerumen impaction He presented to clinic today because he had the sensation that earwax was clogging his right ear.  He was hoping that he could have his right ear washed out for the earwax removed somehow.  Chronic lower extremity pain He reported continued lower extremity pain primarily in the right side.  He reported that he had discussed this recently with Dr. Vikki Ports and was told that a medication would be prescribed but that nothing was sent to the pharmacy.  He wants to know if medication can be prescribed now.  PERTINENT  PMH / PSH: Previous episodes of cerumen impaction   OBJECTIVE: BP 122/68   Pulse 61   SpO2 96%    HEENT: Left tympanic membrane visible and normal.  Right tympanic membrane obscured by cerumen.  This was assessed following ear lavage at which time the right tympanic membrane was visible and normal-appearing.  ASSESSMENT / PLAN:  Cerumen impaction Right tympanic membrane visible following ear lavage.  Hearing screen performed in clinic today in past.  He reported that he could still feel some ear wax in his ear though his hearing was normal. -Debrox otic drops prescribed for 1 week  Right leg pain Discussed with his PCP, Dr. [name], and prescribed amitriptyline 10 mg nightly.     Matilde Haymaker, MD [place]

## 2019-10-21 NOTE — Telephone Encounter (Signed)
Please let Mr Glotfelty know he may refill his #100 tablets of Hydrocodone/acetaminophen on 10/31/19.

## 2019-10-22 ENCOUNTER — Ambulatory Visit (INDEPENDENT_AMBULATORY_CARE_PROVIDER_SITE_OTHER): Payer: Medicare Other | Admitting: Family Medicine

## 2019-10-22 ENCOUNTER — Encounter: Payer: Self-pay | Admitting: Family Medicine

## 2019-10-22 ENCOUNTER — Other Ambulatory Visit: Payer: Self-pay

## 2019-10-22 DIAGNOSIS — M79604 Pain in right leg: Secondary | ICD-10-CM

## 2019-10-22 DIAGNOSIS — H6121 Impacted cerumen, right ear: Secondary | ICD-10-CM | POA: Diagnosis not present

## 2019-10-22 MED ORDER — AMITRIPTYLINE HCL 10 MG PO TABS: 10.0000 mg | ORAL_TABLET | Freq: Every day | ORAL | 0 refills | Status: DC

## 2019-10-22 MED ORDER — DEBROX 6.5 % OT SOLN: 5.0000 [drp] | Freq: Two times a day (BID) | OTIC | 0 refills | Status: DC

## 2019-10-22 NOTE — Telephone Encounter (Signed)
Patient informed while in clinic.  Ricco Dershem,CMA

## 2019-10-22 NOTE — Assessment & Plan Note (Signed)
Right tympanic membrane visible following ear lavage.  Hearing screen performed in clinic today in past.  He reported that he could still feel some ear wax in his ear though his hearing was normal. -Debrox otic drops prescribed for 1 week

## 2019-10-22 NOTE — Assessment & Plan Note (Signed)
Discussed with his PCP, Dr. [name], and prescribed amitriptyline 10 mg nightly.

## 2019-10-22 NOTE — Patient Instructions (Addendum)
Earwax: I prescribed the Debrox.  This medication will help dissolve the earwax in your ears.  I suggest putting in several drops and then lying on your side for 10 minutes to allow the drops to sit in the ear.  She has for the next 7 days.  Leg pain: I discussed her leg pain with Dr. [name] and he suggested starting you on any medication called amitriptyline.  Start taking this medication at night and it may be helpful for your leg pain.

## 2019-10-26 ENCOUNTER — Telehealth: Payer: Self-pay

## 2019-10-26 ENCOUNTER — Telehealth: Payer: Self-pay | Admitting: Orthopedic Surgery

## 2019-10-26 DIAGNOSIS — M8580 Other specified disorders of bone density and structure, unspecified site: Secondary | ICD-10-CM | POA: Diagnosis not present

## 2019-10-26 DIAGNOSIS — M533 Sacrococcygeal disorders, not elsewhere classified: Secondary | ICD-10-CM | POA: Diagnosis not present

## 2019-10-26 DIAGNOSIS — Z96641 Presence of right artificial hip joint: Secondary | ICD-10-CM | POA: Diagnosis not present

## 2019-10-26 DIAGNOSIS — R569 Unspecified convulsions: Secondary | ICD-10-CM | POA: Diagnosis not present

## 2019-10-26 DIAGNOSIS — K219 Gastro-esophageal reflux disease without esophagitis: Secondary | ICD-10-CM | POA: Diagnosis not present

## 2019-10-26 DIAGNOSIS — J302 Other seasonal allergic rhinitis: Secondary | ICD-10-CM | POA: Diagnosis not present

## 2019-10-26 DIAGNOSIS — G894 Chronic pain syndrome: Secondary | ICD-10-CM | POA: Diagnosis not present

## 2019-10-26 DIAGNOSIS — H9190 Unspecified hearing loss, unspecified ear: Secondary | ICD-10-CM | POA: Diagnosis not present

## 2019-10-26 DIAGNOSIS — Z7901 Long term (current) use of anticoagulants: Secondary | ICD-10-CM | POA: Diagnosis not present

## 2019-10-26 DIAGNOSIS — G2581 Restless legs syndrome: Secondary | ICD-10-CM | POA: Diagnosis not present

## 2019-10-26 DIAGNOSIS — G4700 Insomnia, unspecified: Secondary | ICD-10-CM | POA: Diagnosis not present

## 2019-10-26 DIAGNOSIS — G252 Other specified forms of tremor: Secondary | ICD-10-CM | POA: Diagnosis not present

## 2019-10-26 DIAGNOSIS — J4520 Mild intermittent asthma, uncomplicated: Secondary | ICD-10-CM | POA: Diagnosis not present

## 2019-10-26 DIAGNOSIS — M5136 Other intervertebral disc degeneration, lumbar region: Secondary | ICD-10-CM | POA: Diagnosis not present

## 2019-10-26 DIAGNOSIS — Z87891 Personal history of nicotine dependence: Secondary | ICD-10-CM | POA: Diagnosis not present

## 2019-10-26 DIAGNOSIS — E785 Hyperlipidemia, unspecified: Secondary | ICD-10-CM | POA: Diagnosis not present

## 2019-10-26 DIAGNOSIS — S72011D Unspecified intracapsular fracture of right femur, subsequent encounter for closed fracture with routine healing: Secondary | ICD-10-CM | POA: Diagnosis not present

## 2019-10-26 DIAGNOSIS — I10 Essential (primary) hypertension: Secondary | ICD-10-CM | POA: Diagnosis not present

## 2019-10-26 DIAGNOSIS — M1990 Unspecified osteoarthritis, unspecified site: Secondary | ICD-10-CM | POA: Diagnosis not present

## 2019-10-26 DIAGNOSIS — R208 Other disturbances of skin sensation: Secondary | ICD-10-CM | POA: Diagnosis not present

## 2019-10-26 DIAGNOSIS — Z87820 Personal history of traumatic brain injury: Secondary | ICD-10-CM | POA: Diagnosis not present

## 2019-10-26 DIAGNOSIS — Z9181 History of falling: Secondary | ICD-10-CM | POA: Diagnosis not present

## 2019-10-26 NOTE — Telephone Encounter (Signed)
Patient calling in reference to hip (possible having surgery).  He was under the impression we are scheduling him for a CT scan.  He states he's heard nothing from our office. He can best be reached at [phone].

## 2019-10-26 NOTE — Telephone Encounter (Signed)
10/26/2019 Left message on voicemail  to return my call at [facility] regarding their [place] with patient and explained that he had to be at least 50 for the meals on wheels program.  Asked if he was ok with providing personal information for me to complete the the [facility] Delivery program referral form. Will call patient back once I have spoken to a coordinator at [facility], also informed the patient of this. Ambrose Mantle [phone]

## 2019-10-27 ENCOUNTER — Telehealth: Payer: Self-pay

## 2019-10-27 NOTE — Telephone Encounter (Signed)
IC pt and informed him his order was sent to [facility] and they shoulld be calling him to schedule appt, I also gave him number to imaging to call and schedule him self if he chooses.

## 2019-10-27 NOTE — Telephone Encounter (Signed)
10/27/2019 Received email confirmation from Hart Carwin that [name] is signed up to receive grocery delivery to his home.  [name] will email the start date to me.  Spoke with [name] to let hime know he is signed up for the program and will receive a call from me when I receive a start date for delivery. Ambrose Mantle [phone]

## 2019-10-27 NOTE — Telephone Encounter (Signed)
He has he needs CT scan of the right hip to evaluate for nonunion and then follow-up with Dr. Sherrian Divers after that thanks

## 2019-10-27 NOTE — Telephone Encounter (Signed)
10/27/2019 Spoke with Hart Carwin, Outreach Services Coordinator about referral for the [employer].  As requested by [place] I emailed the referral form to her.  She will contact me by end of the day tomorrow.  Once I hear from her I will update the patient.  Ambrose Mantle [phone]

## 2019-10-27 NOTE — Telephone Encounter (Signed)
Can you please check on this when you get a chance? Patient is to follow up with Dr Erlinda Hong after the scan.

## 2019-10-29 ENCOUNTER — Other Ambulatory Visit: Payer: Self-pay

## 2019-10-29 DIAGNOSIS — G8929 Other chronic pain: Secondary | ICD-10-CM

## 2019-10-29 DIAGNOSIS — M533 Sacrococcygeal disorders, not elsewhere classified: Secondary | ICD-10-CM

## 2019-10-29 DIAGNOSIS — M79605 Pain in left leg: Secondary | ICD-10-CM

## 2019-10-29 DIAGNOSIS — G894 Chronic pain syndrome: Secondary | ICD-10-CM

## 2019-10-29 MED ORDER — HYDROCODONE-ACETAMINOPHEN 7.5-325 MG PO TABS: 1.0000 | ORAL_TABLET | Freq: Four times a day (QID) | ORAL | 0 refills | Status: DC | PRN

## 2019-11-02 ENCOUNTER — Telehealth: Payer: Self-pay

## 2019-11-02 DIAGNOSIS — M8580 Other specified disorders of bone density and structure, unspecified site: Secondary | ICD-10-CM | POA: Diagnosis not present

## 2019-11-02 DIAGNOSIS — Z96641 Presence of right artificial hip joint: Secondary | ICD-10-CM | POA: Diagnosis not present

## 2019-11-02 DIAGNOSIS — G2581 Restless legs syndrome: Secondary | ICD-10-CM | POA: Diagnosis not present

## 2019-11-02 DIAGNOSIS — R208 Other disturbances of skin sensation: Secondary | ICD-10-CM | POA: Diagnosis not present

## 2019-11-02 DIAGNOSIS — G252 Other specified forms of tremor: Secondary | ICD-10-CM | POA: Diagnosis not present

## 2019-11-02 DIAGNOSIS — M1990 Unspecified osteoarthritis, unspecified site: Secondary | ICD-10-CM | POA: Diagnosis not present

## 2019-11-02 DIAGNOSIS — Z87820 Personal history of traumatic brain injury: Secondary | ICD-10-CM | POA: Diagnosis not present

## 2019-11-02 DIAGNOSIS — Z9181 History of falling: Secondary | ICD-10-CM | POA: Diagnosis not present

## 2019-11-02 DIAGNOSIS — Z7901 Long term (current) use of anticoagulants: Secondary | ICD-10-CM | POA: Diagnosis not present

## 2019-11-02 DIAGNOSIS — G894 Chronic pain syndrome: Secondary | ICD-10-CM | POA: Diagnosis not present

## 2019-11-02 DIAGNOSIS — S72011D Unspecified intracapsular fracture of right femur, subsequent encounter for closed fracture with routine healing: Secondary | ICD-10-CM | POA: Diagnosis not present

## 2019-11-02 DIAGNOSIS — G4700 Insomnia, unspecified: Secondary | ICD-10-CM | POA: Diagnosis not present

## 2019-11-02 DIAGNOSIS — R569 Unspecified convulsions: Secondary | ICD-10-CM | POA: Diagnosis not present

## 2019-11-02 DIAGNOSIS — K219 Gastro-esophageal reflux disease without esophagitis: Secondary | ICD-10-CM | POA: Diagnosis not present

## 2019-11-02 DIAGNOSIS — M533 Sacrococcygeal disorders, not elsewhere classified: Secondary | ICD-10-CM | POA: Diagnosis not present

## 2019-11-02 DIAGNOSIS — I10 Essential (primary) hypertension: Secondary | ICD-10-CM | POA: Diagnosis not present

## 2019-11-02 DIAGNOSIS — J302 Other seasonal allergic rhinitis: Secondary | ICD-10-CM | POA: Diagnosis not present

## 2019-11-02 DIAGNOSIS — E785 Hyperlipidemia, unspecified: Secondary | ICD-10-CM | POA: Diagnosis not present

## 2019-11-02 DIAGNOSIS — Z87891 Personal history of nicotine dependence: Secondary | ICD-10-CM | POA: Diagnosis not present

## 2019-11-02 DIAGNOSIS — J4520 Mild intermittent asthma, uncomplicated: Secondary | ICD-10-CM | POA: Diagnosis not present

## 2019-11-02 DIAGNOSIS — M5136 Other intervertebral disc degeneration, lumbar region: Secondary | ICD-10-CM | POA: Diagnosis not present

## 2019-11-02 DIAGNOSIS — H9190 Unspecified hearing loss, unspecified ear: Secondary | ICD-10-CM | POA: Diagnosis not present

## 2019-11-02 NOTE — Telephone Encounter (Signed)
11/02/2019 Spoke with food deliverer for [place] food was delivered to patient today. Spoke with patient he received his food delivery and has no other needs at this time.  Richard Glass [phone]

## 2019-11-04 DIAGNOSIS — Z7901 Long term (current) use of anticoagulants: Secondary | ICD-10-CM | POA: Diagnosis not present

## 2019-11-04 DIAGNOSIS — R208 Other disturbances of skin sensation: Secondary | ICD-10-CM | POA: Diagnosis not present

## 2019-11-04 DIAGNOSIS — J302 Other seasonal allergic rhinitis: Secondary | ICD-10-CM | POA: Diagnosis not present

## 2019-11-04 DIAGNOSIS — Z9181 History of falling: Secondary | ICD-10-CM | POA: Diagnosis not present

## 2019-11-04 DIAGNOSIS — M5136 Other intervertebral disc degeneration, lumbar region: Secondary | ICD-10-CM | POA: Diagnosis not present

## 2019-11-04 DIAGNOSIS — G894 Chronic pain syndrome: Secondary | ICD-10-CM | POA: Diagnosis not present

## 2019-11-04 DIAGNOSIS — Z87891 Personal history of nicotine dependence: Secondary | ICD-10-CM | POA: Diagnosis not present

## 2019-11-04 DIAGNOSIS — H9190 Unspecified hearing loss, unspecified ear: Secondary | ICD-10-CM | POA: Diagnosis not present

## 2019-11-04 DIAGNOSIS — K219 Gastro-esophageal reflux disease without esophagitis: Secondary | ICD-10-CM | POA: Diagnosis not present

## 2019-11-04 DIAGNOSIS — Z96641 Presence of right artificial hip joint: Secondary | ICD-10-CM | POA: Diagnosis not present

## 2019-11-04 DIAGNOSIS — G2581 Restless legs syndrome: Secondary | ICD-10-CM | POA: Diagnosis not present

## 2019-11-04 DIAGNOSIS — G252 Other specified forms of tremor: Secondary | ICD-10-CM | POA: Diagnosis not present

## 2019-11-04 DIAGNOSIS — I10 Essential (primary) hypertension: Secondary | ICD-10-CM | POA: Diagnosis not present

## 2019-11-04 DIAGNOSIS — G4700 Insomnia, unspecified: Secondary | ICD-10-CM | POA: Diagnosis not present

## 2019-11-04 DIAGNOSIS — R569 Unspecified convulsions: Secondary | ICD-10-CM | POA: Diagnosis not present

## 2019-11-04 DIAGNOSIS — M1990 Unspecified osteoarthritis, unspecified site: Secondary | ICD-10-CM | POA: Diagnosis not present

## 2019-11-04 DIAGNOSIS — J4520 Mild intermittent asthma, uncomplicated: Secondary | ICD-10-CM | POA: Diagnosis not present

## 2019-11-04 DIAGNOSIS — S72011D Unspecified intracapsular fracture of right femur, subsequent encounter for closed fracture with routine healing: Secondary | ICD-10-CM | POA: Diagnosis not present

## 2019-11-04 DIAGNOSIS — M8580 Other specified disorders of bone density and structure, unspecified site: Secondary | ICD-10-CM | POA: Diagnosis not present

## 2019-11-04 DIAGNOSIS — E785 Hyperlipidemia, unspecified: Secondary | ICD-10-CM | POA: Diagnosis not present

## 2019-11-04 DIAGNOSIS — M533 Sacrococcygeal disorders, not elsewhere classified: Secondary | ICD-10-CM | POA: Diagnosis not present

## 2019-11-04 DIAGNOSIS — Z87820 Personal history of traumatic brain injury: Secondary | ICD-10-CM | POA: Diagnosis not present

## 2019-11-05 ENCOUNTER — Ambulatory Visit
Admission: RE | Admit: 2019-11-05 | Discharge: 2019-11-05 | Disposition: A | Discharge status: Home / Self Care | Payer: Medicare Other | Source: Ambulatory Visit | Attending: Orthopedic Surgery | Admitting: Orthopedic Surgery

## 2019-11-05 ENCOUNTER — Other Ambulatory Visit: Payer: Self-pay

## 2019-11-05 DIAGNOSIS — Z8781 Personal history of (healed) traumatic fracture: Secondary | ICD-10-CM

## 2019-11-05 DIAGNOSIS — S72011D Unspecified intracapsular fracture of right femur, subsequent encounter for closed fracture with routine healing: Secondary | ICD-10-CM | POA: Diagnosis not present

## 2019-11-08 ENCOUNTER — Other Ambulatory Visit: Payer: Self-pay

## 2019-11-08 ENCOUNTER — Ambulatory Visit (INDEPENDENT_AMBULATORY_CARE_PROVIDER_SITE_OTHER): Payer: Medicare Other | Admitting: Orthopedic Surgery

## 2019-11-08 DIAGNOSIS — Z8781 Personal history of (healed) traumatic fracture: Secondary | ICD-10-CM | POA: Diagnosis not present

## 2019-11-08 DIAGNOSIS — M5410 Radiculopathy, site unspecified: Secondary | ICD-10-CM

## 2019-11-09 ENCOUNTER — Encounter: Payer: Self-pay | Admitting: Orthopedic Surgery

## 2019-11-09 DIAGNOSIS — Z9181 History of falling: Secondary | ICD-10-CM | POA: Diagnosis not present

## 2019-11-09 DIAGNOSIS — J302 Other seasonal allergic rhinitis: Secondary | ICD-10-CM | POA: Diagnosis not present

## 2019-11-09 DIAGNOSIS — M533 Sacrococcygeal disorders, not elsewhere classified: Secondary | ICD-10-CM | POA: Diagnosis not present

## 2019-11-09 DIAGNOSIS — H9190 Unspecified hearing loss, unspecified ear: Secondary | ICD-10-CM | POA: Diagnosis not present

## 2019-11-09 DIAGNOSIS — M8580 Other specified disorders of bone density and structure, unspecified site: Secondary | ICD-10-CM | POA: Diagnosis not present

## 2019-11-09 DIAGNOSIS — K219 Gastro-esophageal reflux disease without esophagitis: Secondary | ICD-10-CM | POA: Diagnosis not present

## 2019-11-09 DIAGNOSIS — R569 Unspecified convulsions: Secondary | ICD-10-CM | POA: Diagnosis not present

## 2019-11-09 DIAGNOSIS — I10 Essential (primary) hypertension: Secondary | ICD-10-CM | POA: Diagnosis not present

## 2019-11-09 DIAGNOSIS — J4520 Mild intermittent asthma, uncomplicated: Secondary | ICD-10-CM | POA: Diagnosis not present

## 2019-11-09 DIAGNOSIS — G4700 Insomnia, unspecified: Secondary | ICD-10-CM | POA: Diagnosis not present

## 2019-11-09 DIAGNOSIS — E785 Hyperlipidemia, unspecified: Secondary | ICD-10-CM | POA: Diagnosis not present

## 2019-11-09 DIAGNOSIS — M5136 Other intervertebral disc degeneration, lumbar region: Secondary | ICD-10-CM | POA: Diagnosis not present

## 2019-11-09 DIAGNOSIS — G894 Chronic pain syndrome: Secondary | ICD-10-CM | POA: Diagnosis not present

## 2019-11-09 DIAGNOSIS — M1990 Unspecified osteoarthritis, unspecified site: Secondary | ICD-10-CM | POA: Diagnosis not present

## 2019-11-09 DIAGNOSIS — Z87820 Personal history of traumatic brain injury: Secondary | ICD-10-CM | POA: Diagnosis not present

## 2019-11-09 DIAGNOSIS — R208 Other disturbances of skin sensation: Secondary | ICD-10-CM | POA: Diagnosis not present

## 2019-11-09 DIAGNOSIS — Z96641 Presence of right artificial hip joint: Secondary | ICD-10-CM | POA: Diagnosis not present

## 2019-11-09 DIAGNOSIS — Z87891 Personal history of nicotine dependence: Secondary | ICD-10-CM | POA: Diagnosis not present

## 2019-11-09 DIAGNOSIS — G252 Other specified forms of tremor: Secondary | ICD-10-CM | POA: Diagnosis not present

## 2019-11-09 DIAGNOSIS — S72011D Unspecified intracapsular fracture of right femur, subsequent encounter for closed fracture with routine healing: Secondary | ICD-10-CM | POA: Diagnosis not present

## 2019-11-09 DIAGNOSIS — G2581 Restless legs syndrome: Secondary | ICD-10-CM | POA: Diagnosis not present

## 2019-11-09 DIAGNOSIS — Z7901 Long term (current) use of anticoagulants: Secondary | ICD-10-CM | POA: Diagnosis not present

## 2019-11-09 NOTE — Progress Notes (Signed)
Office Visit Note   Patient: Richard Glass           Date of Birth: [birth year]           MRN: [id number] Visit Date: 11/08/2019 Requested by: [name], [name], Glass [address] [place],  [place] [address] PCP: [name], [name], Glass  Subjective: Chief Complaint  Patient presents with  . Follow-up    HPI: Richard Glass is a patient is now many months status post right hip fracture fixation.  He is having groin pain and difficulty ambulating.  Since I seen him he had a CT scan which shows only partial healing of the fracture but no change in hardware position.  Areas of the fracture lines are still visible.  He is having fairly debilitating symptoms involving the right proximal leg.              ROS: All systems reviewed are negative as they relate to the chief complaint within the history of present illness.  Patient denies  fevers or chills.   Assessment & Plan: Visit Diagnoses:  1. Radicular leg pain   2. S/P right hip fracture     Plan: Impression is right hip pain which really looks to be coming from the hip itself.  The back MRI scan is fairly unremarkable.  I think he may benefit from total hip replacement.  I like him to see one of my partners for consideration of this intervention.  I will see him back as needed.  Follow-Up Instructions: No follow-ups on file.   Orders:  No orders of the defined types were placed in this encounter.  No orders of the defined types were placed in this encounter.     Procedures: No procedures performed   Clinical Data: No additional findings.  Objective: Vital Signs: There were no vitals taken for this visit.  Physical Exam:   Constitutional: Patient appears well-developed HEENT:  Head: Normocephalic Eyes:EOM are normal Neck: Normal range of motion Cardiovascular: Normal rate Pulmonary/chest: Effort normal Neurologic: Patient is alert Skin: Skin is warm Psychiatric: Patient has normal mood and affect    Ortho  Exam: Ortho exam demonstrates antalgic gait to the right.  Does have groin pain with internal X rotation of the leg.  Also has some mild knee pain with range of motion and extensor mechanism is intact in the knee region.  Collaterals are stable.  Does seem to have some arthritis in the knee region.  He is able to weight-bear on the leg but he uses assistive devices.  Left hip has no real pain with range of motion.  No nerve root tension signs bilaterally.  Specialty Comments:  No specialty comments available.  Imaging: No results found.   PMFS History: Patient Active Problem List   Diagnosis Date Noted  . Weight loss, non-intentional 10/14/2019  . Bilateral leg weakness 09/03/2019  . S/P right hip fracture 07/07/2019  . Right leg pain 06/26/2019  . Femoral fracture ([place]) 06/26/2019  . Hearing loss 05/01/2019  . Chronic left leg pain 11/20/2018  . Allodynia, left leg 11/20/2018  . Hamstring muscle strain, left, initial encounter 06/26/2018  . History of traumatic brain injury 02/23/2018  . High risk medication use, Combination of Opioid and Benzodiazapine 02/12/2018  . 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 SI (sacroiliac) pain 03/20/2017  . Enchondroma of left fibular head 05/17/2016  . Chronic pain syndrome 01/18/2016  . Anxiety state 08/08/2013  .  Cerumen impaction 08/06/2013  . Insomnia 02/02/2013  . Right flank pain 05/21/2011  . ASTHMA, INTERMITTENT 07/08/2010  . 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 Encounter for chronic pain management 07/04/2009  . Restless leg 09/29/2007  . Hyperlipidemia 11/13/2006  . Allergic rhinitis 11/13/2006  . GASTROESOPHAGEAL REFLUX, NO ESOPHAGITIS 11/13/2006  . Low back pain 11/13/2006  . Convulsions ([place]) 11/13/2006   Past Medical History:  Diagnosis Date  . 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  . Allergic rhinitis 11/13/2006        . Anxiety   . Anxiety state 08/08/2013  . Asthma   . ASTHMA, INTERMITTENT 07/08/2010   Qualifier: Diagnosis of  By: Richard Glass    . Back pain of lumbar region with sciatica 11/13/2006   MRI 12/2014 - lumbar DDD without focal neural impingement  MRI Lumbar 10/30/16 ([place]) Small central L5-S1 disc extrusion with minimal cranial migration and associated annular fissure approaches descending left S1 nerve roots without contact or displacement. Minimal bulging disc height loss without spinal canal stenosis or neural foraminal narrowing.  Minimal bulging disc at L2-L3 through L4-L5 without spinal canal stenosis or neural foraminal narrowing.  MRI Sacrum 10/30/16 ([place]) No fracture. Small lesion left sacral ala most likely a subchondral cyst/erosion adjacent to the sacroiliac joint.     . 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 Carpal tunnel syndrome 01/09/2015   Left 12/2014   . Cervical spine pain 02/06/2015   MRI 03/2015 FINDINGS: Vertebral body height, signal and alignment are normal. The craniocervical junction is normal and cervical cord signal is normal. The central spinal canal and neural foramina are widely patent at all levels. Scattered, mild degenerative change appears most notable at C4-5. Imaged paraspinous structures are unremarkable.  IMPRESSION: Negative for central canal or foraminal narrowing. No finding to explain the patient's symptoms. Scattered, mild facet degenerative disease is noted.   . Chronic low back pain   . Chronic pain 01/18/2016  . Chronic pain syndrome 01/18/2016  . Coccyx pain 02/06/2015  . Contact dermatitis or eczema 02/13/2018  . Convulsions ([place]) 11/13/2006     Cystic encephalomalacia left temporal and parietal regions from remote injury.   . Degenerative arthritis   . Dyslipidemia   . External hemorrhoid, bleeding 06/04/2011  . GASTROESOPHAGEAL REFLUX, NO ESOPHAGITIS 11/13/2006   Qualifier: Diagnosis of   By: Richard Glass    . Hamstring muscle strain, left, initial encounter 06/26/2018  . Headache(784.0)   . Hyperlipidemia 11/13/2006   07/2016  ASCVD score of 4.7% - recommended lifestyle changes   . Insomnia 02/02/2013  . Intention tremor 10/31/2009   Qualifier: Diagnosis of  By: Richard Glass    . Intercostal pain 04/25/2015  . Left foot pain 08/21/2018  . Left knee injury 05/09/2016  . 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 Morton's neuroma of left foot 01/18/2016  . Osteopenia 10/10/2016  . Overweight(278.02) 06/13/2009   Qualifier: Diagnosis of  By: Hedy Camara    . Pain in joint, shoulder region 07/05/2014   LEFT > Right Reports hx rotator cuff surgery on rigt shoulder previously (Dr Nicholes Stairs) but no notes available XRAY right shoulder showed some proliferative changes distal rigt clavicle   . Rash and nonspecific skin eruption 10/10/2016  . Restless leg 09/29/2007   Qualifier: Diagnosis of  By: Richard Glass    . Restless legs syndrome (RLS)   . Sciatica  of left side 10/26/2014  . Seizures ([place])    last >45yrs  . SI (sacroiliac) pain 03/20/2017  . Sinusitis chronic, frontal   . Status post lumbar spinal fusion 03/20/2017  . Subacromial or subdeltoid bursitis 08/08/2009   MRI C-spine 2011([facility]) - normal MRI left shoulder 10/2010 ([facility]) - AC degenerative disease, normal rotator cuff 12/2012 -debridement, acromioplasty and distal clavicle excision of left shoulder, arthroscopy also performed an no need for rotator cuff repair - performed by [facility] 02/2013 - Nerve Conduction Studies ([facility]) - ulnar nerve compression 02/2013- taken back to OR for Ulnar nerve decompression 06/2013 Repeat MRI left shoulder - subacromial/subdeltoid bursitis, a small intersubstance tear to the distal infraspinatus and evidence of interval resection of the distal clavicle 06/2013 - steroid injection of left biceps 09/2013 -Repeat EMG showed improvement of ulnar nerve compression 10/2013  - referred to [facility] for second opinion due to intractable pain 11/2013 - Seen by Dr. Marlou Sa at [facility]; discussed risks/benefits of surgical intervention and bursectomy, no plan for surgery at this time     . 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 Cancer Father        lung  . Heart disease Father   . Hypertension Mother   . Seizures Neg Hx     Past Surgical History:  Procedure Laterality Date  . [place]   struck by golf ball-49 yrs old  . CRANIOTOMY     metal plate  . HIP PINNING,CANNULATED Right 06/27/2019   Procedure: HIP PERCUTANEOUS PINNING;  Surgeon: Meredith Pel, Glass;  Location: [place];  Service: Orthopedics;  Laterality: Right;  . NASAL FRACTURE SURGERY  1997  . SACROILIAC JOINT FUSION Left 03/20/2017   Procedure: SACROILIAC JOINT FUSION;  Surgeon: Melina Schools, Glass;  Location: [facility];  Service: Orthopedics;  Laterality: Left;  90 mins  . SHOULDER ARTHROSCOPY Left    "cleaned arthritis out"  . SHOULDER SURGERY Right 2012   Rotator cuff surgery  . TONSILLECTOMY    . UVULOPALATOPHARYNGOPLASTY (UPPP)/TONSILLECTOMY/SEPTOPLASTY  2001   Social History   Occupational History  . Occupation: disabled  Tobacco Use  . Smoking status: Former Smoker    Packs/day: 1.00    Years: 22.00    Pack years: 22.00    Types: Cigarettes    Quit date: 06/22/2012    Years since quitting: 7.3  . Smokeless tobacco: Current User    Types: Snuff  Substance and Sexual Activity  . Alcohol use: No    Alcohol/week: 0.0 standard drinks    Comment: none since 7/11  . Drug use: No  . Sexual activity: Never

## 2019-11-10 DIAGNOSIS — Z9181 History of falling: Secondary | ICD-10-CM | POA: Diagnosis not present

## 2019-11-10 DIAGNOSIS — M5136 Other intervertebral disc degeneration, lumbar region: Secondary | ICD-10-CM | POA: Diagnosis not present

## 2019-11-10 DIAGNOSIS — G894 Chronic pain syndrome: Secondary | ICD-10-CM | POA: Diagnosis not present

## 2019-11-10 DIAGNOSIS — Z7901 Long term (current) use of anticoagulants: Secondary | ICD-10-CM | POA: Diagnosis not present

## 2019-11-10 DIAGNOSIS — H9190 Unspecified hearing loss, unspecified ear: Secondary | ICD-10-CM | POA: Diagnosis not present

## 2019-11-10 DIAGNOSIS — I10 Essential (primary) hypertension: Secondary | ICD-10-CM | POA: Diagnosis not present

## 2019-11-10 DIAGNOSIS — M533 Sacrococcygeal disorders, not elsewhere classified: Secondary | ICD-10-CM | POA: Diagnosis not present

## 2019-11-10 DIAGNOSIS — M8580 Other specified disorders of bone density and structure, unspecified site: Secondary | ICD-10-CM | POA: Diagnosis not present

## 2019-11-10 DIAGNOSIS — J302 Other seasonal allergic rhinitis: Secondary | ICD-10-CM | POA: Diagnosis not present

## 2019-11-10 DIAGNOSIS — Z87820 Personal history of traumatic brain injury: Secondary | ICD-10-CM | POA: Diagnosis not present

## 2019-11-10 DIAGNOSIS — R569 Unspecified convulsions: Secondary | ICD-10-CM | POA: Diagnosis not present

## 2019-11-10 DIAGNOSIS — E785 Hyperlipidemia, unspecified: Secondary | ICD-10-CM | POA: Diagnosis not present

## 2019-11-10 DIAGNOSIS — G4700 Insomnia, unspecified: Secondary | ICD-10-CM | POA: Diagnosis not present

## 2019-11-10 DIAGNOSIS — M1990 Unspecified osteoarthritis, unspecified site: Secondary | ICD-10-CM | POA: Diagnosis not present

## 2019-11-10 DIAGNOSIS — G252 Other specified forms of tremor: Secondary | ICD-10-CM | POA: Diagnosis not present

## 2019-11-10 DIAGNOSIS — G2581 Restless legs syndrome: Secondary | ICD-10-CM | POA: Diagnosis not present

## 2019-11-10 DIAGNOSIS — R208 Other disturbances of skin sensation: Secondary | ICD-10-CM | POA: Diagnosis not present

## 2019-11-10 DIAGNOSIS — J4520 Mild intermittent asthma, uncomplicated: Secondary | ICD-10-CM | POA: Diagnosis not present

## 2019-11-10 DIAGNOSIS — Z87891 Personal history of nicotine dependence: Secondary | ICD-10-CM | POA: Diagnosis not present

## 2019-11-10 DIAGNOSIS — K219 Gastro-esophageal reflux disease without esophagitis: Secondary | ICD-10-CM | POA: Diagnosis not present

## 2019-11-10 DIAGNOSIS — S72011D Unspecified intracapsular fracture of right femur, subsequent encounter for closed fracture with routine healing: Secondary | ICD-10-CM | POA: Diagnosis not present

## 2019-11-10 DIAGNOSIS — Z96641 Presence of right artificial hip joint: Secondary | ICD-10-CM | POA: Diagnosis not present

## 2019-11-11 ENCOUNTER — Other Ambulatory Visit: Payer: Self-pay

## 2019-11-11 ENCOUNTER — Ambulatory Visit (INDEPENDENT_AMBULATORY_CARE_PROVIDER_SITE_OTHER): Payer: Medicare Other | Admitting: Family Medicine

## 2019-11-11 ENCOUNTER — Ambulatory Visit (INDEPENDENT_AMBULATORY_CARE_PROVIDER_SITE_OTHER): Payer: Medicare Other | Admitting: Orthopaedic Surgery

## 2019-11-11 ENCOUNTER — Encounter: Payer: Self-pay | Admitting: Family Medicine

## 2019-11-11 VITALS — BP 118/64 | HR 64 | Wt 146.0 lb

## 2019-11-11 DIAGNOSIS — G894 Chronic pain syndrome: Secondary | ICD-10-CM

## 2019-11-11 DIAGNOSIS — S72001K Fracture of unspecified part of neck of right femur, subsequent encounter for closed fracture with nonunion: Secondary | ICD-10-CM | POA: Diagnosis not present

## 2019-11-11 DIAGNOSIS — Z8781 Personal history of (healed) traumatic fracture: Secondary | ICD-10-CM | POA: Diagnosis not present

## 2019-11-11 DIAGNOSIS — S728X1D Other fracture of right femur, subsequent encounter for closed fracture with routine healing: Secondary | ICD-10-CM | POA: Diagnosis not present

## 2019-11-11 DIAGNOSIS — R569 Unspecified convulsions: Secondary | ICD-10-CM | POA: Diagnosis not present

## 2019-11-11 DIAGNOSIS — T8484XA Pain due to internal orthopedic prosthetic devices, implants and grafts, initial encounter: Secondary | ICD-10-CM

## 2019-11-11 DIAGNOSIS — G4700 Insomnia, unspecified: Secondary | ICD-10-CM

## 2019-11-11 DIAGNOSIS — R634 Abnormal weight loss: Secondary | ICD-10-CM

## 2019-11-11 DIAGNOSIS — M79605 Pain in left leg: Secondary | ICD-10-CM

## 2019-11-11 MED ORDER — HYDROCODONE-ACETAMINOPHEN 7.5-325 MG PO TABS: 1.0000 | ORAL_TABLET | Freq: Four times a day (QID) | ORAL | 0 refills | Status: DC | PRN

## 2019-11-11 MED ORDER — AMITRIPTYLINE HCL 10 MG PO TABS: 10.0000 mg | ORAL_TABLET | Freq: Every day | ORAL | 11 refills | Status: DC

## 2019-11-11 MED ORDER — MELOXICAM 15 MG PO TABS: 15.0000 mg | ORAL_TABLET | Freq: Every day | ORAL | 0 refills | Status: DC

## 2019-11-11 NOTE — Assessment & Plan Note (Signed)
Established problem that has improved.  Improved with addition of amitriptyline 10 mg qhs a couple weeks ago by Dr Matilde Haymaker. Plan to continue amitriptyline 10 mg qhs for sleep and pain.

## 2019-11-11 NOTE — Assessment & Plan Note (Addendum)
Established problem Uncontrolled Pain from only partial union at right femoral neck fracture is decreasing appetite and causing weight loss.   Rx 30 additional Norco 7.5 mg tablets to chronic opioid therapy of 100 Norco 7.5 mg tablets daily.  Rx meloxicam 15 mg daily. Continue amitriptyline 10 mg qhs for pain and sleep

## 2019-11-11 NOTE — Assessment & Plan Note (Signed)
Established problem. Stable. Continue current therapy  Treatment is Phenobarb and Topamax

## 2019-11-11 NOTE — Patient Instructions (Addendum)
All the blood tests from January 28th were normal, which makes it less likely that a bad disease caused your weight loss.    Let us see how your weight does after you have your hip replaced and the pain under control.   Once you have your hip repaired and you have rehabilitated, we will arrange for you to have a colonoscopy to look for colon cancer.   To help with your pain control while you await hip replacement, you may take up to two additional Norco tablets a day along with restarting Meloxicam 15 mg daily.   Thirty tablets of Norco were sent to your pharmacy.  Your insurance will likely not cover the cost of these extra tablets.   Continue your Amitriptyline 10 mg tablet at bedtime.  This will help with your pain and your sleep.

## 2019-11-11 NOTE — Progress Notes (Signed)
Office Visit Note   Patient: Richard Glass           Date of Birth: 1971/04/06           MRN: [id number] Visit Date: 11/11/2019              Requested by: [name], [name], MD [address] [place],  [place] [address] PCP: [name], [name], MD   Assessment & Plan: Visit Diagnoses:  1. 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 S/P right hip fracture   3. Painful orthopaedic hardware [facility])     Plan: My impression is nonunion of right femoral neck fracture status post hip pinning October 2020.  I reviewed the CT scan in detail and it does show that he has nonunion mainly in the inferior posterior portion of the femoral neck.  He does have some areas of bony consolidation.  These findings were reviewed with the patient in detail and we had a lengthy discussion on nonsurgical versus surgical treatment in the form of conversion to a total hip replacement with hardware removal.  Risk benefits alternatives were discussed.  Patient has elected to proceed surgical treatment.  Questions encouraged and answered.  Total face to face encounter time was greater than 25 minutes and over half of this time was spent in counseling and/or coordination of care.  Follow-Up Instructions: Return for 2 week postop visit.   Orders:  No orders of the defined types were placed in this encounter.  No orders of the defined types were placed in this encounter.     Procedures: No procedures performed   Clinical Data: No additional findings.   Subjective: Chief Complaint  Patient presents with  . Right Hip - Pain    [name] is a 49 year old gentleman who is a referral from Dr. Marlou Glass for chronic right hip pain.  He is status post hip pinning on 06/27/2019 for subcapital femoral neck fracture.  Recent CT scan showed a partial nonunion of the fracture.  He continues to have 8 out of 10 pain in the groin that radiates down into the thigh and knee.  He is ambulating with a  cane and walker.  He feels that he cannot put his full body weight on the right leg due to the pain and as a result giving him imbalance.     Review of Systems  Constitutional: Negative.   All other systems reviewed and are negative.    Objective: Vital Signs: There were no vitals taken for this visit.  Physical Exam Vitals and nursing note reviewed.  Constitutional:      Appearance: He is well-developed.  Pulmonary:     Effort: Pulmonary effort is normal.  Abdominal:     Palpations: Abdomen is soft.  Skin:    General: Skin is warm.  Neurological:     Mental Status: He is alert and oriented to person, place, and time.  Psychiatric:        Behavior: Behavior normal.        Thought Content: Thought content normal.        Judgment: Judgment normal.     Ortho Exam Right hip exam shows a fully healed surgical scar.  He is tender over the scar and the screw heads.  He guards from a fully weightbearing on the right leg.  Movement of the leg causes severe pain.  He ambulates with a cane very slowly.  He also has quite a limp. Specialty  Comments:  No specialty comments available.  Imaging: No results found.   PMFS History: Patient Active Problem List   Diagnosis Date Noted  . Painful orthopaedic hardware ([place]) 11/11/2019  . 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 Weight loss, non-intentional 10/14/2019  . Bilateral leg weakness 09/03/2019  . S/P right hip fracture 07/07/2019  . Right leg pain 06/26/2019  . Femoral fracture ([place]) 06/26/2019  . Hearing loss 05/01/2019  . Chronic left leg pain 11/20/2018  . Allodynia, left leg 11/20/2018  . History of traumatic brain injury 02/23/2018  . High risk medication use, Combination of Opioid and Benzodiazapine 02/12/2018  . 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 SI (sacroiliac) pain 03/20/2017  . Enchondroma of left fibular head 05/17/2016  .  Chronic pain syndrome 01/18/2016  . Anxiety state 08/08/2013  . Cerumen impaction 08/06/2013  . Insomnia 02/02/2013  . Right flank pain 05/21/2011  . 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 Encounter for chronic pain management 07/04/2009  . Restless leg 09/29/2007  . Hyperlipidemia 11/13/2006  . Allergic rhinitis 11/13/2006  . GASTROESOPHAGEAL REFLUX, NO ESOPHAGITIS 11/13/2006  . Low back pain 11/13/2006  . Convulsions ([place]) 11/13/2006  . Seizure ([place]) 11/13/2006   Past Medical History:  Diagnosis Date  . 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  . Allergic rhinitis 11/13/2006       . Anxiety   . Anxiety state 08/08/2013  . Asthma   . ASTHMA, INTERMITTENT 07/08/2010   Qualifier: Diagnosis of  By: Walker Kehr MD, Patrick Jupiter    . Back pain of lumbar region with sciatica 11/13/2006   MRI 12/2014 - lumbar DDD without focal neural impingement  MRI Lumbar 10/30/16 ([place]) Small central L5-S1 disc extrusion with minimal cranial migration and associated annular fissure approaches descending left S1 nerve roots without contact or displacement. Minimal bulging disc height loss without spinal canal stenosis or neural foraminal narrowing.  Minimal bulging disc at L2-L3 through L4-L5 without spinal canal stenosis or neural foraminal narrowing.  MRI Sacrum 10/30/16 ([place]) No fracture. Small lesion left sacral ala most likely a subchondral cyst/erosion adjacent to the sacroiliac joint.     . 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 Carpal tunnel syndrome 01/09/2015   Left 12/2014   . Cervical spine pain 02/06/2015   MRI 03/2015 FINDINGS: Vertebral body height, signal and alignment are normal. The craniocervical junction is normal and cervical cord signal is normal. The central spinal canal and neural foramina are widely patent at all levels. Scattered, mild degenerative change appears most notable at  C4-5. Imaged paraspinous structures are unremarkable.  IMPRESSION: Negative for central canal or foraminal narrowing. No finding to explain the patient's symptoms. Scattered, mild facet degenerative disease is noted.   . Chronic low back pain   . Chronic pain 01/18/2016  . Chronic pain syndrome 01/18/2016  . Coccyx pain 02/06/2015  . Contact dermatitis or eczema 02/13/2018  . Convulsions ([place]) 11/13/2006     Cystic encephalomalacia left temporal and parietal regions from remote injury.   . Degenerative arthritis   . Dyslipidemia   . External hemorrhoid, bleeding 06/04/2011  . GASTROESOPHAGEAL REFLUX, NO ESOPHAGITIS 11/13/2006   Qualifier: Diagnosis of  By: Eusebio Friendly    . Hamstring muscle strain, left, initial encounter 06/26/2018  . Headache(784.0)   . Hyperlipidemia 11/13/2006   07/2016  ASCVD score  of 4.7% - recommended lifestyle changes   . Insomnia 02/02/2013  . Intention tremor 10/31/2009   Qualifier: Diagnosis of  By: Walker Kehr MD, Patrick Jupiter    . Intercostal pain 04/25/2015  . Left foot pain 08/21/2018  . Left knee injury 05/09/2016  . 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 Morton's neuroma of left foot 01/18/2016  . Osteopenia 10/10/2016  . Overweight(278.02) 06/13/2009   Qualifier: Diagnosis of  By: Hedy Camara    . Pain in joint, shoulder region 07/05/2014   LEFT > Right Reports hx rotator cuff surgery on rigt shoulder previously (Dr Nicholes Stairs) but no notes available XRAY right shoulder showed some proliferative changes distal rigt clavicle   . Rash and nonspecific skin eruption 10/10/2016  . Restless leg 09/29/2007   Qualifier: Diagnosis of  By: Walker Kehr MD, Patrick Jupiter    . Restless legs syndrome (RLS)   . Sciatica of left side 10/26/2014  . Seizures ([place])    last >14yrs  . SI (sacroiliac) pain 03/20/2017  . Sinusitis chronic, frontal   . Status post lumbar spinal fusion 03/20/2017  . Subacromial or subdeltoid bursitis 08/08/2009   MRI C-spine 2011([facility]) - normal MRI left shoulder 10/2010  ([facility]) - AC degenerative disease, normal rotator cuff 12/2012 -debridement, acromioplasty and distal clavicle excision of left shoulder, arthroscopy also performed an no need for rotator cuff repair - performed by [facility] 02/2013 - Nerve Conduction Studies ([facility]) - ulnar nerve compression 02/2013- taken back to OR for Ulnar nerve decompression 06/2013 Repeat MRI left shoulder - subacromial/subdeltoid bursitis, a small intersubstance tear to the distal infraspinatus and evidence of interval resection of the distal clavicle 06/2013 - steroid injection of left biceps 09/2013 -Repeat EMG showed improvement of ulnar nerve compression 10/2013 - referred to [facility] for second opinion due to intractable pain 11/2013 - Se[facility] by Dr[facility] Marlou Glass at [facility]; discussed risks/benefits of surgical intervention and bursectomy, no plan for surgery at this time     . 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 Cancer Father        lung  . Heart disease Father   . Hypertension Mother   . Seizures Neg Hx     Past Surgical History:  Procedure Laterality Date  . [place]   struck by golf ball-49 yrs old  . CRANIOTOMY     metal plate  . HIP PINNING,CANNULATED Right 06/27/2019   Procedure: HIP PERCUTANEOUS PINNING;  Surgeon: Meredith Pel, MD;  Location: [place];  Service: Orthopedics;  Laterality: Right;  . NASAL FRACTURE SURGERY  1997  . SACROILIAC JOINT FUSION Left 03/20/2017   Procedure: SACROILIAC JOINT FUSION;  Surgeon: Melina Schools, MD;  Location: [place];  Service: Orthopedics;  Laterality: Left;  90 mins  . SHOULDER ARTHROSCOPY Left    "cleaned arthritis out"  . SHOULDER SURGERY Right 2012   Rotator cuff surgery  . TONSILLECTOMY    . UVULOPALATOPHARYNGOPLASTY (UPPP)/TONSILLECTOMY/SEPTOPLASTY  2001   Social History     Occupational History  . Occupation: disabled  Tobacco Use  . Smoking status: Former Smoker    Packs/day: 1.00    Years: 22.00    Pack years: 22.00    Types: Cigarettes    Quit date: 06/22/2012    Years since quitting: 7.3  . Smokeless tobacco: Current User  Types: Snuff  Substance and Sexual Activity  . Alcohol use: No    Alcohol/week: 0.0 standard drinks    Comment: none since 7/11  . Drug use: No  . Sexual activity: Never

## 2019-11-12 NOTE — Assessment & Plan Note (Signed)
Wt Readings from Last 3 Encounters:  11/11/19 146 lb (66.2 kg)  10/14/19 150 lb (68 kg)  09/03/19 140 lb (63.5 kg)   4 pounds down from Last office visit. No clear recent wt trajectory.  Broad biophysical profile unremarkable.  Pt now getting food delivered to his home thanks to our Education officer, museum.  Suspect any weight loss that is beyond random fluctuation may be due to his right leg pain.   Will provide a one-time #30 tablets additional Norco 7.5 mg tablets which would allow him two additional doses of opioid above his chronic pain opioid therapy. This is to help him while he is awaiting his up coming right THR by Dr Erlinda Hong.   Restart daily meloxicam 15 mg while awaiting surgery.

## 2019-11-12 NOTE — Assessment & Plan Note (Signed)
Will provide a one-time #30 tablets additional Norco 7.5 mg tablets which would allow him two additional doses of opioid above his chronic pain opioid therapy. This is to help him while he is awaiting his up coming right THR by Dr Erlinda Hong.   Restart daily meloxicam 15 mg while awaiting surgery Continue amitriptyline 10 mg daily

## 2019-11-16 ENCOUNTER — Other Ambulatory Visit: Payer: Self-pay

## 2019-11-16 DIAGNOSIS — E785 Hyperlipidemia, unspecified: Secondary | ICD-10-CM | POA: Diagnosis not present

## 2019-11-16 DIAGNOSIS — G252 Other specified forms of tremor: Secondary | ICD-10-CM | POA: Diagnosis not present

## 2019-11-16 DIAGNOSIS — H9190 Unspecified hearing loss, unspecified ear: Secondary | ICD-10-CM | POA: Diagnosis not present

## 2019-11-16 DIAGNOSIS — Z7901 Long term (current) use of anticoagulants: Secondary | ICD-10-CM | POA: Diagnosis not present

## 2019-11-16 DIAGNOSIS — Z96641 Presence of right artificial hip joint: Secondary | ICD-10-CM | POA: Diagnosis not present

## 2019-11-16 DIAGNOSIS — G2581 Restless legs syndrome: Secondary | ICD-10-CM | POA: Diagnosis not present

## 2019-11-16 DIAGNOSIS — M533 Sacrococcygeal disorders, not elsewhere classified: Secondary | ICD-10-CM | POA: Diagnosis not present

## 2019-11-16 DIAGNOSIS — M5136 Other intervertebral disc degeneration, lumbar region: Secondary | ICD-10-CM | POA: Diagnosis not present

## 2019-11-16 DIAGNOSIS — J302 Other seasonal allergic rhinitis: Secondary | ICD-10-CM | POA: Diagnosis not present

## 2019-11-16 DIAGNOSIS — S72011D Unspecified intracapsular fracture of right femur, subsequent encounter for closed fracture with routine healing: Secondary | ICD-10-CM | POA: Diagnosis not present

## 2019-11-16 DIAGNOSIS — M1990 Unspecified osteoarthritis, unspecified site: Secondary | ICD-10-CM | POA: Diagnosis not present

## 2019-11-16 DIAGNOSIS — G4700 Insomnia, unspecified: Secondary | ICD-10-CM | POA: Diagnosis not present

## 2019-11-16 DIAGNOSIS — G894 Chronic pain syndrome: Secondary | ICD-10-CM | POA: Diagnosis not present

## 2019-11-16 DIAGNOSIS — R208 Other disturbances of skin sensation: Secondary | ICD-10-CM | POA: Diagnosis not present

## 2019-11-16 DIAGNOSIS — R569 Unspecified convulsions: Secondary | ICD-10-CM | POA: Diagnosis not present

## 2019-11-16 DIAGNOSIS — J4520 Mild intermittent asthma, uncomplicated: Secondary | ICD-10-CM | POA: Diagnosis not present

## 2019-11-16 DIAGNOSIS — K219 Gastro-esophageal reflux disease without esophagitis: Secondary | ICD-10-CM | POA: Diagnosis not present

## 2019-11-16 DIAGNOSIS — I10 Essential (primary) hypertension: Secondary | ICD-10-CM | POA: Diagnosis not present

## 2019-11-16 DIAGNOSIS — M8580 Other specified disorders of bone density and structure, unspecified site: Secondary | ICD-10-CM | POA: Diagnosis not present

## 2019-11-16 DIAGNOSIS — Z87891 Personal history of nicotine dependence: Secondary | ICD-10-CM | POA: Diagnosis not present

## 2019-11-16 DIAGNOSIS — Z87820 Personal history of traumatic brain injury: Secondary | ICD-10-CM | POA: Diagnosis not present

## 2019-11-16 DIAGNOSIS — Z9181 History of falling: Secondary | ICD-10-CM | POA: Diagnosis not present

## 2019-11-17 NOTE — Pre-Procedure Instructions (Signed)
[id number] [name], [place] [id number] [place] [id number] Phone: [phone] Fax: [phone]  [id number] [name], [place] - [address] [facility] [address] [place] [address] Phone: [phone] Fax: [phone]  [name] [place], [place] - [address] [place] [address] Phone: [phone] Fax: [phone]      Your procedure is scheduled on Monday, March 8th.   Report to [facility] Main Entrance "A" at 07:45 A.M., and check in at the Admitting office.   Call this number if you have problems the morning of surgery:  [phone]  Call [phone] if you have any questions prior to your surgery date Monday-Friday 8am-4pm.    Remember:  Do not eat after midnight the night before your surgery.  You may drink clear liquids until 06:45 AM the morning of your surgery.    Clear liquids allowed are: Water, Non-Citrus Juices (without pulp), Carbonated Beverages, Clear Tea, Black Coffee Only, and Gatorade.    Enhanced Recovery after Surgery for Orthopedics Enhanced Recovery after Surgery is a protocol used to improve the stress on your body and your recovery after surgery.    . The day of surgery:  o Drink ONE (1) Pre-Surgery Clear Ensure  by 06:45 AM.   o This drink was given to you during your hospital  pre-op appointment visit. o Nothing else to drink after completing the  Pre-Surgery Clear Ensure.  Please, if able, drink it in one setting. DO NOT SIP.     Take these medicines the morning of surgery with A SIP OF WATER : baclofen (LIORESAL) gabapentin (NEURONTIN)  topiramate (TOPAMAX) 50 mg  IF NEEDED: acetaminophen (TYLENOL)  ALPRAZolam (XANAX) HYDROcodone-acetaminophen (NORCO) fluticasone (FLONASE) nasal spray   As of today, STOP taking any Aspirin (unless  otherwise instructed by your surgeon), other Non-steroidal Anti-inflammatories such as meloxicam (MOBIC), Aleve, Naproxen, Ibuprofen, Motrin, Advil, Goody's, BC's, all herbal medications, fish oil, and all vitamins.    The Morning of Surgery  Do not wear jewelry.  Do not wear lotions, powders, colognes, or deodorant.  Men may shave face and neck.  Do not bring valuables to the hospital.  [facility] is not responsible for any belongings or valuables.  If you are a smoker, DO NOT Smoke 24 hours prior to surgery.  If you wear a CPAP at night please bring your mask the morning of surgery.   Remember that you must have someone to transport you home after your surgery, and remain with you for 24 hours if you are discharged the same day.   Please bring cases for contacts, glasses, hearing aids, dentures or bridgework because it cannot be worn into surgery.    Leave your suitcase in the car.  After surgery it may be brought to your room.  For patients admitted to the hospital, discharge time will be determined by your treatment team.  Patients discharged the day of surgery will not be allowed to drive home.    Special instructions:   [facility]- Preparing For Surgery  Before surgery, you can play an important role. Because skin is not sterile, your skin needs to be as free of germs as possible. You can reduce the number of germs on your skin by washing with CHG (chlorahexidine gluconate) Soap before surgery.  CHG is an antiseptic cleaner which kills germs and  bonds with the skin to continue killing germs even after washing.    Oral Hygiene is also important to reduce your risk of infection.  Remember - BRUSH YOUR TEETH THE MORNING OF SURGERY WITH YOUR REGULAR TOOTHPASTE  Please do not use if you have an allergy to CHG or antibacterial soaps. If your skin becomes reddened/irritated stop using the CHG.  Do not shave (including legs and underarms) for at least 48 hours prior to first CHG  shower. It is OK to shave your face.  Please follow these instructions carefully.   1. Shower the NIGHT BEFORE SURGERY and the MORNING OF SURGERY with CHG Soap.   2. If you chose to wash your hair, wash your hair first as usual with your normal shampoo.  3. After you shampoo, rinse your hair and body thoroughly to remove the shampoo.  4. Use CHG as you would any other liquid soap. You can apply CHG directly to the skin and wash gently with a scrungie or a clean washcloth.   5. Apply the CHG Soap to your body ONLY FROM THE NECK DOWN.  Do not use on open wounds or open sores. Avoid contact with your eyes, ears, mouth and genitals (private parts). Wash Face and genitals (private parts)  with your normal soap.   6. Wash thoroughly, paying special attention to the area where your surgery will be performed.  7. Thoroughly rinse your body with warm water from the neck down.  8. DO NOT shower/wash with your normal soap after using and rinsing off the CHG Soap.  9. Pat yourself dry with a CLEAN TOWEL.  10. Wear CLEAN PAJAMAS to bed the night before surgery, wear comfortable clothes the morning of surgery  11. Place CLEAN SHEETS on your bed the night of your first shower and DO NOT SLEEP WITH PETS.    Day of Surgery:  Please shower the morning of surgery with the CHG soap Do not apply any deodorants/lotions. Please wear clean clothes to the hospital/surgery center.   Remember to brush your teeth WITH YOUR REGULAR TOOTHPASTE.   Please read over the following fact sheets that you were given.

## 2019-11-18 ENCOUNTER — Other Ambulatory Visit: Payer: Self-pay

## 2019-11-18 ENCOUNTER — Encounter (HOSPITAL_COMMUNITY): Payer: Self-pay

## 2019-11-18 ENCOUNTER — Other Ambulatory Visit (HOSPITAL_COMMUNITY)
Admission: RE | Admit: 2019-11-18 | Discharge: 2019-11-18 | Disposition: A | Discharge status: Home / Self Care | Payer: Medicare Other | Source: Ambulatory Visit | Attending: Orthopaedic Surgery | Admitting: Orthopaedic Surgery

## 2019-11-18 ENCOUNTER — Encounter (HOSPITAL_COMMUNITY)
Admission: RE | Admit: 2019-11-18 | Discharge: 2019-11-18 | Disposition: A | Discharge status: Home / Self Care | Payer: Medicare Other | Source: Ambulatory Visit | Attending: Orthopaedic Surgery | Admitting: Orthopaedic Surgery

## 2019-11-18 DIAGNOSIS — M533 Sacrococcygeal disorders, not elsewhere classified: Secondary | ICD-10-CM | POA: Diagnosis not present

## 2019-11-18 DIAGNOSIS — M1990 Unspecified osteoarthritis, unspecified site: Secondary | ICD-10-CM | POA: Diagnosis not present

## 2019-11-18 DIAGNOSIS — M25551 Pain in right hip: Secondary | ICD-10-CM | POA: Diagnosis not present

## 2019-11-18 DIAGNOSIS — M8580 Other specified disorders of bone density and structure, unspecified site: Secondary | ICD-10-CM | POA: Diagnosis not present

## 2019-11-18 DIAGNOSIS — Z01812 Encounter for preprocedural laboratory examination: Secondary | ICD-10-CM | POA: Diagnosis not present

## 2019-11-18 DIAGNOSIS — Z20822 Contact with and (suspected) exposure to covid-19: Secondary | ICD-10-CM | POA: Insufficient documentation

## 2019-11-18 DIAGNOSIS — R208 Other disturbances of skin sensation: Secondary | ICD-10-CM | POA: Diagnosis not present

## 2019-11-18 DIAGNOSIS — Z9181 History of falling: Secondary | ICD-10-CM | POA: Diagnosis not present

## 2019-11-18 DIAGNOSIS — Z01810 Encounter for preprocedural cardiovascular examination: Secondary | ICD-10-CM | POA: Insufficient documentation

## 2019-11-18 DIAGNOSIS — J4520 Mild intermittent asthma, uncomplicated: Secondary | ICD-10-CM | POA: Diagnosis not present

## 2019-11-18 DIAGNOSIS — Z7901 Long term (current) use of anticoagulants: Secondary | ICD-10-CM | POA: Diagnosis not present

## 2019-11-18 DIAGNOSIS — K219 Gastro-esophageal reflux disease without esophagitis: Secondary | ICD-10-CM | POA: Diagnosis not present

## 2019-11-18 DIAGNOSIS — Z87820 Personal history of traumatic brain injury: Secondary | ICD-10-CM | POA: Diagnosis not present

## 2019-11-18 DIAGNOSIS — M5136 Other intervertebral disc degeneration, lumbar region: Secondary | ICD-10-CM | POA: Diagnosis not present

## 2019-11-18 DIAGNOSIS — H9190 Unspecified hearing loss, unspecified ear: Secondary | ICD-10-CM | POA: Diagnosis not present

## 2019-11-18 DIAGNOSIS — G2581 Restless legs syndrome: Secondary | ICD-10-CM | POA: Diagnosis not present

## 2019-11-18 DIAGNOSIS — S72011D Unspecified intracapsular fracture of right femur, subsequent encounter for closed fracture with routine healing: Secondary | ICD-10-CM | POA: Diagnosis not present

## 2019-11-18 DIAGNOSIS — G894 Chronic pain syndrome: Secondary | ICD-10-CM | POA: Diagnosis not present

## 2019-11-18 DIAGNOSIS — Z96641 Presence of right artificial hip joint: Secondary | ICD-10-CM | POA: Diagnosis not present

## 2019-11-18 DIAGNOSIS — G4700 Insomnia, unspecified: Secondary | ICD-10-CM | POA: Diagnosis not present

## 2019-11-18 DIAGNOSIS — Z87891 Personal history of nicotine dependence: Secondary | ICD-10-CM | POA: Diagnosis not present

## 2019-11-18 DIAGNOSIS — I10 Essential (primary) hypertension: Secondary | ICD-10-CM | POA: Diagnosis not present

## 2019-11-18 DIAGNOSIS — E785 Hyperlipidemia, unspecified: Secondary | ICD-10-CM | POA: Diagnosis not present

## 2019-11-18 DIAGNOSIS — G252 Other specified forms of tremor: Secondary | ICD-10-CM | POA: Diagnosis not present

## 2019-11-18 DIAGNOSIS — J302 Other seasonal allergic rhinitis: Secondary | ICD-10-CM | POA: Diagnosis not present

## 2019-11-18 DIAGNOSIS — R569 Unspecified convulsions: Secondary | ICD-10-CM | POA: Diagnosis not present

## 2019-11-18 LAB — CBC WITH DIFFERENTIAL/PLATELET
Abs Immature Granulocytes: 0.02 10*3/uL (ref 0.00–0.07)
Basophils Absolute: 0 10*3/uL (ref 0.0–0.1)
Basophils Relative: 0 %
Eosinophils Absolute: 0.1 10*3/uL (ref 0.0–0.5)
Eosinophils Relative: 2 %
HCT: 37.4 % — ABNORMAL LOW (ref 39.0–52.0)
Hemoglobin: 12.1 g/dL — ABNORMAL LOW (ref 13.0–17.0)
Immature Granulocytes: 0 %
Lymphocytes Relative: 32 %
Lymphs Abs: 2.1 10*3/uL (ref 0.7–4.0)
MCH: 29.9 pg (ref 26.0–34.0)
MCHC: 32.4 g/dL (ref 30.0–36.0)
MCV: 92.3 fL (ref 80.0–100.0)
Monocytes Absolute: 0.4 10*3/uL (ref 0.1–1.0)
Monocytes Relative: 6 %
Neutro Abs: 3.8 10*3/uL (ref 1.7–7.7)
Neutrophils Relative %: 60 %
Platelets: 318 10*3/uL (ref 150–400)
RBC: 4.05 MIL/uL — ABNORMAL LOW (ref 4.22–5.81)
RDW: 12.9 % (ref 11.5–15.5)
WBC: 6.4 10*3/uL (ref 4.0–10.5)
nRBC: 0 % (ref 0.0–0.2)

## 2019-11-18 LAB — COMPREHENSIVE METABOLIC PANEL
ALT: 18 U/L (ref 0–44)
AST: 21 U/L (ref 15–41)
Albumin: 4.2 g/dL (ref 3.5–5.0)
Alkaline Phosphatase: 64 U/L (ref 38–126)
Anion gap: 13 (ref 5–15)
BUN: 10 mg/dL (ref 6–20)
CO2: 24 mmol/L (ref 22–32)
Calcium: 8.7 mg/dL — ABNORMAL LOW (ref 8.9–10.3)
Chloride: 101 mmol/L (ref 98–111)
Creatinine, Ser: 0.88 mg/dL (ref 0.61–1.24)
GFR calc Af Amer: >60 mL/min (ref >60)
GFR calc non Af Amer: >60 mL/min (ref >60)
Glucose, Bld: 114 mg/dL — ABNORMAL HIGH (ref 70–99)
Potassium: 3.2 mmol/L — ABNORMAL LOW (ref 3.5–5.1)
Sodium: 138 mmol/L (ref 135–145)
Total Bilirubin: 0.3 mg/dL (ref 0.3–1.2)
Total Protein: 6.5 g/dL (ref 6.5–8.1)

## 2019-11-18 LAB — URINALYSIS, ROUTINE W REFLEX MICROSCOPIC
Bilirubin Urine: NEGATIVE
Glucose, UA: NEGATIVE mg/dL
Hgb urine dipstick: NEGATIVE
Ketones, ur: NEGATIVE mg/dL
Leukocytes,Ua: NEGATIVE
Nitrite: NEGATIVE
Protein, ur: NEGATIVE mg/dL
Specific Gravity, Urine: 1.017 (ref 1.005–1.030)
pH: 7 (ref 5.0–8.0)

## 2019-11-18 LAB — TYPE AND SCREEN
ABO/RH(D): B POS
Antibody Screen: NEGATIVE

## 2019-11-18 LAB — APTT: aPTT: 33 seconds (ref 24–36)

## 2019-11-18 LAB — PROTIME-INR
INR: 1.1 (ref 0.8–1.2)
Prothrombin Time: 13.7 seconds (ref 11.4–15.2)

## 2019-11-18 LAB — SURGICAL PCR SCREEN
MRSA, PCR: NEGATIVE
Staphylococcus aureus: NEGATIVE

## 2019-11-18 NOTE — Progress Notes (Signed)
PCP - Dr. Sherren Mocha [name] Cardiologist - denies Neurologist - Dr. Jannifer Franklin  PPM/ICD - denies  Chest x-ray - N/A EKG - 11/18/2019 Stress Test - denies ECHO - denies Cardiac Cath - denies  Sleep Study - denies  Blood Thinner Instructions: N/A Aspirin Instructions: N/A  ERAS Protcol - Yes PRE-SURGERY Ensure or G2- Ensure given  COVID TEST- Scheduled for today 11/18/2019 after PAT appointment. Patient verbalized understanding of self-quarantine instructions, appointment time and place.  Anesthesia review: No  Patient denies shortness of breath, fever, cough and chest pain at PAT appointment  All instructions explained to the patient, with a verbal understanding of the material. Patient agrees to go over the instructions while at home for a better understanding. Patient also instructed to self quarantine after being tested for COVID-19. The opportunity to ask questions was provided.

## 2019-11-19 LAB — SARS CORONAVIRUS 2 (TAT 6-24 HRS): SARS Coronavirus 2: NEGATIVE

## 2019-11-19 MED ORDER — BUPIVACAINE LIPOSOME 1.3 % IJ SUSP
20.0000 mL | Freq: Once | INTRAMUSCULAR | Status: DC
  Filled 2019-11-19: qty 20

## 2019-11-19 MED ORDER — TRANEXAMIC ACID 1000 MG/10ML IV SOLN
2000.0000 mg | INTRAVENOUS | Status: DC
  Filled 2019-11-19: qty 20

## 2019-11-21 ENCOUNTER — Other Ambulatory Visit: Payer: Self-pay | Admitting: Orthopaedic Surgery

## 2019-11-21 ENCOUNTER — Other Ambulatory Visit: Payer: Self-pay | Admitting: Family Medicine

## 2019-11-21 MED ORDER — OXYCODONE-ACETAMINOPHEN 5-325 MG PO TABS: 1.0000 | ORAL_TABLET | Freq: Three times a day (TID) | ORAL | 0 refills | Status: DC | PRN

## 2019-11-21 MED ORDER — METHOCARBAMOL 750 MG PO TABS: 750.0000 mg | ORAL_TABLET | Freq: Two times a day (BID) | ORAL | 3 refills | Status: DC | PRN

## 2019-11-21 MED ORDER — KETOROLAC TROMETHAMINE 10 MG PO TABS: 10.0000 mg | ORAL_TABLET | Freq: Two times a day (BID) | ORAL | 0 refills | Status: DC | PRN

## 2019-11-21 MED ORDER — ONDANSETRON HCL 4 MG PO TABS: 4.0000 mg | ORAL_TABLET | Freq: Three times a day (TID) | ORAL | 0 refills | Status: DC | PRN

## 2019-11-21 MED ORDER — SULFAMETHOXAZOLE-TRIMETHOPRIM 800-160 MG PO TABS: 1.0000 | ORAL_TABLET | Freq: Two times a day (BID) | ORAL | 0 refills | Status: DC

## 2019-11-21 MED ORDER — ASPIRIN EC 81 MG PO TBEC: 81.0000 mg | DELAYED_RELEASE_TABLET | Freq: Two times a day (BID) | ORAL | 0 refills | Status: DC

## 2019-11-21 NOTE — Anesthesia Preprocedure Evaluation (Addendum)
Anesthesia Evaluation  Patient identified by MRN, date of birth, ID band Patient awake    Reviewed: Allergy & Precautions, NPO status , Patient's Chart, lab work & pertinent test results  Airway Mallampati: I  TM Distance: >3 FB Neck ROM: Full    Dental no notable dental hx. (+) Teeth Intact, Dental Advisory Given   Pulmonary asthma , former smoker,    Pulmonary exam normal breath sounds clear to auscultation       Cardiovascular Exercise Tolerance: Good Normal cardiovascular exam Rhythm:Regular Rate:Normal     Neuro/Psych  Headaches, Seizures -, Well Controlled,  Anxiety    GI/Hepatic Neg liver ROS, GERD  ,  Endo/Other  negative endocrine ROS  Renal/GU Hgb 3.2 Cr  0.88     Musculoskeletal   Abdominal   Peds  Hematology Hgb 12.1 plt 318   Anesthesia Other Findings   Reproductive/Obstetrics                            Anesthesia Physical Anesthesia Plan  ASA: II  Anesthesia Plan: Spinal   Post-op Pain Management:    Induction:   PONV Risk Score and Plan: Treatment may vary due to age or medical condition, Ondansetron and Midazolam  Airway Management Planned: Nasal Cannula and Natural Airway  Additional Equipment: None  Intra-op Plan:   Post-operative Plan:   Informed Consent: I have reviewed the patients History and Physical, chart, labs and discussed the procedure including the risks, benefits and alternatives for the proposed anesthesia with the patient or authorized representative who has indicated his/her understanding and acceptance.     Dental advisory given  Plan Discussed with:   Anesthesia Plan Comments:        Anesthesia Quick Evaluation

## 2019-11-21 NOTE — Discharge Instructions (Signed)

## 2019-11-22 ENCOUNTER — Ambulatory Visit (HOSPITAL_COMMUNITY): Payer: Medicare Other | Admitting: Certified Registered"

## 2019-11-22 ENCOUNTER — Ambulatory Visit (HOSPITAL_COMMUNITY): Payer: Medicare Other | Admitting: Physician Assistant

## 2019-11-22 ENCOUNTER — Observation Stay (HOSPITAL_COMMUNITY): Payer: Medicare Other

## 2019-11-22 ENCOUNTER — Encounter (HOSPITAL_COMMUNITY): Payer: Self-pay | Admitting: Orthopaedic Surgery

## 2019-11-22 ENCOUNTER — Encounter (HOSPITAL_COMMUNITY)
Admission: RE | Disposition: A | Discharge status: Home / Self Care | Payer: Self-pay | Source: Home / Self Care | Attending: Orthopaedic Surgery

## 2019-11-22 ENCOUNTER — Other Ambulatory Visit: Payer: Self-pay

## 2019-11-22 ENCOUNTER — Ambulatory Visit (HOSPITAL_COMMUNITY): Payer: Medicare Other

## 2019-11-22 ENCOUNTER — Observation Stay (HOSPITAL_COMMUNITY)
Admission: RE | Admit: 2019-11-22 | Discharge: 2019-11-23 | Disposition: A | Discharge status: Home / Self Care | Payer: Medicare Other | Attending: Orthopaedic Surgery | Admitting: Orthopaedic Surgery

## 2019-11-22 DIAGNOSIS — R569 Unspecified convulsions: Secondary | ICD-10-CM | POA: Diagnosis not present

## 2019-11-22 DIAGNOSIS — Z7982 Long term (current) use of aspirin: Secondary | ICD-10-CM | POA: Insufficient documentation

## 2019-11-22 DIAGNOSIS — S72001K Fracture of unspecified part of neck of right femur, subsequent encounter for closed fracture with nonunion: Secondary | ICD-10-CM

## 2019-11-22 DIAGNOSIS — Z96649 Presence of unspecified artificial hip joint: Secondary | ICD-10-CM

## 2019-11-22 DIAGNOSIS — T8484XA Pain due to internal orthopedic prosthetic devices, implants and grafts, initial encounter: Secondary | ICD-10-CM | POA: Insufficient documentation

## 2019-11-22 DIAGNOSIS — Y831 Surgical operation with implant of artificial internal device as the cause of abnormal reaction of the patient, or of later complication, without mention of misadventure at the time of the procedure: Secondary | ICD-10-CM | POA: Insufficient documentation

## 2019-11-22 DIAGNOSIS — Z87891 Personal history of nicotine dependence: Secondary | ICD-10-CM | POA: Insufficient documentation

## 2019-11-22 DIAGNOSIS — M25551 Pain in right hip: Secondary | ICD-10-CM | POA: Insufficient documentation

## 2019-11-22 DIAGNOSIS — F419 Anxiety disorder, unspecified: Secondary | ICD-10-CM | POA: Insufficient documentation

## 2019-11-22 DIAGNOSIS — S72011K Unspecified intracapsular fracture of right femur, subsequent encounter for closed fracture with nonunion: Secondary | ICD-10-CM | POA: Diagnosis not present

## 2019-11-22 DIAGNOSIS — X58XXXD Exposure to other specified factors, subsequent encounter: Secondary | ICD-10-CM | POA: Insufficient documentation

## 2019-11-22 DIAGNOSIS — Z471 Aftercare following joint replacement surgery: Secondary | ICD-10-CM | POA: Diagnosis not present

## 2019-11-22 DIAGNOSIS — S72041A Displaced fracture of base of neck of right femur, initial encounter for closed fracture: Secondary | ICD-10-CM

## 2019-11-22 DIAGNOSIS — Z79899 Other long term (current) drug therapy: Secondary | ICD-10-CM | POA: Diagnosis not present

## 2019-11-22 DIAGNOSIS — J309 Allergic rhinitis, unspecified: Secondary | ICD-10-CM | POA: Diagnosis not present

## 2019-11-22 DIAGNOSIS — Z96641 Presence of right artificial hip joint: Secondary | ICD-10-CM | POA: Diagnosis not present

## 2019-11-22 DIAGNOSIS — Z419 Encounter for procedure for purposes other than remedying health state, unspecified: Secondary | ICD-10-CM

## 2019-11-22 DIAGNOSIS — E785 Hyperlipidemia, unspecified: Secondary | ICD-10-CM | POA: Diagnosis not present

## 2019-11-22 HISTORY — PX: HARDWARE REMOVAL: SHX979

## 2019-11-22 HISTORY — PX: TOTAL HIP ARTHROPLASTY: SHX124

## 2019-11-22 LAB — GLUCOSE, CAPILLARY: Glucose-Capillary: 117 mg/dL — ABNORMAL HIGH (ref 70–99)

## 2019-11-22 SURGERY — ARTHROPLASTY, HIP, TOTAL, ANTERIOR APPROACH
Anesthesia: Spinal | Site: Hip | Laterality: Right

## 2019-11-22 MED ORDER — PROPOFOL 500 MG/50ML IV EMUL
INTRAVENOUS | Status: DC | PRN
  Administered 2019-11-22: 75 ug/kg/min via INTRAVENOUS

## 2019-11-22 MED ORDER — CHLORHEXIDINE GLUCONATE 4 % EX LIQD: 60.0000 mL | Freq: Once | CUTANEOUS | Status: DC

## 2019-11-22 MED ORDER — AMITRIPTYLINE HCL 10 MG PO TABS
10.0000 mg | ORAL_TABLET | Freq: Every day | ORAL | Status: DC
  Administered 2019-11-22: 10 mg via ORAL
  Filled 2019-11-22 (×2): qty 1

## 2019-11-22 MED ORDER — BUPIVACAINE HCL (PF) 0.5 % IJ SOLN
INTRAMUSCULAR | Status: DC | PRN
  Administered 2019-11-22: 13 mg via INTRATHECAL

## 2019-11-22 MED ORDER — ONDANSETRON HCL 4 MG/2ML IJ SOLN
INTRAMUSCULAR | Status: DC | PRN
  Administered 2019-11-22: 4 mg via INTRAVENOUS

## 2019-11-22 MED ORDER — GLYCOPYRROLATE 0.2 MG/ML IJ SOLN
INTRAMUSCULAR | Status: DC | PRN
  Administered 2019-11-22 (×2): .1 mg via INTRAVENOUS

## 2019-11-22 MED ORDER — TRANEXAMIC ACID 1000 MG/10ML IV SOLN
INTRAVENOUS | Status: DC | PRN
  Administered 2019-11-22: 2000 mg via TOPICAL

## 2019-11-22 MED ORDER — PHENYLEPHRINE HCL-NACL 10-0.9 MG/250ML-% IV SOLN
INTRAVENOUS | Status: DC | PRN
  Administered 2019-11-22: 50 ug/min via INTRAVENOUS

## 2019-11-22 MED ORDER — METOCLOPRAMIDE HCL 5 MG PO TABS: 5.0000 mg | ORAL_TABLET | Freq: Three times a day (TID) | ORAL | Status: DC | PRN

## 2019-11-22 MED ORDER — TRANEXAMIC ACID-NACL 1000-0.7 MG/100ML-% IV SOLN
1000.0000 mg | Freq: Once | INTRAVENOUS | Status: DC
  Filled 2019-11-22: qty 100

## 2019-11-22 MED ORDER — POVIDONE-IODINE 10 % EX SWAB: 2.0000 "application " | Freq: Once | CUTANEOUS | Status: DC

## 2019-11-22 MED ORDER — SORBITOL 70 % SOLN
30.0000 mL | Freq: Every day | Status: DC | PRN
  Filled 2019-11-22: qty 30

## 2019-11-22 MED ORDER — PROMETHAZINE HCL 25 MG/ML IJ SOLN: 6.2500 mg | INTRAMUSCULAR | Status: DC | PRN

## 2019-11-22 MED ORDER — SODIUM CHLORIDE 0.9% FLUSH
INTRAVENOUS | Status: DC | PRN
  Administered 2019-11-22 (×2): 10 mL via INTRADERMAL

## 2019-11-22 MED ORDER — BACLOFEN 5 MG HALF TABLET
5.0000 mg | ORAL_TABLET | Freq: Three times a day (TID) | ORAL | Status: DC
  Administered 2019-11-22 – 2019-11-23 (×3): 5 mg via ORAL
  Filled 2019-11-22 (×5): qty 1

## 2019-11-22 MED ORDER — ACETAMINOPHEN 500 MG PO TABS
1000.0000 mg | ORAL_TABLET | Freq: Four times a day (QID) | ORAL | Status: AC
  Administered 2019-11-22 – 2019-11-23 (×4): 1000 mg via ORAL
  Filled 2019-11-22 (×4): qty 2

## 2019-11-22 MED ORDER — POLYETHYLENE GLYCOL 3350 17 G PO PACK: 17.0000 g | PACK | Freq: Every day | ORAL | Status: DC | PRN

## 2019-11-22 MED ORDER — BUPIVACAINE LIPOSOME 1.3 % IJ SUSP
10.0000 mL | Freq: Once | INTRAMUSCULAR | Status: DC
  Filled 2019-11-22: qty 10

## 2019-11-22 MED ORDER — DIPHENHYDRAMINE HCL 12.5 MG/5ML PO ELIX
25.0000 mg | ORAL_SOLUTION | ORAL | Status: DC | PRN
  Filled 2019-11-22: qty 10

## 2019-11-22 MED ORDER — DEXAMETHASONE SODIUM PHOSPHATE 10 MG/ML IJ SOLN
10.0000 mg | Freq: Once | INTRAMUSCULAR | Status: AC
  Administered 2019-11-23: 10 mg via INTRAVENOUS
  Filled 2019-11-22: qty 1

## 2019-11-22 MED ORDER — OXYCODONE HCL 5 MG PO TABS
5.0000 mg | ORAL_TABLET | ORAL | Status: DC | PRN
  Administered 2019-11-22: 10 mg via ORAL
  Administered 2019-11-23: 5 mg via ORAL
  Filled 2019-11-22: qty 2
  Filled 2019-11-22: qty 1

## 2019-11-22 MED ORDER — OXYCODONE HCL ER 10 MG PO T12A
10.0000 mg | EXTENDED_RELEASE_TABLET | Freq: Two times a day (BID) | ORAL | Status: DC
  Administered 2019-11-22 – 2019-11-23 (×3): 10 mg via ORAL
  Filled 2019-11-22 (×3): qty 1

## 2019-11-22 MED ORDER — MIDAZOLAM HCL 2 MG/2ML IJ SOLN
INTRAMUSCULAR | Status: AC
  Filled 2019-11-22: qty 2

## 2019-11-22 MED ORDER — LACTATED RINGERS IV SOLN: INTRAVENOUS | Status: DC

## 2019-11-22 MED ORDER — ONDANSETRON HCL 4 MG PO TABS: 4.0000 mg | ORAL_TABLET | Freq: Four times a day (QID) | ORAL | Status: DC | PRN

## 2019-11-22 MED ORDER — PHENOBARBITAL 97.2 MG PO TABS
194.4000 mg | ORAL_TABLET | Freq: Every day | ORAL | Status: DC
  Administered 2019-11-22: 194.4 mg via ORAL
  Filled 2019-11-22 (×2): qty 2

## 2019-11-22 MED ORDER — TOPIRAMATE 100 MG PO TABS
100.0000 mg | ORAL_TABLET | Freq: Every day | ORAL | Status: DC
  Administered 2019-11-22: 100 mg via ORAL
  Filled 2019-11-22: qty 1

## 2019-11-22 MED ORDER — MENTHOL 3 MG MT LOZG: 1.0000 | LOZENGE | OROMUCOSAL | Status: DC | PRN

## 2019-11-22 MED ORDER — MEPERIDINE HCL 25 MG/ML IJ SOLN: 6.2500 mg | INTRAMUSCULAR | Status: DC | PRN

## 2019-11-22 MED ORDER — HYDROCODONE-ACETAMINOPHEN 7.5-325 MG PO TABS: 1.0000 | ORAL_TABLET | Freq: Once | ORAL | Status: DC | PRN

## 2019-11-22 MED ORDER — GABAPENTIN 600 MG PO TABS
1200.0000 mg | ORAL_TABLET | Freq: Three times a day (TID) | ORAL | Status: DC
  Administered 2019-11-22 – 2019-11-23 (×3): 1200 mg via ORAL
  Filled 2019-11-22 (×3): qty 2

## 2019-11-22 MED ORDER — CEFAZOLIN SODIUM-DEXTROSE 2-4 GM/100ML-% IV SOLN
INTRAVENOUS | Status: AC
  Filled 2019-11-22: qty 100

## 2019-11-22 MED ORDER — TRANEXAMIC ACID-NACL 1000-0.7 MG/100ML-% IV SOLN
1000.0000 mg | INTRAVENOUS | Status: AC
  Administered 2019-11-22: 08:00:00 1000 mg via INTRAVENOUS

## 2019-11-22 MED ORDER — MAGNESIUM CITRATE PO SOLN: 1.0000 | Freq: Once | ORAL | Status: DC | PRN

## 2019-11-22 MED ORDER — 0.9 % SODIUM CHLORIDE (POUR BTL) OPTIME
TOPICAL | Status: DC | PRN
  Administered 2019-11-22: 1000 mL

## 2019-11-22 MED ORDER — CEFAZOLIN SODIUM-DEXTROSE 2-4 GM/100ML-% IV SOLN
2.0000 g | INTRAVENOUS | Status: AC
  Administered 2019-11-22: 2 g via INTRAVENOUS

## 2019-11-22 MED ORDER — ONDANSETRON HCL 4 MG/2ML IJ SOLN: 4.0000 mg | Freq: Four times a day (QID) | INTRAMUSCULAR | Status: DC | PRN

## 2019-11-22 MED ORDER — TRANEXAMIC ACID-NACL 1000-0.7 MG/100ML-% IV SOLN
INTRAVENOUS | Status: AC
  Filled 2019-11-22: qty 100

## 2019-11-22 MED ORDER — CEFAZOLIN SODIUM-DEXTROSE 2-4 GM/100ML-% IV SOLN
2.0000 g | Freq: Four times a day (QID) | INTRAVENOUS | Status: AC
  Administered 2019-11-22 – 2019-11-23 (×3): 2 g via INTRAVENOUS
  Filled 2019-11-22 (×3): qty 100

## 2019-11-22 MED ORDER — METHOCARBAMOL 1000 MG/10ML IJ SOLN
500.0000 mg | Freq: Four times a day (QID) | INTRAVENOUS | Status: DC | PRN
  Filled 2019-11-22: qty 5

## 2019-11-22 MED ORDER — ASPIRIN 81 MG PO CHEW
81.0000 mg | CHEWABLE_TABLET | Freq: Two times a day (BID) | ORAL | Status: DC
  Administered 2019-11-22 – 2019-11-23 (×2): 81 mg via ORAL
  Filled 2019-11-22 (×2): qty 1

## 2019-11-22 MED ORDER — BUPIVACAINE LIPOSOME 1.3 % IJ SUSP
20.0000 mL | INTRAMUSCULAR | Status: AC
  Administered 2019-11-22: 20 mL
  Filled 2019-11-22: qty 20

## 2019-11-22 MED ORDER — TOPIRAMATE 25 MG PO TABS
50.0000 mg | ORAL_TABLET | Freq: Every day | ORAL | Status: DC
  Administered 2019-11-23: 50 mg via ORAL
  Filled 2019-11-22: qty 2

## 2019-11-22 MED ORDER — CALCIUM CARB-CHOLECALCIFEROL 600-800 MG-UNIT PO TABS: 1.0000 | ORAL_TABLET | Freq: Two times a day (BID) | ORAL | Status: DC

## 2019-11-22 MED ORDER — KETOROLAC TROMETHAMINE 15 MG/ML IJ SOLN
30.0000 mg | Freq: Four times a day (QID) | INTRAMUSCULAR | Status: DC | PRN
  Administered 2019-11-23: 30 mg via INTRAVENOUS
  Filled 2019-11-22: qty 2

## 2019-11-22 MED ORDER — HYDROMORPHONE HCL 1 MG/ML IJ SOLN
0.5000 mg | INTRAMUSCULAR | Status: DC | PRN
  Administered 2019-11-22: 1 mg via INTRAVENOUS
  Filled 2019-11-22: qty 1

## 2019-11-22 MED ORDER — SODIUM CHLORIDE 0.9 % IR SOLN
Status: DC | PRN
  Administered 2019-11-22: 3000 mL

## 2019-11-22 MED ORDER — METOCLOPRAMIDE HCL 5 MG/ML IJ SOLN: 5.0000 mg | Freq: Three times a day (TID) | INTRAMUSCULAR | Status: DC | PRN

## 2019-11-22 MED ORDER — BUPIVACAINE HCL (PF) 0.25 % IJ SOLN
INTRAMUSCULAR | Status: AC
  Filled 2019-11-22: qty 30

## 2019-11-22 MED ORDER — VANCOMYCIN HCL 1000 MG IV SOLR
INTRAVENOUS | Status: AC
  Filled 2019-11-22: qty 1000

## 2019-11-22 MED ORDER — EPINEPHRINE PF 1 MG/ML IJ SOLN
INTRAMUSCULAR | Status: AC
  Filled 2019-11-22: qty 1

## 2019-11-22 MED ORDER — ACETAMINOPHEN 10 MG/ML IV SOLN: 1000.0000 mg | Freq: Once | INTRAVENOUS | Status: DC | PRN

## 2019-11-22 MED ORDER — ACETAMINOPHEN 325 MG PO TABS: 325.0000 mg | ORAL_TABLET | Freq: Four times a day (QID) | ORAL | Status: DC | PRN

## 2019-11-22 MED ORDER — OXYCODONE HCL 5 MG PO TABS: 10.0000 mg | ORAL_TABLET | ORAL | Status: DC | PRN

## 2019-11-22 MED ORDER — ALPRAZOLAM 0.5 MG PO TABS: 0.5000 mg | ORAL_TABLET | Freq: Three times a day (TID) | ORAL | Status: DC | PRN

## 2019-11-22 MED ORDER — ALUM & MAG HYDROXIDE-SIMETH 200-200-20 MG/5ML PO SUSP: 30.0000 mL | ORAL | Status: DC | PRN

## 2019-11-22 MED ORDER — GABAPENTIN 300 MG PO CAPS: 300.0000 mg | ORAL_CAPSULE | Freq: Three times a day (TID) | ORAL | Status: DC

## 2019-11-22 MED ORDER — EPINEPHRINE PF 1 MG/ML IJ SOLN
INTRAMUSCULAR | Status: DC | PRN
  Administered 2019-11-22: .15 mL

## 2019-11-22 MED ORDER — SODIUM CHLORIDE 0.9 % IV SOLN: INTRAVENOUS | Status: DC

## 2019-11-22 MED ORDER — FLUTICASONE PROPIONATE 50 MCG/ACT NA SUSP
2.0000 | Freq: Every day | NASAL | Status: DC | PRN
  Administered 2019-11-22: 2 via NASAL
  Filled 2019-11-22: qty 16

## 2019-11-22 MED ORDER — VANCOMYCIN HCL 1 G IV SOLR
INTRAVENOUS | Status: DC | PRN
  Administered 2019-11-22: 1000 mg via TOPICAL

## 2019-11-22 MED ORDER — CALCIUM CARBONATE-VITAMIN D 500-200 MG-UNIT PO TABS
1.0000 | ORAL_TABLET | Freq: Two times a day (BID) | ORAL | Status: DC
  Administered 2019-11-22 – 2019-11-23 (×2): 1 via ORAL
  Filled 2019-11-22 (×2): qty 1

## 2019-11-22 MED ORDER — BUPIVACAINE HCL (PF) 0.25 % IJ SOLN
INTRAMUSCULAR | Status: DC | PRN
  Administered 2019-11-22: 20 mL

## 2019-11-22 MED ORDER — FENTANYL CITRATE (PF) 250 MCG/5ML IJ SOLN
INTRAMUSCULAR | Status: AC
  Filled 2019-11-22: qty 5

## 2019-11-22 MED ORDER — PHENOL 1.4 % MT LIQD: 1.0000 | OROMUCOSAL | Status: DC | PRN

## 2019-11-22 MED ORDER — MIDAZOLAM HCL 5 MG/5ML IJ SOLN
INTRAMUSCULAR | Status: DC | PRN
  Administered 2019-11-22: 2 mg via INTRAVENOUS

## 2019-11-22 MED ORDER — METHOCARBAMOL 750 MG PO TABS
750.0000 mg | ORAL_TABLET | Freq: Four times a day (QID) | ORAL | Status: DC | PRN
  Administered 2019-11-22 – 2019-11-23 (×2): 750 mg via ORAL
  Filled 2019-11-22 (×2): qty 1

## 2019-11-22 MED ORDER — HYDROMORPHONE HCL 1 MG/ML IJ SOLN: 0.2500 mg | INTRAMUSCULAR | Status: DC | PRN

## 2019-11-22 MED ORDER — DOCUSATE SODIUM 100 MG PO CAPS
100.0000 mg | ORAL_CAPSULE | Freq: Two times a day (BID) | ORAL | Status: DC
  Administered 2019-11-22 – 2019-11-23 (×2): 100 mg via ORAL
  Filled 2019-11-22 (×2): qty 1

## 2019-11-22 SURGICAL SUPPLY — 61 items
BAG DECANTER FOR FLEXI CONT (MISCELLANEOUS) ×3 IMPLANT
CELLS DAT CNTRL 66122 CELL SVR (MISCELLANEOUS) IMPLANT
COVER PERINEAL POST (MISCELLANEOUS) ×3 IMPLANT
COVER SURGICAL LIGHT HANDLE (MISCELLANEOUS) ×3 IMPLANT
COVER WAND RF STERILE (DRAPES) ×3 IMPLANT
CUP ACET PNNCL SECTR W/GRIP 56 (Hips) IMPLANT
DRAPE C-ARM 42X72 X-RAY (DRAPES) ×3 IMPLANT
DRAPE POUCH INSTRU U-SHP 10X18 (DRAPES) ×3 IMPLANT
DRAPE STERI IOBAN 125X83 (DRAPES) ×3 IMPLANT
DRAPE U-SHAPE 47X51 STRL (DRAPES) ×6 IMPLANT
DRSG AQUACEL AG ADV 3.5X10 (GAUZE/BANDAGES/DRESSINGS) ×3 IMPLANT
DURAPREP 26ML APPLICATOR (WOUND CARE) ×6 IMPLANT
ELECT BLADE 4.0 EZ CLEAN MEGAD (MISCELLANEOUS) ×3
ELECT REM PT RETURN 9FT ADLT (ELECTROSURGICAL) ×3
ELECTRODE BLDE 4.0 EZ CLN MEGD (MISCELLANEOUS) ×2 IMPLANT
ELECTRODE REM PT RTRN 9FT ADLT (ELECTROSURGICAL) ×2 IMPLANT
GLOVE BIOGEL PI IND STRL 7.0 (GLOVE) ×2 IMPLANT
GLOVE BIOGEL PI INDICATOR 7.0 (GLOVE) ×1
GLOVE ECLIPSE 7.0 STRL STRAW (GLOVE) ×6 IMPLANT
GLOVE SKINSENSE NS SZ7.5 (GLOVE) ×1
GLOVE SKINSENSE STRL SZ7.5 (GLOVE) ×2 IMPLANT
GLOVE SURG SYN 7.5  E (GLOVE) ×12
GLOVE SURG SYN 7.5 E (GLOVE) ×8 IMPLANT
GLOVE SURG SYN 7.5 PF PI (GLOVE) ×8 IMPLANT
GOWN STRL REIN XL XLG (GOWN DISPOSABLE) ×3 IMPLANT
GOWN STRL REUS W/ TWL LRG LVL3 (GOWN DISPOSABLE) IMPLANT
GOWN STRL REUS W/ TWL XL LVL3 (GOWN DISPOSABLE) ×2 IMPLANT
GOWN STRL REUS W/TWL LRG LVL3 (GOWN DISPOSABLE)
GOWN STRL REUS W/TWL XL LVL3 (GOWN DISPOSABLE) ×3
HANDPIECE INTERPULSE COAX TIP (DISPOSABLE) ×3
HEAD CERAMIC DELTA 36 PLUS 1.5 (Hips) ×1 IMPLANT
HOOD PEEL AWAY FLYTE STAYCOOL (MISCELLANEOUS) ×6 IMPLANT
IV NS IRRIG 3000ML ARTHROMATIC (IV SOLUTION) ×3 IMPLANT
KIT BASIN OR (CUSTOM PROCEDURE TRAY) ×3 IMPLANT
MARKER SKIN DUAL TIP RULER LAB (MISCELLANEOUS) ×3 IMPLANT
NDL SPNL 18GX3.5 QUINCKE PK (NEEDLE) ×2 IMPLANT
NEEDLE SPNL 18GX3.5 QUINCKE PK (NEEDLE) ×3 IMPLANT
PACK TOTAL JOINT (CUSTOM PROCEDURE TRAY) ×3 IMPLANT
PACK UNIVERSAL I (CUSTOM PROCEDURE TRAY) ×3 IMPLANT
PINN SECTOR W/GRIP ACE CUP 56 (Hips) ×3 IMPLANT
PINNACLE ALTRX PLUS 4 N 36X56 (Hips) ×1 IMPLANT
RETRACTOR WND ALEXIS 18 MED (MISCELLANEOUS) IMPLANT
RTRCTR WOUND ALEXIS 18CM MED (MISCELLANEOUS)
SAW OSC TIP CART 19.5X105X1.3 (SAW) ×3 IMPLANT
SCREW 6.5MMX25MM (Screw) ×1 IMPLANT
SET HNDPC FAN SPRY TIP SCT (DISPOSABLE) ×2 IMPLANT
STAPLER VISISTAT 35W (STAPLE) IMPLANT
STEM FEM ACTIS STD SZ7 (Nail) ×1 IMPLANT
SUT ETHIBOND 2 V 37 (SUTURE) ×3 IMPLANT
SUT VIC AB 0 CT1 27 (SUTURE) ×3
SUT VIC AB 0 CT1 27XBRD ANBCTR (SUTURE) ×2 IMPLANT
SUT VIC AB 1 CTX 36 (SUTURE) ×3
SUT VIC AB 1 CTX36XBRD ANBCTR (SUTURE) ×2 IMPLANT
SUT VIC AB 2-0 CT1 27 (SUTURE) ×6
SUT VIC AB 2-0 CT1 TAPERPNT 27 (SUTURE) ×4 IMPLANT
SYR 50ML LL SCALE MARK (SYRINGE) ×3 IMPLANT
TOWEL GREEN STERILE (TOWEL DISPOSABLE) ×3 IMPLANT
TRAY CATH 16FR W/PLASTIC CATH (SET/KITS/TRAYS/PACK) IMPLANT
TRAY FOLEY W/BAG SLVR 16FR (SET/KITS/TRAYS/PACK) ×3
TRAY FOLEY W/BAG SLVR 16FR ST (SET/KITS/TRAYS/PACK) ×2 IMPLANT
YANKAUER SUCT BULB TIP NO VENT (SUCTIONS) ×3 IMPLANT

## 2019-11-22 NOTE — Op Note (Signed)
Richard Glass   [id number]  Pre-op Diagnosis:  1. right femoral neck nonunion 2. painful hardware     Post-op Diagnosis: same   Operative Procedures  1. Conversion of previous right hip surgery to a right total hip replacement 2. Removal of three 6.5 mm cannulated screws through separate incision 3. Sharp excisional debridement of skin, subcutaneous tissue, fascia, bone 5 cm  Personnel  Surgeon(s): Leandrew Koyanagi, MD  Assist: Madalyn Rob, PA-C; necessary for the timely completion of procedure and due to complexity of procedure.   Anesthesia: spinal  Prosthesis: Depuy Acetabulum: Pinnacle 56 mm Femur: Actis 7 STD Head: 36 mm size: +1.5 Liner: +4 neutral Bearing Type: ceramic on poly  After informed consent was obtained and the operative extremity marked in the holding area, the patient was brought back to the operating room and placed supine on the HANA table. Next, the operative extremity was prepped and draped in normal sterile fashion. Surgical timeout occurred verifying patient identification, surgical site, surgical procedure and administration of antibiotics.  An incision was made over the previous lateral surgical scar from placement of the cannulated screws.  Dissection was carried down to the IT band which was then sharply incised.  Finger palpation was used to localize the screw heads.  Pin was inserted into each of the screws and confirmed under fluoroscopy and the screws were taken out without difficulty.  The screw tracks and the screw threads were cultured and stat Gram stain was negative for organisms.  Sharp excisional debridement of the skin, subcutaneous tissue, the lateral femoral cortex was performed using a rondure.  After this was done we then turned our attention to conversion to a total hip replacement.  A separate incision was made 2 fingerbreadths lateral to the ASIS towards the lateral border of the patella.  A modified anterior Smith-Peterson  approach to the hip was performed, using the interval between tensor fascia lata and sartorius.  Dissection was carried bluntly down onto the anterior hip capsule. The lateral femoral circumflex vessels were identified and coagulated. A capsulotomy was performed and the capsular flaps tagged for later repair.  The neck osteotomy was performed.  The inferior and posterior portion of the femoral neck demonstrated an atrophic nonunion.  The femoral head was removed, the acetabular rim was cleared of soft tissue and attention was turned to reaming the acetabulum.  Sequential reaming was performed under fluoroscopic guidance. We reamed to a size 55 mm, and then impacted the acetabular shell.  A single cancellous screw was placed through the cup for extra fixation.  The +4 liner was then placed after irrigation and attention turned to the femur.  After placing the femoral hook, the leg was taken to externally rotated, extended and adducted position taking care to perform soft tissue releases to allow for adequate mobilization of the femur. Soft tissue was cleared from the shoulder of the greater trochanter and the hook elevator used to improve exposure of the proximal femur. Sequential broaching performed up to a size 7. Trial neck and head were placed. The leg was brought back up to neutral and the construct reduced. The position and sizing of components, offset and leg lengths were checked using fluoroscopy. Stability of the construct was checked in extension and external rotation without any subluxation or impingement of prosthesis. We dislocated the prosthesis, dropped the leg back into position, removed trial components, and irrigated copiously. The final stem and head was then placed, the leg brought back up, the  system reduced and fluoroscopy used to verify positioning.  We irrigated, obtained hemostasis and closed the capsule using #2 ethibond suture.  One gram of vancomycin powder was placed in the surgical  bed.  The 2 incisions were closed separately.  The fascia was closed with #1 vicryl plus, the deep fat layer was closed with 0 vicryl, the subcutaneous layers closed with 2.0 Vicryl Plus and the skin closed with 2.0 nylon and dermabond. A sterile dressing was applied. The patient was awakened in the operating room and taken to recovery in stable condition.  All sponge, needle, and instrument counts were correct at the end of the case.   Position: supine  Complications: see description of procedure.  Time Out: performed   Drains/Packing: none  Estimated blood loss: see anesthesia record  Returned to Recovery Room: in good condition.   Antibiotics: yes   Mechanical VTE (DVT) Prophylaxis: sequential compression devices, TED thigh-high  Chemical VTE (DVT) Prophylaxis: aspirin   Fluid Replacement: see anesthesia record  Specimens Removed: 1 to pathology   Sponge and Instrument Count Correct? yes   PACU: portable radiograph - low AP   Plan/RTC: Return in 2 weeks for staple removal. Weight Bearing/Load Lower Extremity: full  Hip precautions: none Suture Removal: 2 weeks   N. Eduard Roux, MD [facility] 8:48 AM   Implant Name Type Inv. Item Serial No. Manufacturer Lot No. LRB No. Used Action  PINN SECTOR W/GRIP ACE CUP 56 [id number] Hips PINN SECTOR W/GRIP ACE CUP 56  DEPUY SYN[employer] [id number] Right 1 Implanted  SCREW 6.5MMX25MM - [id number] Screw SCREW 6.5MMX25MM  DEPUY SYNTHES [id number] Right 1 Implanted  PINNACLE ALTRX PLUS 4 N 36X56 - [id number] Hips PINNACLE ALTRX PLUS 4 N 36X56  DEPUY SYNTHES [id number] Right 1 Implanted  HEAD CERAMIC DELTA 36 PLUS 1.5 - [id number] Hips HEAD CERAMIC DELTA 36 PLUS 1.5  DEPUY SYNTHES [id number] Right 1 Implanted  STEM FEM ACTIS STD SZ7 - [id number] Nail STEM FEM ACTIS STD SZ7  DEPU[employer] ORTH[employer] [id number] Right 1 Implanted

## 2019-11-22 NOTE — Anesthesia Procedure Notes (Signed)
Spinal  Patient location during procedure: OR Start time: 11/22/2019 7:18 AM End time: 11/22/2019 7:23 AM Staffing Anesthesiologist: Barnet Glasgow, MD Preanesthetic Checklist Completed: patient identified, IV checked, site marked, risks and benefits discussed, surgical consent, monitors and equipment checked, pre-op evaluation and timeout performed Spinal Block Patient position: sitting Prep: DuraPrep Patient monitoring: heart rate, cardiac monitor, continuous pulse ox and blood pressure Approach: midline Location: L3-4 Injection technique: single-shot Needle Needle type: Sprotte  Needle gauge: 24 G Needle length: 9 cm Needle insertion depth: 6 cm Assessment Sensory level: T4 Additional Notes 1 attempt the patient tolerated procedure well.

## 2019-11-22 NOTE — Progress Notes (Signed)
Notified on call  [facility] PA about incident. NS bolus 250 ml done per order. CBG 117. Patient cont to be A/O x4 however  Pale looking. No further orders noted. Will cont to monitor closely.

## 2019-11-22 NOTE — Transfer of Care (Signed)
Immediate Anesthesia Transfer of Care Note  Patient: Richard Glass  Procedure(s) Performed: CONVERSION OF PREVIOUS RIGHT HIP SURGERY TO TOTAL HIP ARTHROPLASTY ANTERIOR APPROACH, HARDWARE REMOVAL (Right Hip) HARDWARE REMOVAL (Right )  Patient Location: PACU  Anesthesia Type:MAC and Spinal  Level of Consciousness: awake, alert  and oriented  Airway & Oxygen Therapy: Patient Spontanous Breathing  Post-op Assessment: Report given to RN and Post -op Vital signs reviewed and stable  Post vital signs: Reviewed and stable  Last Vitals:  Vitals Value Taken Time  BP    Temp    Pulse 48 11/22/19 0930  Resp 11 11/22/19 0930  SpO2 100 % 11/22/19 0930  Vitals shown include unvalidated device data.  Last Pain:  Vitals:   11/22/19 0622  TempSrc:   PainSc: 7       Patients Stated Pain Goal: 4 (00/86/76 1950)  Complications: No apparent anesthesia complications

## 2019-11-22 NOTE — Progress Notes (Signed)
Patient had syncopal episode lasted about 1-2 minutes while using the BR to urinate.  BP dropped to 90/32 during the period of unresponsiveness, went up to 100/63 when patient came out from syncope. Patient alert and oriented x4, O2 started at 2 L /min. IVF maintained,  PERRLA, will monitor closely. Will notify MD.

## 2019-11-22 NOTE — Anesthesia Postprocedure Evaluation (Signed)
Anesthesia Post Note  Patient: BIRD LAMARQUE  Procedure(s) Performed: CONVERSION OF PREVIOUS RIGHT HIP SURGERY TO TOTAL HIP ARTHROPLASTY ANTERIOR APPROACH, HARDWARE REMOVAL (Right Hip) HARDWARE REMOVAL (Right )     Patient location during evaluation: Nursing Unit Anesthesia Type: Spinal Level of consciousness: oriented and awake and alert Pain management: pain level controlled Vital Signs Assessment: post-procedure vital signs reviewed and stable Respiratory status: spontaneous breathing and respiratory function stable Cardiovascular status: blood pressure returned to baseline and stable Postop Assessment: no headache, no backache, no apparent nausea or vomiting and patient able to bend at knees Anesthetic complications: no    Last Vitals:  Vitals:   11/22/19 1030 11/22/19 1100  BP: 97/70   Pulse: 64 (!) 43  Resp: 15 11  Temp:    SpO2: 100% 100%    Last Pain:  Vitals:   11/22/19 1030  TempSrc:   PainSc: 0-No pain                 Barnet Glasgow

## 2019-11-22 NOTE — Anesthesia Procedure Notes (Signed)
Procedure Name: MAC Date/Time: 11/22/2019 7:23 AM Performed by: Griffin Dakin, CRNA Pre-anesthesia Checklist: Patient identified, Emergency Drugs available, Suction available, Patient being monitored and Timeout performed Patient Re-evaluated:Patient Re-evaluated prior to induction Oxygen Delivery Method: Simple face mask Induction Type: IV induction Placement Confirmation: positive ETCO2 Dental Injury: Teeth and Oropharynx as per pre-operative assessment

## 2019-11-22 NOTE — H&P (Signed)
PREOPERATIVE H&P  Chief Complaint: right femoral neck nonunion, painful hardware  HPI: Richard Glass is a 49 y.o. male who presents for surgical treatment of right femoral neck nonunion, painful hardware.  He denies any changes in medical history.  Past Medical History:  Diagnosis Date  . 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  . Allergic rhinitis 11/13/2006       . Anxiety   . Anxiety state 08/08/2013  . Asthma   . ASTHMA, INTERMITTENT 07/08/2010   Qualifier: Diagnosis of  By: Walker Kehr MD, Patrick Jupiter    . Back pain of lumbar region with sciatica 11/13/2006   MRI 12/2014 - lumbar DDD without focal neural impingement  MRI Lumbar 10/30/16 ([facility]) Small central L5-S1 disc extrusion with minimal cranial migration and associated annular fissure approaches descending left S1 nerve roots without contact or displacement. Minimal bulging disc height loss without spinal canal stenosis or neural foraminal narrowing.  Minimal bulging disc at L2-L3 through L4-L5 without spinal canal stenosis or neural foraminal narrowing.  MRI Sacrum 10/30/16 ([facility]) No fracture. Small lesion left sacral ala most likely a subchondral cyst/erosion adjacent to the sacroiliac joint.     . 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 Carpal tunnel syndrome 01/09/2015   Left 12/2014   . Cervical spine pain 02/06/2015   MRI 03/2015 FINDINGS: Vertebral body height, signal and alignment are normal. The craniocervical junction is normal and cervical cord signal is normal. The central spinal canal and neural foramina are widely patent at all levels. Scattered, mild degenerative change appears most notable at C4-5. Imaged paraspinous structures are unremarkable.  IMPRESSION: Negative for central canal or foraminal narrowing. No finding to explain the patient's symptoms. Scattered, mild facet degenerative disease is  noted.   . Chronic low back pain   . Chronic pain 01/18/2016  . Chronic pain syndrome 01/18/2016  . Coccyx pain 02/06/2015  . Contact dermatitis or eczema 02/13/2018  . Convulsions ([place]) 11/13/2006     Cystic encephalomalacia left temporal and parietal regions from remote injury.   . Degenerative arthritis   . Dyslipidemia   . External hemorrhoid, bleeding 06/04/2011  . GASTROESOPHAGEAL REFLUX, NO ESOPHAGITIS 11/13/2006   Qualifier: Diagnosis of  By: Eusebio Friendly    . Hamstring muscle strain, left, initial encounter 06/26/2018  . Headache(784.0)   . Hyperlipidemia 11/13/2006   07/2016  ASCVD score of 4.7% - recommended lifestyle changes   . Insomnia 02/02/2013  . Intention tremor 10/31/2009   Qualifier: Diagnosis of  By: Walker Kehr MD, Patrick Jupiter    . Intercostal pain 04/25/2015  . Left foot pain 08/21/2018  . Left knee injury 05/09/2016  . 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 Morton's neuroma of left foot 01/18/2016  . Osteopenia 10/10/2016  . Overweight(278.02) 06/13/2009   Qualifier: Diagnosis of  By: Hedy Camara    . Pain in joint, shoulder region 07/05/2014   LEFT > Right Reports hx rotator cuff surgery on rigt shoulder previously (Dr Nicholes Stairs) but no notes available XRAY right shoulder showed some proliferative changes distal rigt clavicle   . Rash and nonspecific skin eruption 10/10/2016  . Restless leg 09/29/2007   Qualifier: Diagnosis of  By: Walker Kehr MD, Patrick Jupiter    . Restless legs syndrome (RLS)   . Sciatica of left side 10/26/2014  . Seizures ([place])    last >56yrs  . SI (sacroiliac) pain 03/20/2017  . Sinusitis  chronic, frontal   . Status post lumbar spinal fusion 03/20/2017  . Subacromial or subdeltoid bursitis 08/08/2009   MRI C-spine 2011([facility]) - normal MRI left shoulder 10/2010 ([facility]) - AC degenerative disease, normal rotator cuff 12/2012 -debridement, acromioplasty and distal clavicle excision of left shoulder, arthroscopy also performed an no need for rotator cuff repair -  performed by [facility] 02/2013 - Nerve Conduction Studies ([facility]) - ulnar nerve compression 02/2013- taken back to OR for Ulnar nerve decompression 06/2013 Repeat MRI left shoulder - subacromial/subdeltoid bursitis, a small intersubstance tear to the distal infraspinatus and evidence of interval resection of the distal clavicle 06/2013 - steroid injection of left biceps 09/2013 -Repeat EMG showed improvement of ulnar nerve compression 10/2013 - referred to [facility] for second opinion due to intractable pain 11/2013 - Seen by Dr. Marlou Sa at [facility]; discussed risks/benefits of surgical intervention and bursectomy, no plan for surgery at this time     . 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 [place]   struck by golf ball-49 yrs old  . CRANIOTOMY     metal plate  . HIP PINNING,CANNULATED Right 06/27/2019   Procedure: HIP PERCUTANEOUS PINNING;  Surgeon: Meredith Pel, MD;  Location: [facility];  Service: Orthopedics;  Laterality: Right;  . NASAL FRACTURE SURGERY  1997  . SACROILIAC JOINT FUSION Left 03/20/2017   Procedure: SACROILIAC JOINT FUSION;  Surgeon: Melina Schools, MD;  Location: [facility];  Service: Orthopedics;  Laterality: Left;  90 mins  . SHOULDER ARTHROSCOPY Left    "cleaned arthritis out"  . SHOULDER SURGERY Right 2012   Rotator cuff surgery  . TONSILLECTOMY    . UVULOPALATOPHARYNGOPLASTY (UPPP)/TONSILLECTOMY/SEPTOPLASTY  2001   Social History   Socioeconomic History  . Marital status: Single    Spouse name: Not on file  . Number of children: 0  . Years of education: 74  . Highest education level: Not on file  Occupational History  . Occupation: disabled  Tobacco Use  . Smoking status: Former Smoker    Packs/day: 1.00    Years: 22.00    Pack years: 22.00    Types: Cigarettes    Quit date:  06/22/2012    Years since quitting: 7.4  . Smokeless tobacco: Current User    Types: Snuff  . Tobacco comment: per patient uses "dip" about twice a day   Substance and Sexual Activity  . Alcohol use: No    Alcohol/week: 0.0 standard drinks    Comment: none since 7/11  . Drug use: No  . Sexual activity: Never  Other Topics Concern  . 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 Difficulty of Paying Living Expenses: Not on file  Food Insecurity:   . Worried About Charity fundraiser in the Last Year: Not on file  . Ran Out of Food in the Last Year: Not on file  Transportation Needs: No Transportation Needs  . Lack of Transportation (Medical): No  . Lack of Transportation (Non-Medical): No  Physical Activity:   . Days  of Exercise per Week: Not on file  . Minutes of Exercise per Session: Not on file  Stress:   . Feeling of Stress : Not on file  Social Connections:   . Frequency of Communication with Friends and Family: Not on file  . Frequency of Social Gatherings with Friends and Family: Not on file  . Attends Religious Services: Not on file  . Active Member of Clubs or Organizations: Not on file  . Attends Archivist Meetings: Not on file  . Marital Status: Not on file   Family History  Problem Relation Age of Onset  . Cancer Father        lung  . Heart disease Father   . Hypertension Mother   . Seizures Neg Hx    Allergies  Allergen Reactions  . Ibuprofen Diarrhea    Per patient also burns stomach   . No Known Allergies    Prior to Admission medications   Medication Sig Start Date End Date Taking? Authorizing Provider  acetaminophen (TYLENOL) 500 MG tablet Take 1,000 mg by mouth every 6 (six) hours as needed for moderate  pain.   Yes [provider]  ALPRAZolam (XANAX) 1 MG tablet TAKE 1/2 TABLET BY MOUTH THREE TIMES DAILY AS NEEDED. Patient taking differently: Take 0.5 mg by mouth 3 (three) times daily as needed for anxiety.  10/04/19  Yes Kathrynn Ducking, MD  amitriptyline (ELAVIL) 10 MG tablet Take 1 tablet (10 mg total) by mouth at bedtime. 11/11/19  Yes [name], [name], MD  baclofen (LIORESAL) 10 MG tablet TAKE 1/2 TABLET BY MOUTH 3 TIMES DAILY Patient taking differently: Take 5 mg by mouth 3 (three) times daily.  10/04/19  Yes [name], [name], MD  [employer] CALCIUM 600+D 600-800 MG-UNIT TABS TAKE 1 TABLET BY MOUTH TWICE A DAY BEFORE A MEAL Patient taking differently: Take 1 tablet by mouth 2 (two) times daily before a meal.  03/12/19  Yes [name], [name], MD  fluticasone (FLONASE) 50 MCG/ACT nasal spray PLACE 2 SPRAYS IN EACH NOSTRIL EVERY DAY Patient taking differently: Place 2 sprays into both nostrils daily as needed for allergies.  05/04/19  Yes [name], [name], MD  gabapentin (NEURONTIN) 600 MG tablet Take 2 tablets (1,200 mg total) by mouth 3 (three) times daily. 04/06/19  Yes [name], [name], MD  HYDROcodone-acetaminophen (NORCO) 7.5-325 MG tablet Take 1 tablet by mouth every 6 (six) hours as needed for moderate pain. 10/29/19 11/28/19 Yes [name], [name], MD  ketorolac (TORADOL) 10 MG tablet Take 1 tablet (10 mg total) by mouth 2 (two) times daily as needed. 11/21/19  Yes Leandrew Koyanagi, MD  meloxicam (MOBIC) 15 MG tablet Take 1 tablet (15 mg total) by mouth daily. 11/11/19  Yes [name], [name], MD  methocarbamol (ROBAXIN) 750 MG tablet Take 1 tablet (750 mg total) by mouth 2 (two) times daily as needed for muscle spasms. 11/21/19  Yes Leandrew Koyanagi, MD  Multiple Vitamin (MULTIVITAMIN WITH MINERALS) TABS tablet Take 1 tablet by mouth daily.   Yes [provider]  PHENobarbital (LUMINAL) 64.8 MG tablet TAKE 3 TABLETS BY MOUTH AT BEDTIME Patient taking differently: Take 194.4 mg by mouth at  bedtime.  10/21/19  Yes Kathrynn Ducking, MD  topiramate (TOPAMAX) 50 MG tablet TAKE 1 TABLET BY MOUTH EVERY MORNING AND 2 TABLETS EVERY EVENING Patient taking differently: Take 50-100 mg by mouth See admin instructions. Take 50 mg by mouth in the morning and 100 mg  in the evening 10/07/19  Yes Ward Givens, NP  aspirin EC 81 MG tablet Take 1 tablet (81 mg total) by mouth 2 (two) times daily. 11/21/19   Leandrew Koyanagi, MD  carbamide peroxide (DEBROX) 6.5 % OTIC solution Place 5 drops into the right ear 2 (two) times daily. Patient not taking: Reported on 11/12/2019 10/22/19   Matilde Haymaker, MD  HYDROcodone-acetaminophen [facility]) 7.5-325 MG tablet Take 1 tablet by mouth every 6 (six) hours as needed for moderate pain. 11/11/19   [name], [name], MD  methocarbamol (ROBAXIN) 500 MG tablet Take 1 tablet (500 mg total) by mouth every 8 (eight) hours as needed for muscle spasms. Patient not taking: Reported on 11/12/2019 09/22/19   Meredith Pel, MD  ondansetron (ZOFRAN) 4 MG tablet Take 1-2 tablets (4-8 mg total) by mouth every 8 (eight) hours as needed for nausea or vomiting. 11/21/19   Leandrew Koyanagi, MD  oxyCODONE-acetaminophen (PERCOCET) 5-325 MG tablet Take 1-2 tablets by mouth every 8 (eight) hours as needed for severe pain. 11/21/19   Leandrew Koyanagi, MD  polyethylene glycol (MIRALAX / GLYCOLAX) 17 g packet Take 17 g by mouth daily. Patient not taking: Reported on 11/12/2019 06/29/19   Carollee Leitz, MD  potassium chloride SA (KLOR-CON) 20 MEQ tablet Take 1 tablet (20 mEq total) by mouth 2 (two) times daily. Patient not taking: Reported on 11/12/2019 08/31/19   Nat Christen, MD  sulfamethoxazole-trimethoprim (BACTRIM DS) 800-160 MG tablet Take 1 tablet by mouth 2 (two) times daily. 11/21/19   Leandrew Koyanagi, MD     Positive ROS: All other systems have been reviewed and were otherwise negative with the exception of those mentioned in the HPI and as above.  Physical Exam: General: Alert, no acute distress  Cardiovascular: No pedal edema Respiratory: No cyanosis, no use of accessory musculature GI: abdomen soft Skin: No lesions in the area of chief complaint Neurologic: Sensation intact distally Psychiatric: Patient is competent for consent with normal mood and affect Lymphatic: no lymphedema  MUSCULOSKELETAL: exam stable  Assessment: right femoral neck nonunion, painful hardware  Plan: Plan for Procedure(s): CONVERSION OF PREVIOUS RIGHT HIP SURGERY TO TOTAL HIP ARTHROPLASTY ANTERIOR APPROACH, HARDWARE REMOVAL HARDWARE REMOVAL  The risks benefits and alternatives were discussed with the patient including but not limited to the risks of nonoperative treatment, versus surgical intervention including infection, bleeding, nerve injury,  blood clots, cardiopulmonary complications, morbidity, mortality, among others, and they were willing to proceed.   Preoperative templating of the joint replacement has been completed, documented, and submitted to the Operating Room personnel in order to optimize intra-operative equipment management.  Eduard Roux, MD   11/22/2019 7:07 AM

## 2019-11-22 NOTE — Evaluation (Signed)
Physical Therapy Evaluation Patient Details Name: Richard Glass MRN: [id number] DOB: 12-04-1970 Today's Date: 11/22/2019   History of Present Illness  Pt is a 49 y/o male s/p R THA following R femoral neck nonunion. PMH includes asthma, anxiety, and seizures.   Clinical Impression  Pt is s/p surgery above with deficits below. Pt requiring min A secondary to increased shakiness to transfer to chair. Pt not putting any weight on RLE despite cues secondary to pain. Feel pt will progress well once pain controlled. Will continue to follow acutely to maximize functional mobility independence and safety.     Follow Up Recommendations Follow surgeon's recommendation for DC plan and follow-up therapies;Supervision for mobility/OOB    Equipment Recommendations  None recommended by PT    Recommendations for Other Services       Precautions / Restrictions Precautions Precautions: Fall Restrictions Weight Bearing Restrictions: Yes RLE Weight Bearing: Weight bearing as tolerated      Mobility  Bed Mobility Overal bed mobility: Needs Assistance Bed Mobility: Supine to Sit     Supine to sit: Min guard     General bed mobility comments: Min guard for safety as pt very shaky upon sitting. Pt just reports he is cold.   Transfers Overall transfer level: Needs assistance Equipment used: Rolling walker (2 wheeled) Transfers: Sit to/from [facility] Sit to Stand: Min assist Stand pivot transfers: Min assist       General transfer comment: Min A for steadying assist, as pt very shaky. Cues to put weight on RLE, however, pt would not put weight on it secondary to pain. Mobility limited to chair secondary to pain.   Ambulation/Gait                Stairs            Wheelchair Mobility    Modified Rankin (Stroke Patients Only)       Balance Overall balance assessment: Needs assistance Sitting-balance support: No upper extremity supported;Feet  supported Sitting balance-Leahy Scale: Fair     Standing balance support: Bilateral upper extremity supported;During functional activity Standing balance-Leahy Scale: Poor Standing balance comment: Reliant on BUE support                              Pertinent Vitals/Pain Pain Assessment: Faces Faces Pain Scale: Hurts whole lot Pain Location: R hip Pain Descriptors / Indicators: Aching;Operative site guarding;Grimacing Pain Intervention(s): Limited activity within patient's tolerance;Monitored during session;Repositioned    Home Living Family/patient expects to be discharged to:: Private residence Living Arrangements: Alone Available Help at Discharge: Friend(s);Available 24 hours/day Type of Home: Mobile home Home Access: Ramped entrance     Home Layout: One level Home Equipment: [place] - 2 wheels;Walker - standard;Cane - quad;Wheelchair - manual;Crutches;Bedside commode      Prior Function Level of Independence: Independent with assistive device(s)         Comments: Had been using cane     Hand Dominance        Extremity/Trunk Assessment   Upper Extremity Assessment Upper Extremity Assessment: Overall WFL for tasks assessed    Lower Extremity Assessment Lower Extremity Assessment: RLE deficits/detail RLE Deficits / Details: Deficits consistent with post op pain and weakness.     Cervical / Trunk Assessment Cervical / Trunk Assessment: Normal  Communication   Communication: No difficulties  Cognition Arousal/Alertness: Awake/alert Behavior During Therapy: WFL for tasks assessed/performed Overall Cognitive Status: No family/caregiver present to  determine baseline cognitive functioning                                        General Comments      Exercises Total Joint Exercises Ankle Circles/Pumps: AROM;Both;10 reps;Seated Heel Slides: AROM;Right;5 reps   Assessment/Plan    PT Assessment Patient needs continued PT  services  PT Problem List Decreased strength;Decreased range of motion;Decreased activity tolerance;Decreased mobility;Decreased balance;Decreased knowledge of use of DME;Decreased knowledge of precautions;Decreased safety awareness;Pain       PT Treatment Interventions Gait training;DME instruction;Therapeutic activities;Functional mobility training;Balance training;Therapeutic exercise;Patient/family education    PT Goals (Current goals can be found in the Care Plan section)  Acute Rehab PT Goals Patient Stated Goal: to go home PT Goal Formulation: With patient Time For Goal Achievement: 12/06/19 Potential to Achieve Goals: Good    Frequency 7X/week   Barriers to discharge        Co-evaluation               AM-PAC PT "6 Clicks" Mobility  Outcome Measure Help needed turning from your back to your side while in a flat bed without using bedrails?: A Little Help needed moving from lying on your back to sitting on the side of a flat bed without using bedrails?: A Little Help needed moving to and from a bed to a chair (including a wheelchair)?: A Little Help needed standing up from a chair using your arms (e.g., wheelchair or bedside chair)?: A Little Help needed to walk in hospital room?: A Little Help needed climbing 3-5 steps with a railing? : A Lot 6 Click Score: 17    End of Session Equipment Utilized During Treatment: Gait belt Activity Tolerance: Patient limited by pain Patient left: in chair;with call bell/phone within reach Nurse Communication: Mobility status PT Visit Diagnosis: Unsteadiness on feet (R26.81);Muscle weakness (generalized) (M62.81);Pain Pain - Right/Left: Right Pain - part of body: Hip    Time: [id number] PT Time Calculation (min) (ACUTE ONLY): 21 min   Charges:   PT Evaluation $PT Eval Low Complexity: 1 Low          Lou Miner, DPT  Acute Rehabilitation Services  Pager: [phone] Office: [phone]   Rudean Hitt 11/22/2019, 5:41 PM

## 2019-11-23 DIAGNOSIS — Z87891 Personal history of nicotine dependence: Secondary | ICD-10-CM | POA: Diagnosis not present

## 2019-11-23 DIAGNOSIS — T8484XA Pain due to internal orthopedic prosthetic devices, implants and grafts, initial encounter: Secondary | ICD-10-CM | POA: Diagnosis not present

## 2019-11-23 DIAGNOSIS — Z79899 Other long term (current) drug therapy: Secondary | ICD-10-CM | POA: Diagnosis not present

## 2019-11-23 DIAGNOSIS — R569 Unspecified convulsions: Secondary | ICD-10-CM | POA: Diagnosis not present

## 2019-11-23 DIAGNOSIS — S72011K Unspecified intracapsular fracture of right femur, subsequent encounter for closed fracture with nonunion: Secondary | ICD-10-CM | POA: Diagnosis not present

## 2019-11-23 DIAGNOSIS — Z7982 Long term (current) use of aspirin: Secondary | ICD-10-CM | POA: Diagnosis not present

## 2019-11-23 DIAGNOSIS — M25551 Pain in right hip: Secondary | ICD-10-CM | POA: Diagnosis not present

## 2019-11-23 LAB — CBC
HCT: 30.3 % — ABNORMAL LOW (ref 39.0–52.0)
Hemoglobin: 9.8 g/dL — ABNORMAL LOW (ref 13.0–17.0)
MCH: 30.3 pg (ref 26.0–34.0)
MCHC: 32.3 g/dL (ref 30.0–36.0)
MCV: 93.8 fL (ref 80.0–100.0)
Platelets: 232 10*3/uL (ref 150–400)
RBC: 3.23 MIL/uL — ABNORMAL LOW (ref 4.22–5.81)
RDW: 13.2 % (ref 11.5–15.5)
WBC: 8.4 10*3/uL (ref 4.0–10.5)
nRBC: 0 % (ref 0.0–0.2)

## 2019-11-23 LAB — BASIC METABOLIC PANEL
Anion gap: 10 (ref 5–15)
BUN: 9 mg/dL (ref 6–20)
CO2: 25 mmol/L (ref 22–32)
Calcium: 7.8 mg/dL — ABNORMAL LOW (ref 8.9–10.3)
Chloride: 105 mmol/L (ref 98–111)
Creatinine, Ser: 1.11 mg/dL (ref 0.61–1.24)
GFR calc Af Amer: >60 mL/min (ref >60)
GFR calc non Af Amer: >60 mL/min (ref >60)
Glucose, Bld: 121 mg/dL — ABNORMAL HIGH (ref 70–99)
Potassium: 3.5 mmol/L (ref 3.5–5.1)
Sodium: 140 mmol/L (ref 135–145)

## 2019-11-23 NOTE — Progress Notes (Signed)
Physical Therapy Treatment Patient Details Name: Richard Glass MRN: [id number] DOB: Nov 01, 1970 Today's Date: 11/23/2019    History of Present Illness Pt is a 49 y/o male s/p R THA following R femoral neck nonunion. PMH includes asthma, anxiety, and seizures.     PT Comments    Pt progressing well and was able to tolerate ambulation and R LE there ex. Pt encouraged to increase R LE wbing and decreased bilat UE dependency. PT to return to do gait training with [facility] later today prior to d/c home.    Follow Up Recommendations  Follow surgeon's recommendation for DC plan and follow-up therapies;Supervision for mobility/OOB     Equipment Recommendations  None recommended by PT    Recommendations for Other Services       Precautions / Restrictions Precautions Precautions: Fall Restrictions Weight Bearing Restrictions: Yes RLE Weight Bearing: Weight bearing as tolerated    Mobility  Bed Mobility Overal bed mobility: Needs Assistance Bed Mobility: Supine to Sit     Supine to sit: Min guard     General bed mobility comments: max directional verbal cues, increased time, used long sit technique  Transfers Overall transfer level: Needs assistance Equipment used: Rolling walker (2 wheeled) Transfers: Sit to/from Stand Sit to Stand: Min guard         General transfer comment: increased time, verbal cues for hand placement when pushing up and returning to sitting  Ambulation/Gait Ambulation/Gait assistance: Supervision Gait Distance (Feet): 300 Feet Assistive device: Rolling walker (2 wheeled) Gait Pattern/deviations: Step-through pattern;Decreased step length - right Gait velocity: slow Gait velocity interpretation: 1.31 - 2.62 ft/sec, indicative of limited community ambulator General Gait Details: encouraged less UE dependence and increased R LE WBing, verbal cues for upright posture   Stairs             Wheelchair Mobility    Modified Rankin (Stroke  Patients Only)       Balance Overall balance assessment: Needs assistance Sitting-balance support: No upper extremity supported;Feet supported Sitting balance-Leahy Scale: Fair     Standing balance support: Bilateral upper extremity supported;During functional activity Standing balance-Leahy Scale: Fair Standing balance comment: reliant on Bilat UE support for amb                            Cognition Arousal/Alertness: Awake/alert Behavior During Therapy: WFL for tasks assessed/performed Overall Cognitive Status: No family/caregiver present to determine baseline cognitive functioning                                 General Comments: pt very sarcastic and constantly making jokes      Exercises Total Joint Exercises Ankle Circles/Pumps: AROM;Both;10 reps;Seated Quad Sets: AROM;Right;10 reps;Supine Heel Slides: AROM;Right;10 reps;Supine Straight Leg Raises: AROM;Both;10 reps;Supine Long Arc Quad: AROM;Right;10 reps;Seated    General Comments General comments (skin integrity, edema, etc.): VSS      Pertinent Vitals/Pain Pain Assessment: 0-10 Pain Score: 5  Pain Location: R hip Pain Descriptors / Indicators: Aching;Operative site guarding;Grimacing Pain Intervention(s): Monitored during session    Home Living                      Prior Function            PT Goals (current goals can now be found in the care plan section) Progress towards PT goals: Progressing toward goals  Frequency    7X/week      PT Plan Current plan remains appropriate    Co-evaluation              AM-PAC PT "6 Clicks" Mobility   Outcome Measure  Help needed turning from your back to your side while in a flat bed without using bedrails?: None Help needed moving from lying on your back to sitting on the side of a flat bed without using bedrails?: None Help needed moving to and from a bed to a chair (including a wheelchair)?: A Little Help  needed standing up from a chair using your arms (e.g., wheelchair or bedside chair)?: A Little Help needed to walk in hospital room?: A Little Help needed climbing 3-5 steps with a railing? : A Little 6 Click Score: 20    End of Session Equipment Utilized During Treatment: Gait belt Activity Tolerance: Patient tolerated treatment well Patient left: in chair;with call bell/phone within reach Nurse Communication: Mobility status PT Visit Diagnosis: Unsteadiness on feet (R26.81);Muscle weakness (generalized) (M62.81);Pain Pain - Right/Left: Right Pain - part of body: Hip     Time: [id number] PT Time Calculation (min) (ACUTE ONLY): 26 min  Charges:  $Gait Training: 8-22 mins $Therapeutic Exercise: 8-22 mins                     Kittie Plater, PT, DPT Acute Rehabilitation Services Pager #: [phone] Office #: [phone]    Berline Lopes 11/23/2019, 11:01 AM

## 2019-11-23 NOTE — Progress Notes (Signed)
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 PMH includes asthma, anxiety, and seizures.     PT Comments    Pt able to amb with SPC however with decreased step length and over compensation to L lateral lean. Pt improved towards end of ambulation. Pt safe to d/c home with friend when MD clears pt medically.   Follow Up Recommendations  Follow surgeon's recommendation for DC plan and follow-up therapies;Supervision for mobility/OOB     Equipment Recommendations  None recommended by PT    Recommendations for Other Services       Precautions / Restrictions Precautions Precautions: Fall Restrictions Weight Bearing Restrictions: Yes RLE Weight Bearing: Weight bearing as tolerated    Mobility  Bed Mobility Overal bed mobility: Needs Assistance Bed Mobility: Supine to Sit     Supine to sit: Min guard     General bed mobility comments: pt up in chair  Transfers Overall transfer level: Needs assistance Equipment used: Straight cane Transfers: Sit to/from Stand Sit to Stand: Supervision         General transfer comment: increased time, good hand placement  Ambulation/Gait Ambulation/Gait assistance: Supervision Gait Distance (Feet): 300 Feet Assistive device: Straight cane Gait Pattern/deviations: Decreased stride length;Decreased step length - right Gait velocity: slow Gait velocity interpretation: 1.31 - 2.62 ft/sec, indicative of limited community ambulator General Gait Details: verbal cues to increase step length on R LE to maintain fluid gait pattern instead of step to gait pattern.   Stairs             Wheelchair Mobility    Modified Rankin (Stroke Patients Only)       Balance Overall balance assessment: Mild deficits observed, not formally tested Sitting-balance support: No  upper extremity supported;Feet supported Sitting balance-Leahy Scale: Fair     Standing balance support: Bilateral upper extremity supported;During functional activity Standing balance-Leahy Scale: Fair Standing balance comment: reliant on Bilat UE support for amb                            Cognition Arousal/Alertness: Awake/alert Behavior During Therapy: WFL for tasks assessed/performed Overall Cognitive Status: No family/caregiver present to determine baseline cognitive functioning                                 General Comments: pt very sarcastic and constantly making jokes      Exercises Total Joint Exercises Ankle Circles/Pumps: AROM;Both;10 reps;Seated Quad Sets: AROM;Right;10 reps;Supine Heel Slides: AROM;Right;10 reps;Supine Straight Leg Raises: AROM;Both;10 reps;Supine Long Arc Quad: AROM;Right;10 reps;Seated    General Comments General comments (skin integrity, edema, etc.): vss      Pertinent Vitals/Pain Pain Assessment: 0-10 Pain Score: 5  Pain Location: R hip Pain Descriptors / Indicators: Aching;Operative site guarding;Grimacing Pain Intervention(s): Monitored during session    Home Living                      Prior Function            PT Goals (current goals can now be found in the care plan section) Progress towards PT goals: Progressing toward goals    Frequency    7X/week      PT Plan Current plan remains  appropriate    Co-evaluation              AM-PAC PT "6 Clicks" Mobility   Outcome Measure  Help needed turning from your back to your side while in a flat bed without using bedrails?: None Help needed moving from lying on your back to sitting on the side of a flat bed without using bedrails?: None Help needed moving to and from a bed to a chair (including a wheelchair)?: None Help needed standing up from a chair using your arms (e.g., wheelchair or bedside chair)?: None Help needed to walk in  hospital room?: A Little Help needed climbing 3-5 steps with a railing? : A Little 6 Click Score: 22    End of Session Equipment Utilized During Treatment: Gait belt Activity Tolerance: Patient tolerated treatment well Patient left: in chair;with call bell/phone within reach Nurse Communication: Mobility status PT Visit Diagnosis: Unsteadiness on feet (R26.81);Muscle weakness (generalized) (M62.81);Pain Pain - Right/Left: Right Pain - part of body: Hip     Time: 1017-1029 PT Time Calculation (min) (ACUTE ONLY): 12 min  Charges:  $Gait Training: 8-22 mins $Therapeutic Exercise: 8-22 mins                     Kittie Plater, PT, DPT Acute Rehabilitation Services Pager #: [phone] Office #: [phone]    Berline Lopes 11/23/2019, 11:09 AM

## 2019-11-23 NOTE — Progress Notes (Signed)
   Subjective:  Patient reports pain as mild. Had orthostasis last night.  Received bolus. BPs have improved.   Objective:   VITALS:   Vitals:   11/23/19 0425 11/23/19 0456 11/23/19 0614 11/23/19 0714  BP: (!) 88/60 (!) 87/51 (!) 83/56 100/68  Pulse: 70 68 69 70  Resp: 18 18  18   Temp: 99 F (37.2 C) 98.4 F (36.9 C)  98.7 F (37.1 C)  TempSrc: Oral Oral  Oral  SpO2: 100% 95%  99%  Weight:      Height:        Neurologically intact Neurovascular intact Sensation intact distally Intact pulses distally Dorsiflexion/Plantar flexion intact Incision: dressing C/D/I and no drainage No cellulitis present Compartment soft   Lab Results  Component Value Date   WBC 8.4 11/23/2019   HGB 9.8 (L) 11/23/2019   HCT 30.3 (L) 11/23/2019   MCV 93.8 11/23/2019   PLT 232 11/23/2019     Assessment/Plan:  1 Day Post-Op   - Expected postop acute blood loss anemia - will monitor for symptoms - Up with PT/OT - discharge pending - DVT ppx - SCDs, ambulation, aspirin - WBAT operative extremity - Pain control - Discharge planning - home today after he clears PT   Eduard Roux 11/23/2019, 7:40 AM [phone]

## 2019-11-23 NOTE — Plan of Care (Signed)
Patient alert and oriented, mae's well, voiding adequate amount of urine, swallowing without difficulty, no c/o pain at time of discharge. Patient discharged home with family. Script and discharged instructions given to patient. Patient and family stated understanding of instructions given. Patient has an appointment with Dr. Xu 

## 2019-11-24 ENCOUNTER — Encounter: Payer: Self-pay | Admitting: *Deleted

## 2019-11-24 NOTE — Discharge Summary (Signed)
Patient ID: Richard Glass MRN: [id number] DOB/AGE: August 13, 1971 49 y.o.  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  . Allergic rhinitis 11/13/2006       . Anxiety   . Anxiety state 08/08/2013  . Asthma   . ASTHMA, INTERMITTENT 07/08/2010   Qualifier: Diagnosis of  By: Walker Kehr MD, Patrick Jupiter    . Back pain of lumbar region with sciatica 11/13/2006   MRI 12/2014 - lumbar DDD without focal neural impingement  MRI Lumbar 10/30/16 ([place]) Small central L5-S1 disc extrusion with minimal cranial migration and associated annular fissure approaches descending left S1 nerve roots without contact or displacement. Minimal bulging disc height loss without spinal canal stenosis or neural foraminal narrowing.  Minimal bulging disc at L2-L3 through L4-L5 without spinal canal stenosis or neural foraminal narrowing.  MRI Sacrum 10/30/16 ([place]) No fracture. Small lesion left sacral ala most likely a subchondral cyst/erosion adjacent to the sacroiliac joint.     . 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 Carpal tunnel syndrome 01/09/2015   Left 12/2014   . Cervical spine pain 02/06/2015   MRI 03/2015 FINDINGS: Vertebral body height, signal and alignment are normal. The craniocervical junction is normal and cervical cord signal is normal. The central spinal canal and neural foramina are widely patent at all levels. Scattered, mild degenerative change appears most notable at C4-5. Imaged paraspinous structures are unremarkable.  IMPRESSION: Negative  for central canal or foraminal narrowing. No finding to explain the patient's symptoms. Scattered, mild facet degenerative disease is noted.   . Chronic low back pain   . Chronic pain 01/18/2016  . Chronic pain syndrome 01/18/2016  . Coccyx pain 02/06/2015  . Contact dermatitis or eczema 02/13/2018  . Convulsions ([place]) 11/13/2006     Cystic encephalomalacia left temporal and parietal regions from remote injury.   . Degenerative arthritis   . Dyslipidemia   . External hemorrhoid, bleeding 06/04/2011  . GASTROESOPHAGEAL REFLUX, NO ESOPHAGITIS 11/13/2006   Qualifier: Diagnosis of  By: Eusebio Friendly    . Hamstring muscle strain, left, initial encounter 06/26/2018  . Headache(784.0)   . Hyperlipidemia 11/13/2006   07/2016  ASCVD score of 4.7% - recommended lifestyle changes   . Insomnia 02/02/2013  . Intention tremor 10/31/2009   Qualifier: Diagnosis of  By: Walker Kehr MD, Patrick Jupiter    . Intercostal pain 04/25/2015  . Left foot pain 08/21/2018  . Left knee injury 05/09/2016  . 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 Morton's neuroma of left foot 01/18/2016  . Osteopenia 10/10/2016  . Overweight(278.02) 06/13/2009   Qualifier: Diagnosis of  By: Hedy Camara    . Pain in joint, shoulder region 07/05/2014   LEFT > Right Reports hx rotator cuff surgery on rigt shoulder previously (Dr Nicholes Stairs) but no notes available XRAY right shoulder showed some proliferative changes distal rigt clavicle   . Rash and nonspecific skin eruption 10/10/2016  . Restless leg 09/29/2007   Qualifier: Diagnosis of  By: Walker Kehr MD, Patrick Jupiter    . Restless legs syndrome (RLS)   . Sciatica of left side  10/26/2014  . Seizures ([place])    last >79yrs  . SI (sacroiliac) pain 03/20/2017  . Sinusitis chronic, frontal   . Status post lumbar spinal fusion 03/20/2017  . Subacromial or subdeltoid bursitis 08/08/2009   MRI C-spine 2011([facility]) - normal MRI left shoulder 10/2010 ([facility]) - AC degenerative disease, normal rotator cuff 12/2012  -debridement, acromioplasty and distal clavicle excision of left shoulder, arthroscopy also performed an no need for rotator cuff repair - performed by [facility] 02/2013 - Nerve Conduction Studies ([facility]) - ulnar nerve compression 02/2013- taken back to OR for Ulnar nerve decompression 06/2013 Repeat MRI left shoulder - subacromial/subdeltoid bursitis, a small intersubstance tear to the distal infraspinatus and evidence of interval resection of the distal clavicle 06/2013 - steroid injection of left biceps 09/2013 -Repeat EMG showed improvement of ulnar nerve compression 10/2013 - referred to [facility] for second opinion due to intractable pain 11/2013 - Seen by Dr. Marlou Sa at [facility]; discussed risks/benefits of surgical intervention and bursectomy, no plan for surgery at this time     . 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    He was given perioperative antibiotics:  Anti-infectives (From admission, onward)   Start     Dose/Rate Route Frequency Ordered Stop   11/22/19 1400  ceFAZolin (ANCEF) IVPB 2g/100 mL premix     2 g 200 mL/hr over 30 Minutes Intravenous Every 6 hours 11/22/19 1332 11/23/19 0230   11/22/19 0824  vancomycin (VANCOCIN) powder  Status:  Discontinued       As needed 11/22/19 0824 11/22/19 0927   11/22/19 0615  ceFAZolin (ANCEF) IVPB 2g/100 mL premix     2 g 200 mL/hr over 30 Minutes Intravenous On call  to O.R. 11/22/19 0604 11/22/19 0730   11/22/19 [id number]  ceFAZolin (ANCEF) 2-4 GM/100ML-% IVPB    Note to Pharmacy: Tressia Miners Ch: cabinet override      11/22/19 [id number] 11/22/19 0733    .  He was given sequential compression devices, early ambulation, and appropriate chemoprophylaxis for DVT prophylaxis.  He benefited maximally from the hospital stay and there were no complications.    Recent vital signs:  Vitals:   11/23/19 0614 11/23/19 0714  BP: (!) 83/56 100/68  Pulse: 69 70  Resp:  18  Temp:  98.7 F (37.1 C)  SpO2:  99%    Recent laboratory studies:  Lab Results  Component Value Date   HGB 9.8 (L) 11/23/2019   HGB 12.1 (L) 11/18/2019   HGB 12.5 (L) 10/14/2019   Lab Results  Component Value Date   WBC 8.4 11/23/2019   PLT 232 11/23/2019   Lab Results  Component Value Date   INR 1.1 11/18/2019   Lab Results  Component Value Date   NA 140 11/23/2019   K 3.5 11/23/2019   CL 105 11/23/2019   CO2 25 11/23/2019   BUN 9 11/23/2019   CREATININE 1.11 11/23/2019   GLUCOSE 121 (H) 11/23/2019  Discharge Medications:   Allergies as of 11/23/2019      Reactions   Ibuprofen Diarrhea   Per patient also burns stomach   No Known Allergies       Medication List    TAKE these medications   acetaminophen 500 MG tablet Commonly known as: TYLENOL Take 1,000 mg by mouth every 6 (six) hours as needed for moderate pain.   ALPRAZolam 1 MG tablet Commonly known as: XANAX TAKE 1/2 TABLET BY MOUTH THREE TIMES DAILY AS NEEDED. What changed:   how much to take  how to take this  when to take this  reasons to take this  additional instructions   amitriptyline 10 MG tablet Commonly known as: ELAVIL Take 1 tablet (10 mg total) by mouth at bedtime.   aspirin EC 81 MG tablet Take 1 tablet (81 mg total) by mouth 2 (two) times daily.   baclofen 10 MG tablet Commonly known as: LIORESAL TAKE 1/2 TABLET BY MOUTH 3 TIMES DAILY What changed: See the new  instructions.   [employer] Calcium 600+D 600-800 MG-UNIT Tabs Generic drug: Calcium Carb-Cholecalciferol TAKE 1 TABLET BY MOUTH TWICE A DAY BEFORE A MEAL What changed: See the new instructions.   Debrox 6.5 % OTIC solution Generic drug: carbamide peroxide Place 5 drops into the right ear 2 (two) times daily.   fluticasone 50 MCG/ACT nasal spray Commonly known as: FLONASE PLACE 2 SPRAYS IN EACH NOSTRIL EVERY DAY What changed: See the new instructions.   gabapentin 600 MG tablet Commonly known as: Neurontin Take 2 tablets (1,200 mg total) by mouth 3 (three) times daily.   HYDROcodone-acetaminophen 7.5-325 MG tablet Commonly known as: NORCO Take 1 tablet by mouth every 6 (six) hours as needed for moderate pain.   HYDROcodone-acetaminophen 7.5-325 MG tablet Commonly known as: Norco Take 1 tablet by mouth every 6 (six) hours as needed for moderate pain.   ketorolac 10 MG tablet Commonly known as: TORADOL Take 1 tablet (10 mg total) by mouth 2 (two) times daily as needed.   meloxicam 15 MG tablet Commonly known as: MOBIC Take 1 tablet (15 mg total) by mouth daily.   methocarbamol 500 MG tablet Commonly known as: ROBAXIN Take 1 tablet (500 mg total) by mouth every 8 (eight) hours as needed for muscle spasms.   methocarbamol 750 MG tablet Commonly known as: ROBAXIN Take 1 tablet (750 mg total) by mouth 2 (two) times daily as needed for muscle spasms.   multivitamin with minerals Tabs tablet Take 1 tablet by mouth daily.   ondansetron 4 MG tablet Commonly known as: ZOFRAN Take 1-2 tablets (4-8 mg total) by mouth every 8 (eight) hours as needed for nausea or vomiting.   oxyCODONE-acetaminophen 5-325 MG tablet Commonly known as: Percocet Take 1-2 tablets by mouth every 8 (eight) hours as needed for severe pain.   PHENobarbital 64.8 MG tablet Commonly known as: LUMINAL TAKE 3 TABLETS BY MOUTH AT BEDTIME   polyethylene glycol 17 g packet Commonly known as: MIRALAX / GLYCOLAX  Take 17 g by mouth daily.   potassium chloride SA 20 MEQ tablet Commonly known as: KLOR-CON Take 1 tablet (20 mEq total) by mouth 2 (two) times daily.   sulfamethoxazole-trimethoprim 800-160 MG tablet Commonly known as: BACTRIM DS Take 1 tablet by mouth 2 (two) times daily.   topiramate 50 MG tablet Commonly known as: TOPAMAX TAKE 1 TABLET BY MOUTH EVERY MORNING AND 2 TABLETS EVERY EVENING What changed:   how much to take  how to  take this  when to take this  additional instructions   triamcinolone ointment 0.1 % Commonly known as: KENALOG APPLY TO AFFECTED AREA TWICE A DAY       Diagnostic Studies: DG Pelvis Portable  Result Date: 11/22/2019 CLINICAL DATA:  Right hip replacement EXAM: PORTABLE PELVIS 1-2 VIEWS COMPARISON:  06/27/2019 FINDINGS: Interval right hip replacement in satisfactory position and alignment. Fusion hardware across the SI joint on the left unchanged. IMPRESSION: Satisfactory right hip replacement. Electronically Signed   By: Franchot Gallo M.D.   On: 11/22/2019 10:31   CT HIP RIGHT WO CONTRAST  Result Date: 11/05/2019 CLINICAL DATA:  Follow-up of right hip fracture. EXAM: CT OF THE RIGHT HIP WITHOUT CONTRAST TECHNIQUE: Multidetector CT imaging of the right hip was performed according to the standard protocol. Multiplanar CT image reconstructions were also generated. COMPARISON:  Radiographs dated 09/22/2019 and 07/16/2019 and 06/25/2019 FINDINGS: Bones/Joint/Cartilage There is an incompletely healed subcapital fracture of right femur. There is solid bony union at multiple levels but portions of the fracture line are still apparent, primarily the posterior aspect of the fracture. The pins are in good position with no evidence of loosening. Slight osteophyte formation on the superolateral aspect of the acetabulum and on the femoral head. The visualized pelvic bones are intact. Muscles and Tendons Negative. Soft tissues Negative. IMPRESSION: Incomplete healing  of the subcapital fracture of the right femur. There are areas of solid bony fusion. Electronically Signed   By: Lorriane Shire M.D.   On: 11/05/2019 15:02   DG C-Arm 1-60 Min  Result Date: 11/22/2019 CLINICAL DATA:  Elective surgery EXAM: OPERATIVE RIGHT HIP (WITH PELVIS IF PERFORMED) 2 VIEWS TECHNIQUE: Fluoroscopic spot image(s) were submitted for interpretation post-operatively. COMPARISON:  CT 11/05/2019 FINDINGS: Two intraoperative spot images demonstrate changes of right hip replacement. No hardware bony complicating feature. Normal AP alignment. IMPRESSION: Right hip replacement.  No visible complicating feature. Electronically Signed   By: Rolm Baptise M.D.   On: 11/22/2019 09:06   DG HIP OPERATIVE UNILAT WITH PELVIS RIGHT  Result Date: 11/22/2019 CLINICAL DATA:  Elective surgery EXAM: OPERATIVE RIGHT HIP (WITH PELVIS IF PERFORMED) 2 VIEWS TECHNIQUE: Fluoroscopic spot image(s) were submitted for interpretation post-operatively. COMPARISON:  CT 11/05/2019 FINDINGS: Two intraoperative spot images demonstrate changes of right hip replacement. No hardware bony complicating feature. Normal AP alignment. IMPRESSION: Right hip replacement.  No visible complicating feature. Electronically Signed   By: Rolm Baptise M.D.   On: 11/22/2019 09:06    Disposition: Discharge disposition: 01-Home or Self Care       Discharge Instructions    Call MD / Call 911   Complete by: As directed    If you experience chest pain or shortness of breath, CALL 911 and be transported to the hospital emergency room.  If you develope a fever above 101.5 F, pus (white drainage) or increased drainage or redness at the wound, or calf pain, call your surgeon's office.   Constipation Prevention   Complete by: As directed    Drink plenty of fluids.  Prune juice may be helpful.  You may use a stool softener, such as Colace (over the counter) 100 mg twice a day.  Use MiraLax (over the counter) for constipation as needed.    Driving restrictions   Complete by: As directed    No driving while taking narcotic pain meds.   Increase activity slowly as tolerated   Complete by: As directed       Follow-up Information  Leandrew Koyanagi, MD In 2 weeks.   Specialty: Orthopedic Surgery Why: For suture removal, For wound re-check Contact information: [place] [address] [phone]            Signed: Eduard Roux 11/24/2019, 5:53 PM

## 2019-11-25 ENCOUNTER — Other Ambulatory Visit: Payer: Self-pay

## 2019-11-25 ENCOUNTER — Telehealth: Payer: Self-pay

## 2019-11-25 DIAGNOSIS — G894 Chronic pain syndrome: Secondary | ICD-10-CM

## 2019-11-25 DIAGNOSIS — M79605 Pain in left leg: Secondary | ICD-10-CM

## 2019-11-25 DIAGNOSIS — G8929 Other chronic pain: Secondary | ICD-10-CM

## 2019-11-25 DIAGNOSIS — M533 Sacrococcygeal disorders, not elsewhere classified: Secondary | ICD-10-CM

## 2019-11-25 NOTE — Telephone Encounter (Signed)
Patient states on his Discharge paperwork said he should be using CPM and a foam block. He has not recieved this. Would like to know if he will need this. Had SU 11/22/2019.     CB [phone]

## 2019-11-25 NOTE — Telephone Encounter (Signed)
That's not correct.  He must have gotten the total knee paperwork.  Disregard.

## 2019-11-26 ENCOUNTER — Telehealth: Payer: Self-pay

## 2019-11-26 DIAGNOSIS — Z87891 Personal history of nicotine dependence: Secondary | ICD-10-CM | POA: Diagnosis not present

## 2019-11-26 DIAGNOSIS — I10 Essential (primary) hypertension: Secondary | ICD-10-CM | POA: Diagnosis not present

## 2019-11-26 DIAGNOSIS — G4700 Insomnia, unspecified: Secondary | ICD-10-CM | POA: Diagnosis not present

## 2019-11-26 DIAGNOSIS — G2581 Restless legs syndrome: Secondary | ICD-10-CM | POA: Diagnosis not present

## 2019-11-26 DIAGNOSIS — M8580 Other specified disorders of bone density and structure, unspecified site: Secondary | ICD-10-CM | POA: Diagnosis not present

## 2019-11-26 DIAGNOSIS — Z96641 Presence of right artificial hip joint: Secondary | ICD-10-CM | POA: Diagnosis not present

## 2019-11-26 DIAGNOSIS — S72011D Unspecified intracapsular fracture of right femur, subsequent encounter for closed fracture with routine healing: Secondary | ICD-10-CM | POA: Diagnosis not present

## 2019-11-26 DIAGNOSIS — J302 Other seasonal allergic rhinitis: Secondary | ICD-10-CM | POA: Diagnosis not present

## 2019-11-26 DIAGNOSIS — M5136 Other intervertebral disc degeneration, lumbar region: Secondary | ICD-10-CM | POA: Diagnosis not present

## 2019-11-26 DIAGNOSIS — Z7901 Long term (current) use of anticoagulants: Secondary | ICD-10-CM | POA: Diagnosis not present

## 2019-11-26 DIAGNOSIS — K219 Gastro-esophageal reflux disease without esophagitis: Secondary | ICD-10-CM | POA: Diagnosis not present

## 2019-11-26 DIAGNOSIS — Z87820 Personal history of traumatic brain injury: Secondary | ICD-10-CM | POA: Diagnosis not present

## 2019-11-26 DIAGNOSIS — E785 Hyperlipidemia, unspecified: Secondary | ICD-10-CM | POA: Diagnosis not present

## 2019-11-26 DIAGNOSIS — R569 Unspecified convulsions: Secondary | ICD-10-CM | POA: Diagnosis not present

## 2019-11-26 DIAGNOSIS — R208 Other disturbances of skin sensation: Secondary | ICD-10-CM | POA: Diagnosis not present

## 2019-11-26 DIAGNOSIS — J4520 Mild intermittent asthma, uncomplicated: Secondary | ICD-10-CM | POA: Diagnosis not present

## 2019-11-26 DIAGNOSIS — G252 Other specified forms of tremor: Secondary | ICD-10-CM | POA: Diagnosis not present

## 2019-11-26 DIAGNOSIS — M533 Sacrococcygeal disorders, not elsewhere classified: Secondary | ICD-10-CM | POA: Diagnosis not present

## 2019-11-26 DIAGNOSIS — H9190 Unspecified hearing loss, unspecified ear: Secondary | ICD-10-CM | POA: Diagnosis not present

## 2019-11-26 DIAGNOSIS — G894 Chronic pain syndrome: Secondary | ICD-10-CM | POA: Diagnosis not present

## 2019-11-26 DIAGNOSIS — M1990 Unspecified osteoarthritis, unspecified site: Secondary | ICD-10-CM | POA: Diagnosis not present

## 2019-11-26 DIAGNOSIS — Z9181 History of falling: Secondary | ICD-10-CM | POA: Diagnosis not present

## 2019-11-26 MED ORDER — HYDROCODONE-ACETAMINOPHEN 7.5-325 MG PO TABS: 1.0000 | ORAL_TABLET | Freq: Four times a day (QID) | ORAL | 0 refills | Status: DC | PRN

## 2019-11-26 NOTE — Telephone Encounter (Signed)
Tiffany for [facility] needs verbal orders for  1 time for 1 week, 3 times for 1 week ,2 times a week and 1 times a week for one week  Tiffany  cb [phone]

## 2019-11-26 NOTE — Telephone Encounter (Signed)
I called patient to advise.  

## 2019-11-26 NOTE — Telephone Encounter (Signed)
Ok for orders? 

## 2019-11-27 LAB — AEROBIC/ANAEROBIC CULTURE W GRAM STAIN (SURGICAL/DEEP WOUND)
Culture: NO GROWTH
Culture: NO GROWTH

## 2019-11-27 NOTE — Telephone Encounter (Signed)
Yes thanks 

## 2019-11-29 DIAGNOSIS — R208 Other disturbances of skin sensation: Secondary | ICD-10-CM | POA: Diagnosis not present

## 2019-11-29 DIAGNOSIS — M5136 Other intervertebral disc degeneration, lumbar region: Secondary | ICD-10-CM | POA: Diagnosis not present

## 2019-11-29 DIAGNOSIS — M1990 Unspecified osteoarthritis, unspecified site: Secondary | ICD-10-CM | POA: Diagnosis not present

## 2019-11-29 DIAGNOSIS — R569 Unspecified convulsions: Secondary | ICD-10-CM | POA: Diagnosis not present

## 2019-11-29 DIAGNOSIS — J302 Other seasonal allergic rhinitis: Secondary | ICD-10-CM | POA: Diagnosis not present

## 2019-11-29 DIAGNOSIS — H9190 Unspecified hearing loss, unspecified ear: Secondary | ICD-10-CM | POA: Diagnosis not present

## 2019-11-29 DIAGNOSIS — G2581 Restless legs syndrome: Secondary | ICD-10-CM | POA: Diagnosis not present

## 2019-11-29 DIAGNOSIS — Z96641 Presence of right artificial hip joint: Secondary | ICD-10-CM | POA: Diagnosis not present

## 2019-11-29 DIAGNOSIS — M8580 Other specified disorders of bone density and structure, unspecified site: Secondary | ICD-10-CM | POA: Diagnosis not present

## 2019-11-29 DIAGNOSIS — Z7901 Long term (current) use of anticoagulants: Secondary | ICD-10-CM | POA: Diagnosis not present

## 2019-11-29 DIAGNOSIS — M533 Sacrococcygeal disorders, not elsewhere classified: Secondary | ICD-10-CM | POA: Diagnosis not present

## 2019-11-29 DIAGNOSIS — G894 Chronic pain syndrome: Secondary | ICD-10-CM | POA: Diagnosis not present

## 2019-11-29 DIAGNOSIS — G4700 Insomnia, unspecified: Secondary | ICD-10-CM | POA: Diagnosis not present

## 2019-11-29 DIAGNOSIS — I10 Essential (primary) hypertension: Secondary | ICD-10-CM | POA: Diagnosis not present

## 2019-11-29 DIAGNOSIS — K219 Gastro-esophageal reflux disease without esophagitis: Secondary | ICD-10-CM | POA: Diagnosis not present

## 2019-11-29 DIAGNOSIS — Z87820 Personal history of traumatic brain injury: Secondary | ICD-10-CM | POA: Diagnosis not present

## 2019-11-29 DIAGNOSIS — S72011D Unspecified intracapsular fracture of right femur, subsequent encounter for closed fracture with routine healing: Secondary | ICD-10-CM | POA: Diagnosis not present

## 2019-11-29 DIAGNOSIS — J4520 Mild intermittent asthma, uncomplicated: Secondary | ICD-10-CM | POA: Diagnosis not present

## 2019-11-29 DIAGNOSIS — Z9181 History of falling: Secondary | ICD-10-CM | POA: Diagnosis not present

## 2019-11-29 DIAGNOSIS — Z87891 Personal history of nicotine dependence: Secondary | ICD-10-CM | POA: Diagnosis not present

## 2019-11-29 DIAGNOSIS — E785 Hyperlipidemia, unspecified: Secondary | ICD-10-CM | POA: Diagnosis not present

## 2019-11-29 DIAGNOSIS — G252 Other specified forms of tremor: Secondary | ICD-10-CM | POA: Diagnosis not present

## 2019-11-29 NOTE — Telephone Encounter (Signed)
Verbal order given  

## 2019-11-30 DIAGNOSIS — E785 Hyperlipidemia, unspecified: Secondary | ICD-10-CM | POA: Diagnosis not present

## 2019-11-30 DIAGNOSIS — R208 Other disturbances of skin sensation: Secondary | ICD-10-CM | POA: Diagnosis not present

## 2019-11-30 DIAGNOSIS — M533 Sacrococcygeal disorders, not elsewhere classified: Secondary | ICD-10-CM | POA: Diagnosis not present

## 2019-11-30 DIAGNOSIS — K219 Gastro-esophageal reflux disease without esophagitis: Secondary | ICD-10-CM | POA: Diagnosis not present

## 2019-11-30 DIAGNOSIS — I10 Essential (primary) hypertension: Secondary | ICD-10-CM | POA: Diagnosis not present

## 2019-11-30 DIAGNOSIS — G4700 Insomnia, unspecified: Secondary | ICD-10-CM | POA: Diagnosis not present

## 2019-11-30 DIAGNOSIS — R569 Unspecified convulsions: Secondary | ICD-10-CM | POA: Diagnosis not present

## 2019-11-30 DIAGNOSIS — Z9181 History of falling: Secondary | ICD-10-CM | POA: Diagnosis not present

## 2019-11-30 DIAGNOSIS — M1990 Unspecified osteoarthritis, unspecified site: Secondary | ICD-10-CM | POA: Diagnosis not present

## 2019-11-30 DIAGNOSIS — M8580 Other specified disorders of bone density and structure, unspecified site: Secondary | ICD-10-CM | POA: Diagnosis not present

## 2019-11-30 DIAGNOSIS — Z87820 Personal history of traumatic brain injury: Secondary | ICD-10-CM | POA: Diagnosis not present

## 2019-11-30 DIAGNOSIS — G894 Chronic pain syndrome: Secondary | ICD-10-CM | POA: Diagnosis not present

## 2019-11-30 DIAGNOSIS — J4520 Mild intermittent asthma, uncomplicated: Secondary | ICD-10-CM | POA: Diagnosis not present

## 2019-11-30 DIAGNOSIS — M5136 Other intervertebral disc degeneration, lumbar region: Secondary | ICD-10-CM | POA: Diagnosis not present

## 2019-11-30 DIAGNOSIS — H9190 Unspecified hearing loss, unspecified ear: Secondary | ICD-10-CM | POA: Diagnosis not present

## 2019-11-30 DIAGNOSIS — S72011D Unspecified intracapsular fracture of right femur, subsequent encounter for closed fracture with routine healing: Secondary | ICD-10-CM | POA: Diagnosis not present

## 2019-11-30 DIAGNOSIS — G252 Other specified forms of tremor: Secondary | ICD-10-CM | POA: Diagnosis not present

## 2019-11-30 DIAGNOSIS — Z7901 Long term (current) use of anticoagulants: Secondary | ICD-10-CM | POA: Diagnosis not present

## 2019-11-30 DIAGNOSIS — J302 Other seasonal allergic rhinitis: Secondary | ICD-10-CM | POA: Diagnosis not present

## 2019-11-30 DIAGNOSIS — Z87891 Personal history of nicotine dependence: Secondary | ICD-10-CM | POA: Diagnosis not present

## 2019-11-30 DIAGNOSIS — Z96641 Presence of right artificial hip joint: Secondary | ICD-10-CM | POA: Diagnosis not present

## 2019-11-30 DIAGNOSIS — G2581 Restless legs syndrome: Secondary | ICD-10-CM | POA: Diagnosis not present

## 2019-12-02 DIAGNOSIS — J302 Other seasonal allergic rhinitis: Secondary | ICD-10-CM | POA: Diagnosis not present

## 2019-12-02 DIAGNOSIS — J4520 Mild intermittent asthma, uncomplicated: Secondary | ICD-10-CM | POA: Diagnosis not present

## 2019-12-02 DIAGNOSIS — M533 Sacrococcygeal disorders, not elsewhere classified: Secondary | ICD-10-CM | POA: Diagnosis not present

## 2019-12-02 DIAGNOSIS — G252 Other specified forms of tremor: Secondary | ICD-10-CM | POA: Diagnosis not present

## 2019-12-02 DIAGNOSIS — G2581 Restless legs syndrome: Secondary | ICD-10-CM | POA: Diagnosis not present

## 2019-12-02 DIAGNOSIS — E785 Hyperlipidemia, unspecified: Secondary | ICD-10-CM | POA: Diagnosis not present

## 2019-12-02 DIAGNOSIS — M1990 Unspecified osteoarthritis, unspecified site: Secondary | ICD-10-CM | POA: Diagnosis not present

## 2019-12-02 DIAGNOSIS — K219 Gastro-esophageal reflux disease without esophagitis: Secondary | ICD-10-CM | POA: Diagnosis not present

## 2019-12-02 DIAGNOSIS — I10 Essential (primary) hypertension: Secondary | ICD-10-CM | POA: Diagnosis not present

## 2019-12-02 DIAGNOSIS — M8580 Other specified disorders of bone density and structure, unspecified site: Secondary | ICD-10-CM | POA: Diagnosis not present

## 2019-12-02 DIAGNOSIS — Z9181 History of falling: Secondary | ICD-10-CM | POA: Diagnosis not present

## 2019-12-02 DIAGNOSIS — S72011D Unspecified intracapsular fracture of right femur, subsequent encounter for closed fracture with routine healing: Secondary | ICD-10-CM | POA: Diagnosis not present

## 2019-12-02 DIAGNOSIS — R208 Other disturbances of skin sensation: Secondary | ICD-10-CM | POA: Diagnosis not present

## 2019-12-02 DIAGNOSIS — G894 Chronic pain syndrome: Secondary | ICD-10-CM | POA: Diagnosis not present

## 2019-12-02 DIAGNOSIS — G4700 Insomnia, unspecified: Secondary | ICD-10-CM | POA: Diagnosis not present

## 2019-12-02 DIAGNOSIS — Z7901 Long term (current) use of anticoagulants: Secondary | ICD-10-CM | POA: Diagnosis not present

## 2019-12-02 DIAGNOSIS — Z87891 Personal history of nicotine dependence: Secondary | ICD-10-CM | POA: Diagnosis not present

## 2019-12-02 DIAGNOSIS — R569 Unspecified convulsions: Secondary | ICD-10-CM | POA: Diagnosis not present

## 2019-12-02 DIAGNOSIS — Z87820 Personal history of traumatic brain injury: Secondary | ICD-10-CM | POA: Diagnosis not present

## 2019-12-02 DIAGNOSIS — M5136 Other intervertebral disc degeneration, lumbar region: Secondary | ICD-10-CM | POA: Diagnosis not present

## 2019-12-02 DIAGNOSIS — H9190 Unspecified hearing loss, unspecified ear: Secondary | ICD-10-CM | POA: Diagnosis not present

## 2019-12-02 DIAGNOSIS — Z96641 Presence of right artificial hip joint: Secondary | ICD-10-CM | POA: Diagnosis not present

## 2019-12-07 ENCOUNTER — Encounter: Payer: Self-pay | Admitting: Orthopaedic Surgery

## 2019-12-07 ENCOUNTER — Other Ambulatory Visit: Payer: Self-pay

## 2019-12-07 ENCOUNTER — Ambulatory Visit (INDEPENDENT_AMBULATORY_CARE_PROVIDER_SITE_OTHER): Payer: Medicare Other

## 2019-12-07 ENCOUNTER — Ambulatory Visit (INDEPENDENT_AMBULATORY_CARE_PROVIDER_SITE_OTHER): Payer: Medicare Other | Admitting: Orthopaedic Surgery

## 2019-12-07 DIAGNOSIS — Z7901 Long term (current) use of anticoagulants: Secondary | ICD-10-CM | POA: Diagnosis not present

## 2019-12-07 DIAGNOSIS — S72011D Unspecified intracapsular fracture of right femur, subsequent encounter for closed fracture with routine healing: Secondary | ICD-10-CM | POA: Diagnosis not present

## 2019-12-07 DIAGNOSIS — M8580 Other specified disorders of bone density and structure, unspecified site: Secondary | ICD-10-CM | POA: Diagnosis not present

## 2019-12-07 DIAGNOSIS — Z87820 Personal history of traumatic brain injury: Secondary | ICD-10-CM | POA: Diagnosis not present

## 2019-12-07 DIAGNOSIS — I10 Essential (primary) hypertension: Secondary | ICD-10-CM | POA: Diagnosis not present

## 2019-12-07 DIAGNOSIS — J4520 Mild intermittent asthma, uncomplicated: Secondary | ICD-10-CM | POA: Diagnosis not present

## 2019-12-07 DIAGNOSIS — E785 Hyperlipidemia, unspecified: Secondary | ICD-10-CM | POA: Diagnosis not present

## 2019-12-07 DIAGNOSIS — G894 Chronic pain syndrome: Secondary | ICD-10-CM | POA: Diagnosis not present

## 2019-12-07 DIAGNOSIS — Z96641 Presence of right artificial hip joint: Secondary | ICD-10-CM

## 2019-12-07 DIAGNOSIS — J302 Other seasonal allergic rhinitis: Secondary | ICD-10-CM | POA: Diagnosis not present

## 2019-12-07 DIAGNOSIS — R208 Other disturbances of skin sensation: Secondary | ICD-10-CM | POA: Diagnosis not present

## 2019-12-07 DIAGNOSIS — Z87891 Personal history of nicotine dependence: Secondary | ICD-10-CM | POA: Diagnosis not present

## 2019-12-07 DIAGNOSIS — G2581 Restless legs syndrome: Secondary | ICD-10-CM | POA: Diagnosis not present

## 2019-12-07 DIAGNOSIS — H9190 Unspecified hearing loss, unspecified ear: Secondary | ICD-10-CM | POA: Diagnosis not present

## 2019-12-07 DIAGNOSIS — M1990 Unspecified osteoarthritis, unspecified site: Secondary | ICD-10-CM | POA: Diagnosis not present

## 2019-12-07 DIAGNOSIS — G4700 Insomnia, unspecified: Secondary | ICD-10-CM | POA: Diagnosis not present

## 2019-12-07 DIAGNOSIS — G252 Other specified forms of tremor: Secondary | ICD-10-CM | POA: Diagnosis not present

## 2019-12-07 DIAGNOSIS — M5136 Other intervertebral disc degeneration, lumbar region: Secondary | ICD-10-CM | POA: Diagnosis not present

## 2019-12-07 DIAGNOSIS — R569 Unspecified convulsions: Secondary | ICD-10-CM | POA: Diagnosis not present

## 2019-12-07 DIAGNOSIS — M533 Sacrococcygeal disorders, not elsewhere classified: Secondary | ICD-10-CM | POA: Diagnosis not present

## 2019-12-07 DIAGNOSIS — Z9181 History of falling: Secondary | ICD-10-CM | POA: Diagnosis not present

## 2019-12-07 DIAGNOSIS — K219 Gastro-esophageal reflux disease without esophagitis: Secondary | ICD-10-CM | POA: Diagnosis not present

## 2019-12-07 MED ORDER — AMOXICILLIN 500 MG PO CAPS: ORAL_CAPSULE | ORAL | 0 refills | Status: DC

## 2019-12-07 MED ORDER — OXYCODONE-ACETAMINOPHEN 5-325 MG PO TABS: 1.0000 | ORAL_TABLET | Freq: Three times a day (TID) | ORAL | 0 refills | Status: DC | PRN

## 2019-12-07 NOTE — Progress Notes (Signed)
Post-Op Visit Note   Patient: Richard Glass           Date of Birth: 1971/07/03           MRN: [id number] Visit Date: 12/07/2019 PCP: [name], [name], MD   Assessment & Plan:  Chief Complaint:  Chief Complaint  Patient presents with  . Right Hip - Pain, Routine Post Op   Visit Diagnoses:  1. Status post right hip replacement     Plan: Patient is a pleasant 49 year old gentleman who comes in today 2 weeks out conversion of right hip pinning to a total hip replacement.  He has been doing well.  He is getting home health physical therapy and he is now ambulating with a cane.  No fevers or chills.  Examination of his right hip reveals well-healing surgical incisions without complication.  There are nylon sutures in place.  Calf is soft and nontender.  He is neurovascular intact distally.  Today, nylon sutures were removed and Steri-Strips applied.  He will continue to advance with therapy as tolerated.  Dental prophylaxis reinforced.  Follow-up with [place] in 4 weeks time for repeat evaluation and AP pelvis lateral right hip x-rays.  Call with concerns or questions in the meantime.  Follow-Up Instructions: Return in about 4 weeks (around 01/04/2020).   Orders:  Orders Placed This Encounter  Procedures  . XR HIP UNILAT W OR W/O PELVIS 2-3 VIEWS RIGHT   Meds ordered this encounter  Medications  . oxyCODONE-acetaminophen (PERCOCET) 5-325 MG tablet    Sig: Take 1-2 tablets by mouth 3 (three) times daily as needed for severe pain.    Dispense:  30 tablet    Refill:  0  . amoxicillin (AMOXIL) 500 MG capsule    Sig: Take 4 pills one hour prior to dental procedure or colonoscopy    Dispense:  12 capsule    Refill:  0    Imaging: XR HIP UNILAT W OR W/O PELVIS 2-3 VIEWS RIGHT  Result Date: 12/07/2019 Well seated prosthesis without complication   PMFS History: Patient Active Problem List   Diagnosis Date Noted  . Status post total replacement of right hip 11/22/2019  . Painful  orthopaedic hardware ([place]) 11/11/2019  . 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 Weight loss, non-intentional 10/14/2019  . Bilateral leg weakness 09/03/2019  . S/P right hip fracture 07/07/2019  . Right leg pain 06/26/2019  . Femoral fracture ([place]) 06/26/2019  . Hearing loss 05/01/2019  . Chronic left leg pain 11/20/2018  . Allodynia, left leg 11/20/2018  . History of traumatic brain injury 02/23/2018  . High risk medication use, Combination of Opioid and Benzodiazapine 02/12/2018  . 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 SI (sacroiliac) pain 03/20/2017  . Enchondroma of left fibular head 05/17/2016  . Chronic pain syndrome 01/18/2016  . Anxiety state 08/08/2013  . Cerumen impaction 08/06/2013  . Insomnia 02/02/2013  . Right flank pain 05/21/2011  . 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 Encounter for chronic pain management 07/04/2009  . Restless leg 09/29/2007  . Hyperlipidemia 11/13/2006  . Allergic rhinitis 11/13/2006  . GASTROESOPHAGEAL REFLUX, NO ESOPHAGITIS 11/13/2006  . Low back pain 11/13/2006  . Convulsions ([place]) 11/13/2006  . Seizure ([place]) 11/13/2006   Past Medical History:  Diagnosis Date  . 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  . Allergic rhinitis 11/13/2006       .  Anxiety   . Anxiety state 08/08/2013  . Asthma   . ASTHMA, INTERMITTENT 07/08/2010   Qualifier: Diagnosis of  By: Walker Kehr MD, Patrick Jupiter    . Back pain of lumbar region with sciatica 11/13/2006   MRI 12/2014 - lumbar DDD without focal neural impingement  MRI Lumbar 10/30/16 ([place]) Small central L5-S1 disc extrusion with minimal cranial migration and associated annular fissure approaches descending left S1 nerve roots without contact or displacement. Minimal bulging disc height loss without spinal canal stenosis or neural foraminal narrowing.  Minimal  bulging disc at L2-L3 through L4-L5 without spinal canal stenosis or neural foraminal narrowing.  MRI Sacrum 10/30/16 ([place]) No fracture. Small lesion left sacral ala most likely a subchondral cyst/erosion adjacent to the sacroiliac joint.     . 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 Carpal tunnel syndrome 01/09/2015   Left 12/2014   . Cervical spine pain 02/06/2015   MRI 03/2015 FINDINGS: Vertebral body height, signal and alignment are normal. The craniocervical junction is normal and cervical cord signal is normal. The central spinal canal and neural foramina are widely patent at all levels. Scattered, mild degenerative change appears most notable at C4-5. Imaged paraspinous structures are unremarkable.  IMPRESSION: Negative for central canal or foraminal narrowing. No finding to explain the patient's symptoms. Scattered, mild facet degenerative disease is noted.   . Chronic low back pain   . Chronic pain 01/18/2016  . Chronic pain syndrome 01/18/2016  . Coccyx pain 02/06/2015  . Contact dermatitis or eczema 02/13/2018  . Convulsions ([place]) 11/13/2006     Cystic encephalomalacia left temporal and parietal regions from remote injury.   . Degenerative arthritis   . Dyslipidemia   . External hemorrhoid, bleeding 06/04/2011  . GASTROESOPHAGEAL REFLUX, NO ESOPHAGITIS 11/13/2006   Qualifier: Diagnosis of  By: Eusebio Friendly    . Hamstring muscle strain, left, initial encounter 06/26/2018  . Headache(784.0)   . Hyperlipidemia 11/13/2006   07/2016  ASCVD score of 4.7% - recommended lifestyle changes   . Insomnia 02/02/2013  . Intention tremor 10/31/2009   Qualifier: Diagnosis of  By: Walker Kehr MD, Patrick Jupiter    . Intercostal pain 04/25/2015  . Left foot pain 08/21/2018  . Left knee injury 05/09/2016  . 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 Morton's neuroma of left foot 01/18/2016  . Osteopenia 10/10/2016  . Overweight(278.02) 06/13/2009   Qualifier: Diagnosis of  By: Hedy Camara    . Pain in joint, shoulder region 07/05/2014   LEFT > Right Reports hx rotator cuff surgery on rigt shoulder previously (Dr Nicholes Stairs) but no notes available XRAY right shoulder showed some proliferative changes distal rigt clavicle   . Rash and nonspecific skin eruption 10/10/2016  . Restless leg 09/29/2007   Qualifier: Diagnosis of  By: Walker Kehr MD, Patrick Jupiter    . Restless legs syndrome (RLS)   . Sciatica of left side 10/26/2014  . Seizures ([place])    last >72yrs  . SI (sacroiliac) pain 03/20/2017  . Sinusitis chronic, frontal   . Status post lumbar spinal fusion 03/20/2017  . Subacromial or subdeltoid bursitis 08/08/2009   MRI C-spine 2011([facility]) - normal MRI left shoulder 10/2010 ([facility]) - AC degenerative disease, normal rotator cuff 12/2012 -debridement, acromioplasty and distal clavicle excision of left shoulder, arthroscopy also performed an no need for rotator cuff repair - performed by [facility] 02/2013 - Nerve Conduction Studies ([facility]) - ulnar nerve compression 02/2013- taken back to OR for  Ulnar nerve decompression 06/2013 Repeat MRI left shoulder - subacromial/subdeltoid bursitis, a small intersubstance tear to the distal infraspinatus and evidence of interval resection of the distal clavicle 06/2013 - steroid injection of left biceps 09/2013 -Repeat EMG showed improvement of ulnar nerve compression 10/2013 - referred to [facility] for second opinion due to intractable pain 11/2013 - Seen by Dr. Marlou Sa at [facility]; discussed risks/benefits of surgical intervention and bursectomy, no plan for surgery at this time     . 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 Cancer Father        lung  . Heart disease Father   . Hypertension Mother   . Seizures Neg Hx     Past Surgical History:  Procedure Laterality  Date  . [place]   struck by golf ball-49 yrs old  . CRANIOTOMY     metal plate  . HARDWARE REMOVAL Right 11/22/2019   Procedure: HARDWARE REMOVAL;  Surgeon: Leandrew Koyanagi, MD;  Location: [place];  Service: Orthopedics;  Laterality: Right;  . HIP PINNING,CANNULATED Right 06/27/2019   Procedure: HIP PERCUTANEOUS PINNING;  Surgeon: Meredith Pel, MD;  Location: [place];  Service: Orthopedics;  Laterality: Right;  . NASAL FRACTURE SURGERY  1997  . SACROILIAC JOINT FUSION Left 03/20/2017   Procedure: SACROILIAC JOINT FUSION;  Surgeon: Melina Schools, MD;  Location: [place];  Service: Orthopedics;  Laterality: Left;  90 mins  . SHOULDER ARTHROSCOPY Left    "cleaned arthritis out"  . SHOULDER SURGERY Right 2012   Rotator cuff surgery  . TONSILLECTOMY    . TOTAL HIP ARTHROPLASTY Right 11/22/2019   Procedure: CONVERSION OF PREVIOUS RIGHT HIP SURGERY TO TOTAL HIP ARTHROPLASTY ANTERIOR APPROACH, HARDWARE REMOVAL;  Surgeon: Leandrew Koyanagi, MD;  Location: [place];  Service: Orthopedics;  Laterality: Right;  . UVULOPALATOPHARYNGOPLASTY (UPPP)/TONSILLECTOMY/SEPTOPLASTY  2001   Social History   Occupational History  . Occupation: disabled  Tobacco Use  . Smoking status: Former Smoker    Packs/day: 1.00    Years: 22.00    Pack years: 22.00    Types: Cigarettes    Quit date: 06/22/2012    Years since quitting: 7.4  . Smokeless tobacco: Current User    Types: Snuff  . Tobacco comment: per patient uses "dip" about twice a day   Substance and Sexual Activity  . Alcohol use: No    Alcohol/week: 0.0 standard drinks    Comment: none since 7/11  . Drug use: No  . Sexual activity: Never

## 2019-12-09 DIAGNOSIS — M1990 Unspecified osteoarthritis, unspecified site: Secondary | ICD-10-CM | POA: Diagnosis not present

## 2019-12-09 DIAGNOSIS — K219 Gastro-esophageal reflux disease without esophagitis: Secondary | ICD-10-CM | POA: Diagnosis not present

## 2019-12-09 DIAGNOSIS — Z7901 Long term (current) use of anticoagulants: Secondary | ICD-10-CM | POA: Diagnosis not present

## 2019-12-09 DIAGNOSIS — M533 Sacrococcygeal disorders, not elsewhere classified: Secondary | ICD-10-CM | POA: Diagnosis not present

## 2019-12-09 DIAGNOSIS — E785 Hyperlipidemia, unspecified: Secondary | ICD-10-CM | POA: Diagnosis not present

## 2019-12-09 DIAGNOSIS — Z87820 Personal history of traumatic brain injury: Secondary | ICD-10-CM | POA: Diagnosis not present

## 2019-12-09 DIAGNOSIS — I10 Essential (primary) hypertension: Secondary | ICD-10-CM | POA: Diagnosis not present

## 2019-12-09 DIAGNOSIS — Z96641 Presence of right artificial hip joint: Secondary | ICD-10-CM | POA: Diagnosis not present

## 2019-12-09 DIAGNOSIS — G2581 Restless legs syndrome: Secondary | ICD-10-CM | POA: Diagnosis not present

## 2019-12-09 DIAGNOSIS — M8580 Other specified disorders of bone density and structure, unspecified site: Secondary | ICD-10-CM | POA: Diagnosis not present

## 2019-12-09 DIAGNOSIS — G894 Chronic pain syndrome: Secondary | ICD-10-CM | POA: Diagnosis not present

## 2019-12-09 DIAGNOSIS — Z9181 History of falling: Secondary | ICD-10-CM | POA: Diagnosis not present

## 2019-12-09 DIAGNOSIS — H9190 Unspecified hearing loss, unspecified ear: Secondary | ICD-10-CM | POA: Diagnosis not present

## 2019-12-09 DIAGNOSIS — J4520 Mild intermittent asthma, uncomplicated: Secondary | ICD-10-CM | POA: Diagnosis not present

## 2019-12-09 DIAGNOSIS — R208 Other disturbances of skin sensation: Secondary | ICD-10-CM | POA: Diagnosis not present

## 2019-12-09 DIAGNOSIS — Z87891 Personal history of nicotine dependence: Secondary | ICD-10-CM | POA: Diagnosis not present

## 2019-12-09 DIAGNOSIS — J302 Other seasonal allergic rhinitis: Secondary | ICD-10-CM | POA: Diagnosis not present

## 2019-12-09 DIAGNOSIS — G252 Other specified forms of tremor: Secondary | ICD-10-CM | POA: Diagnosis not present

## 2019-12-09 DIAGNOSIS — S72011D Unspecified intracapsular fracture of right femur, subsequent encounter for closed fracture with routine healing: Secondary | ICD-10-CM | POA: Diagnosis not present

## 2019-12-09 DIAGNOSIS — R569 Unspecified convulsions: Secondary | ICD-10-CM | POA: Diagnosis not present

## 2019-12-09 DIAGNOSIS — M5136 Other intervertebral disc degeneration, lumbar region: Secondary | ICD-10-CM | POA: Diagnosis not present

## 2019-12-09 DIAGNOSIS — G4700 Insomnia, unspecified: Secondary | ICD-10-CM | POA: Diagnosis not present

## 2019-12-14 DIAGNOSIS — J302 Other seasonal allergic rhinitis: Secondary | ICD-10-CM | POA: Diagnosis not present

## 2019-12-14 DIAGNOSIS — G894 Chronic pain syndrome: Secondary | ICD-10-CM | POA: Diagnosis not present

## 2019-12-14 DIAGNOSIS — I10 Essential (primary) hypertension: Secondary | ICD-10-CM | POA: Diagnosis not present

## 2019-12-14 DIAGNOSIS — G252 Other specified forms of tremor: Secondary | ICD-10-CM | POA: Diagnosis not present

## 2019-12-14 DIAGNOSIS — G4700 Insomnia, unspecified: Secondary | ICD-10-CM | POA: Diagnosis not present

## 2019-12-14 DIAGNOSIS — Z87891 Personal history of nicotine dependence: Secondary | ICD-10-CM | POA: Diagnosis not present

## 2019-12-14 DIAGNOSIS — M5136 Other intervertebral disc degeneration, lumbar region: Secondary | ICD-10-CM | POA: Diagnosis not present

## 2019-12-14 DIAGNOSIS — Z87820 Personal history of traumatic brain injury: Secondary | ICD-10-CM | POA: Diagnosis not present

## 2019-12-14 DIAGNOSIS — E785 Hyperlipidemia, unspecified: Secondary | ICD-10-CM | POA: Diagnosis not present

## 2019-12-14 DIAGNOSIS — Z96641 Presence of right artificial hip joint: Secondary | ICD-10-CM | POA: Diagnosis not present

## 2019-12-14 DIAGNOSIS — H9190 Unspecified hearing loss, unspecified ear: Secondary | ICD-10-CM | POA: Diagnosis not present

## 2019-12-14 DIAGNOSIS — M1990 Unspecified osteoarthritis, unspecified site: Secondary | ICD-10-CM | POA: Diagnosis not present

## 2019-12-14 DIAGNOSIS — G2581 Restless legs syndrome: Secondary | ICD-10-CM | POA: Diagnosis not present

## 2019-12-14 DIAGNOSIS — R208 Other disturbances of skin sensation: Secondary | ICD-10-CM | POA: Diagnosis not present

## 2019-12-14 DIAGNOSIS — Z7901 Long term (current) use of anticoagulants: Secondary | ICD-10-CM | POA: Diagnosis not present

## 2019-12-14 DIAGNOSIS — M8580 Other specified disorders of bone density and structure, unspecified site: Secondary | ICD-10-CM | POA: Diagnosis not present

## 2019-12-14 DIAGNOSIS — J4520 Mild intermittent asthma, uncomplicated: Secondary | ICD-10-CM | POA: Diagnosis not present

## 2019-12-14 DIAGNOSIS — S72011D Unspecified intracapsular fracture of right femur, subsequent encounter for closed fracture with routine healing: Secondary | ICD-10-CM | POA: Diagnosis not present

## 2019-12-14 DIAGNOSIS — Z9181 History of falling: Secondary | ICD-10-CM | POA: Diagnosis not present

## 2019-12-14 DIAGNOSIS — M533 Sacrococcygeal disorders, not elsewhere classified: Secondary | ICD-10-CM | POA: Diagnosis not present

## 2019-12-14 DIAGNOSIS — R569 Unspecified convulsions: Secondary | ICD-10-CM | POA: Diagnosis not present

## 2019-12-14 DIAGNOSIS — K219 Gastro-esophageal reflux disease without esophagitis: Secondary | ICD-10-CM | POA: Diagnosis not present

## 2019-12-15 DIAGNOSIS — G252 Other specified forms of tremor: Secondary | ICD-10-CM | POA: Diagnosis not present

## 2019-12-15 DIAGNOSIS — M533 Sacrococcygeal disorders, not elsewhere classified: Secondary | ICD-10-CM | POA: Diagnosis not present

## 2019-12-15 DIAGNOSIS — K219 Gastro-esophageal reflux disease without esophagitis: Secondary | ICD-10-CM | POA: Diagnosis not present

## 2019-12-15 DIAGNOSIS — R208 Other disturbances of skin sensation: Secondary | ICD-10-CM | POA: Diagnosis not present

## 2019-12-15 DIAGNOSIS — Z87820 Personal history of traumatic brain injury: Secondary | ICD-10-CM | POA: Diagnosis not present

## 2019-12-15 DIAGNOSIS — R569 Unspecified convulsions: Secondary | ICD-10-CM | POA: Diagnosis not present

## 2019-12-15 DIAGNOSIS — S72011D Unspecified intracapsular fracture of right femur, subsequent encounter for closed fracture with routine healing: Secondary | ICD-10-CM | POA: Diagnosis not present

## 2019-12-15 DIAGNOSIS — Z7901 Long term (current) use of anticoagulants: Secondary | ICD-10-CM | POA: Diagnosis not present

## 2019-12-15 DIAGNOSIS — G2581 Restless legs syndrome: Secondary | ICD-10-CM | POA: Diagnosis not present

## 2019-12-15 DIAGNOSIS — Z87891 Personal history of nicotine dependence: Secondary | ICD-10-CM | POA: Diagnosis not present

## 2019-12-15 DIAGNOSIS — G894 Chronic pain syndrome: Secondary | ICD-10-CM | POA: Diagnosis not present

## 2019-12-15 DIAGNOSIS — Z96641 Presence of right artificial hip joint: Secondary | ICD-10-CM | POA: Diagnosis not present

## 2019-12-15 DIAGNOSIS — M5136 Other intervertebral disc degeneration, lumbar region: Secondary | ICD-10-CM | POA: Diagnosis not present

## 2019-12-15 DIAGNOSIS — H9190 Unspecified hearing loss, unspecified ear: Secondary | ICD-10-CM | POA: Diagnosis not present

## 2019-12-15 DIAGNOSIS — J302 Other seasonal allergic rhinitis: Secondary | ICD-10-CM | POA: Diagnosis not present

## 2019-12-15 DIAGNOSIS — E785 Hyperlipidemia, unspecified: Secondary | ICD-10-CM | POA: Diagnosis not present

## 2019-12-15 DIAGNOSIS — M1990 Unspecified osteoarthritis, unspecified site: Secondary | ICD-10-CM | POA: Diagnosis not present

## 2019-12-15 DIAGNOSIS — M8580 Other specified disorders of bone density and structure, unspecified site: Secondary | ICD-10-CM | POA: Diagnosis not present

## 2019-12-15 DIAGNOSIS — G4700 Insomnia, unspecified: Secondary | ICD-10-CM | POA: Diagnosis not present

## 2019-12-15 DIAGNOSIS — J4520 Mild intermittent asthma, uncomplicated: Secondary | ICD-10-CM | POA: Diagnosis not present

## 2019-12-15 DIAGNOSIS — Z9181 History of falling: Secondary | ICD-10-CM | POA: Diagnosis not present

## 2019-12-15 DIAGNOSIS — I10 Essential (primary) hypertension: Secondary | ICD-10-CM | POA: Diagnosis not present

## 2019-12-24 ENCOUNTER — Other Ambulatory Visit: Payer: Self-pay | Admitting: *Deleted

## 2019-12-24 ENCOUNTER — Telehealth: Payer: Self-pay | Admitting: *Deleted

## 2019-12-24 DIAGNOSIS — Z9181 History of falling: Secondary | ICD-10-CM | POA: Diagnosis not present

## 2019-12-24 DIAGNOSIS — E785 Hyperlipidemia, unspecified: Secondary | ICD-10-CM | POA: Diagnosis not present

## 2019-12-24 DIAGNOSIS — G894 Chronic pain syndrome: Secondary | ICD-10-CM | POA: Diagnosis not present

## 2019-12-24 DIAGNOSIS — I10 Essential (primary) hypertension: Secondary | ICD-10-CM | POA: Diagnosis not present

## 2019-12-24 DIAGNOSIS — G4700 Insomnia, unspecified: Secondary | ICD-10-CM | POA: Diagnosis not present

## 2019-12-24 DIAGNOSIS — G8929 Other chronic pain: Secondary | ICD-10-CM

## 2019-12-24 DIAGNOSIS — M8580 Other specified disorders of bone density and structure, unspecified site: Secondary | ICD-10-CM | POA: Diagnosis not present

## 2019-12-24 DIAGNOSIS — S72011D Unspecified intracapsular fracture of right femur, subsequent encounter for closed fracture with routine healing: Secondary | ICD-10-CM | POA: Diagnosis not present

## 2019-12-24 DIAGNOSIS — M79605 Pain in left leg: Secondary | ICD-10-CM

## 2019-12-24 DIAGNOSIS — H9190 Unspecified hearing loss, unspecified ear: Secondary | ICD-10-CM | POA: Diagnosis not present

## 2019-12-24 DIAGNOSIS — R569 Unspecified convulsions: Secondary | ICD-10-CM | POA: Diagnosis not present

## 2019-12-24 DIAGNOSIS — M1990 Unspecified osteoarthritis, unspecified site: Secondary | ICD-10-CM | POA: Diagnosis not present

## 2019-12-24 DIAGNOSIS — M5136 Other intervertebral disc degeneration, lumbar region: Secondary | ICD-10-CM | POA: Diagnosis not present

## 2019-12-24 DIAGNOSIS — K219 Gastro-esophageal reflux disease without esophagitis: Secondary | ICD-10-CM | POA: Diagnosis not present

## 2019-12-24 DIAGNOSIS — J4520 Mild intermittent asthma, uncomplicated: Secondary | ICD-10-CM | POA: Diagnosis not present

## 2019-12-24 DIAGNOSIS — Z87820 Personal history of traumatic brain injury: Secondary | ICD-10-CM | POA: Diagnosis not present

## 2019-12-24 DIAGNOSIS — M533 Sacrococcygeal disorders, not elsewhere classified: Secondary | ICD-10-CM | POA: Diagnosis not present

## 2019-12-24 DIAGNOSIS — Z96641 Presence of right artificial hip joint: Secondary | ICD-10-CM | POA: Diagnosis not present

## 2019-12-24 DIAGNOSIS — Z87891 Personal history of nicotine dependence: Secondary | ICD-10-CM | POA: Diagnosis not present

## 2019-12-24 DIAGNOSIS — Z7901 Long term (current) use of anticoagulants: Secondary | ICD-10-CM | POA: Diagnosis not present

## 2019-12-24 DIAGNOSIS — J302 Other seasonal allergic rhinitis: Secondary | ICD-10-CM | POA: Diagnosis not present

## 2019-12-24 DIAGNOSIS — G2581 Restless legs syndrome: Secondary | ICD-10-CM | POA: Diagnosis not present

## 2019-12-24 DIAGNOSIS — Z1211 Encounter for screening for malignant neoplasm of colon: Secondary | ICD-10-CM

## 2019-12-24 DIAGNOSIS — R208 Other disturbances of skin sensation: Secondary | ICD-10-CM | POA: Diagnosis not present

## 2019-12-24 DIAGNOSIS — G252 Other specified forms of tremor: Secondary | ICD-10-CM | POA: Diagnosis not present

## 2019-12-24 NOTE — Telephone Encounter (Signed)
Pt calls and states that he is supposed to have a test @ 48.  He thinks its like a colonoscopy but states it is done here.  To PCP.  Christen Bame, CMA

## 2019-12-27 ENCOUNTER — Other Ambulatory Visit: Payer: Self-pay | Admitting: Orthopaedic Surgery

## 2019-12-27 ENCOUNTER — Encounter: Payer: Self-pay | Admitting: Gastroenterology

## 2019-12-27 MED ORDER — HYDROCODONE-ACETAMINOPHEN 7.5-325 MG PO TABS: 1.0000 | ORAL_TABLET | Freq: Four times a day (QID) | ORAL | 0 refills | Status: DC | PRN

## 2019-12-27 NOTE — Telephone Encounter (Signed)
Needs for 6 weeks po only

## 2019-12-27 NOTE — Telephone Encounter (Signed)
Please let Mr Richard Glass know that a referral for his screening colonoscopy was made.

## 2019-12-27 NOTE — Telephone Encounter (Signed)
Patient informed.  Vita Currin,CMA  

## 2019-12-28 DIAGNOSIS — J4520 Mild intermittent asthma, uncomplicated: Secondary | ICD-10-CM | POA: Diagnosis not present

## 2019-12-28 DIAGNOSIS — H9190 Unspecified hearing loss, unspecified ear: Secondary | ICD-10-CM | POA: Diagnosis not present

## 2019-12-28 DIAGNOSIS — Z87891 Personal history of nicotine dependence: Secondary | ICD-10-CM | POA: Diagnosis not present

## 2019-12-28 DIAGNOSIS — E785 Hyperlipidemia, unspecified: Secondary | ICD-10-CM | POA: Diagnosis not present

## 2019-12-28 DIAGNOSIS — Z9181 History of falling: Secondary | ICD-10-CM | POA: Diagnosis not present

## 2019-12-28 DIAGNOSIS — J302 Other seasonal allergic rhinitis: Secondary | ICD-10-CM | POA: Diagnosis not present

## 2019-12-28 DIAGNOSIS — R569 Unspecified convulsions: Secondary | ICD-10-CM | POA: Diagnosis not present

## 2019-12-28 DIAGNOSIS — G2581 Restless legs syndrome: Secondary | ICD-10-CM | POA: Diagnosis not present

## 2019-12-28 DIAGNOSIS — M5136 Other intervertebral disc degeneration, lumbar region: Secondary | ICD-10-CM | POA: Diagnosis not present

## 2019-12-28 DIAGNOSIS — G252 Other specified forms of tremor: Secondary | ICD-10-CM | POA: Diagnosis not present

## 2019-12-28 DIAGNOSIS — Z87820 Personal history of traumatic brain injury: Secondary | ICD-10-CM | POA: Diagnosis not present

## 2019-12-28 DIAGNOSIS — M1990 Unspecified osteoarthritis, unspecified site: Secondary | ICD-10-CM | POA: Diagnosis not present

## 2019-12-28 DIAGNOSIS — G4700 Insomnia, unspecified: Secondary | ICD-10-CM | POA: Diagnosis not present

## 2019-12-28 DIAGNOSIS — S72011D Unspecified intracapsular fracture of right femur, subsequent encounter for closed fracture with routine healing: Secondary | ICD-10-CM | POA: Diagnosis not present

## 2019-12-28 DIAGNOSIS — G894 Chronic pain syndrome: Secondary | ICD-10-CM | POA: Diagnosis not present

## 2019-12-28 DIAGNOSIS — I10 Essential (primary) hypertension: Secondary | ICD-10-CM | POA: Diagnosis not present

## 2019-12-28 DIAGNOSIS — M533 Sacrococcygeal disorders, not elsewhere classified: Secondary | ICD-10-CM | POA: Diagnosis not present

## 2019-12-28 DIAGNOSIS — K219 Gastro-esophageal reflux disease without esophagitis: Secondary | ICD-10-CM | POA: Diagnosis not present

## 2019-12-28 DIAGNOSIS — M8580 Other specified disorders of bone density and structure, unspecified site: Secondary | ICD-10-CM | POA: Diagnosis not present

## 2019-12-28 DIAGNOSIS — Z96641 Presence of right artificial hip joint: Secondary | ICD-10-CM | POA: Diagnosis not present

## 2019-12-28 DIAGNOSIS — Z7901 Long term (current) use of anticoagulants: Secondary | ICD-10-CM | POA: Diagnosis not present

## 2019-12-30 DIAGNOSIS — G252 Other specified forms of tremor: Secondary | ICD-10-CM | POA: Diagnosis not present

## 2019-12-30 DIAGNOSIS — S72011D Unspecified intracapsular fracture of right femur, subsequent encounter for closed fracture with routine healing: Secondary | ICD-10-CM | POA: Diagnosis not present

## 2019-12-30 DIAGNOSIS — H9190 Unspecified hearing loss, unspecified ear: Secondary | ICD-10-CM | POA: Diagnosis not present

## 2019-12-30 DIAGNOSIS — G4700 Insomnia, unspecified: Secondary | ICD-10-CM | POA: Diagnosis not present

## 2019-12-30 DIAGNOSIS — J302 Other seasonal allergic rhinitis: Secondary | ICD-10-CM | POA: Diagnosis not present

## 2019-12-30 DIAGNOSIS — K219 Gastro-esophageal reflux disease without esophagitis: Secondary | ICD-10-CM | POA: Diagnosis not present

## 2019-12-30 DIAGNOSIS — I10 Essential (primary) hypertension: Secondary | ICD-10-CM | POA: Diagnosis not present

## 2019-12-30 DIAGNOSIS — Z87891 Personal history of nicotine dependence: Secondary | ICD-10-CM | POA: Diagnosis not present

## 2019-12-30 DIAGNOSIS — Z87820 Personal history of traumatic brain injury: Secondary | ICD-10-CM | POA: Diagnosis not present

## 2019-12-30 DIAGNOSIS — M5136 Other intervertebral disc degeneration, lumbar region: Secondary | ICD-10-CM | POA: Diagnosis not present

## 2019-12-30 DIAGNOSIS — R569 Unspecified convulsions: Secondary | ICD-10-CM | POA: Diagnosis not present

## 2019-12-30 DIAGNOSIS — Z96641 Presence of right artificial hip joint: Secondary | ICD-10-CM | POA: Diagnosis not present

## 2019-12-30 DIAGNOSIS — M533 Sacrococcygeal disorders, not elsewhere classified: Secondary | ICD-10-CM | POA: Diagnosis not present

## 2019-12-30 DIAGNOSIS — E785 Hyperlipidemia, unspecified: Secondary | ICD-10-CM | POA: Diagnosis not present

## 2019-12-30 DIAGNOSIS — M8580 Other specified disorders of bone density and structure, unspecified site: Secondary | ICD-10-CM | POA: Diagnosis not present

## 2019-12-30 DIAGNOSIS — J4520 Mild intermittent asthma, uncomplicated: Secondary | ICD-10-CM | POA: Diagnosis not present

## 2019-12-30 DIAGNOSIS — M1990 Unspecified osteoarthritis, unspecified site: Secondary | ICD-10-CM | POA: Diagnosis not present

## 2019-12-30 DIAGNOSIS — Z7901 Long term (current) use of anticoagulants: Secondary | ICD-10-CM | POA: Diagnosis not present

## 2019-12-30 DIAGNOSIS — Z9181 History of falling: Secondary | ICD-10-CM | POA: Diagnosis not present

## 2019-12-30 DIAGNOSIS — G894 Chronic pain syndrome: Secondary | ICD-10-CM | POA: Diagnosis not present

## 2019-12-30 DIAGNOSIS — G2581 Restless legs syndrome: Secondary | ICD-10-CM | POA: Diagnosis not present

## 2020-01-04 ENCOUNTER — Ambulatory Visit (INDEPENDENT_AMBULATORY_CARE_PROVIDER_SITE_OTHER): Payer: Medicare Other | Admitting: Orthopaedic Surgery

## 2020-01-04 ENCOUNTER — Other Ambulatory Visit: Payer: Self-pay

## 2020-01-04 ENCOUNTER — Ambulatory Visit (INDEPENDENT_AMBULATORY_CARE_PROVIDER_SITE_OTHER): Payer: Medicare Other

## 2020-01-04 DIAGNOSIS — G2581 Restless legs syndrome: Secondary | ICD-10-CM | POA: Diagnosis not present

## 2020-01-04 DIAGNOSIS — G252 Other specified forms of tremor: Secondary | ICD-10-CM | POA: Diagnosis not present

## 2020-01-04 DIAGNOSIS — Z96641 Presence of right artificial hip joint: Secondary | ICD-10-CM

## 2020-01-04 DIAGNOSIS — Z87820 Personal history of traumatic brain injury: Secondary | ICD-10-CM | POA: Diagnosis not present

## 2020-01-04 DIAGNOSIS — Z7901 Long term (current) use of anticoagulants: Secondary | ICD-10-CM | POA: Diagnosis not present

## 2020-01-04 DIAGNOSIS — E785 Hyperlipidemia, unspecified: Secondary | ICD-10-CM | POA: Diagnosis not present

## 2020-01-04 DIAGNOSIS — Z87891 Personal history of nicotine dependence: Secondary | ICD-10-CM | POA: Diagnosis not present

## 2020-01-04 DIAGNOSIS — M1990 Unspecified osteoarthritis, unspecified site: Secondary | ICD-10-CM | POA: Diagnosis not present

## 2020-01-04 DIAGNOSIS — Z9181 History of falling: Secondary | ICD-10-CM | POA: Diagnosis not present

## 2020-01-04 DIAGNOSIS — M5136 Other intervertebral disc degeneration, lumbar region: Secondary | ICD-10-CM | POA: Diagnosis not present

## 2020-01-04 DIAGNOSIS — M533 Sacrococcygeal disorders, not elsewhere classified: Secondary | ICD-10-CM | POA: Diagnosis not present

## 2020-01-04 DIAGNOSIS — S72011D Unspecified intracapsular fracture of right femur, subsequent encounter for closed fracture with routine healing: Secondary | ICD-10-CM | POA: Diagnosis not present

## 2020-01-04 DIAGNOSIS — K219 Gastro-esophageal reflux disease without esophagitis: Secondary | ICD-10-CM | POA: Diagnosis not present

## 2020-01-04 DIAGNOSIS — G894 Chronic pain syndrome: Secondary | ICD-10-CM | POA: Diagnosis not present

## 2020-01-04 DIAGNOSIS — M8580 Other specified disorders of bone density and structure, unspecified site: Secondary | ICD-10-CM | POA: Diagnosis not present

## 2020-01-04 DIAGNOSIS — J302 Other seasonal allergic rhinitis: Secondary | ICD-10-CM | POA: Diagnosis not present

## 2020-01-04 DIAGNOSIS — I10 Essential (primary) hypertension: Secondary | ICD-10-CM | POA: Diagnosis not present

## 2020-01-04 DIAGNOSIS — H9190 Unspecified hearing loss, unspecified ear: Secondary | ICD-10-CM | POA: Diagnosis not present

## 2020-01-04 DIAGNOSIS — R569 Unspecified convulsions: Secondary | ICD-10-CM | POA: Diagnosis not present

## 2020-01-04 DIAGNOSIS — G4700 Insomnia, unspecified: Secondary | ICD-10-CM | POA: Diagnosis not present

## 2020-01-04 DIAGNOSIS — J4520 Mild intermittent asthma, uncomplicated: Secondary | ICD-10-CM | POA: Diagnosis not present

## 2020-01-04 NOTE — Progress Notes (Signed)
Post-Op Visit Note   Patient: Richard Glass           Date of Birth: 1970-10-22           MRN: [id number] Visit Date: 01/04/2020 PCP: [name], [name], MD   Assessment & Plan:  Chief Complaint:  Chief Complaint  Patient presents with  . Right Hip - Pain, Follow-up   Visit Diagnoses:  1. Status post right hip replacement     Plan: [place] is 6 weeks status post right total hip replacement and removal of cannulated screw.  He is doing well overall.  He continues to work with home health physical therapy.  He is ambulating with a single-point cane.  Surgical scars are all fully healed.  He has some mild swelling in the thigh.  No evidence of infection.  X-rays are unremarkable.  From my standpoint he is progressing appropriately although it is slightly slower than average but he does have some chronic pain issues.  He will continue with PT.  Dental prophylaxis reinforced.  Follow-up in 6 weeks for recheck.  Follow-Up Instructions: Return in about 6 weeks (around 02/15/2020).   Orders:  Orders Placed This Encounter  Procedures  . XR HIP UNILAT W OR W/O PELVIS 2-3 VIEWS RIGHT   No orders of the defined types were placed in this encounter.   Imaging: XR HIP UNILAT W OR W/O PELVIS 2-3 VIEWS RIGHT  Result Date: 01/04/2020 Stable right total hip replacement without complication.   PMFS History: Patient Active Problem List   Diagnosis Date Noted  . Status post total replacement of right hip 11/22/2019  . Painful orthopaedic hardware ([place]) 11/11/2019  . 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 Weight loss, non-intentional 10/14/2019  . Bilateral leg weakness 09/03/2019  . S/P right hip fracture 07/07/2019  . Right leg pain 06/26/2019  . Femoral fracture ([place]) 06/26/2019  . Hearing loss 05/01/2019  . Chronic left leg pain 11/20/2018  . Allodynia, left leg 11/20/2018  . History of traumatic brain injury 02/23/2018  . High risk medication  use, Combination of Opioid and Benzodiazapine 02/12/2018  . 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 SI (sacroiliac) pain 03/20/2017  . Enchondroma of left fibular head 05/17/2016  . Chronic pain syndrome 01/18/2016  . Anxiety state 08/08/2013  . Cerumen impaction 08/06/2013  . Insomnia 02/02/2013  . Right flank pain 05/21/2011  . 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 Encounter for chronic pain management 07/04/2009  . Restless leg 09/29/2007  . Hyperlipidemia 11/13/2006  . Allergic rhinitis 11/13/2006  . GASTROESOPHAGEAL REFLUX, NO ESOPHAGITIS 11/13/2006  . Low back pain 11/13/2006  . Convulsions ([place]) 11/13/2006  . Seizure ([place]) 11/13/2006   Past Medical History:  Diagnosis Date  . 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  . Allergic rhinitis 11/13/2006       . Anxiety   . Anxiety state 08/08/2013  . Asthma   . ASTHMA, INTERMITTENT 07/08/2010   Qualifier: Diagnosis of  By: Walker Kehr MD, Patrick Jupiter    . Back pain of lumbar region with sciatica 11/13/2006   MRI 12/2014 - lumbar DDD without focal neural impingement  MRI Lumbar 10/30/16 ([place]) Small central L5-S1 disc extrusion with minimal cranial migration and associated annular fissure approaches descending left S1 nerve roots without contact or displacement. Minimal bulging disc height loss without spinal canal stenosis or neural foraminal narrowing.  Minimal bulging disc at L2-L3 through L4-L5 without spinal canal stenosis or neural foraminal narrowing.  MRI Sacrum 10/30/16 ([place]) No fracture. Small lesion left sacral ala most likely a subchondral cyst/erosion adjacent to the sacroiliac joint.     . 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 Carpal tunnel syndrome 01/09/2015   Left 12/2014   . Cervical spine pain 02/06/2015   MRI 03/2015 FINDINGS: Vertebral body height, signal and  alignment are normal. The craniocervical junction is normal and cervical cord signal is normal. The central spinal canal and neural foramina are widely patent at all levels. Scattered, mild degenerative change appears most notable at C4-5. Imaged paraspinous structures are unremarkable.  IMPRESSION: Negative for central canal or foraminal narrowing. No finding to explain the patient's symptoms. Scattered, mild facet degenerative disease is noted.   . Chronic low back pain   . Chronic pain 01/18/2016  . Chronic pain syndrome 01/18/2016  . Coccyx pain 02/06/2015  . Contact dermatitis or eczema 02/13/2018  . Convulsions ([place]) 11/13/2006     Cystic encephalomalacia left temporal and parietal regions from remote injury.   . Degenerative arthritis   . Dyslipidemia   . External hemorrhoid, bleeding 06/04/2011  . GASTROESOPHAGEAL REFLUX, NO ESOPHAGITIS 11/13/2006   Qualifier: Diagnosis of  By: Eusebio Friendly    . Hamstring muscle strain, left, initial encounter 06/26/2018  . Headache(784.0)   . Hyperlipidemia 11/13/2006   07/2016  ASCVD score of 4.7% - recommended lifestyle changes   . Insomnia 02/02/2013  . Intention tremor 10/31/2009   Qualifier: Diagnosis of  By: Walker Kehr MD, Patrick Jupiter    . Intercostal pain 04/25/2015  . Left foot pain 08/21/2018  . Left knee injury 05/09/2016  . 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 Morton's neuroma of left foot 01/18/2016  . Osteopenia 10/10/2016  . Overweight(278.02) 06/13/2009   Qualifier: Diagnosis of  By: Hedy Camara    . Pain in joint, shoulder region 07/05/2014   LEFT > Right Reports hx rotator cuff surgery on rigt shoulder previously (Dr Nicholes Stairs) but no notes available XRAY right shoulder showed some proliferative changes distal rigt clavicle   . Rash and nonspecific skin eruption 10/10/2016  . Restless leg 09/29/2007   Qualifier: Diagnosis of  By: Walker Kehr MD, Patrick Jupiter    . Restless legs syndrome (RLS)   . Sciatica of left side 10/26/2014  . Seizures ([place])    last >23yrs  . SI  (sacroiliac) pain 03/20/2017  . Sinusitis chronic, frontal   . Status post lumbar spinal fusion 03/20/2017  . Subacromial or subdeltoid bursitis 08/08/2009   MRI C-spine 2011([facility]) - normal MRI left shoulder 10/2010 ([facility]) - AC degenerative disease, normal rotator cuff 12/2012 -debridement, acromioplasty and distal clavicle excision of left shoulder, arthroscopy also performed an no need for rotator cuff repair - performed by [facility] 02/2013 - Nerve Conduction Studies ([facility]) - ulnar nerve compression 02/2013- taken back to OR for Ulnar nerve decompression 06/2013 Repeat MRI left shoulder - subacromial/subdeltoid bursitis, a small intersubstance tear to the distal infraspinatus and evidence of interval resection of the distal clavicle 06/2013 - steroid injection of left biceps 09/2013 -Repeat EMG showed improvement of ulnar nerve compression 10/2013 - referred to [facility] for second opinion due to intractable pain 11/2013 - Seen by Dr. Marlou Sa at [facility]; discussed risks/benefits of surgical intervention and bursectomy, no plan for surgery at this time     . 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 Cancer Father        lung  . Heart disease Father   . Hypertension Mother   . Seizures Neg Hx     Past Surgical History:  Procedure Laterality Date  . [place]   struck by golf ball-49 yrs old  . CRANIOTOMY     metal plate  . HARDWARE REMOVAL Right 11/22/2019   Procedure: HARDWARE REMOVAL;  Surgeon: Leandrew Koyanagi, MD;  Location: [place];  Service: Orthopedics;  Laterality: Right;  . HIP PINNING,CANNULATED Right 06/27/2019   Procedure: HIP PERCUTANEOUS PINNING;  Surgeon: Meredith Pel, MD;  Location: [place];  Service: Orthopedics;  Laterality: Right;  . NASAL FRACTURE SURGERY  1997  . SACROILIAC  JOINT FUSION Left 03/20/2017   Procedure: SACROILIAC JOINT FUSION;  Surgeon: Melina Schools, MD;  Location: [place];  Service: Orthopedics;  Laterality: Left;  90 mins  . SHOULDER ARTHROSCOPY Left    "cleaned arthritis out"  . SHOULDER SURGERY Right 2012   Rotator cuff surgery  . TONSILLECTOMY    . TOTAL HIP ARTHROPLASTY Right 11/22/2019   Procedure: CONVERSION OF PREVIOUS RIGHT HIP SURGERY TO TOTAL HIP ARTHROPLASTY ANTERIOR APPROACH, HARDWARE REMOVAL;  Surgeon: Leandrew Koyanagi, MD;  Location: [place];  Service: Orthopedics;  Laterality: Right;  . UVULOPALATOPHARYNGOPLASTY (UPPP)/TONSILLECTOMY/SEPTOPLASTY  2001   Social History   Occupational History  . Occupation: disabled  Tobacco Use  . Smoking status: Former Smoker    Packs/day: 1.00    Years: 22.00    Pack years: 22.00    Types: Cigarettes    Quit date: 06/22/2012    Years since quitting: 7.5  . Smokeless tobacco: Current User    Types: Snuff  . Tobacco comment: per patient uses "dip" about twice a day   Substance and Sexual Activity  . Alcohol use: No    Alcohol/week: 0.0 standard drinks    Comment: none since 7/11  . Drug use: No  . Sexual activity: Never

## 2020-01-06 DIAGNOSIS — S72011D Unspecified intracapsular fracture of right femur, subsequent encounter for closed fracture with routine healing: Secondary | ICD-10-CM | POA: Diagnosis not present

## 2020-01-06 DIAGNOSIS — Z7901 Long term (current) use of anticoagulants: Secondary | ICD-10-CM | POA: Diagnosis not present

## 2020-01-06 DIAGNOSIS — G4700 Insomnia, unspecified: Secondary | ICD-10-CM | POA: Diagnosis not present

## 2020-01-06 DIAGNOSIS — Z87891 Personal history of nicotine dependence: Secondary | ICD-10-CM | POA: Diagnosis not present

## 2020-01-06 DIAGNOSIS — G2581 Restless legs syndrome: Secondary | ICD-10-CM | POA: Diagnosis not present

## 2020-01-06 DIAGNOSIS — M533 Sacrococcygeal disorders, not elsewhere classified: Secondary | ICD-10-CM | POA: Diagnosis not present

## 2020-01-06 DIAGNOSIS — J4520 Mild intermittent asthma, uncomplicated: Secondary | ICD-10-CM | POA: Diagnosis not present

## 2020-01-06 DIAGNOSIS — E785 Hyperlipidemia, unspecified: Secondary | ICD-10-CM | POA: Diagnosis not present

## 2020-01-06 DIAGNOSIS — Z87820 Personal history of traumatic brain injury: Secondary | ICD-10-CM | POA: Diagnosis not present

## 2020-01-06 DIAGNOSIS — R569 Unspecified convulsions: Secondary | ICD-10-CM | POA: Diagnosis not present

## 2020-01-06 DIAGNOSIS — I10 Essential (primary) hypertension: Secondary | ICD-10-CM | POA: Diagnosis not present

## 2020-01-06 DIAGNOSIS — M5136 Other intervertebral disc degeneration, lumbar region: Secondary | ICD-10-CM | POA: Diagnosis not present

## 2020-01-06 DIAGNOSIS — M1990 Unspecified osteoarthritis, unspecified site: Secondary | ICD-10-CM | POA: Diagnosis not present

## 2020-01-06 DIAGNOSIS — M8580 Other specified disorders of bone density and structure, unspecified site: Secondary | ICD-10-CM | POA: Diagnosis not present

## 2020-01-06 DIAGNOSIS — Z96641 Presence of right artificial hip joint: Secondary | ICD-10-CM | POA: Diagnosis not present

## 2020-01-06 DIAGNOSIS — Z9181 History of falling: Secondary | ICD-10-CM | POA: Diagnosis not present

## 2020-01-06 DIAGNOSIS — H9190 Unspecified hearing loss, unspecified ear: Secondary | ICD-10-CM | POA: Diagnosis not present

## 2020-01-06 DIAGNOSIS — G894 Chronic pain syndrome: Secondary | ICD-10-CM | POA: Diagnosis not present

## 2020-01-06 DIAGNOSIS — J302 Other seasonal allergic rhinitis: Secondary | ICD-10-CM | POA: Diagnosis not present

## 2020-01-06 DIAGNOSIS — G252 Other specified forms of tremor: Secondary | ICD-10-CM | POA: Diagnosis not present

## 2020-01-06 DIAGNOSIS — K219 Gastro-esophageal reflux disease without esophagitis: Secondary | ICD-10-CM | POA: Diagnosis not present

## 2020-01-11 DIAGNOSIS — M8580 Other specified disorders of bone density and structure, unspecified site: Secondary | ICD-10-CM | POA: Diagnosis not present

## 2020-01-11 DIAGNOSIS — M1990 Unspecified osteoarthritis, unspecified site: Secondary | ICD-10-CM | POA: Diagnosis not present

## 2020-01-11 DIAGNOSIS — G2581 Restless legs syndrome: Secondary | ICD-10-CM | POA: Diagnosis not present

## 2020-01-11 DIAGNOSIS — R569 Unspecified convulsions: Secondary | ICD-10-CM | POA: Diagnosis not present

## 2020-01-11 DIAGNOSIS — G252 Other specified forms of tremor: Secondary | ICD-10-CM | POA: Diagnosis not present

## 2020-01-11 DIAGNOSIS — Z87891 Personal history of nicotine dependence: Secondary | ICD-10-CM | POA: Diagnosis not present

## 2020-01-11 DIAGNOSIS — E785 Hyperlipidemia, unspecified: Secondary | ICD-10-CM | POA: Diagnosis not present

## 2020-01-11 DIAGNOSIS — K219 Gastro-esophageal reflux disease without esophagitis: Secondary | ICD-10-CM | POA: Diagnosis not present

## 2020-01-11 DIAGNOSIS — M5136 Other intervertebral disc degeneration, lumbar region: Secondary | ICD-10-CM | POA: Diagnosis not present

## 2020-01-11 DIAGNOSIS — Z96641 Presence of right artificial hip joint: Secondary | ICD-10-CM | POA: Diagnosis not present

## 2020-01-11 DIAGNOSIS — G894 Chronic pain syndrome: Secondary | ICD-10-CM | POA: Diagnosis not present

## 2020-01-11 DIAGNOSIS — G4700 Insomnia, unspecified: Secondary | ICD-10-CM | POA: Diagnosis not present

## 2020-01-11 DIAGNOSIS — I10 Essential (primary) hypertension: Secondary | ICD-10-CM | POA: Diagnosis not present

## 2020-01-11 DIAGNOSIS — H9190 Unspecified hearing loss, unspecified ear: Secondary | ICD-10-CM | POA: Diagnosis not present

## 2020-01-11 DIAGNOSIS — Z9181 History of falling: Secondary | ICD-10-CM | POA: Diagnosis not present

## 2020-01-11 DIAGNOSIS — Z7901 Long term (current) use of anticoagulants: Secondary | ICD-10-CM | POA: Diagnosis not present

## 2020-01-11 DIAGNOSIS — J302 Other seasonal allergic rhinitis: Secondary | ICD-10-CM | POA: Diagnosis not present

## 2020-01-11 DIAGNOSIS — J4520 Mild intermittent asthma, uncomplicated: Secondary | ICD-10-CM | POA: Diagnosis not present

## 2020-01-11 DIAGNOSIS — Z87820 Personal history of traumatic brain injury: Secondary | ICD-10-CM | POA: Diagnosis not present

## 2020-01-11 DIAGNOSIS — S72011D Unspecified intracapsular fracture of right femur, subsequent encounter for closed fracture with routine healing: Secondary | ICD-10-CM | POA: Diagnosis not present

## 2020-01-11 DIAGNOSIS — M533 Sacrococcygeal disorders, not elsewhere classified: Secondary | ICD-10-CM | POA: Diagnosis not present

## 2020-01-13 ENCOUNTER — Ambulatory Visit (AMBULATORY_SURGERY_CENTER): Payer: Self-pay

## 2020-01-13 ENCOUNTER — Other Ambulatory Visit: Payer: Self-pay

## 2020-01-13 VITALS — Temp 97.5°F | Ht 68.0 in | Wt 140.0 lb

## 2020-01-13 DIAGNOSIS — Z87820 Personal history of traumatic brain injury: Secondary | ICD-10-CM | POA: Diagnosis not present

## 2020-01-13 DIAGNOSIS — Z87891 Personal history of nicotine dependence: Secondary | ICD-10-CM | POA: Diagnosis not present

## 2020-01-13 DIAGNOSIS — H9190 Unspecified hearing loss, unspecified ear: Secondary | ICD-10-CM | POA: Diagnosis not present

## 2020-01-13 DIAGNOSIS — M5136 Other intervertebral disc degeneration, lumbar region: Secondary | ICD-10-CM | POA: Diagnosis not present

## 2020-01-13 DIAGNOSIS — E785 Hyperlipidemia, unspecified: Secondary | ICD-10-CM | POA: Diagnosis not present

## 2020-01-13 DIAGNOSIS — G2581 Restless legs syndrome: Secondary | ICD-10-CM | POA: Diagnosis not present

## 2020-01-13 DIAGNOSIS — Z7901 Long term (current) use of anticoagulants: Secondary | ICD-10-CM | POA: Diagnosis not present

## 2020-01-13 DIAGNOSIS — Z9181 History of falling: Secondary | ICD-10-CM | POA: Diagnosis not present

## 2020-01-13 DIAGNOSIS — R569 Unspecified convulsions: Secondary | ICD-10-CM | POA: Diagnosis not present

## 2020-01-13 DIAGNOSIS — I10 Essential (primary) hypertension: Secondary | ICD-10-CM | POA: Diagnosis not present

## 2020-01-13 DIAGNOSIS — Z1211 Encounter for screening for malignant neoplasm of colon: Secondary | ICD-10-CM

## 2020-01-13 DIAGNOSIS — J302 Other seasonal allergic rhinitis: Secondary | ICD-10-CM | POA: Diagnosis not present

## 2020-01-13 DIAGNOSIS — M8580 Other specified disorders of bone density and structure, unspecified site: Secondary | ICD-10-CM | POA: Diagnosis not present

## 2020-01-13 DIAGNOSIS — G894 Chronic pain syndrome: Secondary | ICD-10-CM | POA: Diagnosis not present

## 2020-01-13 DIAGNOSIS — G4700 Insomnia, unspecified: Secondary | ICD-10-CM | POA: Diagnosis not present

## 2020-01-13 DIAGNOSIS — M533 Sacrococcygeal disorders, not elsewhere classified: Secondary | ICD-10-CM | POA: Diagnosis not present

## 2020-01-13 DIAGNOSIS — M1990 Unspecified osteoarthritis, unspecified site: Secondary | ICD-10-CM | POA: Diagnosis not present

## 2020-01-13 DIAGNOSIS — G252 Other specified forms of tremor: Secondary | ICD-10-CM | POA: Diagnosis not present

## 2020-01-13 DIAGNOSIS — K219 Gastro-esophageal reflux disease without esophagitis: Secondary | ICD-10-CM | POA: Diagnosis not present

## 2020-01-13 DIAGNOSIS — Z96641 Presence of right artificial hip joint: Secondary | ICD-10-CM | POA: Diagnosis not present

## 2020-01-13 DIAGNOSIS — J4520 Mild intermittent asthma, uncomplicated: Secondary | ICD-10-CM | POA: Diagnosis not present

## 2020-01-13 DIAGNOSIS — S72011D Unspecified intracapsular fracture of right femur, subsequent encounter for closed fracture with routine healing: Secondary | ICD-10-CM | POA: Diagnosis not present

## 2020-01-13 MED ORDER — SUTAB 1479-225-188 MG PO TABS: 1.0000 | ORAL_TABLET | ORAL | 0 refills | Status: DC

## 2020-01-13 MED ORDER — SUTAB 1479-225-188 MG PO TABS: 12.0000 | ORAL_TABLET | ORAL | 0 refills | Status: DC

## 2020-01-13 NOTE — Addendum Note (Signed)
Addended by: Levonne Spiller on: 01/13/2020 02:27 PM   Modules accepted: Orders

## 2020-01-13 NOTE — Progress Notes (Signed)
No allergies to soy or egg Pt is not on blood thinners or diet pills Denies issues with sedation/intubation Denies atrial flutter/fib Denies constipation   Emmi instructions given to pt  Pt is aware of Covid safety and care partner requirements.  

## 2020-01-18 DIAGNOSIS — R569 Unspecified convulsions: Secondary | ICD-10-CM | POA: Diagnosis not present

## 2020-01-18 DIAGNOSIS — Z87820 Personal history of traumatic brain injury: Secondary | ICD-10-CM | POA: Diagnosis not present

## 2020-01-18 DIAGNOSIS — M5136 Other intervertebral disc degeneration, lumbar region: Secondary | ICD-10-CM | POA: Diagnosis not present

## 2020-01-18 DIAGNOSIS — Z96641 Presence of right artificial hip joint: Secondary | ICD-10-CM | POA: Diagnosis not present

## 2020-01-18 DIAGNOSIS — K219 Gastro-esophageal reflux disease without esophagitis: Secondary | ICD-10-CM | POA: Diagnosis not present

## 2020-01-18 DIAGNOSIS — H9190 Unspecified hearing loss, unspecified ear: Secondary | ICD-10-CM | POA: Diagnosis not present

## 2020-01-18 DIAGNOSIS — M8580 Other specified disorders of bone density and structure, unspecified site: Secondary | ICD-10-CM | POA: Diagnosis not present

## 2020-01-18 DIAGNOSIS — Z87891 Personal history of nicotine dependence: Secondary | ICD-10-CM | POA: Diagnosis not present

## 2020-01-18 DIAGNOSIS — E785 Hyperlipidemia, unspecified: Secondary | ICD-10-CM | POA: Diagnosis not present

## 2020-01-18 DIAGNOSIS — S72011D Unspecified intracapsular fracture of right femur, subsequent encounter for closed fracture with routine healing: Secondary | ICD-10-CM | POA: Diagnosis not present

## 2020-01-18 DIAGNOSIS — G252 Other specified forms of tremor: Secondary | ICD-10-CM | POA: Diagnosis not present

## 2020-01-18 DIAGNOSIS — J4520 Mild intermittent asthma, uncomplicated: Secondary | ICD-10-CM | POA: Diagnosis not present

## 2020-01-18 DIAGNOSIS — I10 Essential (primary) hypertension: Secondary | ICD-10-CM | POA: Diagnosis not present

## 2020-01-18 DIAGNOSIS — G894 Chronic pain syndrome: Secondary | ICD-10-CM | POA: Diagnosis not present

## 2020-01-18 DIAGNOSIS — M533 Sacrococcygeal disorders, not elsewhere classified: Secondary | ICD-10-CM | POA: Diagnosis not present

## 2020-01-18 DIAGNOSIS — G4700 Insomnia, unspecified: Secondary | ICD-10-CM | POA: Diagnosis not present

## 2020-01-18 DIAGNOSIS — J302 Other seasonal allergic rhinitis: Secondary | ICD-10-CM | POA: Diagnosis not present

## 2020-01-18 DIAGNOSIS — G2581 Restless legs syndrome: Secondary | ICD-10-CM | POA: Diagnosis not present

## 2020-01-18 DIAGNOSIS — Z9181 History of falling: Secondary | ICD-10-CM | POA: Diagnosis not present

## 2020-01-18 DIAGNOSIS — M1990 Unspecified osteoarthritis, unspecified site: Secondary | ICD-10-CM | POA: Diagnosis not present

## 2020-01-18 DIAGNOSIS — Z7901 Long term (current) use of anticoagulants: Secondary | ICD-10-CM | POA: Diagnosis not present

## 2020-01-20 DIAGNOSIS — M8580 Other specified disorders of bone density and structure, unspecified site: Secondary | ICD-10-CM | POA: Diagnosis not present

## 2020-01-20 DIAGNOSIS — Z9181 History of falling: Secondary | ICD-10-CM | POA: Diagnosis not present

## 2020-01-20 DIAGNOSIS — S72011D Unspecified intracapsular fracture of right femur, subsequent encounter for closed fracture with routine healing: Secondary | ICD-10-CM | POA: Diagnosis not present

## 2020-01-20 DIAGNOSIS — J302 Other seasonal allergic rhinitis: Secondary | ICD-10-CM | POA: Diagnosis not present

## 2020-01-20 DIAGNOSIS — I10 Essential (primary) hypertension: Secondary | ICD-10-CM | POA: Diagnosis not present

## 2020-01-20 DIAGNOSIS — M5136 Other intervertebral disc degeneration, lumbar region: Secondary | ICD-10-CM | POA: Diagnosis not present

## 2020-01-20 DIAGNOSIS — H9190 Unspecified hearing loss, unspecified ear: Secondary | ICD-10-CM | POA: Diagnosis not present

## 2020-01-20 DIAGNOSIS — G894 Chronic pain syndrome: Secondary | ICD-10-CM | POA: Diagnosis not present

## 2020-01-20 DIAGNOSIS — J4520 Mild intermittent asthma, uncomplicated: Secondary | ICD-10-CM | POA: Diagnosis not present

## 2020-01-20 DIAGNOSIS — G4700 Insomnia, unspecified: Secondary | ICD-10-CM | POA: Diagnosis not present

## 2020-01-20 DIAGNOSIS — G2581 Restless legs syndrome: Secondary | ICD-10-CM | POA: Diagnosis not present

## 2020-01-20 DIAGNOSIS — Z96641 Presence of right artificial hip joint: Secondary | ICD-10-CM | POA: Diagnosis not present

## 2020-01-20 DIAGNOSIS — Z7901 Long term (current) use of anticoagulants: Secondary | ICD-10-CM | POA: Diagnosis not present

## 2020-01-20 DIAGNOSIS — M533 Sacrococcygeal disorders, not elsewhere classified: Secondary | ICD-10-CM | POA: Diagnosis not present

## 2020-01-20 DIAGNOSIS — Z87820 Personal history of traumatic brain injury: Secondary | ICD-10-CM | POA: Diagnosis not present

## 2020-01-20 DIAGNOSIS — K219 Gastro-esophageal reflux disease without esophagitis: Secondary | ICD-10-CM | POA: Diagnosis not present

## 2020-01-20 DIAGNOSIS — E785 Hyperlipidemia, unspecified: Secondary | ICD-10-CM | POA: Diagnosis not present

## 2020-01-20 DIAGNOSIS — Z87891 Personal history of nicotine dependence: Secondary | ICD-10-CM | POA: Diagnosis not present

## 2020-01-20 DIAGNOSIS — G252 Other specified forms of tremor: Secondary | ICD-10-CM | POA: Diagnosis not present

## 2020-01-20 DIAGNOSIS — R569 Unspecified convulsions: Secondary | ICD-10-CM | POA: Diagnosis not present

## 2020-01-20 DIAGNOSIS — M1990 Unspecified osteoarthritis, unspecified site: Secondary | ICD-10-CM | POA: Diagnosis not present

## 2020-01-24 ENCOUNTER — Ambulatory Visit (AMBULATORY_SURGERY_CENTER): Payer: Medicare Other | Admitting: Gastroenterology

## 2020-01-24 ENCOUNTER — Encounter: Payer: Self-pay | Admitting: Gastroenterology

## 2020-01-24 ENCOUNTER — Other Ambulatory Visit: Payer: Self-pay | Admitting: *Deleted

## 2020-01-24 ENCOUNTER — Other Ambulatory Visit: Payer: Self-pay

## 2020-01-24 VITALS — BP 123/71 | HR 52 | Temp 95.5°F | Resp 10 | Ht 68.0 in | Wt 140.0 lb

## 2020-01-24 DIAGNOSIS — D120 Benign neoplasm of cecum: Secondary | ICD-10-CM

## 2020-01-24 DIAGNOSIS — M79605 Pain in left leg: Secondary | ICD-10-CM

## 2020-01-24 DIAGNOSIS — Z1211 Encounter for screening for malignant neoplasm of colon: Secondary | ICD-10-CM

## 2020-01-24 DIAGNOSIS — D125 Benign neoplasm of sigmoid colon: Secondary | ICD-10-CM | POA: Diagnosis not present

## 2020-01-24 DIAGNOSIS — G8929 Other chronic pain: Secondary | ICD-10-CM

## 2020-01-24 DIAGNOSIS — M533 Sacrococcygeal disorders, not elsewhere classified: Secondary | ICD-10-CM

## 2020-01-24 DIAGNOSIS — G894 Chronic pain syndrome: Secondary | ICD-10-CM

## 2020-01-24 MED ORDER — SODIUM CHLORIDE 0.9 % IV SOLN: 500.0000 mL | INTRAVENOUS | Status: DC

## 2020-01-24 MED ORDER — HYDROCODONE-ACETAMINOPHEN 7.5-325 MG PO TABS: 1.0000 | ORAL_TABLET | Freq: Four times a day (QID) | ORAL | 0 refills | Status: AC | PRN

## 2020-01-24 NOTE — Op Note (Signed)
[place] Patient Name: Richard Glass Procedure Date: 01/24/2020 1:26 PM MRN: [id number] Endoscopist: Remo Lipps P. Havery Moros , MD Age: 49 Referring MD:  Date of Birth: 1971/06/11 Gender: Male Account #: [id number] Procedure:                Colonoscopy Indications:              Screening for colorectal malignant neoplasm, This                            is the patient's first colonoscopy Medicines:                Monitored Anesthesia Care Procedure:                Pre-Anesthesia Assessment:                           - Prior to the procedure, a History and Physical                            was performed, and patient medications and                            allergies were reviewed. The patient's tolerance of                            previous anesthesia was also reviewed. The risks                            and benefits of the procedure and the sedation                            options and risks were discussed with the patient.                            All questions were answered, and informed consent                            was obtained. Prior Anticoagulants: The patient has                            taken no previous anticoagulant or antiplatelet                            agents. ASA Grade Assessment: III - A patient with                            severe systemic disease. After reviewing the risks                            and benefits, the patient was deemed in                            satisfactory condition to undergo the procedure.  After obtaining informed consent, the colonoscope                            was passed under direct vision. Throughout the                            procedure, the patient's blood pressure, pulse, and                            oxygen saturations were monitored continuously. The                            Colonoscope was introduced through the anus and                            advanced to the the  cecum, identified by                            appendiceal orifice and ileocecal valve. The                            colonoscopy was performed without difficulty. The                            patient tolerated the procedure well. The quality                            of the bowel preparation was good. The ileocecal                            valve, appendiceal orifice, and rectum were                            photographed. Scope In: 1:28:32 PM Scope Out: 1:46:32 PM Scope Withdrawal Time: 0 hours 15 minutes 7 seconds  Total Procedure Duration: 0 hours 18 minutes 0 seconds  Findings:                 The perianal and digital rectal examinations were                            normal.                           A 4 mm polyp was found in the cecum. The polyp was                            sessile. The polyp was removed with a cold snare.                            Resection and retrieval were complete.                           A 3 mm polyp was found in the sigmoid colon. The  polyp was sessile. The polyp was removed with a                            cold snare. Resection and retrieval were complete.                           Multiple medium-mouthed diverticula were found in                            the transverse colon and left colon.                           Internal hemorrhoids were found during                            retroflexion. The hemorrhoids were small.                           The exam was otherwise without abnormality. Complications:            No immediate complications. Estimated blood loss:                            Minimal. Estimated Blood Loss:     Estimated blood loss was minimal. Impression:               - One 4 mm polyp in the cecum, removed with a cold                            snare. Resected and retrieved.                           - One 3 mm polyp in the sigmoid colon, removed with                            a cold snare.  Resected and retrieved.                           - Diverticulosis in the transverse colon and in the                            left colon.                           - Internal hemorrhoids.                           - The examination was otherwise normal. Recommendation:           - Patient has a contact number available for                            emergencies. The signs and symptoms of potential                            delayed complications were discussed with  the                            patient. Return to normal activities tomorrow.                            Written discharge instructions were provided to the                            patient.                           - Resume previous diet.                           - Continue present medications.                           - Await pathology results. Remo Lipps P. Shanice Poznanski, MD 01/24/2020 1:51:03 PM This report has been signed electronically.

## 2020-01-24 NOTE — Progress Notes (Signed)
Report to PACU, RN, vss, BBS= Clear.  

## 2020-01-24 NOTE — Patient Instructions (Signed)
Handouts on polyps, hemorrhoids, and diverticulosis given to you today  Await pathology results    YOU HAD AN ENDOSCOPIC PROCEDURE TODAY AT THE [facility]:   Refer to the procedure report that was given to you for any specific questions about what was found during the examination.  If the procedure report does not answer your questions, please call your gastroenterologist to clarify.  If you requested that your care partner not be given the details of your procedure findings, then the procedure report has been included in a sealed envelope for you to review at your convenience later.  YOU SHOULD EXPECT: Some feelings of bloating in the abdomen. Passage of more gas than usual.  Walking can help get rid of the air that was put into your GI tract during the procedure and reduce the bloating. If you had a lower endoscopy (such as a colonoscopy or flexible sigmoidoscopy) you may notice spotting of blood in your stool or on the toilet paper. If you underwent a bowel prep for your procedure, you may not have a normal bowel movement for a few days.  Please Note:  You might notice some irritation and congestion in your nose or some drainage.  This is from the oxygen used during your procedure.  There is no need for concern and it should clear up in a day or so.  SYMPTOMS TO REPORT IMMEDIATELY:   Following lower endoscopy (colonoscopy or flexible sigmoidoscopy):  Excessive amounts of blood in the stool  Significant tenderness or worsening of abdominal pains  Swelling of the abdomen that is new, acute  Fever of 100F or higher  For urgent or emergent issues, a gastroenterologist can be reached at any hour by calling [phone]. Do not use MyChart messaging for urgent concerns.    DIET:  We do recommend a small meal at first, but then you may proceed to your regular diet.  Drink plenty of fluids but you should avoid alcoholic beverages for 24 hours.  ACTIVITY:  You should plan to take  it easy for the rest of today and you should NOT DRIVE or use heavy machinery until tomorrow (because of the sedation medicines used during the test).    FOLLOW UP: Our staff will call the number listed on your records 48-72 hours following your procedure to check on you and address any questions or concerns that you may have regarding the information given to you following your procedure. If we do not reach you, we will leave a message.  We will attempt to reach you two times.  During this call, we will ask if you have developed any symptoms of COVID 19. If you develop any symptoms (ie: fever, flu-like symptoms, shortness of breath, cough etc.) before then, please call [phone].  If you test positive for Covid 19 in the 2 weeks post procedure, please call and report this information to us.    If any biopsies were taken you will be contacted by phone or by letter within the next 1-3 weeks.  Please call us at [phone] if you have not heard about the biopsies in 3 weeks.    SIGNATURES/CONFIDENTIALITY: You and/or your care partner have signed paperwork which will be entered into your electronic medical record.  These signatures attest to the fact that that the information above on your After Visit Summary has been reviewed and is understood.  Full responsibility of the confidentiality of this discharge information lies with you and/or your care-partner. 

## 2020-01-26 ENCOUNTER — Telehealth: Payer: Self-pay

## 2020-01-26 NOTE — Telephone Encounter (Signed)
  Follow up Call-  Call back number 01/24/2020  Post procedure Call Back phone  # [phone]  Permission to leave phone message Yes  Some recent data might be hidden     Patient questions:   Do you have a fever, pain , or abdominal swelling? No. Pain Score  0 *  Have you tolerated food without any problems? Yes.    Have you been able to return to your normal activities? Yes.    Do you have any questions about your discharge instructions: Diet   No. Medications  No. Follow up visit  No.  Do you have questions or concerns about your Care? No.  Actions: * If pain score is 4 or above: No action needed, pain <4.  1. Have you developed a fever since your procedure? no  2.   Have you had an respiratory symptoms (SOB or cough) since your procedure? no  3.   Have you tested positive for COVID 19 since your procedure no  4.   Have you had any family members/close contacts diagnosed with the COVID 19 since your procedure?  no   If yes to any of these questions please route to Joylene John, RN and Erenest Rasher, RN

## 2020-01-27 ENCOUNTER — Encounter: Payer: Self-pay | Admitting: Gastroenterology

## 2020-01-31 ENCOUNTER — Encounter: Payer: Self-pay | Admitting: Family Medicine

## 2020-01-31 DIAGNOSIS — D126 Benign neoplasm of colon, unspecified: Secondary | ICD-10-CM

## 2020-01-31 HISTORY — DX: Benign neoplasm of colon, unspecified: D12.6

## 2020-02-01 ENCOUNTER — Other Ambulatory Visit: Payer: Self-pay

## 2020-02-01 ENCOUNTER — Other Ambulatory Visit: Payer: Self-pay | Admitting: Physician Assistant

## 2020-02-01 ENCOUNTER — Ambulatory Visit
Admission: RE | Admit: 2020-02-01 | Discharge: 2020-02-01 | Disposition: A | Discharge status: Home / Self Care | Payer: Medicare Other | Source: Ambulatory Visit | Attending: Physician Assistant | Admitting: Physician Assistant

## 2020-02-01 DIAGNOSIS — G894 Chronic pain syndrome: Secondary | ICD-10-CM | POA: Diagnosis not present

## 2020-02-01 DIAGNOSIS — M25561 Pain in right knee: Secondary | ICD-10-CM | POA: Diagnosis not present

## 2020-02-11 ENCOUNTER — Other Ambulatory Visit: Payer: Self-pay | Admitting: Neurology

## 2020-02-15 ENCOUNTER — Other Ambulatory Visit: Payer: Self-pay

## 2020-02-15 ENCOUNTER — Encounter: Payer: Self-pay | Admitting: Orthopaedic Surgery

## 2020-02-15 ENCOUNTER — Ambulatory Visit (INDEPENDENT_AMBULATORY_CARE_PROVIDER_SITE_OTHER): Payer: Medicare Other | Admitting: Orthopaedic Surgery

## 2020-02-15 DIAGNOSIS — Z96641 Presence of right artificial hip joint: Secondary | ICD-10-CM

## 2020-02-15 NOTE — Progress Notes (Addendum)
Post-Op Visit Note   Patient: Richard Glass           Date of Birth: May 23, 1971           MRN: [id number] Visit Date: 02/15/2020 PCP: [name], [name], MD   Assessment & Plan:  Chief Complaint:  Chief Complaint  Patient presents with  . Right Hip - Follow-up   Visit Diagnoses:  1. Status post right hip replacement     Plan: [name] is a 51-month status post right total hip replacement from conversion of previous cannulated screws.  He is doing well overall.  Walking without a cane at this point.  Surgical scars are fully healed.  Overall he is satisfied with his outcome.  From my standpoint he looks well.  He can increase activity as tolerated.  Dental prophylaxis reinforced.  Questions encouraged and answered.  He will continue to work on strengthening to help normalize his gait as he still has a little bit of a limp.  Follow-up in 3 months with standing AP pelvis  Follow-Up Instructions: Return in about 3 months (around 05/17/2020).   Orders:  No orders of the defined types were placed in this encounter.  No orders of the defined types were placed in this encounter.   Imaging: No results found.  PMFS History: Patient Active Problem List   Diagnosis Date Noted  . Status post right hip replacement 02/15/2020  . Status post total replacement of right hip 11/22/2019  . Painful orthopaedic hardware ([place]) 11/11/2019  . 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 Weight loss, non-intentional 10/14/2019  . Bilateral leg weakness 09/03/2019  . S/P right hip fracture 07/07/2019  . Right leg pain 06/26/2019  . Femoral fracture ([place]) 06/26/2019  . Hearing loss 05/01/2019  . Chronic left leg pain 11/20/2018  . Allodynia, left leg 11/20/2018  . History of traumatic brain injury 02/23/2018  . High risk medication use, Combination of Opioid and Benzodiazapine 02/12/2018  . 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 Encephalomalacia on imaging study  05/01/2017  . SI (sacroiliac) pain 03/20/2017  . Enchondroma of left fibular head 05/17/2016  . Chronic pain syndrome 01/18/2016  . Anxiety state 08/08/2013  . Cerumen impaction 08/06/2013  . Insomnia 02/02/2013  . Right flank pain 05/21/2011  . 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 Encounter for chronic pain management 07/04/2009  . Restless leg 09/29/2007  . Hyperlipidemia 11/13/2006  . Allergic rhinitis 11/13/2006  . GASTROESOPHAGEAL REFLUX, NO ESOPHAGITIS 11/13/2006  . Low back pain 11/13/2006  . Convulsions ([place]) 11/13/2006  . Seizure ([place]) 11/13/2006   Past Medical History:  Diagnosis Date  . 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  . Allergic rhinitis 11/13/2006       . Anxiety   . Anxiety state 08/08/2013  . Asthma   . ASTHMA, INTERMITTENT 07/08/2010   Qualifier: Diagnosis of  By: Walker Kehr MD, Patrick Jupiter    . Back pain of lumbar region with sciatica 11/13/2006   MRI 12/2014 - lumbar DDD without focal neural impingement  MRI Lumbar 10/30/16 ([place]) Small central L5-S1 disc extrusion with minimal cranial migration and associated annular fissure approaches descending left S1 nerve roots without contact or displacement. Minimal bulging disc height loss without spinal canal stenosis or neural foraminal narrowing.  Minimal bulging disc at L2-L3 through L4-L5 without spinal canal stenosis or neural foraminal narrowing.  MRI Sacrum 10/30/16 ([place]) No fracture. Small  lesion left sacral ala most likely a subchondral cyst/erosion adjacent to the sacroiliac joint.     . 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 Carpal tunnel syndrome 01/09/2015   Left 12/2014   . Cervical spine pain 02/06/2015   MRI 03/2015 FINDINGS: Vertebral body height, signal and alignment are normal. The craniocervical junction is normal and cervical cord signal is normal. The central spinal canal and neural  foramina are widely patent at all levels. Scattered, mild degenerative change appears most notable at C4-5. Imaged paraspinous structures are unremarkable.  IMPRESSION: Negative for central canal or foraminal narrowing. No finding to explain the patient's symptoms. Scattered, mild facet degenerative disease is noted.   . Chronic low back pain   . Chronic pain 01/18/2016  . Chronic pain syndrome 01/18/2016  . Coccyx pain 02/06/2015  . Contact dermatitis or eczema 02/13/2018  . Convulsions ([place]) 11/13/2006     Cystic encephalomalacia left temporal and parietal regions from remote injury.   . Degenerative arthritis   . Dyslipidemia   . External hemorrhoid, bleeding 06/04/2011  . GASTROESOPHAGEAL REFLUX, NO ESOPHAGITIS 11/13/2006   Qualifier: Diagnosis of  By: Eusebio Friendly    . Hamstring muscle strain, left, initial encounter 06/26/2018  . Headache(784.0)   . Hyperlipidemia 11/13/2006   07/2016  ASCVD score of 4.7% - recommended lifestyle changes   . Insomnia 02/02/2013  . Intention tremor 10/31/2009   Qualifier: Diagnosis of  By: Walker Kehr MD, Patrick Jupiter    . Intercostal pain 04/25/2015  . Left foot pain 08/21/2018  . Left knee injury 05/09/2016  . 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 Morton's neuroma of left foot 01/18/2016  . Osteopenia 10/10/2016  . Overweight(278.02) 06/13/2009   Qualifier: Diagnosis of  By: Hedy Camara    . Pain in joint, shoulder region 07/05/2014   LEFT > Right Reports hx rotator cuff surgery on rigt shoulder previously (Dr Nicholes Stairs) but no notes available XRAY right shoulder showed some proliferative changes distal rigt clavicle   . Rash and nonspecific skin eruption 10/10/2016  . Restless leg 09/29/2007   Qualifier: Diagnosis of  By: Walker Kehr MD, Patrick Jupiter    . Restless legs syndrome (RLS)   . Sciatica of left side 10/26/2014  . Seizures ([place])    last >12yrs  . SI (sacroiliac) pain 03/20/2017  . Sinusitis chronic, frontal   . Status post lumbar spinal fusion 03/20/2017  . Subacromial or  subdeltoid bursitis 08/08/2009   MRI C-spine 2011([facility]) - normal MRI left shoulder 10/2010 ([facility]) - AC degenerative disease, normal rotator cuff 12/2012 -debridement, acromioplasty and distal clavicle excision of left shoulder, arthroscopy also performed an no need for rotator cuff repair - performed by [facility] 02/2013 - Nerve Conduction Studies ([facility]) - ulnar nerve compression 02/2013- taken back to OR for Ulnar nerve decompression 06/2013 Repeat MRI left shoulder - subacromial/subdeltoid bursitis, a small intersubstance tear to the distal infraspinatus and evidence of interval resection of the distal clavicle 06/2013 - steroid injection of left biceps 09/2013 -Repeat EMG showed improvement of ulnar nerve compression 10/2013 - referred to [facility] for second opinion due to intractable pain 11/2013 - Seen by Dr. Marlou Sa at [facility]; discussed risks/benefits of surgical intervention and bursectomy, no plan for surgery at this time     . 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 Tubular adenoma of colon 01/31/2020   01/2020 screening colonoscopy ([facility]) found 2 small tubular adenomas (3 mm & 4 mm) - recommend  follow up surveillance colonoscopy in 7 years.   . 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 Cancer Father        lung  . Heart disease Father   . Hypertension Mother   . Seizures Neg Hx   . Colon cancer Neg Hx   . Colon polyps Neg Hx   . Esophageal cancer Neg Hx   . Stomach cancer Neg Hx   . Rectal cancer Neg Hx     Past Surgical History:  Procedure Laterality Date  . [place]   struck by golf ball-49 yrs old  . CRANIOTOMY     metal plate  . HARDWARE REMOVAL Right 11/22/2019   Procedure: HARDWARE REMOVAL;  Surgeon: Leandrew Koyanagi, MD;  Location: [facility];  Service: Orthopedics;  Laterality: Right;  . HIP  PINNING,CANNULATED Right 06/27/2019   Procedure: HIP PERCUTANEOUS PINNING;  Surgeon: Meredith Pel, MD;  Location: [facility];  Service: Orthopedics;  Laterality: Right;  . NASAL FRACTURE SURGERY  1997  . SACROILIAC JOINT FUSION Left 03/20/2017   Procedure: SACROILIAC JOINT FUSION;  Surgeon: Melina Schools, MD;  Location: [facility];  Service: Orthopedics;  Laterality: Left;  90 mins  . SHOULDER ARTHROSCOPY Left    "cleaned arthritis out"  . SHOULDER SURGERY Right 2012   Rotator cuff surgery  . TONSILLECTOMY    . TOTAL HIP ARTHROPLASTY Right 11/22/2019   Procedure: CONVERSION OF PREVIOUS RIGHT HIP SURGERY TO TOTAL HIP ARTHROPLASTY ANTERIOR APPROACH, HARDWARE REMOVAL;  Surgeon: Leandrew Koyanagi, MD;  Location: [facility];  Service: Orthopedics;  Laterality: Right;  . UVULOPALATOPHARYNGOPLASTY (UPPP)/TONSILLECTOMY/SEPTOPLASTY  2001   Social History   Occupational History  . Occupation: disabled  Tobacco Use  . Smoking status: Former Smoker    Packs/day: 1.00    Years: 22.00    Pack years: 22.00    Types: Cigarettes    Quit date: 06/22/2012    Years since quitting: 7.6  . Smokeless tobacco: Current User    Types: Snuff  . Tobacco comment: per patient uses "dip" about twice a day   Substance and Sexual Activity  . Alcohol use: No    Alcohol/week: 0.0 standard drinks    Comment: none since 7/11  . Drug use: No  . Sexual activity: Never

## 2020-02-17 ENCOUNTER — Telehealth: Payer: Self-pay | Admitting: Neurology

## 2020-02-17 NOTE — Telephone Encounter (Signed)
I called pt back that per work in [employer] he needs an appointment to get refills on xanax. I stated he was last seen for office visit on January 2020. Pt stated he had a EMG in January 2021. I stated it was recommended he see MD or NP for an appointment. 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 I schedule pt for appt with Judson Roch NP on June 8,2021 at 145pm. I stated message will be sent to Dr. Jannifer Franklin to review to see if he wants to refill until appt. Pt verbalized understanding.

## 2020-02-17 NOTE — Telephone Encounter (Signed)
Pt stopped by today stating he is needing a medication refilled. Please advise.

## 2020-02-17 NOTE — Telephone Encounter (Signed)
Please advise patient to make FU appt with Dr. Jannifer Franklin or his NP. Xanax Rx may not be appropriate at this time, since he had a recent fall. Please assist with FU with NP or Dr. Jannifer Franklin for appt soon.

## 2020-02-17 NOTE — Telephone Encounter (Signed)
Refill request for xanax denied. Sent to Dr. Rexene Alberts for review and advice.Pt needs an appt. Last seen 09/2018.

## 2020-02-17 NOTE — Telephone Encounter (Signed)
Pt stopped by stating that he needs a refill on his Alprazolam, please advise.

## 2020-02-21 MED ORDER — ALPRAZOLAM 1 MG PO TABS: ORAL_TABLET | ORAL | 1 refills | Status: DC

## 2020-02-21 NOTE — Addendum Note (Signed)
Addended by: Kathrynn Ducking on: 02/21/2020 03:11 PM   Modules accepted: Orders

## 2020-02-21 NOTE — Telephone Encounter (Signed)
Last alprazolam prescription was filled 6 weeks ago, the patient takes 0.5 mg 3 times daily if needed.  I will give him another refill until he gets into the office.

## 2020-02-22 ENCOUNTER — Encounter: Payer: Self-pay | Admitting: Neurology

## 2020-02-22 ENCOUNTER — Ambulatory Visit: Payer: Self-pay | Admitting: Neurology

## 2020-02-22 NOTE — Progress Notes (Deleted)
PATIENT: Richard Glass DOB: 06-21-1971  REASON FOR VISIT: follow up HISTORY FROM: patient  HISTORY OF PRESENT ILLNESS: Today 02/22/20  Richard Glass is a 49 year old male with history of temporoparietal encephalopathy associated with TBI.  He has subsequent seizures.  He remains on phenobarbital and Topamax with excellent control.  He was last seen in January 2021, urgent nerve conduction evaluation after falling off a ladder in October 2020, he sustained a right hip fracture, complained of pain down the right leg, pain to soft touch, tremors in both arms, and weight loss.  No evidence of peripheral neuropathy, EMG was unremarkable, no evidence of overlying cervical or lumbosacral radiculopathy in the right upper or lower extremity.  Nerve conduction did show slight slowing of the right peroneal nerve, distal dysfunction of the right ulnar nerve.  HISTORY 10/06/2018 Dr. 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  He has had excellent control of his seizures, currently he is on phenobarbital and Topamax.  The patient also takes gabapentin for pain issues.  He has had some low back pain, he has had a fusion of the left SI joint in the past.  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  The patient indicates that over the last 3 months he has had increasing problems with walking.  He claims that he has severe pain around the left knee, he may have swelling of the knee, he may have pain into the hamstrings that is worse with weightbearing, but also bothers him a lot at night when he tries to sleep.  The patient has had some falls, the last fall was 1 week ago.  He is now back to using a walker for ambulation, he has used a walker in the past as well.  The  patient denies any changes in bowel bladder function.  He reports that both feet feel cold.  The patient has chronic issues with left-sided numbness involving the arm and the leg and he feels that the left arm is somewhat weak as well.  He does note some neck pain but this is not severe.  He denies any headaches, dizziness, vision changes or changes in speech or swallowing.  The patient comes to this office for an evaluation.  REVIEW OF SYSTEMS: Out of a complete 14 system review of symptoms, the patient complains only of the following symptoms, and all other reviewed systems are negative.  ALLERGIES: Allergies  Allergen Reactions  . Ibuprofen Diarrhea    Per patient also burns stomach   . No Known Allergies     HOME MEDICATIONS: Outpatient Medications Prior to Visit  Medication Sig Dispense Refill  . ALPRAZolam (XANAX) 1 MG tablet TAKE 1/2 TABLET BY MOUTH THREE TIMES DAILY AS NEEDED. 45 tablet 1  . amoxicillin (AMOXIL) 500 MG capsule Take 4 pills one hour prior to dental procedure or colonoscopy 12 capsule 0  . [facility] CALCIUM 600+D 600-800 MG-UNIT TABS TAKE 1 TABLET BY MOUTH TWICE A DAY BEFORE A MEAL (Patient taking differently: Take 1 tablet by mouth 2 (two) times daily before a meal. ) 180 tablet PRN  . fluticasone (FLONASE) 50 MCG/ACT nasal spray PLACE 2 SPRAYS IN EACH NOSTRIL EVERY DAY (Patient taking differently: Place 2 sprays into both nostrils daily as needed for allergies. ) 48  mL 3  . gabapentin (NEURONTIN) 600 MG tablet Take 2 tablets (1,200 mg total) by mouth 3 (three) times daily. 360 tablet 3  . HYDROcodone-acetaminophen (NORCO) 7.5-325 MG tablet Take 1 tablet by mouth every 6 (six) hours as needed for moderate pain. 100 tablet 0  . methocarbamol (ROBAXIN) 750 MG tablet Take 1 tablet (750 mg total) by mouth 2 (two) times daily as needed for muscle spasms. 20 tablet 3  . Multiple Vitamin (MULTIVITAMIN WITH MINERALS) TABS tablet Take 1 tablet by mouth daily.    Marland Kitchen PHENobarbital  (LUMINAL) 64.8 MG tablet TAKE 3 TABLETS BY MOUTH AT BEDTIME (Patient taking differently: Take 194.4 mg by mouth at bedtime. ) 270 tablet 1  . Sodium Sulfate-Mag Sulfate-KCl (SUTAB) [phone] MG TABS Take 1 kit by mouth as directed. 24 tablet 0  . topiramate (TOPAMAX) 50 MG tablet TAKE 1 TABLET BY MOUTH EVERY MORNING AND 2 TABLETS EVERY EVENING (Patient taking differently: Take 50-100 mg by mouth See admin instructions. Take 50 mg by mouth in the morning and 100 mg in the evening) 270 tablet 1  . triamcinolone ointment (KENALOG) 0.1 % APPLY TO AFFECTED AREA TWICE A DAY (Patient not taking: Reported on 01/24/2020) 80 g 1   No facility-administered medications prior to visit.    PAST MEDICAL HISTORY: Past Medical History:  Diagnosis Date  . 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  . Allergic rhinitis 11/13/2006       . Anxiety   . Anxiety state 08/08/2013  . Asthma   . ASTHMA, INTERMITTENT 07/08/2010   Qualifier: Diagnosis of  By: Walker Kehr MD, Patrick Jupiter    . Back pain of lumbar region with sciatica 11/13/2006   MRI 12/2014 - lumbar DDD without focal neural impingement  MRI Lumbar 10/30/16 ([place]) Small central L5-S1 disc extrusion with minimal cranial migration and associated annular fissure approaches descending left S1 nerve roots without contact or displacement. Minimal bulging disc height loss without spinal canal stenosis or neural foraminal narrowing.  Minimal bulging disc at L2-L3 through L4-L5 without spinal canal stenosis or neural foraminal narrowing.  MRI Sacrum 10/30/16 ([place]) No fracture. Small lesion left sacral ala most likely a subchondral cyst/erosion adjacent to the sacroiliac joint.     . 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 Carpal tunnel syndrome 01/09/2015   Left 12/2014   . Cervical spine pain 02/06/2015   MRI 03/2015 FINDINGS: Vertebral body height,  signal and alignment are normal. The craniocervical junction is normal and cervical cord signal is normal. The central spinal canal and neural foramina are widely patent at all levels. Scattered, mild degenerative change appears most notable at C4-5. Imaged paraspinous structures are unremarkable.  IMPRESSION: Negative for central canal or foraminal narrowing. No finding to explain the patient's symptoms. Scattered, mild facet degenerative disease is noted.   . Chronic low back pain   . Chronic pain 01/18/2016  . Chronic pain syndrome 01/18/2016  . Coccyx pain 02/06/2015  . Contact dermatitis or eczema 02/13/2018  . Convulsions ([place]) 11/13/2006     Cystic encephalomalacia left temporal and parietal regions from remote injury.   . Degenerative arthritis   . Dyslipidemia   . External hemorrhoid, bleeding 06/04/2011  . GASTROESOPHAGEAL REFLUX, NO ESOPHAGITIS 11/13/2006   Qualifier: Diagnosis of  By: Eusebio Friendly    . Hamstring muscle strain, left, initial encounter 06/26/2018  . Headache(784.0)   . Hyperlipidemia 11/13/2006  07/2016  ASCVD score of 4.7% - recommended lifestyle changes   . Insomnia 02/02/2013  . Intention tremor 10/31/2009   Qualifier: Diagnosis of  By: Walker Kehr MD, Patrick Jupiter    . Intercostal pain 04/25/2015  . Left foot pain 08/21/2018  . Left knee injury 05/09/2016  . 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 Morton's neuroma of left foot 01/18/2016  . Osteopenia 10/10/2016  . Overweight(278.02) 06/13/2009   Qualifier: Diagnosis of  By: Hedy Camara    . Pain in joint, shoulder region 07/05/2014   LEFT > Right Reports hx rotator cuff surgery on rigt shoulder previously (Dr Nicholes Stairs) but no notes available XRAY right shoulder showed some proliferative changes distal rigt clavicle   . Rash and nonspecific skin eruption 10/10/2016  . Restless leg 09/29/2007   Qualifier: Diagnosis of  By: Walker Kehr MD, Patrick Jupiter    . Restless legs syndrome (RLS)   . Sciatica of left side 10/26/2014  . Seizures ([place])    last  >59yr  . SI (sacroiliac) pain 03/20/2017  . Sinusitis chronic, frontal   . Status post lumbar spinal fusion 03/20/2017  . Subacromial or subdeltoid bursitis 08/08/2009   MRI C-spine 2011([facility]) - normal MRI left shoulder 10/2010 ([facility]) - AC degenerative disease, normal rotator cuff 12/2012 -debridement, acromioplasty and distal clavicle excision of left shoulder, arthroscopy also performed an no need for rotator cuff repair - performed by [facility] 02/2013 - Nerve Conduction Studies ([facility]) - ulnar nerve compression 02/2013- taken back to OR for Ulnar nerve decompression 06/2013 Repeat MRI left shoulder - subacromial/subdeltoid bursitis, a small intersubstance tear to the distal infraspinatus and evidence of interval resection of the distal clavicle 06/2013 - steroid injection of left biceps 09/2013 -Repeat EMG showed improvement of ulnar nerve compression 10/2013 [facility] to [facility] - [facility] discussed risks/benefits of surgical intervention and bursectomy, no plan for surgery at this time     . 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 Tubular adenoma of colon 01/31/2020   01/2020 screening colonoscopy ([facility]) found 2 small tubular adenomas (3 mm & 4 mm) - recommend follow up surveillance colonoscopy in 7 years.   . 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 [place]  struck by golf ball-49 yrs old  . CRANIOTOMY     metal plate  . HARDWARE REMOVAL Right 11/22/2019   Procedure: HARDWARE REMOVAL;  Surgeon: XLeandrew Koyanagi MD;  Location: [facility]  Service: Orthopedics;  Laterality: Right;  . HIP PINNING,CANNULATED Right 06/27/2019   Procedure: HIP PERCUTANEOUS PINNING;  Surgeon: DMeredith Pel MD;  [facility] [facility]  Service:  Orthopedics;  Laterality: Right;  . NASAL FRACTURE SURGERY  1997  . SACROILIAC JOINT FUSION Left 03/20/2017   Procedure: SACROILIAC JOINT FUSION;  Surgeon: BMelina Schools MD;  [place] [facility]  Service: Orthopedics;  Laterality: Left;  90 mins  . SHOULDER ARTHROSCOPY Left    "cleaned arthritis out"  . SHOULDER SURGERY Right 2012   Rotator cuff surgery  . TONSILLECTOMY    . TOTAL HIP ARTHROPLASTY Right 11/22/2019   Procedure: CONVERSION OF PREVIOUS RIGHT HIP SURGERY TO TOTAL HIP ARTHROPLASTY ANTERIOR APPROACH, HARDWARE REMOVAL;  Surgeon: XLeandrew Koyanagi MD;  [place] [facility]  Service: Orthopedics;  Laterality: Right;  . UVULOPALATOPHARYNGOPLASTY (  UPPP)/TONSILLECTOMY/SEPTOPLASTY  2001    FAMILY HISTORY: Family History  Problem Relation Age of Onset  . Cancer Father        lung  . Heart disease Father   . Hypertension Mother   . Seizures Neg Hx   . Colon cancer Neg Hx   . Colon polyps Neg Hx   . Esophageal cancer Neg Hx   . Stomach cancer Neg Hx   . Rectal cancer Neg Hx     SOCIAL HISTORY: Social History   Socioeconomic History  . Marital status: Single    Spouse name: Not on file  . Number of children: 0  . Years of education: 48  . Highest education level: Not on file  Occupational History  . Occupation: disabled  Tobacco Use  . Smoking status: Former Smoker    Packs/day: 1.00    Years: 22.00    Pack years: 22.00    Types: Cigarettes    Quit date: 06/22/2012    Years since quitting: 7.6  . Smokeless tobacco: Current User    Types: Snuff  . Tobacco comment: per patient uses "dip" about twice a day   Substance and Sexual Activity  . Alcohol use: No    Alcohol/week: 0.0 standard drinks    Comment: none since 7/11  . Drug use: No  . Sexual activity: Never  Other Topics Concern  . 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 Difficulty of Paying Living Expenses:   Food Insecurity:   . Worried About Charity fundraiser in the Last Year:   . Arboriculturist in the Last Year:   Transportation Needs: No Transportation Needs  . Lack of Transportation (Medical): No  . Lack of Transportation (Non-Medical): No  Physical Activity:   . Days of Exercise per Week:   . Minutes of Exercise per Session:   Stress:   . Feeling of Stress :   Social Connections:   . Frequency of Communication with Friends and Family:   . Frequency of Social Gatherings with Friends and Family:   . Attends Religious Services:   . Active Member of Clubs or Organizations:   . Attends Archivist Meetings:   Marland Kitchen Marital Status:   Intimate Partner Violence:   . Fear of Current or Ex-Partner:   . Emotionally Abused:   Marland Kitchen Physically Abused:   . Sexually Abused:       PHYSICAL EXAM  There were no vitals filed for this visit. There is no height or weight on file to calculate BMI.  Generalized: Well developed, in no acute distress   Neurological examination  Mentation: Alert oriented to time, place, history taking. Follows all commands speech and language fluent Cranial nerve II-XII: Pupils were equal round reactive to light. Extraocular movements were full, visual field were full on confrontational test. Facial sensation and strength were normal. Uvula tongue midline. Head turning and shoulder shrug  were normal and symmetric. Motor: The motor testing reveals 5 over 5 strength of all 4 extremities. Good symmetric motor tone is noted throughout.  Sensory: Sensory testing is intact to soft touch on all 4 extremities. No evidence of extinction is noted.  Coordination: Cerebellar  testing reveals good finger-nose-finger and heel-to-shin bilaterally.  Gait and station: Gait is normal. Tandem gait is normal. Romberg is negative. No drift is seen.   Reflexes: Deep tendon reflexes are symmetric and normal bilaterally.   DIAGNOSTIC DATA (LABS, IMAGING, TESTING) - I reviewed patient records, labs, notes, testing and imaging myself where available.  Lab Results  Component Value Date   WBC 8.4 11/23/2019   HGB 9.8 (L) 11/23/2019   HCT 30.3 (L) 11/23/2019   MCV 93.8 11/23/2019   PLT 232 11/23/2019      Component Value Date/Time   NA 140 11/23/2019 0524   NA 140 10/14/2019 1501   K 3.5 11/23/2019 0524   CL 105 11/23/2019 0524   CO2 25 11/23/2019 0524   GLUCOSE 121 (H) 11/23/2019 0524   BUN 9 11/23/2019 0524   BUN 7 10/14/2019 1501   CREATININE 1.11 11/23/2019 0524   CREATININE 0.73 07/26/2016 0911   CALCIUM 7.8 (L) 11/23/2019 0524   PROT 6.5 11/18/2019 1345   PROT 7.0 10/14/2019 1501   ALBUMIN 4.2 11/18/2019 1345   ALBUMIN 4.4 10/14/2019 1501   AST 21 11/18/2019 1345   ALT 18 11/18/2019 1345   ALKPHOS 64 11/18/2019 1345   BILITOT 0.3 11/18/2019 1345   BILITOT <0.2 10/14/2019 1501   GFRNONAA >60 11/23/2019 0524   GFRNONAA >89 07/26/2016 0911   GFRAA >60 11/23/2019 0524   GFRAA >89 07/26/2016 0911   Lab Results  Component Value Date   CHOL 238 (H) 07/26/2016   HDL 41 07/26/2016   LDLCALC 160 (H) 07/26/2016   LDLDIRECT 90 06/18/2011   TRIG 187 (H) 07/26/2016   CHOLHDL 5.8 (H) 07/26/2016   Lab Results  Component Value Date   HGBA1C 5.1 12/17/2011   No results found for: [id number] Lab Results  Component Value Date   TSH 2.130 10/14/2019      ASSESSMENT AND PLAN 49 y.o. year old male  has a past medical history of Abnormal head CT (05/2011), Allergic rhinitis (11/13/2006), Anxiety, Anxiety state (08/08/2013), Asthma, ASTHMA, INTERMITTENT (07/08/2010), Back pain of lumbar region with sciatica (11/13/2006), Bone tumor (05/17/2016), Carpal tunnel syndrome (01/09/2015), Cervical spine pain (02/06/2015), Chronic low back pain, Chronic pain (01/18/2016), Chronic pain syndrome (01/18/2016), Coccyx pain (02/06/2015), Contact  dermatitis or eczema (02/13/2018), Convulsions ([place]) (11/13/2006), Degenerative arthritis, Dyslipidemia, External hemorrhoid, bleeding (06/04/2011), GASTROESOPHAGEAL REFLUX, NO ESOPHAGITIS (11/13/2006), Hamstring muscle strain, left, initial encounter (06/26/2018), Headache(784.0), Hyperlipidemia (11/13/2006), Insomnia (02/02/2013), Intention tremor (10/31/2009), Intercostal pain (04/25/2015), Left foot pain (08/21/2018), Left knee injury (05/09/2016), 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 (0/98/1191), Morton's neuroma of left foot (01/18/2016), Osteopenia (10/10/2016), Overweight(278.02) (06/13/2009), Pain in joint, shoulder region (07/05/2014), Rash and nonspecific skin eruption (10/10/2016), Restless leg (09/29/2007), Restless legs syndrome (RLS), Sciatica of left side (10/26/2014), Seizures ([place]), SI (sacroiliac) pain (03/20/2017), Sinusitis chronic, frontal, Status post lumbar spinal fusion (03/20/2017), Subacromial or subdeltoid bursitis (08/08/2009), Traumatic cerebral hemorrhage ([place]) (1975), Tubular adenoma of colon (01/31/2020), and 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 here with ***   I spent 15 minutes with the patient. 50% of this time was spent   Butler Denmark, [place], DNP 02/22/2020, 5:42 AM [facility] [address] [place], [place] [address] [phone]

## 2020-03-27 DIAGNOSIS — Z79899 Other long term (current) drug therapy: Secondary | ICD-10-CM | POA: Diagnosis not present

## 2020-03-27 DIAGNOSIS — M25551 Pain in right hip: Secondary | ICD-10-CM | POA: Diagnosis not present

## 2020-03-27 DIAGNOSIS — M25519 Pain in unspecified shoulder: Secondary | ICD-10-CM | POA: Diagnosis not present

## 2020-03-27 DIAGNOSIS — Z96619 Presence of unspecified artificial shoulder joint: Secondary | ICD-10-CM | POA: Diagnosis not present

## 2020-03-27 DIAGNOSIS — E559 Vitamin D deficiency, unspecified: Secondary | ICD-10-CM | POA: Diagnosis not present

## 2020-03-27 DIAGNOSIS — M129 Arthropathy, unspecified: Secondary | ICD-10-CM | POA: Diagnosis not present

## 2020-03-27 DIAGNOSIS — G8929 Other chronic pain: Secondary | ICD-10-CM | POA: Diagnosis not present

## 2020-04-10 DIAGNOSIS — M25519 Pain in unspecified shoulder: Secondary | ICD-10-CM | POA: Diagnosis not present

## 2020-04-10 DIAGNOSIS — Z79899 Other long term (current) drug therapy: Secondary | ICD-10-CM | POA: Diagnosis not present

## 2020-04-10 DIAGNOSIS — G8929 Other chronic pain: Secondary | ICD-10-CM | POA: Diagnosis not present

## 2020-04-10 DIAGNOSIS — Z96619 Presence of unspecified artificial shoulder joint: Secondary | ICD-10-CM | POA: Diagnosis not present

## 2020-04-10 DIAGNOSIS — M25551 Pain in right hip: Secondary | ICD-10-CM | POA: Diagnosis not present

## 2020-04-17 ENCOUNTER — Other Ambulatory Visit: Payer: Self-pay

## 2020-04-17 ENCOUNTER — Ambulatory Visit (INDEPENDENT_AMBULATORY_CARE_PROVIDER_SITE_OTHER): Payer: Medicare Other | Admitting: Neurology

## 2020-04-17 ENCOUNTER — Encounter: Payer: Self-pay | Admitting: Neurology

## 2020-04-17 VITALS — BP 105/67 | HR 60 | Ht 68.0 in | Wt 136.0 lb

## 2020-04-17 DIAGNOSIS — Z87820 Personal history of traumatic brain injury: Secondary | ICD-10-CM

## 2020-04-17 DIAGNOSIS — G894 Chronic pain syndrome: Secondary | ICD-10-CM | POA: Diagnosis not present

## 2020-04-17 DIAGNOSIS — R561 Post traumatic seizures: Secondary | ICD-10-CM

## 2020-04-17 DIAGNOSIS — R29898 Other symptoms and signs involving the musculoskeletal system: Secondary | ICD-10-CM | POA: Diagnosis not present

## 2020-04-17 DIAGNOSIS — R634 Abnormal weight loss: Secondary | ICD-10-CM

## 2020-04-17 MED ORDER — PHENOBARBITAL 64.8 MG PO TABS: 194.4000 mg | ORAL_TABLET | Freq: Every day | ORAL | 1 refills | Status: DC

## 2020-04-17 MED ORDER — DIVALPROEX SODIUM 500 MG PO DR TAB: DELAYED_RELEASE_TABLET | ORAL | 3 refills | Status: DC

## 2020-04-17 MED ORDER — ALPRAZOLAM 1 MG PO TABS: ORAL_TABLET | ORAL | 3 refills | Status: DC

## 2020-04-17 NOTE — Patient Instructions (Signed)
With the Topamax 50 mg tablets, go to one twice a day for 2 weeks, then to one a day for 2 weeks, then stop.  We will start Depakote for the seizures now.  Depakote (valproic acid) is a seizure medication that also has an FDA approval for migraine headache. The most common potential side effects of this medication include weight gain, tremor, or possible stomach upset. This medication can potentially cause liver problems. If confusion is noted on this medication, contact our office immediately.

## 2020-04-17 NOTE — Progress Notes (Signed)
Reason for visit: Seizures, chronic back pain, history of traumatic brain injury  Richard Glass is an 49 y.o. 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  He has not had any recurrent seizures recently.  He does own a motor vehicle and drives.  He has chronic low back pain and some discomfort at the knees bilaterally, right greater than left.  He sometimes will wear a right knee brace.  He has some gait instability, he may fall on occasion, the last fall was 1 week ago.  He has had a prior left SI joint fusion for the back pain.  He reports numbness of the left leg below the knee.  The patient has had decreased appetite, he has lost about 27 pounds over the last year and a half.  He remains on Topamax, phenobarbital, and gabapentin.  He returns for an evaluation.  Past Medical History:  Diagnosis Date  . 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  . Allergic rhinitis 11/13/2006       . Anxiety   . Anxiety state 08/08/2013  . Asthma   . ASTHMA, INTERMITTENT 07/08/2010   Qualifier: Diagnosis of  By: Walker Kehr MD, Patrick Jupiter    . Back pain of lumbar region with sciatica 11/13/2006   MRI 12/2014 - lumbar DDD without focal neural impingement  MRI Lumbar 10/30/16 ([place]) Small central L5-S1 disc extrusion with minimal cranial migration and associated annular fissure approaches descending left S1 nerve roots without contact or displacement. Minimal bulging disc height loss without spinal canal stenosis or neural foraminal narrowing.  Minimal bulging disc at L2-L3 through L4-L5 without spinal canal stenosis or neural foraminal narrowing.  MRI Sacrum 10/30/16 ([place]) No fracture. Small lesion left sacral ala most likely a subchondral cyst/erosion adjacent to the  sacroiliac joint.     . 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 Carpal tunnel syndrome 01/09/2015   Left 12/2014   . Cervical spine pain 02/06/2015   MRI 03/2015 FINDINGS: Vertebral body height, signal and alignment are normal. The craniocervical junction is normal and cervical cord signal is normal. The central spinal canal and neural foramina are widely patent at all levels. Scattered, mild degenerative change appears most notable at C4-5. Imaged paraspinous structures are unremarkable.  IMPRESSION: Negative for central canal or foraminal narrowing. No finding to explain the patient's symptoms. Scattered, mild facet degenerative disease is noted.   . Chronic low back pain   . Chronic pain 01/18/2016  . Chronic pain syndrome 01/18/2016  . Coccyx pain 02/06/2015  . Contact dermatitis or eczema 02/13/2018  . Convulsions ([place]) 11/13/2006     Cystic encephalomalacia left temporal and parietal regions from remote injury.   . Degenerative arthritis   . Dyslipidemia   . External hemorrhoid, bleeding 06/04/2011  . GASTROESOPHAGEAL REFLUX, NO ESOPHAGITIS 11/13/2006   Qualifier: Diagnosis of  By: Eusebio Friendly    . Hamstring muscle strain, left, initial encounter 06/26/2018  . Headache(784.0)   . Hyperlipidemia 11/13/2006   07/2016  ASCVD score of 4.7% - recommended lifestyle changes   . Insomnia 02/02/2013  . Intention tremor 10/31/2009   Qualifier: Diagnosis of  By: Walker Kehr MD, Patrick Jupiter    . Intercostal pain 04/25/2015  . Left foot pain 08/21/2018  . Left knee  injury 05/09/2016  . 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 Morton's neuroma of left foot 01/18/2016  . Osteopenia 10/10/2016  . Overweight(278.02) 06/13/2009   Qualifier: Diagnosis of  By: Hedy Camara    . Pain in joint, shoulder region 07/05/2014   LEFT > Right Reports hx rotator cuff surgery on rigt shoulder previously (Dr Nicholes Stairs) but no notes available XRAY right shoulder showed some proliferative changes distal rigt clavicle     . Rash and nonspecific skin eruption 10/10/2016  . Restless leg 09/29/2007   Qualifier: Diagnosis of  By: Walker Kehr MD, Patrick Jupiter    . Restless legs syndrome (RLS)   . Sciatica of left side 10/26/2014  . Seizures ([place])    last >34yrs  . SI (sacroiliac) pain 03/20/2017  . Sinusitis chronic, frontal   . Status post lumbar spinal fusion 03/20/2017  . Subacromial or subdeltoid bursitis 08/08/2009   MRI C-spine 2011([facility]) - normal MRI left shoulder 10/2010 ([facility]) - AC degenerative disease, normal rotator cuff 12/2012 -debridement, acromioplasty and distal clavicle excision of left shoulder, arthroscopy also performed an no need for rotator cuff repair - performed by [facility] 02/2013 - Nerve Conduction Studies ([facility]) - ulnar nerve compression 02/2013- taken back to OR for Ulnar nerve decompression 06/2013 Repeat MRI left shoulder - subacromial/subdeltoid bursitis, a small intersubstance tear to the distal infraspinatus and evidence of interval resection of the distal clavicle 06/2013 - steroid injection of left biceps 09/2013 -Repeat EMG showed improvement of ulnar nerve compression 10/2013 - referred to [facility] for second opinion due to intractable pain 11/2013 - Seen by Dr. Marlou Sa at [facility]; discussed risks/benefits of surgical intervention and bursectomy, no plan for surgery at this time     . 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 Tubular adenoma of colon 01/31/2020   01/2020 screening colonoscopy ([facility]) found 2 small tubular adenomas (3 mm & 4 mm) - recommend follow up surveillance colonoscopy in 7 years.   . 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 [place]   struck by golf ball-49 yrs old  . CRANIOTOMY     metal plate  . HARDWARE REMOVAL Right 11/22/2019   Procedure: HARDWARE REMOVAL;  Surgeon: Leandrew Koyanagi, MD;  Location: [place];  Service: Orthopedics;  Laterality: Right;  . HIP PINNING,CANNULATED Right 06/27/2019   Procedure: HIP PERCUTANEOUS PINNING;  Surgeon: Meredith Pel, MD;  Location: [place];  Service: Orthopedics;  Laterality: Right;  . NASAL FRACTURE SURGERY  1997  . SACROILIAC JOINT FUSION Left 03/20/2017   Procedure: SACROILIAC JOINT FUSION;  Surgeon: Melina Schools, MD;  Location: [place];  Service: Orthopedics;  Laterality: Left;  90 mins  . SHOULDER ARTHROSCOPY Left    "cleaned arthritis out"  . SHOULDER SURGERY Right 2012   Rotator cuff surgery  . TONSILLECTOMY    . TOTAL HIP ARTHROPLASTY Right 11/22/2019   Procedure: CONVERSION OF PREVIOUS RIGHT HIP SURGERY TO TOTAL HIP ARTHROPLASTY ANTERIOR APPROACH, HARDWARE REMOVAL;  Surgeon: Leandrew Koyanagi, MD;  Location: [place];  Service: Orthopedics;  Laterality: Right;  . UVULOPALATOPHARYNGOPLASTY (UPPP)/TONSILLECTOMY/SEPTOPLASTY  2001    Family History  Problem Relation Age of Onset  . Cancer Father        lung  . Heart disease Father   . Hypertension Mother   . Seizures Neg Hx   . Colon cancer Neg Hx   .  Colon polyps Neg Hx   . Esophageal cancer Neg Hx   . Stomach cancer Neg Hx   . Rectal cancer Neg Hx     Social history:  reports that he quit smoking about 7 years ago. His smoking use included cigarettes. He has a 22.00 pack-year smoking history. His smokeless tobacco use includes snuff. He reports that he does not drink alcohol and does not use drugs.    Allergies  Allergen Reactions  . Ibuprofen Diarrhea    Per patient also burns stomach   . No Known Allergies     Medications:  Prior to Admission medications   Medication Sig Start Date End Date Taking? Authorizing Provider  ALPRAZolam [name]) 1 MG tablet TAKE 1/2 TABLET BY MOUTH THREE TIMES DAILY AS NEEDED. 02/21/20  Yes Kathrynn Ducking, MD  Calcium Carb-Cholecalciferol ([employer] CALCIUM + D3) 600-800 MG-UNIT TABS Take 1 tablet by mouth 2 (two) times daily before a meal.   Yes [provider]  fluticasone (FLONASE) 50 MCG/ACT nasal spray PLACE 2 SPRAYS IN EACH NOSTRIL EVERY DAY Patient taking differently: Place 2 sprays into both nostrils daily as needed for allergies.  05/04/19  Yes [name], [name], MD  gabapentin (NEURONTIN) 600 MG tablet Take 2 tablets (1,200 mg total) by mouth 3 (three) times daily. Patient taking differently: Take by mouth. Takes 1 tablet in the morning and 2 tablets at bedtime. 04/06/19  Yes [name], [name], MD  HYDROcodone-acetaminophen (NORCO) 10-325 MG tablet Take 1 tablet by mouth 4 (four) times daily as needed.   Yes [provider]  methocarbamol (ROBAXIN) 500 MG tablet Take 500 mg by mouth at bedtime as needed. 03/27/20  Yes [provider]  Multiple Vitamins-Minerals (CENTRUM SILVER PO) Take 1 each by mouth daily.   Yes [provider]  PHENobarbital (LUMINAL) 64.8 MG tablet TAKE 3 TABLETS BY MOUTH AT BEDTIME Patient taking differently: Take 194.4 mg by mouth at bedtime.  10/21/19  Yes Kathrynn Ducking, MD  topiramate (TOPAMAX) 50 MG tablet TAKE 1 TABLET BY MOUTH EVERY MORNING AND 2 TABLETS EVERY EVENING Patient taking differently: Take 50-100 mg by mouth See admin instructions. Take 50 mg by mouth in the morning and 100 mg in the evening 10/07/19  Yes Ward Givens, NP    ROS:  Out of a complete 14 system review of symptoms, the patient complains only of the following symptoms, and all other reviewed systems are negative.  Back pain Knee discomfort Weight loss, decreased appetite  Blood pressure 105/67, pulse 60, height 5\' 8"  (1.727 m), weight 136 lb (61.7 kg).  Physical Exam  General: The patient is alert and cooperative at the time of the examination.  Skin: No significant peripheral edema is noted.   Neurologic Exam  Mental status: The patient is alert and oriented x 3 at the time of the examination. The patient has apparent normal recent and remote memory, with an apparently normal attention  span and concentration ability.   Cranial nerves: Facial symmetry is present. Speech is normal, no aphasia or dysarthria is noted. Extraocular movements are full.  The patient has prominent end gaze nystagmus bilaterally.  Visual fields are full.  Motor: The patient has good strength in all 4 extremities, with exception of some weakness with hip flexion on the right greater than left legs.  Sensory examination: Soft touch sensation is symmetric on the face, arms, and legs.  Coordination: The patient has good finger-nose-finger and heel-to-shin bilaterally.  Gait and station: The  patient has a slightly wide-based gait, the patient can walk without assistance.  Romberg is negative.  Reflexes: Deep tendon reflexes are symmetric.   Assessment/Plan:  1.  Chronic low back pain  2.  History of seizures  3.  History of traumatic brain injury, left brain encephalomalacia  The patient is continue to lose weight, we will taper off the Topamax which can promote weight loss.  He will be started on Depakote which can promote weight gain and is an excellent seizure medication.  The patient was given a prescription for the Depakote, phenobarbital, and alprazolam.  He will follow-up here in about 6 months.  He did have a phenobarbital level done in January 2021 with a level of 39.  In March she had a CBC and differential, and a comprehensive metabolic profile.  Jill Alexanders MD 04/17/2020 9:33 AM  [facility] [address] [place], [place] [address]  Phone [phone] Fax [phone]

## 2020-04-21 ENCOUNTER — Other Ambulatory Visit: Payer: Self-pay | Admitting: Neurology

## 2020-04-25 ENCOUNTER — Other Ambulatory Visit: Payer: Self-pay | Admitting: Adult Health

## 2020-05-01 DIAGNOSIS — Z1159 Encounter for screening for other viral diseases: Secondary | ICD-10-CM | POA: Diagnosis not present

## 2020-05-01 DIAGNOSIS — Z0000 Encounter for general adult medical examination without abnormal findings: Secondary | ICD-10-CM | POA: Diagnosis not present

## 2020-05-01 DIAGNOSIS — R0602 Shortness of breath: Secondary | ICD-10-CM | POA: Diagnosis not present

## 2020-05-01 DIAGNOSIS — F17210 Nicotine dependence, cigarettes, uncomplicated: Secondary | ICD-10-CM | POA: Diagnosis not present

## 2020-05-01 DIAGNOSIS — Z20822 Contact with and (suspected) exposure to covid-19: Secondary | ICD-10-CM | POA: Diagnosis not present

## 2020-05-01 DIAGNOSIS — R5383 Other fatigue: Secondary | ICD-10-CM | POA: Diagnosis not present

## 2020-05-01 DIAGNOSIS — Z79899 Other long term (current) drug therapy: Secondary | ICD-10-CM | POA: Diagnosis not present

## 2020-05-01 DIAGNOSIS — R1084 Generalized abdominal pain: Secondary | ICD-10-CM | POA: Diagnosis not present

## 2020-05-01 DIAGNOSIS — E7800 Pure hypercholesterolemia, unspecified: Secondary | ICD-10-CM | POA: Diagnosis not present

## 2020-05-01 DIAGNOSIS — Z131 Encounter for screening for diabetes mellitus: Secondary | ICD-10-CM | POA: Diagnosis not present

## 2020-05-01 DIAGNOSIS — E559 Vitamin D deficiency, unspecified: Secondary | ICD-10-CM | POA: Diagnosis not present

## 2020-05-10 DIAGNOSIS — G8929 Other chronic pain: Secondary | ICD-10-CM | POA: Diagnosis not present

## 2020-05-10 DIAGNOSIS — M25551 Pain in right hip: Secondary | ICD-10-CM | POA: Diagnosis not present

## 2020-05-10 DIAGNOSIS — M25519 Pain in unspecified shoulder: Secondary | ICD-10-CM | POA: Diagnosis not present

## 2020-05-10 DIAGNOSIS — Z79899 Other long term (current) drug therapy: Secondary | ICD-10-CM | POA: Diagnosis not present

## 2020-05-10 DIAGNOSIS — Z96619 Presence of unspecified artificial shoulder joint: Secondary | ICD-10-CM | POA: Diagnosis not present

## 2020-05-11 DIAGNOSIS — R1084 Generalized abdominal pain: Secondary | ICD-10-CM | POA: Diagnosis not present

## 2020-05-15 ENCOUNTER — Other Ambulatory Visit: Payer: Self-pay | Admitting: Family Medicine

## 2020-05-17 ENCOUNTER — Encounter: Payer: Self-pay | Admitting: Orthopaedic Surgery

## 2020-05-17 ENCOUNTER — Ambulatory Visit (INDEPENDENT_AMBULATORY_CARE_PROVIDER_SITE_OTHER): Payer: Medicare Other

## 2020-05-17 ENCOUNTER — Ambulatory Visit (INDEPENDENT_AMBULATORY_CARE_PROVIDER_SITE_OTHER): Payer: Medicare Other | Admitting: Orthopaedic Surgery

## 2020-05-17 DIAGNOSIS — Z96641 Presence of right artificial hip joint: Secondary | ICD-10-CM

## 2020-05-17 NOTE — Progress Notes (Signed)
Post-Op Visit Note   Patient: Richard Glass           Date of Birth: 01-11-1971           MRN: [id number] Visit Date: 05/17/2020 PCP: Riki Sheer, NP   Assessment & Plan:  Chief Complaint:  Chief Complaint  Patient presents with  . Right Hip - Pain   Visit Diagnoses:  1. Status post right hip replacement     Plan: Patient is a pleasant 49 year old gentleman who comes in today 6 months out conversion right hip cannulated screws to a total hip replacement 11/22/2019.  He has been doing fairly well.  He continues to work on a home exercise program but continues to have pain and lack of strength.  He does have weakness trying to lift his legs while putting on his close.  He also has trouble when he is rolling over in the bed.  He does take oxycodone given to him by his PCP.  Examination of the right hip reveals full motion but limited strength.  He is neurovascular intact distally.  At this point, really feel he would benefit from more formal physical therapy.  I have sent in an internal referral for this.  He will call us in the next week if he has not heard back.  Dental prophylaxis reinforced.  Follow-up with [place] in 3 months time for recheck.  Call with concerns or questions in the meantime.  Follow-Up Instructions: Return in about 3 months (around 08/16/2020).   Orders:  Orders Placed This Encounter  Procedures  . XR Pelvis 1-2 Views  . Ambulatory referral to Physical Therapy   No orders of the defined types were placed in this encounter.   Imaging: XR Pelvis 1-2 Views  Result Date: 05/17/2020 Well-seated prosthesis without complication   PMFS History: Patient Active Problem List   Diagnosis Date Noted  . Status post right hip replacement 02/15/2020  . Status post total replacement of right hip 11/22/2019  . Painful orthopaedic hardware ([place]) 11/11/2019  . 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 Weight loss, non-intentional  10/14/2019  . Bilateral leg weakness 09/03/2019  . S/P right hip fracture 07/07/2019  . Right leg pain 06/26/2019  . Femoral fracture ([place]) 06/26/2019  . Hearing loss 05/01/2019  . Chronic left leg pain 11/20/2018  . Allodynia, left leg 11/20/2018  . History of traumatic brain injury 02/23/2018  . High risk medication use, Combination of Opioid and Benzodiazapine 02/12/2018  . 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 SI (sacroiliac) pain 03/20/2017  . Enchondroma of left fibular head 05/17/2016  . Chronic pain syndrome 01/18/2016  . Anxiety state 08/08/2013  . Cerumen impaction 08/06/2013  . Insomnia 02/02/2013  . Right flank pain 05/21/2011  . 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 Encounter for chronic pain management 07/04/2009  . Restless leg 09/29/2007  . Hyperlipidemia 11/13/2006  . Allergic rhinitis 11/13/2006  . GASTROESOPHAGEAL REFLUX, NO ESOPHAGITIS 11/13/2006  . Low back pain 11/13/2006  . Convulsions ([place]) 11/13/2006  . Seizure ([place]) 11/13/2006   Past Medical History:  Diagnosis Date  . 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  . Allergic rhinitis 11/13/2006       . Anxiety   . Anxiety state 08/08/2013  . Asthma   . ASTHMA, INTERMITTENT 07/08/2010   Qualifier: Diagnosis of  By: Walker Kehr MD, Patrick Jupiter    .  Back pain of lumbar region with sciatica 11/13/2006   MRI 12/2014 - lumbar DDD without focal neural impingement  MRI Lumbar 10/30/16 ([place]) Small central L5-S1 disc extrusion with minimal cranial migration and associated annular fissure approaches descending left S1 nerve roots without contact or displacement. Minimal bulging disc height loss without spinal canal stenosis or neural foraminal narrowing.  Minimal bulging disc at L2-L3 through L4-L5 without spinal canal stenosis or neural foraminal narrowing.  MRI Sacrum 10/30/16 ([place]) No  fracture. Small lesion left sacral ala most likely a subchondral cyst/erosion adjacent to the sacroiliac joint.     . 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 Carpal tunnel syndrome 01/09/2015   Left 12/2014   . Cervical spine pain 02/06/2015   MRI 03/2015 FINDINGS: Vertebral body height, signal and alignment are normal. The craniocervical junction is normal and cervical cord signal is normal. The central spinal canal and neural foramina are widely patent at all levels. Scattered, mild degenerative change appears most notable at C4-5. Imaged paraspinous structures are unremarkable.  IMPRESSION: Negative for central canal or foraminal narrowing. No finding to explain the patient's symptoms. Scattered, mild facet degenerative disease is noted.   . Chronic low back pain   . Chronic pain 01/18/2016  . Chronic pain syndrome 01/18/2016  . Coccyx pain 02/06/2015  . Contact dermatitis or eczema 02/13/2018  . Convulsions ([place]) 11/13/2006     Cystic encephalomalacia left temporal and parietal regions from remote injury.   . Degenerative arthritis   . Dyslipidemia   . External hemorrhoid, bleeding 06/04/2011  . GASTROESOPHAGEAL REFLUX, NO ESOPHAGITIS 11/13/2006   Qualifier: Diagnosis of  By: Eusebio Friendly    . Hamstring muscle strain, left, initial encounter 06/26/2018  . Headache(784.0)   . Hyperlipidemia 11/13/2006   07/2016  ASCVD score of 4.7% - recommended lifestyle changes   . Insomnia 02/02/2013  . Intention tremor 10/31/2009   Qualifier: Diagnosis of  By: Walker Kehr MD, Patrick Jupiter    . Intercostal pain 04/25/2015  . Left foot pain 08/21/2018  . Left knee injury 05/09/2016  . 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 Morton's neuroma of left foot 01/18/2016  . Osteopenia 10/10/2016  . Overweight(278.02) 06/13/2009   Qualifier: Diagnosis of  By: Hedy Camara    . Pain in joint, shoulder region 07/05/2014   LEFT > Right Reports hx rotator cuff surgery on rigt shoulder previously (Dr Nicholes Stairs) but no  notes available XRAY right shoulder showed some proliferative changes distal rigt clavicle   . Rash and nonspecific skin eruption 10/10/2016  . Restless leg 09/29/2007   Qualifier: Diagnosis of  By: Walker Kehr MD, Patrick Jupiter    . Restless legs syndrome (RLS)   . Sciatica of left side 10/26/2014  . Seizures ([place])    last >39yrs  . SI (sacroiliac) pain 03/20/2017  . Sinusitis chronic, frontal   . Status post lumbar spinal fusion 03/20/2017  . Subacromial or subdeltoid bursitis 08/08/2009   MRI C-spine 2011([facility]) - normal MRI left shoulder 10/2010 ([facility]) - AC degenerative disease, normal rotator cuff 12/2012 -debridement, acromioplasty and distal clavicle excision of left shoulder, arthroscopy also performed an no need for rotator cuff repair - performed by [facility] 02/2013 - Nerve Conduction Studies ([facility]) - ulnar nerve compression 02/2013- taken back to OR for Ulnar nerve decompression 06/2013 Repeat MRI left shoulder - subacromial/subdeltoid bursitis, a small intersubstance tear to the distal infraspinatus and evidence of interval resection of the distal clavicle 06/2013 -  steroid injection of left biceps 09/2013 -Repeat EMG showed improvement of ulnar nerve compression 10/2013 - referred to [facility] for second opinion due to intractable pain 11/2013 - Seen by Dr. Marlou Sa at [facility]; discussed risks/benefits of surgical intervention and bursectomy, no plan for surgery at this time     . 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 Tubular adenoma of colon 01/31/2020   01/2020 screening colonoscopy ([facility]) found 2 small tubular adenomas (3 mm & 4 mm) - recommend follow up surveillance colonoscopy in 7 years.   . 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 Cancer Father        lung  . Heart disease Father   . Hypertension Mother   . Seizures Neg Hx   . Colon  cancer Neg Hx   . Colon polyps Neg Hx   . Esophageal cancer Neg Hx   . Stomach cancer Neg Hx   . Rectal cancer Neg Hx     Past Surgical History:  Procedure Laterality Date  . [place]   struck by golf ball-49 yrs old  . CRANIOTOMY     metal plate  . HARDWARE REMOVAL Right 11/22/2019   Procedure: HARDWARE REMOVAL;  Surgeon: Leandrew Koyanagi, MD;  Location: [place];  Service: Orthopedics;  Laterality: Right;  . HIP PINNING,CANNULATED Right 06/27/2019   Procedure: HIP PERCUTANEOUS PINNING;  Surgeon: Meredith Pel, MD;  Location: [place];  Service: Orthopedics;  Laterality: Right;  . NASAL FRACTURE SURGERY  1997  . SACROILIAC JOINT FUSION Left 03/20/2017   Procedure: SACROILIAC JOINT FUSION;  Surgeon: Melina Schools, MD;  Location: [place];  Service: Orthopedics;  Laterality: Left;  90 mins  . SHOULDER ARTHROSCOPY Left    "cleaned arthritis out"  . SHOULDER SURGERY Right 2012   Rotator cuff surgery  . TONSILLECTOMY    . TOTAL HIP ARTHROPLASTY Right 11/22/2019   Procedure: CONVERSION OF PREVIOUS RIGHT HIP SURGERY TO TOTAL HIP ARTHROPLASTY ANTERIOR APPROACH, HARDWARE REMOVAL;  Surgeon: Leandrew Koyanagi, MD;  Location: [place];  Service: Orthopedics;  Laterality: Right;  . UVULOPALATOPHARYNGOPLASTY (UPPP)/TONSILLECTOMY/SEPTOPLASTY  2001   Social History   Occupational History  . Occupation: disabled  Tobacco Use  . Smoking status: Former Smoker    Packs/day: 1.00    Years: 22.00    Pack years: 22.00    Types: Cigarettes    Quit date: 06/22/2012    Years since quitting: 7.9  . Smokeless tobacco: Current User    Types: Snuff  . Tobacco comment: per patient uses "dip" about twice a day   Vaping Use  . Vaping Use: Never used  Substance and Sexual Activity  . Alcohol use: No    Alcohol/week: 0.0 standard drinks    Comment: none since 7/11  . Drug use: No  . Sexual activity: Never

## 2020-05-18 ENCOUNTER — Ambulatory Visit: Payer: Medicare Other | Attending: Physician Assistant | Admitting: Physical Therapy

## 2020-05-18 ENCOUNTER — Encounter: Payer: Self-pay | Admitting: Physical Therapy

## 2020-05-18 ENCOUNTER — Other Ambulatory Visit: Payer: Self-pay

## 2020-05-18 DIAGNOSIS — R262 Difficulty in walking, not elsewhere classified: Secondary | ICD-10-CM | POA: Insufficient documentation

## 2020-05-18 DIAGNOSIS — M25551 Pain in right hip: Secondary | ICD-10-CM | POA: Insufficient documentation

## 2020-05-18 DIAGNOSIS — M25651 Stiffness of right hip, not elsewhere classified: Secondary | ICD-10-CM | POA: Insufficient documentation

## 2020-05-18 DIAGNOSIS — M6281 Muscle weakness (generalized): Secondary | ICD-10-CM | POA: Diagnosis not present
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
## 2020-05-26 ENCOUNTER — Other Ambulatory Visit: Payer: Self-pay

## 2020-05-26 ENCOUNTER — Encounter: Payer: Self-pay | Admitting: Physical Therapy

## 2020-05-26 ENCOUNTER — Ambulatory Visit: Payer: Medicare Other | Admitting: Physical Therapy

## 2020-05-26 DIAGNOSIS — M25551 Pain in right hip: Secondary | ICD-10-CM

## 2020-05-26 DIAGNOSIS — R262 Difficulty in walking, not elsewhere classified: Secondary | ICD-10-CM | POA: Diagnosis not present

## 2020-05-26 DIAGNOSIS — M25651 Stiffness of right hip, not elsewhere classified: Secondary | ICD-10-CM

## 2020-05-26 DIAGNOSIS — M6281 Muscle weakness (generalized): Secondary | ICD-10-CM
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
## 2020-06-02 ENCOUNTER — Other Ambulatory Visit: Payer: Self-pay

## 2020-06-02 ENCOUNTER — Ambulatory Visit: Payer: Medicare Other | Admitting: Physical Therapy

## 2020-06-02 VITALS — BP 131/86 | HR 62

## 2020-06-02 DIAGNOSIS — M6281 Muscle weakness (generalized): Secondary | ICD-10-CM

## 2020-06-02 DIAGNOSIS — R262 Difficulty in walking, not elsewhere classified: Secondary | ICD-10-CM | POA: Diagnosis not present

## 2020-06-02 DIAGNOSIS — M25551 Pain in right hip: Secondary | ICD-10-CM | POA: Diagnosis not present

## 2020-06-02 DIAGNOSIS — M25651 Stiffness of right hip, not elsewhere classified: Secondary | ICD-10-CM | POA: Diagnosis not present
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
## 2020-06-08 ENCOUNTER — Ambulatory Visit: Payer: Medicare Other | Admitting: Physical Therapy

## 2020-06-08 ENCOUNTER — Telehealth: Payer: Self-pay | Admitting: Physical Therapy

## 2020-06-08 NOTE — Telephone Encounter (Signed)
Attempted to call patient regarding no show for 11 AM therapy appointment today. Left voicemail including reminder of next appointment time.

## 2020-06-13 ENCOUNTER — Encounter: Payer: Self-pay | Admitting: Physical Therapy

## 2020-06-13 ENCOUNTER — Ambulatory Visit: Payer: Medicare Other | Admitting: Physical Therapy

## 2020-06-13 ENCOUNTER — Other Ambulatory Visit: Payer: Self-pay

## 2020-06-13 DIAGNOSIS — R262 Difficulty in walking, not elsewhere classified: Secondary | ICD-10-CM | POA: Diagnosis not present

## 2020-06-13 DIAGNOSIS — M25651 Stiffness of right hip, not elsewhere classified: Secondary | ICD-10-CM | POA: Diagnosis not present

## 2020-06-13 DIAGNOSIS — M25551 Pain in right hip: Secondary | ICD-10-CM

## 2020-06-13 DIAGNOSIS — M6281 Muscle weakness (generalized): Secondary | ICD-10-CM
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
## 2020-06-14 DIAGNOSIS — M25512 Pain in left shoulder: Secondary | ICD-10-CM | POA: Diagnosis not present

## 2020-06-14 DIAGNOSIS — R634 Abnormal weight loss: Secondary | ICD-10-CM | POA: Diagnosis not present

## 2020-06-14 DIAGNOSIS — Z96611 Presence of right artificial shoulder joint: Secondary | ICD-10-CM | POA: Diagnosis not present

## 2020-06-14 DIAGNOSIS — M25511 Pain in right shoulder: Secondary | ICD-10-CM | POA: Diagnosis not present

## 2020-06-14 DIAGNOSIS — Z79899 Other long term (current) drug therapy: Secondary | ICD-10-CM | POA: Diagnosis not present

## 2020-06-15 ENCOUNTER — Telehealth: Payer: Self-pay | Admitting: Physical Therapy

## 2020-06-15 ENCOUNTER — Other Ambulatory Visit: Payer: Self-pay

## 2020-06-15 ENCOUNTER — Encounter: Payer: Self-pay | Admitting: Physical Therapy

## 2020-06-15 ENCOUNTER — Ambulatory Visit: Payer: Medicare Other | Admitting: Physical Therapy

## 2020-06-15 DIAGNOSIS — M6281 Muscle weakness (generalized): Secondary | ICD-10-CM

## 2020-06-15 DIAGNOSIS — M25551 Pain in right hip: Secondary | ICD-10-CM

## 2020-06-15 DIAGNOSIS — M25651 Stiffness of right hip, not elsewhere classified: Secondary | ICD-10-CM

## 2020-06-15 DIAGNOSIS — M25512 Pain in left shoulder: Secondary | ICD-10-CM | POA: Diagnosis not present

## 2020-06-15 DIAGNOSIS — R262 Difficulty in walking, not elsewhere classified: Secondary | ICD-10-CM

## 2020-06-15 NOTE — Telephone Encounter (Signed)
Attempted to call patient to check on him due to high pain level with upper back pain reported during therapy session today. No answer/left voicemail.
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
## 2020-06-20 ENCOUNTER — Ambulatory Visit: Payer: Medicare Other | Attending: Physician Assistant | Admitting: Physical Therapy

## 2020-06-20 ENCOUNTER — Other Ambulatory Visit: Payer: Self-pay

## 2020-06-20 ENCOUNTER — Encounter: Payer: Self-pay | Admitting: Physical Therapy

## 2020-06-20 DIAGNOSIS — R262 Difficulty in walking, not elsewhere classified: Secondary | ICD-10-CM | POA: Insufficient documentation

## 2020-06-20 DIAGNOSIS — M25551 Pain in right hip: Secondary | ICD-10-CM | POA: Diagnosis not present

## 2020-06-20 DIAGNOSIS — M25651 Stiffness of right hip, not elsewhere classified: Secondary | ICD-10-CM | POA: Insufficient documentation

## 2020-06-20 DIAGNOSIS — M6281 Muscle weakness (generalized): Secondary | ICD-10-CM

## 2020-06-20 NOTE — Therapy (Signed)
[place], [place], [address] Phone: [phone]   Fax:  [phone]  Physical Therapy Treatment  Patient Details  Name: Richard Glass MRN: [id number] Date of Birth: [birth year] Referring Provider (PT): Dwana Melena, PA-C (surgery by Frankey Shown, MD))   Encounter Date: 06/20/2020   PT End of Session - 06/20/20 1120    Visit Number 6    Number of Visits 12    Date for PT Re-Evaluation 06/29/20    Authorization Type [employer] Medicare-add KX due to previous home health PT, progress note by visit 10, recheck FOTO status by visit 10    PT Start Time 1059    PT Stop Time 1143    PT Time Calculation (min) 44 min    Activity Tolerance Patient tolerated treatment well    Behavior During Therapy [facility] for tasks assessed/performed           Past Medical History:  Diagnosis Date  . 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  . Allergic rhinitis 11/13/2006       . Anxiety   . Anxiety state 08/08/2013  . Asthma   . ASTHMA, INTERMITTENT 07/08/2010   Qualifier: Diagnosis of  By: Walker Kehr MD, Patrick Jupiter    . Back pain of lumbar region with sciatica 11/13/2006   MRI 12/2014 - lumbar DDD without focal neural impingement  MRI Lumbar 10/30/16 () Small central L5-S1 disc extrusion with minimal cranial migration and associated annular fissure approaches descending left S1 nerve roots without contact or displacement. Minimal bulging disc height loss without spinal canal stenosis or neural foraminal narrowing.  Minimal bulging disc at L2-L3 through L4-L5 without spinal canal stenosis or neural foraminal narrowing.  MRI Sacrum 10/30/16 ([place]) No fracture. Small lesion left sacral ala most likely a subchondral cyst/erosion adjacent to the sacroiliac joint.     . 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 Carpal tunnel  syndrome 01/09/2015   Left 12/2014   . Cervical spine pain 02/06/2015   MRI 03/2015 FINDINGS: Vertebral body height, signal and alignment are normal. The craniocervical junction is normal and cervical cord signal is normal. The central spinal canal and neural foramina are widely patent at all levels. Scattered, mild degenerative change appears most notable at C4-5. Imaged paraspinous structures are unremarkable.  IMPRESSION: Negative for central canal or foraminal narrowing. No finding to explain the patient's symptoms. Scattered, mild facet degenerative disease is noted.   . Chronic low back pain   . Chronic pain 01/18/2016  . Chronic pain syndrome 01/18/2016  . Coccyx pain 02/06/2015  . Contact dermatitis or eczema 02/13/2018  . 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   . Degenerative arthritis   . Dyslipidemia   . External hemorrhoid, bleeding 06/04/2011  . GASTROESOPHAGEAL REFLUX, NO ESOPHAGITIS 11/13/2006   Qualifier: Diagnosis of  By: Eusebio Friendly    . Hamstring muscle strain, left, initial encounter 06/26/2018  . Headache(784.0)   . Hyperlipidemia 11/13/2006   07/2016  ASCVD score of 4.7% - recommended lifestyle changes   . Insomnia 02/02/2013  . Intention tremor 10/31/2009   Qualifier: Diagnosis of  By: Walker Kehr MD, Patrick Jupiter    . Intercostal pain 04/25/2015  . Left foot pain 08/21/2018  . Left knee injury 05/09/2016  . 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 Morton's neuroma of left  foot 01/18/2016  . Osteopenia 10/10/2016  . Overweight(278.02) 06/13/2009   Qualifier: Diagnosis of  By: Hedy Camara    . Pain in joint, shoulder region 07/05/2014   LEFT > Right Reports hx rotator cuff surgery on rigt shoulder previously (Dr Nicholes Stairs) but no notes available XRAY right shoulder showed some proliferative changes distal rigt clavicle   . Rash and nonspecific skin eruption 10/10/2016  . Restless leg 09/29/2007   Qualifier: Diagnosis of  By:  Walker Kehr MD, Patrick Jupiter    . Restless legs syndrome (RLS)   . Sciatica of left side 10/26/2014  . Seizures ([place])    last >2yrs  . SI (sacroiliac) pain 03/20/2017  . Sinusitis chronic, frontal   . Status post lumbar spinal fusion 03/20/2017  . Subacromial or subdeltoid bursitis 08/08/2009   MRI C-spine 2011([facility]) - normal MRI left shoulder 10/2010 ([facility]) - AC degenerative disease, normal rotator cuff 12/2012 -debridement, acromioplasty and distal clavicle excision of left shoulder, arthroscopy also performed an no need for rotator cuff repair - performed by [facility] 02/2013 - Nerve Conduction Studies ([facility]) - ulnar nerve compression 02/2013- taken back to OR for Ulnar nerve decompression 06/2013 Repeat MRI left shoulder - subacromial/subdeltoid bursitis, a small intersubstance tear to the distal infraspinatus and evidence of interval resection of the distal clavicle 06/2013 - steroid injection of left biceps 09/2013 -Repeat EMG showed improvement of ulnar nerve compression 10/2013 - referred to [facility] for second opinion due to intractable pain 11/2013 - Se[facility] by Dr[facility] Marlou Sa [facility]; discussed risks/benefits of surgical intervention and bursectomy, no plan for surgery at this time     . 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 Tubular adenoma of colon 01/31/2020   01/2020 screening colonoscopy ([facility]) found 2 small tubular adenomas (3 mm & 4 mm) - recommend follow up surveillance colonoscopy in 7 years.   . 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 [place]   struck by golf ball-49 yrs old  . CRANIOTOMY     metal plate  . HARDWARE REMOVAL Right 11/22/2019   Procedure: HARDWARE REMOVAL;  Surgeon: Leandrew Koyanagi, MD;  Location: [facility];  Service: Orthopedics;  Laterality: Right;  . HIP PINNING,CANNULATED Right 06/27/2019   Procedure:  HIP PERCUTANEOUS PINNING;  Surgeon: Meredith Pel, MD;  Location: [facility];  Service: Orthopedics;  Laterality: Right;  . NASAL FRACTURE SURGERY  1997  . SACROILIAC JOINT FUSION Left 03/20/2017   Procedure: SACROILIAC JOINT FUSION;  Surgeon: Melina Schools, MD;  Location: [facility];  Service: Orthopedics;  Laterality: Left;  90 mins  . SHOULDER ARTHROSCOPY Left    "cleaned arthritis out"  . SHOULDER SURGERY Right 2012   Rotator cuff surgery  . TONSILLECTOMY    . TOTAL HIP ARTHROPLASTY Right 11/22/2019   Procedure: CONVERSION OF PREVIOUS RIGHT HIP SURGERY TO TOTAL HIP ARTHROPLASTY ANTERIOR APPROACH, HARDWARE REMOVAL;  Surgeon: Leandrew Koyanagi, MD;  Location: [facility];  Service: Orthopedics;  Laterality: Right;  . UVULOPALATOPHARYNGOPLASTY (UPPP)/TONSILLECTOMY/SEPTOPLASTY  2001    There were no vitals filed for this visit.   Subjective Assessment - 06/20/20 1103    Subjective Pt. went to orthopedic urgent care after last session and saw MD-he reports X-rays were taken for back, shoulders, and neck with no acute findings/fracture noted. He reports symptoms suspected as associated with a "pinched nerve" in his neck and  was instructed to schedule MD follow up prn pending symptoms. He reports upper back pain has since eased to a 7/10 (from 10/10). He continues to note pain in his hip more today in posterior aspect toward glut and SI joint as well as weakness in his right leg, feels like it will "give out" at times. Right hip and glut/SI pain 8/10 this AM. He has still been off of pain medications and reports has had some issues with Percocet getting filled due to supply and out of pocker cost issues. Pt. recommended to continue to follow up with MD re: status for prescription medication concerns.              [facility] PT Assessment - 06/20/20 0001      Observation/Other Assessments   Focus on Therapeutic Outcomes (FOTO)  37% limited                         [facility] Adult PT Treatment/Exercise  - 06/20/20 0001      Knee/Hip Exercises: Aerobic   Nustep L4 x 5 min UE/LE      Knee/Hip Exercises: Machines for Strengthening   Cybex Leg Press bilat. LE 45 lbs. 2x10      Knee/Hip Exercises: Standing   Heel Raises Both;20 reps    Heel Raises Limitations Airex    Hip Flexion AROM;Stengthening;Both;20 reps;Knee bent    Hip Flexion Limitations 3 lb. ankle weights    Hip Abduction AROM;Stengthening;Both;2 sets;10 reps;Knee straight    Abduction Limitations 5 lb. ankle weights    Lateral Step Up Right;Left;2 sets;10 reps;Hand Hold: 2;Step Height: 4"    Forward Step Up Right;Left;2 sets;10 reps;Hand Hold: 2;Step Height: 6"    Forward Step Up Limitations cues for foot position and angle of body position to step    Functional Squat Limitations TRX squat 2x10 with chair behind pt. for safety and cueing    Rocker Board 2 minutes    Rocker Board Limitations dynamic balance x 1 min ea. lateral and fw/rev with blur "circle" board    Other Standing Knee Exercises sidestepping "band walks" with green Theraband at ankles back and forth 8 feet x 2      Knee/Hip Exercises: Supine   Hip Adduction Isometric AROM;Strengthening;Both;1 set;15 reps    Bridges AROM;Strengthening;Both;1 set;15 reps    Bridges Limitations legs on reversed incline wedge    Other Supine Knee/Hip Exercises alternating unilat. clamshell green band x 10 ea. bilat.    Other Supine Knee/Hip Exercises posterior pelvic tilts from hooklying 3 sec holds x 15 reps                    PT Short Term Goals - 05/18/20 1307      PT SHORT TERM GOAL #1   Title Independent with initial HEP    Baseline needs updated HEP    Time 3    Period Weeks    Status New      PT SHORT TERM GOAL #2   Title Increase right hip flexion AROM to at least 90 deg (measured in supine) to improve ability for sit<>supine transfers and bed mobility as well as donning shoes    Baseline 60 deg limited by pain    Time 3    Period Weeks    Status New       PT SHORT TERM GOAL #3   Title Increase right knee flexion AROM at least 10 deg to improve ability for  transfers from low seating and stair navigation    Baseline 90 deg    Time 3    Period Weeks    Status New             PT Long Term Goals - 06/20/20 1139      PT LONG TERM GOAL #1   Title Improve FOTO outcome measure score to 41% or less impairment    Baseline 37% limited    Time 6    Period Weeks    Status Achieved      PT LONG TERM GOAL #2   Title Increase right hip, knee and ankle strength grossly 1/2 MMT grade to improve ability for transfers from low chair, lifting for chores and for improve gait stability for outdoor ambulation over uneven surfaces    Baseline ongoing    Time 6    Period Weeks    Status On-going      PT LONG TERM GOAL #3   Title Tolerate standing at least 10-20 minutes for meal preparation and chores with right hip pain 5/10 or less    Baseline ongoing, pain >5/10    Time 6    Period Weeks    Status On-going      PT LONG TERM GOAL #4   Title Improve 5 times sit<>stand time by at least 5 seconds for improve leg strength for improved ability transfers from low seats    Baseline 18 seconds    Time 6    Period Weeks    Status On-going      PT LONG TERM GOAL #5   Title Improve Berg Balance score 5 points to work towards decreased fall risk    Baseline 45/56    Time 6    Period Weeks    Status On-going                 Plan - 06/20/20 1121    Clinical Impression Statement Still with high pain level as noted in subjective but activity tolerance much improved from status last week. Pt. making functional improvements as evidenced by 14% improvement in FOTO score from baseline otherwise/overall progress with goals still ongoing.    Personal Factors and Comorbidities Comorbidity 3+;Time since onset of injury/illness/exacerbation    Comorbidities chronic pain history, previous hip fx. with surgery, SI fusion/chronic back pain and left LE  pain, history TBI with brain surgery, seizures    Examination-Activity Limitations Lift;Stairs;Bed Mobility;Locomotion Level;Stand;Squat;Sit    Examination-Participation Restrictions Cleaning;Shop;Meal Prep;Community Activity;Yard Work    [employer] Evolving/Moderate complexity    Clinical Decision Making Moderate    Rehab Potential Fair    PT Frequency 2x / week    PT Duration 6 weeks    PT Treatment/Interventions ADLs/Self Care Home Management;Electrical Stimulation;Iontophoresis 4mg /ml Dexamethasone;Moist Heat;Therapeutic activities;Patient/family education;Cryotherapy;Therapeutic exercise;Balance training;Gait training;Stair training;Neuromuscular re-education;Manual techniques;Passive range of motion;Dry needling;Functional mobility training    PT Next Visit Plan Pending pain continue hip strengthening and balance work as tolerated, monitor status as needed for newer onset upper back pain    PT Home Exercise Plan Access code: [facility]    Consulted and Agree with Plan of Care Patient           Patient will benefit from skilled therapeutic intervention in order to improve the following deficits and impairments:  Difficulty walking, Decreased balance, Decreased strength, Decreased range of motion, Decreased activity tolerance, Decreased mobility, Abnormal gait, Pain, Impaired sensation  Visit Diagnosis: Pain in right hip  Stiffness of right  hip, not elsewhere classified  Muscle weakness (generalized)  Difficulty in walking, not elsewhere classified     Problem List Patient Active Problem List   Diagnosis Date Noted  . Status post right hip replacement 02/15/2020  . Status post total replacement of right hip 11/22/2019  . Painful orthopaedic hardware ([place]) 11/11/2019  . 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 Weight loss, non-intentional 10/14/2019  . Bilateral leg weakness 09/03/2019  . S/P right hip fracture  07/07/2019  . Right leg pain 06/26/2019  . Femoral fracture ([place]) 06/26/2019  . Hearing loss 05/01/2019  . Chronic left leg pain 11/20/2018  . Allodynia, left leg 11/20/2018  . History of traumatic brain injury 02/23/2018  . High risk medication use, Combination of Opioid and Benzodiazapine 02/12/2018  . 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 SI (sacroiliac) pain 03/20/2017  . Enchondroma of left fibular head 05/17/2016  . Chronic pain syndrome 01/18/2016  . Anxiety state 08/08/2013  . Cerumen impaction 08/06/2013  . Insomnia 02/02/2013  . Right flank pain 05/21/2011  . 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 Encounter for chronic pain management 07/04/2009  . Restless leg 09/29/2007  . Hyperlipidemia 11/13/2006  . Allergic rhinitis 11/13/2006  . GASTROESOPHAGEAL REFLUX, NO ESOPHAGITIS 11/13/2006  . Low back pain 11/13/2006  . Convulsions ([place]) 11/13/2006  . Seizure ([place]) 11/13/2006    Beaulah Dinning, PT, DPT 06/20/20 11:47 AM  [facility] [address] [place], [place], [address] Phone: [phone]   Fax:  [phone]  Name: Richard Glass MRN: [id number] Date of Birth: Mar 04, 1971

## 2020-06-20 NOTE — Therapy (Signed)
[place], [place], [address] Phone: [phone]   Fax:  [phone]  Physical Therapy Treatment  Patient Details  Name: Richard Glass MRN: [id number] Date of Birth: 1970/12/24 Referring Provider (PT): Dwana Melena, PA-C (surgery by Frankey Shown, MD))   Encounter Date: 06/20/2020   PT End of Session - 06/20/20 1120    Visit Number 6    Number of Visits 12    Date for PT Re-Evaluation 06/29/20    Authorization Type [employer] Medicare-add KX due to previous home health PT, progress note by visit 10, recheck FOTO status by visit 10    PT Start Time 1059    PT Stop Time 1143    PT Time Calculation (min) 44 min    Activity Tolerance Patient tolerated treatment well    Behavior During Therapy [facility] for tasks assessed/performed           Past Medical History:  Diagnosis Date  . 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  . Allergic rhinitis 11/13/2006       . Anxiety   . Anxiety state 08/08/2013  . Asthma   . ASTHMA, INTERMITTENT 07/08/2010   Qualifier: Diagnosis of  By: Walker Kehr MD, Patrick Jupiter    . Back pain of lumbar region with sciatica 11/13/2006   MRI 12/2014 - lumbar DDD without focal neural impingement  MRI Lumbar 10/30/16 ([place]) Small central L5-S1 disc extrusion with minimal cranial migration and associated annular fissure approaches descending left S1 nerve roots without contact or displacement. Minimal bulging disc height loss without spinal canal stenosis or neural foraminal narrowing.  Minimal bulging disc at L2-L3 through L4-L5 without spinal canal stenosis or neural foraminal narrowing.  MRI Sacrum 10/30/16 ([place]) No fracture. Small lesion left sacral ala most likely a subchondral cyst/erosion adjacent to the sacroiliac joint.     . 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 Carpal tunnel  syndrome 01/09/2015   Left 12/2014   . Cervical spine pain 02/06/2015   MRI 03/2015 FINDINGS: Vertebral body height, signal and alignment are normal. The craniocervical junction is normal and cervical cord signal is normal. The central spinal canal and neural foramina are widely patent at all levels. Scattered, mild degenerative change appears most notable at C4-5. Imaged paraspinous structures are unremarkable.  IMPRESSION: Negative for central canal or foraminal narrowing. No finding to explain the patient's symptoms. Scattered, mild facet degenerative disease is noted.   . Chronic low back pain   . Chronic pain 01/18/2016  . Chronic pain syndrome 01/18/2016  . Coccyx pain 02/06/2015  . Contact dermatitis or eczema 02/13/2018  . 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   . Degenerative arthritis   . Dyslipidemia   . External hemorrhoid, bleeding 06/04/2011  . GASTROESOPHAGEAL REFLUX, NO ESOPHAGITIS 11/13/2006   Qualifier: Diagnosis of  By: Eusebio Friendly    . Hamstring muscle strain, left, initial encounter 06/26/2018  . Headache(784.0)   . Hyperlipidemia 11/13/2006   07/2016  ASCVD score of 4.7% - recommended lifestyle changes   . Insomnia 02/02/2013  . Intention tremor 10/31/2009   Qualifier: Diagnosis of  By: Walker Kehr MD, Patrick Jupiter    . Intercostal pain 04/25/2015  . Left foot pain 08/21/2018  . Left knee injury 05/09/2016  . 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 Morton's neuroma of left  foot 01/18/2016  . Osteopenia 10/10/2016  . Overweight(278.02) 06/13/2009   Qualifier: Diagnosis of  By: Hedy Camara    . Pain in joint, shoulder region 07/05/2014   LEFT > Right Reports hx rotator cuff surgery on rigt shoulder previously (Dr Nicholes Stairs) but no notes available XRAY right shoulder showed some proliferative changes distal rigt clavicle   . Rash and nonspecific skin eruption 10/10/2016  . Restless leg 09/29/2007   Qualifier: Diagnosis of  By:  Walker Kehr MD, Patrick Jupiter    . Restless legs syndrome (RLS)   . Sciatica of left side 10/26/2014  . Seizures ([place])    last >48yrs  . SI (sacroiliac) pain 03/20/2017  . Sinusitis chronic, frontal   . Status post lumbar spinal fusion 03/20/2017  . Subacromial or subdeltoid bursitis 08/08/2009   MRI C-spine 2011([facility]) - normal MRI left shoulder 10/2010 ([facility]) - AC degenerative disease, normal rotator cuff 12/2012 -debridement, acromioplasty and distal clavicle excision of left shoulder, arthroscopy also performed an no need for rotator cuff repair - performed by [facility] 02/2013 - Nerve Conduction Studies ([facility]) - ulnar nerve compression 02/2013- taken back to OR for Ulnar nerve decompression 06/2013 Repeat MRI left shoulder - subacromial/subdeltoid bursitis, a small intersubstance tear to the distal infraspinatus and evidence of interval resection of the distal clavicle 06/2013 - steroid injection of left biceps 09/2013 -Repeat EMG showed improvement of ulnar nerve compression 10/2013 - referred to [facility] for second opinion due to intractable pain 11/2013 - Seen by Dr. Marlou Sa at [facility]; discussed risks/benefits of surgical intervention and bursectomy, no plan for surgery at this time     . 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 Tubular adenoma of colon 01/31/2020   01/2020 screening colonoscopy ([facility]) found 2 small tubular adenomas (3 mm & 4 mm) - recommend follow up surveillance colonoscopy in 7 years.   . 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 [place]   struck by golf ball-49 yrs old  . CRANIOTOMY     metal plate  . HARDWARE REMOVAL Right 11/22/2019   Procedure: HARDWARE REMOVAL;  Surgeon: Leandrew Koyanagi, MD;  Location: [facility];  Service: Orthopedics;  Laterality: Right;  . HIP PINNING,CANNULATED Right 06/27/2019   Procedure:  HIP PERCUTANEOUS PINNING;  Surgeon: Meredith Pel, MD;  Location: [facility];  Service: Orthopedics;  Laterality: Right;  . NASAL FRACTURE SURGERY  1997  . SACROILIAC JOINT FUSION Left 03/20/2017   Procedure: SACROILIAC JOINT FUSION;  Surgeon: Melina Schools, MD;  Location: [facility];  Service: Orthopedics;  Laterality: Left;  90 mins  . SHOULDER ARTHROSCOPY Left    "cleaned arthritis out"  . SHOULDER SURGERY Right 2012   Rotator cuff surgery  . TONSILLECTOMY    . TOTAL HIP ARTHROPLASTY Right 11/22/2019   Procedure: CONVERSION OF PREVIOUS RIGHT HIP SURGERY TO TOTAL HIP ARTHROPLASTY ANTERIOR APPROACH, HARDWARE REMOVAL;  Surgeon: Leandrew Koyanagi, MD;  Location: [facility];  Service: Orthopedics;  Laterality: Right;  . UVULOPALATOPHARYNGOPLASTY (UPPP)/TONSILLECTOMY/SEPTOPLASTY  2001    There were no vitals filed for this visit.   Subjective Assessment - 06/20/20 1103    Subjective Pt. went to orthopedic urgent care after last session and saw MD-he reports X-rays were taken for back, shoulders, and neck with no acute findings/fracture noted. He reports symptoms suspected as associated with a "pinched nerve" in his neck and  was instructed to schedule MD follow up prn pending symptoms. He reports upper back pain has since eased to a 7/10 (from 10/10). He continues to note pain in his hip more today in posterior aspect toward glut and SI joint as well as weakness in his right leg, feels like it will "give out" at times. Right hip and glut/SI pain 8/10 this AM. He has still been off of pain medications and reports has had some issues with Percocet getting filled due to supply and out of pocker cost issues. Pt. recommended to continue to follow up with MD re: status for prescription medication concerns.              [facility] PT Assessment - 06/20/20 0001      Observation/Other Assessments   Focus on Therapeutic Outcomes (FOTO)  37% limited                         [facility] Adult PT Treatment/Exercise  - 06/20/20 0001      Knee/Hip Exercises: Aerobic   Nustep L4 x 5 min UE/LE      Knee/Hip Exercises: Machines for Strengthening   Cybex Leg Press bilat. LE 45 lbs. 2x10      Knee/Hip Exercises: Standing   Heel Raises Both;20 reps    Heel Raises Limitations Airex    Hip Flexion AROM;Stengthening;Both;20 reps;Knee bent    Hip Flexion Limitations 3 lb. ankle weights    Hip Abduction AROM;Stengthening;Both;2 sets;10 reps;Knee straight    Abduction Limitations 5 lb. ankle weights    Lateral Step Up Right;Left;2 sets;10 reps;Hand Hold: 2;Step Height: 4"    Forward Step Up Right;Left;2 sets;10 reps;Hand Hold: 2;Step Height: 6"    Forward Step Up Limitations cues for foot position and angle of body position to step    Functional Squat Limitations TRX squat 2x10 with chair behind pt. for safety and cueing    Rocker Board 2 minutes    Rocker Board Limitations dynamic balance x 1 min ea. lateral and fw/rev with blur "circle" board    Other Standing Knee Exercises sidestepping "band walks" with green Theraband at ankles back and forth 8 feet x 2      Knee/Hip Exercises: Supine   Hip Adduction Isometric AROM;Strengthening;Both;1 set;15 reps    Bridges AROM;Strengthening;Both;1 set;15 reps    Bridges Limitations legs on reversed incline wedge    Other Supine Knee/Hip Exercises alternating unilat. clamshell green band x 10 ea. bilat.    Other Supine Knee/Hip Exercises posterior pelvic tilts from hooklying 3 sec holds x 15 reps                    PT Short Term Goals - 05/18/20 1307      PT SHORT TERM GOAL #1   Title Independent with initial HEP    Baseline needs updated HEP    Time 3    Period Weeks    Status New      PT SHORT TERM GOAL #2   Title Increase right hip flexion AROM to at least 90 deg (measured in supine) to improve ability for sit<>supine transfers and bed mobility as well as donning shoes    Baseline 60 deg limited by pain    Time 3    Period Weeks    Status New       PT SHORT TERM GOAL #3   Title Increase right knee flexion AROM at least 10 deg to improve ability for  transfers from low seating and stair navigation    Baseline 90 deg    Time 3    Period Weeks    Status New             PT Long Term Goals - 06/20/20 1139      PT LONG TERM GOAL #1   Title Improve FOTO outcome measure score to 41% or less impairment    Baseline 37% limited    Time 6    Period Weeks    Status Achieved      PT LONG TERM GOAL #2   Title Increase right hip, knee and ankle strength grossly 1/2 MMT grade to improve ability for transfers from low chair, lifting for chores and for improve gait stability for outdoor ambulation over uneven surfaces    Baseline ongoing    Time 6    Period Weeks    Status On-going      PT LONG TERM GOAL #3   Title Tolerate standing at least 10-20 minutes for meal preparation and chores with right hip pain 5/10 or less    Baseline ongoing, pain >5/10    Time 6    Period Weeks    Status On-going      PT LONG TERM GOAL #4   Title Improve 5 times sit<>stand time by at least 5 seconds for improve leg strength for improved ability transfers from low seats    Baseline 18 seconds    Time 6    Period Weeks    Status On-going      PT LONG TERM GOAL #5   Title Improve Berg Balance score 5 points to work towards decreased fall risk    Baseline 45/56    Time 6    Period Weeks    Status On-going                 Plan - 06/20/20 1121    Clinical Impression Statement Still with high pain level as noted in subjective but activity tolerance much improved from status last week. Pt. making functional improvements as evidenced by 14% improvement in FOTO score from baseline otherwise/overall progress with goals still ongoing.    Personal Factors and Comorbidities Comorbidity 3+;Time since onset of injury/illness/exacerbation    Comorbidities chronic pain history, previous hip fx. with surgery, SI fusion/chronic back pain and left LE  pain, history TBI with brain surgery, seizures    Examination-Activity Limitations Lift;Stairs;Bed Mobility;Locomotion Level;Stand;Squat;Sit    Examination-Participation Restrictions Cleaning;Shop;Meal Prep;Community Activity;Yard Work    [employer] Evolving/Moderate complexity    Clinical Decision Making Moderate    Rehab Potential Fair    PT Frequency 2x / week    PT Duration 6 weeks    PT Treatment/Interventions ADLs/Self Care Home Management;Electrical Stimulation;Iontophoresis 4mg /ml Dexamethasone;Moist Heat;Therapeutic activities;Patient/family education;Cryotherapy;Therapeutic exercise;Balance training;Gait training;Stair training;Neuromuscular re-education;Manual techniques;Passive range of motion;Dry needling;Functional mobility training    PT Next Visit Plan Pending pain continue hip strengthening and balance work as tolerated, monitor status as needed for newer onset upper back pain    PT Home Exercise Plan Access code: [facility]    Consulted and Agree with Plan of Care Patient           Patient will benefit from skilled therapeutic intervention in order to improve the following deficits and impairments:  Difficulty walking, Decreased balance, Decreased strength, Decreased range of motion, Decreased activity tolerance, Decreased mobility, Abnormal gait, Pain, Impaired sensation  Visit Diagnosis: Pain in right hip  Stiffness of right  hip, not elsewhere classified  Muscle weakness (generalized)  Difficulty in walking, not elsewhere classified     Problem List Patient Active Problem List   Diagnosis Date Noted  . Status post right hip replacement 02/15/2020  . Status post total replacement of right hip 11/22/2019  . Painful orthopaedic hardware ([place]) 11/11/2019  . 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 Weight loss, non-intentional 10/14/2019  . Bilateral leg weakness 09/03/2019  . S/P right hip fracture  07/07/2019  . Right leg pain 06/26/2019  . Femoral fracture ([place]) 06/26/2019  . Hearing loss 05/01/2019  . Chronic left leg pain 11/20/2018  . Allodynia, left leg 11/20/2018  . History of traumatic brain injury 02/23/2018  . High risk medication use, Combination of Opioid and Benzodiazapine 02/12/2018  . 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 SI (sacroiliac) pain 03/20/2017  . Enchondroma of left fibular head 05/17/2016  . Chronic pain syndrome 01/18/2016  . Anxiety state 08/08/2013  . Cerumen impaction 08/06/2013  . Insomnia 02/02/2013  . Right flank pain 05/21/2011  . 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 Encounter for chronic pain management 07/04/2009  . Restless leg 09/29/2007  . Hyperlipidemia 11/13/2006  . Allergic rhinitis 11/13/2006  . GASTROESOPHAGEAL REFLUX, NO ESOPHAGITIS 11/13/2006  . Low back pain 11/13/2006  . Convulsions ([place]) 11/13/2006  . Seizure ([place]) 11/13/2006    Beaulah Dinning, PT, DPT 06/20/20 1:51 PM  [facility] [address] [place], [place], [address] Phone: [phone]   Fax:  [phone]  Name: VENTURA HOLLENBECK MRN: [id number] Date of Birth: May 11, 1971

## 2020-06-21 DIAGNOSIS — Z1211 Encounter for screening for malignant neoplasm of colon: Secondary | ICD-10-CM | POA: Diagnosis not present

## 2020-06-21 DIAGNOSIS — Z01818 Encounter for other preprocedural examination: Secondary | ICD-10-CM | POA: Diagnosis not present

## 2020-06-22 ENCOUNTER — Ambulatory Visit: Payer: Medicare Other | Admitting: Physical Therapy

## 2020-06-22 ENCOUNTER — Encounter: Payer: Self-pay | Admitting: Physical Therapy

## 2020-06-22 ENCOUNTER — Other Ambulatory Visit: Payer: Self-pay

## 2020-06-22 DIAGNOSIS — M25551 Pain in right hip: Secondary | ICD-10-CM | POA: Diagnosis not present

## 2020-06-22 DIAGNOSIS — M6281 Muscle weakness (generalized): Secondary | ICD-10-CM

## 2020-06-22 DIAGNOSIS — R262 Difficulty in walking, not elsewhere classified: Secondary | ICD-10-CM | POA: Diagnosis not present

## 2020-06-22 DIAGNOSIS — M25651 Stiffness of right hip, not elsewhere classified: Secondary | ICD-10-CM
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
## 2020-06-23 DIAGNOSIS — Z1211 Encounter for screening for malignant neoplasm of colon: Secondary | ICD-10-CM | POA: Diagnosis not present

## 2020-06-23 DIAGNOSIS — K2970 Gastritis, unspecified, without bleeding: Secondary | ICD-10-CM | POA: Diagnosis not present

## 2020-06-23 DIAGNOSIS — K900 Celiac disease: Secondary | ICD-10-CM | POA: Diagnosis not present

## 2020-06-23 DIAGNOSIS — A048 Other specified bacterial intestinal infections: Secondary | ICD-10-CM | POA: Diagnosis not present

## 2020-06-23 DIAGNOSIS — Z01818 Encounter for other preprocedural examination: Secondary | ICD-10-CM | POA: Diagnosis not present

## 2020-06-26 ENCOUNTER — Encounter: Payer: Medicare Other | Admitting: Physical Therapy

## 2020-06-29 ENCOUNTER — Other Ambulatory Visit: Payer: Self-pay

## 2020-06-29 ENCOUNTER — Ambulatory Visit: Payer: Medicare Other | Admitting: Physical Therapy

## 2020-06-29 ENCOUNTER — Encounter: Payer: Self-pay | Admitting: Physical Therapy

## 2020-06-29 DIAGNOSIS — M25551 Pain in right hip: Secondary | ICD-10-CM | POA: Diagnosis not present

## 2020-06-29 DIAGNOSIS — M6281 Muscle weakness (generalized): Secondary | ICD-10-CM

## 2020-06-29 DIAGNOSIS — M25651 Stiffness of right hip, not elsewhere classified: Secondary | ICD-10-CM | POA: Diagnosis not present

## 2020-06-29 DIAGNOSIS — R262 Difficulty in walking, not elsewhere classified: Secondary | ICD-10-CM | POA: Diagnosis not present
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
## 2020-06-30 ENCOUNTER — Ambulatory Visit: Payer: Medicare Other | Admitting: Physical Therapy

## 2020-06-30 ENCOUNTER — Telehealth: Payer: Self-pay | Admitting: Physical Therapy

## 2020-06-30 NOTE — Telephone Encounter (Signed)
Called patient to check on status regarding missed therapy appointment this PM. He reports had death in family and had tried to call to cancel visit but number was not working. Confirmed/reviewed correct clinic number for future reference.

## 2020-07-12 DIAGNOSIS — Z96641 Presence of right artificial hip joint: Secondary | ICD-10-CM | POA: Diagnosis not present

## 2020-07-12 DIAGNOSIS — F17210 Nicotine dependence, cigarettes, uncomplicated: Secondary | ICD-10-CM | POA: Diagnosis not present

## 2020-07-12 DIAGNOSIS — M25551 Pain in right hip: Secondary | ICD-10-CM | POA: Diagnosis not present

## 2020-07-12 DIAGNOSIS — Z79899 Other long term (current) drug therapy: Secondary | ICD-10-CM | POA: Diagnosis not present

## 2020-07-12 DIAGNOSIS — G8929 Other chronic pain: Secondary | ICD-10-CM | POA: Diagnosis not present

## 2020-07-13 ENCOUNTER — Ambulatory Visit: Payer: Medicare Other

## 2020-07-17 ENCOUNTER — Telehealth: Payer: Self-pay | Admitting: Physical Therapy

## 2020-07-17 ENCOUNTER — Ambulatory Visit: Payer: Medicare Other | Attending: Physician Assistant | Admitting: Physical Therapy

## 2020-07-17 NOTE — Telephone Encounter (Signed)
Contacted patient regarding 2nd straight no-show appointment. Patient not available. Therapy left voicemail. Per policy last 2 visits will be canceled. His next visit is scheduled for November 3rd. He was advised to please call if he can not make that appointment.

## 2020-07-19 ENCOUNTER — Ambulatory Visit: Payer: Medicare Other | Admitting: Physical Therapy

## 2020-07-24 ENCOUNTER — Encounter: Payer: Medicare Other | Admitting: Physical Therapy

## 2020-07-26 ENCOUNTER — Encounter: Payer: Medicare Other | Admitting: Physical Therapy

## 2020-07-29 ENCOUNTER — Other Ambulatory Visit: Payer: Self-pay | Admitting: Neurology

## 2020-07-31 DIAGNOSIS — M25519 Pain in unspecified shoulder: Secondary | ICD-10-CM | POA: Diagnosis not present

## 2020-07-31 DIAGNOSIS — F17210 Nicotine dependence, cigarettes, uncomplicated: Secondary | ICD-10-CM | POA: Diagnosis not present

## 2020-07-31 DIAGNOSIS — R634 Abnormal weight loss: Secondary | ICD-10-CM | POA: Diagnosis not present

## 2020-07-31 DIAGNOSIS — Z79899 Other long term (current) drug therapy: Secondary | ICD-10-CM | POA: Diagnosis not present

## 2020-07-31 DIAGNOSIS — Z96619 Presence of unspecified artificial shoulder joint: Secondary | ICD-10-CM | POA: Diagnosis not present

## 2020-07-31 DIAGNOSIS — M25551 Pain in right hip: Secondary | ICD-10-CM | POA: Diagnosis not present

## 2020-07-31 DIAGNOSIS — M5416 Radiculopathy, lumbar region: Secondary | ICD-10-CM | POA: Diagnosis not present

## 2020-07-31 DIAGNOSIS — M546 Pain in thoracic spine: Secondary | ICD-10-CM | POA: Diagnosis not present

## 2020-08-01 DIAGNOSIS — R634 Abnormal weight loss: Secondary | ICD-10-CM | POA: Diagnosis not present

## 2020-08-13 ENCOUNTER — Other Ambulatory Visit: Payer: Self-pay | Admitting: Neurology

## 2020-08-16 ENCOUNTER — Encounter: Payer: Self-pay | Admitting: Orthopaedic Surgery

## 2020-08-16 ENCOUNTER — Other Ambulatory Visit: Payer: Self-pay

## 2020-08-16 ENCOUNTER — Ambulatory Visit (INDEPENDENT_AMBULATORY_CARE_PROVIDER_SITE_OTHER): Payer: Medicare Other | Admitting: Orthopaedic Surgery

## 2020-08-16 DIAGNOSIS — M5416 Radiculopathy, lumbar region: Secondary | ICD-10-CM

## 2020-08-16 DIAGNOSIS — M545 Low back pain, unspecified: Secondary | ICD-10-CM

## 2020-08-16 DIAGNOSIS — Z96641 Presence of right artificial hip joint: Secondary | ICD-10-CM | POA: Diagnosis not present

## 2020-08-16 MED ORDER — PREDNISONE 10 MG (21) PO TBPK: ORAL_TABLET | ORAL | 0 refills | Status: DC
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
## 2020-08-17 ENCOUNTER — Other Ambulatory Visit: Payer: Self-pay | Admitting: Physical Medicine and Rehabilitation
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
## 2020-08-28 ENCOUNTER — Telehealth: Payer: Self-pay | Admitting: Neurology

## 2020-08-28 DIAGNOSIS — Z96619 Presence of unspecified artificial shoulder joint: Secondary | ICD-10-CM | POA: Diagnosis not present

## 2020-08-28 DIAGNOSIS — M25519 Pain in unspecified shoulder: Secondary | ICD-10-CM | POA: Diagnosis not present

## 2020-08-28 DIAGNOSIS — Z79899 Other long term (current) drug therapy: Secondary | ICD-10-CM | POA: Diagnosis not present

## 2020-08-28 DIAGNOSIS — Z8781 Personal history of (healed) traumatic fracture: Secondary | ICD-10-CM

## 2020-08-28 DIAGNOSIS — M25551 Pain in right hip: Secondary | ICD-10-CM | POA: Diagnosis not present

## 2020-08-28 DIAGNOSIS — F17210 Nicotine dependence, cigarettes, uncomplicated: Secondary | ICD-10-CM | POA: Diagnosis not present

## 2020-08-28 NOTE — Telephone Encounter (Signed)
Error

## 2020-08-28 NOTE — Telephone Encounter (Signed)
Richard Glass - Please call this patient regarding his medication.He was here August to see Dr. Jannifer Franklin and he changed his medicatoin so he is currently taking Divalproex Sodium the bottle doesn't show how many he should take but when I asked he taked one in the morning and 2 in the afternoon. He is shaking badly and is very concerned. He wants to know if this medication is the cause of the shakes. I see he has an upcoming appt with Clarise Cruz in February. He actually almost fell while in the lobby it looked like a shaking attack. Can he come in earlier than February? I didn't want to reschedule the appt until you spoke with him Thank you

## 2020-08-29 NOTE — Telephone Encounter (Signed)
Called and left message for patient to return call.  

## 2020-08-30 ENCOUNTER — Other Ambulatory Visit: Payer: Self-pay

## 2020-08-30 ENCOUNTER — Encounter (HOSPITAL_COMMUNITY): Payer: Self-pay

## 2020-08-30 ENCOUNTER — Emergency Department (HOSPITAL_COMMUNITY): Payer: No Typology Code available for payment source

## 2020-08-30 ENCOUNTER — Emergency Department (HOSPITAL_COMMUNITY)
Admission: EM | Admit: 2020-08-30 | Discharge: 2020-08-30 | Disposition: A | Discharge status: Home / Self Care | Payer: No Typology Code available for payment source | Attending: Emergency Medicine | Admitting: Emergency Medicine

## 2020-08-30 DIAGNOSIS — J439 Emphysema, unspecified: Secondary | ICD-10-CM | POA: Diagnosis not present

## 2020-08-30 DIAGNOSIS — Z87891 Personal history of nicotine dependence: Secondary | ICD-10-CM | POA: Insufficient documentation

## 2020-08-30 DIAGNOSIS — J0100 Acute maxillary sinusitis, unspecified: Secondary | ICD-10-CM | POA: Diagnosis not present

## 2020-08-30 DIAGNOSIS — R251 Tremor, unspecified: Secondary | ICD-10-CM

## 2020-08-30 DIAGNOSIS — K219 Gastro-esophageal reflux disease without esophagitis: Secondary | ICD-10-CM | POA: Insufficient documentation

## 2020-08-30 DIAGNOSIS — M47812 Spondylosis without myelopathy or radiculopathy, cervical region: Secondary | ICD-10-CM | POA: Diagnosis not present

## 2020-08-30 DIAGNOSIS — Z79899 Other long term (current) drug therapy: Secondary | ICD-10-CM | POA: Diagnosis not present

## 2020-08-30 DIAGNOSIS — S12690A Other displaced fracture of seventh cervical vertebra, initial encounter for closed fracture: Secondary | ICD-10-CM

## 2020-08-30 DIAGNOSIS — I700 Atherosclerosis of aorta: Secondary | ICD-10-CM | POA: Insufficient documentation

## 2020-08-30 DIAGNOSIS — S2221XA Fracture of manubrium, initial encounter for closed fracture: Secondary | ICD-10-CM | POA: Diagnosis not present

## 2020-08-30 DIAGNOSIS — Y92410 Unspecified street and highway as the place of occurrence of the external cause: Secondary | ICD-10-CM | POA: Insufficient documentation

## 2020-08-30 DIAGNOSIS — S93402A Sprain of unspecified ligament of left ankle, initial encounter: Secondary | ICD-10-CM | POA: Diagnosis not present

## 2020-08-30 DIAGNOSIS — J4520 Mild intermittent asthma, uncomplicated: Secondary | ICD-10-CM | POA: Diagnosis not present

## 2020-08-30 DIAGNOSIS — M7989 Other specified soft tissue disorders: Secondary | ICD-10-CM | POA: Diagnosis not present

## 2020-08-30 DIAGNOSIS — R410 Disorientation, unspecified: Secondary | ICD-10-CM | POA: Insufficient documentation

## 2020-08-30 DIAGNOSIS — S2232XA Fracture of one rib, left side, initial encounter for closed fracture: Secondary | ICD-10-CM | POA: Insufficient documentation

## 2020-08-30 DIAGNOSIS — Z96641 Presence of right artificial hip joint: Secondary | ICD-10-CM | POA: Diagnosis not present

## 2020-08-30 DIAGNOSIS — M25572 Pain in left ankle and joints of left foot: Secondary | ICD-10-CM | POA: Diagnosis not present

## 2020-08-30 DIAGNOSIS — S82402A Unspecified fracture of shaft of left fibula, initial encounter for closed fracture: Secondary | ICD-10-CM | POA: Diagnosis not present

## 2020-08-30 DIAGNOSIS — Z86010 Personal history of colonic polyps: Secondary | ICD-10-CM | POA: Diagnosis not present

## 2020-08-30 DIAGNOSIS — K5900 Constipation, unspecified: Secondary | ICD-10-CM | POA: Insufficient documentation

## 2020-08-30 DIAGNOSIS — S12600A Unspecified displaced fracture of seventh cervical vertebra, initial encounter for closed fracture: Secondary | ICD-10-CM | POA: Insufficient documentation

## 2020-08-30 DIAGNOSIS — S0990XA Unspecified injury of head, initial encounter: Secondary | ICD-10-CM | POA: Diagnosis not present

## 2020-08-30 DIAGNOSIS — S299XXA Unspecified injury of thorax, initial encounter: Secondary | ICD-10-CM | POA: Diagnosis present

## 2020-08-30 DIAGNOSIS — Z471 Aftercare following joint replacement surgery: Secondary | ICD-10-CM | POA: Diagnosis not present

## 2020-08-30 DIAGNOSIS — I10 Essential (primary) hypertension: Secondary | ICD-10-CM | POA: Diagnosis not present

## 2020-08-30 DIAGNOSIS — S3991XA Unspecified injury of abdomen, initial encounter: Secondary | ICD-10-CM | POA: Diagnosis not present

## 2020-08-30 DIAGNOSIS — S2220XA Unspecified fracture of sternum, initial encounter for closed fracture: Secondary | ICD-10-CM | POA: Insufficient documentation

## 2020-08-30 DIAGNOSIS — S2243XA Multiple fractures of ribs, bilateral, initial encounter for closed fracture: Secondary | ICD-10-CM | POA: Diagnosis not present

## 2020-08-30 DIAGNOSIS — S2221XB Fracture of manubrium, initial encounter for open fracture: Secondary | ICD-10-CM | POA: Diagnosis not present

## 2020-08-30 DIAGNOSIS — S2249XA Multiple fractures of ribs, unspecified side, initial encounter for closed fracture: Secondary | ICD-10-CM | POA: Diagnosis not present

## 2020-08-30 DIAGNOSIS — R079 Chest pain, unspecified: Secondary | ICD-10-CM | POA: Diagnosis not present

## 2020-08-30 DIAGNOSIS — S22009A Unspecified fracture of unspecified thoracic vertebra, initial encounter for closed fracture: Secondary | ICD-10-CM

## 2020-08-30 DIAGNOSIS — Z85830 Personal history of malignant neoplasm of bone: Secondary | ICD-10-CM | POA: Insufficient documentation

## 2020-08-30 DIAGNOSIS — R413 Other amnesia: Secondary | ICD-10-CM | POA: Diagnosis not present

## 2020-08-30 DIAGNOSIS — R0902 Hypoxemia: Secondary | ICD-10-CM | POA: Diagnosis not present

## 2020-08-30 DIAGNOSIS — R52 Pain, unspecified: Secondary | ICD-10-CM | POA: Diagnosis not present

## 2020-08-30 LAB — PHENOBARBITAL LEVEL
Phenobarbital: 49.2 ug/mL — ABNORMAL HIGH (ref 15.0–30.0)
Phenobarbital: 51.7 ug/mL — ABNORMAL HIGH (ref 15.0–30.0)

## 2020-08-30 LAB — COMPREHENSIVE METABOLIC PANEL
ALT: 21 U/L (ref 0–44)
AST: 25 U/L (ref 15–41)
Albumin: 3.8 g/dL (ref 3.5–5.0)
Alkaline Phosphatase: 47 U/L (ref 38–126)
Anion gap: 9 (ref 5–15)
BUN: 14 mg/dL (ref 6–20)
CO2: 29 mmol/L (ref 22–32)
Calcium: 9.5 mg/dL (ref 8.9–10.3)
Chloride: 102 mmol/L (ref 98–111)
Creatinine, Ser: 0.84 mg/dL (ref 0.61–1.24)
GFR, Estimated: >60 mL/min (ref >60)
Glucose, Bld: 93 mg/dL (ref 70–99)
Potassium: 3.9 mmol/L (ref 3.5–5.1)
Sodium: 140 mmol/L (ref 135–145)
Total Bilirubin: 0.6 mg/dL (ref 0.3–1.2)
Total Protein: 6.3 g/dL — ABNORMAL LOW (ref 6.5–8.1)

## 2020-08-30 LAB — ETHANOL: Alcohol, Ethyl (B): <10 mg/dL (ref <10)

## 2020-08-30 LAB — TROPONIN I (HIGH SENSITIVITY): Troponin I (High Sensitivity): 10 ng/L (ref <18)

## 2020-08-30 LAB — CBC
HCT: 39.1 % (ref 39.0–52.0)
Hemoglobin: 12.9 g/dL — ABNORMAL LOW (ref 13.0–17.0)
MCH: 31.6 pg (ref 26.0–34.0)
MCHC: 33 g/dL (ref 30.0–36.0)
MCV: 95.8 fL (ref 80.0–100.0)
Platelets: 195 10*3/uL (ref 150–400)
RBC: 4.08 MIL/uL — ABNORMAL LOW (ref 4.22–5.81)
RDW: 13 % (ref 11.5–15.5)
WBC: 13.9 10*3/uL — ABNORMAL HIGH (ref 4.0–10.5)
nRBC: 0 % (ref 0.0–0.2)

## 2020-08-30 LAB — LACTIC ACID, PLASMA: Lactic Acid, Venous: 1.4 mmol/L (ref 0.5–1.9)

## 2020-08-30 LAB — SAMPLE TO BLOOD BANK

## 2020-08-30 LAB — PROTIME-INR
INR: 1.2 (ref 0.8–1.2)
Prothrombin Time: 14.8 seconds (ref 11.4–15.2)

## 2020-08-30 LAB — VALPROIC ACID LEVEL: Valproic Acid Lvl: 75 ug/mL (ref 50.0–100.0)

## 2020-08-30 LAB — CK: Total CK: 435 U/L — ABNORMAL HIGH (ref 49–397)

## 2020-08-30 MED ORDER — IOHEXOL 300 MG/ML  SOLN
100.0000 mL | Freq: Once | INTRAMUSCULAR | Status: AC | PRN
  Administered 2020-08-30: 100 mL via INTRAVENOUS

## 2020-08-30 MED ORDER — SODIUM CHLORIDE 0.9 % IV BOLUS
1000.0000 mL | Freq: Once | INTRAVENOUS | Status: AC
  Administered 2020-08-30: 1000 mL via INTRAVENOUS

## 2020-08-30 MED ORDER — MORPHINE SULFATE (PF) 4 MG/ML IV SOLN
4.0000 mg | Freq: Once | INTRAVENOUS | Status: AC
  Administered 2020-08-30: 4 mg via INTRAVENOUS
  Filled 2020-08-30: qty 1

## 2020-08-30 MED ORDER — PHENOBARBITAL 32.4 MG PO TABS: 162.0000 mg | ORAL_TABLET | Freq: Once | ORAL | Status: DC
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
## 2020-08-30 NOTE — Telephone Encounter (Signed)
Left message for patient concerning medication questions as well as follow up on how he is doing?  Unable to reach patient.
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
## 2020-09-04 ENCOUNTER — Telehealth: Payer: Self-pay | Admitting: Physical Medicine and Rehabilitation
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
## 2020-09-04 NOTE — Telephone Encounter (Signed)
Patient is scheduled for S1 TF on 09/19/20. ED visit on 12/15 with imaging performed. Please advise.

## 2020-09-05 ENCOUNTER — Encounter: Payer: Self-pay | Admitting: Neurology

## 2020-09-05 ENCOUNTER — Other Ambulatory Visit: Payer: Self-pay

## 2020-09-05 ENCOUNTER — Ambulatory Visit (INDEPENDENT_AMBULATORY_CARE_PROVIDER_SITE_OTHER): Payer: Medicare Other | Admitting: Neurology

## 2020-09-05 VITALS — BP 117/82 | HR 89

## 2020-09-05 DIAGNOSIS — R561 Post traumatic seizures: Secondary | ICD-10-CM | POA: Diagnosis not present

## 2020-09-05 DIAGNOSIS — G9389 Other specified disorders of brain: Secondary | ICD-10-CM | POA: Diagnosis not present

## 2020-09-05 DIAGNOSIS — Z87820 Personal history of traumatic brain injury: Secondary | ICD-10-CM | POA: Diagnosis not present

## 2020-09-05 MED ORDER — ZONISAMIDE 25 MG PO CAPS: ORAL_CAPSULE | ORAL | 1 refills | Status: DC
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
## 2020-09-05 NOTE — Telephone Encounter (Signed)
I called the patient again and spoke to him. He was actually seen in our office, by Dr. Jannifer Franklin, today.

## 2020-09-05 NOTE — Telephone Encounter (Signed)
Left message to advise

## 2020-09-05 NOTE — Patient Instructions (Signed)
We will start zonegran for the seizures. Once you are on a dose of 3 capsules twice a day for one week, call our office. We will initiate a slow taper off of the depakote.

## 2020-09-12 ENCOUNTER — Telehealth: Payer: Self-pay | Admitting: Neurology

## 2020-09-12 NOTE — Telephone Encounter (Signed)
Called patient and he stated the Zonegran he felt was doing fine, he has had no negative side affects and didn't want to be on 3 seizure medications.  Is aware Dr. Anne Hahn is out of the office this week and may not get an answer til Monday.

## 2020-09-12 NOTE — Telephone Encounter (Signed)
Pt called wanting to discuss his zonisamide (ZONEGRAN) 25 MG capsule with provider. Pt states provider informed him to call the office after a week in use of this medication. Please advise.

## 2020-09-13 DIAGNOSIS — K760 Fatty (change of) liver, not elsewhere classified: Secondary | ICD-10-CM | POA: Diagnosis not present

## 2020-09-14 DIAGNOSIS — S22030A Wedge compression fracture of third thoracic vertebra, initial encounter for closed fracture: Secondary | ICD-10-CM | POA: Insufficient documentation

## 2020-09-14 NOTE — Telephone Encounter (Signed)
I called the patient and left a message.  As we had discussed in the office, the plan is not to keep him on 3 different seizure medications, we were working up on the Zonegran, when he gets to 75 mg twice daily he is to contact our office and I will convert him to 100 mg twice daily of the Zonegran, and we will initiate a taper of the Depakote.

## 2020-09-19 ENCOUNTER — Ambulatory Visit (INDEPENDENT_AMBULATORY_CARE_PROVIDER_SITE_OTHER): Payer: Medicare Other | Admitting: Physical Medicine and Rehabilitation

## 2020-09-19 ENCOUNTER — Other Ambulatory Visit: Payer: Self-pay

## 2020-09-19 ENCOUNTER — Ambulatory Visit: Payer: Self-pay

## 2020-09-19 ENCOUNTER — Ambulatory Visit (INDEPENDENT_AMBULATORY_CARE_PROVIDER_SITE_OTHER): Payer: Medicare Other | Admitting: Orthopaedic Surgery

## 2020-09-19 DIAGNOSIS — M25572 Pain in left ankle and joints of left foot: Secondary | ICD-10-CM | POA: Diagnosis not present

## 2020-09-19 DIAGNOSIS — M5416 Radiculopathy, lumbar region: Secondary | ICD-10-CM
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
## 2020-09-27 ENCOUNTER — Other Ambulatory Visit: Payer: Self-pay | Admitting: Neurology

## 2020-09-28 DIAGNOSIS — M25519 Pain in unspecified shoulder: Secondary | ICD-10-CM | POA: Diagnosis not present

## 2020-09-28 DIAGNOSIS — M25551 Pain in right hip: Secondary | ICD-10-CM | POA: Diagnosis not present

## 2020-09-28 DIAGNOSIS — Z79899 Other long term (current) drug therapy: Secondary | ICD-10-CM | POA: Diagnosis not present

## 2020-09-28 DIAGNOSIS — F17200 Nicotine dependence, unspecified, uncomplicated: Secondary | ICD-10-CM | POA: Diagnosis not present

## 2020-09-28 DIAGNOSIS — Z6820 Body mass index (BMI) 20.0-20.9, adult: Secondary | ICD-10-CM | POA: Diagnosis not present

## 2020-09-28 DIAGNOSIS — G8929 Other chronic pain: Secondary | ICD-10-CM | POA: Diagnosis not present

## 2020-09-28 DIAGNOSIS — Z96619 Presence of unspecified artificial shoulder joint: Secondary | ICD-10-CM | POA: Diagnosis not present

## 2020-10-07 ENCOUNTER — Other Ambulatory Visit: Payer: Self-pay | Admitting: Neurology

## 2020-10-10 ENCOUNTER — Ambulatory Visit: Payer: Self-pay

## 2020-10-10 ENCOUNTER — Ambulatory Visit (INDEPENDENT_AMBULATORY_CARE_PROVIDER_SITE_OTHER): Payer: Medicare Other | Admitting: Orthopaedic Surgery

## 2020-10-10 ENCOUNTER — Telehealth: Payer: Self-pay

## 2020-10-10 ENCOUNTER — Encounter: Payer: Self-pay | Admitting: Orthopaedic Surgery

## 2020-10-10 DIAGNOSIS — M25572 Pain in left ankle and joints of left foot: Secondary | ICD-10-CM | POA: Diagnosis not present

## 2020-10-10 MED ORDER — DICLOFENAC SODIUM 1 % EX GEL: 2.0000 g | Freq: Four times a day (QID) | CUTANEOUS | 0 refills | Status: AC

## 2020-10-10 NOTE — Telephone Encounter (Signed)
Sorry for a doppler r/o DVT. I put the order in and vascular lab was able to see it.

## 2020-10-10 NOTE — Telephone Encounter (Signed)
Patient scheduled at [facility] for 01/26 at 10am

## 2020-10-10 NOTE — Telephone Encounter (Signed)
I don't see a study.referral on this one.  Scheduled for what study?
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
## 2020-10-10 NOTE — Telephone Encounter (Signed)
I see it!  Thanks, this one is [facility] so should be all good.  No auth should be needed.

## 2020-10-11 ENCOUNTER — Ambulatory Visit (HOSPITAL_COMMUNITY)
Admission: RE | Admit: 2020-10-11 | Discharge: 2020-10-11 | Disposition: A | Discharge status: Home / Self Care | Payer: Medicare Other | Source: Ambulatory Visit | Attending: Physician Assistant | Admitting: Physician Assistant

## 2020-10-11 ENCOUNTER — Other Ambulatory Visit: Payer: Self-pay

## 2020-10-11 DIAGNOSIS — M25572 Pain in left ankle and joints of left foot: Secondary | ICD-10-CM | POA: Insufficient documentation

## 2020-10-11 NOTE — Progress Notes (Signed)
Left lower extremity venous duplex has been completed. Preliminary results can be found in CV Proc through chart review.  Results were given to [facility].  10/11/20 9:46 AM Carlos Levering RVT

## 2020-10-12 DIAGNOSIS — S22030A Wedge compression fracture of third thoracic vertebra, initial encounter for closed fracture: Secondary | ICD-10-CM | POA: Diagnosis not present

## 2020-10-18 ENCOUNTER — Encounter: Payer: Self-pay | Admitting: Neurology

## 2020-10-18 ENCOUNTER — Other Ambulatory Visit: Payer: Self-pay

## 2020-10-18 ENCOUNTER — Ambulatory Visit (INDEPENDENT_AMBULATORY_CARE_PROVIDER_SITE_OTHER): Payer: Medicare Other | Admitting: Neurology

## 2020-10-18 VITALS — BP 123/77 | HR 59 | Ht 68.0 in | Wt 141.0 lb

## 2020-10-18 DIAGNOSIS — R561 Post traumatic seizures: Secondary | ICD-10-CM

## 2020-10-18 DIAGNOSIS — Z87820 Personal history of traumatic brain injury: Secondary | ICD-10-CM | POA: Diagnosis not present

## 2020-10-18 DIAGNOSIS — R569 Unspecified convulsions: Secondary | ICD-10-CM | POA: Diagnosis not present

## 2020-10-18 MED ORDER — PHENOBARBITAL 64.8 MG PO TABS: 194.4000 mg | ORAL_TABLET | Freq: Every day | ORAL | 1 refills | Status: DC

## 2020-10-18 MED ORDER — ZONISAMIDE 100 MG PO CAPS: 100.0000 mg | ORAL_CAPSULE | Freq: Two times a day (BID) | ORAL | 3 refills | Status: DC
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
## 2020-10-18 NOTE — Patient Instructions (Signed)
Increase Zonegran 100 mg twice daily  Continue the phenobarbital  Check labs today  See you back 4 months  No driving until seizure free 6 months

## 2020-10-18 NOTE — Progress Notes (Signed)
I have read the note, and I agree with the clinical assessment and plan.  Jamere Stidham K Yareth Macdonnell   

## 2020-10-19 ENCOUNTER — Other Ambulatory Visit: Payer: Self-pay | Admitting: Neurology

## 2020-10-19 LAB — COMPREHENSIVE METABOLIC PANEL
ALT: 10 IU/L (ref 0–44)
AST: 12 IU/L (ref 0–40)
Albumin/Globulin Ratio: 2.7 — ABNORMAL HIGH (ref 1.2–2.2)
Albumin: 4.8 g/dL (ref 3.8–4.9)
Alkaline Phosphatase: 88 IU/L (ref 44–121)
BUN/Creatinine Ratio: 10 (ref 9–20)
BUN: 9 mg/dL (ref 6–24)
Bilirubin Total: 0.2 mg/dL (ref 0.0–1.2)
CO2: 27 mmol/L (ref 20–29)
Calcium: 9.8 mg/dL (ref 8.7–10.2)
Chloride: 101 mmol/L (ref 96–106)
Creatinine, Ser: 0.92 mg/dL (ref 0.76–1.27)
GFR calc Af Amer: 111 mL/min/{1.73_m2} (ref >59)
GFR calc non Af Amer: 96 mL/min/{1.73_m2} (ref >59)
Globulin, Total: 1.8 g/dL (ref 1.5–4.5)
Glucose: 64 mg/dL — ABNORMAL LOW (ref 65–99)
Potassium: 4.5 mmol/L (ref 3.5–5.2)
Sodium: 142 mmol/L (ref 134–144)
Total Protein: 6.6 g/dL (ref 6.0–8.5)

## 2020-10-19 LAB — CBC WITH DIFFERENTIAL/PLATELET
Basophils Absolute: 0 10*3/uL (ref 0.0–0.2)
Basos: 0 %
EOS (ABSOLUTE): 0 10*3/uL (ref 0.0–0.4)
Eos: 1 %
Hematocrit: 38.6 % (ref 37.5–51.0)
Hemoglobin: 12.9 g/dL — ABNORMAL LOW (ref 13.0–17.7)
Immature Grans (Abs): 0 10*3/uL (ref 0.0–0.1)
Immature Granulocytes: 0 %
Lymphocytes Absolute: 1.6 10*3/uL (ref 0.7–3.1)
Lymphs: 27 %
MCH: 31.2 pg (ref 26.6–33.0)
MCHC: 33.4 g/dL (ref 31.5–35.7)
MCV: 94 fL (ref 79–97)
Monocytes Absolute: 0.4 10*3/uL (ref 0.1–0.9)
Monocytes: 6 %
Neutrophils Absolute: 3.8 10*3/uL (ref 1.4–7.0)
Neutrophils: 66 %
Platelets: 278 10*3/uL (ref 150–450)
RBC: 4.13 x10E6/uL — ABNORMAL LOW (ref 4.14–5.80)
RDW: 12.3 % (ref 11.6–15.4)
WBC: 5.8 10*3/uL (ref 3.4–10.8)

## 2020-10-19 LAB — ZONISAMIDE LEVEL: Zonisamide: 4 ug/mL — ABNORMAL LOW (ref 10.0–40.0)

## 2020-10-19 LAB — PHENOBARBITAL LEVEL: Phenobarbital, Serum: 34 ug/mL (ref 15–40)

## 2020-10-24 ENCOUNTER — Other Ambulatory Visit: Payer: Self-pay | Admitting: Neurology

## 2020-10-24 MED ORDER — ALPRAZOLAM 1 MG PO TABS: ORAL_TABLET | ORAL | 2 refills | Status: DC

## 2020-10-24 NOTE — Telephone Encounter (Signed)
Pt has called for a refill on his ALPRAZolam (XANAX) 1 MG tablet to [id number]

## 2020-10-24 NOTE — Addendum Note (Signed)
Addended by: Brandon Melnick on: 10/24/2020 03:13 PM   Modules accepted: Orders

## 2020-10-25 ENCOUNTER — Telehealth: Payer: Self-pay

## 2020-10-25 ENCOUNTER — Telehealth: Payer: Self-pay | Admitting: Neurology

## 2020-10-25 NOTE — Telephone Encounter (Signed)
I called pt and after calling [facility] they said the zonisamide 100mg  po bid does not need PA, should be able to pick up.  I relayed to pt.  He verbalized understanding.

## 2020-10-25 NOTE — Telephone Encounter (Signed)
Pt verified by name and DOB, results given per provider,  - pt states he only eats once or twice a day due to lack of appetite, encouraged him to inform pcp and I will notify SS, NP.    Pt voiced understanding

## 2020-10-25 NOTE — Telephone Encounter (Signed)
-----   Message from [name], NP sent at 10/23/2020  8:26 AM EST ----- Please call patient.  Phenobarbital level was therapeutic, Zonegran was low, at last visit, we increased the dose to 100 mg twice a day, hopefully will improve drug level.  He is to call if he has any recurrent seizures.  Glucose level was low, 64, make sure eating on regular intervals, not sure if he was fasting?  I will see him back in a few months, call if he has any problems.

## 2020-10-25 NOTE — Telephone Encounter (Signed)
Pt. states that pharmacy has told him he needs zonisamide (ZONEGRAN) 100 MG capsule authorized before he can get it filled.  [facility]armacy[id number]

## 2020-10-26 ENCOUNTER — Other Ambulatory Visit: Payer: Self-pay | Admitting: Neurology

## 2020-10-27 DIAGNOSIS — Z79899 Other long term (current) drug therapy: Secondary | ICD-10-CM | POA: Diagnosis not present

## 2020-10-27 DIAGNOSIS — M25551 Pain in right hip: Secondary | ICD-10-CM | POA: Diagnosis not present

## 2020-10-27 DIAGNOSIS — M25519 Pain in unspecified shoulder: Secondary | ICD-10-CM | POA: Diagnosis not present

## 2020-10-27 DIAGNOSIS — G8929 Other chronic pain: Secondary | ICD-10-CM | POA: Diagnosis not present

## 2020-10-27 DIAGNOSIS — S82892D Other fracture of left lower leg, subsequent encounter for closed fracture with routine healing: Secondary | ICD-10-CM | POA: Diagnosis not present

## 2020-10-27 DIAGNOSIS — Z6820 Body mass index (BMI) 20.0-20.9, adult: Secondary | ICD-10-CM | POA: Diagnosis not present

## 2020-10-27 DIAGNOSIS — F17200 Nicotine dependence, unspecified, uncomplicated: Secondary | ICD-10-CM | POA: Diagnosis not present

## 2020-10-30 ENCOUNTER — Other Ambulatory Visit: Payer: Self-pay | Admitting: Physician Assistant

## 2020-11-06 ENCOUNTER — Ambulatory Visit: Payer: Self-pay

## 2020-11-06 ENCOUNTER — Ambulatory Visit: Payer: Medicare Other | Admitting: Physical Medicine and Rehabilitation

## 2020-11-06 ENCOUNTER — Encounter: Payer: Self-pay | Admitting: Physical Medicine and Rehabilitation

## 2020-11-06 ENCOUNTER — Other Ambulatory Visit: Payer: Self-pay

## 2020-11-06 VITALS — BP 125/77 | HR 63

## 2020-11-06 DIAGNOSIS — M5416 Radiculopathy, lumbar region: Secondary | ICD-10-CM

## 2020-11-06 MED ORDER — BETAMETHASONE SOD PHOS & ACET 6 (3-3) MG/ML IJ SUSP: 12.0000 mg | Freq: Once | INTRAMUSCULAR | Status: DC

## 2020-11-06 NOTE — Progress Notes (Signed)
Richard Glass - 50 y.o. male MRN [id number]  Date of birth: [birth year]  Office Visit Note: Visit Date: 11/06/2020 PCP: Riki Sheer, NP Referred by: Riki Sheer, NP  Subjective: Chief Complaint  Patient presents with  . Lower Back - Pain  . Middle Back - Pain  . Left Leg - Pain  . Left Foot - Pain   HPI:  Richard Glass is a 50 y.o. male who comes in today at the request of Dr. Eduard Roux for planned Left S1-2 Lumbar epidural steroid injection with fluoroscopic guidance.  The patient has failed conservative care including home exercise, medications, time and activity modification.  This injection will be diagnostic and hopefully therapeutic.  Please see requesting physician notes for further details and justification. MRI reviewed with images and spine model.  MRI reviewed in the note below.    ROS Otherwise per HPI.  Assessment & Plan: Visit Diagnoses:    ICD-10-CM   1. Lumbar radiculopathy  M54.16 XR C-ARM NO REPORT    Epidural Steroid injection    betamethasone acetate-betamethasone sodium phosphate (CELESTONE) injection 12 mg    Plan: No additional findings.   Meds & Orders:  Meds ordered this encounter  Medications  . betamethasone acetate-betamethasone sodium phosphate (CELESTONE) injection 12 mg    Orders Placed This Encounter  Procedures  . XR C-ARM NO REPORT  . Epidural Steroid injection    Follow-up: Return for visit to requesting physician as needed.   Procedures: No procedures performed  S1 Lumbosacral Transforaminal Epidural Steroid Injection - Sub-Pedicular Approach with Fluoroscopic Guidance   Patient: Richard Glass      Date of Birth: 02/06/1971 MRN: [id number] PCP: Riki Sheer, NP      Visit Date: 11/06/2020   Universal Protocol:    Date/Time: 02/22/225:54 AM  Consent Given By: the patient  Position:  PRONE  Additional Comments: Vital signs were monitored before and after the procedure. Patient was prepped and  draped in the usual sterile fashion. The correct patient, procedure, and site was verified.   Injection Procedure Details:  Procedure Site One Meds Administered:  Meds ordered this encounter  Medications  . betamethasone acetate-betamethasone sodium phosphate (CELESTONE) injection 12 mg    Laterality: Left  Location/Site:  S1 Foramen   Needle size: 22 ga.  Needle type: Spinal  Needle Placement: Transforaminal  Findings:   -Comments: Excellent flow of contrast along the nerve, nerve root and into the epidural space.  Epidurogram: Contrast epidurogram showed no nerve root cut off or restricted flow pattern.  Procedure Details: After squaring off the sacral end-plate to get a true AP view, the C-arm was positioned so that the best possible view of the S1 foramen was visualized. The soft tissues overlying this structure were infiltrated with 2-3 ml. of 1% Lidocaine without Epinephrine.    The spinal needle was inserted toward the target using a "trajectory" view along the fluoroscope beam.  Under AP and lateral visualization, the needle was advanced so it did not puncture dura. Biplanar projections were used to confirm position. Aspiration was confirmed to be negative for CSF and/or blood. A 1-2 ml. volume of Isovue-250 was injected and flow of contrast was noted at each level. Radiographs were obtained for documentation purposes.   After attaining the desired flow of contrast documented above, a 0.5 to 1.0 ml test dose of 0.25% Marcaine was injected into each respective transforaminal space.  The patient was observed for 90 seconds post injection.  After no sensory deficits were reported, and normal lower extremity motor function was noted,   the above injectate was administered so that equal amounts of the injectate were placed at each foramen (level) into the transforaminal epidural space.   Additional Comments:  The patient tolerated the procedure well Dressing: Band-Aid with  2 x 2 sterile gauze    Post-procedure details: Patient was observed during the procedure. Post-procedure instructions were reviewed.  Patient left the clinic in stable condition.     Clinical History: MRI LUMBAR SPINE WITHOUT CONTRAST  TECHNIQUE: Multiplanar, multisequence MR imaging of the lumbar spine was performed. No intravenous contrast was administered.  COMPARISON:  Previous MRI from 11/05/2014.  FINDINGS: Segmentation: Standard. Multiple formed disc space labeled the L5-S1 level.  Alignment: Physiologic preservation of the normal lumbar lordosis. No listhesis.  Vertebrae: Vertebral body height maintained without evidence for acute or chronic fracture. Bone marrow signal intensity diffusely heterogeneous without discrete or worrisome osseous lesion. No abnormal marrow edema. Susceptibility artifact from multiple transverse fixation screws traversing the left SI joint noted.  Conus medullaris and cauda equina: Conus extends to the T12 level. Conus and cauda equina appear normal.  Paraspinal and other soft tissues: Paraspinous soft tissues within normal limits. Few scattered subcentimeter simple cyst noted within the right kidney. Visualized visceral structures otherwise unremarkable.  Disc levels:  L1-2:  Unremarkable.  L2-3: Disc desiccation without significant disc bulge. No stenosis or impingement.  L3-4:  Unremarkable.  L4-5: Negative interspace. Mild facet hypertrophy. No canal or foraminal stenosis.  L5-S1: Disc desiccation. Small central disc protrusion with associated annular fissure. Protruding disc closely approximates the descending S1 nerve roots without frank neural impingement or displacement. No significant spinal stenosis. Lateral recesses remain patent. No significant foraminal narrowing.  IMPRESSION: 1. Small central disc protrusion at L5-S1, closely approximating and potentially irritating either of the descending S1  nerve roots without frank neural impingement or displacement. 2. Mild facet hypertrophy at L4-5 without stenosis or impingement. 3. Otherwise negative MRI of the lumbar spine. No other findings to explain patient's right lower extremity radicular symptoms identified.   Electronically Signed   By: Jeannine Boga M.D.   On: 10/02/2019 21:35     Objective:  VS:  HT:    WT:   BMI:     BP:125/77  HR:63bpm  TEMP: ( )  RESP:  Physical Exam Vitals and nursing note reviewed.  Constitutional:      General: He is not in acute distress.    Appearance: Normal appearance. He is not ill-appearing.  HENT:     Head: Normocephalic and atraumatic.     Right Ear: External ear normal.     Left Ear: External ear normal.     Nose: No congestion.  Eyes:     Extraocular Movements: Extraocular movements intact.  Cardiovascular:     Rate and Rhythm: Normal rate.     Pulses: Normal pulses.  Pulmonary:     Effort: Pulmonary effort is normal. No respiratory distress.  Abdominal:     General: There is no distension.     Palpations: Abdomen is soft.  Musculoskeletal:        General: No tenderness or signs of injury.     Cervical back: Neck supple.     Right lower leg: No edema.     Left lower leg: No edema.     Comments: Patient has good distal strength without clonus.  He ambulates very slowly with a cane.  He has a brace  on his right ankle.  He is wearing a lumbar corset brace as well.  Skin:    Findings: No erythema or rash.  Neurological:     General: No focal deficit present.     Mental Status: He is alert and oriented to person, place, and time.     Sensory: No sensory deficit.     Motor: No weakness or abnormal muscle tone.     Coordination: Coordination normal.  Psychiatric:        Mood and Affect: Mood normal.        Behavior: Behavior normal.      Imaging: XR C-ARM NO REPORT  Result Date: 11/06/2020 Please see Notes tab for imaging impression.

## 2020-11-06 NOTE — Progress Notes (Signed)
Pt state back pain that travels down to his left leg and foot. Pt state walking, standing and sitting for a long time makes the pain worse. Pt state he takes pain meds and uses heating pads to help ease the pain.  Numeric Pain Rating Scale and Functional Assessment Average Pain 9   In the last MONTH (on 0-10 scale) has pain interfered with the following?  1. General activity like being  able to carry out your everyday physical activities such as walking, climbing stairs, carrying groceries, or moving a chair?  Rating(10)   +Driver, -BT, -Dye Allergies.

## 2020-11-07 ENCOUNTER — Ambulatory Visit: Payer: Self-pay

## 2020-11-07 ENCOUNTER — Ambulatory Visit (INDEPENDENT_AMBULATORY_CARE_PROVIDER_SITE_OTHER): Payer: Medicare Other | Admitting: Orthopaedic Surgery

## 2020-11-07 ENCOUNTER — Encounter: Payer: Self-pay | Admitting: Orthopaedic Surgery

## 2020-11-07 DIAGNOSIS — M25572 Pain in left ankle and joints of left foot: Secondary | ICD-10-CM

## 2020-11-07 NOTE — Procedures (Signed)
S1 Lumbosacral Transforaminal Epidural Steroid Injection - Sub-Pedicular Approach with Fluoroscopic Guidance   Patient: Richard Glass      Date of Birth: Oct 24, 1970 MRN: [id number] PCP: Riki Sheer, NP      Visit Date: 11/06/2020   Universal Protocol:    Date/Time: 02/22/225:54 AM  Consent Given By: the patient  Position:  PRONE  Additional Comments: Vital signs were monitored before and after the procedure. Patient was prepped and draped in the usual sterile fashion. The correct patient, procedure, and site was verified.   Injection Procedure Details:  Procedure Site One Meds Administered:  Meds ordered this encounter  Medications  . betamethasone acetate-betamethasone sodium phosphate (CELESTONE) injection 12 mg    Laterality: Left  Location/Site:  S1 Foramen   Needle size: 22 ga.  Needle type: Spinal  Needle Placement: Transforaminal  Findings:   -Comments: Excellent flow of contrast along the nerve, nerve root and into the epidural space.  Epidurogram: Contrast epidurogram showed no nerve root cut off or restricted flow pattern.  Procedure Details: After squaring off the sacral end-plate to get a true AP view, the C-arm was positioned so that the best possible view of the S1 foramen was visualized. The soft tissues overlying this structure were infiltrated with 2-3 ml. of 1% Lidocaine without Epinephrine.    The spinal needle was inserted toward the target using a "trajectory" view along the fluoroscope beam.  Under AP and lateral visualization, the needle was advanced so it did not puncture dura. Biplanar projections were used to confirm position. Aspiration was confirmed to be negative for CSF and/or blood. A 1-2 ml. volume of Isovue-250 was injected and flow of contrast was noted at each level. Radiographs were obtained for documentation purposes.   After attaining the desired flow of contrast documented above, a 0.5 to 1.0 ml test dose of 0.25%  Marcaine was injected into each respective transforaminal space.  The patient was observed for 90 seconds post injection.  After no sensory deficits were reported, and normal lower extremity motor function was noted,   the above injectate was administered so that equal amounts of the injectate were placed at each foramen (level) into the transforaminal epidural space.   Additional Comments:  The patient tolerated the procedure well Dressing: Band-Aid with 2 x 2 sterile gauze    Post-procedure details: Patient was observed during the procedure. Post-procedure instructions were reviewed.  Patient left the clinic in stable condition.
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
## 2020-11-14 ENCOUNTER — Ambulatory Visit: Payer: Medicare Other | Admitting: Orthopaedic Surgery

## 2020-11-16 DIAGNOSIS — S22030A Wedge compression fracture of third thoracic vertebra, initial encounter for closed fracture: Secondary | ICD-10-CM | POA: Diagnosis not present

## 2020-11-17 ENCOUNTER — Other Ambulatory Visit: Payer: Self-pay | Admitting: Student

## 2020-11-17 ENCOUNTER — Other Ambulatory Visit: Payer: Self-pay

## 2020-11-17 ENCOUNTER — Telehealth: Payer: Self-pay | Admitting: Orthopaedic Surgery

## 2020-11-17 DIAGNOSIS — M25572 Pain in left ankle and joints of left foot: Secondary | ICD-10-CM

## 2020-11-17 DIAGNOSIS — S22030A Wedge compression fracture of third thoracic vertebra, initial encounter for closed fracture: Secondary | ICD-10-CM

## 2020-11-17 NOTE — Telephone Encounter (Signed)
Patient called advised he was set up for (PT) Spoke with [place] and she do not have an order yet. The number to contact patient is   [phone]

## 2020-11-17 NOTE — Telephone Encounter (Signed)
Order made. Dr Erlinda Hong probably forgot to put the order in. Sorry.

## 2020-11-20 NOTE — Telephone Encounter (Signed)
Order sent to PT [address]

## 2020-11-24 DIAGNOSIS — M542 Cervicalgia: Secondary | ICD-10-CM | POA: Diagnosis not present

## 2020-11-24 DIAGNOSIS — G8929 Other chronic pain: Secondary | ICD-10-CM | POA: Diagnosis not present

## 2020-11-24 DIAGNOSIS — Z79899 Other long term (current) drug therapy: Secondary | ICD-10-CM | POA: Diagnosis not present

## 2020-11-24 DIAGNOSIS — M25519 Pain in unspecified shoulder: Secondary | ICD-10-CM | POA: Diagnosis not present

## 2020-11-24 DIAGNOSIS — M25551 Pain in right hip: Secondary | ICD-10-CM | POA: Diagnosis not present

## 2020-12-02 ENCOUNTER — Other Ambulatory Visit: Payer: Self-pay

## 2020-12-02 ENCOUNTER — Ambulatory Visit
Admission: RE | Admit: 2020-12-02 | Discharge: 2020-12-02 | Disposition: A | Discharge status: Home / Self Care | Payer: Medicare Other | Source: Ambulatory Visit | Attending: Student | Admitting: Student

## 2020-12-02 DIAGNOSIS — S22030A Wedge compression fracture of third thoracic vertebra, initial encounter for closed fracture: Secondary | ICD-10-CM | POA: Diagnosis not present

## 2020-12-02 DIAGNOSIS — S22020A Wedge compression fracture of second thoracic vertebra, initial encounter for closed fracture: Secondary | ICD-10-CM | POA: Diagnosis not present

## 2020-12-02 DIAGNOSIS — J432 Centrilobular emphysema: Secondary | ICD-10-CM | POA: Diagnosis not present

## 2020-12-05 ENCOUNTER — Encounter: Payer: Self-pay | Admitting: Orthopaedic Surgery

## 2020-12-05 ENCOUNTER — Ambulatory Visit: Payer: Self-pay

## 2020-12-05 ENCOUNTER — Ambulatory Visit (INDEPENDENT_AMBULATORY_CARE_PROVIDER_SITE_OTHER): Payer: Medicare Other | Admitting: Orthopaedic Surgery

## 2020-12-05 ENCOUNTER — Other Ambulatory Visit: Payer: Self-pay

## 2020-12-05 DIAGNOSIS — Z96641 Presence of right artificial hip joint: Secondary | ICD-10-CM

## 2020-12-05 DIAGNOSIS — R030 Elevated blood-pressure reading, without diagnosis of hypertension: Secondary | ICD-10-CM | POA: Diagnosis not present

## 2020-12-05 DIAGNOSIS — S22030A Wedge compression fracture of third thoracic vertebra, initial encounter for closed fracture: Secondary | ICD-10-CM | POA: Diagnosis not present

## 2020-12-05 NOTE — Progress Notes (Signed)
Office Visit Note   Patient: Richard Glass           Date of Birth: 1971/01/03           MRN: [id number] Visit Date: 12/05/2020              Requested by: Riki Sheer, NP [place],  [place] [address] PCP: Riki Sheer, NP   Assessment & Plan: Visit Diagnoses:  1. Status post total hip replacement, right     Plan: Impression is 1 year status post right total hip replacement and 3 months status post left ankle injury.  In regards to the right hip, he is doing well.  He will continue to advance with activity as tolerated.  In regards to the left ankle, we have provided him with the phone number to physical therapy as I think it will be better for him to call them as they have been unable to reach him.  He will follow up with [place] in 2 months time for recheck.  Follow-Up Instructions: Return in about 2 months (around 02/04/2021).   Orders:  Orders Placed This Encounter  Procedures  . XR HIP UNILAT W OR W/O PELVIS 2-3 VIEWS RIGHT   No orders of the defined types were placed in this encounter.     Procedures: No procedures performed   Clinical Data: No additional findings.   Subjective: Chief Complaint  Patient presents with  . Left Ankle - Pain  . Right Hip - Pain, Follow-up    HPI patient is a pleasant 50 year old gentleman who comes in today with continued left ankle pain.  Overall, he is much better but still complains of soreness worse with ambulation.  He is taking narcotic pain medication which he gets from pain management.  He is ambulating in an ASO brace.  He has not been to physical therapy yet.  He is also here for his yearly follow-up of his right total hip replacement.  He is doing well.  No complaints of right hip pain.  Review of Systems as detailed in HPI.  All others reviewed and are negative.   Objective: Vital Signs: There were no vitals taken for this visit.  Physical Exam well-developed well-nourished gentleman no acute  distress.  Alert and oriented x3.  Ortho Exam examination of his right hip reveals a negative logroll.  Full hip flexion.  Left ankle exam shows no swelling.  Mild tenderness to the anterolateral ankle.  Slightly limited range of motion.  He is neurovascularly intact distally.  Specialty Comments:  No specialty comments available.  Imaging: XR HIP UNILAT W OR W/O PELVIS 2-3 VIEWS RIGHT  Result Date: 12/05/2020 Well-seated prosthesis without complication    PMFS History: Patient Active Problem List   Diagnosis Date Noted  . Wedge compression fracture of third thoracic vertebra, initial encounter for closed fracture ([place]) 09/14/2020  . Status post right hip replacement 02/15/2020  . Status post total replacement of right hip 11/22/2019  . Painful orthopaedic hardware ([place]) 11/11/2019  . 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 Weight loss, non-intentional 10/14/2019  . Bilateral leg weakness 09/03/2019  . S/P right hip fracture 07/07/2019  . Right leg pain 06/26/2019  . Femoral fracture ([place]) 06/26/2019  . Hearing loss 05/01/2019  . Chronic left leg pain 11/20/2018  . Allodynia, left leg 11/20/2018  . History of traumatic brain injury 02/23/2018  . High risk medication use, Combination of Opioid and  Benzodiazapine 02/12/2018  . 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 SI (sacroiliac) pain 03/20/2017  . Enchondroma of left fibular head 05/17/2016  . Chronic pain syndrome 01/18/2016  . Anxiety state 08/08/2013  . Cerumen impaction 08/06/2013  . Insomnia 02/02/2013  . Right flank pain 05/21/2011  . 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 Encounter for chronic pain management 07/04/2009  . Restless leg 09/29/2007  . Hyperlipidemia 11/13/2006  . Allergic rhinitis 11/13/2006  . GASTROESOPHAGEAL REFLUX, NO ESOPHAGITIS 11/13/2006  . Low back pain 11/13/2006  . Convulsions ([place]) 11/13/2006  . Seizure ([place])  11/13/2006   Past Medical History:  Diagnosis Date  . 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  . Allergic rhinitis 11/13/2006       . Anxiety   . Anxiety state 08/08/2013  . Asthma   . ASTHMA, INTERMITTENT 07/08/2010   Qualifier: Diagnosis of  By: Walker Kehr MD, Patrick Jupiter    . Back pain of lumbar region with sciatica 11/13/2006   MRI 12/2014 - lumbar DDD without focal neural impingement  MRI Lumbar 10/30/16 ([facility]) Small central L5-S1 disc extrusion with minimal cranial migration and associated annular fissure approaches descending left S1 nerve roots without contact or displacement. Minimal bulging disc height loss without spinal canal stenosis or neural foraminal narrowing.  Minimal bulging disc at L2-L3 through L4-L5 without spinal canal stenosis or neural foraminal narrowing.  MRI Sacrum 10/30/16 ([facility]) No fracture. Small lesion left sacral ala most likely a subchondral cyst/erosion adjacent to the sacroiliac joint.     . 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 Carpal tunnel syndrome 01/09/2015   Left 12/2014   . Cervical spine pain 02/06/2015   MRI 03/2015 FINDINGS: Vertebral body height, signal and alignment are normal. The craniocervical junction is normal and cervical cord signal is normal. The central spinal canal and neural foramina are widely patent at all levels. Scattered, mild degenerative change appears most notable at C4-5. Imaged paraspinous structures are unremarkable.  IMPRESSION: Negative for central canal or foraminal narrowing. No finding to explain the patient's symptoms. Scattered, mild facet degenerative disease is noted.   . Chronic low back pain   . Chronic pain 01/18/2016  . Chronic pain syndrome 01/18/2016  . Coccyx pain 02/06/2015  . Contact dermatitis or eczema 02/13/2018  . Convulsions ([place]) 11/13/2006     Cystic encephalomalacia left temporal and  parietal regions from remote injury.   . Degenerative arthritis   . Dyslipidemia   . External hemorrhoid, bleeding 06/04/2011  . GASTROESOPHAGEAL REFLUX, NO ESOPHAGITIS 11/13/2006   Qualifier: Diagnosis of  By: Eusebio [place]    . Hamstring muscle strain, left, initial encounter 06/26/2018  . Headache(784.0)   . Hyperlipidemia 11/13/2006   07/2016  ASCVD score of 4.7% - recommended lifestyle changes   . Insomnia 02/02/2013  . Intention tremor 10/31/2009   Qualifier: Diagnosis of  By: Walker Kehr MD, Patrick Jupiter    . Intercostal pain 04/25/2015  . Left foot pain 08/21/2018  . Left knee injury 05/09/2016  . 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 Morton's neuroma of left foot 01/18/2016  . Osteopenia 10/10/2016  . Overweight(278.02) 06/13/2009   Qualifier: Diagnosis of  By: Hedy Camara    . Pain in joint, shoulder region 07/05/2014   LEFT > Right Reports hx rotator cuff surgery on rigt shoulder previously (Dr Nicholes Stairs) but no notes available XRAY right shoulder  showed some proliferative changes distal rigt clavicle   . Rash and nonspecific skin eruption 10/10/2016  . Restless leg 09/29/2007   Qualifier: Diagnosis of  By: Walker Kehr MD, Patrick Jupiter    . Restless legs syndrome (RLS)   . Sciatica of left side 10/26/2014  . Seizures ([place])    last >77yrs  . SI (sacroiliac) pain 03/20/2017  . Sinusitis chronic, frontal   . Status post lumbar spinal fusion 03/20/2017  . Subacromial or subdeltoid bursitis 08/08/2009   MRI C-spine 2011([facility]) - normal MRI left shoulder 10/2010 ([facility]) - AC degenerative disease, normal rotator cuff 12/2012 -debridement, acromioplasty and distal clavicle excision of left shoulder, arthroscopy also performed an no need for rotator cuff repair - performed by [facility] 02/2013 - Nerve Conduction Studies ([facility]) - ulnar nerve compression 02/2013- taken back to OR for Ulnar nerve decompression 06/2013 Repeat MRI left shoulder - subacromial/subdeltoid bursitis, a small  intersubstance tear to the distal infraspinatus and evidence of interval resection of the distal clavicle 06/2013 - steroid injection of left biceps 09/2013 -Repeat EMG showed improvement of ulnar nerve compression 10/2013 - referred to [facility] for second opinion due to intractable pain 11/2013 - Seen by Dr. Marlou Sa at [facility]; discussed risks/benefits of surgical intervention and bursectomy, no plan for surgery at this time     . 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 Tubular adenoma of colon 01/31/2020   01/2020 screening colonoscopy ([facility]) found 2 small tubular adenomas (3 mm & 4 mm) - recommend follow up surveillance colonoscopy in 7 years.   . 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 Cancer Father        lung  . Heart disease Father   . Hypertension Mother   . Seizures Neg Hx   . Colon cancer Neg Hx   . Colon polyps Neg Hx   . Esophageal cancer Neg Hx   . Stomach cancer Neg Hx   . Rectal cancer Neg Hx     Past Surgical History:  Procedure Laterality Date  . [place]   struck by golf ball-50 yrs old  . CRANIOTOMY     metal plate  . HARDWARE REMOVAL Right 11/22/2019   Procedure: HARDWARE REMOVAL;  Surgeon: Leandrew Koyanagi, MD;  Location: [facility];  Service: Orthopedics;  Laterality: Right;  . HIP PINNING,CANNULATED Right 06/27/2019   Procedure: HIP PERCUTANEOUS PINNING;  Surgeon: Meredith Pel, MD;  Location: [facility];  Service: Orthopedics;  Laterality: Right;  . NASAL FRACTURE SURGERY  1997  . SACROILIAC JOINT FUSION Left 03/20/2017   Procedure: SACROILIAC JOINT FUSION;  Surgeon: Melina Schools, MD;  Location: [place];  Service: Orthopedics;  Laterality: Left;  90 mins  . SHOULDER ARTHROSCOPY Left    "cleaned arthritis out"  . SHOULDER SURGERY Right 2012   Rotator cuff surgery  . TONSILLECTOMY    . TOTAL HIP ARTHROPLASTY Right 11/22/2019    Procedure: CONVERSION OF PREVIOUS RIGHT HIP SURGERY TO TOTAL HIP ARTHROPLASTY ANTERIOR APPROACH, HARDWARE REMOVAL;  Surgeon: Leandrew Koyanagi, MD;  Location: [facility];  Service: Orthopedics;  Laterality: Right;  . UVULOPALATOPHARYNGOPLASTY (UPPP)/TONSILLECTOMY/SEPTOPLASTY  2001   Social History   Occupational History  . Occupation: disabled  Tobacco Use  . Smoking status: Former Smoker    Packs/day: 1.00    Years: 22.00    Pack years: 22.00  Types: Cigarettes    Quit date: 06/22/2012    Years since quitting: 8.4  . Smokeless tobacco: Current User    Types: Snuff  . Tobacco comment: per patient uses "dip" about twice a day   Vaping Use  . Vaping Use: Never used  Substance and Sexual Activity  . Alcohol use: No    Alcohol/week: 0.0 standard drinks    Comment: none since 7/11  . Drug use: No  . Sexual activity: Never

## 2020-12-14 ENCOUNTER — Other Ambulatory Visit: Payer: Self-pay

## 2020-12-14 ENCOUNTER — Ambulatory Visit: Payer: Medicare Other | Attending: Orthopaedic Surgery

## 2020-12-14 DIAGNOSIS — R269 Unspecified abnormalities of gait and mobility: Secondary | ICD-10-CM | POA: Insufficient documentation

## 2020-12-14 DIAGNOSIS — M25572 Pain in left ankle and joints of left foot: Secondary | ICD-10-CM | POA: Insufficient documentation

## 2020-12-14 DIAGNOSIS — M6281 Muscle weakness (generalized): Secondary | ICD-10-CM | POA: Insufficient documentation

## 2020-12-14 NOTE — Therapy (Addendum)
[place], [place], [address] Phone: [phone]   Fax:  [phone]  Physical Therapy Evaluation  Patient Details  Name: Richard Glass MRN: [id number] Date of Birth: 02-18-1971 Referring Provider (PT): Leandrew Koyanagi, MD   Encounter Date: 12/14/2020    Past Medical History:  Diagnosis Date  . 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  . Allergic rhinitis 11/13/2006       . Anxiety   . Anxiety state 08/08/2013  . Asthma   . ASTHMA, INTERMITTENT 07/08/2010   Qualifier: Diagnosis of  By: Walker Kehr MD, Patrick Jupiter    . Back pain of lumbar region with sciatica 11/13/2006   MRI 12/2014 - lumbar DDD without focal neural impingement  MRI Lumbar 10/30/16 ([facility]) Small central L5-S1 disc extrusion with minimal cranial migration and associated annular fissure approaches descending left S1 nerve roots without contact or displacement. Minimal bulging disc height loss without spinal canal stenosis or neural foraminal narrowing.  Minimal bulging disc at L2-L3 through L4-L5 without spinal canal stenosis or neural foraminal narrowing.  MRI Sacrum 10/30/16 ([place]) No fracture. Small lesion left sacral ala most likely a subchondral cyst/erosion adjacent to the sacroiliac joint.     . 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 Carpal tunnel syndrome 01/09/2015   Left 12/2014   . Cervical spine pain 02/06/2015   MRI 03/2015 FINDINGS: Vertebral body height, signal and alignment are normal. The craniocervical junction is normal and cervical cord signal is normal. The central spinal canal and neural foramina are widely patent at all levels. Scattered, mild degenerative change appears most notable at C4-5. Imaged paraspinous structures are unremarkable.  IMPRESSION: Negative for central canal or foraminal narrowing. No finding  to explain the patient's symptoms. Scattered, mild facet degenerative disease is noted.   . Chronic low back pain   . Chronic pain 01/18/2016  . Chronic pain syndrome 01/18/2016  . Coccyx pain 02/06/2015  . Contact dermatitis or eczema 02/13/2018  . Convulsions ([place]) 11/13/2006     Cystic encephalomalacia left temporal and parietal regions from remote injury.   . Degenerative arthritis   . Dyslipidemia   . External hemorrhoid, bleeding 06/04/2011  . GASTROESOPHAGEAL REFLUX, NO ESOPHAGITIS 11/13/2006   Qualifier: Diagnosis of  By: Eusebio [place]    . Hamstring muscle strain, left, initial encounter 06/26/2018  . Headache(784.0)   . Hyperlipidemia 11/13/2006   07/2016  ASCVD score of 4.7% - recommended lifestyle changes   . Insomnia 02/02/2013  . Intention tremor 10/31/2009   Qualifier: Diagnosis of  By: Walker Kehr MD, Patrick Jupiter    . Intercostal pain 04/25/2015  . Left foot pain 08/21/2018  . Left knee injury 05/09/2016  . 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 Morton's neuroma of left foot 01/18/2016  . Osteopenia 10/10/2016  . Overweight(278.02) 06/13/2009   Qualifier: Diagnosis of  By: Hedy Camara    . Pain in joint, shoulder region 07/05/2014   LEFT > Right Reports hx rotator cuff surgery on rigt shoulder previously (Dr Nicholes Stairs) but no notes available XRAY right shoulder showed some proliferative changes distal rigt clavicle   . Rash and nonspecific skin eruption 10/10/2016  . Restless leg 09/29/2007   Qualifier: Diagnosis of  By: Walker Kehr MD, Patrick Jupiter    . Restless legs syndrome (RLS)   . Sciatica of left side 10/26/2014  . Seizures ([place])    last >22yrs  .  SI (sacroiliac) pain 03/20/2017  . Sinusitis chronic, frontal   . Status post lumbar spinal fusion 03/20/2017  . Subacromial or subdeltoid bursitis 08/08/2009   MRI C-spine 2011([facility]) - normal MRI left shoulder 10/2010 ([facility]) - AC degenerative disease, normal rotator cuff 12/2012 -debridement, acromioplasty and distal clavicle excision of  left shoulder, arthroscopy also performed an no need for rotator cuff repair - performed by [facility] 02/2013 - Nerve Conduction Studies ([facility]) - ulnar nerve compression 02/2013- taken back to OR for Ulnar nerve decompression 06/2013 Repeat MRI left shoulder - subacromial/subdeltoid bursitis, a small intersubstance tear to the distal infraspinatus and evidence of interval resection of the distal clavicle 06/2013 - steroid injection of left biceps 09/2013 -Repeat EMG showed improvement of ulnar nerve compression 10/2013 - referred to [facility] for second opinion due to intractable pain 11/2013 - Seen by Dr[facility]ou [facility] at [facility]; discussed risks/benefits of surgical intervention and bursectomy, no plan for surgery at this time     . 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 Tubular adenoma of colon 01/31/2020   01/2020 screening colonoscopy ([facility]) found 2 small tubular adenomas (3 mm & 4 mm) - recommend follow up surveillance colonoscopy in 7 years.   . 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 [place]   struck by golf ball-50 yrs old  . CRANIOTOMY     metal plate  . HARDWARE REMOVAL Right 11/22/2019   Procedure: HARDWARE REMOVAL;  Surgeon: Leandrew Koyanagi, MD;  Location: [facility];  Service: Orthopedics;  Laterality: Right;  . HIP PINNING,CANNULATED Right 06/27/2019   Procedure: HIP PERCUTANEOUS PINNING;  Surgeon: Meredith Pel, MD;  Location: [facility];  Service: Orthopedics;  Laterality: Right;  . NASAL FRACTURE SURGERY  1997  . SACROILIAC JOINT FUSION Left 03/20/2017   Procedure: SACROILIAC JOINT FUSION;  Surgeon: Melina Schools, MD;  Location: [place];  Service: Orthopedics;  Laterality: Left;  90 mins  . SHOULDER ARTHROSCOPY Left    "cleaned arthritis out"  . SHOULDER SURGERY Right 2012   Rotator cuff surgery  . TONSILLECTOMY    . TOTAL HIP ARTHROPLASTY  Right 11/22/2019   Procedure: CONVERSION OF PREVIOUS RIGHT HIP SURGERY TO TOTAL HIP ARTHROPLASTY ANTERIOR APPROACH, HARDWARE REMOVAL;  Surgeon: Leandrew Koyanagi, MD;  Location: [facility];  Service: Orthopedics;  Laterality: Right;  . UVULOPALATOPHARYNGOPLASTY (UPPP)/TONSILLECTOMY/SEPTOPLASTY  2001    There were no vitals filed for this visit.                    Objective measurements completed on examination: See above findings.                 PT Short Term Goals - 12/14/20 1400      PT SHORT TERM GOAL #1   Title Pt will be independent with HEP in order to improve functional ability and increase carryover    Baseline Initial HEP given    Time 3    Period Weeks    Status New    Target Date 01/04/21      PT SHORT TERM GOAL #2   Title Pt will be able to ambulate 368ft with no increases in pain or LoB in order to improve safety with functional mobility    Baseline amb 140ft with unsteady gait and 6/10 L ankle pain    Time 3  Period Weeks    Status New    Target Date 01/04/21             PT Long Term Goals - 12/14/20 1403      PT LONG TERM GOAL #1   Title Pt will improve FOTO outcome measure score to no less than 70% in order to improve confidence and functional ability    Baseline 56% eval    Time 10    Period Days    Status New    Target Date 02/22/21      PT LONG TERM GOAL #2   Title Increase bilateral hip, knee and ankle strength grossly 1/2 MMT grade to improve ability for transfers from low chair, lifting for chores and for improve gait stability for outdoor ambulation over uneven surfaces    Baseline see flowsheet    Time 10    Period Weeks    Status New    Target Date 02/22/21      PT LONG TERM GOAL #3   Title Pt will improve 5xSTS time to no less than 15 seconds in order to improve safety and functional mobility    Baseline 22 seconds    Time 10    Period Weeks    Status New    Target Date 02/22/21                    Patient will benefit from skilled therapeutic intervention in order to improve the following deficits and impairments:  Abnormal gait,Decreased activity tolerance,Decreased balance,Decreased endurance,Decreased range of motion,Decreased strength,Difficulty walking,Impaired sensation,Pain,Dizziness  Visit Diagnosis: Muscle weakness (generalized) - Plan: PT plan of care cert/re-cert  Pain in left ankle and joints of left foot - Plan: PT plan of care cert/re-cert  Impaired gait - Plan: PT plan of care cert/re-cert     Problem List Patient Active Problem List   Diagnosis Date Noted  . Wedge compression fracture of third thoracic vertebra, initial encounter for closed fracture ([place]) 09/14/2020  . Status post right hip replacement 02/15/2020  . Status post total replacement of right hip 11/22/2019  . Painful orthopaedic hardware ([place]) 11/11/2019  . 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 Weight loss, non-intentional 10/14/2019  . Bilateral leg weakness 09/03/2019  . S/P right hip fracture 07/07/2019  . Right leg pain 06/26/2019  . Femoral fracture ([place]) 06/26/2019  . Hearing loss 05/01/2019  . Chronic left leg pain 11/20/2018  . Allodynia, left leg 11/20/2018  . History of traumatic brain injury 02/23/2018  . High risk medication use, Combination of Opioid and Benzodiazapine 02/12/2018  . 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 SI (sacroiliac) pain 03/20/2017  . Enchondroma of left fibular head 05/17/2016  . Chronic pain syndrome 01/18/2016  . Anxiety state 08/08/2013  . Cerumen impaction 08/06/2013  . Insomnia 02/02/2013  . Right flank pain 05/21/2011  . 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 Encounter for chronic pain management 07/04/2009  . Restless leg 09/29/2007  . Hyperlipidemia 11/13/2006  . Allergic rhinitis 11/13/2006  . GASTROESOPHAGEAL REFLUX, NO ESOPHAGITIS 11/13/2006  . Low  back pain 11/13/2006  . Convulsions ([place]) 11/13/2006  . Seizure ([place]) 11/13/2006    Ward Chatters, PT, DPT 01/04/21 1:44 PM  [facility] [address] [place], [place], [address] Phone: [phone]   Fax:  [phone]  Name: TALLIN HART MRN: [id number] Date of Birth: [birth year]

## 2020-12-26 DIAGNOSIS — Z96619 Presence of unspecified artificial shoulder joint: Secondary | ICD-10-CM | POA: Diagnosis not present

## 2020-12-26 DIAGNOSIS — F17200 Nicotine dependence, unspecified, uncomplicated: Secondary | ICD-10-CM | POA: Diagnosis not present

## 2020-12-26 DIAGNOSIS — Z79899 Other long term (current) drug therapy: Secondary | ICD-10-CM | POA: Diagnosis not present

## 2020-12-26 DIAGNOSIS — G8929 Other chronic pain: Secondary | ICD-10-CM | POA: Diagnosis not present

## 2020-12-26 DIAGNOSIS — M25519 Pain in unspecified shoulder: Secondary | ICD-10-CM | POA: Diagnosis not present

## 2020-12-26 DIAGNOSIS — Z6821 Body mass index (BMI) 21.0-21.9, adult: Secondary | ICD-10-CM | POA: Diagnosis not present

## 2020-12-26 DIAGNOSIS — M25551 Pain in right hip: Secondary | ICD-10-CM | POA: Diagnosis not present

## 2021-01-05 ENCOUNTER — Ambulatory Visit: Payer: Medicare Other | Admitting: Rehabilitative and Restorative Service Providers"

## 2021-01-11 ENCOUNTER — Ambulatory Visit: Payer: Medicare Other

## 2021-01-24 DIAGNOSIS — Z79899 Other long term (current) drug therapy: Secondary | ICD-10-CM | POA: Diagnosis not present

## 2021-01-24 DIAGNOSIS — Z6821 Body mass index (BMI) 21.0-21.9, adult: Secondary | ICD-10-CM | POA: Diagnosis not present

## 2021-01-24 DIAGNOSIS — M25551 Pain in right hip: Secondary | ICD-10-CM | POA: Diagnosis not present

## 2021-01-24 DIAGNOSIS — M25519 Pain in unspecified shoulder: Secondary | ICD-10-CM | POA: Diagnosis not present

## 2021-01-24 DIAGNOSIS — G8929 Other chronic pain: Secondary | ICD-10-CM | POA: Diagnosis not present

## 2021-01-24 DIAGNOSIS — F17200 Nicotine dependence, unspecified, uncomplicated: Secondary | ICD-10-CM | POA: Diagnosis not present

## 2021-01-24 DIAGNOSIS — S82892D Other fracture of left lower leg, subsequent encounter for closed fracture with routine healing: Secondary | ICD-10-CM | POA: Diagnosis not present

## 2021-02-03 ENCOUNTER — Other Ambulatory Visit: Payer: Self-pay | Admitting: Neurology

## 2021-02-06 ENCOUNTER — Ambulatory Visit: Payer: Medicare Other | Admitting: Orthopaedic Surgery

## 2021-02-08 ENCOUNTER — Encounter: Payer: Self-pay | Admitting: Orthopaedic Surgery

## 2021-02-08 ENCOUNTER — Other Ambulatory Visit: Payer: Self-pay

## 2021-02-08 ENCOUNTER — Ambulatory Visit (INDEPENDENT_AMBULATORY_CARE_PROVIDER_SITE_OTHER): Payer: Medicare Other | Admitting: Orthopaedic Surgery

## 2021-02-08 VITALS — Ht 68.0 in | Wt 141.0 lb

## 2021-02-08 DIAGNOSIS — Z96641 Presence of right artificial hip joint: Secondary | ICD-10-CM | POA: Diagnosis not present

## 2021-02-08 DIAGNOSIS — M25531 Pain in right wrist: Secondary | ICD-10-CM | POA: Diagnosis not present
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
## 2021-02-13 ENCOUNTER — Telehealth: Payer: Self-pay

## 2021-02-13 NOTE — Telephone Encounter (Signed)
Pt returned call regarding referral for Dr. Ernestina Patches

## 2021-02-14 NOTE — Telephone Encounter (Signed)
Called pt and sch 7/22

## 2021-02-23 DIAGNOSIS — Z79899 Other long term (current) drug therapy: Secondary | ICD-10-CM | POA: Diagnosis not present

## 2021-02-23 DIAGNOSIS — Z96619 Presence of unspecified artificial shoulder joint: Secondary | ICD-10-CM | POA: Diagnosis not present

## 2021-02-23 DIAGNOSIS — Z6822 Body mass index (BMI) 22.0-22.9, adult: Secondary | ICD-10-CM | POA: Diagnosis not present

## 2021-02-23 DIAGNOSIS — M25551 Pain in right hip: Secondary | ICD-10-CM | POA: Diagnosis not present

## 2021-02-23 DIAGNOSIS — R030 Elevated blood-pressure reading, without diagnosis of hypertension: Secondary | ICD-10-CM | POA: Diagnosis not present

## 2021-02-23 DIAGNOSIS — M25519 Pain in unspecified shoulder: Secondary | ICD-10-CM | POA: Diagnosis not present

## 2021-02-23 DIAGNOSIS — G8929 Other chronic pain: Secondary | ICD-10-CM | POA: Diagnosis not present

## 2021-02-23 DIAGNOSIS — F17200 Nicotine dependence, unspecified, uncomplicated: Secondary | ICD-10-CM | POA: Diagnosis not present

## 2021-02-27 ENCOUNTER — Ambulatory Visit (INDEPENDENT_AMBULATORY_CARE_PROVIDER_SITE_OTHER): Payer: Medicare Other | Admitting: Neurology

## 2021-02-27 ENCOUNTER — Encounter: Payer: Self-pay | Admitting: Neurology

## 2021-02-27 VITALS — BP 122/77 | HR 58 | Ht 68.0 in | Wt 150.0 lb

## 2021-02-27 DIAGNOSIS — M25511 Pain in right shoulder: Secondary | ICD-10-CM | POA: Diagnosis not present

## 2021-02-27 DIAGNOSIS — Z87820 Personal history of traumatic brain injury: Secondary | ICD-10-CM

## 2021-02-27 DIAGNOSIS — M62830 Muscle spasm of back: Secondary | ICD-10-CM | POA: Diagnosis not present

## 2021-02-27 DIAGNOSIS — R569 Unspecified convulsions: Secondary | ICD-10-CM

## 2021-02-27 DIAGNOSIS — M25519 Pain in unspecified shoulder: Secondary | ICD-10-CM | POA: Diagnosis not present

## 2021-02-27 DIAGNOSIS — M546 Pain in thoracic spine: Secondary | ICD-10-CM | POA: Diagnosis not present

## 2021-02-27 DIAGNOSIS — M542 Cervicalgia: Secondary | ICD-10-CM | POA: Diagnosis not present

## 2021-02-27 MED ORDER — ZONISAMIDE 100 MG PO CAPS: 100.0000 mg | ORAL_CAPSULE | Freq: Two times a day (BID) | ORAL | 3 refills | Status: DC
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
## 2021-02-27 NOTE — Progress Notes (Signed)
I have read the note, and I agree with the clinical assessment and plan.  Mykelle Cockerell K Sebastian Lurz   

## 2021-02-27 NOTE — Patient Instructions (Signed)
Continue current medications Don't miss any doses Make sure to get nerve conduction  Call for any seizures See you back in 6 months

## 2021-03-26 DIAGNOSIS — M25519 Pain in unspecified shoulder: Secondary | ICD-10-CM | POA: Diagnosis not present

## 2021-03-26 DIAGNOSIS — M25512 Pain in left shoulder: Secondary | ICD-10-CM | POA: Diagnosis not present

## 2021-03-26 DIAGNOSIS — M25511 Pain in right shoulder: Secondary | ICD-10-CM | POA: Diagnosis not present

## 2021-03-26 DIAGNOSIS — M25551 Pain in right hip: Secondary | ICD-10-CM | POA: Diagnosis not present

## 2021-03-26 DIAGNOSIS — Z96641 Presence of right artificial hip joint: Secondary | ICD-10-CM | POA: Diagnosis not present

## 2021-03-26 DIAGNOSIS — Z79899 Other long term (current) drug therapy: Secondary | ICD-10-CM | POA: Diagnosis not present

## 2021-03-26 DIAGNOSIS — M542 Cervicalgia: Secondary | ICD-10-CM | POA: Diagnosis not present

## 2021-03-26 DIAGNOSIS — F17200 Nicotine dependence, unspecified, uncomplicated: Secondary | ICD-10-CM | POA: Diagnosis not present

## 2021-03-26 DIAGNOSIS — M5416 Radiculopathy, lumbar region: Secondary | ICD-10-CM | POA: Diagnosis not present

## 2021-03-26 DIAGNOSIS — G8929 Other chronic pain: Secondary | ICD-10-CM | POA: Diagnosis not present

## 2021-03-26 DIAGNOSIS — Z6822 Body mass index (BMI) 22.0-22.9, adult: Secondary | ICD-10-CM | POA: Diagnosis not present

## 2021-03-26 DIAGNOSIS — R030 Elevated blood-pressure reading, without diagnosis of hypertension: Secondary | ICD-10-CM | POA: Diagnosis not present

## 2021-03-29 DIAGNOSIS — M62830 Muscle spasm of back: Secondary | ICD-10-CM | POA: Diagnosis not present

## 2021-03-29 DIAGNOSIS — M542 Cervicalgia: Secondary | ICD-10-CM | POA: Diagnosis not present

## 2021-03-29 DIAGNOSIS — M25519 Pain in unspecified shoulder: Secondary | ICD-10-CM | POA: Diagnosis not present

## 2021-03-29 DIAGNOSIS — M546 Pain in thoracic spine: Secondary | ICD-10-CM | POA: Diagnosis not present

## 2021-03-29 DIAGNOSIS — M25511 Pain in right shoulder: Secondary | ICD-10-CM | POA: Diagnosis not present

## 2021-04-06 ENCOUNTER — Other Ambulatory Visit: Payer: Self-pay

## 2021-04-06 ENCOUNTER — Ambulatory Visit (INDEPENDENT_AMBULATORY_CARE_PROVIDER_SITE_OTHER): Payer: Medicare Other | Admitting: Physical Medicine and Rehabilitation

## 2021-04-06 ENCOUNTER — Encounter: Payer: Self-pay | Admitting: Physical Medicine and Rehabilitation

## 2021-04-06 DIAGNOSIS — R202 Paresthesia of skin: Secondary | ICD-10-CM

## 2021-04-06 NOTE — Progress Notes (Signed)
Pt state he has pain, numbness and tingling in his right hand and arm. Pt state he has to sleep with a TENS unit at night because if he doesn't hes not able to close his hand and the pain will run up his arm and shoulder. Pt state he has trouble holding his 37oz coffee cup. Pt current has his TENS Unit on in the offices. Pt state he is right handed.  Numeric Pain Rating Scale and Functional Assessment Average Pain 7   In the last MONTH (on 0-10 scale) has pain interfered with the following?  1. General activity like being  able to carry out your everyday physical activities such as walking, climbing stairs, carrying groceries, or moving a chair?  Rating(10)

## 2021-04-09 NOTE — Progress Notes (Signed)
Richard Glass - 50 y.o. male MRN [id number]  Date of birth: 09-30-1970  Office Visit Note: Visit Date: 04/06/2021 PCP: Riki Sheer, NP Referred by: Riki Sheer, NP  Subjective: Chief Complaint  Patient presents with   Right Hand - Pain, Weakness, Tingling   Right Shoulder - Pain   Right Arm - Pain, Numbness, Tingling   HPI:  Richard Glass is a 50 y.o. male who comes in today at the request of Dr. Eduard Roux for electrodiagnostic study of the Right upper extremities.  Patient is Right hand dominant.  He complains of 7 out of 10 pain on average with numbness and tingling in the right hand and arm in a nondermatomal distribution.  He reports that he has to sleep with a TENS unit on at night because he does not move his hand very well if he does not use the TENS unit.  He says he will be unable to close his hand and the pain will start to run up his arm into his shoulder.  He denies frank radicular symptoms down the arm.  No left-sided complaints.  He does report weakness with inability to hold a large coffee cup.  His history is very complicated with prior traumatic brain injury followed by Dr. Floyde Parkins at [facility].  He has had prior electrodiagnostic studies over the last couple of years by Dr. Jannifer Franklin and those are reviewed below.  Those were essentially unrevealing and fairly normal.  He has also been treated by Dr. Kary Kos for a thoracic wedge compression fracture.  In December 2021 he had CT scan of the cervical spine shown a acute minimally displaced C7 transverse process fracture.  This would not cause any nerve issues.   Glass Otherwise per HPI.  Assessment & Plan: Visit Diagnoses:    ICD-10-CM   1. Paresthesia of skin  R20.2 NCV with EMG (electromyography)      Plan: Impression: Essentially NORMAL electrodiagnostic study of the right upper limb.  There is no significant electrodiagnostic evidence of nerve entrapment, brachial plexopathy or  cervical radiculopathy.  Prior electrodiagnostic study of the right upper limb in January 2021 by Dr. Floyde Parkins was also unrevealing.  As you know, purely sensory or demyelinating radiculopathies and chemical radiculitis may not be detected with this particular electrodiagnostic study.  Recommendations: 1.  Follow-up with referring physician. 2.  Continue current management of symptoms.  Meds & Orders: No orders of the defined types were placed in this encounter.   Orders Placed This Encounter  Procedures   NCV with EMG (electromyography)    Follow-up: Return in about 2 weeks (around 04/20/2021) for Eduard Roux, MD.   Procedures: No procedures performed  EMG & NCV Findings: All examined nerves (as indicated in the following tables) were within normal limits.    All examined muscles (as indicated in the following table) showed no evidence of electrical instability.    Impression: Essentially NORMAL electrodiagnostic study of the right upper limb.  There is no significant electrodiagnostic evidence of nerve entrapment, brachial plexopathy or cervical radiculopathy.  Prior electrodiagnostic study of the right upper limb in January 2021 by Dr. Floyde Parkins was also unrevealing.  As you know, purely sensory or demyelinating radiculopathies and chemical radiculitis may not be detected with this particular electrodiagnostic study.  Recommendations: 1.  Follow-up with referring physician. 2.  Continue current management of symptoms.  ___________________________ Wonda Olds Board Certified, American Board of Physical Medicine and Rehabilitation  Nerve Conduction Studies Anti Sensory Summary Table   Stim Site NR Peak (ms) Norm Peak (ms) P-T Amp (V) Norm P-T Amp Site1 Site2 Delta-P (ms) Dist (cm) Vel (m/s) Norm Vel (m/s)  Right Median Acr Palm Anti Sensory (2nd Digit)  30.2C  Wrist    3.6 <3.6 20.4 >10 Wrist Palm 1.7 0.0    Palm    1.9 <2.0 21.6         Right Radial Anti  Sensory (Base 1st Digit)  30.4C  Wrist    2.2 <3.1 26.6  Wrist Base 1st Digit 2.2 0.0    Right Ulnar Anti Sensory (5th Digit)  30.5C  Wrist    3.7 <3.7 17.3 >15.0 Wrist 5th Digit 3.7 14.0 38 >38   Motor Summary Table   Stim Site NR Onset (ms) Norm Onset (ms) O-P Amp (mV) Norm O-P Amp Site1 Site2 Delta-0 (ms) Dist (cm) Vel (m/s) Norm Vel (m/s)  Right Median Motor (Abd Poll Brev)  30.5C  Wrist    3.6 <4.2 7.7 >5 Elbow Wrist 4.6 22.0 >50 >50  Elbow    8.2  7.3         Right Ulnar Motor (Abd Dig Min)  30.7C  Wrist    3.4 <4.2 5.8 >3 B Elbow Wrist 3.9 21.0 54 >53  B Elbow    7.3  4.5  A Elbow B Elbow 1.6 10.0 62 >53  A Elbow    8.9  5.4          EMG   Side Muscle Nerve Root Ins Act Fibs Psw Amp Dur Poly Recrt Int Fraser Din Comment  Right Abd Poll Brev Median C8-T1 Nml Nml Nml Nml Nml 0 Nml Nml   Right 1stDorInt Ulnar C8-T1 Nml Nml Nml Nml Nml 0 Nml Nml   Right PronatorTeres Median C6-7 Nml Nml Nml Nml Nml 0 Nml Nml   Right Biceps Musculocut C5-6 Nml Nml Nml Nml Nml 0 Nml Nml   Right Deltoid Axillary C5-6 Nml Nml Nml Nml Nml 0 Nml Nml     Nerve Conduction Studies Anti Sensory Left/Right Comparison   Stim Site L Lat (ms) R Lat (ms) L-R Lat (ms) L Amp (V) R Amp (V) L-R Amp (%) Site1 Site2 L Vel (m/s) R Vel (m/s) L-R Vel (m/s)  Median Acr Palm Anti Sensory (2nd Digit)  30.2C  Wrist  3.6   20.4  Wrist Palm     Palm  1.9   21.6        Radial Anti Sensory (Base 1st Digit)  30.4C  Wrist  2.2   26.6  Wrist Base 1st Digit     Ulnar Anti Sensory (5th Digit)  30.5C  Wrist  3.7   17.3  Wrist 5th Digit  38    Motor Left/Right Comparison   Stim Site L Lat (ms) R Lat (ms) L-R Lat (ms) L Amp (mV) R Amp (mV) L-R Amp (%) Site1 Site2 L Vel (m/s) R Vel (m/s) L-R Vel (m/s)  Median Motor (Abd Poll Brev)  30.5C  Wrist  3.6   7.7  Elbow Wrist  52   Elbow  8.2   7.3        Ulnar Motor (Abd Dig Min)  30.7C  Wrist  3.4   5.8  B Elbow Wrist  54   B Elbow  7.3   4.5  A Elbow B Elbow  62   A Elbow   8.9   5.4  Clinical History: 10/05/2019 electrodiagnostic study of the right upper and lower limb IMPRESSION:   Nerve conduction studies done on the right upper extremity shows some distal dysfunction of the right ulnar nerve. Nerve conduction studies of the lower extremities were relatively unremarkable with exception of slight slowing seen for the right peroneal nerve. There is no evidence of a generalized peripheral neuropathy. EMG evaluation of the right upper and right lower extremities were unremarkable, there is no evidence of an overlying cervical or lumbosacral radiculopathy on this side.   Jill Alexanders MD 10/05/2019 3:28 PM ----  IMPRESSION:   Nerve conduction studies done on both lower extremities shows slight slowing for the peroneal nerves bilaterally with sensory sparing, no clear evidence of a peripheral neuropathy is seen. EMG evaluation of the left lower extremity is relatively unremarkable without evidence of an overlying lumbosacral radiculopathy.    Jill Alexanders MD 11/03/2018 9:39 AM     Objective:  VS:  HT:    WT:   BMI:     BP:   HR: bpm  TEMP: ( )  RESP:  Physical Exam Musculoskeletal:        General: No tenderness.     Comments: Inspection reveals no atrophy of the bilateral APB or FDI or hand intrinsics. There is no swelling, color changes, allodynia or dystrophic changes. There is decreased effort Duda pain but 5 out of 5 strength in the bilateral wrist extension, finger abduction and long finger flexion. There is intact sensation to light touch in all dermatomal and peripheral nerve distributions.  There is a negative Hoffmann's test bilaterally.  Skin:    General: Skin is warm and dry.     Findings: No erythema or rash.  Neurological:     General: No focal deficit present.     Mental Status: He is alert and oriented to person, place, and time.     Sensory: No sensory deficit.     Motor: No weakness or abnormal muscle tone.      Coordination: Coordination normal.     Gait: Gait normal.  Psychiatric:        Mood and Affect: Mood normal.        Behavior: Behavior normal.        Thought Content: Thought content normal.     Imaging: No results found.

## 2021-04-09 NOTE — Procedures (Signed)
EMG & NCV Findings: All examined nerves (as indicated in the following tables) were within normal limits.    All examined muscles (as indicated in the following table) showed no evidence of electrical instability.    Impression: Essentially NORMAL electrodiagnostic study of the right upper limb.  There is no significant electrodiagnostic evidence of nerve entrapment, brachial plexopathy or cervical radiculopathy.  Prior electrodiagnostic study of the right upper limb in January 2021 by Dr. Floyde Parkins was also unrevealing.  As you know, purely sensory or demyelinating radiculopathies and chemical radiculitis may not be detected with this particular electrodiagnostic study.  Recommendations: 1.  Follow-up with referring physician. 2.  Continue current management of symptoms.  ___________________________ Laurence Spates FAAPMR Board Certified, American Board of Physical Medicine and Rehabilitation    Nerve Conduction Studies Anti Sensory Summary Table   Stim Site NR Peak (ms) Norm Peak (ms) P-T Amp (V) Norm P-T Amp Site1 Site2 Delta-P (ms) Dist (cm) Vel (m/s) Norm Vel (m/s)  Right Median Acr Palm Anti Sensory (2nd Digit)  30.2C  Wrist    3.6 <3.6 20.4 >10 Wrist Palm 1.7 0.0    Palm    1.9 <2.0 21.6         Right Radial Anti Sensory (Base 1st Digit)  30.4C  Wrist    2.2 <3.1 26.6  Wrist Base 1st Digit 2.2 0.0    Right Ulnar Anti Sensory (5th Digit)  30.5C  Wrist    3.7 <3.7 17.3 >15.0 Wrist 5th Digit 3.7 14.0 38 >38   Motor Summary Table   Stim Site NR Onset (ms) Norm Onset (ms) O-P Amp (mV) Norm O-P Amp Site1 Site2 Delta-0 (ms) Dist (cm) Vel (m/s) Norm Vel (m/s)  Right Median Motor (Abd Poll Brev)  30.5C  Wrist    3.6 <4.2 7.7 >5 Elbow Wrist 4.6 22.0 >50 >50  Elbow    8.2  7.3         Right Ulnar Motor (Abd Dig Min)  30.7C  Wrist    3.4 <4.2 5.8 >3 B Elbow Wrist 3.9 21.0 54 >53  B Elbow    7.3  4.5  A Elbow B Elbow 1.6 10.0 62 >53  A Elbow    8.9  5.4          EMG   Side  Muscle Nerve Root Ins Act Fibs Psw Amp Dur Poly Recrt Int Fraser Din Comment  Right Abd Poll Brev Median C8-T1 Nml Nml Nml Nml Nml 0 Nml Nml   Right 1stDorInt Ulnar C8-T1 Nml Nml Nml Nml Nml 0 Nml Nml   Right PronatorTeres Median C6-7 Nml Nml Nml Nml Nml 0 Nml Nml   Right Biceps Musculocut C5-6 Nml Nml Nml Nml Nml 0 Nml Nml   Right Deltoid Axillary C5-6 Nml Nml Nml Nml Nml 0 Nml Nml     Nerve Conduction Studies Anti Sensory Left/Right Comparison   Stim Site L Lat (ms) R Lat (ms) L-R Lat (ms) L Amp (V) R Amp (V) L-R Amp (%) Site1 Site2 L Vel (m/s) R Vel (m/s) L-R Vel (m/s)  Median Acr Palm Anti Sensory (2nd Digit)  30.2C  Wrist  3.6   20.4  Wrist Palm     Palm  1.9   21.6        Radial Anti Sensory (Base 1st Digit)  30.4C  Wrist  2.2   26.6  Wrist Base 1st Digit     Ulnar Anti Sensory (5th Digit)  30.5C  Wrist  3.7  17.3  Wrist 5th Digit  38    Motor Left/Right Comparison   Stim Site L Lat (ms) R Lat (ms) L-R Lat (ms) L Amp (mV) R Amp (mV) L-R Amp (%) Site1 Site2 L Vel (m/s) R Vel (m/s) L-R Vel (m/s)  Median Motor (Abd Poll Brev)  30.5C  Wrist  3.6   7.7  Elbow Wrist  52   Elbow  8.2   7.3        Ulnar Motor (Abd Dig Min)  30.7C  Wrist  3.4   5.8  B Elbow Wrist  54   B Elbow  7.3   4.5  A Elbow B Elbow  62   A Elbow  8.9   5.4

## 2021-04-18 ENCOUNTER — Other Ambulatory Visit: Payer: Self-pay | Admitting: Neurology

## 2021-04-20 ENCOUNTER — Ambulatory Visit (INDEPENDENT_AMBULATORY_CARE_PROVIDER_SITE_OTHER): Payer: Medicare Other | Admitting: Orthopaedic Surgery

## 2021-04-20 ENCOUNTER — Other Ambulatory Visit: Payer: Self-pay

## 2021-04-20 ENCOUNTER — Encounter: Payer: Self-pay | Admitting: Orthopaedic Surgery

## 2021-04-20 DIAGNOSIS — M25531 Pain in right wrist: Secondary | ICD-10-CM

## 2021-04-20 DIAGNOSIS — M542 Cervicalgia: Secondary | ICD-10-CM

## 2021-04-20 NOTE — Progress Notes (Signed)
Office Visit Note   Patient: Richard Glass           Date of Birth: 12/13/1970           MRN: [id number] Visit Date: 04/20/2021              Requested by: Riki Sheer, NP [place],  [place] [id number] PCP: Riki Sheer, NP   Assessment & Plan: Visit Diagnoses:  1. Pain in right wrist   2. Neck pain     Plan: Nerve conduction studies were essentially normal for compressive neuropathy.  He continues to have numbness and burning pain and tingling down his entire right arm therefore we will need to obtain a cervical spine MRI to further evaluate for suspected radiculopathy.  Follow-up after the MRI.  Follow-Up Instructions: No follow-ups on file.   Orders:  Orders Placed This Encounter  Procedures   MR Cervical Spine w/o contrast   No orders of the defined types were placed in this encounter.     Procedures: No procedures performed   Clinical Data: No additional findings.   Subjective: Chief Complaint  Patient presents with   Right Hand - Pain   Left Hand - Pain    HPI Anival returns today to review nerve conduction studies that was recently done with Dr. Ernestina Patches. Review of Systems   Objective: Vital Signs: There were no vitals taken for this visit.  Physical Exam  Ortho Exam Examination of the right arm is unchanged. Specialty Comments:  No specialty comments available.  Imaging: No results found.   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   Weight loss, non-intentional 10/14/2019   Bilateral leg weakness 09/03/2019   S/P right hip fracture 07/07/2019   Right leg  pain 06/26/2019   Femoral fracture ([place]) 06/26/2019   Hearing loss 05/01/2019   Chronic left leg pain 11/20/2018   Allodynia, left leg 11/20/2018   History of traumatic brain injury 02/23/2018   High risk medication use, Combination of Opioid and Benzodiazapine 02/12/2018   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   Encephalomalacia on imaging study 05/01/2017   SI (sacroiliac) pain 03/20/2017   Enchondroma of left fibular head 05/17/2016   Chronic pain syndrome 01/18/2016   Anxiety state 08/08/2013   Cerumen impaction 08/06/2013   Insomnia 02/02/2013   Right flank pain 05/21/2011   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   Allergic rhinitis 11/13/2006        Anxiety    Anxiety state 08/08/2013   Asthma    ASTHMA, INTERMITTENT 07/08/2010   Qualifier: Diagnosis of  By: Walker Kehr MD, Wayne     Back pain of lumbar region with sciatica 11/13/2006   MRI 12/2014 - lumbar DDD without focal neural impingement  MRI Lumbar 10/30/16 ([place]) Small central L5-S1 disc extrusion with minimal cranial migration and associated annular fissure approaches descending left S1 nerve roots without contact or displacement. Minimal bulging disc height loss without spinal canal stenosis or neural foraminal narrowing.  Minimal bulging disc at L2-L3 through L4-L5 without spinal canal stenosis or neural foraminal narrowing.  MRI Sacrum 10/30/16 ([place]) No fracture. Small lesion left sacral ala most likely a subchondral cyst/erosion adjacent to the sacroiliac joint.      Bone tumor 05/17/2016    Left Proximal Fibula (MRI September 2017)   Carpal tunnel syndrome 01/09/2015   Left 12/2014    Cervical spine pain 02/06/2015   MRI 03/2015 FINDINGS: Vertebral body height, signal and alignment are normal. The craniocervical junction is normal and cervical cord signal is normal. The central spinal canal and neural foramina are widely patent at all levels. Scattered, mild degenerative change appears most notable at C4-5. Imaged paraspinous structures are unremarkable.   IMPRESSION: Negative for central canal or foraminal narrowing. No finding to explain the patient's symptoms. Scattered, mild facet degenerative disease is noted.    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 [id number]   Morton's neuroma of left foot 01/18/2016   Osteopenia 10/10/2016   Overweight(278.02) 06/13/2009   Qualifier: Diagnosis of  By: Hedy Camara     Pain in joint, shoulder region 07/05/2014   LEFT > Right Reports hx rotator cuff surgery on rigt shoulder previously (Dr Nicholes Stairs) but no notes available XRAY right shoulder showed some proliferative changes distal rigt clavicle    Rash and nonspecific skin eruption 10/10/2016   Restless leg  09/29/2007   Qualifier: Diagnosis of  By: Walker Kehr MD, Wayne     Restless legs syndrome (RLS)    Sciatica of left side 10/26/2014   Seizures ([facility])    last >14yr   SI (sacroiliac) pain 03/20/2017   Sinusitis chronic, frontal    Status post lumbar spinal fusion 03/20/2017   Subacromial or subdeltoid bursitis 08/08/2009   MRI C-spine 2011([facility]) - normal MRI left shoulder 10/2010 ([facility]) - AC degenerative disease, normal rotator cuff 12/2012 -debridement, acromioplasty and distal clavicle excision of left shoulder, arthroscopy also performed an no need for rotator cuff repair - performed by [facility] 02/2013 - Nerve Conduction Studies ([facility]) - ulnar nerve compression 02/2013- taken back to OR for Ulnar nerve decompression 06/2013 Repeat MRI left shoulder - subacromial/subdeltoid bursitis, a small intersubstance tear to the distal infraspinatus and evidence of interval resection of the distal clavicle 06/2013 - steroid injection of left biceps 09/2013 -Repeat EMG showed improvement of ulnar nerve compression 10/2013 [facility] to intractable pain 11/2013 - Se[facility] by [facility] 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) found 2 small tubular adenomas (3 mm & 4 mm) - recommend follow up surveillance colonoscopy in 7 years.    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   Presumed surgery June 2014     Family History  Problem Relation Age of Onset   Cancer Father        lung   Heart disease Father    Hypertension Mother    Seizures Neg Hx    Colon cancer Neg Hx    Colon polyps Neg Hx    Esophageal cancer Neg Hx    Stomach cancer Neg Hx    Rectal cancer Neg Hx     Past Surgical History:  Procedure Laterality  Date   [place]   struck by golf ball-50 yrs old   CRANIOTOMY     metal plate   HARDWARE REMOVAL Right 11/22/2019   Procedure: HARDWARE REMOVAL;  Surgeon: Leandrew Koyanagi, MD;  Location: [facility];  Service: Orthopedics;  Laterality: Right;   HIP PINNING,CANNULATED Right 06/27/2019   Procedure: HIP PERCUTANEOUS PINNING;  Surgeon: Meredith Pel, MD;  Location: [facility];  Service: Orthopedics;  Laterality: Right;   NASAL FRACTURE SURGERY  1997   SACROILIAC JOINT FUSION Left 03/20/2017   Procedure: SACROILIAC JOINT FUSION;  Surgeon: Melina Schools, MD;  Location: [facility];  Service: Orthopedics;  Laterality: Left;  90 mins   SHOULDER ARTHROSCOPY Left    "cleaned arthritis out"   SHOULDER SURGERY Right 2012   Rotator cuff surgery   TONSILLECTOMY     TOTAL HIP ARTHROPLASTY Right 11/22/2019   Procedure: CONVERSION OF PREVIOUS RIGHT HIP SURGERY TO TOTAL HIP ARTHROPLASTY ANTERIOR APPROACH, HARDWARE REMOVAL;  Surgeon: Leandrew Koyanagi, MD;  Location: [facility];  Service: Orthopedics;  Laterality: Right;   UVULOPALATOPHARYNGOPLASTY (UPPP)/TONSILLECTOMY/SEPTOPLASTY  2001   Social History   Occupational History   Occupation: disabled  Tobacco Use   Smoking status: Former    Packs/day: 1.00    Years: 22.00    Pack years: 22.00    Types: Cigarettes    Quit date: 06/22/2012    Years since quitting: 8.8   Smokeless tobacco: Current    Types: Snuff   Tobacco comments:    per patient uses "dip" about twice a day   Vaping Use   Vaping Use: Never used  Substance and Sexual Activity   Alcohol use: No    Alcohol/week: 0.0 standard drinks    Comment: none since 7/11   Drug use: No   Sexual activity: Never

## 2021-04-21 ENCOUNTER — Other Ambulatory Visit: Payer: Self-pay | Admitting: Neurology

## 2021-04-29 DIAGNOSIS — M542 Cervicalgia: Secondary | ICD-10-CM | POA: Diagnosis not present

## 2021-04-29 DIAGNOSIS — M62830 Muscle spasm of back: Secondary | ICD-10-CM | POA: Diagnosis not present

## 2021-04-29 DIAGNOSIS — M546 Pain in thoracic spine: Secondary | ICD-10-CM | POA: Diagnosis not present

## 2021-04-29 DIAGNOSIS — M25511 Pain in right shoulder: Secondary | ICD-10-CM | POA: Diagnosis not present

## 2021-04-29 DIAGNOSIS — M25519 Pain in unspecified shoulder: Secondary | ICD-10-CM | POA: Diagnosis not present

## 2021-04-30 DIAGNOSIS — F17200 Nicotine dependence, unspecified, uncomplicated: Secondary | ICD-10-CM | POA: Diagnosis not present

## 2021-04-30 DIAGNOSIS — R030 Elevated blood-pressure reading, without diagnosis of hypertension: Secondary | ICD-10-CM | POA: Diagnosis not present

## 2021-04-30 DIAGNOSIS — Z96619 Presence of unspecified artificial shoulder joint: Secondary | ICD-10-CM | POA: Diagnosis not present

## 2021-04-30 DIAGNOSIS — M25551 Pain in right hip: Secondary | ICD-10-CM | POA: Diagnosis not present

## 2021-04-30 DIAGNOSIS — Z6822 Body mass index (BMI) 22.0-22.9, adult: Secondary | ICD-10-CM | POA: Diagnosis not present

## 2021-04-30 DIAGNOSIS — M25519 Pain in unspecified shoulder: Secondary | ICD-10-CM | POA: Diagnosis not present

## 2021-04-30 DIAGNOSIS — G8929 Other chronic pain: Secondary | ICD-10-CM | POA: Diagnosis not present

## 2021-04-30 DIAGNOSIS — Z79899 Other long term (current) drug therapy: Secondary | ICD-10-CM | POA: Diagnosis not present

## 2021-05-01 ENCOUNTER — Emergency Department (HOSPITAL_COMMUNITY)
Admission: EM | Admit: 2021-05-01 | Discharge: 2021-05-01 | Disposition: A | Discharge status: Home / Self Care | Payer: Medicare Other | Attending: Emergency Medicine | Admitting: Emergency Medicine

## 2021-05-01 ENCOUNTER — Emergency Department (HOSPITAL_COMMUNITY): Payer: Medicare Other

## 2021-05-01 ENCOUNTER — Encounter (HOSPITAL_COMMUNITY): Payer: Self-pay

## 2021-05-01 ENCOUNTER — Other Ambulatory Visit: Payer: Self-pay

## 2021-05-01 DIAGNOSIS — Z9189 Other specified personal risk factors, not elsewhere classified: Secondary | ICD-10-CM

## 2021-05-01 DIAGNOSIS — R0689 Other abnormalities of breathing: Secondary | ICD-10-CM | POA: Diagnosis not present

## 2021-05-01 DIAGNOSIS — J4520 Mild intermittent asthma, uncomplicated: Secondary | ICD-10-CM | POA: Insufficient documentation

## 2021-05-01 DIAGNOSIS — E876 Hypokalemia: Secondary | ICD-10-CM | POA: Diagnosis not present

## 2021-05-01 DIAGNOSIS — Z96641 Presence of right artificial hip joint: Secondary | ICD-10-CM | POA: Insufficient documentation

## 2021-05-01 DIAGNOSIS — M940 Chondrocostal junction syndrome [Tietze]: Secondary | ICD-10-CM | POA: Insufficient documentation

## 2021-05-01 DIAGNOSIS — Z87891 Personal history of nicotine dependence: Secondary | ICD-10-CM | POA: Insufficient documentation

## 2021-05-01 DIAGNOSIS — R079 Chest pain, unspecified: Secondary | ICD-10-CM

## 2021-05-01 DIAGNOSIS — I10 Essential (primary) hypertension: Secondary | ICD-10-CM | POA: Diagnosis not present

## 2021-05-01 DIAGNOSIS — R0789 Other chest pain: Secondary | ICD-10-CM | POA: Diagnosis not present

## 2021-05-01 DIAGNOSIS — Z20822 Contact with and (suspected) exposure to covid-19: Secondary | ICD-10-CM | POA: Insufficient documentation

## 2021-05-01 DIAGNOSIS — R000 Tachycardia, unspecified: Secondary | ICD-10-CM | POA: Diagnosis not present

## 2021-05-01 DIAGNOSIS — R251 Tremor, unspecified: Secondary | ICD-10-CM | POA: Diagnosis not present

## 2021-05-01 DIAGNOSIS — R0602 Shortness of breath: Secondary | ICD-10-CM | POA: Diagnosis present

## 2021-05-01 LAB — COMPREHENSIVE METABOLIC PANEL
ALT: 15 U/L (ref 0–44)
AST: 18 U/L (ref 15–41)
Albumin: 4.1 g/dL (ref 3.5–5.0)
Alkaline Phosphatase: 66 U/L (ref 38–126)
Anion gap: 10 (ref 5–15)
BUN: <5 mg/dL — ABNORMAL LOW (ref 6–20)
CO2: 25 mmol/L (ref 22–32)
Calcium: 8.7 mg/dL — ABNORMAL LOW (ref 8.9–10.3)
Chloride: 102 mmol/L (ref 98–111)
Creatinine, Ser: 0.98 mg/dL (ref 0.61–1.24)
GFR, Estimated: >60 mL/min (ref >60)
Glucose, Bld: 111 mg/dL — ABNORMAL HIGH (ref 70–99)
Potassium: 3 mmol/L — ABNORMAL LOW (ref 3.5–5.1)
Sodium: 137 mmol/L (ref 135–145)
Total Bilirubin: 0.5 mg/dL (ref 0.3–1.2)
Total Protein: 6.6 g/dL (ref 6.5–8.1)

## 2021-05-01 LAB — CBC WITH DIFFERENTIAL/PLATELET
Abs Immature Granulocytes: 0.04 10*3/uL (ref 0.00–0.07)
Basophils Absolute: 0 10*3/uL (ref 0.0–0.1)
Basophils Relative: 0 %
Eosinophils Absolute: 0 10*3/uL (ref 0.0–0.5)
Eosinophils Relative: 0 %
HCT: 40.6 % (ref 39.0–52.0)
Hemoglobin: 13.1 g/dL (ref 13.0–17.0)
Immature Granulocytes: 1 %
Lymphocytes Relative: 8 %
Lymphs Abs: 0.7 10*3/uL (ref 0.7–4.0)
MCH: 30.5 pg (ref 26.0–34.0)
MCHC: 32.3 g/dL (ref 30.0–36.0)
MCV: 94.4 fL (ref 80.0–100.0)
Monocytes Absolute: 0.5 10*3/uL (ref 0.1–1.0)
Monocytes Relative: 6 %
Neutro Abs: 7.6 10*3/uL (ref 1.7–7.7)
Neutrophils Relative %: 85 %
Platelets: 244 10*3/uL (ref 150–400)
RBC: 4.3 MIL/uL (ref 4.22–5.81)
RDW: 13.3 % (ref 11.5–15.5)
WBC: 8.8 10*3/uL (ref 4.0–10.5)
nRBC: 0 % (ref 0.0–0.2)

## 2021-05-01 LAB — TROPONIN I (HIGH SENSITIVITY)
Troponin I (High Sensitivity): 3 ng/L (ref <18)
Troponin I (High Sensitivity): 3 ng/L (ref <18)

## 2021-05-01 LAB — D-DIMER, QUANTITATIVE: D-Dimer, Quant: 0.3 ug/mL-FEU (ref 0.00–0.50)

## 2021-05-01 LAB — RESP PANEL BY RT-PCR (FLU A&B, COVID) ARPGX2
Influenza A by PCR: NEGATIVE
Influenza B by PCR: NEGATIVE
SARS Coronavirus 2 by RT PCR: NEGATIVE

## 2021-05-01 MED ORDER — LIDOCAINE 5 % EX PTCH
1.0000 | MEDICATED_PATCH | Freq: Once | CUTANEOUS | Status: DC
  Administered 2021-05-01: 1 via TRANSDERMAL
  Filled 2021-05-01: qty 1

## 2021-05-01 MED ORDER — POTASSIUM CHLORIDE CRYS ER 20 MEQ PO TBCR
40.0000 meq | EXTENDED_RELEASE_TABLET | Freq: Once | ORAL | Status: AC
  Administered 2021-05-01: 40 meq via ORAL
  Filled 2021-05-01: qty 2

## 2021-05-01 MED ORDER — METHOCARBAMOL 500 MG PO TABS
500.0000 mg | ORAL_TABLET | Freq: Once | ORAL | Status: AC
  Administered 2021-05-01: 500 mg via ORAL
  Filled 2021-05-01: qty 1

## 2021-05-01 MED ORDER — POTASSIUM CHLORIDE ER 20 MEQ PO TBCR: 20.0000 meq | EXTENDED_RELEASE_TABLET | Freq: Every day | ORAL | 0 refills | Status: DC

## 2021-05-01 MED ORDER — METHOCARBAMOL 500 MG PO TABS: 1000.0000 mg | ORAL_TABLET | Freq: Two times a day (BID) | ORAL | 0 refills | Status: DC

## 2021-05-01 MED ORDER — MAGNESIUM OXIDE -MG SUPPLEMENT 400 (240 MG) MG PO TABS
400.0000 mg | ORAL_TABLET | Freq: Once | ORAL | Status: AC
  Administered 2021-05-01: 400 mg via ORAL
  Filled 2021-05-01: qty 1

## 2021-05-01 NOTE — ED Notes (Signed)
Patient verbalizes understanding of discharge instructions. Prescriptions and follow-up care reviewed. Opportunity for questioning and answers were provided. Armband removed by staff, pt discharged from ED ambulatory.  

## 2021-05-01 NOTE — ED Provider Notes (Signed)
[facility] EMERGENCY DEPARTMENT Provider Note   CSN: [id number] Arrival date & time: 05/01/21  1435     History Chief Complaint  Patient presents with   Chest Pain    Richard Glass is a 50 y.o. male.  50 yo male with history as below presents to ED 2/2 chest pain. Sudden onset of chest pain while he was eating lunch today, has been constant since onset. Worse with exertion or deep respiration. Mild cough non productive that has been unchanged. No fevers or chills. No nausea or emesis. He has been tolerating PO without difficulty. No recent medication changes, no recent diet changes. Denies illicit drug or IVDU use. Pain localized to left chest wall, worse with palpation directly. No rashes.   The history is provided by the patient. No language interpreter was used.  Chest Pain Associated symptoms: shortness of breath   Associated symptoms: no abdominal pain, no cough, no dysphagia, no fever, no headache, no nausea, no palpitations and no vomiting    HPI: A 50 year old patient with a history of hypercholesterolemia presents for evaluation of chest pain. Initial onset of pain was less than one hour ago. The patient's chest pain is described as heaviness/pressure/tightness, is sharp and is worse with exertion. The patient's chest pain is middle- or left-sided, is not well-localized and does not radiate to the arms/jaw/neck. The patient does not complain of nausea and denies diaphoresis. The patient has smoked in the past 90 days. The patient has no history of stroke, has no history of peripheral artery disease, denies any history of treated diabetes, has no relevant family history of coronary artery disease (first degree relative at less than age 42), is not hypertensive and does not have an elevated BMI (>=30).   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    Allergic rhinitis 11/13/2006        Anxiety    Anxiety state 08/08/2013   Asthma    ASTHMA, INTERMITTENT 07/08/2010   Qualifier: Diagnosis of  By: Walker Kehr MD, Wayne     Back pain of lumbar region with sciatica 11/13/2006   MRI 12/2014 - lumbar DDD without focal neural impingement  MRI Lumbar 10/30/16 ([place]) Small central L5-S1 disc extrusion with minimal cranial migration and associated annular fissure approaches descending left S1 nerve roots without contact or displacement. Minimal bulging disc height loss without spinal canal stenosis or neural foraminal narrowing.  Minimal bulging disc at L2-L3 through L4-L5 without spinal canal stenosis or neural foraminal narrowing.  MRI Sacrum 10/30/16 ([place]) No fracture. Small lesion left sacral ala most likely a subchondral cyst/erosion adjacent to the sacroiliac joint.      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 The craniocervical junction is normal and cervical cord signal is normal. The central spinal canal and neural foramina are widely patent at all levels. Scattered, mild degenerative change appears most notable at C4-5. Imaged paraspinous structures are unremarkable.   IMPRESSION: Negative for central canal or foraminal narrowing. No finding to explain the patient's symptoms. Scattered, mild facet degenerative disease is noted.    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   Morton's neuroma of left foot 01/18/2016   Osteopenia 10/10/2016   Overweight(278.02) 06/13/2009   Qualifier: Diagnosis of  By: Hedy Camara     Pain in joint, shoulder region 07/05/2014   LEFT > Right Reports hx rotator cuff surgery on rigt shoulder previously (Dr Nicholes Stairs) but no notes available XRAY right shoulder showed some proliferative changes distal rigt clavicle    Rash and nonspecific skin eruption 10/10/2016   Restless leg 09/29/2007   Qualifier: Diagnosis of  By: Walker Kehr MD, Wayne     Restless legs syndrome (RLS)    Sciatica of left side 10/26/2014   Seizures (HCC)    last >57yr   SI (sacroiliac) pain 03/20/2017   Sinusitis chronic, frontal    Status post lumbar spinal fusion 03/20/2017   Subacromial or subdeltoid bursitis 08/08/2009   MRI C-spine 2011([facility]) - normal MRI left shoulder 10/2010 ([facility]) - AC degenerative disease, normal rotator cuff 12/2012 -debridement, acromioplasty and distal clavicle excision of left shoulder, arthroscopy also performed an no need for rotator cuff repair - performed by [facility] 02/2013 - Nerve Conduction Studies ([facility]) - ulnar nerve compression 02/2013- taken back to OR for Ulnar nerve decompression 06/2013 Repeat MRI left shoulder - subacromial/subdeltoid bursitis, a small intersubstance tear to the distal infraspinatus and evidence of interval resection of the distal clavicle 06/2013 - steroid injection of left biceps 09/2013 -Repeat EMG showed improvement of ulnar nerve compression 10/2013 -  [facility] to 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) found 2 small tubular adenomas (3 mm & 4 mm) - recommend follow up surveillance colonoscopy in 7 years.    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   Weight loss, non-intentional 10/14/2019   Bilateral leg weakness 09/03/2019   S/P right hip fracture 07/07/2019   Right leg pain 06/26/2019   Femoral fracture ([facility] 06/26/2019   Hearing loss 05/01/2019   Chronic left leg pain 11/20/2018   Allodynia, left leg 11/20/2018   History  of traumatic brain injury 02/23/2018   High risk medication use, Combination of Opioid and Benzodiazapine 02/12/2018   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   Encephalomalacia on imaging study 05/01/2017   SI (sacroiliac) pain 03/20/2017   Enchondroma of left fibular head 05/17/2016   Chronic pain syndrome 01/18/2016   Anxiety state 08/08/2013   Cerumen impaction 08/06/2013   Insomnia 02/02/2013   Right flank pain 05/21/2011   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   Encounter for chronic pain management 07/04/2009   Restless leg 09/29/2007   Hyperlipidemia 11/13/2006   Allergic rhinitis 11/13/2006   GASTROESOPHAGEAL REFLUX, NO ESOPHAGITIS 11/13/2006   Low back pain 11/13/2006   Convulsions ([place])  11/13/2006   Seizure ([place]) 11/13/2006    Past Surgical History:  Procedure Laterality Date   [place]   struck by golf ball-50 yrs old   CRANIOTOMY     metal plate   HARDWARE REMOVAL Right 11/22/2019   Procedure: HARDWARE REMOVAL;  Surgeon: Leandrew Koyanagi, MD;  Location: [place];  Service: Orthopedics;  Laterality: Right;   HIP PINNING,CANNULATED Right 06/27/2019   Procedure: HIP PERCUTANEOUS PINNING;  Surgeon: Meredith Pel, MD;  Location: [place];  Service: Orthopedics;  Laterality: Right;   NASAL FRACTURE SURGERY  1997   SACROILIAC JOINT FUSION Left 03/20/2017   Procedure: SACROILIAC JOINT FUSION;  Surgeon: Melina Schools, MD;  Location: [place];  Service: Orthopedics;  Laterality: Left;  90 mins   SHOULDER ARTHROSCOPY Left    "cleaned arthritis out"   SHOULDER SURGERY Right 2012   Rotator cuff surgery   TONSILLECTOMY     TOTAL HIP ARTHROPLASTY Right 11/22/2019   Procedure: CONVERSION OF PREVIOUS RIGHT HIP SURGERY TO TOTAL HIP ARTHROPLASTY ANTERIOR APPROACH, HARDWARE REMOVAL;  Surgeon: Leandrew Koyanagi, MD;  Location: [place];  Service: Orthopedics;  Laterality: Right;   UVULOPALATOPHARYNGOPLASTY (UPPP)/TONSILLECTOMY/SEPTOPLASTY  2001       Family History  Problem Relation Age of Onset   Cancer Father        lung   Heart disease Father    Hypertension Mother    Seizures Neg Hx    Colon cancer Neg Hx    Colon polyps Neg Hx    Esophageal cancer Neg Hx    Stomach cancer Neg Hx    Rectal cancer Neg Hx     Social History   Tobacco Use   Smoking status: Former    Packs/day: 1.00    Years: 22.00    Pack years: 22.00    Types: Cigarettes    Quit date: 06/22/2012    Years since quitting: 8.8   Smokeless tobacco: Current    Types: Snuff   Tobacco comments:    per patient uses "dip" about twice a day   Vaping Use   Vaping Use: Never used  Substance Use Topics   Alcohol use: No    Alcohol/week: 0.0 standard drinks    Comment: none since 7/11   Drug use: No     Home Medications Prior to Admission medications   Medication Sig Start Date End Date Taking? Authorizing Provider  methocarbamol (ROBAXIN) 500 MG tablet Take 2 tablets (1,000 mg total) by mouth 2 (two) times daily. 05/01/21  Yes [name], DO  potassium chloride 20 MEQ TBCR Take 20 mEq by mouth daily for 3 days. 05/01/21 05/04/21 Yes [name], DO  ALPRAZolam [name]) 1 MG tablet TAKE 1/2 TABLET BY MOUTH THREE TIMES DAILY AS NEEDED. 02/07/21   [name],  NP  Calcium Carb-Cholecalciferol ([employer] CALCIUM + D3) 600-800 MG-UNIT TABS Take 1 tablet by mouth 2 (two) times daily before a meal.    [provider]  diclofenac Sodium (VOLTAREN) 1 % GEL Apply 2 g topically 4 (four) times daily. 10/10/20   [name], PA-C  fluticasone (FLONASE) 50 MCG/ACT nasal spray PLACE 2 SPRAYS IN EACH NOSTRIL EVERY DAY Patient taking differently: Place 2 sprays into both nostrils daily as needed for allergies. 05/04/19   [name], [name], MD  gabapentin (NEURONTIN) 600 MG tablet Take 2 tablets (1,200 mg total) by mouth 3 (three) times daily. Patient taking differently: Take by mouth. Takes 1 tablet in the morning and 2 tablets at bedtime. 04/06/19   [name], [name], MD  hydrOXYzine (ATARAX/VISTARIL) 25 MG tablet Take 25 mg by mouth 3 (three) times daily as needed. 02/19/21   [provider]  Multiple Vitamin (MULTIVITAMIN) tablet Take 1 tablet by mouth daily.    [provider]  Multiple Vitamins-Minerals (CENTRUM SILVER PO) Take 1 each by mouth daily.    [provider]  oxyCODONE-acetaminophen (PERCOCET) 7.5-325 MG tablet Take 1 tablet by mouth every 4 (four) hours as needed for severe pain.    [provider]  PHENobarbital (LUMINAL) 64.8 MG tablet TAKE 3 TABLETS (194.4 MG TOTAL) BY MOUTH AT BEDTIME. 04/23/21   Kathrynn Ducking, MD  zonisamide (ZONEGRAN) 100 MG capsule Take 1 capsule (100 mg total) by mouth 2 (two) times daily. 02/27/21   [name], NP     Allergies    Ibuprofen and No known allergies  Review of Systems   Review of Systems  Constitutional:  Negative for chills and fever.  HENT:  Negative for facial swelling and trouble swallowing.   Eyes:  Negative for photophobia and visual disturbance.  Respiratory:  Positive for shortness of breath. Negative for cough.   Cardiovascular:  Positive for chest pain. Negative for palpitations.  Gastrointestinal:  Negative for abdominal pain, nausea and vomiting.  Endocrine: Negative for polydipsia and polyuria.  Genitourinary:  Negative for difficulty urinating and hematuria.  Musculoskeletal:  Negative for gait problem and joint swelling.  Skin:  Negative for pallor and rash.  Neurological:  Negative for syncope and headaches.  Psychiatric/Behavioral:  Negative for agitation and confusion.    Physical Exam Updated Vital Signs BP 114/76 (BP Location: Right Arm)   Pulse 78   Temp 99.3 F (37.4 C)   Resp 18   SpO2 98%   Physical Exam Vitals and nursing note reviewed.  Constitutional:      General: He is not in acute distress.    Appearance: He is well-developed.  HENT:     Head: Normocephalic and atraumatic.     Right Ear: External ear normal.     Left Ear: External ear normal.     Mouth/Throat:     Mouth: Mucous membranes are moist.  Eyes:     General: No scleral icterus. Cardiovascular:     Rate and Rhythm: Normal rate and regular rhythm.     Pulses: Normal pulses.          Radial pulses are 2+ on the right side and 2+ on the left side.       Dorsalis pedis pulses are 2+ on the right side and 2+ on the left side.     Heart sounds: Normal heart sounds.  Pulmonary:     Effort: Pulmonary effort is normal. No respiratory distress.     Breath  sounds: Normal breath sounds.  Chest:    Abdominal:     General: Abdomen is flat.     Palpations: Abdomen is soft.     Tenderness: There is no abdominal tenderness.  Musculoskeletal:        General: Normal range of motion.      Cervical back: Normal range of motion.     Right lower leg: No edema.     Left lower leg: No edema.  Skin:    General: Skin is warm and dry.     Capillary Refill: Capillary refill takes less than 2 seconds.  Neurological:     Mental Status: He is alert and oriented to person, place, and time.     GCS: GCS eye subscore is 4. GCS verbal subscore is 5. GCS motor subscore is 6.     Motor: No tremor.     Gait: Gait is intact.  Psychiatric:        Mood and Affect: Mood normal.        Behavior: Behavior normal.    ED Results / Procedures / Treatments   Labs (all labs ordered are listed, but only abnormal results are displayed) Labs Reviewed  COMPREHENSIVE METABOLIC PANEL - Abnormal; Notable for the following components:      Result Value   Potassium 3.0 (*)    Glucose, Bld 111 (*)    BUN <5 (*)    Calcium 8.7 (*)    All other components within normal limits  RESP PANEL BY RT-PCR (FLU A&B, COVID) ARPGX2  CBC WITH DIFFERENTIAL/PLATELET  D-DIMER, QUANTITATIVE  TROPONIN I (HIGH SENSITIVITY)  TROPONIN I (HIGH SENSITIVITY)    EKG EKG Interpretation  Date/Time:  Tuesday May 01 2021 14:41:25 EDT Ventricular Rate:  99 PR Interval:  142 QRS Duration: 100 QT Interval:  322 QTC Calculation: 413 R Axis:   39 Text Interpretation: Normal sinus rhythm Normal ECG Similar to prior tracing Confirmed by Wynona Dove ([id number]) on 05/01/2021 8:42:21 PM  Radiology DG Chest 1 View  Result Date: 05/01/2021 CLINICAL DATA:  pain that started while eating at [place] today. Pain worse when he takes a deep breath. Pt also having intermittent tremors. EXAM: CHEST  1 VIEW COMPARISON:  Chest x-ray 08/30/2020, CT chest 08/30/2020 FINDINGS: The heart size and mediastinal contours are within normal limits. No focal consolidation. No pulmonary edema. No pleural effusion. No pneumothorax. No acute osseous abnormality. IMPRESSION: No active disease. Electronically Signed   By: Iven Finn M.D.   On:  05/01/2021 15:31    Procedures Procedures   Medications Ordered in ED Medications  methocarbamol (ROBAXIN) tablet 500 mg (has no administration in time range)  lidocaine (LIDODERM) 5 % 1 patch (has no administration in time range)  potassium chloride SA (KLOR-CON) CR tablet 40 mEq (40 mEq Oral Given 05/01/21 2003)  magnesium oxide (MAG-OX) tablet 400 mg (400 mg Oral Given 05/01/21 2003)    ED Course  I have reviewed the triage vital signs and the nursing notes.  Pertinent labs & imaging results that were available during my care of the patient were reviewed by me and considered in my medical decision making (see chart for details).    MDM Rules/Calculators/A&P HEAR Score: 34                         50 yo male with history as above to ED for chest pain. Constant, pleuritic, worse with exertion. Exam is stable, mild tachypnea on vitals,  also low grade temp at 99; otherwise WNL. Serious etiology considered.  Currently symptomatic, NSAID allergy so no ASA given at this time.   Labs reviewed. Potassium is low, will replace orally with magnesium. No ECG changes. Given oral K on discharge, advised to f/u with PCP in 2-3 days for repeat potassium level. Discussed dietary changes.   CXR reviewed and is unremarkable.   Low risk HEAR score, Troponin is negative. Low risk Well's score; will obtain d-dimer this is negative. COVID 19 and flu are also negative.    The patient's chest pain is not suggestive of pulmonary embolus, cardiac ischemia, aortic dissection, pericarditis, myocarditis, pulmonary embolism, pneumothorax, pneumonia, Zoster, or esophageal perforation, or other serious etiology.  Historically not abrupt in onset, tearing or ripping, pulses symmetric. EKG nonspecific for ischemia/infarction. No dysrhythmias, brugada, WPW, prolonged QT noted. [XR reviewed and WNL. Troponin negative x2. Labs without demonstration of acute pathology unless otherwise noted above. Low HEART Score: 0-3  points (0.9-1.7% risk of MACE). Given the extremely low risk of these diagnoses further testing and evaluation for these possibilities does not appear to be indicated at this time. Patient in no distress and overall condition improved here in the ED. Detailed discussions were had with the patient regarding current findings, and need for close f/u with PCP or on call doctor. The patient has been instructed to return immediately if the symptoms worsen in any way for re-evaluation. Patient verbalized understanding and is in agreement with current care plan. All questions answered prior to discharge.    Final Clinical Impression(s) / ED Diagnoses Final diagnoses:  Acute nonspecific chest pain with low risk of coronary artery disease  Costochondritis  Hypokalemia    Rx / DC Orders ED Discharge Orders          Ordered    potassium chloride 20 MEQ TBCR  Daily        05/01/21 2111    methocarbamol (ROBAXIN) 500 MG tablet  2 times daily        05/01/21 2113             [name], DO 05/01/21 2119

## 2021-05-01 NOTE — ED Provider Notes (Signed)
Emergency Medicine Provider Triage Evaluation Note  Richard Glass , a 51 y.o. male  was evaluated in triage.  Pt complains of chest pain that started today  Review of Systems  Positive: No radiation Negative: No fever or cough  Physical Exam  BP 136/79 (BP Location: Left Arm)   Pulse (!) 103   Temp 99 F (37.2 C) (Oral)   Resp 17   SpO2 99%  Gen:   Awake, no distress   Resp:  Normal effort  MSK:   Moves extremities without difficulty  Other:    Medical Decision Making  Medically screening exam initiated at 2:52 PM.  Appropriate orders placed.  [name] was informed that the remainder of the evaluation will be completed by another provider, this initial triage assessment does not replace that evaluation, and the importance of remaining in the ED until their evaluation is complete.     Fransico Meadow, [place] 05/01/21 1453    Blanchie Dessert, MD 05/02/21 1406

## 2021-05-01 NOTE — ED Triage Notes (Signed)
Pt bib ems for chest pain that started while eating at [employer] today. Pain worse when he takes a deep breath. Pt also having intermittent tremors.

## 2021-05-08 ENCOUNTER — Other Ambulatory Visit: Payer: Self-pay

## 2021-05-08 ENCOUNTER — Ambulatory Visit
Admission: RE | Admit: 2021-05-08 | Discharge: 2021-05-08 | Disposition: A | Discharge status: Home / Self Care | Payer: Medicare Other | Source: Ambulatory Visit | Attending: Orthopaedic Surgery | Admitting: Orthopaedic Surgery

## 2021-05-08 DIAGNOSIS — M542 Cervicalgia: Secondary | ICD-10-CM | POA: Diagnosis not present

## 2021-05-14 DIAGNOSIS — R0602 Shortness of breath: Secondary | ICD-10-CM | POA: Diagnosis not present

## 2021-05-14 DIAGNOSIS — Z131 Encounter for screening for diabetes mellitus: Secondary | ICD-10-CM | POA: Diagnosis not present

## 2021-05-14 DIAGNOSIS — K0889 Other specified disorders of teeth and supporting structures: Secondary | ICD-10-CM | POA: Diagnosis not present

## 2021-05-14 DIAGNOSIS — Z0000 Encounter for general adult medical examination without abnormal findings: Secondary | ICD-10-CM | POA: Diagnosis not present

## 2021-05-14 DIAGNOSIS — R350 Frequency of micturition: Secondary | ICD-10-CM | POA: Diagnosis not present

## 2021-05-14 DIAGNOSIS — Z1159 Encounter for screening for other viral diseases: Secondary | ICD-10-CM | POA: Diagnosis not present

## 2021-05-14 DIAGNOSIS — R5383 Other fatigue: Secondary | ICD-10-CM | POA: Diagnosis not present

## 2021-05-14 DIAGNOSIS — E7800 Pure hypercholesterolemia, unspecified: Secondary | ICD-10-CM | POA: Diagnosis not present

## 2021-05-14 DIAGNOSIS — Z6822 Body mass index (BMI) 22.0-22.9, adult: Secondary | ICD-10-CM | POA: Diagnosis not present

## 2021-05-14 DIAGNOSIS — Z79899 Other long term (current) drug therapy: Secondary | ICD-10-CM | POA: Diagnosis not present

## 2021-05-14 DIAGNOSIS — E559 Vitamin D deficiency, unspecified: Secondary | ICD-10-CM | POA: Diagnosis not present

## 2021-05-15 ENCOUNTER — Ambulatory Visit (INDEPENDENT_AMBULATORY_CARE_PROVIDER_SITE_OTHER): Payer: Medicare Other | Admitting: Orthopaedic Surgery

## 2021-05-15 ENCOUNTER — Encounter: Payer: Self-pay | Admitting: Orthopaedic Surgery

## 2021-05-15 ENCOUNTER — Other Ambulatory Visit: Payer: Self-pay

## 2021-05-15 DIAGNOSIS — M25531 Pain in right wrist: Secondary | ICD-10-CM

## 2021-05-15 NOTE — Progress Notes (Signed)
Office Visit Note   Patient: Richard Glass           Date of Birth: 1971/06/12           MRN: [id number] Visit Date: 05/15/2021              Requested by: Riki Sheer, NP [place],  [place] [id number] PCP: Riki Sheer, NP   Assessment & Plan: Visit Diagnoses:  1. Pain in right wrist     Plan: MRI of the cervical spine is fairly unrevealing and does not explain his symptoms.  He has had a nerve conduction study with Dr. Ernestina Patches that has been normal.  I given these negative results and continued symptoms I recommend a referral to neurology for further evaluation and treatment.  Follow-Up Instructions: No follow-ups on file.   Orders:  No orders of the defined types were placed in this encounter.  No orders of the defined types were placed in this encounter.     Procedures: No procedures performed   Clinical Data: No additional findings.   Subjective: Chief Complaint  Patient presents with   Neck - Pain    HPI Richard Glass returns today for review of recent cervical spine MRI.  In terms of his symptoms he continues to report numbness in the hand radiating up into the forearm.  Review of Systems   Objective: Vital Signs: There were no vitals taken for this visit.  Physical Exam  Ortho Exam Exam is unchanged. Specialty Comments:  No specialty comments available.  Imaging: No results found.   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   Weight loss, non-intentional 10/14/2019   Bilateral leg weakness 09/03/2019   S/P right hip fracture 07/07/2019   Right leg pain 06/26/2019    Femoral fracture ([place]) 06/26/2019   Hearing loss 05/01/2019   Chronic left leg pain 11/20/2018   Allodynia, left leg 11/20/2018   History of traumatic brain injury 02/23/2018   High risk medication use, Combination of Opioid and Benzodiazapine 02/12/2018   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   Encephalomalacia on imaging study 05/01/2017   SI (sacroiliac) pain 03/20/2017   Enchondroma of left fibular head 05/17/2016   Chronic pain syndrome 01/18/2016   Anxiety state 08/08/2013   Cerumen impaction 08/06/2013   Insomnia 02/02/2013   Right flank pain 05/21/2011   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   Allergic rhinitis 11/13/2006        Anxiety    Anxiety state 08/08/2013   Asthma    ASTHMA, INTERMITTENT 07/08/2010   Qualifier: Diagnosis of  By: Walker Kehr MD, Wayne     Back pain of lumbar region with sciatica 11/13/2006   MRI 12/2014 - lumbar DDD without focal neural impingement  MRI Lumbar 10/30/16 ([facility]) Small central L5-S1 disc extrusion with minimal cranial migration and associated annular fissure approaches descending left S1 nerve roots without contact or displacement. Minimal bulging disc height loss without spinal canal stenosis or neural foraminal narrowing.  Minimal bulging disc at L2-L3 through L4-L5 without spinal canal stenosis or neural foraminal narrowing.  MRI Sacrum 10/30/16 ([facility]) No fracture. Small lesion left sacral ala most likely a subchondral cyst/erosion adjacent to the sacroiliac joint.      Bone tumor 05/17/2016   Left Proximal  Fibula (MRI September 2017)   Carpal tunnel syndrome 01/09/2015   Left 12/2014    Cervical spine pain 02/06/2015   MRI 03/2015 FINDINGS: Vertebral body height, signal and alignment are normal. The craniocervical junction is normal and cervical cord signal is normal. The central spinal canal and neural foramina are widely patent at all levels. Scattered, mild degenerative change appears most notable at C4-5. Imaged paraspinous structures are unremarkable.   IMPRESSION: Negative for central canal or foraminal narrowing. No finding to explain the patient's symptoms. Scattered, mild facet degenerative disease is noted.    Chronic low back pain    Chronic pain 01/18/2016   Chronic pain syndrome 01/18/2016   Coccyx pain 02/06/2015   Contact dermatitis or eczema 02/13/2018   Convulsions ([place]) 11/13/2006     Cystic encephalomalacia left temporal and parietal regions from remote injury.    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 [id number]   Morton's neuroma of left foot 01/18/2016   Osteopenia 10/10/2016   Overweight(278.02) 06/13/2009   Qualifier: Diagnosis of  By: Hedy Camara     Pain in joint, shoulder region 07/05/2014   LEFT > Right Reports hx rotator cuff surgery on rigt shoulder previously (Dr Nicholes Stairs) but no notes available XRAY right shoulder showed some proliferative changes distal rigt clavicle    Rash and nonspecific skin eruption 10/10/2016   Restless leg 09/29/2007   Qualifier:  Diagnosis of  By: Walker Kehr MD, Wayne     Restless legs syndrome (RLS)    Sciatica of left side 10/26/2014   Seizures ([facility])    last >32yr   SI (sacroiliac) pain 03/20/2017   Sinusitis chronic, frontal    Status post lumbar spinal fusion 03/20/2017   Subacromial or subdeltoid bursitis 08/08/2009   MRI C-spine 2011([facility]) - normal MRI left shoulder 10/2010 ([facility]) - AC degenerative disease, normal rotator cuff 12/2012 -debridement, acromioplasty and distal clavicle excision of left shoulder, arthroscopy also performed an no need for rotator cuff repair - performed by [facility] 02/2013 - Nerve Conduction Studies ([facility]) - ulnar nerve compression 02/2013- taken back to OR for Ulnar nerve decompression 06/2013 Repeat MRI left shoulder - subacromial/subdeltoid bursitis, a small intersubstance tear to the distal infraspinatus and evidence of interval resection of the distal clavicle 06/2013 - steroid injection of left biceps 09/2013 -Repeat EMG showed improvement of ulnar nerve compression 10/2013 - [facility] due to intractable pain 11/2013 - Seen by Dr[facility] 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 [facility]) found 2 small tubular adenomas (3 mm & 4 mm) - recommend follow up surveillance colonoscopy in 7 years.    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   Presumed surgery June 2014     Family History  Problem Relation Age of Onset   Cancer Father        lung   Heart disease Father    Hypertension Mother    Seizures Neg Hx    Colon cancer Neg Hx    Colon polyps Neg Hx    Esophageal cancer Neg Hx    Stomach cancer Neg Hx    Rectal cancer Neg Hx     Past Surgical History:  Procedure Laterality Date   [place]   struck by golf ball-50 yrs old   CRANIOTOMY     metal plate   HARDWARE REMOVAL Right 11/22/2019   Procedure: HARDWARE REMOVAL;  Surgeon: Leandrew Koyanagi, MD;  Location: [place];  Service: Orthopedics;  Laterality: Right;   HIP PINNING,CANNULATED Right 06/27/2019   Procedure: HIP PERCUTANEOUS PINNING;  Surgeon: Meredith Pel, MD;  Location: [place];  Service: Orthopedics;  Laterality: Right;   NASAL FRACTURE SURGERY  1997   SACROILIAC JOINT FUSION Left 03/20/2017   Procedure: SACROILIAC JOINT FUSION;  Surgeon: Melina Schools, MD;  Location: [place];  Service: Orthopedics;  Laterality: Left;  90 mins   SHOULDER ARTHROSCOPY Left    "cleaned arthritis out"   SHOULDER SURGERY Right 2012   Rotator cuff surgery   TONSILLECTOMY     TOTAL HIP ARTHROPLASTY Right 11/22/2019   Procedure: CONVERSION OF PREVIOUS RIGHT HIP SURGERY TO TOTAL HIP ARTHROPLASTY ANTERIOR APPROACH, HARDWARE REMOVAL;  Surgeon: Leandrew Koyanagi, MD;  Location: [place];  Service: Orthopedics;  Laterality: Right;   UVULOPALATOPHARYNGOPLASTY (UPPP)/TONSILLECTOMY/SEPTOPLASTY  2001   Social History   Occupational History   Occupation: disabled  Tobacco Use   Smoking status: Former    Packs/day: 1.00    Years: 22.00    Pack years: 22.00    Types: Cigarettes    Quit date: 06/22/2012    Years since quitting: 8.9   Smokeless tobacco: Current    Types: Snuff   Tobacco comments:    per patient uses "dip" about twice a day   Vaping Use   Vaping Use: Never used  Substance and Sexual Activity   Alcohol use: No    Alcohol/week: 0.0 standard drinks    Comment: none since 7/11   Drug use: No   Sexual activity: Never

## 2021-05-16 ENCOUNTER — Other Ambulatory Visit: Payer: Self-pay

## 2021-05-16 DIAGNOSIS — M542 Cervicalgia: Secondary | ICD-10-CM

## 2021-05-17 ENCOUNTER — Other Ambulatory Visit: Payer: Self-pay | Admitting: Neurology

## 2021-05-29 DIAGNOSIS — Z6822 Body mass index (BMI) 22.0-22.9, adult: Secondary | ICD-10-CM | POA: Diagnosis not present

## 2021-05-29 DIAGNOSIS — Z008 Encounter for other general examination: Secondary | ICD-10-CM | POA: Diagnosis not present

## 2021-05-29 DIAGNOSIS — R942 Abnormal results of pulmonary function studies: Secondary | ICD-10-CM | POA: Diagnosis not present

## 2021-05-29 DIAGNOSIS — J439 Emphysema, unspecified: Secondary | ICD-10-CM | POA: Diagnosis not present

## 2021-05-29 DIAGNOSIS — J9801 Acute bronchospasm: Secondary | ICD-10-CM | POA: Diagnosis not present

## 2021-05-30 DIAGNOSIS — M546 Pain in thoracic spine: Secondary | ICD-10-CM | POA: Diagnosis not present

## 2021-05-30 DIAGNOSIS — M542 Cervicalgia: Secondary | ICD-10-CM | POA: Diagnosis not present

## 2021-05-30 DIAGNOSIS — M25519 Pain in unspecified shoulder: Secondary | ICD-10-CM | POA: Diagnosis not present

## 2021-05-30 DIAGNOSIS — M25511 Pain in right shoulder: Secondary | ICD-10-CM | POA: Diagnosis not present

## 2021-05-30 DIAGNOSIS — M62830 Muscle spasm of back: Secondary | ICD-10-CM | POA: Diagnosis not present

## 2021-05-31 DIAGNOSIS — F17200 Nicotine dependence, unspecified, uncomplicated: Secondary | ICD-10-CM | POA: Diagnosis not present

## 2021-05-31 DIAGNOSIS — M25551 Pain in right hip: Secondary | ICD-10-CM | POA: Diagnosis not present

## 2021-05-31 DIAGNOSIS — R030 Elevated blood-pressure reading, without diagnosis of hypertension: Secondary | ICD-10-CM | POA: Diagnosis not present

## 2021-05-31 DIAGNOSIS — G8929 Other chronic pain: Secondary | ICD-10-CM | POA: Diagnosis not present

## 2021-05-31 DIAGNOSIS — M542 Cervicalgia: Secondary | ICD-10-CM | POA: Diagnosis not present

## 2021-05-31 DIAGNOSIS — Z79899 Other long term (current) drug therapy: Secondary | ICD-10-CM | POA: Diagnosis not present

## 2021-05-31 DIAGNOSIS — M5410 Radiculopathy, site unspecified: Secondary | ICD-10-CM | POA: Diagnosis not present

## 2021-06-29 DIAGNOSIS — M546 Pain in thoracic spine: Secondary | ICD-10-CM | POA: Diagnosis not present

## 2021-06-29 DIAGNOSIS — M62830 Muscle spasm of back: Secondary | ICD-10-CM | POA: Diagnosis not present

## 2021-06-29 DIAGNOSIS — M542 Cervicalgia: Secondary | ICD-10-CM | POA: Diagnosis not present

## 2021-06-29 DIAGNOSIS — M25511 Pain in right shoulder: Secondary | ICD-10-CM | POA: Diagnosis not present

## 2021-06-29 DIAGNOSIS — M25519 Pain in unspecified shoulder: Secondary | ICD-10-CM | POA: Diagnosis not present

## 2021-07-02 DIAGNOSIS — M25551 Pain in right hip: Secondary | ICD-10-CM | POA: Diagnosis not present

## 2021-07-02 DIAGNOSIS — M542 Cervicalgia: Secondary | ICD-10-CM | POA: Diagnosis not present

## 2021-07-02 DIAGNOSIS — M5416 Radiculopathy, lumbar region: Secondary | ICD-10-CM | POA: Diagnosis not present

## 2021-07-02 DIAGNOSIS — Z79899 Other long term (current) drug therapy: Secondary | ICD-10-CM | POA: Diagnosis not present

## 2021-07-02 DIAGNOSIS — F17200 Nicotine dependence, unspecified, uncomplicated: Secondary | ICD-10-CM | POA: Diagnosis not present

## 2021-07-02 DIAGNOSIS — G8929 Other chronic pain: Secondary | ICD-10-CM | POA: Diagnosis not present

## 2021-07-27 ENCOUNTER — Other Ambulatory Visit: Payer: Self-pay | Admitting: Neurology

## 2021-07-30 DIAGNOSIS — M25511 Pain in right shoulder: Secondary | ICD-10-CM | POA: Diagnosis not present

## 2021-07-30 DIAGNOSIS — M542 Cervicalgia: Secondary | ICD-10-CM | POA: Diagnosis not present

## 2021-07-30 DIAGNOSIS — M546 Pain in thoracic spine: Secondary | ICD-10-CM | POA: Diagnosis not present

## 2021-07-30 DIAGNOSIS — M25519 Pain in unspecified shoulder: Secondary | ICD-10-CM | POA: Diagnosis not present

## 2021-07-30 DIAGNOSIS — M62830 Muscle spasm of back: Secondary | ICD-10-CM | POA: Diagnosis not present

## 2021-08-01 DIAGNOSIS — Z96641 Presence of right artificial hip joint: Secondary | ICD-10-CM | POA: Diagnosis not present

## 2021-08-01 DIAGNOSIS — R030 Elevated blood-pressure reading, without diagnosis of hypertension: Secondary | ICD-10-CM | POA: Diagnosis not present

## 2021-08-01 DIAGNOSIS — M25551 Pain in right hip: Secondary | ICD-10-CM | POA: Diagnosis not present

## 2021-08-01 DIAGNOSIS — G8929 Other chronic pain: Secondary | ICD-10-CM | POA: Diagnosis not present

## 2021-08-01 DIAGNOSIS — Z6822 Body mass index (BMI) 22.0-22.9, adult: Secondary | ICD-10-CM | POA: Diagnosis not present

## 2021-08-01 DIAGNOSIS — Z79899 Other long term (current) drug therapy: Secondary | ICD-10-CM | POA: Diagnosis not present

## 2021-08-01 DIAGNOSIS — M5416 Radiculopathy, lumbar region: Secondary | ICD-10-CM | POA: Diagnosis not present

## 2021-08-01 DIAGNOSIS — F17200 Nicotine dependence, unspecified, uncomplicated: Secondary | ICD-10-CM | POA: Diagnosis not present

## 2021-08-03 DIAGNOSIS — Z79899 Other long term (current) drug therapy: Secondary | ICD-10-CM | POA: Diagnosis not present

## 2021-08-09 IMAGING — CT CT HIP*R* W/O CM
1 series · 16 of 32 positions shown, 20 images · non-contrast
Comparison: Radiographs dated 09/22/2019 and 07/16/2019 and
06/25/2019

CLINICAL DATA: Follow-up of right hip fracture.

EXAM:
CT OF THE RIGHT HIP WITHOUT CONTRAST
TECHNIQUE: Multidetector CT imaging of the right hip was performed according to
the standard protocol. Multiplanar CT image reconstructions were
also generated.

[Series 3: soft tissue pelvis/hip · axial · 0.37mm/px · z∈[-272,-98]mm · 16 of 66 slices shown, 20 images]
[im 5/66  soft-tissue]
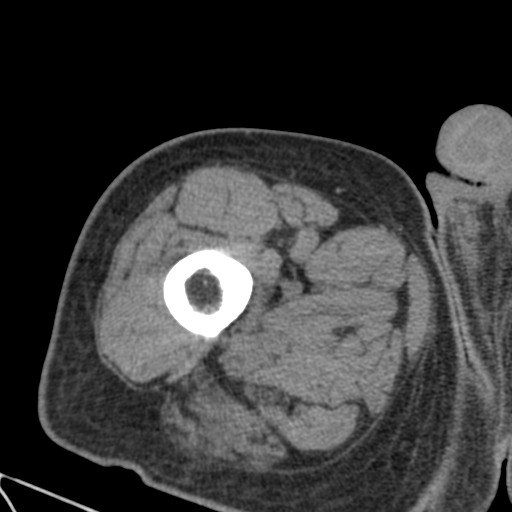
[im 5/66  bone]
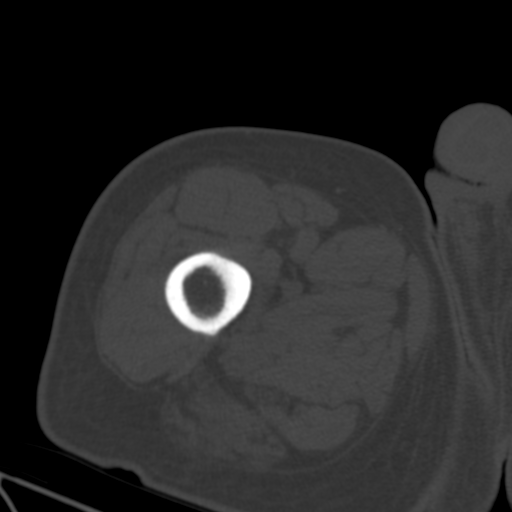
[im 9/66  soft-tissue]
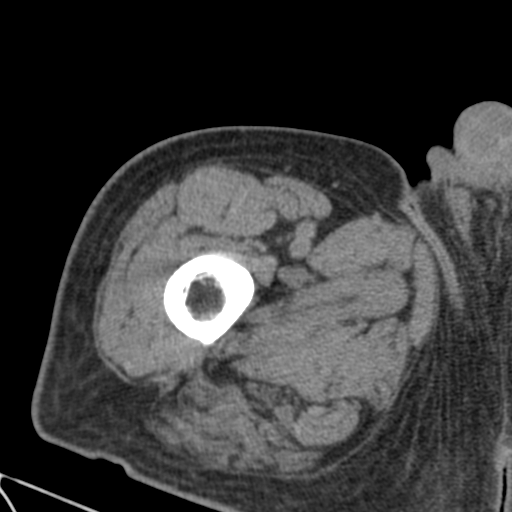
[im 13/66  soft-tissue]
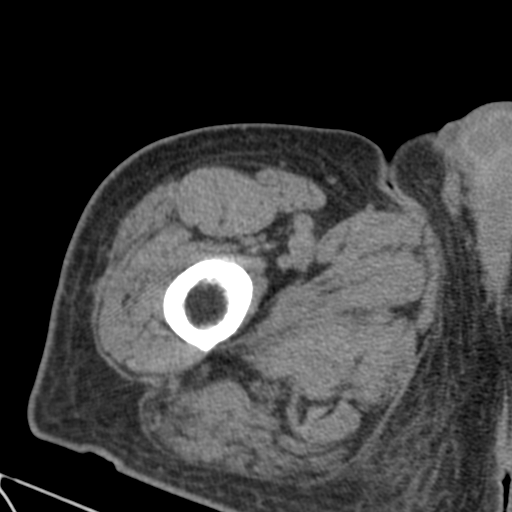
[im 17/66  soft-tissue]
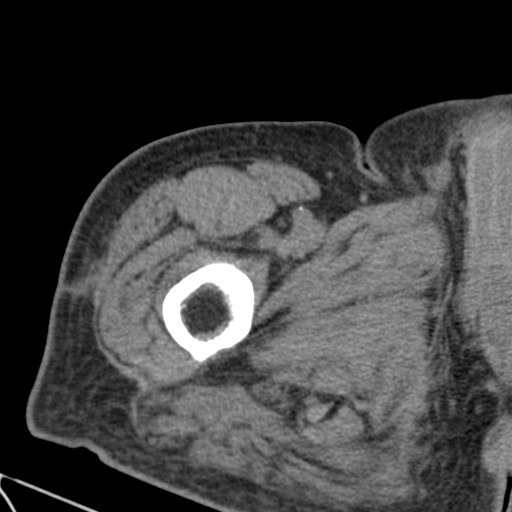
[im 21/66  soft-tissue]
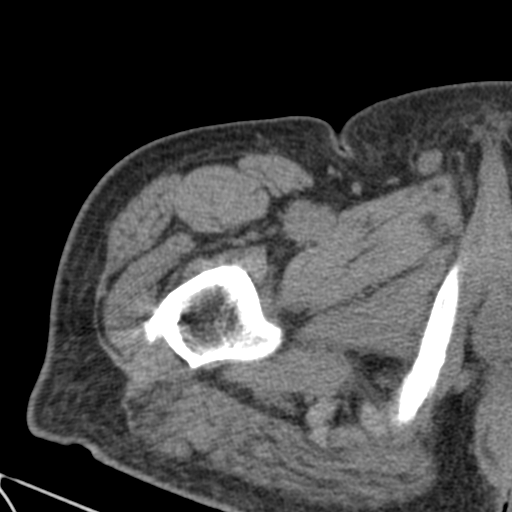
[im 26/66  soft-tissue]
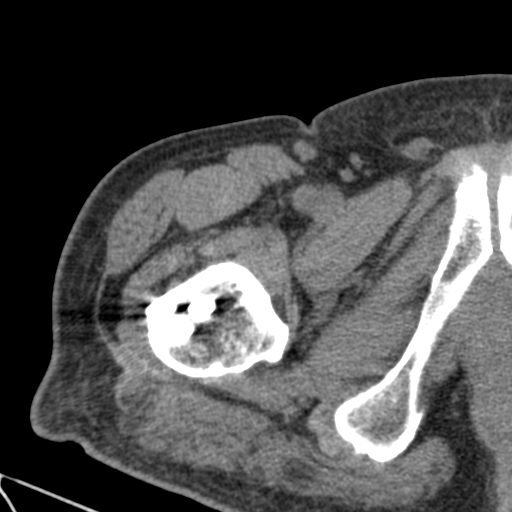
[im 30/66  soft-tissue]
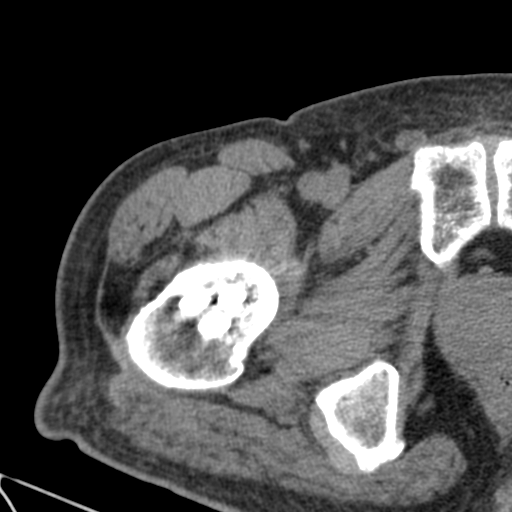
[im 36/66  soft-tissue]
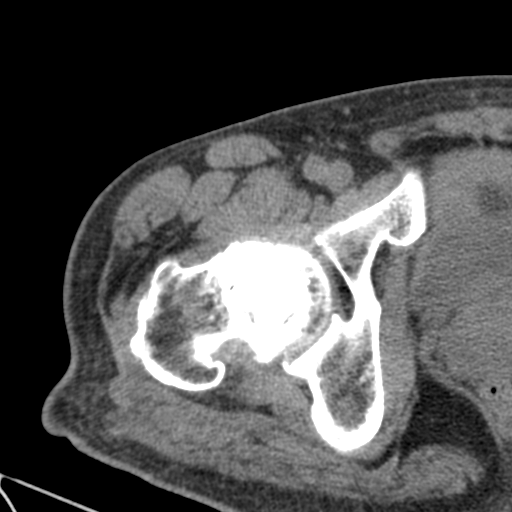
[im 40/66  soft-tissue]
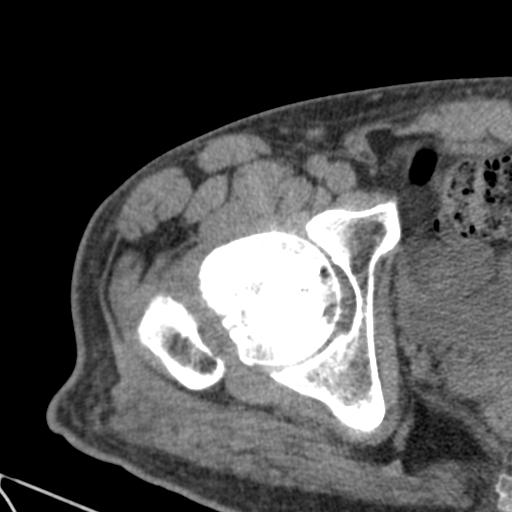
[im 40/66  bone]
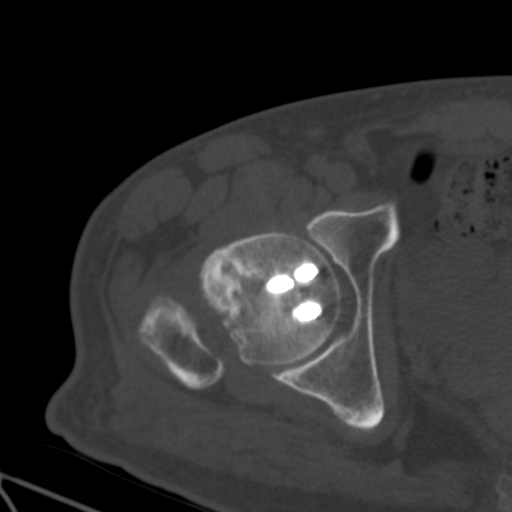
[im 45/66  soft-tissue]
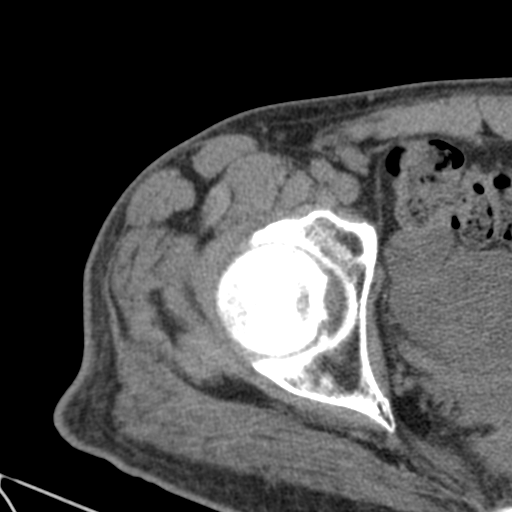
[im 49/66  soft-tissue]
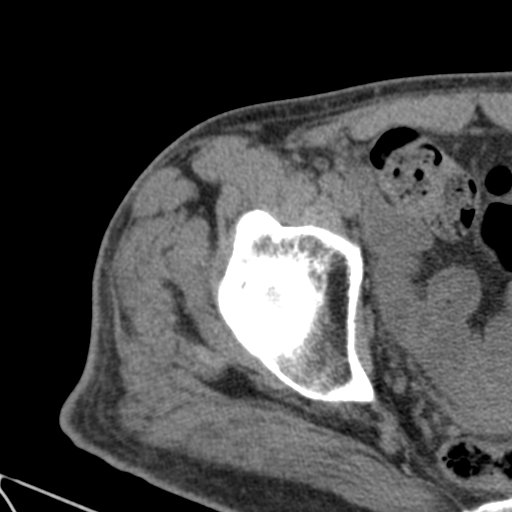
[im 53/66  soft-tissue]
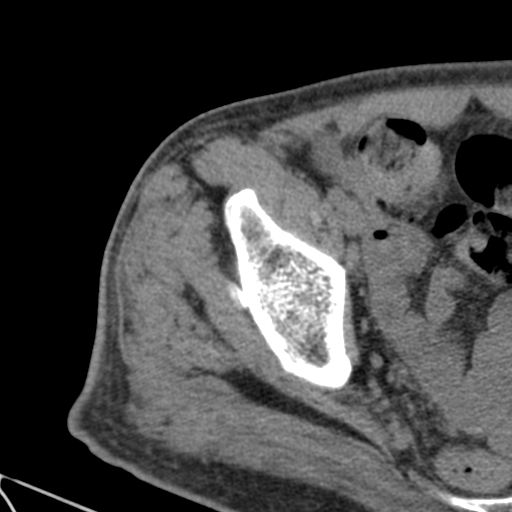
[im 57/66  soft-tissue]
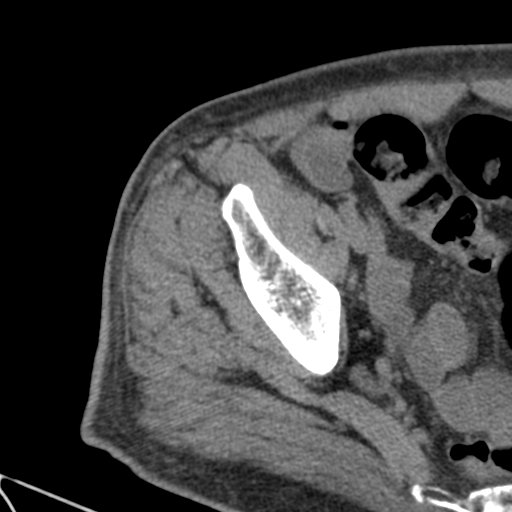
[im 57/66  lung]
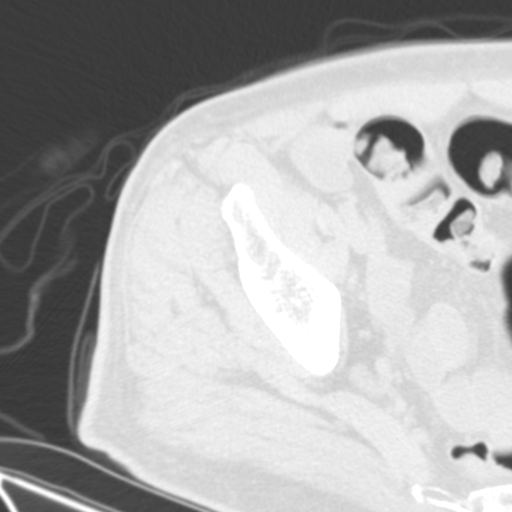
[im 59/66  lung]
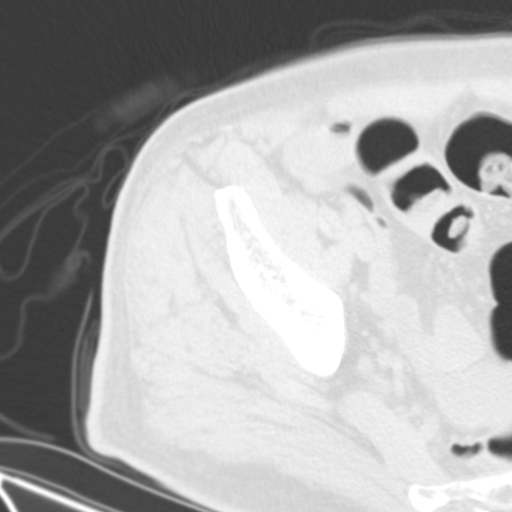
[im 61/66  soft-tissue]
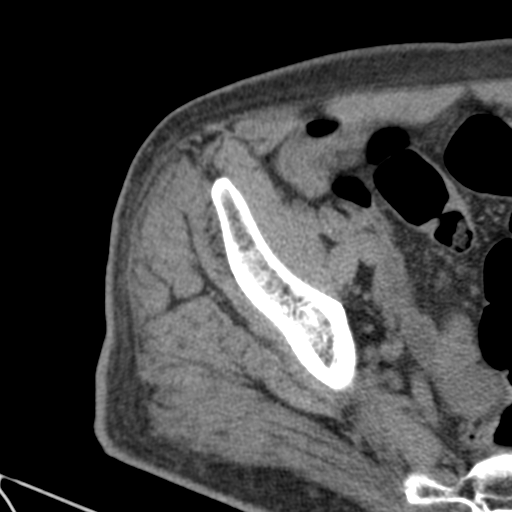
[im 61/66  lung]
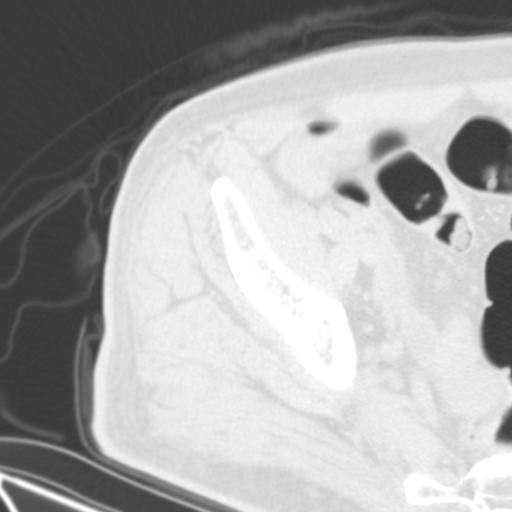
[im 63/66  lung]
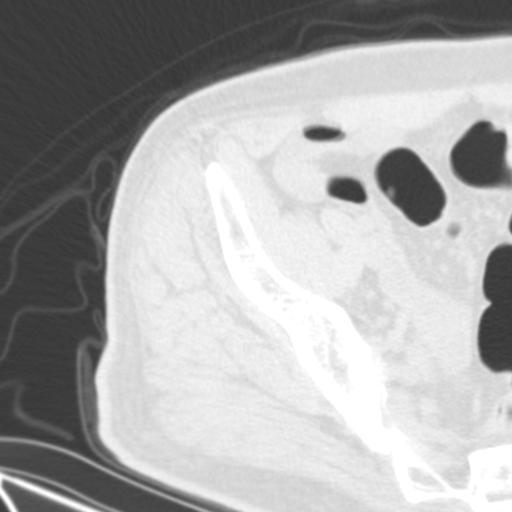

[16 of 32 positions shown; findings below may reference images not displayed]

FINDINGS: Bones/Joint/Cartilage

There is an incompletely healed subcapital fracture of right femur.
There is solid bony union at multiple levels but portions of the
fracture line are still apparent, primarily the posterior aspect of
the fracture.

The pins are in good position with no evidence of loosening.

Slight osteophyte formation on the superolateral aspect of the
acetabulum and on the femoral head. The visualized pelvic bones are
intact.

Muscles and Tendons

Negative.

Soft tissues

Negative.
IMPRESSION: Incomplete healing of the subcapital fracture of the right femur.
There are areas of solid bony fusion.
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
## 2021-09-05 ENCOUNTER — Ambulatory Visit (INDEPENDENT_AMBULATORY_CARE_PROVIDER_SITE_OTHER): Payer: Medicare Other | Admitting: Neurology

## 2021-09-05 ENCOUNTER — Encounter: Payer: Self-pay | Admitting: Neurology

## 2021-09-05 VITALS — BP 133/78 | HR 56 | Ht 68.0 in | Wt 156.0 lb

## 2021-09-05 DIAGNOSIS — R569 Unspecified convulsions: Secondary | ICD-10-CM

## 2021-09-05 MED ORDER — ZONISAMIDE 100 MG PO CAPS: 100.0000 mg | ORAL_CAPSULE | Freq: Two times a day (BID) | ORAL | 3 refills | Status: DC

## 2021-09-05 MED ORDER — ALPRAZOLAM 1 MG PO TABS: ORAL_TABLET | ORAL | 2 refills | Status: DC

## 2021-09-05 NOTE — Patient Instructions (Signed)
Will continue current medications  Call for any seizures  I will see you back in 6 months

## 2021-09-06 LAB — COMPREHENSIVE METABOLIC PANEL
ALT: 14 IU/L (ref 0–44)
AST: 17 IU/L (ref 0–40)
Albumin/Globulin Ratio: 1.9 (ref 1.2–2.2)
Albumin: 4.7 g/dL (ref 3.8–4.9)
Alkaline Phosphatase: 96 IU/L (ref 44–121)
BUN/Creatinine Ratio: 9 (ref 9–20)
BUN: 8 mg/dL (ref 6–24)
Bilirubin Total: 0.2 mg/dL (ref 0.0–1.2)
CO2: 27 mmol/L (ref 20–29)
Calcium: 9.7 mg/dL (ref 8.7–10.2)
Chloride: 102 mmol/L (ref 96–106)
Creatinine, Ser: 0.88 mg/dL (ref 0.76–1.27)
Globulin, Total: 2.5 g/dL (ref 1.5–4.5)
Glucose: 90 mg/dL (ref 70–99)
Potassium: 4.9 mmol/L (ref 3.5–5.2)
Sodium: 141 mmol/L (ref 134–144)
Total Protein: 7.2 g/dL (ref 6.0–8.5)
eGFR: 103 mL/min/{1.73_m2} (ref >59)

## 2021-09-06 LAB — CBC WITH DIFFERENTIAL/PLATELET
Basophils Absolute: 0 10*3/uL (ref 0.0–0.2)
Basos: 0 %
EOS (ABSOLUTE): 0 10*3/uL (ref 0.0–0.4)
Eos: 1 %
Hematocrit: 40.7 % (ref 37.5–51.0)
Hemoglobin: 13.4 g/dL (ref 13.0–17.7)
Immature Grans (Abs): 0 10*3/uL (ref 0.0–0.1)
Immature Granulocytes: 0 %
Lymphocytes Absolute: 1.9 10*3/uL (ref 0.7–3.1)
Lymphs: 24 %
MCH: 29.8 pg (ref 26.6–33.0)
MCHC: 32.9 g/dL (ref 31.5–35.7)
MCV: 91 fL (ref 79–97)
Monocytes Absolute: 0.4 10*3/uL (ref 0.1–0.9)
Monocytes: 5 %
Neutrophils Absolute: 5.3 10*3/uL (ref 1.4–7.0)
Neutrophils: 70 %
Platelets: 345 10*3/uL (ref 150–450)
RBC: 4.49 x10E6/uL (ref 4.14–5.80)
RDW: 12.6 % (ref 11.6–15.4)
WBC: 7.6 10*3/uL (ref 3.4–10.8)

## 2021-09-06 LAB — ZONISAMIDE LEVEL: Zonisamide: 6.4 ug/mL — ABNORMAL LOW (ref 10.0–40.0)

## 2021-09-06 LAB — PHENOBARBITAL LEVEL: Phenobarbital, Serum: 29 ug/mL (ref 15–40)

## 2021-09-11 ENCOUNTER — Telehealth: Payer: Self-pay | Admitting: *Deleted

## 2021-09-11 NOTE — Telephone Encounter (Signed)
-----   Message from [name], NP sent at 09/11/2021  5:55 AM EST ----- Please call the patient. Blood work is unremarkable. Continue current medications. Please let me know of any seizures.

## 2021-09-11 NOTE — Telephone Encounter (Signed)
I spoke to the patient and he verbalized understanding of the results. He will continue his medication and call to report any seizure activity.

## 2021-09-18 DIAGNOSIS — M5416 Radiculopathy, lumbar region: Secondary | ICD-10-CM | POA: Diagnosis not present

## 2021-09-18 DIAGNOSIS — R030 Elevated blood-pressure reading, without diagnosis of hypertension: Secondary | ICD-10-CM | POA: Diagnosis not present

## 2021-09-18 DIAGNOSIS — G8929 Other chronic pain: Secondary | ICD-10-CM | POA: Diagnosis not present

## 2021-09-18 DIAGNOSIS — M25551 Pain in right hip: Secondary | ICD-10-CM | POA: Diagnosis not present

## 2021-09-18 DIAGNOSIS — F17200 Nicotine dependence, unspecified, uncomplicated: Secondary | ICD-10-CM | POA: Diagnosis not present

## 2021-09-18 DIAGNOSIS — Z79899 Other long term (current) drug therapy: Secondary | ICD-10-CM | POA: Diagnosis not present

## 2021-09-18 DIAGNOSIS — M25572 Pain in left ankle and joints of left foot: Secondary | ICD-10-CM | POA: Diagnosis not present

## 2021-09-18 DIAGNOSIS — F17210 Nicotine dependence, cigarettes, uncomplicated: Secondary | ICD-10-CM | POA: Diagnosis not present

## 2021-09-20 DIAGNOSIS — Z79899 Other long term (current) drug therapy: Secondary | ICD-10-CM | POA: Diagnosis not present

## 2021-09-28 DIAGNOSIS — M25511 Pain in right shoulder: Secondary | ICD-10-CM | POA: Diagnosis not present

## 2021-09-28 DIAGNOSIS — M62830 Muscle spasm of back: Secondary | ICD-10-CM | POA: Diagnosis not present

## 2021-09-28 DIAGNOSIS — M25519 Pain in unspecified shoulder: Secondary | ICD-10-CM | POA: Diagnosis not present

## 2021-09-28 DIAGNOSIS — M546 Pain in thoracic spine: Secondary | ICD-10-CM | POA: Diagnosis not present

## 2021-09-28 DIAGNOSIS — M542 Cervicalgia: Secondary | ICD-10-CM | POA: Diagnosis not present

## 2021-09-29 DIAGNOSIS — M542 Cervicalgia: Secondary | ICD-10-CM | POA: Diagnosis not present

## 2021-09-29 DIAGNOSIS — M25511 Pain in right shoulder: Secondary | ICD-10-CM | POA: Diagnosis not present

## 2021-09-29 DIAGNOSIS — M546 Pain in thoracic spine: Secondary | ICD-10-CM | POA: Diagnosis not present

## 2021-09-29 DIAGNOSIS — M62830 Muscle spasm of back: Secondary | ICD-10-CM | POA: Diagnosis not present

## 2021-09-29 DIAGNOSIS — M25519 Pain in unspecified shoulder: Secondary | ICD-10-CM | POA: Diagnosis not present

## 2021-10-18 DIAGNOSIS — R030 Elevated blood-pressure reading, without diagnosis of hypertension: Secondary | ICD-10-CM | POA: Diagnosis not present

## 2021-10-18 DIAGNOSIS — F17200 Nicotine dependence, unspecified, uncomplicated: Secondary | ICD-10-CM | POA: Diagnosis not present

## 2021-10-18 DIAGNOSIS — G8929 Other chronic pain: Secondary | ICD-10-CM | POA: Diagnosis not present

## 2021-10-18 DIAGNOSIS — M5416 Radiculopathy, lumbar region: Secondary | ICD-10-CM | POA: Diagnosis not present

## 2021-10-18 DIAGNOSIS — Z79899 Other long term (current) drug therapy: Secondary | ICD-10-CM | POA: Diagnosis not present

## 2021-10-18 DIAGNOSIS — M25551 Pain in right hip: Secondary | ICD-10-CM | POA: Diagnosis not present

## 2021-10-18 DIAGNOSIS — M25572 Pain in left ankle and joints of left foot: Secondary | ICD-10-CM | POA: Diagnosis not present

## 2021-10-30 DIAGNOSIS — M25511 Pain in right shoulder: Secondary | ICD-10-CM | POA: Diagnosis not present

## 2021-10-30 DIAGNOSIS — M62830 Muscle spasm of back: Secondary | ICD-10-CM | POA: Diagnosis not present

## 2021-10-30 DIAGNOSIS — M25519 Pain in unspecified shoulder: Secondary | ICD-10-CM | POA: Diagnosis not present

## 2021-10-30 DIAGNOSIS — M542 Cervicalgia: Secondary | ICD-10-CM | POA: Diagnosis not present

## 2021-10-30 DIAGNOSIS — M546 Pain in thoracic spine: Secondary | ICD-10-CM | POA: Diagnosis not present

## 2021-11-15 DIAGNOSIS — G8929 Other chronic pain: Secondary | ICD-10-CM | POA: Diagnosis not present

## 2021-11-15 DIAGNOSIS — R030 Elevated blood-pressure reading, without diagnosis of hypertension: Secondary | ICD-10-CM | POA: Diagnosis not present

## 2021-11-15 DIAGNOSIS — M25572 Pain in left ankle and joints of left foot: Secondary | ICD-10-CM | POA: Diagnosis not present

## 2021-11-15 DIAGNOSIS — F17200 Nicotine dependence, unspecified, uncomplicated: Secondary | ICD-10-CM | POA: Diagnosis not present

## 2021-11-15 DIAGNOSIS — Z79899 Other long term (current) drug therapy: Secondary | ICD-10-CM | POA: Diagnosis not present

## 2021-11-15 DIAGNOSIS — M5416 Radiculopathy, lumbar region: Secondary | ICD-10-CM | POA: Diagnosis not present

## 2021-11-15 DIAGNOSIS — M25551 Pain in right hip: Secondary | ICD-10-CM | POA: Diagnosis not present

## 2021-11-19 DIAGNOSIS — Z79899 Other long term (current) drug therapy: Secondary | ICD-10-CM | POA: Diagnosis not present

## 2021-11-27 DIAGNOSIS — M62830 Muscle spasm of back: Secondary | ICD-10-CM | POA: Diagnosis not present

## 2021-11-27 DIAGNOSIS — M542 Cervicalgia: Secondary | ICD-10-CM | POA: Diagnosis not present

## 2021-11-27 DIAGNOSIS — M25519 Pain in unspecified shoulder: Secondary | ICD-10-CM | POA: Diagnosis not present

## 2021-11-27 DIAGNOSIS — M546 Pain in thoracic spine: Secondary | ICD-10-CM | POA: Diagnosis not present

## 2021-11-27 DIAGNOSIS — M25511 Pain in right shoulder: Secondary | ICD-10-CM | POA: Diagnosis not present

## 2021-12-03 ENCOUNTER — Encounter: Payer: Self-pay | Admitting: Neurology

## 2021-12-03 NOTE — Telephone Encounter (Signed)
Error

## 2021-12-06 DIAGNOSIS — G43809 Other migraine, not intractable, without status migrainosus: Secondary | ICD-10-CM | POA: Diagnosis not present

## 2021-12-12 ENCOUNTER — Other Ambulatory Visit: Payer: Self-pay | Admitting: Neurology

## 2021-12-13 ENCOUNTER — Other Ambulatory Visit: Payer: Self-pay

## 2021-12-14 DIAGNOSIS — Z79899 Other long term (current) drug therapy: Secondary | ICD-10-CM | POA: Diagnosis not present

## 2021-12-14 DIAGNOSIS — M62830 Muscle spasm of back: Secondary | ICD-10-CM | POA: Diagnosis not present

## 2021-12-14 DIAGNOSIS — G629 Polyneuropathy, unspecified: Secondary | ICD-10-CM | POA: Diagnosis not present

## 2021-12-14 DIAGNOSIS — M5416 Radiculopathy, lumbar region: Secondary | ICD-10-CM | POA: Diagnosis not present

## 2021-12-14 DIAGNOSIS — F17200 Nicotine dependence, unspecified, uncomplicated: Secondary | ICD-10-CM | POA: Diagnosis not present

## 2021-12-14 DIAGNOSIS — R030 Elevated blood-pressure reading, without diagnosis of hypertension: Secondary | ICD-10-CM | POA: Diagnosis not present

## 2021-12-17 DIAGNOSIS — M542 Cervicalgia: Secondary | ICD-10-CM | POA: Diagnosis not present

## 2021-12-20 ENCOUNTER — Other Ambulatory Visit: Payer: Self-pay | Admitting: *Deleted

## 2021-12-20 DIAGNOSIS — Z79899 Other long term (current) drug therapy: Secondary | ICD-10-CM | POA: Diagnosis not present

## 2021-12-20 MED ORDER — PHENOBARBITAL 64.8 MG PO TABS: 194.4000 mg | ORAL_TABLET | Freq: Every day | ORAL | 1 refills | Status: DC

## 2021-12-26 ENCOUNTER — Encounter: Payer: Self-pay | Admitting: *Deleted

## 2021-12-26 NOTE — Progress Notes (Addendum)
? ?Referring:  ?Riki Sheer, NP ?[place] ?[place],   [id number] ? ?PCP: ?Riki Sheer, NP ? ?Neurology was asked to evaluate Richard Glass for a chief complaint of headaches.  Our recommendations of care will be communicated by shared medical record.   ? ?CC:  headaches ? ?History provided from self ? ?HPI:  ?Medical co-morbidities: TBI, seizures ? ?The patient presents for evaluation of daily headaches which began 2 months ago. He denies a history of headaches prior to this. No clear trigger for headache onset. Headache is described as bitemporal throbbing with associated photophobia and nausea. It is constant, but fluctuates in severity. He was given a sample of Nurtec from his PCP which helps his headache a little bit but does not resolve it. Tried Tylenol which does not help. ? 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 Currently takes zonisamide and phenobarbital for seizures. ? ?Headache History: ?Onset: 2 months ago ?Triggers: heat ?Aura: no ?Location: bilateral temples ?Quality/Description: throbbing ?Associated Symptoms: ? Photophobia: yes ? Phonophobia: no ? Nausea: yes ?Vomiting: no ?Worse with activity?: yes ?Duration of headaches: constant ? ? ?Headache days per month: 30 ?Headache free days per month: 0 ? ?Current Treatment: ?Abortive ?Tylenol ? ?Preventative ?none ? ?Prior Therapies                                 ?Nurtec ?Gabapentin 1200/600/1200 ?Lyrica 150 mg TID ?Cymbalta 60 mg daily ?Elavil 10 mg QHS ?Propranolol 40 mg daily - hypotension ?Topamax - weight loss ?Zonisamide ?Depakote - tremors ? ? ?LABS: ?CBC ?   ?Component Value Date/Time  ? WBC 7.6 09/05/2021 1127  ? WBC 8.8 05/01/2021 1532  ? RBC 4.49 09/05/2021 1127  ? RBC 4.30 05/01/2021 1532  ? HGB 13.4 09/05/2021 1127  ? HCT 40.7 09/05/2021 1127  ? PLT 345 09/05/2021 1127  ? MCV 91 09/05/2021 1127  ? MCH 29.8 09/05/2021 1127  ? MCH 30.5 05/01/2021 1532  ? MCHC 32.9 09/05/2021 1127  ? MCHC 32.3 05/01/2021  1532  ? RDW 12.6 09/05/2021 1127  ? LYMPHSABS 1.9 09/05/2021 1127  ? MONOABS 0.5 05/01/2021 1532  ? EOSABS 0.0 09/05/2021 1127  ? BASOSABS 0.0 09/05/2021 1127  ? ? ?  Latest Ref Rng & Units 09/05/2021  ? 11:27 AM 05/01/2021  ?  3:32 PM 10/18/2020  ? 10:44 AM  ?CMP  ?Glucose 70 - 99 mg/dL 90   111   64    ?BUN 6 - 24 mg/dL 8   <5   9    ?Creatinine 0.76 - 1.27 mg/dL 0.88   0.98   0.92    ?Sodium 134 - 144 mmol/L 141   137   142    ?Potassium 3.5 - 5.2 mmol/L 4.9   3.0   4.5    ?Chloride 96 - 106 mmol/L 102   102   101    ?CO2 20 - 29 mmol/L '27   25   27    '$ ?Calcium 8.7 - 10.2 mg/dL 9.7   8.7   9.8    ?Total Protein 6.0 - 8.5 g/dL 7.2   6.6   6.6    ?Total Bilirubin 0.0 - 1.2 mg/dL 0.2   0.5   0.2    ?Alkaline Phos 44 - 121 IU/L 96   66   88    ?AST 0 - 40 IU/L 17   18  12    ?ALT 0 - 44 IU/L '14   15   10    '$ ? ? ? ?IMAGING:  ?MRI C-spine 05/08/2021:  ? ?1. Limited degenerative change similar to a 08/30/2020 CT. ?2. Mild or moderate left foraminal narrowing at C3-4. ?3. Widely patent cervical spinal canal. ? ?CTH 08/30/20: ?2. 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 ?3. Redemonstrated right sided burr hole with possible subtle chronic ?encephalomalacia within the underlying right frontal lobe ?  ?Imaging independently reviewed on December 27, 2021  ? ?Current Outpatient Medications on File Prior to Visit  ?Medication Sig Dispense Refill  ? ALPRAZolam (XANAX) 1 MG tablet TAKE 1/2 TABLET 3 TIMES DAILY AS NEEDED FOR TREMOR 45 tablet 2  ? Calcium Carb-Cholecalciferol ([employer] CALCIUM + D3) 600-800 MG-UNIT TABS Take 1 tablet by mouth 2 (two) times daily before a meal.    ? cetirizine (ZYRTEC) 10 MG tablet Take 10 mg by mouth daily.    ? diclofenac Sodium (VOLTAREN) 1 % GEL Apply 2 g topically 4 (four) times daily. 150 g 0  ? fluticasone (FLONASE) 50 MCG/ACT nasal spray PLACE 2 SPRAYS IN EACH NOSTRIL EVERY DAY (Patient taking  differently: Place 2 sprays into both nostrils daily as needed for allergies.) 48 mL 3  ? gabapentin (NEURONTIN) 600 MG tablet Take 2 tablets (1,200 mg total) by mouth 3 (three) times daily. (Patient taking differently: Take by mouth. Takes 1 tablet in the morning and 2 tablets at bedtime.) 360 tablet 3  ? hydrOXYzine (ATARAX/VISTARIL) 25 MG tablet Take 25 mg by mouth 3 (three) times daily as needed.    ? methocarbamol (ROBAXIN) 500 MG tablet Take 2 tablets (1,000 mg total) by mouth 2 (two) times daily. 20 tablet 0  ? Multiple Vitamin (MULTIVITAMIN) tablet Take 1 tablet by mouth daily.    ? Multiple Vitamins-Minerals (CENTRUM SILVER PO) Take 1 each by mouth daily.    ? oxyCODONE-acetaminophen (PERCOCET) 10-325 MG tablet Take 1 tablet by mouth every 4 (four) hours as needed for severe pain.    ? PHENobarbital (LUMINAL) 64.8 MG tablet Take 3 tablets (194.4 mg total) by mouth at bedtime. 270 tablet 1  ? potassium chloride 20 MEQ TBCR Take 20 mEq by mouth daily for 3 days. 3 tablet 0  ? zonisamide (ZONEGRAN) 100 MG capsule Take 1 capsule (100 mg total) by mouth 2 (two) times daily. 180 capsule 3  ? ?No current facility-administered medications on file prior to visit.  ? ? ? ?Allergies: ?Allergies  ?Allergen Reactions  ? Ibuprofen Diarrhea  ?  Per patient also burns stomach ?  ? No Known Allergies   ? ? ?Family History: ?Does not know family history ? ?Past Medical History: ?Past Medical History:  ?Diagnosis Date  ? Abnormal head CT 05/2011  ? 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  ? Allergic rhinitis 11/13/2006  ?     ? Anxiety   ? Anxiety state 08/08/2013  ? Asthma   ? ASTHMA, INTERMITTENT 07/08/2010  ? Qualifier: Diagnosis of  By: Walker Kehr MD, Patrick Jupiter    ? Back pain of lumbar region with sciatica 11/13/2006  ? MRI 12/2014 - lumbar DDD without focal neural impingement  MRI Lumbar 10/30/16 ([place]) Small central L5-S1 disc extrusion with minimal cranial migration and  associated annular fissure approaches descending left S1 nerve roots without contact or displacement. Minimal bulging disc height loss without spinal  canal stenosis or neural foraminal narrowing.  Minimal bulging disc at L2-L3 through L4-L5 without spinal canal stenosis or neural foraminal narrowing.  MRI Sacrum 10/30/16 ([place]) No fracture. Small lesion left sacral ala most likely a subchondral cyst/erosion adjacent to the sacroiliac joint.     ? 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 Left Proximal Fibula (MRI September 2017)  ? Carpal tunnel syndrome 01/09/2015  ? Left 12/2014   ? Cervical spine pain 02/06/2015  ? MRI 03/2015 FINDINGS: Vertebral body height, signal and alignment are normal. The craniocervical junction is normal and cervical cord signal is normal. The central spinal canal and neural foramina are widely patent at all levels. Scattered, mild degenerative change appears most notable at C4-5. Imaged paraspinous structures are unremarkable.   IMPRESSION: Negative for central canal or foraminal narrowing. No finding to explain the patient's symptoms. Scattered, mild facet degenerative disease is noted.   ? Cervicogenic migraine   ? Chronic low back pain   ? Chronic pain 01/18/2016  ? Chronic pain syndrome 01/18/2016  ? Coccyx pain 02/06/2015  ? Contact dermatitis or eczema 02/13/2018  ? Convulsions ([place]) 11/13/2006  ?   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   ? Degenerative arthritis   ? Dyslipidemia   ? External hemorrhoid, bleeding 06/04/2011  ? GASTROESOPHAGEAL REFLUX, NO ESOPHAGITIS 11/13/2006  ? Qualifier: Diagnosis of  By: Eusebio Friendly    ? Hamstring muscle strain, left, initial encounter 06/26/2018  ? Headache(784.0)   ? Hyperlipidemia 11/13/2006  ? 07/2016  ASCVD score of 4.7% - recommended lifestyle changes   ? Insomnia 02/02/2013  ? Intention tremor 10/31/2009  ? Qualifier: Diagnosis of  By: Walker Kehr MD, Patrick Jupiter    ? Intercostal pain 04/25/2015  ? Left  foot pain 08/21/2018  ? Left knee injury 05/09/2016  ? 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 Osteopenia 10/10/2016  ? Overweight(278.02) 06/13/2009  ? Qualifier: Diagnosi

## 2021-12-27 ENCOUNTER — Telehealth: Payer: Self-pay | Admitting: Psychiatry

## 2021-12-27 ENCOUNTER — Ambulatory Visit (INDEPENDENT_AMBULATORY_CARE_PROVIDER_SITE_OTHER): Payer: Medicare Other | Admitting: Psychiatry

## 2021-12-27 ENCOUNTER — Telehealth: Payer: Self-pay | Admitting: *Deleted

## 2021-12-27 ENCOUNTER — Encounter: Payer: Self-pay | Admitting: Psychiatry

## 2021-12-27 VITALS — BP 123/77 | HR 62 | Ht 68.0 in | Wt 167.8 lb

## 2021-12-27 DIAGNOSIS — G43019 Migraine without aura, intractable, without status migrainosus: Secondary | ICD-10-CM

## 2021-12-27 DIAGNOSIS — R519 Headache, unspecified: Secondary | ICD-10-CM

## 2021-12-27 MED ORDER — RIZATRIPTAN BENZOATE 10 MG PO TABS: 10.0000 mg | ORAL_TABLET | ORAL | 3 refills | Status: DC | PRN

## 2021-12-27 MED ORDER — EMGALITY 120 MG/ML ~~LOC~~ SOAJ: 1.0000 "pen " | SUBCUTANEOUS | 3 refills | Status: DC

## 2021-12-27 NOTE — Telephone Encounter (Signed)
[employer] medicare order sent to [facility], [facility] they will reach out to the patient to schedule.  

## 2021-12-27 NOTE — Telephone Encounter (Signed)
EMGALITY INJ '120MG'$ /ML, use as directed, is approved through 09/15/2022 under your Medicare Part D benefit. Faxed approval letter to pharmacy. [employer] call [phone]. ?

## 2021-12-27 NOTE — Patient Instructions (Signed)
MRI brain ?Start Emgality for migraine prevention. First dose is 2 injections, then each dose after that is 1 injection per month ?Start rizatriptan as needed for migraines. Take at the onset of migraine. If headache recurs or does not fully resolve, you may take a second dose after 2 hours. Please avoid taking more than 2 days per week ?

## 2021-12-27 NOTE — Telephone Encounter (Signed)
Emgality PA, key [id number]. [employer] is reviewing your PA request. Typically an electronic response will be received within 24-72 hours.  ?

## 2021-12-28 DIAGNOSIS — M25519 Pain in unspecified shoulder: Secondary | ICD-10-CM | POA: Diagnosis not present

## 2021-12-28 DIAGNOSIS — M542 Cervicalgia: Secondary | ICD-10-CM | POA: Diagnosis not present

## 2021-12-28 DIAGNOSIS — M546 Pain in thoracic spine: Secondary | ICD-10-CM | POA: Diagnosis not present

## 2021-12-28 DIAGNOSIS — M62830 Muscle spasm of back: Secondary | ICD-10-CM | POA: Diagnosis not present

## 2021-12-28 DIAGNOSIS — M25511 Pain in right shoulder: Secondary | ICD-10-CM | POA: Diagnosis not present

## 2022-01-01 ENCOUNTER — Other Ambulatory Visit: Payer: Self-pay | Admitting: Psychiatry

## 2022-01-01 DIAGNOSIS — R519 Headache, unspecified: Secondary | ICD-10-CM

## 2022-01-07 DIAGNOSIS — M542 Cervicalgia: Secondary | ICD-10-CM | POA: Diagnosis not present

## 2022-01-09 DIAGNOSIS — F17200 Nicotine dependence, unspecified, uncomplicated: Secondary | ICD-10-CM | POA: Diagnosis not present

## 2022-01-09 DIAGNOSIS — Z79899 Other long term (current) drug therapy: Secondary | ICD-10-CM | POA: Diagnosis not present

## 2022-01-09 DIAGNOSIS — M25572 Pain in left ankle and joints of left foot: Secondary | ICD-10-CM | POA: Diagnosis not present

## 2022-01-09 DIAGNOSIS — M5416 Radiculopathy, lumbar region: Secondary | ICD-10-CM | POA: Diagnosis not present

## 2022-01-09 DIAGNOSIS — G8929 Other chronic pain: Secondary | ICD-10-CM | POA: Diagnosis not present

## 2022-01-09 DIAGNOSIS — M25551 Pain in right hip: Secondary | ICD-10-CM | POA: Diagnosis not present

## 2022-01-14 DIAGNOSIS — M542 Cervicalgia: Secondary | ICD-10-CM | POA: Diagnosis not present

## 2022-01-21 DIAGNOSIS — M542 Cervicalgia: Secondary | ICD-10-CM | POA: Diagnosis not present

## 2022-01-27 DIAGNOSIS — M546 Pain in thoracic spine: Secondary | ICD-10-CM | POA: Diagnosis not present

## 2022-01-27 DIAGNOSIS — M542 Cervicalgia: Secondary | ICD-10-CM | POA: Diagnosis not present

## 2022-01-27 DIAGNOSIS — M62830 Muscle spasm of back: Secondary | ICD-10-CM | POA: Diagnosis not present

## 2022-01-27 DIAGNOSIS — M25511 Pain in right shoulder: Secondary | ICD-10-CM | POA: Diagnosis not present

## 2022-01-27 DIAGNOSIS — M25519 Pain in unspecified shoulder: Secondary | ICD-10-CM | POA: Diagnosis not present

## 2022-02-06 DIAGNOSIS — F17210 Nicotine dependence, cigarettes, uncomplicated: Secondary | ICD-10-CM | POA: Diagnosis not present

## 2022-02-06 DIAGNOSIS — M25572 Pain in left ankle and joints of left foot: Secondary | ICD-10-CM | POA: Diagnosis not present

## 2022-02-06 DIAGNOSIS — M5416 Radiculopathy, lumbar region: Secondary | ICD-10-CM | POA: Diagnosis not present

## 2022-02-06 DIAGNOSIS — M25551 Pain in right hip: Secondary | ICD-10-CM | POA: Diagnosis not present

## 2022-02-06 DIAGNOSIS — G8929 Other chronic pain: Secondary | ICD-10-CM | POA: Diagnosis not present

## 2022-02-06 DIAGNOSIS — Z79899 Other long term (current) drug therapy: Secondary | ICD-10-CM | POA: Diagnosis not present

## 2022-02-06 DIAGNOSIS — Z0000 Encounter for general adult medical examination without abnormal findings: Secondary | ICD-10-CM | POA: Diagnosis not present

## 2022-02-08 DIAGNOSIS — Z79899 Other long term (current) drug therapy: Secondary | ICD-10-CM | POA: Diagnosis not present

## 2022-02-27 DIAGNOSIS — M25519 Pain in unspecified shoulder: Secondary | ICD-10-CM | POA: Diagnosis not present

## 2022-02-27 DIAGNOSIS — M62830 Muscle spasm of back: Secondary | ICD-10-CM | POA: Diagnosis not present

## 2022-02-27 DIAGNOSIS — M546 Pain in thoracic spine: Secondary | ICD-10-CM | POA: Diagnosis not present

## 2022-02-27 DIAGNOSIS — M542 Cervicalgia: Secondary | ICD-10-CM | POA: Diagnosis not present

## 2022-02-27 DIAGNOSIS — M25511 Pain in right shoulder: Secondary | ICD-10-CM | POA: Diagnosis not present

## 2022-03-06 DIAGNOSIS — M5416 Radiculopathy, lumbar region: Secondary | ICD-10-CM | POA: Diagnosis not present

## 2022-03-06 DIAGNOSIS — F17210 Nicotine dependence, cigarettes, uncomplicated: Secondary | ICD-10-CM | POA: Diagnosis not present

## 2022-03-06 DIAGNOSIS — M25572 Pain in left ankle and joints of left foot: Secondary | ICD-10-CM | POA: Diagnosis not present

## 2022-03-06 DIAGNOSIS — M25551 Pain in right hip: Secondary | ICD-10-CM | POA: Diagnosis not present

## 2022-03-06 DIAGNOSIS — G8929 Other chronic pain: Secondary | ICD-10-CM | POA: Diagnosis not present

## 2022-03-06 DIAGNOSIS — Z79899 Other long term (current) drug therapy: Secondary | ICD-10-CM | POA: Diagnosis not present

## 2022-03-08 DIAGNOSIS — Z79899 Other long term (current) drug therapy: Secondary | ICD-10-CM | POA: Diagnosis not present
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
## 2022-03-14 ENCOUNTER — Telehealth: Payer: Self-pay | Admitting: Neurology

## 2022-03-14 ENCOUNTER — Ambulatory Visit (INDEPENDENT_AMBULATORY_CARE_PROVIDER_SITE_OTHER): Payer: Medicare Other | Admitting: Neurology

## 2022-03-14 VITALS — BP 120/77 | HR 60 | Ht 67.0 in | Wt 166.1 lb

## 2022-03-14 DIAGNOSIS — G894 Chronic pain syndrome: Secondary | ICD-10-CM | POA: Diagnosis not present

## 2022-03-14 DIAGNOSIS — R251 Tremor, unspecified: Secondary | ICD-10-CM

## 2022-03-14 DIAGNOSIS — G43709 Chronic migraine without aura, not intractable, without status migrainosus: Secondary | ICD-10-CM | POA: Insufficient documentation

## 2022-03-14 DIAGNOSIS — R569 Unspecified convulsions: Secondary | ICD-10-CM | POA: Diagnosis not present

## 2022-03-14 MED ORDER — ALPRAZOLAM 1 MG PO TABS: ORAL_TABLET | ORAL | 0 refills | Status: DC

## 2022-03-14 MED ORDER — EMGALITY 120 MG/ML ~~LOC~~ SOAJ: 1.0000 "pen " | SUBCUTANEOUS | 11 refills | Status: DC

## 2022-03-14 NOTE — Telephone Encounter (Signed)
Patient never had MRI brain done in April, can we get this set up? Thanks

## 2022-03-14 NOTE — Telephone Encounter (Signed)
Sent order to GI they will call the patient to schedule

## 2022-03-14 NOTE — Patient Instructions (Addendum)
Continue current medications, discuss the xanax with your primary care doctor going forward  Will check on getting your MRI completed See you back in 6 months

## 2022-03-29 DIAGNOSIS — M542 Cervicalgia: Secondary | ICD-10-CM | POA: Diagnosis not present

## 2022-03-29 DIAGNOSIS — M546 Pain in thoracic spine: Secondary | ICD-10-CM | POA: Diagnosis not present

## 2022-03-29 DIAGNOSIS — M62830 Muscle spasm of back: Secondary | ICD-10-CM | POA: Diagnosis not present

## 2022-03-29 DIAGNOSIS — M25511 Pain in right shoulder: Secondary | ICD-10-CM | POA: Diagnosis not present

## 2022-03-29 DIAGNOSIS — M25519 Pain in unspecified shoulder: Secondary | ICD-10-CM | POA: Diagnosis not present

## 2022-04-04 DIAGNOSIS — W19XXXA Unspecified fall, initial encounter: Secondary | ICD-10-CM | POA: Diagnosis not present

## 2022-04-04 DIAGNOSIS — M25562 Pain in left knee: Secondary | ICD-10-CM | POA: Diagnosis not present

## 2022-04-04 DIAGNOSIS — G629 Polyneuropathy, unspecified: Secondary | ICD-10-CM | POA: Diagnosis not present

## 2022-04-04 DIAGNOSIS — F17210 Nicotine dependence, cigarettes, uncomplicated: Secondary | ICD-10-CM | POA: Diagnosis not present

## 2022-04-04 DIAGNOSIS — M62830 Muscle spasm of back: Secondary | ICD-10-CM | POA: Diagnosis not present

## 2022-04-04 DIAGNOSIS — M5416 Radiculopathy, lumbar region: Secondary | ICD-10-CM | POA: Diagnosis not present

## 2022-04-04 DIAGNOSIS — M25572 Pain in left ankle and joints of left foot: Secondary | ICD-10-CM | POA: Diagnosis not present

## 2022-04-04 DIAGNOSIS — M25552 Pain in left hip: Secondary | ICD-10-CM | POA: Diagnosis not present

## 2022-04-04 DIAGNOSIS — Z79899 Other long term (current) drug therapy: Secondary | ICD-10-CM | POA: Diagnosis not present

## 2022-04-04 DIAGNOSIS — M25551 Pain in right hip: Secondary | ICD-10-CM | POA: Diagnosis not present

## 2022-04-04 DIAGNOSIS — G8929 Other chronic pain: Secondary | ICD-10-CM | POA: Diagnosis not present

## 2022-04-16 DIAGNOSIS — R143 Flatulence: Secondary | ICD-10-CM | POA: Diagnosis not present

## 2022-04-16 DIAGNOSIS — R142 Eructation: Secondary | ICD-10-CM | POA: Diagnosis not present

## 2022-04-26 DIAGNOSIS — R569 Unspecified convulsions: Secondary | ICD-10-CM | POA: Diagnosis not present

## 2022-04-26 DIAGNOSIS — J449 Chronic obstructive pulmonary disease, unspecified: Secondary | ICD-10-CM | POA: Diagnosis not present

## 2022-04-26 DIAGNOSIS — E7800 Pure hypercholesterolemia, unspecified: Secondary | ICD-10-CM | POA: Diagnosis not present

## 2022-04-26 DIAGNOSIS — R5383 Other fatigue: Secondary | ICD-10-CM | POA: Diagnosis not present

## 2022-04-26 DIAGNOSIS — Z79899 Other long term (current) drug therapy: Secondary | ICD-10-CM | POA: Diagnosis not present

## 2022-04-26 DIAGNOSIS — L299 Pruritus, unspecified: Secondary | ICD-10-CM | POA: Diagnosis not present

## 2022-04-26 DIAGNOSIS — J439 Emphysema, unspecified: Secondary | ICD-10-CM | POA: Diagnosis not present

## 2022-04-29 DIAGNOSIS — M62830 Muscle spasm of back: Secondary | ICD-10-CM | POA: Diagnosis not present

## 2022-04-29 DIAGNOSIS — M546 Pain in thoracic spine: Secondary | ICD-10-CM | POA: Diagnosis not present

## 2022-04-29 DIAGNOSIS — M25511 Pain in right shoulder: Secondary | ICD-10-CM | POA: Diagnosis not present

## 2022-04-29 DIAGNOSIS — M25519 Pain in unspecified shoulder: Secondary | ICD-10-CM | POA: Diagnosis not present

## 2022-04-29 DIAGNOSIS — M542 Cervicalgia: Secondary | ICD-10-CM | POA: Diagnosis not present

## 2022-05-06 DIAGNOSIS — K5909 Other constipation: Secondary | ICD-10-CM | POA: Diagnosis not present

## 2022-05-06 DIAGNOSIS — G629 Polyneuropathy, unspecified: Secondary | ICD-10-CM | POA: Diagnosis not present

## 2022-05-06 DIAGNOSIS — F17210 Nicotine dependence, cigarettes, uncomplicated: Secondary | ICD-10-CM | POA: Diagnosis not present

## 2022-05-06 DIAGNOSIS — M5416 Radiculopathy, lumbar region: Secondary | ICD-10-CM | POA: Diagnosis not present

## 2022-05-06 DIAGNOSIS — M62830 Muscle spasm of back: Secondary | ICD-10-CM | POA: Diagnosis not present

## 2022-05-06 DIAGNOSIS — Z79899 Other long term (current) drug therapy: Secondary | ICD-10-CM | POA: Diagnosis not present

## 2022-05-06 DIAGNOSIS — R1084 Generalized abdominal pain: Secondary | ICD-10-CM | POA: Diagnosis not present

## 2022-05-07 ENCOUNTER — Other Ambulatory Visit: Payer: Self-pay | Admitting: Psychiatry

## 2022-05-10 DIAGNOSIS — Z79899 Other long term (current) drug therapy: Secondary | ICD-10-CM | POA: Diagnosis not present

## 2022-05-27 DIAGNOSIS — R5383 Other fatigue: Secondary | ICD-10-CM | POA: Diagnosis not present

## 2022-05-27 DIAGNOSIS — I10 Essential (primary) hypertension: Secondary | ICD-10-CM | POA: Diagnosis not present

## 2022-05-27 DIAGNOSIS — J449 Chronic obstructive pulmonary disease, unspecified: Secondary | ICD-10-CM | POA: Diagnosis not present

## 2022-05-27 DIAGNOSIS — L299 Pruritus, unspecified: Secondary | ICD-10-CM | POA: Diagnosis not present

## 2022-05-27 DIAGNOSIS — Z23 Encounter for immunization: Secondary | ICD-10-CM | POA: Diagnosis not present

## 2022-05-27 DIAGNOSIS — Z79899 Other long term (current) drug therapy: Secondary | ICD-10-CM | POA: Diagnosis not present

## 2022-05-27 DIAGNOSIS — E7800 Pure hypercholesterolemia, unspecified: Secondary | ICD-10-CM | POA: Diagnosis not present

## 2022-05-27 DIAGNOSIS — Z131 Encounter for screening for diabetes mellitus: Secondary | ICD-10-CM | POA: Diagnosis not present

## 2022-05-27 DIAGNOSIS — R569 Unspecified convulsions: Secondary | ICD-10-CM | POA: Diagnosis not present

## 2022-05-28 DIAGNOSIS — R1319 Other dysphagia: Secondary | ICD-10-CM | POA: Diagnosis not present

## 2022-05-30 DIAGNOSIS — M542 Cervicalgia: Secondary | ICD-10-CM | POA: Diagnosis not present

## 2022-05-30 DIAGNOSIS — M62830 Muscle spasm of back: Secondary | ICD-10-CM | POA: Diagnosis not present

## 2022-05-30 DIAGNOSIS — M546 Pain in thoracic spine: Secondary | ICD-10-CM | POA: Diagnosis not present

## 2022-05-30 DIAGNOSIS — M25519 Pain in unspecified shoulder: Secondary | ICD-10-CM | POA: Diagnosis not present

## 2022-05-30 DIAGNOSIS — M25511 Pain in right shoulder: Secondary | ICD-10-CM | POA: Diagnosis not present

## 2022-06-05 DIAGNOSIS — J439 Emphysema, unspecified: Secondary | ICD-10-CM | POA: Diagnosis not present

## 2022-06-05 DIAGNOSIS — Z79899 Other long term (current) drug therapy: Secondary | ICD-10-CM | POA: Diagnosis not present

## 2022-06-05 DIAGNOSIS — M25552 Pain in left hip: Secondary | ICD-10-CM | POA: Diagnosis not present

## 2022-06-05 DIAGNOSIS — M25572 Pain in left ankle and joints of left foot: Secondary | ICD-10-CM | POA: Diagnosis not present

## 2022-06-05 DIAGNOSIS — M5416 Radiculopathy, lumbar region: Secondary | ICD-10-CM | POA: Diagnosis not present

## 2022-06-05 DIAGNOSIS — M62830 Muscle spasm of back: Secondary | ICD-10-CM | POA: Diagnosis not present

## 2022-06-05 DIAGNOSIS — G629 Polyneuropathy, unspecified: Secondary | ICD-10-CM | POA: Diagnosis not present

## 2022-06-05 DIAGNOSIS — M25551 Pain in right hip: Secondary | ICD-10-CM | POA: Diagnosis not present

## 2022-06-05 DIAGNOSIS — F17210 Nicotine dependence, cigarettes, uncomplicated: Secondary | ICD-10-CM | POA: Diagnosis not present

## 2022-06-05 DIAGNOSIS — G8929 Other chronic pain: Secondary | ICD-10-CM | POA: Diagnosis not present

## 2022-06-07 DIAGNOSIS — Z79899 Other long term (current) drug therapy: Secondary | ICD-10-CM | POA: Diagnosis not present

## 2022-06-25 ENCOUNTER — Other Ambulatory Visit: Payer: Self-pay | Admitting: Neurology

## 2022-06-25 NOTE — Telephone Encounter (Signed)
Verify Drug Registry For Phenobarbital 64.8 Mg Tablet Last Filled: 04/23/2022 Quantity: 180 tablets for 60 days Last appointment: 03/14/2022 Next appointment: 10/02/2022

## 2022-06-27 DIAGNOSIS — Z1211 Encounter for screening for malignant neoplasm of colon: Secondary | ICD-10-CM | POA: Diagnosis not present

## 2022-06-27 DIAGNOSIS — K200 Eosinophilic esophagitis: Secondary | ICD-10-CM | POA: Diagnosis not present

## 2022-06-27 DIAGNOSIS — R1319 Other dysphagia: Secondary | ICD-10-CM | POA: Diagnosis not present

## 2022-06-29 DIAGNOSIS — M25511 Pain in right shoulder: Secondary | ICD-10-CM | POA: Diagnosis not present

## 2022-06-29 DIAGNOSIS — M62830 Muscle spasm of back: Secondary | ICD-10-CM | POA: Diagnosis not present

## 2022-06-29 DIAGNOSIS — M546 Pain in thoracic spine: Secondary | ICD-10-CM | POA: Diagnosis not present

## 2022-06-29 DIAGNOSIS — M542 Cervicalgia: Secondary | ICD-10-CM | POA: Diagnosis not present

## 2022-06-29 DIAGNOSIS — M25519 Pain in unspecified shoulder: Secondary | ICD-10-CM | POA: Diagnosis not present

## 2022-07-04 DIAGNOSIS — M25551 Pain in right hip: Secondary | ICD-10-CM | POA: Diagnosis not present

## 2022-07-04 DIAGNOSIS — M62830 Muscle spasm of back: Secondary | ICD-10-CM | POA: Diagnosis not present

## 2022-07-04 DIAGNOSIS — G629 Polyneuropathy, unspecified: Secondary | ICD-10-CM | POA: Diagnosis not present

## 2022-07-04 DIAGNOSIS — Z79899 Other long term (current) drug therapy: Secondary | ICD-10-CM | POA: Diagnosis not present

## 2022-07-04 DIAGNOSIS — M5416 Radiculopathy, lumbar region: Secondary | ICD-10-CM | POA: Diagnosis not present

## 2022-07-04 DIAGNOSIS — J439 Emphysema, unspecified: Secondary | ICD-10-CM | POA: Diagnosis not present

## 2022-07-04 DIAGNOSIS — K5903 Drug induced constipation: Secondary | ICD-10-CM | POA: Diagnosis not present

## 2022-07-04 DIAGNOSIS — F17210 Nicotine dependence, cigarettes, uncomplicated: Secondary | ICD-10-CM | POA: Diagnosis not present

## 2022-07-04 DIAGNOSIS — G8929 Other chronic pain: Secondary | ICD-10-CM | POA: Diagnosis not present

## 2022-07-04 DIAGNOSIS — Z23 Encounter for immunization: Secondary | ICD-10-CM | POA: Diagnosis not present

## 2022-07-04 DIAGNOSIS — M25572 Pain in left ankle and joints of left foot: Secondary | ICD-10-CM | POA: Diagnosis not present

## 2022-07-23 DIAGNOSIS — R1319 Other dysphagia: Secondary | ICD-10-CM | POA: Diagnosis not present

## 2022-07-30 DIAGNOSIS — M542 Cervicalgia: Secondary | ICD-10-CM | POA: Diagnosis not present

## 2022-07-30 DIAGNOSIS — M546 Pain in thoracic spine: Secondary | ICD-10-CM | POA: Diagnosis not present

## 2022-07-30 DIAGNOSIS — M25519 Pain in unspecified shoulder: Secondary | ICD-10-CM | POA: Diagnosis not present

## 2022-07-30 DIAGNOSIS — M25511 Pain in right shoulder: Secondary | ICD-10-CM | POA: Diagnosis not present

## 2022-07-30 DIAGNOSIS — M62830 Muscle spasm of back: Secondary | ICD-10-CM | POA: Diagnosis not present

## 2022-08-05 DIAGNOSIS — F17210 Nicotine dependence, cigarettes, uncomplicated: Secondary | ICD-10-CM | POA: Diagnosis not present

## 2022-08-05 DIAGNOSIS — M62830 Muscle spasm of back: Secondary | ICD-10-CM | POA: Diagnosis not present

## 2022-08-05 DIAGNOSIS — Z79899 Other long term (current) drug therapy: Secondary | ICD-10-CM | POA: Diagnosis not present

## 2022-08-05 DIAGNOSIS — M5416 Radiculopathy, lumbar region: Secondary | ICD-10-CM | POA: Diagnosis not present

## 2022-08-05 DIAGNOSIS — G8929 Other chronic pain: Secondary | ICD-10-CM | POA: Diagnosis not present

## 2022-08-05 DIAGNOSIS — G629 Polyneuropathy, unspecified: Secondary | ICD-10-CM | POA: Diagnosis not present

## 2022-08-05 DIAGNOSIS — M25572 Pain in left ankle and joints of left foot: Secondary | ICD-10-CM | POA: Diagnosis not present

## 2022-08-05 DIAGNOSIS — K5903 Drug induced constipation: Secondary | ICD-10-CM | POA: Diagnosis not present

## 2022-08-07 DIAGNOSIS — Z79899 Other long term (current) drug therapy: Secondary | ICD-10-CM | POA: Diagnosis not present

## 2022-08-22 DIAGNOSIS — R1319 Other dysphagia: Secondary | ICD-10-CM | POA: Diagnosis not present

## 2022-08-22 DIAGNOSIS — Z1211 Encounter for screening for malignant neoplasm of colon: Secondary | ICD-10-CM | POA: Diagnosis not present

## 2022-08-29 DIAGNOSIS — M542 Cervicalgia: Secondary | ICD-10-CM | POA: Diagnosis not present

## 2022-08-29 DIAGNOSIS — M25511 Pain in right shoulder: Secondary | ICD-10-CM | POA: Diagnosis not present

## 2022-08-29 DIAGNOSIS — M62830 Muscle spasm of back: Secondary | ICD-10-CM | POA: Diagnosis not present

## 2022-08-29 DIAGNOSIS — M25519 Pain in unspecified shoulder: Secondary | ICD-10-CM | POA: Diagnosis not present

## 2022-08-29 DIAGNOSIS — M546 Pain in thoracic spine: Secondary | ICD-10-CM | POA: Diagnosis not present

## 2022-09-04 DIAGNOSIS — Z7689 Persons encountering health services in other specified circumstances: Secondary | ICD-10-CM | POA: Diagnosis not present

## 2022-09-04 DIAGNOSIS — G8929 Other chronic pain: Secondary | ICD-10-CM | POA: Diagnosis not present

## 2022-09-04 DIAGNOSIS — Z79899 Other long term (current) drug therapy: Secondary | ICD-10-CM | POA: Diagnosis not present

## 2022-09-04 DIAGNOSIS — K5903 Drug induced constipation: Secondary | ICD-10-CM | POA: Diagnosis not present

## 2022-09-04 DIAGNOSIS — M62830 Muscle spasm of back: Secondary | ICD-10-CM | POA: Diagnosis not present

## 2022-09-04 DIAGNOSIS — M25572 Pain in left ankle and joints of left foot: Secondary | ICD-10-CM | POA: Diagnosis not present

## 2022-09-04 DIAGNOSIS — G629 Polyneuropathy, unspecified: Secondary | ICD-10-CM | POA: Diagnosis not present

## 2022-09-04 DIAGNOSIS — F17210 Nicotine dependence, cigarettes, uncomplicated: Secondary | ICD-10-CM | POA: Diagnosis not present

## 2022-09-04 DIAGNOSIS — M5416 Radiculopathy, lumbar region: Secondary | ICD-10-CM | POA: Diagnosis not present

## 2022-09-18 DIAGNOSIS — R1319 Other dysphagia: Secondary | ICD-10-CM | POA: Diagnosis not present

## 2022-09-18 DIAGNOSIS — K2970 Gastritis, unspecified, without bleeding: Secondary | ICD-10-CM | POA: Diagnosis not present

## 2022-09-25 DIAGNOSIS — Z79899 Other long term (current) drug therapy: Secondary | ICD-10-CM | POA: Diagnosis not present

## 2022-09-25 DIAGNOSIS — R569 Unspecified convulsions: Secondary | ICD-10-CM | POA: Diagnosis not present

## 2022-09-25 DIAGNOSIS — E559 Vitamin D deficiency, unspecified: Secondary | ICD-10-CM | POA: Diagnosis not present

## 2022-09-25 DIAGNOSIS — J439 Emphysema, unspecified: Secondary | ICD-10-CM | POA: Diagnosis not present

## 2022-09-25 DIAGNOSIS — Z131 Encounter for screening for diabetes mellitus: Secondary | ICD-10-CM | POA: Diagnosis not present

## 2022-09-25 DIAGNOSIS — Z0000 Encounter for general adult medical examination without abnormal findings: Secondary | ICD-10-CM | POA: Diagnosis not present

## 2022-09-25 DIAGNOSIS — R5383 Other fatigue: Secondary | ICD-10-CM | POA: Diagnosis not present

## 2022-09-25 DIAGNOSIS — R0602 Shortness of breath: Secondary | ICD-10-CM | POA: Diagnosis not present

## 2022-09-25 DIAGNOSIS — E7800 Pure hypercholesterolemia, unspecified: Secondary | ICD-10-CM | POA: Diagnosis not present

## 2022-09-25 DIAGNOSIS — D539 Nutritional anemia, unspecified: Secondary | ICD-10-CM | POA: Diagnosis not present

## 2022-09-25 DIAGNOSIS — Z1159 Encounter for screening for other viral diseases: Secondary | ICD-10-CM | POA: Diagnosis not present

## 2022-09-25 DIAGNOSIS — R300 Dysuria: Secondary | ICD-10-CM | POA: Diagnosis not present

## 2022-09-29 DIAGNOSIS — M25511 Pain in right shoulder: Secondary | ICD-10-CM | POA: Diagnosis not present

## 2022-09-29 DIAGNOSIS — M62830 Muscle spasm of back: Secondary | ICD-10-CM | POA: Diagnosis not present

## 2022-09-29 DIAGNOSIS — M542 Cervicalgia: Secondary | ICD-10-CM | POA: Diagnosis not present

## 2022-09-29 DIAGNOSIS — M546 Pain in thoracic spine: Secondary | ICD-10-CM | POA: Diagnosis not present

## 2022-09-29 DIAGNOSIS — M25519 Pain in unspecified shoulder: Secondary | ICD-10-CM | POA: Diagnosis not present

## 2022-10-02 ENCOUNTER — Ambulatory Visit (INDEPENDENT_AMBULATORY_CARE_PROVIDER_SITE_OTHER): Payer: 59 | Admitting: Neurology

## 2022-10-02 ENCOUNTER — Encounter: Payer: Self-pay | Admitting: Neurology

## 2022-10-02 VITALS — BP 131/78 | HR 53 | Ht 68.0 in | Wt 176.5 lb

## 2022-10-02 DIAGNOSIS — G43709 Chronic migraine without aura, not intractable, without status migrainosus: Secondary | ICD-10-CM

## 2022-10-02 DIAGNOSIS — Z87820 Personal history of traumatic brain injury: Secondary | ICD-10-CM

## 2022-10-02 DIAGNOSIS — R569 Unspecified convulsions: Secondary | ICD-10-CM | POA: Diagnosis not present

## 2022-10-02 MED ORDER — RIZATRIPTAN BENZOATE 10 MG PO TABS: ORAL_TABLET | ORAL | 11 refills | Status: DC

## 2022-10-02 MED ORDER — ZONISAMIDE 50 MG PO CAPS: 50.0000 mg | ORAL_CAPSULE | Freq: Two times a day (BID) | ORAL | 11 refills | Status: DC

## 2022-10-02 MED ORDER — EMGALITY 120 MG/ML ~~LOC~~ SOAJ: 1.0000 "pen " | SUBCUTANEOUS | 11 refills | Status: DC

## 2022-10-02 MED ORDER — ZONISAMIDE 100 MG PO CAPS: 100.0000 mg | ORAL_CAPSULE | Freq: Two times a day (BID) | ORAL | 3 refills | Status: DC
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
## 2022-10-02 NOTE — Patient Instructions (Addendum)
Increase Zonegran to 150 mg twice daily, start by increasing the evening for for 1 week, then increase both doses to total of 150 mg twice daily, continue phenobarbital   Check labs today   [place] driving law is no driving until seizure free 6 months   Check EEG  Meds ordered this encounter  Medications   rizatriptan (MAXALT) 10 MG tablet    Sig: TAKE 1 TABLET BY MOUTH AS NEEDED FOR MIGRAINE. MAY REPEAT IN 2 HOURS IF NEEDED    Dispense:  10 tablet    Refill:  11   Galcanezumab-gnlm (EMGALITY) 120 MG/ML SOAJ    Sig: Inject 1 pen  into the skin every 30 (thirty) days.    Dispense:  1.12 mL    Refill:  11   zonisamide (ZONEGRAN) 50 MG capsule    Sig: Take 1 capsule (50 mg total) by mouth in the morning and at bedtime. Take along with the 100 mg capsule twice daily    Dispense:  60 capsule    Refill:  11   zonisamide (ZONEGRAN) 100 MG capsule    Sig: Take 1 capsule (100 mg total) by mouth 2 (two) times daily.    Dispense:  180 capsule    Refill:  3

## 2022-10-03 DIAGNOSIS — Z9181 History of falling: Secondary | ICD-10-CM | POA: Diagnosis not present

## 2022-10-03 DIAGNOSIS — M79604 Pain in right leg: Secondary | ICD-10-CM | POA: Diagnosis not present

## 2022-10-03 DIAGNOSIS — M79671 Pain in right foot: Secondary | ICD-10-CM | POA: Diagnosis not present

## 2022-10-03 DIAGNOSIS — F17210 Nicotine dependence, cigarettes, uncomplicated: Secondary | ICD-10-CM | POA: Diagnosis not present

## 2022-10-03 DIAGNOSIS — M5416 Radiculopathy, lumbar region: Secondary | ICD-10-CM | POA: Diagnosis not present

## 2022-10-03 DIAGNOSIS — M25572 Pain in left ankle and joints of left foot: Secondary | ICD-10-CM | POA: Diagnosis not present

## 2022-10-03 DIAGNOSIS — M79672 Pain in left foot: Secondary | ICD-10-CM | POA: Diagnosis not present

## 2022-10-03 DIAGNOSIS — M62830 Muscle spasm of back: Secondary | ICD-10-CM | POA: Diagnosis not present

## 2022-10-03 DIAGNOSIS — G629 Polyneuropathy, unspecified: Secondary | ICD-10-CM | POA: Diagnosis not present

## 2022-10-03 DIAGNOSIS — G8929 Other chronic pain: Secondary | ICD-10-CM | POA: Diagnosis not present

## 2022-10-03 DIAGNOSIS — Z79899 Other long term (current) drug therapy: Secondary | ICD-10-CM | POA: Diagnosis not present

## 2022-10-03 DIAGNOSIS — M79605 Pain in left leg: Secondary | ICD-10-CM | POA: Diagnosis not present

## 2022-10-04 LAB — CBC WITH DIFFERENTIAL/PLATELET
Basophils Absolute: 0 10*3/uL (ref 0.0–0.2)
Basos: 0 %
EOS (ABSOLUTE): 0.1 10*3/uL (ref 0.0–0.4)
Eos: 1 %
Hematocrit: 43.4 % (ref 37.5–51.0)
Hemoglobin: 14.4 g/dL (ref 13.0–17.7)
Immature Grans (Abs): 0 10*3/uL (ref 0.0–0.1)
Immature Granulocytes: 0 %
Lymphocytes Absolute: 1.7 10*3/uL (ref 0.7–3.1)
Lymphs: 23 %
MCH: 29.4 pg (ref 26.6–33.0)
MCHC: 33.2 g/dL (ref 31.5–35.7)
MCV: 89 fL (ref 79–97)
Monocytes Absolute: 0.4 10*3/uL (ref 0.1–0.9)
Monocytes: 6 %
Neutrophils Absolute: 5.1 10*3/uL (ref 1.4–7.0)
Neutrophils: 70 %
Platelets: 348 10*3/uL (ref 150–450)
RBC: 4.9 x10E6/uL (ref 4.14–5.80)
RDW: 12.3 % (ref 11.6–15.4)
WBC: 7.4 10*3/uL (ref 3.4–10.8)

## 2022-10-04 LAB — COMPREHENSIVE METABOLIC PANEL
ALT: 13 IU/L (ref 0–44)
AST: 14 IU/L (ref 0–40)
Albumin/Globulin Ratio: 2 (ref 1.2–2.2)
Albumin: 4.7 g/dL (ref 3.8–4.9)
Alkaline Phosphatase: 102 IU/L (ref 44–121)
BUN/Creatinine Ratio: 15 (ref 9–20)
BUN: 13 mg/dL (ref 6–24)
Bilirubin Total: <0.2 mg/dL (ref 0.0–1.2)
CO2: 25 mmol/L (ref 20–29)
Calcium: 9.4 mg/dL (ref 8.7–10.2)
Chloride: 103 mmol/L (ref 96–106)
Creatinine, Ser: 0.85 mg/dL (ref 0.76–1.27)
Globulin, Total: 2.4 g/dL (ref 1.5–4.5)
Glucose: 102 mg/dL — ABNORMAL HIGH (ref 70–99)
Potassium: 4.6 mmol/L (ref 3.5–5.2)
Sodium: 144 mmol/L (ref 134–144)
Total Protein: 7.1 g/dL (ref 6.0–8.5)
eGFR: 104 mL/min/{1.73_m2} (ref >59)

## 2022-10-04 LAB — ZONISAMIDE LEVEL: Zonisamide: 6.1 ug/mL — ABNORMAL LOW (ref 10.0–40.0)

## 2022-10-04 LAB — PHENOBARBITAL LEVEL: Phenobarbital, Serum: 31 ug/mL (ref 15–40)

## 2022-10-07 ENCOUNTER — Telehealth: Payer: Self-pay

## 2022-10-07 DIAGNOSIS — Z79899 Other long term (current) drug therapy: Secondary | ICD-10-CM | POA: Diagnosis not present

## 2022-10-07 NOTE — Telephone Encounter (Signed)
Called and spoke to patient in regards to lab results and zonegran, he said he has been great but would reach if he had any issues or concerns.

## 2022-10-07 NOTE — Telephone Encounter (Signed)
-----  Message from [name], NP sent at 10/07/2022  2:42 PM EST ----- Please call, blood work overall looks good. We increased Zonegran, the level was on the low end so we have the room to increase. Have him let us know if any issues. Thanks

## 2022-10-17 ENCOUNTER — Ambulatory Visit (INDEPENDENT_AMBULATORY_CARE_PROVIDER_SITE_OTHER): Payer: 59 | Admitting: Neurology

## 2022-10-17 DIAGNOSIS — R569 Unspecified convulsions: Secondary | ICD-10-CM

## 2022-10-17 NOTE — Procedures (Signed)
    History:  52 year old man with seizure disorder   EEG classification: Awake and drowsy  Description of the recording: The background rhythms of this recording consists of a fairly well modulated medium amplitude alpha rhythm of 10 Hz that is reactive to eye opening and closure. Present in the anterior head region is a 15-20 Hz beta activity. Photic stimulation was performed, did not show any abnormalities. Hyperventilation was also performed, did not show any abnormalities. Drowsiness was manifested by background fragmentation. No abnormal epileptiform discharges seen during this recording. There was no focal slowing. There were no electrographic seizure identified.   Abnormality: None   Impression: This is a normal EEG recorded while drowsy and awake. No evidence of interictal epileptiform discharges. Normal EEGs, however, do not rule out epilepsy.    Alric Ran, MD [facility]

## 2022-10-21 ENCOUNTER — Telehealth: Payer: Self-pay

## 2022-10-21 NOTE — Telephone Encounter (Signed)
I called and spoke with the patient. I informed him of the results. He verbalized understanding and expressed appreciation for the call. He stated he is doing well with the medication adjustment.

## 2022-10-21 NOTE — Telephone Encounter (Signed)
-----   Message from [name], NP sent at 10/17/2022  9:40 PM EST ----- Please call, EEG was normal. I hope he is doing well with the medication adjustment.   Impression: This is a normal EEG recorded while drowsy and awake. No evidence of interictal epileptiform discharges. Normal EEGs, however, do not rule out epilepsy.

## 2022-10-30 DIAGNOSIS — M542 Cervicalgia: Secondary | ICD-10-CM | POA: Diagnosis not present

## 2022-10-30 DIAGNOSIS — M546 Pain in thoracic spine: Secondary | ICD-10-CM | POA: Diagnosis not present

## 2022-10-30 DIAGNOSIS — M25519 Pain in unspecified shoulder: Secondary | ICD-10-CM | POA: Diagnosis not present

## 2022-10-30 DIAGNOSIS — M62830 Muscle spasm of back: Secondary | ICD-10-CM | POA: Diagnosis not present

## 2022-10-30 DIAGNOSIS — M25511 Pain in right shoulder: Secondary | ICD-10-CM | POA: Diagnosis not present

## 2022-10-31 DIAGNOSIS — F17210 Nicotine dependence, cigarettes, uncomplicated: Secondary | ICD-10-CM | POA: Diagnosis not present

## 2022-10-31 DIAGNOSIS — M5416 Radiculopathy, lumbar region: Secondary | ICD-10-CM | POA: Diagnosis not present

## 2022-10-31 DIAGNOSIS — M79605 Pain in left leg: Secondary | ICD-10-CM | POA: Diagnosis not present

## 2022-10-31 DIAGNOSIS — Z79899 Other long term (current) drug therapy: Secondary | ICD-10-CM | POA: Diagnosis not present

## 2022-10-31 DIAGNOSIS — M25572 Pain in left ankle and joints of left foot: Secondary | ICD-10-CM | POA: Diagnosis not present

## 2022-10-31 DIAGNOSIS — G629 Polyneuropathy, unspecified: Secondary | ICD-10-CM | POA: Diagnosis not present

## 2022-10-31 DIAGNOSIS — Z9181 History of falling: Secondary | ICD-10-CM | POA: Diagnosis not present

## 2022-10-31 DIAGNOSIS — G8929 Other chronic pain: Secondary | ICD-10-CM | POA: Diagnosis not present

## 2022-10-31 DIAGNOSIS — M79604 Pain in right leg: Secondary | ICD-10-CM | POA: Diagnosis not present

## 2022-10-31 DIAGNOSIS — M62830 Muscle spasm of back: Secondary | ICD-10-CM | POA: Diagnosis not present

## 2022-10-31 DIAGNOSIS — J439 Emphysema, unspecified: Secondary | ICD-10-CM | POA: Diagnosis not present

## 2022-11-17 ENCOUNTER — Other Ambulatory Visit: Payer: Self-pay

## 2022-11-17 ENCOUNTER — Inpatient Hospital Stay (HOSPITAL_COMMUNITY)
Admission: EM | Admit: 2022-11-17 | Discharge: 2022-11-22 | DRG: 481 | Disposition: A | Discharge status: Skilled Nursing Facility | Payer: 59 | Attending: Internal Medicine | Admitting: Internal Medicine

## 2022-11-17 ENCOUNTER — Inpatient Hospital Stay (HOSPITAL_COMMUNITY): Payer: 59

## 2022-11-17 ENCOUNTER — Encounter (HOSPITAL_COMMUNITY): Payer: Self-pay

## 2022-11-17 ENCOUNTER — Emergency Department (HOSPITAL_COMMUNITY): Payer: 59

## 2022-11-17 DIAGNOSIS — G9389 Other specified disorders of brain: Secondary | ICD-10-CM | POA: Diagnosis not present

## 2022-11-17 DIAGNOSIS — G4700 Insomnia, unspecified: Secondary | ICD-10-CM | POA: Diagnosis present

## 2022-11-17 DIAGNOSIS — G2581 Restless legs syndrome: Secondary | ICD-10-CM | POA: Diagnosis present

## 2022-11-17 DIAGNOSIS — Z87820 Personal history of traumatic brain injury: Secondary | ICD-10-CM | POA: Diagnosis not present

## 2022-11-17 DIAGNOSIS — M9701XA Periprosthetic fracture around internal prosthetic right hip joint, initial encounter: Secondary | ICD-10-CM | POA: Diagnosis present

## 2022-11-17 DIAGNOSIS — R279 Unspecified lack of coordination: Secondary | ICD-10-CM | POA: Diagnosis not present

## 2022-11-17 DIAGNOSIS — Z8249 Family history of ischemic heart disease and other diseases of the circulatory system: Secondary | ICD-10-CM

## 2022-11-17 DIAGNOSIS — Z79899 Other long term (current) drug therapy: Secondary | ICD-10-CM

## 2022-11-17 DIAGNOSIS — I10 Essential (primary) hypertension: Secondary | ICD-10-CM | POA: Diagnosis not present

## 2022-11-17 DIAGNOSIS — Z96649 Presence of unspecified artificial hip joint: Secondary | ICD-10-CM | POA: Diagnosis not present

## 2022-11-17 DIAGNOSIS — M6282 Rhabdomyolysis: Secondary | ICD-10-CM | POA: Diagnosis not present

## 2022-11-17 DIAGNOSIS — S92151D Displaced avulsion fracture (chip fracture) of right talus, subsequent encounter for fracture with routine healing: Secondary | ICD-10-CM | POA: Diagnosis not present

## 2022-11-17 DIAGNOSIS — R531 Weakness: Secondary | ICD-10-CM | POA: Diagnosis not present

## 2022-11-17 DIAGNOSIS — Z4789 Encounter for other orthopedic aftercare: Secondary | ICD-10-CM | POA: Diagnosis not present

## 2022-11-17 DIAGNOSIS — F411 Generalized anxiety disorder: Secondary | ICD-10-CM | POA: Diagnosis present

## 2022-11-17 DIAGNOSIS — S72001A Fracture of unspecified part of neck of right femur, initial encounter for closed fracture: Principal | ICD-10-CM | POA: Diagnosis present

## 2022-11-17 DIAGNOSIS — T1490XA Injury, unspecified, initial encounter: Principal | ICD-10-CM

## 2022-11-17 DIAGNOSIS — G44329 Chronic post-traumatic headache, not intractable: Secondary | ICD-10-CM | POA: Diagnosis not present

## 2022-11-17 DIAGNOSIS — I4891 Unspecified atrial fibrillation: Secondary | ICD-10-CM | POA: Diagnosis present

## 2022-11-17 DIAGNOSIS — R339 Retention of urine, unspecified: Secondary | ICD-10-CM | POA: Diagnosis present

## 2022-11-17 DIAGNOSIS — E785 Hyperlipidemia, unspecified: Secondary | ICD-10-CM | POA: Diagnosis not present

## 2022-11-17 DIAGNOSIS — Y9289 Other specified places as the place of occurrence of the external cause: Secondary | ICD-10-CM | POA: Diagnosis not present

## 2022-11-17 DIAGNOSIS — M978XXA Periprosthetic fracture around other internal prosthetic joint, initial encounter: Secondary | ICD-10-CM

## 2022-11-17 DIAGNOSIS — K219 Gastro-esophageal reflux disease without esophagitis: Secondary | ICD-10-CM | POA: Diagnosis present

## 2022-11-17 DIAGNOSIS — G894 Chronic pain syndrome: Secondary | ICD-10-CM | POA: Diagnosis not present

## 2022-11-17 DIAGNOSIS — Z720 Tobacco use: Secondary | ICD-10-CM

## 2022-11-17 DIAGNOSIS — M5432 Sciatica, left side: Secondary | ICD-10-CM | POA: Diagnosis not present

## 2022-11-17 DIAGNOSIS — S72331A Displaced oblique fracture of shaft of right femur, initial encounter for closed fracture: Secondary | ICD-10-CM | POA: Diagnosis not present

## 2022-11-17 DIAGNOSIS — N281 Cyst of kidney, acquired: Secondary | ICD-10-CM | POA: Diagnosis not present

## 2022-11-17 DIAGNOSIS — W19XXXA Unspecified fall, initial encounter: Secondary | ICD-10-CM | POA: Diagnosis not present

## 2022-11-17 DIAGNOSIS — Z981 Arthrodesis status: Secondary | ICD-10-CM

## 2022-11-17 DIAGNOSIS — E872 Acidosis, unspecified: Secondary | ICD-10-CM | POA: Diagnosis not present

## 2022-11-17 DIAGNOSIS — N400 Enlarged prostate without lower urinary tract symptoms: Secondary | ICD-10-CM | POA: Diagnosis present

## 2022-11-17 DIAGNOSIS — Z743 Need for continuous supervision: Secondary | ICD-10-CM | POA: Diagnosis not present

## 2022-11-17 DIAGNOSIS — S82141D Displaced bicondylar fracture of right tibia, subsequent encounter for closed fracture with routine healing: Secondary | ICD-10-CM | POA: Diagnosis not present

## 2022-11-17 DIAGNOSIS — S72401A Unspecified fracture of lower end of right femur, initial encounter for closed fracture: Secondary | ICD-10-CM | POA: Diagnosis not present

## 2022-11-17 DIAGNOSIS — M978XXD Periprosthetic fracture around other internal prosthetic joint, subsequent encounter: Secondary | ICD-10-CM | POA: Diagnosis not present

## 2022-11-17 DIAGNOSIS — D62 Acute posthemorrhagic anemia: Secondary | ICD-10-CM | POA: Diagnosis not present

## 2022-11-17 DIAGNOSIS — S82141A Displaced bicondylar fracture of right tibia, initial encounter for closed fracture: Secondary | ICD-10-CM | POA: Diagnosis not present

## 2022-11-17 DIAGNOSIS — S7291XD Unspecified fracture of right femur, subsequent encounter for closed fracture with routine healing: Secondary | ICD-10-CM | POA: Diagnosis not present

## 2022-11-17 DIAGNOSIS — S82121A Displaced fracture of lateral condyle of right tibia, initial encounter for closed fracture: Secondary | ICD-10-CM

## 2022-11-17 DIAGNOSIS — W208XXD Other cause of strike by thrown, projected or falling object, subsequent encounter: Secondary | ICD-10-CM | POA: Diagnosis not present

## 2022-11-17 DIAGNOSIS — Z7401 Bed confinement status: Secondary | ICD-10-CM | POA: Diagnosis not present

## 2022-11-17 DIAGNOSIS — M25551 Pain in right hip: Secondary | ICD-10-CM | POA: Diagnosis present

## 2022-11-17 DIAGNOSIS — M6281 Muscle weakness (generalized): Secondary | ICD-10-CM | POA: Diagnosis not present

## 2022-11-17 DIAGNOSIS — W208XXA Other cause of strike by thrown, projected or falling object, initial encounter: Secondary | ICD-10-CM | POA: Diagnosis present

## 2022-11-17 DIAGNOSIS — G40909 Epilepsy, unspecified, not intractable, without status epilepticus: Secondary | ICD-10-CM | POA: Diagnosis not present

## 2022-11-17 DIAGNOSIS — T07XXXA Unspecified multiple injuries, initial encounter: Secondary | ICD-10-CM | POA: Diagnosis not present

## 2022-11-17 DIAGNOSIS — S72332A Displaced oblique fracture of shaft of left femur, initial encounter for closed fracture: Secondary | ICD-10-CM | POA: Diagnosis not present

## 2022-11-17 DIAGNOSIS — Z96641 Presence of right artificial hip joint: Secondary | ICD-10-CM | POA: Diagnosis not present

## 2022-11-17 DIAGNOSIS — S82121D Displaced fracture of lateral condyle of right tibia, subsequent encounter for closed fracture with routine healing: Secondary | ICD-10-CM | POA: Diagnosis not present

## 2022-11-17 DIAGNOSIS — M25461 Effusion, right knee: Secondary | ICD-10-CM | POA: Diagnosis not present

## 2022-11-17 DIAGNOSIS — J439 Emphysema, unspecified: Secondary | ICD-10-CM | POA: Diagnosis not present

## 2022-11-17 DIAGNOSIS — E440 Moderate protein-calorie malnutrition: Secondary | ICD-10-CM | POA: Insufficient documentation

## 2022-11-17 DIAGNOSIS — K5730 Diverticulosis of large intestine without perforation or abscess without bleeding: Secondary | ICD-10-CM | POA: Diagnosis not present

## 2022-11-17 DIAGNOSIS — R079 Chest pain, unspecified: Secondary | ICD-10-CM | POA: Diagnosis not present

## 2022-11-17 DIAGNOSIS — R9431 Abnormal electrocardiogram [ECG] [EKG]: Secondary | ICD-10-CM | POA: Diagnosis not present

## 2022-11-17 DIAGNOSIS — M545 Low back pain, unspecified: Secondary | ICD-10-CM | POA: Diagnosis not present

## 2022-11-17 DIAGNOSIS — M9701XD Periprosthetic fracture around internal prosthetic right hip joint, subsequent encounter: Secondary | ICD-10-CM | POA: Diagnosis not present

## 2022-11-17 DIAGNOSIS — D649 Anemia, unspecified: Secondary | ICD-10-CM | POA: Diagnosis not present

## 2022-11-17 LAB — SAMPLE TO BLOOD BANK

## 2022-11-17 LAB — ETHANOL: Alcohol, Ethyl (B): <10 mg/dL (ref <10)

## 2022-11-17 LAB — COMPREHENSIVE METABOLIC PANEL
ALT: 17 U/L (ref 0–44)
AST: 28 U/L (ref 15–41)
Albumin: 4.1 g/dL (ref 3.5–5.0)
Alkaline Phosphatase: 80 U/L (ref 38–126)
Anion gap: 13 (ref 5–15)
BUN: 12 mg/dL (ref 6–20)
CO2: 19 mmol/L — ABNORMAL LOW (ref 22–32)
Calcium: 8.6 mg/dL — ABNORMAL LOW (ref 8.9–10.3)
Chloride: 108 mmol/L (ref 98–111)
Creatinine, Ser: 0.92 mg/dL (ref 0.61–1.24)
GFR, Estimated: >60 mL/min (ref >60)
Glucose, Bld: 92 mg/dL (ref 70–99)
Potassium: 3.8 mmol/L (ref 3.5–5.1)
Sodium: 140 mmol/L (ref 135–145)
Total Bilirubin: 0.7 mg/dL (ref 0.3–1.2)
Total Protein: 6.4 g/dL — ABNORMAL LOW (ref 6.5–8.1)

## 2022-11-17 LAB — I-STAT CHEM 8, ED
BUN: 12 mg/dL (ref 6–20)
Calcium, Ion: 1.05 mmol/L — ABNORMAL LOW (ref 1.15–1.40)
Chloride: 106 mmol/L (ref 98–111)
Creatinine, Ser: 1 mg/dL (ref 0.61–1.24)
Glucose, Bld: 92 mg/dL (ref 70–99)
HCT: 40 % (ref 39.0–52.0)
Hemoglobin: 13.6 g/dL (ref 13.0–17.0)
Potassium: 3.4 mmol/L — ABNORMAL LOW (ref 3.5–5.1)
Sodium: 139 mmol/L (ref 135–145)
TCO2: 22 mmol/L (ref 22–32)

## 2022-11-17 LAB — CBC
HCT: 40.6 % (ref 39.0–52.0)
Hemoglobin: 13.1 g/dL (ref 13.0–17.0)
MCH: 30.1 pg (ref 26.0–34.0)
MCHC: 32.3 g/dL (ref 30.0–36.0)
MCV: 93.3 fL (ref 80.0–100.0)
Platelets: 245 10*3/uL (ref 150–400)
RBC: 4.35 MIL/uL (ref 4.22–5.81)
RDW: 13.2 % (ref 11.5–15.5)
WBC: 10.6 10*3/uL — ABNORMAL HIGH (ref 4.0–10.5)
nRBC: 0 % (ref 0.0–0.2)

## 2022-11-17 LAB — LACTIC ACID, PLASMA
Lactic Acid, Venous: 2.7 mmol/L (ref 0.5–1.9)
Lactic Acid, Venous: 1 mmol/L (ref 0.5–1.9)

## 2022-11-17 LAB — PROTIME-INR
INR: 1.2 (ref 0.8–1.2)
Prothrombin Time: 15.5 seconds — ABNORMAL HIGH (ref 11.4–15.2)

## 2022-11-17 LAB — HIV ANTIBODY (ROUTINE TESTING W REFLEX): HIV Screen 4th Generation wRfx: NONREACTIVE

## 2022-11-17 MED ORDER — SODIUM CHLORIDE 0.9 % IV SOLN: INTRAVENOUS | Status: AC

## 2022-11-17 MED ORDER — FENTANYL CITRATE PF 50 MCG/ML IJ SOSY
PREFILLED_SYRINGE | INTRAMUSCULAR | Status: AC
  Filled 2022-11-17: qty 1

## 2022-11-17 MED ORDER — GABAPENTIN 600 MG PO TABS: 600.0000 mg | ORAL_TABLET | ORAL | Status: DC

## 2022-11-17 MED ORDER — GABAPENTIN 400 MG PO CAPS
1200.0000 mg | ORAL_CAPSULE | Freq: Every day | ORAL | Status: DC
  Administered 2022-11-17 – 2022-11-21 (×5): 1200 mg via ORAL
  Filled 2022-11-17 (×5): qty 3

## 2022-11-17 MED ORDER — IOHEXOL 350 MG/ML SOLN
75.0000 mL | Freq: Once | INTRAVENOUS | Status: AC | PRN
  Administered 2022-11-17: 75 mL via INTRAVENOUS

## 2022-11-17 MED ORDER — ZONISAMIDE 100 MG PO CAPS: 100.0000 mg | ORAL_CAPSULE | Freq: Two times a day (BID) | ORAL | Status: DC

## 2022-11-17 MED ORDER — DICLOFENAC SODIUM 1 % EX GEL: 2.0000 g | Freq: Every day | CUTANEOUS | Status: DC | PRN

## 2022-11-17 MED ORDER — HYDROMORPHONE HCL 1 MG/ML IJ SOLN
1.0000 mg | Freq: Once | INTRAMUSCULAR | Status: AC
  Administered 2022-11-17: 1 mg via INTRAVENOUS
  Filled 2022-11-17: qty 1

## 2022-11-17 MED ORDER — FLUTICASONE PROPIONATE 50 MCG/ACT NA SUSP: 2.0000 | Freq: Every day | NASAL | Status: DC | PRN

## 2022-11-17 MED ORDER — ZONISAMIDE 50 MG PO CAPS: 50.0000 mg | ORAL_CAPSULE | Freq: Two times a day (BID) | ORAL | Status: DC

## 2022-11-17 MED ORDER — METHOCARBAMOL 1000 MG/10ML IJ SOLN: 1000.0000 mg | Freq: Once | INTRAMUSCULAR | Status: DC

## 2022-11-17 MED ORDER — METHOCARBAMOL 1000 MG/10ML IJ SOLN: 500.0000 mg | Freq: Four times a day (QID) | INTRAVENOUS | Status: DC | PRN

## 2022-11-17 MED ORDER — ALPRAZOLAM 0.5 MG PO TABS: 1.0000 mg | ORAL_TABLET | Freq: Every day | ORAL | Status: DC | PRN

## 2022-11-17 MED ORDER — OXYCODONE-ACETAMINOPHEN 7.5-325 MG PO TABS: 1.0000 | ORAL_TABLET | ORAL | Status: DC | PRN

## 2022-11-17 MED ORDER — ONDANSETRON HCL 4 MG/2ML IJ SOLN: 4.0000 mg | Freq: Four times a day (QID) | INTRAMUSCULAR | Status: DC | PRN

## 2022-11-17 MED ORDER — FENTANYL CITRATE PF 50 MCG/ML IJ SOSY
PREFILLED_SYRINGE | INTRAMUSCULAR | Status: AC | PRN
  Administered 2022-11-17: 50 ug via INTRAVENOUS

## 2022-11-17 MED ORDER — METHOCARBAMOL 500 MG PO TABS
500.0000 mg | ORAL_TABLET | Freq: Four times a day (QID) | ORAL | Status: DC | PRN
  Administered 2022-11-17: 500 mg via ORAL
  Filled 2022-11-17: qty 1

## 2022-11-17 MED ORDER — SUMATRIPTAN SUCCINATE 50 MG PO TABS: 50.0000 mg | ORAL_TABLET | ORAL | Status: DC | PRN

## 2022-11-17 MED ORDER — ONDANSETRON HCL 4 MG/2ML IJ SOLN
INTRAMUSCULAR | Status: AC
  Filled 2022-11-17: qty 2

## 2022-11-17 MED ORDER — HYDROXYZINE HCL 25 MG PO TABS: 25.0000 mg | ORAL_TABLET | Freq: Three times a day (TID) | ORAL | Status: DC | PRN

## 2022-11-17 MED ORDER — HYDROMORPHONE HCL 1 MG/ML IJ SOLN
0.5000 mg | INTRAMUSCULAR | Status: DC | PRN
  Administered 2022-11-17 – 2022-11-18 (×5): 0.5 mg via INTRAVENOUS
  Filled 2022-11-17 (×4): qty 0.5

## 2022-11-17 MED ORDER — LORATADINE 10 MG PO TABS
10.0000 mg | ORAL_TABLET | Freq: Every day | ORAL | Status: DC
  Administered 2022-11-17 – 2022-11-22 (×6): 10 mg via ORAL
  Filled 2022-11-17 (×6): qty 1

## 2022-11-17 MED ORDER — PHENOBARBITAL 32.4 MG PO TABS
194.4000 mg | ORAL_TABLET | Freq: Every day | ORAL | Status: DC
  Administered 2022-11-17 – 2022-11-21 (×5): 194.4 mg via ORAL
  Filled 2022-11-17 (×5): qty 6

## 2022-11-17 MED ORDER — ENOXAPARIN SODIUM 40 MG/0.4ML IJ SOSY: 40.0000 mg | PREFILLED_SYRINGE | INTRAMUSCULAR | Status: DC

## 2022-11-17 MED ORDER — ALBUTEROL SULFATE (2.5 MG/3ML) 0.083% IN NEBU: 2.5000 mg | INHALATION_SOLUTION | Freq: Four times a day (QID) | RESPIRATORY_TRACT | Status: DC | PRN

## 2022-11-17 MED ORDER — CALCIUM GLUCONATE-NACL 1-0.675 GM/50ML-% IV SOLN
1.0000 g | Freq: Once | INTRAVENOUS | Status: AC
  Administered 2022-11-17: 1000 mg via INTRAVENOUS
  Filled 2022-11-17: qty 50

## 2022-11-17 MED ORDER — METHOCARBAMOL 1000 MG/10ML IJ SOLN
1000.0000 mg | Freq: Once | INTRAVENOUS | Status: AC
  Administered 2022-11-17: 1000 mg via INTRAVENOUS
  Filled 2022-11-17: qty 10

## 2022-11-17 MED ORDER — ZONISAMIDE 25 MG PO CAPS
150.0000 mg | ORAL_CAPSULE | Freq: Two times a day (BID) | ORAL | Status: DC
  Administered 2022-11-17 – 2022-11-22 (×10): 150 mg via ORAL
  Filled 2022-11-17 (×12): qty 2

## 2022-11-17 MED ORDER — HYDROCODONE-ACETAMINOPHEN 5-325 MG PO TABS: 1.0000 | ORAL_TABLET | Freq: Four times a day (QID) | ORAL | Status: DC | PRN

## 2022-11-17 MED ORDER — GABAPENTIN 300 MG PO CAPS
600.0000 mg | ORAL_CAPSULE | Freq: Every morning | ORAL | Status: DC
  Administered 2022-11-18 – 2022-11-22 (×5): 600 mg via ORAL
  Filled 2022-11-17 (×5): qty 2

## 2022-11-17 MED ORDER — SODIUM CHLORIDE 0.9 % IV SOLN: INTRAVENOUS | Status: DC
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
## 2022-11-17 NOTE — ED Provider Notes (Signed)
[facility] Provider Note   CSN: [id number] Arrival date & time: 11/17/22  1400     History  Chief Complaint  Patient presents with   Level 2    Richard Glass is a 52 y.o. male.  HPI Patient presents as a level 2 trauma via EMS.  Patient was clearing a field when a piece of a tree broke free and struck him in the right hip.  He has been nonambulatory with severe pain in the right hip since the event.  He did fall, strike his head, but had no loss of consciousness, has no complaints of new weakness in any extremity.  Though he has pain in the right hip he has no loss of sensation in the distal right leg. Has a history of right hip repair 3 years ago.    Home Medications Prior to Admission medications   Medication Sig Start Date End Date Taking? Authorizing Provider  ALPRAZolam [name]) 1 MG tablet Take 1/2 tablet 3 times daily as needed for tremor 03/14/22   [name], NP  Calcium Carb-Cholecalciferol ([employer] CALCIUM + D3) 600-800 MG-UNIT TABS Take 1 tablet by mouth 2 (two) times daily before a meal.    [provider]  cetirizine (ZYRTEC) 10 MG tablet Take 10 mg by mouth daily. 11/09/21   [provider]  diclofenac Sodium (VOLTAREN) 1 % GEL Apply 2 g topically 4 (four) times daily. 10/10/20   [name], PA-C  fluticasone (FLONASE) 50 MCG/ACT nasal spray PLACE 2 SPRAYS IN EACH NOSTRIL EVERY DAY Patient taking differently: Place 2 sprays into both nostrils daily as needed for allergies. 05/04/19   [name], [name], MD  gabapentin (NEURONTIN) 600 MG tablet Take 2 tablets (1,200 mg total) by mouth 3 (three) times daily. Patient taking differently: Take by mouth. Takes 1 tablet in the morning and 2 tablets at bedtime. 04/06/19   [name], [name], MD  Galcanezumab-gnlm (EMGALITY) 120 MG/ML SOAJ Inject 1 pen  into the skin every 30 (thirty) days. 10/02/22   [name], NP  hydrOXYzine (ATARAX/VISTARIL) 25 MG tablet  Take 25 mg by mouth 3 (three) times daily as needed. 02/19/21   [provider]  methocarbamol (ROBAXIN) 500 MG tablet Take 2 tablets (1,000 mg total) by mouth 2 (two) times daily. 05/01/21   [name], DO  Multiple Vitamin (MULTIVITAMIN) tablet Take 1 tablet by mouth daily.    [provider]  Multiple Vitamins-Minerals (CENTRUM SILVER PO) Take 1 each by mouth daily.    [provider]  oxyCODONE-acetaminophen (PERCOCET) 10-325 MG tablet Take 1 tablet by mouth every 4 (four) hours as needed for severe pain.    [provider]  PHENobarbital (LUMINAL) 64.8 MG tablet TAKE 3 TABLETS (194.4 MG TOTAL) BY MOUTH AT BEDTIME. 06/25/22   [name], NP  potassium chloride 20 MEQ TBCR Take 20 mEq by mouth daily for 3 days. 05/01/21 12/27/21  [name], DO  rizatriptan (MAXALT) 10 MG tablet TAKE 1 TABLET BY MOUTH AS NEEDED FOR MIGRAINE. MAY REPEAT IN 2 HOURS IF NEEDED 10/02/22   [name], NP  zonisamide (ZONEGRAN) 100 MG capsule Take 1 capsule (100 mg total) by mouth 2 (two) times daily. 10/02/22   [name], NP  zonisamide (ZONEGRAN) 50 MG capsule Take 1 capsule (50 mg total) by mouth in the morning and at bedtime. Take along with the 100 mg capsule twice daily 10/02/22  [name], NP      Allergies    Ibuprofen and No known allergies    Review of Systems   Review of Systems  All other systems reviewed and are negative.   Physical Exam Updated Vital Signs BP (!) 148/90   Pulse 63   Temp (!) 97.4 F (36.3 C) (Temporal)   Resp (!) 30   Ht '5\' 8"'$  (1.727 m)   Wt 79.4 kg   SpO2 100%   BMI 26.61 kg/m  Physical Exam Vitals and nursing note reviewed.  Constitutional:      General: He is not in acute distress.    Appearance: He is well-developed.  HENT:     Head: Normocephalic.   Eyes:     Conjunctiva/sclera: Conjunctivae normal.  Cardiovascular:     Rate and Rhythm: Regular rhythm. Tachycardia present.     Pulses: Normal pulses.   Pulmonary:     Effort: Pulmonary effort is normal. No respiratory distress.     Breath sounds: No stridor.  Abdominal:     General: There is no distension.  Musculoskeletal:       Legs:  Skin:    General: Skin is warm and dry.  Neurological:     Mental Status: He is alert and oriented to person, place, and time.     ED Results / Procedures / Treatments   Labs (all labs ordered are listed, but only abnormal results are displayed) Labs Reviewed  CBC - Abnormal; Notable for the following components:      Result Value   WBC 10.6 (*)    All other components within normal limits  I-STAT CHEM 8, ED - Abnormal; Notable for the following components:   Potassium 3.4 (*)    Calcium, Ion 1.05 (*)    All other components within normal limits  ETHANOL  COMPREHENSIVE METABOLIC PANEL  URINALYSIS, ROUTINE W REFLEX MICROSCOPIC  LACTIC ACID, PLASMA  PROTIME-INR  SAMPLE TO BLOOD BANK    EKG None  Radiology CT CHEST ABDOMEN PELVIS W CONTRAST  Result Date: 11/17/2022 CLINICAL DATA:  Trauma EXAM: CT CHEST, ABDOMEN, AND PELVIS WITH CONTRAST TECHNIQUE: Multidetector CT imaging of the chest, abdomen and pelvis was performed following the standard protocol during bolus administration of intravenous contrast. RADIATION DOSE REDUCTION: This exam was performed according to the departmental dose-optimization program which includes automated exposure control, adjustment of the mA and/or kV according to patient size and/or use of iterative reconstruction technique. CONTRAST:  58m OMNIPAQUE IOHEXOL 350 MG/ML SOLN COMPARISON:  CT abdomen pelvis, 06/20/2009 FINDINGS: CT CHEST FINDINGS Cardiovascular: Aortic atherosclerosis. Normal heart size. No pericardial effusion. Mediastinum/Nodes: No enlarged mediastinal, hilar, or axillary lymph nodes. Thyroid gland, trachea, and esophagus demonstrate no significant findings. Lungs/Pleura: Moderate centrilobular emphysema. Mild diffuse bilateral bronchial wall  thickening. No pleural effusion or pneumothorax. Musculoskeletal: No chest wall abnormality. No acute osseous findings. CT ABDOMEN PELVIS FINDINGS Hepatobiliary: No solid liver abnormality is seen. No gallstones, gallbladder wall thickening, or biliary dilatation. Pancreas: Unremarkable. No pancreatic ductal dilatation or surrounding inflammatory changes. Spleen: Normal in size without significant abnormality. Adrenals/Urinary Tract: Adrenal glands are unremarkable. Simple, benign bilateral renal cortical cysts, for which no further follow-up or characterization is required kidneys are otherwise normal, without renal calculi, solid lesion, or hydronephrosis. Bladder is unremarkable. Stomach/Bowel: Stomach is within normal limits. Appendix appears normal. No evidence of bowel wall thickening, distention, or inflammatory changes. Pancolonic diverticulosis. Vascular/Lymphatic: Aortic atherosclerosis. No enlarged abdominal or pelvic lymph nodes. Reproductive: No mass or other abnormality.  Other: No abdominal wall hernia or abnormality. No ascites. Musculoskeletal: Comminuted periprosthetic fractures about the femoral component of right hip total arthroplasty, incompletely imaged (series 4, image 135). IMPRESSION: 1. Comminuted periprosthetic fractures about the femoral component of right hip total arthroplasty, incompletely imaged. 2. No other evidence of acute traumatic injury to the chest, abdomen, or pelvis. 3. Emphysema and mild diffuse bilateral bronchial wall thickening. 4. Pancolonic diverticulosis without evidence of acute diverticulitis. Aortic Atherosclerosis (ICD10-I70.0) and Emphysema (ICD10-J43.9). Electronically Signed   By: Delanna Ahmadi M.D.   On: 11/17/2022 15:12   CT HEAD WO CONTRAST  Result Date: 11/17/2022 CLINICAL DATA:  Trauma EXAM: CT HEAD WITHOUT CONTRAST CT CERVICAL SPINE WITHOUT CONTRAST TECHNIQUE: Multidetector CT imaging of the head and cervical spine was performed following the standard  protocol without intravenous contrast. Multiplanar CT image reconstructions of the cervical spine were also generated. RADIATION DOSE REDUCTION: This exam was performed according to the departmental dose-optimization program which includes automated exposure control, adjustment of the mA and/or kV according to patient size and/or use of iterative reconstruction technique. COMPARISON:  08/30/2020 FINDINGS: CT HEAD FINDINGS Brain: No evidence of acute infarction, hemorrhage, hydrocephalus, extra-axial collection or mass lesion/mass effect. Cortical encephalomalacia of the left hemisphere with volume loss and asymmetric dilatation of the left lateral ventricle. Vascular: No hyperdense vessel or unexpected calcification. Skull: Left temporoparietal left occipital, and right parietal craniotomies. Negative for fracture or focal lesion. Sinuses/Orbits: No acute finding. Other: None. CT CERVICAL SPINE FINDINGS Alignment: Normal. Skull base and vertebrae: No acute fracture. No primary bone lesion or focal pathologic process. Soft tissues and spinal canal: No prevertebral fluid or swelling. No visible canal hematoma. Disc levels: Mild multilevel disc space height loss and osteophytosis. Upper chest: Negative. Other: None. IMPRESSION: 1. No acute intracranial pathology. 2. 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 3. Left temporoparietal left occipital, and right parietal craniotomies. 4. No fracture or static subluxation of the cervical spine. Electronically Signed   By: Delanna Ahmadi M.D.   On: 11/17/2022 15:01   CT CERVICAL SPINE WO CONTRAST  Result Date: 11/17/2022 CLINICAL DATA:  Trauma EXAM: CT HEAD WITHOUT CONTRAST CT CERVICAL SPINE WITHOUT CONTRAST TECHNIQUE: Multidetector CT imaging of the head and cervical spine was performed following the standard protocol without intravenous contrast. Multiplanar CT image reconstructions  of the cervical spine were also generated. RADIATION DOSE REDUCTION: This exam was performed according to the departmental dose-optimization program which includes automated exposure control, adjustment of the mA and/or kV according to patient size and/or use of iterative reconstruction technique. COMPARISON:  08/30/2020 FINDINGS: CT HEAD FINDINGS Brain: No evidence of acute infarction, hemorrhage, hydrocephalus, extra-axial collection or mass lesion/mass effect. Cortical encephalomalacia of the left hemisphere with volume loss and asymmetric dilatation of the left lateral ventricle. Vascular: No hyperdense vessel or unexpected calcification. Skull: Left temporoparietal left occipital, and right parietal craniotomies. Negative for fracture or focal lesion. Sinuses/Orbits: No acute finding. Other: None. CT CERVICAL SPINE FINDINGS Alignment: Normal. Skull base and vertebrae: No acute fracture. No primary bone lesion or focal pathologic process. Soft tissues and spinal canal: No prevertebral fluid or swelling. No visible canal hematoma. Disc levels: Mild multilevel disc space height loss and osteophytosis. Upper chest: Negative. Other: None. IMPRESSION: 1. No acute intracranial pathology. 2. 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 3. Left temporoparietal left occipital, and right parietal craniotomies. 4. No  fracture or static subluxation of the cervical spine. Electronically Signed   By: Delanna Ahmadi M.D.   On: 11/17/2022 15:01   DG Pelvis Portable  Result Date: 11/17/2022 CLINICAL DATA:  Fall from tree.  Pain after trauma EXAM: RIGHT FEMUR PORTABLE 1 VIEW; PORTABLE PELVIS 1-2 VIEWS COMPARISON:  Pelvis x-ray 08/30/2020 FINDINGS: Limited pelvis x-ray did rotation overlapping artifacts. There is a right hip arthroplasty. Fixation hardware as well along the left sacroiliac joint with screws. Of the pelvis there is no obvious  fracture or dislocation. Mild degenerative changes of the right sacroiliac joint. Preserved left hip joint. However there is a fracture which is angulated centered at the tip of the femoral component of the Press-Fit femoral stem. Apex lateral angulation of the oblique fracture. Screw fixated acetabular cup. No additional fracture or dislocation of the femur on these limited views. There are no true AP views obtained throughout the midshaft of the femur or distal. IMPRESSION: Limited x-rays. Oblique mildly displaced and angulated fracture of the proximal femoral shaft centered of the tip of the femoral component of the right hip arthroplasty. Fixation screws along the left sacroiliac joint. Electronically Signed   By: Jill Side M.D.   On: 11/17/2022 14:32   DG FEMUR PORT, 1V RIGHT  Result Date: 11/17/2022 CLINICAL DATA:  Fall from tree.  Pain after trauma EXAM: RIGHT FEMUR PORTABLE 1 VIEW; PORTABLE PELVIS 1-2 VIEWS COMPARISON:  Pelvis x-ray 08/30/2020 FINDINGS: Limited pelvis x-ray did rotation overlapping artifacts. There is a right hip arthroplasty. Fixation hardware as well along the left sacroiliac joint with screws. Of the pelvis there is no obvious fracture or dislocation. Mild degenerative changes of the right sacroiliac joint. Preserved left hip joint. However there is a fracture which is angulated centered at the tip of the femoral component of the Press-Fit femoral stem. Apex lateral angulation of the oblique fracture. Screw fixated acetabular cup. No additional fracture or dislocation of the femur on these limited views. There are no true AP views obtained throughout the midshaft of the femur or distal. IMPRESSION: Limited x-rays. Oblique mildly displaced and angulated fracture of the proximal femoral shaft centered of the tip of the femoral component of the right hip arthroplasty. Fixation screws along the left sacroiliac joint. Electronically Signed   By: Jill Side M.D.   On: 11/17/2022 14:32    DG Chest Port 1 View  Result Date: 11/17/2022 CLINICAL DATA:  Pain after trauma.  Fall from tree EXAM: PORTABLE CHEST 1 VIEW COMPARISON:  X-ray 05/01/2021 FINDINGS: The left costophrenic angle is clipped off the edge of the film. No pneumothorax, effusion or edema. Enlarged cardiopericardial silhouette. This could be technical with underinflation and portable technique. Film is also rotated. IMPRESSION: Limited x-ray.  No acute cardiopulmonary disease. Electronically Signed   By: Jill Side M.D.   On: 11/17/2022 14:29    Procedures Procedures    Medications Ordered in ED Medications  fentaNYL (SUBLIMAZE) 50 MCG/ML injection (  Canceled Entry 11/17/22 1410)  fentaNYL (SUBLIMAZE) injection (50 mcg Intravenous Given 11/17/22 1405)  methocarbamol (ROBAXIN) injection 1,000 mg (has no administration in time range)  HYDROmorphone (DILAUDID) injection 1 mg (has no administration in time range)  ondansetron (ZOFRAN) 4 MG/2ML injection (  Given 11/17/22 1407)  HYDROmorphone (DILAUDID) injection 1 mg (1 mg Intravenous Given 11/17/22 1418)  iohexol (OMNIPAQUE) 350 MG/ML injection 75 mL (75 mLs Intravenous Contrast Given 11/17/22 1441)  HYDROmorphone (DILAUDID) injection 1 mg (1 mg Intravenous Given 11/17/22 1521)  ED Course/ Medical Decision Making/ A&P                             Medical Decision Making Patient with multiple medical problems including prior orthopedic procedures, prior craniotomy presents after level 2 trauma with blunt force injury. Concern for intra-abdominal or thoracic cranial injuries versus fractures or soft tissue injury. Patient had CT head, neck, chest abdomen pelvis, continuous cardiac monitoring, multiple doses of IV narcotics, was found to have comminuted periprosthetic right femur fracture.  Patient required placement in Buck's traction.  This was performed by myself, orthopedic tech, nursing staff.  I discussed this case with orthopedic team and the patient will be admitted  to the hospitalist team.  Amount and/or Complexity of Data Reviewed Independent Historian: EMS Labs: ordered. Decision-making details documented in ED Course. Radiology: ordered and independent interpretation performed. Decision-making details documented in ED Course. Discussion of management or test interpretation with external provider(s): Case discussed with orthopedics, x 2, Dr. Laurance Flatten from his primary care Ortho team will follow as a consulting service we discussed the x-rays specifically.  Risk Prescription drug management. Decision regarding hospitalization.  Final Clinical Impression(s) / ED Diagnoses Final diagnoses:  Trauma  Closed fracture of right hip requiring operative repair, initial encounter [facility])     Carmin Muskrat, MD 11/17/22 1536

## 2022-11-17 NOTE — ED Notes (Signed)
Headed to CT

## 2022-11-17 NOTE — ED Notes (Signed)
Xray at beside

## 2022-11-17 NOTE — ED Notes (Signed)
Ortho at bedside preparing to apply [facility] Traction with EDP.

## 2022-11-17 NOTE — Plan of Care (Signed)
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
## 2022-11-17 NOTE — H&P (Signed)
History and Physical    Patient: Richard Glass [id number] DOB: [birth year] DOA: 11/17/2022 DOS: the patient was seen and examined on 11/17/2022 PCP: Riki Sheer, NP  Patient coming from: via EMS  Chief Complaint:  Chief Complaint  Patient presents with   Level 2   HPI: Richard Glass is a 52 y.o. 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  Patient states that they were outside trying to take the roof off of a big dog lodge with an their tractor they were using hit a tree causing it to fall.  Patient states that the tree and him and his head, back, and leg causing him to be knocked to the ground.  He was unable to get up he reported having severe pain out of his right hip.  He denied any loss of consciousness, but notes that he had total right hip replacement by Dr. Erlinda Hong previously on 11/2019.  Patient makes note that he got hit in the head with a golf ball at the age of 1 that caused intercranial bleeding for which he had to have bur holes placed for which he has a history of seizure disorder.  He reports that he has not had any seizures in over a year.  In the emergency department patient was noted to be afebrile with respirations 10-30, and all other vital signs maintained.  Labs significant for WBC 10.6, calcium 8.6, and lactic acid 2.7. X-rays significant for a oblique mildly displaced and angulated fracture of the proximal femoral shaft centered at the tip of the femoral component of the right hip arthroplasty, and question of a subtle right lateral tibial plateau fracture with small joint effusion.   CT scan of the head, cervical spine, chest, abdomen, and pelvis have been obtained that did not note any other acute abnormality.  Dr. Laurance Flatten of orthopedics has been consulted to follow along with the patient.    Review of Systems: As mentioned in the history of present  illness. All other systems reviewed and are negative. 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   Allergic rhinitis 11/13/2006        Anxiety    Anxiety state 08/08/2013   Asthma    ASTHMA, INTERMITTENT 07/08/2010   Qualifier: Diagnosis of  By: Walker Kehr MD, Wayne     Back pain of lumbar region with sciatica 11/13/2006   MRI 12/2014 - lumbar DDD without focal neural impingement  MRI Lumbar 10/30/16 ([place]) Small central L5-S1 disc extrusion with minimal cranial migration and associated annular fissure approaches descending left S1 nerve roots without contact or displacement. Minimal bulging disc height loss without spinal canal stenosis or neural foraminal narrowing.  Minimal bulging disc at L2-L3 through L4-L5 without spinal canal stenosis or neural foraminal narrowing.  MRI Sacrum 10/30/16 ([place]) No fracture. Small lesion left sacral ala most likely a subchondral cyst/erosion adjacent to the sacroiliac joint.      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 The craniocervical junction is normal and cervical cord signal is normal. The central spinal canal and neural foramina are widely patent  at all levels. Scattered, mild degenerative change appears most notable at C4-5. Imaged paraspinous structures are unremarkable.   IMPRESSION: Negative for central canal or foraminal narrowing. No finding to explain the patient's symptoms. Scattered, mild facet degenerative disease is noted.    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   Morton's neuroma of left foot 01/18/2016   Osteopenia 10/10/2016   Overweight(278.02) 06/13/2009   Qualifier: Diagnosis of  By: Hedy Camara     Pain in joint, shoulder region 07/05/2014   LEFT > Right Reports hx rotator cuff surgery on rigt shoulder previously (Dr Nicholes Stairs) but no notes available XRAY right shoulder showed some proliferative changes distal rigt clavicle    Rash and nonspecific skin eruption 10/10/2016   Restless leg 09/29/2007   Qualifier: Diagnosis of  By: Walker Kehr MD, Wayne     Restless legs syndrome (RLS)    Sciatica of left side 10/26/2014   Seizures (HCC)    last >7yr   SI (sacroiliac) pain 03/20/2017   Sinusitis chronic, frontal    Status post lumbar spinal fusion 03/20/2017   Subacromial or subdeltoid bursitis 08/08/2009   MRI C-spine 2011([facility]) - normal MRI left shoulder 10/2010 ([facility]) - AC degenerative disease, normal rotator cuff 12/2012 -debridement, acromioplasty and distal clavicle excision of left shoulder, arthroscopy also performed an no need for rotator cuff repair - performed by [facility] 02/2013 - Nerve Conduction Studies ([facility]) - ulnar nerve compression 02/2013- taken back to OR for Ulnar nerve decompression 06/2013 Repeat MRI left shoulder -  subacromial/subdeltoid bursitis, a small intersubstance tear to the distal infraspinatus and evidence of interval resection of the distal clavicle 06/2013 - steroid injection of left biceps 09/2013 -Repeat EMG showed improvement of ulnar nerve compression 10[facility]2013 [facility] to 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) found 2 small tubular adenomas (3 mm & 4 mm) - recommend follow up surveillance colonoscopy in 7 years.    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   Presumed surgery June 2014    Past Surgical History:  [place]cedure Laterality Date   [place]-52 yrs old   [place]   HARDWARE REMOVAL Right 11/22/2019   Procedure: HARDWARE REMOVAL;  Surgeon: XLeandrew Koyanagi MD;  Location: [place];  Service: Orthopedics;  Laterality: Right;   HIP PINNING,CANNULATED Right 06/27/2019   Procedure: HIP PERCUTANEOUS PINNING;  Surgeon: Meredith Pel, MD;  Location: [place];  Service: Orthopedics;  Laterality: Right;   NASAL FRACTURE SURGERY  1997   SACROILIAC JOINT FUSION Left 03/20/2017   Procedure: SACROILIAC JOINT FUSION;  Surgeon: Melina Schools, MD;  Location: [place];  Service: Orthopedics;  Laterality: Left;  90 mins   SHOULDER ARTHROSCOPY Left    "cleaned arthritis out"   SHOULDER SURGERY Right 2012   Rotator cuff surgery   TONSILLECTOMY     TOTAL HIP ARTHROPLASTY Right 11/22/2019   Procedure: CONVERSION OF PREVIOUS RIGHT HIP SURGERY TO TOTAL HIP ARTHROPLASTY ANTERIOR APPROACH, HARDWARE REMOVAL;  Surgeon: Leandrew Koyanagi, MD;  Location: [place];  Service: Orthopedics;  Laterality: Right;   UVULOPALATOPHARYNGOPLASTY  (UPPP)/TONSILLECTOMY/SEPTOPLASTY  2001   Social History:  reports that he quit smoking about 10 years ago. His smoking use included cigarettes. He has a 22.00 pack-year smoking history. His smokeless tobacco use includes snuff. He reports that he does not drink alcohol and does not use drugs.  Allergies  Allergen Reactions   Ibuprofen Diarrhea    Per patient also burns stomach    No Known Allergies     Family History  Problem Relation Age of Onset   Hypertension Mother    Cancer Father        lung   Heart disease Father    Seizures Neg Hx    Colon cancer Neg Hx    Colon polyps Neg Hx    Esophageal cancer Neg Hx    Stomach cancer Neg Hx    Rectal cancer Neg Hx     Prior to Admission medications   Medication Sig Start Date End Date Taking? Authorizing Provider  ALPRAZolam [name]) 1 MG tablet Take 1/2 tablet 3 times daily as needed for tremor 03/14/22   [name], NP  Calcium Carb-Cholecalciferol ([employer] CALCIUM + D3) 600-800 MG-UNIT TABS Take 1 tablet by mouth 2 (two) times daily before a meal.    [provider]  cetirizine (ZYRTEC) 10 MG tablet Take 10 mg by mouth daily. 11/09/21   [provider]  diclofenac Sodium (VOLTAREN) 1 % GEL Apply 2 g topically 4 (four) times daily. 10/10/20   [name], PA-C  fluticasone (FLONASE) 50 MCG/ACT nasal spray PLACE 2 SPRAYS IN EACH NOSTRIL EVERY DAY Patient taking differently: Place 2 sprays into both nostrils daily as needed for allergies. 05/04/19   [name], [name], MD  gabapentin (NEURONTIN) 600 MG tablet Take 2 tablets (1,200 mg total) by mouth 3 (three) times daily. Patient taking differently: Take by mouth. Takes 1 tablet in the morning and 2 tablets at bedtime. 04/06/19   [name], [name], MD  Galcanezumab-gnlm (EMGALITY) 120 MG/ML SOAJ Inject 1 pen  into the skin every 30 (thirty) days. 10/02/22   [name], NP  hydrOXYzine (ATARAX/VISTARIL) 25 MG tablet Take 25 mg by mouth 3 (three) times daily as  needed. 02/19/21   [provider]  methocarbamol (ROBAXIN) 500 MG tablet Take 2 tablets (1,000 mg total) by mouth 2 (two) times daily. 05/01/21   [name], DO  Multiple Vitamin (MULTIVITAMIN) tablet Take 1 tablet by mouth daily.    [provider]  Multiple Vitamins-Minerals (CENTRUM SILVER PO) Take 1 each by mouth daily.    [provider]  oxyCODONE-acetaminophen (PERCOCET) 10-325 MG tablet Take 1 tablet by mouth every  4 (four) hours as needed for severe pain.    [provider]  PHENobarbital (LUMINAL) 64.8 MG tablet TAKE 3 TABLETS (194.4 MG TOTAL) BY MOUTH AT BEDTIME. 06/25/22   [name], NP  potassium chloride 20 MEQ TBCR Take 20 mEq by mouth daily for 3 days. 05/01/21 12/27/21  [name], DO  rizatriptan (MAXALT) 10 MG tablet TAKE 1 TABLET BY MOUTH AS NEEDED FOR MIGRAINE. MAY REPEAT IN 2 HOURS IF NEEDED 10/02/22   [name], NP  zonisamide (ZONEGRAN) 100 MG capsule Take 1 capsule (100 mg total) by mouth 2 (two) times daily. 10/02/22   [name], NP  zonisamide (ZONEGRAN) 50 MG capsule Take 1 capsule (50 mg total) by mouth in the morning and at bedtime. Take along with the 100 mg capsule twice daily 10/02/22   [name], NP    Physical Exam: Vitals:   11/17/22 1505 11/17/22 1515 11/17/22 1530 11/17/22 1545  BP:  (!) 148/90 (!) 146/78 138/69  Pulse:  63 (!) 58 (!) 57  Resp:  (!) 30 (!) 22 13  Temp:      TempSrc:      SpO2:  100% 95% 99%  Weight: 79.4 kg     Height: '5\' 8"'$  (1.727 m)      Exam  Constitutional: Elderly age male currently in no acute distress Eyes: PERRL, lids and conjunctivae normal ENMT: Mucous membranes are moist. Posterior pharynx clear of any exudate or lesions.  Neck: normal, supple, no masses,   Respiratory: clear to auscultation bilaterally, no wheezing, no crackles. Normal respiratory effort.   Cardiovascular: Regular rate and rhythm, no murmurs / rubs / gallops  Abdomen: no tenderness, no masses  palpated. No hepatosplenomegaly. Bowel sounds positive.  Musculoskeletal: no clubbing / cyanosis. No joint deformity upper and lower extremities. Good ROM, no contractures. Normal muscle tone.  Skin: no rashes, lesions, ulcers.  Neurologic: CN 2-12 grossly intact. Sensation intact.  Able to move all extremities Psychiatric: Normal judgment and insight. Alert and oriented x 3. Normal mood.   Data Reviewed:  EKG reveals sinus rhythm at 64 bpm. Reviewed labs, imaging, and pertinent records as noted above in HPI  Assessment and Plan:  Right periprosthetic hip fracture Acute.  Patient was clearing a field when a tree broke free and struck him in the right hip.  X-rays reveal communicated periprosthetic right femur fracture.  He had been placed in Buck's traction.  Dr. Laurance Flatten of orthopedics have been consulted. -Admit to a surgical telemetry bed -Hip fracture order set utilized -N.p.o. after midnight -Oxycodone/Dilaudid IV as needed for pain -Appreciate orthopedic consultative services, will follow-up for any further recommendations  Lactic acidosis Acute.  Initial lactic acid elevated 2.7.   -Normal saline IV fluids -Recheck  Hypocalcemia Acute.  Initial calcium 8.6 with ionized calcium 1.05. -Give 1 g of calcium gluconate IV -Team to monitor and replace as needed  Seizure disorder History of traumatic brain injury -Continue current antiseizure medication regimen   DVT prophylaxis: Lovenox Advance Care Planning:   Code Status: Full Code    Consults: Orthopedics  Family Communication: none requested  Severity of Illness: The appropriate patient status for this patient is INPATIENT. Inpatient status is judged to be reasonable and necessary in order to provide the required intensity of service to ensure the patient's safety. The patient's presenting symptoms, physical exam findings, and initial radiographic and laboratory data in the context of their chronic comorbidities is felt  to place them at  high risk for further clinical deterioration. Furthermore, it is not anticipated that the patient will be medically stable for discharge from the hospital within 2 midnights of admission.   * I certify that at the point of admission it is my clinical judgment that the patient will require inpatient hospital care spanning beyond 2 midnights from the point of admission due to high intensity of service, high risk for further deterioration and high frequency of surveillance required.*  Author: Norval Morton, MD 11/17/2022 4:04 PM  For on call review [email or web address].

## 2022-11-17 NOTE — Progress Notes (Signed)
Received consult from my partner, Dr. Laurance Flatten.  I will plan to perform ORIF of periprosthetic fracture with possible revision femoral stem tomorrow.  He can have a diet today.  I have placed NPO after midnight orders.  I have discontinued order for lovenox for impending surgery.  Full consult tomorrow.    Azucena Cecil, MD [facility] 5:52 PM

## 2022-11-17 NOTE — ED Triage Notes (Signed)
Pt BIB [facility] as Level 2 d/t tree falling on him. Pt states it was more "dead" of a tree than he thought before beginning to cut it down. Then EMS reports it was 2 ft in diameter & he was found on his Lt side, endorses hitting head on falling limbs, deformity seen to Rt hip. EMS reports decreased sensation to his feet, A/Ox4, GCS 15, rates pain 10/10. 20g PIV Rt AC, 50 Dent given & 500cc NS bolus admn while en route to ED.

## 2022-11-17 NOTE — Progress Notes (Signed)
   11/17/22 1600  Spiritual Encounters  Type of Visit Initial  Care provided to: Patient  Conversation partners present during encounter Nurse  Referral source Trauma page  Reason for visit Trauma  OnCall Visit Yes   Ch responded trauma call. No family at bedside. No follow-up needed at this time.

## 2022-11-18 ENCOUNTER — Encounter (HOSPITAL_COMMUNITY)
Admission: EM | Disposition: A | Discharge status: Skilled Nursing Facility | Payer: Self-pay | Source: Home / Self Care | Attending: Internal Medicine

## 2022-11-18 ENCOUNTER — Inpatient Hospital Stay (HOSPITAL_COMMUNITY): Payer: 59 | Admitting: Certified Registered Nurse Anesthetist

## 2022-11-18 ENCOUNTER — Encounter (HOSPITAL_COMMUNITY): Payer: Self-pay | Admitting: Internal Medicine

## 2022-11-18 ENCOUNTER — Inpatient Hospital Stay (HOSPITAL_COMMUNITY): Payer: 59

## 2022-11-18 ENCOUNTER — Other Ambulatory Visit: Payer: Self-pay

## 2022-11-18 DIAGNOSIS — Z96641 Presence of right artificial hip joint: Secondary | ICD-10-CM

## 2022-11-18 DIAGNOSIS — M978XXA Periprosthetic fracture around other internal prosthetic joint, initial encounter: Secondary | ICD-10-CM | POA: Diagnosis not present

## 2022-11-18 DIAGNOSIS — S82121A Displaced fracture of lateral condyle of right tibia, initial encounter for closed fracture: Secondary | ICD-10-CM

## 2022-11-18 DIAGNOSIS — M9701XA Periprosthetic fracture around internal prosthetic right hip joint, initial encounter: Secondary | ICD-10-CM

## 2022-11-18 HISTORY — PX: ORIF PERIPROSTHETIC FRACTURE: SHX5034

## 2022-11-18 LAB — POCT I-STAT 7, (LYTES, BLD GAS, ICA,H+H)
Acid-base deficit: 3 mmol/L — ABNORMAL HIGH (ref 0.0–2.0)
Bicarbonate: 23.9 mmol/L (ref 20.0–28.0)
Calcium, Ion: 1.12 mmol/L — ABNORMAL LOW (ref 1.15–1.40)
HCT: 28 % — ABNORMAL LOW (ref 39.0–52.0)
Hemoglobin: 9.5 g/dL — ABNORMAL LOW (ref 13.0–17.0)
O2 Saturation: 99 %
Patient temperature: 37.5
Potassium: 4.6 mmol/L (ref 3.5–5.1)
Sodium: 133 mmol/L — ABNORMAL LOW (ref 135–145)
TCO2: 25 mmol/L (ref 22–32)
pCO2 arterial: 51.4 mmHg — ABNORMAL HIGH (ref 32–48)
pH, Arterial: 7.278 — ABNORMAL LOW (ref 7.35–7.45)
pO2, Arterial: 183 mmHg — ABNORMAL HIGH (ref 83–108)

## 2022-11-18 LAB — CBC
HCT: 32.9 % — ABNORMAL LOW (ref 39.0–52.0)
Hemoglobin: 11.1 g/dL — ABNORMAL LOW (ref 13.0–17.0)
MCH: 31.1 pg (ref 26.0–34.0)
MCHC: 33.7 g/dL (ref 30.0–36.0)
MCV: 92.2 fL (ref 80.0–100.0)
Platelets: 223 10*3/uL (ref 150–400)
RBC: 3.57 MIL/uL — ABNORMAL LOW (ref 4.22–5.81)
RDW: 13.4 % (ref 11.5–15.5)
WBC: 8.6 10*3/uL (ref 4.0–10.5)
nRBC: 0 % (ref 0.0–0.2)

## 2022-11-18 LAB — BASIC METABOLIC PANEL
Anion gap: 8 (ref 5–15)
BUN: 12 mg/dL (ref 6–20)
CO2: 23 mmol/L (ref 22–32)
Calcium: 8.4 mg/dL — ABNORMAL LOW (ref 8.9–10.3)
Chloride: 106 mmol/L (ref 98–111)
Creatinine, Ser: 1.04 mg/dL (ref 0.61–1.24)
GFR, Estimated: >60 mL/min (ref >60)
Glucose, Bld: 119 mg/dL — ABNORMAL HIGH (ref 70–99)
Potassium: 3.7 mmol/L (ref 3.5–5.1)
Sodium: 137 mmol/L (ref 135–145)

## 2022-11-18 LAB — CK: Total CK: 407 U/L — ABNORMAL HIGH (ref 49–397)

## 2022-11-18 LAB — MRSA NEXT GEN BY PCR, NASAL: MRSA by PCR Next Gen: NOT DETECTED

## 2022-11-18 SURGERY — OPEN REDUCTION INTERNAL FIXATION (ORIF) PERIPROSTHETIC FRACTURE
Anesthesia: General | Laterality: Right

## 2022-11-18 MED ORDER — 0.9 % SODIUM CHLORIDE (POUR BTL) OPTIME
TOPICAL | Status: DC | PRN
  Administered 2022-11-18: 4000 mL

## 2022-11-18 MED ORDER — ORAL CARE MOUTH RINSE: 15.0000 mL | Freq: Once | OROMUCOSAL | Status: AC

## 2022-11-18 MED ORDER — LACTATED RINGERS IV SOLN: INTRAVENOUS | Status: DC

## 2022-11-18 MED ORDER — TRANEXAMIC ACID-NACL 1000-0.7 MG/100ML-% IV SOLN
1000.0000 mg | INTRAVENOUS | Status: AC
  Administered 2022-11-18: 1000 mg via INTRAVENOUS

## 2022-11-18 MED ORDER — FENTANYL CITRATE (PF) 250 MCG/5ML IJ SOLN
INTRAMUSCULAR | Status: AC
  Filled 2022-11-18: qty 5

## 2022-11-18 MED ORDER — OXYCODONE HCL 5 MG/5ML PO SOLN: 5.0000 mg | Freq: Once | ORAL | Status: DC | PRN

## 2022-11-18 MED ORDER — POVIDONE-IODINE 10 % EX SWAB
2.0000 | Freq: Once | CUTANEOUS | Status: AC
  Administered 2022-11-18: 2 via TOPICAL

## 2022-11-18 MED ORDER — CEFAZOLIN SODIUM-DEXTROSE 2-4 GM/100ML-% IV SOLN
INTRAVENOUS | Status: AC
  Filled 2022-11-18: qty 100

## 2022-11-18 MED ORDER — VANCOMYCIN HCL 1000 MG IV SOLR
INTRAVENOUS | Status: AC
  Filled 2022-11-18: qty 20

## 2022-11-18 MED ORDER — METHOCARBAMOL 500 MG PO TABS
500.0000 mg | ORAL_TABLET | Freq: Four times a day (QID) | ORAL | Status: DC | PRN
  Administered 2022-11-19: 500 mg via ORAL
  Filled 2022-11-18: qty 1

## 2022-11-18 MED ORDER — SUGAMMADEX SODIUM 200 MG/2ML IV SOLN
INTRAVENOUS | Status: DC | PRN
  Administered 2022-11-18: 200 mg via INTRAVENOUS

## 2022-11-18 MED ORDER — CHLORHEXIDINE GLUCONATE 0.12 % MT SOLN: 15.0000 mL | Freq: Once | OROMUCOSAL | Status: AC

## 2022-11-18 MED ORDER — CHLORHEXIDINE GLUCONATE 0.12 % MT SOLN
OROMUCOSAL | Status: AC
  Administered 2022-11-18: 15 mL
  Filled 2022-11-18: qty 15

## 2022-11-18 MED ORDER — ACETAMINOPHEN 10 MG/ML IV SOLN: 1000.0000 mg | Freq: Once | INTRAVENOUS | Status: DC | PRN

## 2022-11-18 MED ORDER — ROCURONIUM BROMIDE 10 MG/ML (PF) SYRINGE
PREFILLED_SYRINGE | INTRAVENOUS | Status: DC | PRN
  Administered 2022-11-18: 60 mg via INTRAVENOUS
  Administered 2022-11-18 (×2): 10 mg via INTRAVENOUS
  Administered 2022-11-18: 30 mg via INTRAVENOUS

## 2022-11-18 MED ORDER — ONDANSETRON HCL 4 MG/2ML IJ SOLN: 4.0000 mg | Freq: Four times a day (QID) | INTRAMUSCULAR | Status: DC | PRN

## 2022-11-18 MED ORDER — MAGNESIUM CITRATE PO SOLN: 1.0000 | Freq: Once | ORAL | Status: DC | PRN

## 2022-11-18 MED ORDER — ENOXAPARIN SODIUM 40 MG/0.4ML IJ SOSY
40.0000 mg | PREFILLED_SYRINGE | INTRAMUSCULAR | Status: DC
  Administered 2022-11-19: 40 mg via SUBCUTANEOUS
  Filled 2022-11-18: qty 0.4

## 2022-11-18 MED ORDER — ALUM & MAG HYDROXIDE-SIMETH 200-200-20 MG/5ML PO SUSP: 30.0000 mL | ORAL | Status: DC | PRN

## 2022-11-18 MED ORDER — POLYETHYLENE GLYCOL 3350 17 G PO PACK: 17.0000 g | PACK | Freq: Every day | ORAL | Status: DC | PRN

## 2022-11-18 MED ORDER — HYDROMORPHONE HCL 1 MG/ML IJ SOLN
INTRAMUSCULAR | Status: DC | PRN
  Administered 2022-11-18: .5 mg via INTRAVENOUS

## 2022-11-18 MED ORDER — MIDAZOLAM HCL 2 MG/2ML IJ SOLN
INTRAMUSCULAR | Status: DC | PRN
  Administered 2022-11-18: 2 mg via INTRAVENOUS

## 2022-11-18 MED ORDER — VANCOMYCIN HCL 1000 MG IV SOLR
INTRAVENOUS | Status: DC | PRN
  Administered 2022-11-18: 1000 mg via TOPICAL

## 2022-11-18 MED ORDER — PROPOFOL 10 MG/ML IV BOLUS
INTRAVENOUS | Status: DC | PRN
  Administered 2022-11-18: 110 mg via INTRAVENOUS
  Administered 2022-11-18: 30 mg via INTRAVENOUS

## 2022-11-18 MED ORDER — MIDAZOLAM HCL 2 MG/2ML IJ SOLN
INTRAMUSCULAR | Status: AC
  Filled 2022-11-18: qty 2

## 2022-11-18 MED ORDER — CEFAZOLIN SODIUM-DEXTROSE 2-4 GM/100ML-% IV SOLN
2.0000 g | INTRAVENOUS | Status: AC
  Administered 2022-11-18: 2 g via INTRAVENOUS

## 2022-11-18 MED ORDER — ALBUMIN HUMAN 5 % IV SOLN: INTRAVENOUS | Status: DC | PRN

## 2022-11-18 MED ORDER — ONDANSETRON HCL 4 MG/2ML IJ SOLN
INTRAMUSCULAR | Status: DC | PRN
  Administered 2022-11-18: 4 mg via INTRAVENOUS

## 2022-11-18 MED ORDER — OXYCODONE HCL 5 MG PO TABS: 10.0000 mg | ORAL_TABLET | ORAL | Status: DC | PRN

## 2022-11-18 MED ORDER — ACETAMINOPHEN 500 MG PO TABS: 1000.0000 mg | ORAL_TABLET | Freq: Once | ORAL | Status: DC | PRN

## 2022-11-18 MED ORDER — FENTANYL CITRATE (PF) 100 MCG/2ML IJ SOLN
INTRAMUSCULAR | Status: AC
  Filled 2022-11-18: qty 2

## 2022-11-18 MED ORDER — FENTANYL CITRATE (PF) 100 MCG/2ML IJ SOLN
25.0000 ug | INTRAMUSCULAR | Status: DC | PRN
  Administered 2022-11-18: 50 ug via INTRAVENOUS

## 2022-11-18 MED ORDER — METHOCARBAMOL 1000 MG/10ML IJ SOLN: 500.0000 mg | Freq: Four times a day (QID) | INTRAVENOUS | Status: DC | PRN

## 2022-11-18 MED ORDER — TRANEXAMIC ACID-NACL 1000-0.7 MG/100ML-% IV SOLN
1000.0000 mg | Freq: Once | INTRAVENOUS | Status: AC
  Administered 2022-11-18: 1000 mg via INTRAVENOUS
  Filled 2022-11-18: qty 100

## 2022-11-18 MED ORDER — DEXAMETHASONE SODIUM PHOSPHATE 10 MG/ML IJ SOLN
INTRAMUSCULAR | Status: DC | PRN
  Administered 2022-11-18: 10 mg via INTRAVENOUS

## 2022-11-18 MED ORDER — PHENYLEPHRINE 80 MCG/ML (10ML) SYRINGE FOR IV PUSH (FOR BLOOD PRESSURE SUPPORT)
PREFILLED_SYRINGE | INTRAVENOUS | Status: AC
  Filled 2022-11-18: qty 10

## 2022-11-18 MED ORDER — TRANEXAMIC ACID 1000 MG/10ML IV SOLN
2000.0000 mg | INTRAVENOUS | Status: AC
  Administered 2022-11-18: 2000 mg via TOPICAL
  Filled 2022-11-18: qty 20

## 2022-11-18 MED ORDER — LIDOCAINE 2% (20 MG/ML) 5 ML SYRINGE
INTRAMUSCULAR | Status: DC | PRN
  Administered 2022-11-18: 60 mg via INTRAVENOUS

## 2022-11-18 MED ORDER — HYDROMORPHONE HCL 1 MG/ML IJ SOLN
0.5000 mg | INTRAMUSCULAR | Status: DC | PRN
  Administered 2022-11-18: 1 mg via INTRAVENOUS
  Administered 2022-11-19: 0.5 mg via INTRAVENOUS
  Administered 2022-11-19 – 2022-11-20 (×3): 1 mg via INTRAVENOUS
  Filled 2022-11-18 (×6): qty 1

## 2022-11-18 MED ORDER — ACETAMINOPHEN 10 MG/ML IV SOLN
INTRAVENOUS | Status: DC | PRN
  Administered 2022-11-18: 1000 mg via INTRAVENOUS

## 2022-11-18 MED ORDER — MENTHOL 3 MG MT LOZG: 1.0000 | LOZENGE | OROMUCOSAL | Status: DC | PRN

## 2022-11-18 MED ORDER — EPHEDRINE SULFATE-NACL 50-0.9 MG/10ML-% IV SOSY
PREFILLED_SYRINGE | INTRAVENOUS | Status: DC | PRN
  Administered 2022-11-18: 5 mg via INTRAVENOUS

## 2022-11-18 MED ORDER — ONDANSETRON HCL 4 MG PO TABS: 4.0000 mg | ORAL_TABLET | Freq: Four times a day (QID) | ORAL | Status: DC | PRN

## 2022-11-18 MED ORDER — ACETAMINOPHEN 325 MG PO TABS: 325.0000 mg | ORAL_TABLET | Freq: Four times a day (QID) | ORAL | Status: DC | PRN

## 2022-11-18 MED ORDER — PHENYLEPHRINE 80 MCG/ML (10ML) SYRINGE FOR IV PUSH (FOR BLOOD PRESSURE SUPPORT)
PREFILLED_SYRINGE | INTRAVENOUS | Status: DC | PRN
  Administered 2022-11-18 (×7): 160 ug via INTRAVENOUS
  Administered 2022-11-18: 240 ug via INTRAVENOUS

## 2022-11-18 MED ORDER — SORBITOL 70 % SOLN: 30.0000 mL | Freq: Every day | Status: DC | PRN

## 2022-11-18 MED ORDER — DOCUSATE SODIUM 100 MG PO CAPS
100.0000 mg | ORAL_CAPSULE | Freq: Two times a day (BID) | ORAL | Status: DC
  Administered 2022-11-18 – 2022-11-22 (×7): 100 mg via ORAL
  Filled 2022-11-18 (×8): qty 1

## 2022-11-18 MED ORDER — ACETAMINOPHEN 10 MG/ML IV SOLN
INTRAVENOUS | Status: AC
  Filled 2022-11-18: qty 100

## 2022-11-18 MED ORDER — FENTANYL CITRATE (PF) 250 MCG/5ML IJ SOLN
INTRAMUSCULAR | Status: DC | PRN
  Administered 2022-11-18: 100 ug via INTRAVENOUS
  Administered 2022-11-18: 150 ug via INTRAVENOUS

## 2022-11-18 MED ORDER — TRANEXAMIC ACID-NACL 1000-0.7 MG/100ML-% IV SOLN
INTRAVENOUS | Status: AC
  Filled 2022-11-18: qty 100

## 2022-11-18 MED ORDER — HYDROMORPHONE HCL 1 MG/ML IJ SOLN
INTRAMUSCULAR | Status: AC
  Filled 2022-11-18: qty 0.5

## 2022-11-18 MED ORDER — CHLORHEXIDINE GLUCONATE 4 % EX LIQD: 60.0000 mL | Freq: Once | CUTANEOUS | Status: DC

## 2022-11-18 MED ORDER — SODIUM CHLORIDE 0.9 % IV SOLN: INTRAVENOUS | Status: DC

## 2022-11-18 MED ORDER — HYDROMORPHONE HCL 1 MG/ML IJ SOLN
INTRAMUSCULAR | Status: AC
  Filled 2022-11-18: qty 1

## 2022-11-18 MED ORDER — ACETAMINOPHEN 500 MG PO TABS
1000.0000 mg | ORAL_TABLET | Freq: Four times a day (QID) | ORAL | Status: DC
  Administered 2022-11-18 – 2022-11-19 (×2): 1000 mg via ORAL
  Filled 2022-11-18 (×2): qty 2

## 2022-11-18 MED ORDER — ACETAMINOPHEN 160 MG/5ML PO SOLN: 1000.0000 mg | Freq: Once | ORAL | Status: DC | PRN

## 2022-11-18 MED ORDER — PHENYLEPHRINE HCL-NACL 20-0.9 MG/250ML-% IV SOLN
INTRAVENOUS | Status: DC | PRN
  Administered 2022-11-18: 25 ug/min via INTRAVENOUS

## 2022-11-18 MED ORDER — OXYCODONE HCL 5 MG PO TABS: 5.0000 mg | ORAL_TABLET | Freq: Once | ORAL | Status: DC | PRN

## 2022-11-18 MED ORDER — CEFAZOLIN SODIUM-DEXTROSE 2-4 GM/100ML-% IV SOLN
2.0000 g | Freq: Four times a day (QID) | INTRAVENOUS | Status: AC
  Administered 2022-11-18 – 2022-11-19 (×3): 2 g via INTRAVENOUS
  Filled 2022-11-18 (×3): qty 100

## 2022-11-18 MED ORDER — PHENOL 1.4 % MT LIQD: 1.0000 | OROMUCOSAL | Status: DC | PRN

## 2022-11-18 MED ORDER — OXYCODONE HCL 5 MG PO TABS
5.0000 mg | ORAL_TABLET | ORAL | Status: DC | PRN
  Administered 2022-11-19 – 2022-11-20 (×2): 5 mg via ORAL
  Administered 2022-11-21 – 2022-11-22 (×2): 10 mg via ORAL
  Filled 2022-11-18: qty 2
  Filled 2022-11-18: qty 1
  Filled 2022-11-18: qty 2
  Filled 2022-11-18: qty 1

## 2022-11-18 SURGICAL SUPPLY — 85 items
ASMB CBL PN 914X1.8 STRL (Trauma Fixation) ×4 IMPLANT
BAG COUNTER SPONGE SURGICOUNT (BAG) ×2 IMPLANT
BAG SPNG CNTER NS LX DISP (BAG) ×2
BIT DRILL 4.3 (BIT) ×1 IMPLANT
BIT DRILL QC 3.3X195 (BIT) ×1 IMPLANT
BLADE CLIPPER SURG (BLADE) ×2 IMPLANT
BRUSH FEMORAL CANAL (MISCELLANEOUS) IMPLANT
CABLE READY CERCLAGE W/CRIP (Trauma Fixation) ×2 IMPLANT
CAP LOCK NCB (Cap) ×6 IMPLANT
COVER SURGICAL LIGHT HANDLE (MISCELLANEOUS) ×2 IMPLANT
DRAPE HALF SHEET 40X57 (DRAPES) ×4 IMPLANT
DRAPE HIP W/POCKET STRL (MISCELLANEOUS) ×2 IMPLANT
DRAPE INCISE IOBAN 66X45 STRL (DRAPES) ×2 IMPLANT
DRAPE ORTHO SPLIT 77X108 STRL (DRAPES) ×4
DRAPE SURG ORHT 6 SPLT 77X108 (DRAPES) ×4 IMPLANT
DRAPE U-SHAPE 47X51 STRL (DRAPES) ×2 IMPLANT
DRILL BIT 4.3 (BIT) ×2
DRSG AQUACEL AG ADV 3.5X10 (GAUZE/BANDAGES/DRESSINGS) ×1 IMPLANT
DRSG AQUACEL AG ADV 3.5X14 (GAUZE/BANDAGES/DRESSINGS) ×1 IMPLANT
DURAPREP 26ML APPLICATOR (WOUND CARE) ×6 IMPLANT
ELECT BLADE 6.5 EXT (BLADE) IMPLANT
ELECT CAUTERY BLADE 6.4 (BLADE) IMPLANT
ELECT REM PT RETURN 9FT ADLT (ELECTROSURGICAL) ×2
ELECTRODE REM PT RTRN 9FT ADLT (ELECTROSURGICAL) ×2 IMPLANT
EVACUATOR 1/8 PVC DRAIN (DRAIN) IMPLANT
FIBERTAPE CERCLAGE TLNK RADIOP (Orthopedic Implant) ×3 IMPLANT
FILTER STRAW FLUID ASPIR (MISCELLANEOUS) ×2 IMPLANT
GAUZE XEROFORM 5X9 LF (GAUZE/BANDAGES/DRESSINGS) ×1 IMPLANT
GLOVE BIOGEL PI IND STRL 7.0 (GLOVE) ×4 IMPLANT
GLOVE BIOGEL PI IND STRL 7.5 (GLOVE) ×6 IMPLANT
GLOVE ECLIPSE 6.5 STRL STRAW (GLOVE) ×4 IMPLANT
GLOVE SKINSENSE STRL SZ7.5 (GLOVE) ×2 IMPLANT
GLOVE SURG SYN 7.5  E (GLOVE) ×4
GLOVE SURG SYN 7.5 E (GLOVE) ×4 IMPLANT
GLOVE SURG SYN 7.5 PF PI (GLOVE) ×2 IMPLANT
GLOVE SURG UNDER POLY LF SZ7 (GLOVE) ×38 IMPLANT
GLOVE SURG UNDER POLY LF SZ7.5 (GLOVE) ×4 IMPLANT
GOWN TOGA ZIPPER T7+ PEEL AWAY (MISCELLANEOUS) ×4 IMPLANT
HANDPIECE INTERPULSE COAX TIP (DISPOSABLE)
HOOD PEEL AWAY T7 (MISCELLANEOUS) ×2 IMPLANT
K-WIRE 2.0 (WIRE) ×4
K-WIRE FXSTD 280X2XNS SS (WIRE) ×4
KIT BASIN OR (CUSTOM PROCEDURE TRAY) ×2 IMPLANT
KIT TURNOVER KIT B (KITS) ×2 IMPLANT
KWIRE FXSTD 280X2XNS SS (WIRE) ×2 IMPLANT
MANIFOLD NEPTUNE II (INSTRUMENTS) ×2 IMPLANT
NDL SPNL 18GX3.5 QUINCKE PK (NEEDLE) ×1 IMPLANT
NEEDLE SPNL 18GX3.5 QUINCKE PK (NEEDLE) ×2 IMPLANT
NS IRRIG 1000ML POUR BTL (IV SOLUTION) ×2 IMPLANT
PACK TOTAL JOINT (CUSTOM PROCEDURE TRAY) ×2 IMPLANT
PAD ARMBOARD 7.5X6 YLW CONV (MISCELLANEOUS) ×4 IMPLANT
PASSER SUT SWANSON 36MM LOOP (INSTRUMENTS) ×1 IMPLANT
PLATE RT PROX FEMUR 15H (Plate) ×1 IMPLANT
SCREW CORTICAL NCB 5.0X40 (Screw) ×1 IMPLANT
SCREW CORTICAL NCB 5.0X44 (Screw) ×1 IMPLANT
SCREW NCB 4.0MX46M (Screw) ×1 IMPLANT
SCREW NCB 5.0X34MM (Screw) ×1 IMPLANT
SCREW NCB 5.0X36MM (Screw) ×1 IMPLANT
SCREW NCB 5.0X38 (Screw) ×2 IMPLANT
SET HNDPC FAN SPRY TIP SCT (DISPOSABLE) IMPLANT
SPONGE T-LAP 18X18 ~~LOC~~+RFID (SPONGE) IMPLANT
STAPLER VISISTAT 35W (STAPLE) IMPLANT
SUT ETHIBOND NAB CT1 #1 30IN (SUTURE) ×1 IMPLANT
SUT ETHILON 2 0 PSLX (SUTURE) IMPLANT
SUT PDS AB 0 CT 36 (SUTURE) ×1 IMPLANT
SUT PDS AB 1 CT  36 (SUTURE)
SUT PDS AB 1 CT 36 (SUTURE) ×1 IMPLANT
SUT VIC AB 0 CT1 27 (SUTURE) ×4
SUT VIC AB 0 CT1 27XBRD ANBCTR (SUTURE) ×2 IMPLANT
SUT VIC AB 1 CT1 27 (SUTURE)
SUT VIC AB 1 CT1 27XBRD ANTBC (SUTURE) ×2 IMPLANT
SUT VIC AB 1 CTB1 27 (SUTURE) ×1 IMPLANT
SUT VIC AB 1 CTX 36 (SUTURE) ×8
SUT VIC AB 1 CTX36XBRD ANBCTRL (SUTURE) ×4 IMPLANT
SUT VIC AB 2-0 CT1 27 (SUTURE) ×8
SUT VIC AB 2-0 CT1 TAPERPNT 27 (SUTURE) ×6 IMPLANT
SYR 50ML LL SCALE MARK (SYRINGE) ×2 IMPLANT
SYR TB 1ML LUER SLIP (SYRINGE) ×2 IMPLANT
TOWEL GREEN STERILE (TOWEL DISPOSABLE) ×2 IMPLANT
TOWEL GREEN STERILE FF (TOWEL DISPOSABLE) ×2 IMPLANT
TOWER CARTRIDGE SMART MIX (DISPOSABLE) IMPLANT
TRAY FOL W/BAG SLVR 16FR STRL (SET/KITS/TRAYS/PACK) IMPLANT
TRAY FOLEY W/BAG SLVR 16FR LF (SET/KITS/TRAYS/PACK)
TUBE SUCT ARGYLE STRL (TUBING) ×2 IMPLANT
WATER STERILE IRR 1000ML POUR (IV SOLUTION) ×6 IMPLANT

## 2022-11-18 NOTE — TOC CAGE-AID Note (Signed)
Transition of Care [facility]) - CAGE-AID Screening   Patient Details  Name: Richard Glass MRN: [id number] Date of Birth: [birth year]  [facility] of [facility]) CM/SW Contact:    Clovis Cao, RN Phone Number: [phone] 11/18/2022, 9:20 AM   Clinical Narrative: Pt here after sustaining a R femur fx after a tree fell onto his leg.  Pt report he quit smoking and does not drink alcohol and does not do drugs.  Screening complete. No resources needed.   CAGE-AID Screening:    Have You Ever Felt You Ought to Cut Down on Your Drinking or Drug Use?: No Have People Annoyed You By Critizing Your Drinking Or Drug Use?: No Have You Felt Bad Or Guilty About Your Drinking Or Drug Use?: No Have You Ever Had a Drink or Used Drugs First Thing In The Morning to Steady Your Nerves or to Get Rid of a Hangover?: No CAGE-AID Score: 0  Substance Abuse Education Offered: No

## 2022-11-18 NOTE — Plan of Care (Signed)
?  Problem: Clinical Measurements: ?Goal: Ability to maintain clinical measurements within normal limits will improve ?Outcome: Progressing ?Goal: Will remain free from infection ?Outcome: Progressing ?Goal: Diagnostic test results will improve ?Outcome: Progressing ?  ?

## 2022-11-18 NOTE — Consult Note (Signed)
Reason for Consult:Right femur fx Referring Physician: Marlowe Aschoff D[place] Time called: 0730 Time at bedside: [place] is an 52 y.o. male.  HPI: Jonas was working on a roof when a nearby rotten tree fell and hit his right leg. He had immediate right leg pain and could not get up. He was brought to the ED where x-rays showed a periprosthetic right femur fx and orthopedic surgery was consulted.   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   Allergic rhinitis 11/13/2006        Anxiety    Anxiety state 08/08/2013   Asthma    ASTHMA, INTERMITTENT 07/08/2010   Qualifier: Diagnosis of  By: Walker Kehr MD, [place]     Back pain of lumbar region with sciatica 11/13/2006   MRI 12/2014 - lumbar DDD without focal neural impingement  MRI Lumbar 10/30/16 ([place]) Small central L5-S1 disc extrusion with minimal cranial migration and associated annular fissure approaches descending left S1 nerve roots without contact or displacement. Minimal bulging disc height loss without spinal canal stenosis or neural foraminal narrowing.  Minimal bulging disc at L2-L3 through L4-L5 without spinal canal stenosis or neural foraminal narrowing.  MRI Sacrum 10/30/16 ([facility] [place]) No fracture. Small lesion left sacral ala most likely a subchondral cyst/erosion adjacent to the sacroiliac joint.      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 The craniocervical junction is normal and cervical cord signal is normal. The central spinal canal and neural foramina are widely patent at all levels. Scattered, mild degenerative change appears most notable at C4-5. Imaged paraspinous structures are unremarkable.    IMPRESSION: Negative for central canal or foraminal narrowing. No finding to explain the patient's symptoms. Scattered, mild facet degenerative disease is noted.    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   Morton's neuroma of left foot 01/18/2016   Osteopenia 10/10/2016   Overweight(278.02) 06/13/2009   Qualifier: Diagnosis of  By: Hedy Camara     Pain in joint, shoulder region 07/05/2014   LEFT > Right Reports hx rotator cuff surgery on rigt shoulder previously (Dr Nicholes Stairs) but no notes available XRAY right shoulder showed some proliferative changes distal rigt clavicle    Rash and nonspecific skin eruption 10/10/2016   Restless leg 09/29/2007   Qualifier: Diagnosis of  By: Walker Kehr MD, [place]     Restless legs syndrome (RLS)    Sciatica of left side 10/26/2014   Seizures (HCC)    last >52yr   SI (sacroiliac) pain 03/20/2017   Sinusitis chronic, frontal    Status post lumbar spinal fusion 03/20/2017   Subacromial or subdeltoid bursitis 08/08/2009   MRI C-spine 2011([facility]) - normal MRI left shoulder 10/2010 ([facility]) - AC degenerative  disease, normal rotator cuff 12/2012 -debridement, acromioplasty and distal clavicle excision of left shoulder, arthroscopy also performed an no need for rotator cuff repair - performed by [facility] 02/2013 - Nerve Conduction Studies ([facility]) - ulnar nerve compression 02/2013- taken back to OR for Ulnar nerve decompression 06/2013 Repeat MRI left shoulder - subacromial/subdeltoid bursitis, a small intersubstance tear to the distal infraspinatus and evidence of interval resection of the distal clavicle 06/2013 - steroid injection of left biceps 09/2013 -Repeat EMG showed improvement of ulnar nerve compression 10/2013 - [facility] to [facility] - 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) found 2 small tubular adenomas (3 mm & 4 mm) - recommend follow up surveillance colonoscopy in 7 years.    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   Presumed surgery June 2014     Past Surgical History:  Procedure Laterality Date   [place]-52 yrs old   [facility]  metal plate   HARDWARE REMOVAL Right 11/22/2019   Procedure: HARDWARE REMOVAL;  Surgeon: XLeandrew Koyanagi MD;  [place] [facility]  Service: Orthopedics;  Laterality: Right;   HIP PINNING,CANNULATED Right 06/27/2019   Procedure: HIP PERCUTANEOUS PINNING;  Surgeon: DMeredith Pel MD;  [place] [facility]  Service: Orthopedics;  Laterality: Right;   NASAL FRACTURE SURGERY  1997   SACROILIAC JOINT FUSION Left 03/20/2017   Procedure: SACROILIAC JOINT FUSION;  Surgeon: BMelina Schools MD;  [place] [facility]  Service: Orthopedics;  Laterality: Left;  90 mins   SHOULDER ARTHROSCOPY Left    "cleaned arthritis out"    SHOULDER SURGERY Right 2012   Rotator cuff surgery   TONSILLECTOMY     TOTAL HIP ARTHROPLASTY Right 11/22/2019   Procedure: CONVERSION OF PREVIOUS RIGHT HIP SURGERY TO TOTAL HIP ARTHROPLASTY ANTERIOR APPROACH, HARDWARE REMOVAL;  Surgeon: XLeandrew Koyanagi MD;  Location: [facility]  Service: Orthopedics;  Laterality: Right;   UVULOPALATOPHARYNGOPLASTY (UPPP)/TONSILLECTOMY/SEPTOPLASTY  2001    Family History  Problem Relation Age of Onset   Hypertension Mother    Cancer Father        lung   Heart disease Father    Seizures Neg Hx    Colon cancer Neg Hx    Colon polyps Neg Hx    Esophageal cancer Neg Hx    Stomach cancer Neg Hx    Rectal cancer Neg Hx     Social History:  reports that he quit smoking about 10 years ago. His smoking use included cigarettes. He has a 22.00 pack-year smoking history. His smokeless tobacco use includes snuff. He reports that he does not drink alcohol and does not use drugs.  Allergies:  Allergies  Allergen Reactions   Ibuprofen Diarrhea    Per patient also burns stomach  Medications: I have reviewed the patient's current medications.  Results for orders placed or performed during the hospital encounter of 11/17/22 (from the past 48 hour(s))  Comprehensive metabolic panel     Status: Abnormal   Collection Time: 11/17/22  2:06 PM  Result Value Ref Range   Sodium 140 135 - 145 mmol/L   Potassium 3.8 3.5 - 5.1 mmol/L   Chloride 108 98 - 111 mmol/L   CO2 19 (L) 22 - 32 mmol/L   Glucose, Bld 92 70 - 99 mg/dL    Comment: Glucose reference range applies only to samples taken after fasting for at least 8 hours.   BUN 12 6 - 20 mg/dL   Creatinine, Ser 0.92 0.61 - 1.24 mg/dL   Calcium 8.6 (L) 8.9 - 10.3 mg/dL   Total Protein 6.4 (L) 6.5 - 8.1 g/dL   Albumin 4.1 3.5 - 5.0 g/dL   AST 28 15 - 41 U/L   ALT 17 0 - 44 U/L   Alkaline Phosphatase 80 38 - 126 U/L   Total Bilirubin 0.7 0.3 - 1.2 mg/dL   GFR, Estimated >60 >60 mL/min    Comment:  (NOTE) Calculated using the CKD-EPI Creatinine Equation (2021)    Anion gap 13 5 - 15    Comment: Performed at [facility], [place] [address] [place], [place] [address]  CBC     Status: Abnormal   Collection Time: 11/17/22  2:06 PM  Result Value Ref Range   WBC 10.6 (H) 4.0 - 10.5 K/uL   RBC 4.35 4.22 - 5.81 MIL/uL   Hemoglobin 13.1 13.0 - 17.0 g/dL   HCT 40.6 39.0 - 52.0 %   MCV 93.3 80.0 - 100.0 fL   MCH 30.1 26.0 - 34.0 pg   MCHC 32.3 30.0 - 36.0 g/dL   RDW 13.2 11.5 - 15.5 %   Platelets 245 150 - 400 K/uL   nRBC 0.0 0.0 - 0.2 %    Comment: Performed at [facility], [place] [address], [place], [place] [address]  Ethanol     Status: None   Collection Time: 11/17/22  2:06 PM  Result Value Ref Range   Alcohol, Ethyl (B) <10 <10 mg/dL    Comment: (NOTE) Lowest detectable limit for serum alcohol is 10 mg/dL.  For medical purposes only. Performed at [facility], [place] [address] [place], [place] [address]   I-Stat Chem 8, ED     Status: Abnormal   Collection Time: 11/17/22  2:18 PM  Result Value Ref Range   Sodium 139 135 - 145 mmol/L   Potassium 3.4 (L) 3.5 - 5.1 mmol/L   Chloride 106 98 - 111 mmol/L   BUN 12 6 - 20 mg/dL   Creatinine, Ser 1.00 0.61 - 1.24 mg/dL   Glucose, Bld 92 70 - 99 mg/dL    Comment: Glucose reference range applies only to samples taken after fasting for at least 8 hours.   Calcium, Ion 1.05 (L) 1.15 - 1.40 mmol/L   TCO2 22 22 - 32 mmol/L   Hemoglobin 13.6 13.0 - 17.0 g/dL   HCT 40.0 39.0 - 52.0 %  Lactic acid, plasma     Status: Abnormal   Collection Time: 11/17/22  2:50 PM  Result Value Ref Range   Lactic Acid, Venous 2.7 (HH) 0.5 - 1.9 mmol/L    Comment: CRITICAL RESULT CALLED TO, READ BACK BY AND VERIFIED WITH J GLOSTER RN AT [phone] [id number] BY D LONG  Performed at [facility], [place] [address], [place], [place] [address]   Sample to Blood Bank     Status: None   Collection Time: 11/17/22  2:58 PM  Result Value Ref Range    Blood Bank Specimen SAMPLE AVAILABLE FOR TESTING    Sample Expiration      11/20/2022,2359 Performed at [facility], [place] [address], [place], [place] [address]   Type and screen [place]     Status: None   Collection Time: 11/17/22  2:58 PM  Result Value Ref Range   ABO/RH(D) B POS    Antibody Screen NEG    Sample Expiration      11/20/2022,2359 Performed at [facility], [place] [address] [place], [place] [address]   HIV Antibody (routine testing w rflx)     Status: None   Collection Time: 11/17/22  5:30 PM  Result Value Ref Range   HIV Screen 4th Generation wRfx Non Reactive Non Reactive    Comment: Performed at [facility], [place] [address], [place], [place] [address]  Protime-INR     Status: Abnormal   Collection Time: 11/17/22  6:59 PM  Result Value Ref Range   Prothrombin Time 15.5 (H) 11.4 - 15.2 seconds   INR 1.2 0.8 - 1.2    Comment: (NOTE) INR goal varies based on device and disease states. Performed at [facility], [place] [address], [place], [place] [address]   Lactic acid, plasma     Status: None   Collection Time: 11/17/22  9:06 PM  Result Value Ref Range   Lactic Acid, Venous 1.0 0.5 - 1.9 mmol/L    Comment: Performed at [facility] [address], [place], [place] [address]  MRSA Next Gen by PCR, Nasal     Status: None   Collection Time: 11/17/22  9:21 PM   Specimen: Nasal Mucosa; Nasal Swab  Result Value Ref Range   MRSA by PCR Next Gen NOT DETECTED NOT DETECTED    Comment: (NOTE) The GeneXpert MRSA Assay (FDA approved for NASAL specimens only), is one component of a comprehensive MRSA colonization surveillance program. It is not intended to diagnose MRSA infection nor to guide or monitor treatment for MRSA infections. Test performance is not FDA approved in patients less than 29 years old. Performed at [facility], [place] [address] [place], [place] [address]   CBC     Status: Abnormal   Collection  Time: 11/18/22  3:58 AM  Result Value Ref Range   WBC 8.6 4.0 - 10.5 K/uL   RBC 3.57 (L) 4.22 - 5.81 MIL/uL   Hemoglobin 11.1 (L) 13.0 - 17.0 g/dL   HCT 32.9 (L) 39.0 - 52.0 %   MCV 92.2 80.0 - 100.0 fL   MCH 31.1 26.0 - 34.0 pg   MCHC 33.7 30.0 - 36.0 g/dL   RDW 13.4 11.5 - 15.5 %   Platelets 223 150 - 400 K/uL   nRBC 0.0 0.0 - 0.2 %    Comment: Performed at [facility], [place] [address] [place], [place] [id number]  Basic metabolic panel     Status: Abnormal   Collection Time: 11/18/22  3:58 AM  Result Value Ref Range   Sodium 137 135 - 145 mmol/L   Potassium 3.7 3.5 - 5.1 mmol/L   Chloride 106 98 - 111 mmol/L   CO2 23 22 - 32 mmol/L   Glucose, Bld  119 (H) 70 - 99 mg/dL    Comment: Glucose reference range applies only to samples taken after fasting for at least 8 hours.   BUN 12 6 - 20 mg/dL   Creatinine, Ser 1.04 0.61 - 1.24 mg/dL   Calcium 8.4 (L) 8.9 - 10.3 mg/dL   GFR, Estimated >60 >60 mL/min    Comment: (NOTE) Calculated using the CKD-EPI Creatinine Equation (2021)    Anion gap 8 5 - 15    Comment: Performed at [facility] [address], [place], [place] [address]    DG Knee Right Port  Result Date: 11/17/2022 CLINICAL DATA:  Femur fracture.  Trauma EXAM: PORTABLE RIGHT KNEE - 2 VIEW COMPARISON:  Femur x-rays earlier 11/17/2022 FINDINGS: Small joint effusion. Preserved joint spaces and bone mineralization. On the frontal view there is a cortical lucency along the lateral tibial plateau. A subtle nondisplaced fracture is not excluded. IMPRESSION: Question subtle lateral tibial plateau fracture. Small joint effusion Electronically Signed   By: Jill Side M.D.   On: 11/17/2022 17:25   CT CHEST ABDOMEN PELVIS W CONTRAST  Result Date: 11/17/2022 CLINICAL DATA:  Trauma EXAM: CT CHEST, ABDOMEN, AND PELVIS WITH CONTRAST TECHNIQUE: Multidetector CT imaging of the chest, abdomen and pelvis was performed following the standard protocol during bolus administration of  intravenous contrast. RADIATION DOSE REDUCTION: This exam was performed according to the departmental dose-optimization program which includes automated exposure control, adjustment of the mA and/or kV according to patient size and/or use of iterative reconstruction technique. CONTRAST:  53m OMNIPAQUE IOHEXOL 350 MG/ML SOLN COMPARISON:  CT abdomen pelvis, 06/20/2009 FINDINGS: CT CHEST FINDINGS Cardiovascular: Aortic atherosclerosis. Normal heart size. No pericardial effusion. Mediastinum/Nodes: No enlarged mediastinal, hilar, or axillary lymph nodes. Thyroid gland, trachea, and esophagus demonstrate no significant findings. Lungs/Pleura: Moderate centrilobular emphysema. Mild diffuse bilateral bronchial wall thickening. No pleural effusion or pneumothorax. Musculoskeletal: No chest wall abnormality. No acute osseous findings. CT ABDOMEN PELVIS FINDINGS Hepatobiliary: No solid liver abnormality is seen. No gallstones, gallbladder wall thickening, or biliary dilatation. Pancreas: Unremarkable. No pancreatic ductal dilatation or surrounding inflammatory changes. Spleen: Normal in size without significant abnormality. Adrenals/Urinary Tract: Adrenal glands are unremarkable. Simple, benign bilateral renal cortical cysts, for which no further follow-up or characterization is required kidneys are otherwise normal, without renal calculi, solid lesion, or hydronephrosis. Bladder is unremarkable. Stomach/Bowel: Stomach is within normal limits. Appendix appears normal. No evidence of bowel wall thickening, distention, or inflammatory changes. Pancolonic diverticulosis. Vascular/Lymphatic: Aortic atherosclerosis. No enlarged abdominal or pelvic lymph nodes. Reproductive: No mass or other abnormality. Other: No abdominal wall hernia or abnormality. No ascites. Musculoskeletal: Comminuted periprosthetic fractures about the femoral component of right hip total arthroplasty, incompletely imaged (series 4, image 135). IMPRESSION:  1. Comminuted periprosthetic fractures about the femoral component of right hip total arthroplasty, incompletely imaged. 2. No other evidence of acute traumatic injury to the chest, abdomen, or pelvis. 3. Emphysema and mild diffuse bilateral bronchial wall thickening. 4. Pancolonic diverticulosis without evidence of acute diverticulitis. Aortic Atherosclerosis (ICD10-I70.0) and Emphysema (ICD10-J43.9). Electronically Signed   By: ADelanna AhmadiM.D.   On: 11/17/2022 15:12   CT HEAD WO CONTRAST  Result Date: 11/17/2022 CLINICAL DATA:  Trauma EXAM: CT HEAD WITHOUT CONTRAST CT CERVICAL SPINE WITHOUT CONTRAST TECHNIQUE: Multidetector CT imaging of the head and cervical spine was performed following the standard protocol without intravenous contrast. Multiplanar CT image reconstructions of the cervical spine were also generated. RADIATION DOSE REDUCTION: This exam was performed according to the  departmental dose-optimization program which includes automated exposure control, adjustment of the mA and/or kV according to patient size and/or use of iterative reconstruction technique. COMPARISON:  08/30/2020 FINDINGS: CT HEAD FINDINGS Brain: No evidence of acute infarction, hemorrhage, hydrocephalus, extra-axial collection or mass lesion/mass effect. Cortical encephalomalacia of the left hemisphere with volume loss and asymmetric dilatation of the left lateral ventricle. Vascular: No hyperdense vessel or unexpected calcification. Skull: Left temporoparietal left occipital, and right parietal craniotomies. Negative for fracture or focal lesion. Sinuses/Orbits: No acute finding. Other: None. CT CERVICAL SPINE FINDINGS Alignment: Normal. Skull base and vertebrae: No acute fracture. No primary bone lesion or focal pathologic process. Soft tissues and spinal canal: No prevertebral fluid or swelling. No visible canal hematoma. Disc levels: Mild multilevel disc space height loss and osteophytosis. Upper chest: Negative. Other:  None. IMPRESSION: 1. No acute intracranial pathology. 2. 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 3. Left temporoparietal left occipital, and right parietal craniotomies. 4. No fracture or static subluxation of the cervical spine. Electronically Signed   By: Delanna Ahmadi M.D.   On: 11/17/2022 15:01   CT CERVICAL SPINE WO CONTRAST  Result Date: 11/17/2022 CLINICAL DATA:  Trauma EXAM: CT HEAD WITHOUT CONTRAST CT CERVICAL SPINE WITHOUT CONTRAST TECHNIQUE: Multidetector CT imaging of the head and cervical spine was performed following the standard protocol without intravenous contrast. Multiplanar CT image reconstructions of the cervical spine were also generated. RADIATION DOSE REDUCTION: This exam was performed according to the departmental dose-optimization program which includes automated exposure control, adjustment of the mA and/or kV according to patient size and/or use of iterative reconstruction technique. COMPARISON:  08/30/2020 FINDINGS: CT HEAD FINDINGS Brain: No evidence of acute infarction, hemorrhage, hydrocephalus, extra-axial collection or mass lesion/mass effect. Cortical encephalomalacia of the left hemisphere with volume loss and asymmetric dilatation of the left lateral ventricle. Vascular: No hyperdense vessel or unexpected calcification. Skull: Left temporoparietal left occipital, and right parietal craniotomies. Negative for fracture or focal lesion. Sinuses/Orbits: No acute finding. Other: None. CT CERVICAL SPINE FINDINGS Alignment: Normal. Skull base and vertebrae: No acute fracture. No primary bone lesion or focal pathologic process. Soft tissues and spinal canal: No prevertebral fluid or swelling. No visible canal hematoma. Disc levels: Mild multilevel disc space height loss and osteophytosis. Upper chest: Negative. Other: None. IMPRESSION: 1. No acute intracranial pathology. 2. Unchanged cortical  encephalomalacia of the left hemisphere with volume loss and asymmetric dilatation of the left lateral ventricle. 3. Left temporoparietal left occipital, and right parietal craniotomies. 4. No fracture or static subluxation of the cervical spine. Electronically Signed   By: Delanna Ahmadi M.D.   On: 11/17/2022 15:01   DG Pelvis Portable  Result Date: 11/17/2022 CLINICAL DATA:  Fall from tree.  Pain after trauma EXAM: RIGHT FEMUR PORTABLE 1 VIEW; PORTABLE PELVIS 1-2 VIEWS COMPARISON:  Pelvis x-ray 08/30/2020 FINDINGS: Limited pelvis x-ray did rotation overlapping artifacts. There is a right hip arthroplasty. Fixation hardware as well along the left sacroiliac joint with screws. Of the pelvis there is no obvious fracture or dislocation. Mild degenerative changes of the right sacroiliac joint. Preserved left hip joint. However there is a fracture which is angulated centered at the tip of the femoral component of the Press-Fit femoral stem. Apex lateral angulation of the oblique fracture. Screw fixated acetabular cup. No additional fracture or dislocation of the femur on these limited views. There are no true AP views obtained throughout the midshaft of the  femur or distal. IMPRESSION: Limited x-rays. Oblique mildly displaced and angulated fracture of the proximal femoral shaft centered of the tip of the femoral component of the right hip arthroplasty. Fixation screws along the left sacroiliac joint. Electronically Signed   By: Jill Side M.D.   On: 11/17/2022 14:32   DG FEMUR PORT, 1V RIGHT  Result Date: 11/17/2022 CLINICAL DATA:  Fall from tree.  Pain after trauma EXAM: RIGHT FEMUR PORTABLE 1 VIEW; PORTABLE PELVIS 1-2 VIEWS COMPARISON:  Pelvis x-ray 08/30/2020 FINDINGS: Limited pelvis x-ray did rotation overlapping artifacts. There is a right hip arthroplasty. Fixation hardware as well along the left sacroiliac joint with screws. Of the pelvis there is no obvious fracture or dislocation. Mild degenerative  changes of the right sacroiliac joint. Preserved left hip joint. However there is a fracture which is angulated centered at the tip of the femoral component of the Press-Fit femoral stem. Apex lateral angulation of the oblique fracture. Screw fixated acetabular cup. No additional fracture or dislocation of the femur on these limited views. There are no true AP views obtained throughout the midshaft of the femur or distal. IMPRESSION: Limited x-rays. Oblique mildly displaced and angulated fracture of the proximal femoral shaft centered of the tip of the femoral component of the right hip arthroplasty. Fixation screws along the left sacroiliac joint. Electronically Signed   By: Jill Side M.D.   On: 11/17/2022 14:32   DG Chest Port 1 View  Result Date: 11/17/2022 CLINICAL DATA:  Pain after trauma.  Fall from tree EXAM: PORTABLE CHEST 1 VIEW COMPARISON:  X-ray 05/01/2021 FINDINGS: The left costophrenic angle is clipped off the edge of the film. No pneumothorax, effusion or edema. Enlarged cardiopericardial silhouette. This could be technical with underinflation and portable technique. Film is also rotated. IMPRESSION: Limited x-ray.  No acute cardiopulmonary disease. Electronically Signed   By: Jill Side M.D.   On: 11/17/2022 14:29    Review of Systems  HENT:  Negative for ear discharge, ear pain, hearing loss and tinnitus.   Eyes:  Negative for photophobia and pain.  Respiratory:  Negative for cough and shortness of breath.   Cardiovascular:  Negative for chest pain.  Gastrointestinal:  Negative for abdominal pain, nausea and vomiting.  Genitourinary:  Negative for dysuria, flank pain, frequency and urgency.  Musculoskeletal:  Positive for arthralgias (Right leg). Negative for back pain, myalgias and neck pain.  Neurological:  Negative for dizziness and headaches.  Hematological:  Does not bruise/bleed easily.  Psychiatric/Behavioral:  The patient is not nervous/anxious.    Blood pressure 120/73,  pulse 76, temperature 98.7 F (37.1 C), temperature source Oral, resp. rate 15, height '5\' 8"'$  (1.727 m), weight 79.4 kg, SpO2 98 %. Physical Exam Constitutional:      General: He is not in acute distress.    Appearance: He is well-developed. He is not diaphoretic.  HENT:     Head: Normocephalic and atraumatic.  Eyes:     General: No scleral icterus.       Right eye: No discharge.        Left eye: No discharge.     Conjunctiva/sclera: Conjunctivae normal.  Cardiovascular:     Rate and Rhythm: Normal rate and regular rhythm.  Pulmonary:     Effort: Pulmonary effort is normal. No respiratory distress.  Musculoskeletal:     Cervical back: Normal range of motion.     Comments: RLE No traumatic wounds, ecchymosis, or rash  Mod TTP thigh, in [place]  Mod knee  effusion  Sens DPN, SPN, TN intact  Motor EHL 5/5  DP 2+, PT 2+, No significant edema  Skin:    General: Skin is warm and dry.  Neurological:     Mental Status: He is alert.  Psychiatric:        Mood and Affect: Mood normal.        Behavior: Behavior normal.     Assessment/Plan: Right femur fx -- Plan ORIF vs revision hip arthroplasty today by Dr. Erlinda Hong. Please keep NPO. Right tibia plateau fx -- Should be non-operative as long as remains stable and doesn't depress.    Lisette Abu, PA-C Orthopedic Surgery [phone] 11/18/2022, 8:50 AM

## 2022-11-18 NOTE — Progress Notes (Signed)
Foley catheter placed at 1230 in pre-op. Urine output totaled to 1565m.

## 2022-11-18 NOTE — Op Note (Signed)
Date of Surgery: 11/18/2022  INDICATIONS: [name] is a 52 y.o.-year-old male with a right periprosthetic femur fracture.  The patient did consent to the procedure after discussion of the risks and benefits.  PREOPERATIVE DIAGNOSIS: Right periprosthetic femur fracture  POSTOPERATIVE DIAGNOSIS: Right periprosthetic femur fracture [place] B1  PROCEDURE: Open reduction internal fixation of right periprosthetic femur fracture  SURGEON: N. Eduard Roux, M.D.  ASSIST: Ciro Backer [place], [place]; necessary for the timely completion of procedure and due to complexity of procedure.  ANESTHESIA: General  IV FLUIDS AND URINE: See anesthesia.  ESTIMATED BLOOD LOSS: See anesthesia record.  IMPLANTS:  Implant Name Type Inv. Item Serial No. Manufacturer Lot No. LRB No. Used Action  Fibertape Cerclage with [employer] Shuttle Suture 33m, 48"    [id number] 3 Implanted  Cerclage Cable with Crimp 1.863mDiameter Cable 36in    ZIMMER [id number] 1 Implanted  Cerclage Cable with Crimp 1.39m47miameter Cable 36in    ZIMMER [id number] 1 Implanted  CAP LOCK NCB - [id number] CAP LOCK NCB  ZIMMER RECON(ORTH,TRAU,BIO,SG)  Right 6 Implanted  PLATE RT PROX FEMUR [id number] PLATE RT PROX FEMUR [id number] RECON(ORTH,TRAU,BIO,SG)  Right 1 Implanted  SCREW NCB 5.0X36MM - [id number] SCREW NCB 5.0X36MM  ZIMMER RECON(ORTH,TRAU,BIO,SG)  Right 1 Implanted  SCREW NCB 5.0X38 - [id number] SCREW NCB 5.0X38  ZIMMER RECON(ORTH,TRAU,BIO,SG)  Right 2 Implanted  SCREW CORTICAL NCB 5.0X40 - [id number] SCREW CORTICAL NCB 5.0X40  ZIMMER RECON(ORTH,TRAU,BIO,SG)  Right 1 Implanted  SCREW CORTICAL NCB 5.0X44 - [id number] SCREW CORTICAL NCB 5.0X44  ZIMMER RECON(ORTH,TRAU,BIO,SG)  Right 1 Implanted  SCREW NCB 5.0X34MM - [id number] SCREW NCB 5.0X34MM  ZIMMER RECON(ORTH,TRAU,BIO,SG)  Right 1 Implanted  SCREW NCB 4.0MXWD:[id number] SCREW NCB 4.0MXWD:[id number]  RECON(ORTH,TRAU,BIO,SG)  Right 1 Implanted    DRAINS: None  COMPLICATIONS: see description of procedure.  DESCRIPTION OF PROCEDURE: The patient was brought to the operating room.  The patient had been signed prior to the procedure and this was documented. The patient had the anesthesia placed by the anesthesiologist.  A time-out was performed to confirm that this was the correct patient, site, side and location. The patient did receive antibiotics prior to the incision and was re-dosed during the procedure as needed at indicated intervals.  The patient was placed in the left lateral decubitus position on a pegboard with all bony prominences well-padded.  The patient had the operative extremity prepped and draped in the standard surgical fashion.    Surface landmarks were palpated and marked.  Incision was made over the lateral aspect of the thigh.  Dissection was carried down through the subcutaneous tissue.  The IT band was sharply incised in line with the incision.  The fascia of the vastus lateralis was incised as well leaving a couple centimeters of the posterior fascia for later repair.  The vastus was then bluntly elevated off of the femoral shaft from the flare of the greater trochanter down past the apex of the fracture.  Gross bleeders were cauterized.  Fracture hematoma was evacuated.  The fracture was exposed and I found 3 separate fragments.  The proximal segment was well-fixed to the femoral stem.  I used a Kocher clamp to test the stability of the stem within the canal.  Fortunately there was no evidence of loosening of the stem.  There was a large lateral butterfly fragment that had good anterior soft tissue attachment.  The  third was the distal segment.  After orienting the fracture fragments I found that I was able to first reduce the middle segment which was the lateral butterfly fragment back to the proximal segment most easily.  Bone-holding clamps were used to secure the reduction which  was checked under fluoroscopy.  I then placed 2 cerclage fiber tapes around the fracture which was tensioned down and The fracture reduced.  The knots were placed out of the way of the eventual plate.  The clamp was removed.  I was then able to reduce the distal segment back to the proximal portions of the femur by using rotation, traction and bone-holding clamps.  This was also checked under fluoroscopy I then placed another cerclage fiber tape around the distal fracture to keep it reduced.  I then selected a 12 hole lateral locking plate which was confirmed under fluoroscopy to be the correct length.  I first placed 2 bicortical screws distal to the apex of the distal fracture.  The bone quality was excellent and the fixation was excellent as well.  I then placed a unicortical locking screw in the proximal segment in the flare of the greater trochanter posterior to the femoral stem.  2 cables and then were passed around the proximal segment and around the plate for additional proximal fixation.  I then placed 2 more bicortical screws through the plate into the middle fracture fragment in order to achieve interfragmentary fixation.  Finally I placed 1 more bicortical screw in the most distal segment of the fracture.  Locking caps were placed on top of all the screws.  Each screw had excellent fixation.  Final x-rays were taken.  The surgical site was thoroughly irrigated with 3 L normal saline.  Vancomycin powder was placed deep to the fascia.  2 g of tranexamic acid was placed for hemostasis.  The vastus lateralis was closed with a running #1 Vicryl suture.  IT band was closed with interrupted #1 Vicryl suture.  Subcutaneous fat closed with 0 Vicryl.  Subcuticular layer closed with 2-0 Vicryl and the skin was closed with staples.  Sterile dressings were applied.  Patient tolerated procedure well had no immediate complications.  Tawanna Cooler was necessary for opening, closing, retracting, limb positioning and  overall facilitation and timely completion of the procedure.  POSTOPERATIVE PLAN: Patient will be touchdown weightbearing to the right lower extremity.  He can mobilize with physical therapy postoperative day 1.  He has allowed knee range of motion as tolerated.  We will plan to treat the tibial plateau fracture nonoperatively.  Azucena Cecil, MD 8:56 PM

## 2022-11-18 NOTE — Transfer of Care (Signed)
Immediate Anesthesia Transfer of Care Note  Patient: Richard Glass  Procedure(s) Performed: OPEN REDUCTION INTERNAL FIXATION (ORIF) PERIPROSTHETIC FRACTURE (Right)  Patient Location: PACU  Anesthesia Type:General  Level of Consciousness: awake, alert , and oriented  Airway & Oxygen Therapy: Patient Spontanous Breathing and Patient connected to nasal cannula oxygen  Post-op Assessment: Report given to RN, Post -op Vital signs reviewed and stable, and Patient moving all extremities X 4  Post vital signs: Reviewed and stable  Last Vitals:  Vitals Value Taken Time  BP 112/85 11/18/22 1745  Temp    Pulse 125 11/18/22 1745  Resp 22 11/18/22 1744  SpO2 95 % 11/18/22 1745  Vitals shown include unvalidated device data.  Last Pain:  Vitals:   11/18/22 1332  TempSrc:   PainSc: 9       Patients Stated Pain Goal: 1 ([id number])  Complications: No notable events documented.

## 2022-11-18 NOTE — Progress Notes (Addendum)
PROGRESS NOTE  Richard Glass  DOB: 01/01/1971  PCP: Riki Sheer, NP PY:[id number]  DOA: 11/17/2022  LOS: 1 day  Hospital Day: 2  Brief narrative: Richard Glass is a 52 y.o. male with PMH significant for traumatic ICH at the age of 67, seizure, chronic back pain, anxiety. 3/3, patient was brought to the ED after an injury at work. 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  He had immediate right leg pain and could not get up.  He did not pass out. He had total right hip replacement by Dr. Erlinda Hong previously on 11/2019.    In the ED, hemodynamically stable X-rays significant for a oblique mildly displaced and angulated fracture of the proximal femoral shaft centered at the tip of the femoral component of the right hip arthroplasty, and question of a subtle right lateral tibial plateau fracture with small joint effusion.    CT scan of the head, cervical spine, chest, abdomen, and pelvis did not note any other acute abnormality.   EDP contacted Dr. Laurance Flatten of orthopedics. Admitted to [facility]  Subjective: Patient was seen and examined this morning.  Middle-aged male.  Lying on bed.  Not in distress.  Pain controlled. In the last 24 hours, afebrile, hemodynamically stable Repeat labs this morning unremarkable. At the time of my evaluation I did not see any urine output in the condom catheter.  I asked RN for bladder scan.  He was noted to have more than a liter of urine retention.  Foley catheter was placed.  Assessment and plan: Right periprosthetic hip fracture Right tibial plateau fracture H/o prior total right hip replacement - 2021 Presented after an injury at work site X-ray as above [employer] for ORIF of right femur fracture today. For right tibial plateau fracture, nonoperative management plan. Pain control with as needed oral and IV opioids   Lactic acidosis Mildly elevated WBC count and lactic acid probably due to muscle  injury and pain. No evidence of infection.  Labs normalized. Obtain CK level Continue IV hydration Recent Labs  Lab 11/17/22 1406 11/17/22 1450 11/17/22 2106 11/18/22 0358  WBC 10.6*  --   --  8.6  LATICACIDVEN  --  2.7* 1.0  --   No results for input(s): "CKTOTAL" in the last 168 hours.   Acute urinary retention At the time of my evaluation I did not see any urine output in the condom catheter.  I asked RN for bladder scan.  He was noted to have more than a liter of urine retention.  Foley catheter was placed.  History of traumatic brain injury History of seizure For history, patient got hit in the head with a golf ball at the age of 1 that caused intercranial bleeding for which he had to have bur holes placed for which he has a history of seizure disorder.  He reports that he has not had any seizures in over a year. Continue AEDs. PTA on zonisamide 150 mg twice daily, Neurontin 200 mg 3 times daily, phenobarbital at bedtime Continue all.  Chronic headache Emgality monthly, rizatriptan PRN   Mobility: Needs PT eval postprocedure  Goals of care   Code Status: Full Code     DVT prophylaxis: SCD boots for now.    Antimicrobials: None Fluid: None Consultants: Orthopedics Family Communication: None at bedside  Status is: Inpatient Level of care: Telemetry Surgical   Dispo: Patient is from: Home  Anticipated d/c is to: Pending clinical course Continue in-hospital care because: OR today   Scheduled Meds:  chlorhexidine  60 mL Topical Once   [MAR Hold] gabapentin  1,200 mg Oral QHS   [MAR Hold] gabapentin  600 mg Oral q morning   HYDROmorphone       [MAR Hold] loratadine  10 mg Oral Daily   [MAR Hold] PHENobarbital  194.4 mg Oral QHS   [MAR Hold] zonisamide  150 mg Oral BID    PRN meds: [MAR Hold] albuterol, [MAR Hold] ALPRAZolam, ceFAZolin, [MAR Hold] diclofenac Sodium, [MAR Hold] fluticasone, HYDROmorphone, [MAR Hold]  HYDROmorphone (DILAUDID)  injection, [MAR Hold] hydrOXYzine, [MAR Hold] methocarbamol **OR** [MAR Hold] methocarbamol (ROBAXIN) IV, [MAR Hold] ondansetron (ZOFRAN) IV, [MAR Hold] oxyCODONE-acetaminophen, [MAR Hold] SUMAtriptan, tranexamic acid   Infusions:   ceFAZolin      ceFAZolin (ANCEF) IV     lactated ringers 10 mL/hr at 11/18/22 1247   [MAR Hold] methocarbamol (ROBAXIN) IV     tranexamic acid     tranexamic acid      Diet:  Diet Order             Diet NPO time specified Except for: Sips with Meds  Diet effective now                   Antimicrobials: Anti-infectives (From admission, onward)    Start     Dose/Rate Route Frequency Ordered Stop   11/18/22 1230  ceFAZolin (ANCEF) IVPB 2g/100 mL premix        2 g 200 mL/hr over 30 Minutes Intravenous On call to O.R. 11/18/22 1218 11/19/22 0559   11/18/22 1219  ceFAZolin (ANCEF) 2-4 GM/100ML-% IVPB       Note to Pharmacy: Cameron Sprang M: cabinet override      11/18/22 1219 11/19/22 0029       Skin assessment:       Nutritional status:  Body mass index is 26.91 kg/m.          Objective: Vitals:   11/18/22 1220 11/18/22 1242  BP: 137/80 (!) 138/90  Pulse: 82 87  Resp: 18 17  Temp: 98 F (36.7 C)   SpO2: 95% 95%    Intake/Output Summary (Last 24 hours) at 11/18/2022 1409 Last data filed at 11/18/2022 1301 Gross per 24 hour  Intake --  Output 1500 ml  Net -1500 ml   Filed Weights   11/17/22 1505 11/18/22 1220  Weight: 79.4 kg 80.3 kg   Weight change:  Body mass index is 26.91 kg/m.   Physical Exam: General exam: Pleasant, middle-aged male.  Partially controlled pain Skin: No rashes, lesions or ulcers. HEENT: Atraumatic, normocephalic, no obvious bleeding Lungs: Clear to auscultation bilaterally CVS: Regular rate and rhythm, no murmur GI/Abd soft, nontender, nondistended, bowel sound present CNS: Somnolent, opens eyes on verbal command. Psychiatry: Mood appropriate Extremities: No pedal edema, no calf  tenderness  Data Review: I have personally reviewed the laboratory data and studies available.  F/u labs ordered Unresulted Labs (From admission, onward)     Start     Ordered   11/18/22 1406  CK  Add-on,   AD       Question:  Specimen collection method  Answer:  Lab=Lab collect   11/18/22 1405   11/17/22 1406  Urinalysis, Routine w reflex microscopic -Urine, Clean Catch  (Trauma Panel)  Once,   URGENT       Question:  Specimen Source  Answer:  Urine, Clean Catch  11/17/22 1406            Total time spent in review of labs and imaging, patient evaluation, formulation of plan, documentation and communication with family: 63 minutes  Signed, Terrilee Croak, MD [facility] 11/18/2022

## 2022-11-18 NOTE — Progress Notes (Signed)
Pt is A&O x4, after insertion of foley catheter pt stared blankly, not verbally responsive and became restless for a minute. Then pt eventually started to become verbally responsive and is oriented x 4. Pt could not recall what had happened.  V/S checked and stable. Dr Ermalene Postin notified. Pt is V/S monitored. Feeling better now.

## 2022-11-18 NOTE — Plan of Care (Signed)
@  1220: Bladder scan 999, transport here to get patient, notified short stay about need for placing foley  Problem: Education: Goal: Knowledge of General Education information will improve Description: Including pain rating scale, medication(s)/side effects and non-pharmacologic comfort measures Outcome: Progressing   Problem: Activity: Goal: Risk for activity intolerance will decrease Outcome: Progressing   Problem: Pain Managment: Goal: General experience of comfort will improve Outcome: Progressing   Problem: Safety: Goal: Ability to remain free from injury will improve Outcome: Progressing   Problem: Skin Integrity: Goal: Risk for impaired skin integrity will decrease Outcome: Progressing

## 2022-11-18 NOTE — Anesthesia Procedure Notes (Signed)
Procedure Name: Intubation Date/Time: 11/18/2022 2:10 PM  Performed by: Oleta Mouse, MDPre-anesthesia Checklist: Patient identified, Emergency Drugs available, Suction available and Patient being monitored Patient Re-evaluated:Patient Re-evaluated prior to induction Oxygen Delivery Method: Circle System Utilized Preoxygenation: Pre-oxygenation with 100% oxygen Induction Type: IV induction Ventilation: Mask ventilation without difficulty and Oral airway inserted - appropriate to patient size Laryngoscope Size: Sabra Heck and 2 Grade View: Grade II Tube type: Oral Tube size: 7.5 mm Number of attempts: 1 Airway Equipment and Method: Stylet and Oral airway Placement Confirmation: ETT inserted through vocal cords under direct vision, positive ETCO2 and breath sounds checked- equal and bilateral Secured at: 22 cm Tube secured with: Tape Dental Injury: Teeth and Oropharynx as per pre-operative assessment

## 2022-11-18 NOTE — Anesthesia Preprocedure Evaluation (Signed)
Anesthesia Evaluation  Patient identified by MRN, date of birth, ID band Patient awake    Reviewed: Allergy & Precautions, NPO status , Patient's Chart, lab work & pertinent test results  History of Anesthesia Complications Negative for: history of anesthetic complications  Airway Mallampati: III  TM Distance: >3 FB Neck ROM: Full    Dental  (+) Missing, Poor Dentition, Chipped, Dental Advisory Given   Pulmonary asthma , former smoker   breath sounds clear to auscultation       Cardiovascular negative cardio ROS  Rhythm:Regular     Neuro/Psych Seizures -, Well Controlled,  PSYCHIATRIC DISORDERS Anxiety      Neuromuscular disease    GI/Hepatic Neg liver ROS,GERD  ,,  Endo/Other  negative endocrine ROS    Renal/GU negative Renal ROS     Musculoskeletal  (+) Arthritis ,  Right periprosthestic femur fracture   Abdominal   Peds  Hematology  (+) Blood dyscrasia, anemia Lab Results      Component                Value               Date                      WBC                      8.6                 11/18/2022                HGB                      11.1 (L)            11/18/2022                HCT                      32.9 (L)            11/18/2022                MCV                      92.2                11/18/2022                PLT                      223                 11/18/2022              Anesthesia Other Findings   Reproductive/Obstetrics                             Anesthesia Physical Anesthesia Plan  ASA: 2  Anesthesia Plan: General   Post-op Pain Management: Ofirmev IV (intra-op)*   Induction: Intravenous  PONV Risk Score and Plan: 2 and Ondansetron and Dexamethasone  Airway Management Planned: Oral ETT  Additional Equipment: None  Intra-op Plan:   Post-operative Plan: Extubation in OR  Informed Consent: I have reviewed the patients History and Physical, chart,  labs and discussed the procedure including the risks, benefits and alternatives for  the proposed anesthesia with the patient or authorized representative who has indicated his/her understanding and acceptance.     Dental advisory given  Plan Discussed with: CRNA  Anesthesia Plan Comments:        Anesthesia Quick Evaluation

## 2022-11-18 NOTE — TOC Initial Note (Signed)
Transition of Care [facility]) - Initial/Assessment Note    Patient Details  Name: Richard Glass MRN: [id number] Date of Birth: Apr 19, 1971  Transition of Care [facility]) CM/SW Contact:    Sharin Mons, RN Phone Number: [phone] 11/18/2022, 7:47 AM  Clinical Narrative:                   Presents after being hit by a tree , suffered periprosthetic right femur fracture. Hx of TBI /intracranial hemorrhage,s/p bur hole placement and seizure disorder. Pt is in buck's traction. From home . Plan: ORIF of periprosthetic fracture with possible revision femoral stem today.   Transition of Care Department [facility]) has reviewed patient. Pt with potential SNF vs home health need. TOC team  will continue to monitor patient post surgical procedure through interdisciplinary progression rounds and assist with TOC needs.    Expected Discharge Plan: [address] (vs SNF) Barriers to Discharge: Continued Medical Work up   Patient Goals and CMS Choice            Expected Discharge Plan and Services                                              Prior Living Arrangements/Services                       Activities of Daily Living Home Assistive Devices/Equipment: Cane (specify quad or straight), Walker (specify type) ADL Screening (condition at time of admission) Patient's cognitive ability adequate to safely complete daily activities?: Yes Is the patient deaf or have difficulty hearing?: No Does the patient have difficulty seeing, even when wearing glasses/contacts?: No Does the patient have difficulty concentrating, remembering, or making decisions?: No Patient able to express need for assistance with ADLs?: Yes Does the patient have difficulty dressing or bathing?: No Independently performs ADLs?: Yes (appropriate for developmental age) Does the patient have difficulty walking or climbing stairs?: No Weakness of Legs: Both Weakness of Arms/Hands:  None  Permission Sought/Granted                  Emotional Assessment              Admission diagnosis:  Trauma [T14.90XA] Closed right hip fracture ([place]) [S72.001A] Closed fracture of right hip requiring operative repair, initial encounter ([place]) [S72.001A] Patient Active Problem List   Diagnosis Date Noted   Periprosthetic fracture of femur at tip of prosthesis 11/17/2022   Lactic acidosis 11/17/2022   Chronic migraine w/o aura w/o status migrainosus, not intractable 03/14/2022   Wedge compression fracture of third thoracic vertebra, initial encounter for closed fracture ([place]) 09/14/2020   Status post right hip replacement 02/15/2020   Status post total replacement of right hip 11/22/2019   Painful orthopaedic hardware ([place]) 11/11/2019   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   Weight loss, non-intentional 10/14/2019   Bilateral leg weakness 09/03/2019   S/P right hip fracture 07/07/2019   Right leg pain 06/26/2019   Femoral fracture ([place]) 06/26/2019   Hearing loss 05/01/2019   Chronic left leg pain 11/20/2018   Allodynia, left leg 11/20/2018   History of traumatic brain injury 02/23/2018   High risk medication use, Combination of Opioid and Benzodiazapine 02/12/2018   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   Encephalomalacia on imaging study 05/01/2017  SI (sacroiliac) pain 03/20/2017   Enchondroma of left fibular head 05/17/2016   Chronic pain syndrome 01/18/2016   Anxiety state 08/08/2013   Cerumen impaction 08/06/2013   Insomnia 02/02/2013   Right flank pain 05/21/2011   Tremor 10/31/2009   Encounter for chronic pain management 07/04/2009   Restless leg 09/29/2007   Hyperlipidemia 11/13/2006   Allergic rhinitis 11/13/2006   GASTROESOPHAGEAL REFLUX, NO ESOPHAGITIS 11/13/2006   Low back pain 11/13/2006   Convulsions ([place]) 11/13/2006   Seizure disorder ([place]) 11/13/2006   PCP:  Riki Sheer, NP Pharmacy:   [id number] [id number] [id number]: [phone]: [phone]    Social Determinants of Health (SDOH) Social History: SDOH Screenings   Food Insecurity: No Food Insecurity (11/17/2022)  Housing: Low Risk  (11/17/2022)  Transportation Needs: No Transportation Needs (11/17/2022)  Utilities: Not At Risk (11/17/2022)  Depression (PHQ2-9): Low Risk  (11/11/2019)  Tobacco Use: High Risk (11/17/2022)   SDOH Interventions:     Readmission Risk Interventions     No data to display

## 2022-11-19 ENCOUNTER — Encounter (HOSPITAL_COMMUNITY): Payer: Self-pay | Admitting: Orthopaedic Surgery

## 2022-11-19 LAB — BASIC METABOLIC PANEL
Anion gap: 7 (ref 5–15)
BUN: 14 mg/dL (ref 6–20)
CO2: 25 mmol/L (ref 22–32)
Calcium: 7.6 mg/dL — ABNORMAL LOW (ref 8.9–10.3)
Chloride: 103 mmol/L (ref 98–111)
Creatinine, Ser: 1.2 mg/dL (ref 0.61–1.24)
GFR, Estimated: >60 mL/min (ref >60)
Glucose, Bld: 149 mg/dL — ABNORMAL HIGH (ref 70–99)
Potassium: 4.5 mmol/L (ref 3.5–5.1)
Sodium: 135 mmol/L (ref 135–145)

## 2022-11-19 LAB — CBC
HCT: 22.8 % — ABNORMAL LOW (ref 39.0–52.0)
Hemoglobin: 7.3 g/dL — ABNORMAL LOW (ref 13.0–17.0)
MCH: 30.3 pg (ref 26.0–34.0)
MCHC: 32 g/dL (ref 30.0–36.0)
MCV: 94.6 fL (ref 80.0–100.0)
Platelets: 186 10*3/uL (ref 150–400)
RBC: 2.41 MIL/uL — ABNORMAL LOW (ref 4.22–5.81)
RDW: 13.2 % (ref 11.5–15.5)
WBC: 12.9 10*3/uL — ABNORMAL HIGH (ref 4.0–10.5)
nRBC: 0 % (ref 0.0–0.2)

## 2022-11-19 LAB — PREPARE RBC (CROSSMATCH)

## 2022-11-19 MED ORDER — ACETAMINOPHEN 500 MG PO TABS
1000.0000 mg | ORAL_TABLET | Freq: Three times a day (TID) | ORAL | Status: DC
  Administered 2022-11-19 – 2022-11-22 (×10): 1000 mg via ORAL
  Filled 2022-11-19 (×10): qty 2

## 2022-11-19 MED ORDER — TAMSULOSIN HCL 0.4 MG PO CAPS
0.4000 mg | ORAL_CAPSULE | Freq: Every day | ORAL | Status: DC
  Administered 2022-11-19 – 2022-11-22 (×4): 0.4 mg via ORAL
  Filled 2022-11-19 (×4): qty 1

## 2022-11-19 MED ORDER — PANTOPRAZOLE SODIUM 40 MG IV SOLR
40.0000 mg | INTRAVENOUS | Status: DC
  Administered 2022-11-19 – 2022-11-22 (×4): 40 mg via INTRAVENOUS
  Filled 2022-11-19 (×4): qty 10

## 2022-11-19 MED ORDER — SODIUM CHLORIDE 0.9% IV SOLUTION: Freq: Once | INTRAVENOUS | Status: AC

## 2022-11-19 MED ORDER — CHLORHEXIDINE GLUCONATE CLOTH 2 % EX PADS
6.0000 | MEDICATED_PAD | Freq: Every day | CUTANEOUS | Status: DC
  Administered 2022-11-19 – 2022-11-22 (×4): 6 via TOPICAL

## 2022-11-19 MED ORDER — LIDOCAINE 5 % EX PTCH
1.0000 | MEDICATED_PATCH | CUTANEOUS | Status: DC
  Administered 2022-11-19 – 2022-11-22 (×3): 1 via TRANSDERMAL
  Filled 2022-11-19 (×4): qty 1

## 2022-11-19 NOTE — Plan of Care (Addendum)
Patient complaining of chest pains, not radiating, tight, constant, states it's been that way since last night, heart rate has been elevated 100-120s, temperature slightly elevated, VS currently BP 128/82 MAP 92 RESP 15 O2 98 [place] 2L HR 107 temp 99.7 oral, notified Dr. Pietro Cassis, ordered EKG   Problem: Education: Goal: Knowledge of General Education information will improve Description: Including pain rating scale, medication(s)/side effects and non-pharmacologic comfort measures Outcome: Progressing   Problem: Activity: Goal: Risk for activity intolerance will decrease Outcome: Progressing   Problem: Pain Managment: Goal: General experience of comfort will improve Outcome: Progressing   Problem: Safety: Goal: Ability to remain free from injury will improve Outcome: Progressing   Problem: Skin Integrity: Goal: Risk for impaired skin integrity will decrease Outcome: Progressing

## 2022-11-19 NOTE — NC FL2 (Signed)
[place] LEVEL OF CARE FORM     IDENTIFICATION  Patient Name: Richard Glass Birthdate: 23-Mar-1971 Sex: male Admission Date (Current Location): 11/17/2022  [facility] and [place] Number:  Herbalist and Address:  [facility], [place] [address] [place], [place] [address]      Provider Number: [id number]  Attending Physician Name and Address:  Terrilee Croak, MD  Relative Name and Phone Number:  SMITH,CHRIS Denman George   [phone]    Current Level of Care: Hospital Recommended Level of Care: [place] Prior Approval Number:    Date Approved/Denied:   PASRR Number: [id number]  Discharge Plan: SNF    Current Diagnoses: Patient Active Problem List   Diagnosis Date Noted   Closed fracture of lateral portion of right tibial plateau 11/18/2022   Periprosthetic fracture of femur at tip of prosthesis 11/17/2022   Lactic acidosis 11/17/2022   Chronic migraine w/o aura w/o status migrainosus, not intractable 03/14/2022   Wedge compression fracture of third thoracic vertebra, initial encounter for closed fracture ([place]) 09/14/2020   Status post right hip replacement 02/15/2020   Status post total replacement of right hip 11/22/2019   Painful orthopaedic hardware ([place]) 11/11/2019   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   Weight loss, non-intentional 10/14/2019   Bilateral leg weakness 09/03/2019   S/P right hip fracture 07/07/2019   Right leg pain 06/26/2019   Femoral fracture (HCC) 06/26/2019   Hearing loss 05/01/2019   Chronic left leg pain 11/20/2018   Allodynia, left leg 11/20/2018   History of traumatic brain injury 02/23/2018   High risk medication use, Combination of Opioid and Benzodiazapine 02/12/2018   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   Encephalomalacia on imaging study 05/01/2017   SI (sacroiliac) pain 03/20/2017   Enchondroma of left fibular head 05/17/2016   Chronic pain  syndrome 01/18/2016   Anxiety state 08/08/2013   Cerumen impaction 08/06/2013   Insomnia 02/02/2013   Right flank pain 05/21/2011   Tremor 10/31/2009   Encounter for chronic pain management 07/04/2009   Restless leg 09/29/2007   Hyperlipidemia 11/13/2006   Allergic rhinitis 11/13/2006   GASTROESOPHAGEAL REFLUX, NO ESOPHAGITIS 11/13/2006   Low back pain 11/13/2006   Convulsions ([place]) 11/13/2006   Seizure disorder ([place]) 11/13/2006    Orientation RESPIRATION BLADDER Height & Weight     Self, Time, Situation, Place  O2 Continent, Indwelling catheter Weight: 177 lb (80.3 kg) Height:  '5\' 8"'$  (172.7 cm)  BEHAVIORAL SYMPTOMS/MOOD NEUROLOGICAL BOWEL NUTRITION STATUS    Convulsions/Seizures Continent Diet  AMBULATORY STATUS COMMUNICATION OF NEEDS Skin   Total Care Verbally Surgical wounds                       Personal Care Assistance Level of Assistance  Bathing, Feeding, Dressing Bathing Assistance: Limited assistance Feeding assistance: Independent Dressing Assistance: Limited assistance     Functional Limitations Info  Sight, Hearing, Speech Sight Info: Adequate Hearing Info: Adequate Speech Info: Adequate    SPECIAL CARE FACTORS FREQUENCY  PT (By licensed PT), OT (By licensed OT)     PT Frequency: 5x week OT Frequency: 5x week            Contractures Contractures Info: Not present    Additional Factors Info  Code Status, Allergies Code Status Info: full Allergies Info: ibuprofin           Current Medications (11/19/2022):  This is the current hospital  active medication list Current Facility-Administered Medications  Medication Dose Route Frequency Provider Last Rate Last Admin   0.9 %  sodium chloride infusion   Intravenous Continuous Leandrew Koyanagi, MD 75 mL/hr at 11/18/22 2056 New Bag at 11/18/22 2056   acetaminophen (TYLENOL) tablet 1,000 mg  1,000 mg Oral Q8H Dahal, Binaya, MD       albuterol (PROVENTIL) (2.5 MG/3ML) 0.083% nebulizer solution 2.5 mg   2.5 mg Nebulization Q6H PRN Leandrew Koyanagi, MD       ALPRAZolam [name]) tablet 1 mg  1 mg Oral Daily PRN Leandrew Koyanagi, MD       alum & mag hydroxide-simeth (MAALOX/MYLANTA) 200-200-20 MG/5ML suspension 30 mL  30 mL Oral Q4H PRN Leandrew Koyanagi, MD       Chlorhexidine Gluconate Cloth 2 % PADS 6 each  6 each Topical Daily Dahal, Binaya, MD   6 each at 11/19/22 1004   diclofenac Sodium (VOLTAREN) 1 % topical gel 2 g  2 g Topical Daily PRN Leandrew Koyanagi, MD       docusate sodium (COLACE) capsule 100 mg  100 mg Oral BID Leandrew Koyanagi, MD   100 mg at 11/19/22 0816   fluticasone (FLONASE) 50 MCG/ACT nasal spray 2 spray  2 spray Each Nare Daily PRN Leandrew Koyanagi, MD       gabapentin (NEURONTIN) capsule 1,200 mg  1,200 mg Oral QHS Leandrew Koyanagi, MD   1,200 mg at 11/18/22 2102   gabapentin (NEURONTIN) capsule 600 mg  600 mg Oral q morning Leandrew Koyanagi, MD   600 mg at 11/19/22 0815   HYDROmorphone (DILAUDID) injection 0.5-1 mg  0.5-1 mg Intravenous Q4H PRN Leandrew Koyanagi, MD   1 mg at 11/19/22 1232   hydrOXYzine (ATARAX) tablet 25 mg  25 mg Oral TID PRN Leandrew Koyanagi, MD       lidocaine (LIDODERM) 5 % 1 patch  1 patch Transdermal Q24H Dahal, Marlowe Aschoff, MD   1 patch at 11/19/22 1000   loratadine (CLARITIN) tablet 10 mg  10 mg Oral Daily Leandrew Koyanagi, MD   10 mg at 11/19/22 0816   magnesium citrate solution 1 Bottle  1 Bottle Oral Once PRN Leandrew Koyanagi, MD       menthol-cetylpyridinium (CEPACOL) lozenge 3 mg  1 lozenge Oral PRN Leandrew Koyanagi, MD       Or   phenol (CHLORASEPTIC) mouth spray 1 spray  1 spray Mouth/Throat PRN Leandrew Koyanagi, MD       methocarbamol (ROBAXIN) tablet 500 mg  500 mg Oral Q6H PRN Leandrew Koyanagi, MD   500 mg at 11/19/22 [id number]   Or   methocarbamol (ROBAXIN) 500 mg in dextrose 5 % 50 mL IVPB  500 mg Intravenous Q6H PRN Leandrew Koyanagi, MD       ondansetron [facility]) injection 4 mg  4 mg Intravenous Q6H PRN Leandrew Koyanagi, MD       ondansetron [facility]) tablet 4 mg  4 mg Oral Q6H PRN Leandrew Koyanagi, MD       Or   ondansetron [facility]) injection 4 mg  4 mg Intravenous Q6H PRN Leandrew Koyanagi, MD       oxyCODONE (Oxy IR/ROXICODONE) immediate release tablet 5-10 mg  5-10 mg Oral Q4H PRN Leandrew Koyanagi, MD   5 mg at 11/19/22 0519   pantoprazole (PROTONIX) injection 40 mg  40 mg Intravenous Q24H Terrilee Croak, MD  40 mg at 11/19/22 0958   PHENobarbital (LUMINAL) tablet 194.4 mg  194.4 mg Oral QHS Leandrew Koyanagi, MD   194.4 mg at 11/18/22 2101   polyethylene glycol (MIRALAX / GLYCOLAX) packet 17 g  17 g Oral Daily PRN Leandrew Koyanagi, MD       sorbitol 70 % solution 30 mL  30 mL Oral Daily PRN Leandrew Koyanagi, MD       SUMAtriptan (IMITREX) tablet 50 mg  50 mg Oral Q2H PRN Leandrew Koyanagi, MD       tamsulosin (FLOMAX) capsule 0.4 mg  0.4 mg Oral QPC breakfast Dahal, Binaya, MD       zonisamide (ZONEGRAN) capsule 150 mg  150 mg Oral BID Leandrew Koyanagi, MD   150 mg at 11/19/22 [id number]     Discharge Medications: Please see discharge summary for a list of discharge medications.  Relevant Imaging Results:  Relevant Lab Results:   Additional Information SSN: [id number]. Pt is vaccinated for covid with boosters.  Joanne Chars, LCSW

## 2022-11-19 NOTE — Plan of Care (Signed)
?  Problem: Clinical Measurements: ?Goal: Ability to maintain clinical measurements within normal limits will improve ?Outcome: Progressing ?Goal: Will remain free from infection ?Outcome: Progressing ?Goal: Diagnostic test results will improve ?Outcome: Progressing ?  ?

## 2022-11-19 NOTE — TOC Initial Note (Signed)
Transition of Care [facility]) - Initial/Assessment Note    Patient Details  Name: Richard Glass MRN: [id number] Date of Birth: 09/18/1970  [facility]) CM/SW Contact:    Joanne Chars, LCSW Phone Number: 11/19/2022, 1:44 PM  Clinical Narrative:     CSW met with pt regarding PT recommendation for SNF.  Pt lives in garage apartment, his contact   Margaretmary Eddy lives with his family in the house.  Permission given to speak with Gerald Stabs and with Peter Congo, also listed contact.  No current services.  Pt agreeable to SNF, medicare choice document provided, permission given to send out referral in hub.  Pt is vaccinated for covid with boosters.  Referral sent out in hub for SNF.             Expected Discharge Plan: Skilled Nursing Facility Barriers to Discharge: SNF Pending bed offer   Patient Goals and CMS Choice   CMS Medicare.gov Compare Post Acute Care list provided to:: Patient Choice offered to / list presented to : Patient      Expected Discharge Plan and Services In-house Referral: Clinical Social Work   Post Acute Care Choice: [place] Living arrangements for the past 2 months: Apartment                                      Prior Living Arrangements/Services Living arrangements for the past 2 months: Apartment Lives with:: Self (lives in garage apartment, friend Gerald Stabs and his family lives in main house) Patient language and need for interpreter reviewed:: Yes Do you feel safe going back to the place where you live?: Yes      Need for Family Participation in Patient Care: No (Comment) Care giver support system in place?: Yes (comment) Current home services: Other (comment) (none) Criminal Activity/Legal Involvement Pertinent to Current Situation/Hospitalization: No - Comment as needed  Activities of Daily Living Home Assistive Devices/Equipment: Cane (specify quad or straight), Walker (specify type) ADL Screening (condition at time of  admission) Patient's cognitive ability adequate to safely complete daily activities?: Yes Is the patient deaf or have difficulty hearing?: No Does the patient have difficulty seeing, even when wearing glasses/contacts?: No Does the patient have difficulty concentrating, remembering, or making decisions?: No Patient able to express need for assistance with ADLs?: Yes Does the patient have difficulty dressing or bathing?: No Independently performs ADLs?: Yes (appropriate for developmental age) Does the patient have difficulty walking or climbing stairs?: No Weakness of Legs: Both Weakness of Arms/Hands: None  Permission Sought/Granted Permission sought to share information with : Family Supports Permission granted to share information with : Yes, Verbal Permission Granted  Share Information with NAME: friends Gerald Stabs and [place] granted to share info w AGENCY: SNF        Emotional Assessment Appearance:: Appears older than stated age Attitude/Demeanor/Rapport: Engaged Affect (typically observed): Appropriate Orientation: : Oriented to Self, Oriented to Place, Oriented to  Time, Oriented to Situation      Admission diagnosis:  Trauma [T14.90XA] Closed right hip fracture ([place]) [S72.001A] Closed fracture of right hip requiring operative repair, initial encounter [facility]) [S72.001A] Patient Active Problem List   Diagnosis Date Noted   Closed fracture of lateral portion of right tibial plateau 11/18/2022   Periprosthetic fracture of femur at tip of prosthesis 11/17/2022   Lactic acidosis 11/17/2022   Chronic migraine w/o aura w/o status migrainosus, not intractable  03/14/2022   Wedge compression fracture of third thoracic vertebra, initial encounter for closed fracture ([place]) 09/14/2020   Status post right hip replacement 02/15/2020   Status post total replacement of right hip 11/22/2019   Painful orthopaedic hardware ([place]) 11/11/2019   Closed displaced fracture of right  femoral neck with nonunion 11/11/2019   Weight loss, non-intentional 10/14/2019   Bilateral leg weakness 09/03/2019   S/P right hip fracture 07/07/2019   Right leg pain 06/26/2019   Femoral fracture ([place]) 06/26/2019   Hearing loss 05/01/2019   Chronic left leg pain 11/20/2018   Allodynia, left leg 11/20/2018   History of traumatic brain injury 02/23/2018   High risk medication use, Combination of Opioid and Benzodiazapine 02/12/2018   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   Encephalomalacia on imaging study 05/01/2017   SI (sacroiliac) pain 03/20/2017   Enchondroma of left fibular head 05/17/2016   Chronic pain syndrome 01/18/2016   Anxiety state 08/08/2013   Cerumen impaction 08/06/2013   Insomnia 02/02/2013   Right flank pain 05/21/2011   Tremor 10/31/2009   Encounter for chronic pain management 07/04/2009   Restless leg 09/29/2007   Hyperlipidemia 11/13/2006   Allergic rhinitis 11/13/2006   GASTROESOPHAGEAL REFLUX, NO ESOPHAGITIS 11/13/2006   Low back pain 11/13/2006   Convulsions ([place]) 11/13/2006   Seizure disorder ([place]) 11/13/2006   PCP:  Riki Sheer, NP Pharmacy:   [id number] [place] [id number] [id number]: [phone]: [phone]    Social Determinants of Health (SDOH) Social History: SDOH Screenings   Food Insecurity: No Food Insecurity (11/17/2022)  Housing: Low Risk  (11/17/2022)  Transportation Needs: No Transportation Needs (11/17/2022)  Utilities: Not At Risk (11/17/2022)  Depression (PHQ2-9): Low Risk  (11/11/2019)  Tobacco Use: High Risk (11/18/2022)   SDOH Interventions:     Readmission Risk Interventions     No data to display

## 2022-11-19 NOTE — Anesthesia Postprocedure Evaluation (Signed)
Anesthesia Post Note  Patient: Richard Glass  Procedure(s) Performed: OPEN REDUCTION INTERNAL FIXATION (ORIF) PERIPROSTHETIC FRACTURE (Right)     Patient location during evaluation: PACU Anesthesia Type: General Level of consciousness: awake and alert Pain management: pain level controlled Vital Signs Assessment: post-procedure vital signs reviewed and stable Respiratory status: spontaneous breathing, nonlabored ventilation, respiratory function stable and patient connected to nasal cannula oxygen Cardiovascular status: blood pressure returned to baseline and stable Postop Assessment: no apparent nausea or vomiting Anesthetic complications: no   No notable events documented.            Effie Berkshire

## 2022-11-19 NOTE — Progress Notes (Signed)
PROGRESS NOTE  Richard Glass  DOB: 29-Mar-1971  PCP: Riki Sheer, NP [id number]  DOA: 11/17/2022  LOS: 2 days  Hospital Day: 3  Brief narrative: Richard Glass is a 52 y.o. male with PMH significant for traumatic ICH at the age of 5, seizure, chronic back pain, anxiety. 3/3, patient was brought to the ED after an injury at work. 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  He had immediate right leg pain and could not get up.  He did not pass out. He had total right hip replacement by Dr. Erlinda Hong previously on 11/2019.    In the ED, hemodynamically stable X-rays significant for a oblique mildly displaced and angulated fracture of the proximal femoral shaft centered at the tip of the femoral component of the right hip arthroplasty, and question of a subtle right lateral tibial plateau fracture with small joint effusion.    CT scan of the head, cervical spine, chest, abdomen, and pelvis did not note any other acute abnormality.   EDP contacted Dr. Laurance Flatten of orthopedics. Admitted to [facility]  Subjective: Patient was seen and examined this morning.   Lying on bed.  Not in distress.  Has been complaining of chest pain since this morning.   Family not at bedside.  Assessment and plan: Right periprosthetic hip fracture S/p ORIF -Dr. Erlinda Hong 3/4. Presented after an injury at work site. Pending PT eval Continue pain control with scheduled Tylenol, as needed oxycodone as needed IV Dilaudid. DVT prophylaxis with SCDs currently.  I will hold off on Lovenox till hemoglobin improves.   Right tibial plateau fracture nonoperative management plan per orthopedics  Chest pain Reported chest pain since last night. EKG unremarkable. On exam, patient has tenderness to touch anteriorly in the left side. CT scan obtained on admission did not show any rib fractures. Probably muscle and ligament injury related to trauma at presentation.  Continue pain  management. Also started on Protonix  Acute blood loss anemia Hemoglobin at baseline over 13.  Low at 7.3 this morning probably from blood loss related to long bone fracture as well as surgery.  I discussed with patient regarding blood transfusion and he consented to it.  Order for 1 unit of PRBC transfusion today. Recent Labs    11/17/22 1406 11/17/22 1418 11/18/22 0358 11/18/22 1552 11/19/22 0335  HGB 13.1 13.6 11.1* 9.5* 7.3*  MCV 93.3  --  92.2  --  94.6   Lactic acidosis Rhabdomyolysis Mildly elevated WBC count and lactic acid probably due to muscle injury and pain. No evidence of infection.  Labs normalized. Lactic acid level normalized. CK level was elevated as well.  Continue IV hydration.  Repeat CK level tomorrow. Recent Labs  Lab 11/17/22 1406 11/17/22 1450 11/17/22 2106 11/18/22 0358 11/19/22 0335  WBC 10.6*  --   --  8.6 12.9*  LATICACIDVEN  --  2.7* 1.0  --   --    Recent Labs  Lab 11/18/22 0358  CKTOTAL 407*    Acute urinary retention 3/4, noted to have more than a liter of urinary retention.  Foley catheter was inserted.  I would maintain Foley catheter for next 3 to 4 days before voiding trial.  May have underlying BPH.  Start Flomax 0.4 mg daily.  History of traumatic brain injury History of seizure For history, patient got hit in the head with a golf ball at the age of 62 that caused intercranial  bleeding for which he had to have bur holes placed for which he has a history of seizure disorder.  He reports that he has not had any seizures in over a year. Continue AEDs. PTA on zonisamide 150 mg twice daily, Neurontin 200 mg 3 times daily, phenobarbital at bedtime Continue all.  Chronic headache Emgality monthly, rizatriptan PRN   Mobility: Needs PT eval postprocedure  Goals of care   Code Status: Full Code     DVT prophylaxis: SCD boots for now. SCDs Start: 11/18/22 1859 Place TED hose Start: 11/18/22 1859   Antimicrobials: None Fluid:  None Consultants: Orthopedics Family Communication: None at bedside  Status is: Inpatient Level of care: Telemetry Surgical   Dispo: Patient is from: Home              Anticipated d/c is to: Pending clinical course Continue in-hospital care because: POD 1, pending PT eval   Scheduled Meds:  sodium chloride   Intravenous Once   acetaminophen  1,000 mg Oral Q8H   Chlorhexidine Gluconate Cloth  6 each Topical Daily   docusate sodium  100 mg Oral BID   gabapentin  1,200 mg Oral QHS   gabapentin  600 mg Oral q morning   lidocaine  1 patch Transdermal Q24H   loratadine  10 mg Oral Daily   pantoprazole (PROTONIX) IV  40 mg Intravenous Q24H   PHENobarbital  194.4 mg Oral QHS   tamsulosin  0.4 mg Oral QPC breakfast   zonisamide  150 mg Oral BID    PRN meds: albuterol, ALPRAZolam, alum & mag hydroxide-simeth, diclofenac Sodium, fluticasone, HYDROmorphone (DILAUDID) injection, hydrOXYzine, magnesium citrate, menthol-cetylpyridinium **OR** phenol, methocarbamol **OR** methocarbamol (ROBAXIN) IV, ondansetron (ZOFRAN) IV, ondansetron **OR** ondansetron (ZOFRAN) IV, oxyCODONE, polyethylene glycol, sorbitol, SUMAtriptan   Infusions:   sodium chloride 75 mL/hr at 11/18/22 2056   methocarbamol (ROBAXIN) IV      Diet:  Diet Order             Diet Carb Modified Fluid consistency: Thin; Room service appropriate? Yes  Diet effective now                   Antimicrobials: Anti-infectives (From admission, onward)    Start     Dose/Rate Route Frequency Ordered Stop   11/18/22 2000  ceFAZolin (ANCEF) IVPB 2g/100 mL premix        2 g 200 mL/hr over 30 Minutes Intravenous Every 6 hours 11/18/22 1858 11/19/22 1027   11/18/22 1645  vancomycin (VANCOCIN) powder  Status:  Discontinued          As needed 11/18/22 1645 11/18/22 1739   11/18/22 1230  ceFAZolin (ANCEF) IVPB 2g/100 mL premix        2 g 200 mL/hr over 30 Minutes Intravenous On call to O.R. 11/18/22 1218 11/18/22 1425   11/18/22  1219  ceFAZolin (ANCEF) 2-4 GM/100ML-% IVPB       Note to Pharmacy: Cameron Sprang M: cabinet override      11/18/22 1219 11/18/22 1423       Skin assessment:       Nutritional status:  Body mass index is 26.91 kg/m.          Objective: Vitals:   11/19/22 0530 11/19/22 0718  BP:  136/84  Pulse: (!) 109 (!) 106  Resp:  15  Temp:  99.7 F (37.6 C)  SpO2: 99% 98%    Intake/Output Summary (Last 24 hours) at 11/19/2022 1203 Last data filed at 11/19/2022  0700 Gross per 24 hour  Intake 2300 ml  Output 2625 ml  Net -325 ml   Filed Weights   11/17/22 1505 11/18/22 1220  Weight: 79.4 kg 80.3 kg   Weight change: 0.907 kg Body mass index is 26.91 kg/m.   Physical Exam: General exam: Pleasant, middle-aged male.  Partially controlled pain Skin: No rashes, lesions or ulcers. HEENT: Atraumatic, normocephalic, no obvious bleeding Lungs: Clear to auscultation bilaterally.  Tender to touch on left anterior chest wall. CVS: Regular rate and rhythm, no murmur GI/Abd soft, nontender, nondistended, bowel sound present CNS: Somnolent, opens eyes on verbal command. Psychiatry: Mood appropriate Extremities: No pedal edema, no calf tenderness  Data Review: I have personally reviewed the laboratory data and studies available.  F/u labs ordered Unresulted Labs (From admission, onward)     Start     Ordered   11/25/22 0500  Creatinine, serum  (enoxaparin (LOVENOX)  CrCl >/= 30 mL/min  )  Weekly,   R     Comments: while on enoxaparin therapy.   Question:  Specimen collection method  Answer:  Lab=Lab collect   11/18/22 1858   11/20/22 0500  CK  Tomorrow morning,   R       Question:  Specimen collection method  Answer:  Lab=Lab collect   11/19/22 1202   11/19/22 1202  Prepare RBC (crossmatch)  (Blood Administration Adult)  Once,   R       Question Answer Comment  # of Units 1 unit   Transfusion Indications Hemoglobin 8 gm/dL or less and orthopedic or cardiac surgery or  pre-existing cardiac condition   Number of Units to Keep Ahead NO units ahead   If emergent release call blood bank Not emergent release      11/19/22 1201   11/19/22 0500  CBC  Daily,   R     Comments: For 3 days.   Question:  Specimen collection method  Answer:  Lab=Lab collect   11/18/22 1858   11/19/22 [id number]  Basic metabolic panel  Daily,   R     Comments: For 2 days .   Question:  Specimen collection method  Answer:  Lab=Lab collect   11/18/22 1858   11/17/22 1406  Urinalysis, Routine w reflex microscopic -Urine, Clean Catch  (Trauma Panel)  Once,   URGENT       Question:  Specimen Source  Answer:  Urine, Clean Catch   11/17/22 1406            Total time spent in review of labs and imaging, patient evaluation, formulation of plan, documentation and communication with family: 18 minutes  Signed, Terrilee Croak, MD [facility] 11/19/2022

## 2022-11-19 NOTE — Evaluation (Signed)
Physical Therapy Evaluation Patient Details Name: Richard Glass MRN: [id number] DOB: 1971/07/01 Today's Date: 11/19/2022  History of Present Illness  Richard Glass is a 52 y.o. male admitted 3/3 after sustaining Right periprosthetic hip fracture  S/p ORIF -Dr. Erlinda Hong 3/4.  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  Clinical Impression  Pt admitted with above complications, s/p Rt femur ORIF of periprosthetic fx. Mod assist for bed mobility due to RLE pain and guarding. Min assist for sit<>stand transfer with RW for support and pivot to recliner. SpO2 88-90% on RA, 100% with 2L supplemental O2, HR in 120s during transfer training. Educated on LE exercises. Limited support, agreeable to SNF to improve independence before returning home. 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 Pt currently with functional limitations due to the deficits listed below (see PT Problem List). Pt will benefit from skilled PT to increase their independence and safety with mobility to allow discharge to the venue listed below.          Recommendations for follow up therapy are one component of a multi-disciplinary discharge planning process, led by the attending physician.  Recommendations may be updated based on patient status, additional functional criteria and insurance authorization.  Follow Up Recommendations Skilled nursing-short term rehab (<3 hours/day) Can patient physically be transported by private vehicle: Yes    Assistance Recommended at Discharge Frequent or constant Supervision/Assistance  Patient can return home with the following  A little help with walking and/or transfers;A little help with bathing/dressing/bathroom;Assistance with cooking/housework;Direct supervision/assist for medications management;Direct supervision/assist for financial management;Assist for transportation;Help  with stairs or ramp for entrance    Equipment Recommendations None recommended by PT  Recommendations for Other Services       Functional Status Assessment Patient has had a recent decline in their functional status and demonstrates the ability to make significant improvements in function in a reasonable and predictable amount of time.     Precautions / Restrictions Precautions Precautions: Fall Restrictions Weight Bearing Restrictions: Yes RLE Weight Bearing: Touchdown weight bearing      Mobility  Bed Mobility Overal bed mobility: Needs Assistance Bed Mobility: Supine to Sit     Supine to sit: Mod assist, HOB elevated     General bed mobility comments: Mod assist for RLE support to EOB, patient able to slowly pull through rail and thrapist hand. Pain limited.    Transfers Overall transfer level: Needs assistance Equipment used: Rolling walker (2 wheels) Transfers: Sit to/from Stand, Bed to chair/wheelchair/BSC Sit to Stand: Min assist Stand pivot transfers: Min assist         General transfer comment: Min assist for boost to stand from bed with cues for hand placement. VC for sequencing with CGA for pivot towards right to recliner and assist to lower into recliner with RLE elevation. Pt maintains TTWB throughout training.    Ambulation/Gait                  Stairs            Wheelchair Mobility    Modified Rankin (Stroke Patients Only)       Balance Overall balance assessment: Needs assistance Sitting-balance support: No upper extremity supported, Feet supported Sitting balance-Leahy Scale: Fair     Standing balance support: During functional activity, Bilateral upper extremity supported, Reliant on assistive device for balance  Standing balance-Leahy Scale: Poor                               Pertinent Vitals/Pain Pain Assessment Pain Assessment: 0-10 Pain Score: 7  Pain Location: right hip Pain Descriptors / Indicators:  Aching, Constant Pain Intervention(s): Monitored during session, Repositioned    Home Living Family/patient expects to be discharged to:: Private residence Living Arrangements: Non-relatives/Friends Available Help at Discharge: Friend(s);Neighbor;Available 24 hours/day Type of Home: Other(Comment) (Garage) Home Access: Level entry     Alternate Level Stairs-Number of Steps: 3 Home Layout: Two level;Bed/bath upstairs Home Equipment: Rolling Walker (2 wheels);BSC/3in1;Wheelchair - manual      Prior Function Prior Level of Function : Independent/Modified Independent;Driving;Working/employed             Mobility Comments: Works off and on at a farm; 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 ADLs Comments: Ind     Hand Dominance   Dominant Hand: Right    Extremity/Trunk Assessment   Upper Extremity Assessment Upper Extremity Assessment: Defer to OT evaluation    Lower Extremity Assessment Lower Extremity Assessment: RLE deficits/detail RLE Deficits / Details: Limited assessment, pain inhibited. As expected weakness and guarding post-op. RLE: Unable to fully assess due to pain RLE Sensation: WNL       Communication   Communication: No difficulties  Cognition Arousal/Alertness: Awake/alert Behavior During Therapy: WFL for tasks assessed/performed Overall Cognitive Status: Within Functional Limits for tasks assessed                                 General Comments: Unsure of cognitive baseline. Seems to potentially have pre-existing deficits but is oriented x4        General Comments General comments (skin integrity, edema, etc.): Educated on precautions    Exercises General Exercises - Lower Extremity Ankle Circles/Pumps: AROM, Both, 10 reps, Seated Quad Sets: Strengthening, Both, 10 reps, Seated Gluteal Sets: Strengthening, Both, 10 reps, Seated   Assessment/Plan    PT Assessment Patient needs continued PT services  PT Problem List Decreased  strength;Decreased range of motion;Decreased activity tolerance;Decreased balance;Decreased mobility;Decreased knowledge of use of DME;Pain       PT Treatment Interventions DME instruction;Gait training;Functional mobility training;Therapeutic activities;Therapeutic exercise;Balance training;Neuromuscular re-education;Patient/family education    PT Goals (Current goals can be found in the Care Plan section)  Acute Rehab PT Goals Patient Stated Goal: Get well, reduce pain, back to farm to work PT Goal Formulation: With patient Time For Goal Achievement: 12/03/22 Potential to Achieve Goals: Good    Frequency Min 3X/week     Co-evaluation               AM-PAC PT "6 Clicks" Mobility  Outcome Measure Help needed turning from your back to your side while in a flat bed without using bedrails?: A Little Help needed moving from lying on your back to sitting on the side of a flat bed without using bedrails?: A Lot Help needed moving to and from a bed to a chair (including a wheelchair)?: A Little Help needed standing up from a chair using your arms (e.g., wheelchair or bedside chair)?: A Little Help needed to walk in hospital room?: A Little Help needed climbing 3-5 steps with a railing? : A Lot 6 Click Score: 16    End of Session Equipment Utilized During Treatment: Gait belt Activity Tolerance: Patient  tolerated treatment well Patient left: in chair;with call bell/phone within reach;with nursing/sitter in room;with SCD's reapplied;with chair alarm set Nurse Communication: Mobility status;Patient requests pain meds PT Visit Diagnosis: Other abnormalities of gait and mobility (R26.89);Difficulty in walking, not elsewhere classified (R26.2);Pain Pain - Right/Left: Right Pain - part of body: Hip    Time: 1100-1136 PT Time Calculation (min) (ACUTE ONLY): 36 min   Charges:   PT Evaluation $PT Eval Low Complexity: 1 Low PT Treatments $Therapeutic Activity: 8-22 mins         Candie Mile, PT, DPT Physical Therapist Acute Rehabilitation Services [place]   Ellouise Newer 11/19/2022, 12:54 PM

## 2022-11-19 NOTE — Progress Notes (Signed)
   Subjective:  Patient reports pain as soreness.  No events.  Objective:   VITALS:   Vitals:   11/19/22 0500 11/19/22 0515 11/19/22 0530 11/19/22 0718  BP:  128/82  136/84  Pulse: (!) 101 (!) 101 (!) 109 (!) 110  Resp:  16    Temp:  98.1 F (36.7 C)  99.7 F (37.6 C)  TempSrc:  Axillary  Oral  SpO2: 98% 98% 99% 98%  Weight:      Height:        Neurovascular intact Sensation intact distally Intact pulses distally Dorsiflexion/Plantar flexion intact Incision: dressing C/D/I and no drainage Compartment soft   Lab Results  Component Value Date   WBC 12.9 (H) 11/19/2022   HGB 7.3 (L) 11/19/2022   HCT 22.8 (L) 11/19/2022   MCV 94.6 11/19/2022   PLT 186 11/19/2022     Assessment/Plan:  1 Day Post-Op   - Expected postop acute blood loss anemia - recommend transfusing 1 unit RBCs - Up with PT/OT - DVT ppx - SCDs, ambulation, may begin lovenox when Hgb above 8.0 - TDW operative extremity - Pain control  Eduard Roux 11/19/2022, 7:31 AM

## 2022-11-19 NOTE — Progress Notes (Signed)
Occupational Therapy Evaluation Patient Details Name: Richard Glass MRN: [id number] DOB: 1971/01/26 Today's Date: 11/19/2022   History of Present Illness Richard Glass is a 52 y.o. male admitted 3/3 after sustaining Right periprosthetic hip fracture  S/p ORIF -Dr. Erlinda Hong 3/4.  PMH significant for traumatic ICH at the age of 78, seizure, chronic back pain, anxiety.  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   Clinical Impression   Pt. S/p R femur ORIF of periprosthetic fx.  Pt greatly limited in session due to pain, required increased time and increased assistance for mobility and LB ADLs.  Pt has help at home from friend and neighbors, but would benefit greatly from SNF placement for skilled OT/PT to assist with safety, mobility, and ADL modification training prior to return to home.  Pt agreeable to SNF placement and motivated to return to PLOF.     Recommendations for follow up therapy are one component of a multi-disciplinary discharge planning process, led by the attending physician.  Recommendations may be updated based on patient status, additional functional criteria and insurance authorization.   Follow Up Recommendations  Skilled nursing-short term rehab (<3 hours/day)     Assistance Recommended at Discharge Frequent or constant Supervision/Assistance  Patient can return home with the following A little help with walking and/or transfers;A little help with bathing/dressing/bathroom;Assistance with cooking/housework;Direct supervision/assist for medications management;Direct supervision/assist for financial management;Help with stairs or ramp for entrance;Assist for transportation    Functional Status Assessment  Patient has had a recent decline in their functional status and demonstrates the ability to make significant improvements in function in a reasonable and predictable amount of time.  Equipment Recommendations  Other (comment) (sock aide, reacher)    Recommendations for Other Services        Precautions / Restrictions Precautions Precautions: Fall Restrictions Weight Bearing Restrictions: Yes RLE Weight Bearing: Touchdown weight bearing      Mobility Bed Mobility Overal bed mobility: Needs Assistance Bed Mobility: Supine to Sit     Supine to sit: Mod assist, HOB elevated     General bed mobility comments: Severe pain with movement, requires RLE support and increased time    Transfers Overall transfer level: Needs assistance Equipment used: Rolling walker (2 wheels) Transfers: Sit to/from Stand, Bed to chair/wheelchair/BSC Sit to Stand: Min assist Stand pivot transfers: Min assist                Balance Overall balance assessment: Needs assistance Sitting-balance support: No upper extremity supported, Feet supported Sitting balance-Leahy Scale: Fair                                     ADL either performed or assessed with clinical judgement   ADL Overall ADL's : Needs assistance/impaired Eating/Feeding: Independent   Grooming: Independent   Upper Body Bathing: Moderate assistance   Lower Body Bathing: Total assistance   Upper Body Dressing : Moderate assistance   Lower Body Dressing: Total assistance   Toilet Transfer: Minimal assistance   Toileting- Clothing Manipulation and Hygiene: Total assistance   Tub/ Shower Transfer: Minimal assistance   Functional mobility during ADLs: Minimal assistance       Vision         Perception     Praxis      Pertinent Vitals/Pain Pain Assessment Pain Assessment: 0-10 Pain Score: 6  Breathing: normal Pain Location: right hip Pain Descriptors / Indicators: Aching, Constant Pain Intervention(s): Monitored  during session, Premedicated before session, Repositioned     Hand Dominance Right   Extremity/Trunk Assessment Upper Extremity Assessment Upper Extremity Assessment: Overall WFL for tasks assessed   Lower Extremity Assessment Lower Extremity Assessment: Defer to PT  evaluation       Communication Communication Communication: No difficulties   Cognition Arousal/Alertness: Awake/alert Behavior During Therapy: WFL for tasks assessed/performed Overall Cognitive Status: Within Functional Limits for tasks assessed                                       General Comments       Exercises     Shoulder Instructions      Home Living Family/patient expects to be discharged to:: Private residence Living Arrangements: Non-relatives/Friends Available Help at Discharge: Friend(s);Neighbor;Available 24 hours/day Type of Home: Other(Comment) Home Access: Level entry     Home Layout: Two level;Bed/bath upstairs Alternate Level Stairs-Number of Steps: 3 Alternate Level Stairs-Rails: None Bathroom Shower/Tub: Teacher, early years/pre: Standard Bathroom Accessibility: Yes How Accessible: Accessible via walker Home Equipment: Rolling Walker (2 wheels);BSC/3in1;Wheelchair - manual;Cane - single point   Additional Comments: Pt. lives at friends house in garage, steps only used when entering house to bathe.      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 ADLs Comments: Ind        OT Problem List: Decreased strength;Decreased range of motion;Decreased activity tolerance;Impaired balance (sitting and/or standing);Pain      OT Treatment/Interventions: Self-care/ADL training;Therapeutic exercise;DME and/or AE instruction;Therapeutic activities    OT Goals(Current goals can be found in the care plan section) Acute Rehab OT Goals Patient Stated Goal: to return home at [facility] OT Goal Formulation: With patient Time For Goal Achievement: 12/03/22 Potential to Achieve Goals: Good  OT Frequency: Min 2X/week    Co-evaluation              AM-PAC OT "6  Clicks" Daily Activity     Outcome Measure Help from another person eating meals?: None Help from another person taking care of personal grooming?: None Help from another person toileting, which includes using toliet, bedpan, or urinal?: Total Help from another person bathing (including washing, rinsing, drying)?: Total Help from another person to put on and taking off regular upper body clothing?: A Lot   6 Click Score: 12   End of Session Equipment Utilized During Treatment: Rolling walker (2 wheels);Gait belt  Activity Tolerance: Patient limited by pain Patient left: in bed;with call bell/phone within reach  OT Visit Diagnosis: Unsteadiness on feet (R26.81);Other abnormalities of gait and mobility (R26.89);Pain;Muscle weakness (generalized) (M62.81) Pain - Right/Left: Right Pain - part of body: Leg                Time: [id number] OT Time Calculation (min): 25 min Charges:  OT General Charges $OT Visit: 1 Visit OT Evaluation $OT Eval Low Complexity: 1 Low OT Treatments $Self Care/Home Management : 8-22 mins  [place] 11/19/2022, 5:59 PM

## 2022-11-20 ENCOUNTER — Other Ambulatory Visit: Payer: Self-pay | Admitting: Physician Assistant

## 2022-11-20 DIAGNOSIS — I4891 Unspecified atrial fibrillation: Secondary | ICD-10-CM

## 2022-11-20 LAB — CBC
HCT: 19.7 % — ABNORMAL LOW (ref 39.0–52.0)
Hemoglobin: 6.8 g/dL — CL (ref 13.0–17.0)
MCH: 30.9 pg (ref 26.0–34.0)
MCHC: 34.5 g/dL (ref 30.0–36.0)
MCV: 89.5 fL (ref 80.0–100.0)
Platelets: 143 10*3/uL — ABNORMAL LOW (ref 150–400)
RBC: 2.2 MIL/uL — ABNORMAL LOW (ref 4.22–5.81)
RDW: 14 % (ref 11.5–15.5)
WBC: 9.7 10*3/uL (ref 4.0–10.5)
nRBC: 0 % (ref 0.0–0.2)

## 2022-11-20 LAB — BASIC METABOLIC PANEL
Anion gap: 3 — ABNORMAL LOW (ref 5–15)
BUN: 12 mg/dL (ref 6–20)
CO2: 29 mmol/L (ref 22–32)
Calcium: 7.4 mg/dL — ABNORMAL LOW (ref 8.9–10.3)
Chloride: 103 mmol/L (ref 98–111)
Creatinine, Ser: 0.95 mg/dL (ref 0.61–1.24)
GFR, Estimated: >60 mL/min (ref >60)
Glucose, Bld: 128 mg/dL — ABNORMAL HIGH (ref 70–99)
Potassium: 3.5 mmol/L (ref 3.5–5.1)
Sodium: 135 mmol/L (ref 135–145)

## 2022-11-20 LAB — CK: Total CK: 1082 U/L — ABNORMAL HIGH (ref 49–397)

## 2022-11-20 LAB — PREPARE RBC (CROSSMATCH)

## 2022-11-20 MED ORDER — DIGOXIN 0.25 MG/ML IJ SOLN
0.5000 mg | Freq: Once | INTRAMUSCULAR | Status: AC
  Administered 2022-11-20: 0.5 mg via INTRAVENOUS
  Filled 2022-11-20: qty 2

## 2022-11-20 MED ORDER — DILTIAZEM HCL-DEXTROSE 125-5 MG/125ML-% IV SOLN (PREMIX): 5.0000 mg/h | INTRAVENOUS | Status: DC

## 2022-11-20 MED ORDER — SODIUM CHLORIDE 0.9% IV SOLUTION: Freq: Once | INTRAVENOUS | Status: AC

## 2022-11-20 MED ORDER — ENOXAPARIN SODIUM 40 MG/0.4ML IJ SOSY: 40.0000 mg | PREFILLED_SYRINGE | INTRAMUSCULAR | 0 refills | Status: DC

## 2022-11-20 MED ORDER — MAGNESIUM SULFATE IN D5W 1-5 GM/100ML-% IV SOLN
1.0000 g | Freq: Once | INTRAVENOUS | Status: AC
  Administered 2022-11-21: 1 g via INTRAVENOUS
  Filled 2022-11-20: qty 100

## 2022-11-20 MED ORDER — OXYCODONE-ACETAMINOPHEN 5-325 MG PO TABS: 1.0000 | ORAL_TABLET | Freq: Four times a day (QID) | ORAL | 0 refills | Status: DC | PRN

## 2022-11-20 MED ORDER — POTASSIUM CHLORIDE 10 MEQ/100ML IV SOLN
10.0000 meq | INTRAVENOUS | Status: AC
  Administered 2022-11-21 (×2): 10 meq via INTRAVENOUS
  Filled 2022-11-20 (×2): qty 100

## 2022-11-20 MED ORDER — ENSURE ENLIVE PO LIQD
237.0000 mL | ORAL | Status: DC
  Administered 2022-11-21 – 2022-11-22 (×2): 237 mL via ORAL

## 2022-11-20 MED ORDER — DILTIAZEM HCL-DEXTROSE 125-5 MG/125ML-% IV SOLN (PREMIX)
5.0000 mg/h | INTRAVENOUS | Status: DC
  Administered 2022-11-20 (×2): 5 mg/h via INTRAVENOUS
  Administered 2022-11-21: 15 mg/h via INTRAVENOUS
  Filled 2022-11-20 (×3): qty 125

## 2022-11-20 MED ORDER — DILTIAZEM LOAD VIA INFUSION
20.0000 mg | Freq: Once | INTRAVENOUS | Status: AC
  Administered 2022-11-20: 20 mg via INTRAVENOUS
  Filled 2022-11-20: qty 20

## 2022-11-20 NOTE — Progress Notes (Signed)
Patient back into rapid a fib with normal BP. Plan to give 20 mg IV diltiazem and resume diltiazem infusion.

## 2022-11-20 NOTE — Progress Notes (Signed)
Initial Nutrition Assessment  DOCUMENTATION CODES:   Non-severe (moderate) malnutrition in context of social or environmental circumstances  INTERVENTION:  Ensure daily (prefers chocolate)  Added assist modifier to room service so patient has help ordering meals daily  Educated patient about meeting increased nutrition needs due to recent surgery    NUTRITION DIAGNOSIS:   Moderate Malnutrition related to social / environmental circumstances as evidenced by mild fat depletion, moderate muscle depletion.   GOAL:   Patient will meet greater than or equal to 90% of their needs   MONITOR:   PO intake, Supplement acceptance  REASON FOR ASSESSMENT:   Malnutrition Screening Tool    ASSESSMENT:   52 y.o. male   Admitted: R leg pain; a tree branch fell on patient leg while he was working on a roof   Dx: R periprosthetic femur fracture, s/p ORIF 3/4  PMHx: TBI due to [place] s/p bur hole placement at age 40, seizure disorder (none in the past year), chronic back pain, anxiety New dx: Patient found to be in A-fib  w/ RVR on 3/6 Wt hx: patient weight has increased from 61.7 kg to 80.3 kg in the past 3 years per wt hx; patient reports UBW 177#  Meds: colace, Protonix Labs: Glu 128  Patient started on regular diet this morning and ate 50%  of breakfast per po documentation  Pt sitting up in chair at time of visit who reports a fair appetite and states he did not get a lunch tray due to being unable to order room service. RD observed he had 2 peanut butter cups and a chocolate pudding which he identified as his lunch. Patient reports he normally eats 1.5-2 meals per day (B: bowl of cereal or McDonald's big breakfast, D: varies) which he reports is adequate for his appetite. Pt lives with a friend and their family. He is POD2 ORIF for R femur fracture and plans to go to rehab at SNF upon d/c. Patient denies chewing/swallowing difficulty.     NUTRITION - FOCUSED PHYSICAL EXAM:  Flowsheet Row  Most Recent Value  Orbital Region No depletion  Upper Arm Region Mild depletion  Thoracic and Lumbar Region No depletion  Buccal Region Mild depletion  Temple Region Moderate depletion  Clavicle Bone Region Moderate depletion  Clavicle and Acromion Bone Region Moderate depletion  Scapular Bone Region No depletion  Dorsal Hand No depletion  Patellar Region No depletion  Anterior Thigh Region No depletion  Posterior Calf Region No depletion  Edema (RD Assessment) None  Hair Reviewed  Eyes Reviewed  Mouth Reviewed  Skin Reviewed  Nails Reviewed       Diet Order:   Diet Order             Diet regular Room service appropriate? Yes; Fluid consistency: Thin  Diet effective now                   EDUCATION NEEDS:   Education needs have been addressed  Skin:  Skin Assessment: Reviewed RN Assessment  Last BM:  unknown  Height:   Ht Readings from Last 1 Encounters:  11/18/22 '5\' 8"'$  (1.727 m)    Weight:   Wt Readings from Last 1 Encounters:  11/18/22 80.3 kg     BMI:  Body mass index is 26.91 kg/m.  Estimated Nutritional Needs:   Kcal:  2000-2400 kcal/day  Protein:  96-120 g protein/day  Fluid:  2000-2400 ml/day    Trey Paula, RDN, LDN  Clinical Nutrition

## 2022-11-20 NOTE — TOC Progression Note (Signed)
Transition of Care [facility]) - Progression Note    Patient Details  Name: Richard Glass MRN: [id number] Date of Birth: 07-15-1971  Transition of Care [facility] CM/SW [place], [place] Phone Number: 11/20/2022, 10:58 AM  Clinical Narrative:     CSW spoke with patient at bedside. Patient reports PTA he comes from home with friend Gerald Stabs.CSW provided patient with SNF bed offers. Patient accepted SNF bed offer with [facility]. CSW spoke with Whitney in intake for [facility] who confirmed SNF bed for patient pending insurance authorization approval. CSW following to start insurance authorization close to patient being medically ready. CSW will continue to follow and assist with patients dc planning needs.  Expected Discharge Plan: Skilled Nursing Facility Barriers to Discharge: SNF Pending bed offer  Expected Discharge Plan and Services In-house Referral: Clinical Social Work   Post Acute Care Choice: [place] Living arrangements for the past 2 months: Apartment                                       Social Determinants of Health (SDOH) Interventions SDOH Screenings   Food Insecurity: No Food Insecurity (11/17/2022)  Housing: Low Risk  (11/17/2022)  Transportation Needs: No Transportation Needs (11/17/2022)  Utilities: Not At Risk (11/17/2022)  Depression (PHQ2-9): Low Risk  (11/11/2019)  Tobacco Use: High Risk (11/19/2022)    Readmission Risk Interventions     No data to display
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
## 2022-11-20 NOTE — Progress Notes (Signed)
Report given to [place] staff nurse, all questions and concerns were fully addressed. Pt remains alert/oriented in no apparent distress.  Pt made aware of transfer to [place] for cardizem iv gtt. As ordered.

## 2022-11-20 NOTE — Discharge Instructions (Signed)
    1. Keep dressing clean and dry 2. Elevate foot above level of the heart 3. Take Lovenox to prevent blood clots 4. Take pain meds as needed 5. Touch down weight bearing to operative extremity

## 2022-11-20 NOTE — Progress Notes (Signed)
patient hgb this morning is 6.8 per lab, attending notified,waiting for orders. Patient is asymptomatic, will continue to monitor thanks!

## 2022-11-20 NOTE — Progress Notes (Signed)
Paged attending about patient HR being high and running AFIB on  ecg monitor, attending responded and he is working on it, will continue to monitor and update the incoming day shift nurse. Patient is calm , alert and oriented x 4, tylenol and pain med given, and blood is infusing as well

## 2022-11-20 NOTE — Progress Notes (Signed)
Subjective: 2 Days Post-Op Procedure(s) (LRB): OPEN REDUCTION INTERNAL FIXATION (ORIF) PERIPROSTHETIC FRACTURE (Right) Patient reports pain as moderate.    Objective: Vital signs in last 24 hours: Temp:  [98.3 F (36.8 C)-99.2 F (37.3 C)] 98.3 F (36.8 C) (03/06 0741) Pulse Rate:  [89-160] 160 (03/06 0741) Resp:  [15-19] 19 (03/06 0741) BP: (103-140)/(63-94) 117/67 (03/06 0741) SpO2:  [86 %-97 %] 95 % (03/06 0741)  Intake/Output from previous day: 03/05 0701 - 03/06 0700 In: 1314.2 [I.V.:999.2; Blood:315] Out: 2200 [Urine:2200] Intake/Output this shift: No intake/output data recorded.  Recent Labs    11/17/22 1418 11/18/22 0358 11/18/22 1552 11/19/22 0335 11/20/22 0354  HGB 13.6 11.1* 9.5* 7.3* 6.8*   Recent Labs    11/19/22 0335 11/20/22 0354  WBC 12.9* 9.7  RBC 2.41* 2.20*  HCT 22.8* 19.7*  PLT 186 143*   Recent Labs    11/19/22 0335 11/20/22 0354  NA 135 135  K 4.5 3.5  CL 103 103  CO2 25 29  BUN 14 12  CREATININE 1.20 0.95  GLUCOSE 149* 128*  CALCIUM 7.6* 7.4*   Recent Labs    11/17/22 1859  INR 1.2    Neurologically intact Neurovascular intact Sensation intact distally Intact pulses distally Dorsiflexion/Plantar flexion intact Incision: scant drainage No cellulitis present Compartment soft   Assessment/Plan: 2 Days Post-Op Procedure(s) (LRB): OPEN REDUCTION INTERNAL FIXATION (ORIF) PERIPROSTHETIC FRACTURE (Right) PLAN Weightbearing: TDWB RLE Insicional and dressing care: Reinforce dressings as needed Orthopedic device(s): None Showering: POD 3 VTE prophylaxis: Lovenox '40mg'$  qd  2 weeks .   Pain control: percocet Follow - up plan: 2 weeks Contact information:  xu MD, Venida Jarvis PA       [name] 11/20/2022, 8:17 AM

## 2022-11-20 NOTE — Progress Notes (Signed)
MEWS Progress Note  Patient Details Name: Richard Glass MRN: [id number] DOB: October 07, 1970 Today's Date: 11/20/2022   MEWS Flowsheet Documentation:  Assess: MEWS Score Temp: 98.3 F (36.8 C) BP: 117/67 MAP (mmHg): 84 Pulse Rate: (!) 160 ECG Heart Rate: (!) 169 Resp: 19 Level of Consciousness: Alert SpO2: 95 % O2 Device: Room Air Patient Activity (if Appropriate): In bed O2 Flow Rate (L/min): 2 L/min Assess: MEWS Score MEWS Temp: 0 MEWS Systolic: 0 MEWS Pulse: 3 MEWS RR: 0 MEWS LOC: 0 MEWS Score: 3 MEWS Score Color: Yellow Assess: SIRS CRITERIA SIRS Temperature : 0 SIRS Respirations : 0 SIRS Pulse: 1 SIRS WBC: 0 SIRS Score Sum : 1 SIRS Temperature : 0 SIRS Pulse: 1 SIRS Respirations : 0 SIRS WBC: 0 SIRS Score Sum : 1 Assess: if the MEWS score is Yellow or Red Were vital signs taken at a resting state?: Yes Focused Assessment: No change from prior assessment Does the patient meet 2 or more of the SIRS criteria?: No Does the patient have a confirmed or suspected source of infection?: No MEWS guidelines implemented : Yes, yellow Treat MEWS Interventions: Considered administering scheduled or prn medications/treatments as ordered Take Vital Signs Increase Vital Sign Frequency : Yellow: Q2hr x1, continue Q4hrs until patient remains green for 12hrs Escalate MEWS: Escalate: Yellow: Discuss with charge nurse and consider notifying provider and/or RRT Notify: Charge Nurse/RN Name of Charge Nurse/RN Notified: Colletta [place], [place] 11/20/2022, 7:44 AM

## 2022-11-20 NOTE — Progress Notes (Signed)
Physical Therapy Treatment Patient Details Name: Richard Glass MRN: [id number] DOB: 08-Jul-1971 Today's Date: 11/20/2022   History of Present Illness Richard Glass is a 52 y.o. male admitted 3/3 after sustaining Right periprosthetic hip fracture  S/p ORIF; TLE [facility] -Dr. Erlinda Hong 3/4; afib with RVR 3/6, cardizem started;  PMH significant for traumatic ICH at the age of 76, seizure, chronic back pain, anxiety.  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    PT Comments    Continuing work on functional mobility and activity tolerance;  Session focused on functional transfers with careful attention to HR and O2 sat response to mobility; Noted pt needed more assist for sit to stand and for pivot transfer today -- seems that it is due to incr pain this session; O2 sats remained greater than orequal to 95% throughout session on room air, and HR max observed was 118 bpm   Recommendations for follow up therapy are one component of a multi-disciplinary discharge planning process, led by the attending physician.  Recommendations may be updated based on patient status, additional functional criteria and insurance authorization.  Follow Up Recommendations  Skilled nursing-short term rehab (<3 hours/day) Can patient physically be transported by private vehicle: Yes   Assistance Recommended at Discharge Frequent or constant Supervision/Assistance  Patient can return home with the following A little help with walking and/or transfers;A little help with bathing/dressing/bathroom;Assistance with cooking/housework;Direct supervision/assist for medications management;Direct supervision/assist for financial management;Assist for transportation;Help with stairs or ramp for entrance   Equipment Recommendations  None recommended by PT    Recommendations for Other Services       Precautions / Restrictions Precautions Precautions: Fall Restrictions Weight Bearing Restrictions: Yes RLE Weight Bearing: Touchdown weight bearing LLE Weight  Bearing: Weight bearing as tolerated     Mobility  Bed Mobility Overal bed mobility: Needs Assistance Bed Mobility: Supine to Sit     Supine to sit: Mod assist, HOB elevated     General bed mobility comments: Severe pain with movement, requires RLE support and increased time    Transfers Overall transfer level: Needs assistance Equipment used: Rolling walker (2 wheels) Transfers: Sit to/from Stand, Bed to chair/wheelchair/BSC Sit to Stand: Mod assist Stand pivot transfers: Min assist         General transfer comment: Mod assist for boost to stand from bed with cues for hand placement. VC for sequencing with minguard asssit for pivot towards left to recliner and assist to lower into recliner with RLE elevation. Pt maintains TTWB throughout training.    Ambulation/Gait                   Stairs             Wheelchair Mobility    Modified Rankin (Stroke Patients Only)       Balance     Sitting balance-Leahy Scale: Fair       Standing balance-Leahy Scale: Poor                              Cognition Arousal/Alertness: Awake/alert Behavior During Therapy: WFL for tasks assessed/performed Overall Cognitive Status: Within Functional Limits for tasks assessed                                 General Comments: Unsure of cognitive baseline. Seems to potentially have pre-existing deficits but is oriented x4  Exercises General Exercises - Lower Extremity Quad Sets: Strengthening, Both, 10 reps, Supine    General Comments General comments (skin integrity, edema, etc.): Session conducted on room air and O2 sats remained greater than or equal to 95%; HR Max 118; end of session 98bpm      Pertinent Vitals/Pain Pain Assessment Pain Assessment: 0-10 Pain Score: 7  Pain Descriptors / Indicators: Aching, Constant Pain Intervention(s): Monitored during session    Home Living                          Prior  Function            PT Goals (current goals can now be found in the care plan section) Acute Rehab PT Goals Patient Stated Goal: Get well, reduce pain, back to farm to work PT Goal Formulation: With patient Time For Goal Achievement: 12/03/22 Potential to Achieve Goals: Good Progress towards PT goals: Progressing toward goals    Frequency    Min 3X/week      PT Plan Current plan remains appropriate    Co-evaluation              AM-PAC PT "6 Clicks" Mobility   Outcome Measure  Help needed turning from your back to your side while in a flat bed without using bedrails?: A Little Help needed moving from lying on your back to sitting on the side of a flat bed without using bedrails?: A Lot Help needed moving to and from a bed to a chair (including a wheelchair)?: A Lot Help needed standing up from a chair using your arms (e.g., wheelchair or bedside chair)?: A Lot Help needed to walk in hospital room?: A Little Help needed climbing 3-5 steps with a railing? : A Lot 6 Click Score: 14    End of Session Equipment Utilized During Treatment: Gait belt Activity Tolerance: Patient tolerated treatment well Patient left: in chair;with call bell/phone within reach;with chair alarm set Nurse Communication: Mobility status PT Visit Diagnosis: Other abnormalities of gait and mobility (R26.89);Difficulty in walking, not elsewhere classified (R26.2);Pain Pain - Right/Left: Right Pain - part of body: Hip     Time: [id number] PT Time Calculation (min) (ACUTE ONLY): 25 min  Charges:  $Therapeutic Activity: 23-37 mins                     Roney Marion, PT  Acute Rehabilitation Services Office [phone]    Colletta Maryland 11/20/2022, 5:23 PM

## 2022-11-21 ENCOUNTER — Inpatient Hospital Stay (HOSPITAL_COMMUNITY): Payer: 59

## 2022-11-21 DIAGNOSIS — R9431 Abnormal electrocardiogram [ECG] [EKG]: Secondary | ICD-10-CM

## 2022-11-21 DIAGNOSIS — E440 Moderate protein-calorie malnutrition: Secondary | ICD-10-CM | POA: Insufficient documentation

## 2022-11-21 LAB — BASIC METABOLIC PANEL
Anion gap: 9 (ref 5–15)
BUN: 11 mg/dL (ref 6–20)
CO2: 24 mmol/L (ref 22–32)
Calcium: 7.7 mg/dL — ABNORMAL LOW (ref 8.9–10.3)
Chloride: 102 mmol/L (ref 98–111)
Creatinine, Ser: 0.76 mg/dL (ref 0.61–1.24)
GFR, Estimated: >60 mL/min (ref >60)
Glucose, Bld: 128 mg/dL — ABNORMAL HIGH (ref 70–99)
Potassium: 3.2 mmol/L — ABNORMAL LOW (ref 3.5–5.1)
Sodium: 135 mmol/L (ref 135–145)

## 2022-11-21 LAB — BPAM RBC
Blood Product Expiration Date: 202403082359
Blood Product Expiration Date: 202403262359
ISSUE DATE / TIME: 202403051219
ISSUE DATE / TIME: 202403060603
Unit Type and Rh: 7300
Unit Type and Rh: 9500

## 2022-11-21 LAB — CBC
HCT: 23.6 % — ABNORMAL LOW (ref 39.0–52.0)
Hemoglobin: 7.9 g/dL — ABNORMAL LOW (ref 13.0–17.0)
MCH: 30 pg (ref 26.0–34.0)
MCHC: 33.5 g/dL (ref 30.0–36.0)
MCV: 89.7 fL (ref 80.0–100.0)
Platelets: 175 10*3/uL (ref 150–400)
RBC: 2.63 MIL/uL — ABNORMAL LOW (ref 4.22–5.81)
RDW: 14.9 % (ref 11.5–15.5)
WBC: 10.4 10*3/uL (ref 4.0–10.5)
nRBC: 0 % (ref 0.0–0.2)

## 2022-11-21 LAB — TYPE AND SCREEN
ABO/RH(D): B POS
Antibody Screen: NEGATIVE
Unit division: 0
Unit division: 0

## 2022-11-21 LAB — ECHOCARDIOGRAM COMPLETE
AR max vel: 3.02 cm2
AV Area VTI: 2.99 cm2
AV Area mean vel: 2.85 cm2
AV Mean grad: 5 mmHg
AV Peak grad: 9 mmHg
Ao pk vel: 1.5 m/s
Area-P 1/2: 3.99 cm2
Calc EF: 65.5 %
Height: 68 in
Single Plane A2C EF: 66.2 %
Single Plane A4C EF: 66.3 %
Weight: 2832 oz

## 2022-11-21 LAB — MAGNESIUM: Magnesium: 2 mg/dL (ref 1.7–2.4)

## 2022-11-21 LAB — HEMOGLOBIN AND HEMATOCRIT, BLOOD
HCT: 24.4 % — ABNORMAL LOW (ref 39.0–52.0)
Hemoglobin: 8.2 g/dL — ABNORMAL LOW (ref 13.0–17.0)

## 2022-11-21 LAB — TSH: TSH: 4.614 u[IU]/mL — ABNORMAL HIGH (ref 0.350–4.500)

## 2022-11-21 MED ORDER — DILTIAZEM HCL 30 MG PO TABS
30.0000 mg | ORAL_TABLET | Freq: Two times a day (BID) | ORAL | Status: DC
  Filled 2022-11-21: qty 1

## 2022-11-21 MED ORDER — ENOXAPARIN SODIUM 40 MG/0.4ML IJ SOSY
40.0000 mg | PREFILLED_SYRINGE | Freq: Every day | INTRAMUSCULAR | Status: DC
  Administered 2022-11-21: 40 mg via SUBCUTANEOUS
  Filled 2022-11-21: qty 0.4

## 2022-11-21 MED ORDER — PERFLUTREN LIPID MICROSPHERE
1.0000 mL | INTRAVENOUS | Status: AC | PRN
  Administered 2022-11-21: 2 mL via INTRAVENOUS

## 2022-11-21 MED ORDER — DILTIAZEM HCL 30 MG PO TABS
30.0000 mg | ORAL_TABLET | Freq: Three times a day (TID) | ORAL | Status: DC
  Administered 2022-11-21 – 2022-11-22 (×3): 30 mg via ORAL
  Filled 2022-11-21 (×2): qty 1
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
## 2022-11-21 NOTE — Progress Notes (Signed)
  Pt converted to NSR. MD notified. Drip stropped,per MD   11/21/22 0555  Vitals  BP 104/68  MAP (mmHg) 76  BP Location Right Arm  BP Method Automatic  Patient Position (if appropriate) Lying  Pulse Rate 81  Pulse Rate Source Monitor  ECG Heart Rate 82  Resp 18  Level of Consciousness  Level of Consciousness Alert  MEWS COLOR  MEWS Score Color Green  Oxygen Therapy  SpO2 97 %  O2 Device Room Air  MEWS Score  MEWS Temp 0  MEWS Systolic 0  MEWS Pulse 0  MEWS RR 0  MEWS LOC 0  MEWS Score 0

## 2022-11-21 NOTE — Progress Notes (Signed)
   Pt c/o was observed during  medication time with c/o chest pain rate 6/10. EKG performed  with results indicated. Pt in A-Fib with RVR. Provided on call Dr Milus Glazier notified.Order to bolus with 20 mg Cardizem and to restart drip implemented. One mg Dilaudid PRN pain. Tolerated well with relief. Heart rate was frequently observed to increase to 140-178/min. Accordingly, Cardizem gtt rate increase as per protocol. Pt continued to be awake alert and oriented and at rest, but heart rate remained in the 160-169 range, and gtt rate again increased per protocol. Additional orders for Digoxin, KCl IV and Magnesium sulfate IV implemented. Pt tolerated treatment very well with rate improvement.    11/20/22 2117  Vitals  Temp 98.5 F (36.9 C)  Temp Source Oral  BP 109/79  MAP (mmHg) 89  BP Location Right Arm  BP Method Automatic  Patient Position (if appropriate) Lying  Pulse Rate (!) 146  Pulse Rate Source Monitor  ECG Heart Rate (!) 178  Resp (!) 22  MEWS COLOR  MEWS Score Color Red  Oxygen Therapy  SpO2 97 %  O2 Device Room Air  Pain Assessment  Pain Scale 0-10  Pain Score 6  Pain Type Acute pain  Pain Location Chest  Pain Orientation Left;Mid  Pain Descriptors / Indicators Aching  Pain Frequency Intermittent  Pain Onset On-going  Patients Stated Pain Goal 0  Pain Intervention(s) Medication (See eMAR);MD notified (Comment)  MEWS Score  MEWS Temp 0  MEWS Systolic 0  MEWS Pulse 3  MEWS RR 1  MEWS LOC 0  MEWS Score 4  Provider Notification  Provider Name/Title Opyed, MD  Date Provider Notified 11/20/22  Time Provider Notified 2115  Method of Notification Page  Notification Reason Change in status  Provider response See new orders  Date of Provider Response 11/20/22
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
## 2022-11-21 NOTE — Progress Notes (Signed)
Pharmacy note: lovenox   52 yo male s/p R hip fracture and post OR 3/4. Pharmacy consulted to dose lovenox for VTE prophylaxis  -Wt= '80mg'$  -hg= 7.9. plt= 175, CrCl ~ 100  Plan -Lovenox '40mg'$  sq qhs -Will sign off. Please contact pharmacy with any other needs.  Thank you, Hildred Laser, PharmD Clinical Pharmacist **Pharmacist phone directory can now be found on [email or web address] ([email or web address]).  Listed under [place].

## 2022-11-21 NOTE — TOC Progression Note (Signed)
Transition of Care [facility]) - Progression Note    Patient Details  Name: Richard Glass MRN: [id number] Date of Birth: 1971-01-10  Transition of Care [facility]) CM/SW [place], [place] Phone Number: 11/21/2022, 11:49 AM  Clinical Narrative:     Patients insurance authorization has been approved from 3/8-3/10. [place] ID# [phone]. Patient has SN[facility] bed at [facility].CSW will continue to follow and assist with patients dc planning needs.  Expected Discharge Plan: Skilled Nursing Facility Barriers to Discharge: SNF Pending bed offer  Expected Discharge Plan and Services In-house Referral: Clinical Social Work   Post Acute Care Choice: [place] Living arrangements for the past 2 months: Apartment                                       Social Determinants of Health (SDOH) Interventions SDOH Screenings   Food Insecurity: No Food Insecurity (11/17/2022)  Housing: Low Risk  (11/17/2022)  Transportation Needs: No Transportation Needs (11/17/2022)  Utilities: Not At Risk (11/17/2022)  Depression (PHQ2-9): Low Risk  (11/11/2019)  Tobacco Use: High Risk (11/19/2022)    Readmission Risk Interventions     No data to display

## 2022-11-21 NOTE — Progress Notes (Signed)
Echocardiogram 2D Echocardiogram has been performed.  Richard Glass 11/21/2022, 12:06 PM

## 2022-11-21 NOTE — Care Management Important Message (Signed)
Important Message  Patient Details  Name: Richard Glass MRN: [id number] Date of Birth: 11-Nov-1970   Medicare Important Message Given:  Yes     Shelda Altes 11/21/2022, 8:54 AM

## 2022-11-22 DIAGNOSIS — M62830 Muscle spasm of back: Secondary | ICD-10-CM | POA: Diagnosis not present

## 2022-11-22 DIAGNOSIS — M545 Low back pain, unspecified: Secondary | ICD-10-CM | POA: Diagnosis not present

## 2022-11-22 DIAGNOSIS — S82121A Displaced fracture of lateral condyle of right tibia, initial encounter for closed fracture: Secondary | ICD-10-CM | POA: Diagnosis not present

## 2022-11-22 DIAGNOSIS — Z7401 Bed confinement status: Secondary | ICD-10-CM | POA: Diagnosis not present

## 2022-11-22 DIAGNOSIS — M978XXD Periprosthetic fracture around other internal prosthetic joint, subsequent encounter: Secondary | ICD-10-CM | POA: Diagnosis not present

## 2022-11-22 DIAGNOSIS — R079 Chest pain, unspecified: Secondary | ICD-10-CM | POA: Diagnosis not present

## 2022-11-22 DIAGNOSIS — G8929 Other chronic pain: Secondary | ICD-10-CM | POA: Diagnosis not present

## 2022-11-22 DIAGNOSIS — R059 Cough, unspecified: Secondary | ICD-10-CM | POA: Diagnosis not present

## 2022-11-22 DIAGNOSIS — Z4789 Encounter for other orthopedic aftercare: Secondary | ICD-10-CM | POA: Diagnosis not present

## 2022-11-22 DIAGNOSIS — R531 Weakness: Secondary | ICD-10-CM | POA: Diagnosis not present

## 2022-11-22 DIAGNOSIS — D649 Anemia, unspecified: Secondary | ICD-10-CM | POA: Diagnosis not present

## 2022-11-22 DIAGNOSIS — W208XXD Other cause of strike by thrown, projected or falling object, subsequent encounter: Secondary | ICD-10-CM | POA: Diagnosis not present

## 2022-11-22 DIAGNOSIS — Z96649 Presence of unspecified artificial hip joint: Secondary | ICD-10-CM | POA: Diagnosis not present

## 2022-11-22 DIAGNOSIS — L299 Pruritus, unspecified: Secondary | ICD-10-CM | POA: Diagnosis not present

## 2022-11-22 DIAGNOSIS — G44329 Chronic post-traumatic headache, not intractable: Secondary | ICD-10-CM | POA: Diagnosis not present

## 2022-11-22 DIAGNOSIS — M9701XD Periprosthetic fracture around internal prosthetic right hip joint, subsequent encounter: Secondary | ICD-10-CM | POA: Diagnosis not present

## 2022-11-22 DIAGNOSIS — Z7189 Other specified counseling: Secondary | ICD-10-CM | POA: Diagnosis not present

## 2022-11-22 DIAGNOSIS — M25519 Pain in unspecified shoulder: Secondary | ICD-10-CM | POA: Diagnosis not present

## 2022-11-22 DIAGNOSIS — Z743 Need for continuous supervision: Secondary | ICD-10-CM | POA: Diagnosis not present

## 2022-11-22 DIAGNOSIS — E785 Hyperlipidemia, unspecified: Secondary | ICD-10-CM | POA: Diagnosis not present

## 2022-11-22 DIAGNOSIS — G43909 Migraine, unspecified, not intractable, without status migrainosus: Secondary | ICD-10-CM | POA: Diagnosis not present

## 2022-11-22 DIAGNOSIS — M542 Cervicalgia: Secondary | ICD-10-CM | POA: Diagnosis not present

## 2022-11-22 DIAGNOSIS — S82121D Displaced fracture of lateral condyle of right tibia, subsequent encounter for closed fracture with routine healing: Secondary | ICD-10-CM | POA: Diagnosis not present

## 2022-11-22 DIAGNOSIS — R339 Retention of urine, unspecified: Secondary | ICD-10-CM | POA: Diagnosis not present

## 2022-11-22 DIAGNOSIS — M62838 Other muscle spasm: Secondary | ICD-10-CM | POA: Diagnosis not present

## 2022-11-22 DIAGNOSIS — I4891 Unspecified atrial fibrillation: Secondary | ICD-10-CM | POA: Diagnosis not present

## 2022-11-22 DIAGNOSIS — M978XXA Periprosthetic fracture around other internal prosthetic joint, initial encounter: Secondary | ICD-10-CM | POA: Diagnosis not present

## 2022-11-22 DIAGNOSIS — M25511 Pain in right shoulder: Secondary | ICD-10-CM | POA: Diagnosis not present

## 2022-11-22 DIAGNOSIS — M546 Pain in thoracic spine: Secondary | ICD-10-CM | POA: Diagnosis not present

## 2022-11-22 DIAGNOSIS — K5900 Constipation, unspecified: Secondary | ICD-10-CM | POA: Diagnosis not present

## 2022-11-22 DIAGNOSIS — J302 Other seasonal allergic rhinitis: Secondary | ICD-10-CM | POA: Diagnosis not present

## 2022-11-22 DIAGNOSIS — Z79899 Other long term (current) drug therapy: Secondary | ICD-10-CM | POA: Diagnosis not present

## 2022-11-22 DIAGNOSIS — S92151D Displaced avulsion fracture (chip fracture) of right talus, subsequent encounter for fracture with routine healing: Secondary | ICD-10-CM | POA: Diagnosis not present

## 2022-11-22 DIAGNOSIS — S82141D Displaced bicondylar fracture of right tibia, subsequent encounter for closed fracture with routine healing: Secondary | ICD-10-CM | POA: Diagnosis not present

## 2022-11-22 DIAGNOSIS — M6282 Rhabdomyolysis: Secondary | ICD-10-CM | POA: Diagnosis not present

## 2022-11-22 DIAGNOSIS — R279 Unspecified lack of coordination: Secondary | ICD-10-CM | POA: Diagnosis not present

## 2022-11-22 DIAGNOSIS — M549 Dorsalgia, unspecified: Secondary | ICD-10-CM | POA: Diagnosis not present

## 2022-11-22 DIAGNOSIS — R338 Other retention of urine: Secondary | ICD-10-CM | POA: Diagnosis not present

## 2022-11-22 DIAGNOSIS — M6281 Muscle weakness (generalized): Secondary | ICD-10-CM | POA: Diagnosis not present

## 2022-11-22 LAB — BASIC METABOLIC PANEL
Anion gap: 4 — ABNORMAL LOW (ref 5–15)
BUN: 14 mg/dL (ref 6–20)
CO2: 25 mmol/L (ref 22–32)
Calcium: 7.9 mg/dL — ABNORMAL LOW (ref 8.9–10.3)
Chloride: 107 mmol/L (ref 98–111)
Creatinine, Ser: 0.73 mg/dL (ref 0.61–1.24)
GFR, Estimated: >60 mL/min (ref >60)
Glucose, Bld: 116 mg/dL — ABNORMAL HIGH (ref 70–99)
Potassium: 3 mmol/L — ABNORMAL LOW (ref 3.5–5.1)
Sodium: 136 mmol/L (ref 135–145)

## 2022-11-22 LAB — CBC WITH DIFFERENTIAL/PLATELET
Abs Immature Granulocytes: 0.05 10*3/uL (ref 0.00–0.07)
Basophils Absolute: 0 10*3/uL (ref 0.0–0.1)
Basophils Relative: 0 %
Eosinophils Absolute: 0.2 10*3/uL (ref 0.0–0.5)
Eosinophils Relative: 2 %
HCT: 23.8 % — ABNORMAL LOW (ref 39.0–52.0)
Hemoglobin: 8 g/dL — ABNORMAL LOW (ref 13.0–17.0)
Immature Granulocytes: 1 %
Lymphocytes Relative: 18 %
Lymphs Abs: 1.6 10*3/uL (ref 0.7–4.0)
MCH: 30.4 pg (ref 26.0–34.0)
MCHC: 33.6 g/dL (ref 30.0–36.0)
MCV: 90.5 fL (ref 80.0–100.0)
Monocytes Absolute: 0.8 10*3/uL (ref 0.1–1.0)
Monocytes Relative: 9 %
Neutro Abs: 6.2 10*3/uL (ref 1.7–7.7)
Neutrophils Relative %: 70 %
Platelets: 213 10*3/uL (ref 150–400)
RBC: 2.63 MIL/uL — ABNORMAL LOW (ref 4.22–5.81)
RDW: 14.8 % (ref 11.5–15.5)
WBC: 8.9 10*3/uL (ref 4.0–10.5)
nRBC: 0 % (ref 0.0–0.2)

## 2022-11-22 LAB — CK: Total CK: 710 U/L — ABNORMAL HIGH (ref 49–397)

## 2022-11-22 MED ORDER — ACETAMINOPHEN 500 MG PO TABS: 1000.0000 mg | ORAL_TABLET | Freq: Three times a day (TID) | ORAL | 0 refills | Status: DC

## 2022-11-22 MED ORDER — ALPRAZOLAM 1 MG PO TABS: 1.0000 mg | ORAL_TABLET | Freq: Every day | ORAL | 0 refills | Status: AC | PRN

## 2022-11-22 MED ORDER — OXYCODONE HCL 5 MG PO TABS: 5.0000 mg | ORAL_TABLET | Freq: Four times a day (QID) | ORAL | 0 refills | Status: AC | PRN

## 2022-11-22 MED ORDER — ENSURE ENLIVE PO LIQD: 237.0000 mL | ORAL | 12 refills | Status: AC

## 2022-11-22 MED ORDER — ACETAMINOPHEN 500 MG PO TABS: 1000.0000 mg | ORAL_TABLET | Freq: Three times a day (TID) | ORAL | Status: AC

## 2022-11-22 MED ORDER — POLYETHYLENE GLYCOL 3350 17 G PO PACK: 17.0000 g | PACK | Freq: Every day | ORAL | 0 refills | Status: AC | PRN

## 2022-11-22 MED ORDER — ENOXAPARIN SODIUM 40 MG/0.4ML IJ SOSY: 40.0000 mg | PREFILLED_SYRINGE | INTRAMUSCULAR | 0 refills | Status: AC

## 2022-11-22 MED ORDER — TAMSULOSIN HCL 0.4 MG PO CAPS: 0.4000 mg | ORAL_CAPSULE | Freq: Every day | ORAL | Status: AC

## 2022-11-22 MED ORDER — PANTOPRAZOLE SODIUM 40 MG PO TBEC: 40.0000 mg | DELAYED_RELEASE_TABLET | Freq: Every day | ORAL | Status: DC

## 2022-11-22 MED ORDER — DOCUSATE SODIUM 100 MG PO CAPS: 100.0000 mg | ORAL_CAPSULE | Freq: Two times a day (BID) | ORAL | 0 refills | Status: AC

## 2022-11-22 MED ORDER — DILTIAZEM HCL ER COATED BEADS 120 MG PO CP24
120.0000 mg | ORAL_CAPSULE | Freq: Every day | ORAL | Status: DC
  Administered 2022-11-22: 120 mg via ORAL
  Filled 2022-11-22: qty 1

## 2022-11-22 MED ORDER — POTASSIUM CHLORIDE CRYS ER 20 MEQ PO TBCR
40.0000 meq | EXTENDED_RELEASE_TABLET | Freq: Once | ORAL | Status: AC
  Administered 2022-11-22: 40 meq via ORAL
  Filled 2022-11-22: qty 2

## 2022-11-22 MED ORDER — METHOCARBAMOL 500 MG PO TABS: 500.0000 mg | ORAL_TABLET | Freq: Four times a day (QID) | ORAL | Status: AC | PRN

## 2022-11-22 MED ORDER — DILTIAZEM HCL ER COATED BEADS 120 MG PO CP24: 120.0000 mg | ORAL_CAPSULE | Freq: Every day | ORAL | Status: AC

## 2022-11-22 MED ORDER — PHENOBARBITAL 64.8 MG PO TABS: 194.4000 mg | ORAL_TABLET | Freq: Every day | ORAL | 0 refills | Status: DC

## 2022-11-22 NOTE — TOC Transition Note (Signed)
Transition of Care [facility]) - CM/SW Discharge Note   Patient Details  Name: Richard Glass MRN: [id number] Date of Birth: 29-Apr-1971  Transition of Care [facility]) CM/SW Contact:  Milas Gain, [place] Phone Number: 11/22/2022, 11:06 AM   Clinical Narrative:     Patient will DC to: [facility]  Anticipated DC date: 11/22/2022  Family notified: Gerald Stabs   Transport by: Corey Harold  ?  Per MD patient ready for DC to [facility] . RN, patient, patient's family, and facility notified of DC. Discharge Summary sent to facility. RN given number for report tele# [phone] RM# [id number]. DC packet on chart. Ambulance transport requested for patient.  CSW signing off.    Final next level of care: Skilled Nursing Facility Barriers to Discharge: No Barriers Identified   Patient Goals and CMS Choice CMS Medicare.gov Compare Post Acute Care list provided to:: Patient Choice offered to / list presented to : Patient  Discharge Placement                Patient chooses bed at:  [facility]) Patient to be transferred to facility by: [place] Name of family member notified: Gerald Stabs Patient and family notified of of transfer: 11/22/22  Discharge Plan and Services Additional resources added to the After Visit Summary for   In-house Referral: Clinical Social Work   Post Acute Care Choice: [place]                               Social Determinants of Health ([place]) Interventions SDOH Screenings   Food Insecurity: No Food Insecurity (11/17/2022)  Housing: Low Risk  (11/17/2022)  Transportation Needs: No Transportation Needs (11/17/2022)  Utilities: Not At Risk (11/17/2022)  Depression (PHQ2-9): Low Risk  (11/11/2019)  Tobacco Use: High Risk (11/19/2022)     Readmission Risk Interventions     No data to display

## 2022-11-22 NOTE — Progress Notes (Signed)
Mobility Specialist Progress Note:   11/22/22 0930  Mobility  Activity Transferred from bed to chair  Level of Assistance Minimal assist, patient does 75% or more  Assistive Device Front wheel walker  Distance Ambulated (ft) 3 ft  RLE Weight Bearing TWB  LLE Weight Bearing WBAT  Activity Response Tolerated well  Mobility Referral Yes  $Mobility charge 1 Mobility   Pt agreeable to mobility session. Required MinA to stand from EOB, minG to pivot to chair. Pt left in chair with all needs met, alarm on.   Nelta Numbers Mobility Specialist Please contact via SecureChat or  Rehab office at [phone]
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
## 2022-11-22 NOTE — TOC Progression Note (Signed)
Transition of Care [facility]) - Progression Note    Patient Details  Name: Richard Glass MRN: [id number] Date of Birth: 03-13-1971  Transition of Care [facility]) CM/SW [place], [place] Phone Number: 11/22/2022, 10:39 AM  Clinical Narrative:     Patient has SNF bed at [facility]. Insurance authorization approved. CSW spoke with Whitney with [facility] who confirmed patient can dc over today if medically ready. CSW informed MD. CSW will continue to follow and assist with patients dc planning needs.  Expected Discharge Plan: Skilled Nursing Facility Barriers to Discharge: SNF Pending bed offer  Expected Discharge Plan and Services In-house Referral: Clinical Social Work   Post Acute Care Choice: [place] Living arrangements for the past 2 months: Apartment Expected Discharge Date: 11/22/22                                     Social Determinants of Health (SDOH) Interventions SDOH Screenings   Food Insecurity: No Food Insecurity (11/17/2022)  Housing: Low Risk  (11/17/2022)  Transportation Needs: No Transportation Needs (11/17/2022)  Utilities: Not At Risk (11/17/2022)  Depression (PHQ2-9): Low Risk  (11/11/2019)  Tobacco Use: High Risk (11/19/2022)    Readmission Risk Interventions     No data to display

## 2022-11-22 NOTE — Discharge Summary (Signed)
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 15:01   DG Pelvis Portable  Result Date: 11/17/2022 CLINICAL DATA:  Fall from tree.  Pain after trauma EXAM: RIGHT FEMUR PORTABLE 1 VIEW; PORTABLE PELVIS 1-2 VIEWS COMPARISON:  Pelvis x-ray 08/30/2020 FINDINGS: Limited pelvis x-ray did rotation overlapping artifacts. There is a right hip arthroplasty. Fixation hardware as well along the left sacroiliac joint with screws. Of the pelvis there is no obvious fracture or dislocation. Mild degenerative changes of the right sacroiliac joint. Preserved left hip joint. However there is a fracture which is angulated centered at the tip of the femoral component of the Press-Fit femoral stem. Apex lateral angulation of the oblique fracture. Screw fixated acetabular cup. No additional fracture or dislocation of the femur on these  limited views. There are no true AP views obtained throughout the midshaft of the femur or distal. IMPRESSION: Limited x-rays. Oblique mildly displaced and angulated fracture of the proximal femoral shaft centered of the  tip of the femoral component of the right hip arthroplasty. Fixation screws along the left sacroiliac joint. Electronically Signed   By: Jill Side M.D.   On: 11/17/2022 14:32   DG FEMUR PORT, 1V RIGHT  Result Date: 11/17/2022 CLINICAL DATA:  Fall from tree.  Pain after trauma EXAM: RIGHT FEMUR PORTABLE 1 VIEW; PORTABLE PELVIS 1-2 VIEWS COMPARISON:  Pelvis x-ray 08/30/2020 FINDINGS: Limited pelvis x-ray did rotation overlapping artifacts. There is a right hip arthroplasty. Fixation hardware as well along the left sacroiliac joint with screws. Of the pelvis there is no obvious fracture or dislocation. Mild degenerative changes of the right sacroiliac joint. Preserved left hip joint. However there is a fracture which is angulated centered at the tip of the femoral component of the Press-Fit femoral stem. Apex lateral angulation of the oblique fracture. Screw fixated acetabular cup. No additional fracture or dislocation of the femur on these limited views. There are no true AP views obtained throughout the midshaft of the femur or distal. IMPRESSION: Limited x-rays. Oblique mildly displaced and angulated fracture of the proximal femoral shaft centered of the tip of the femoral component of the right hip arthroplasty. Fixation screws along the left sacroiliac joint. Electronically Signed   By: Jill Side M.D.   On: 11/17/2022 14:32   DG Chest Port 1 View  Result Date: 11/17/2022 CLINICAL DATA:  Pain after trauma.  Fall from tree EXAM: PORTABLE CHEST 1 VIEW COMPARISON:  X-ray 05/01/2021 FINDINGS: The left costophrenic angle is clipped off the edge of the film. No pneumothorax, effusion or edema. Enlarged cardiopericardial silhouette. This could be technical with underinflation and portable  technique. Film is also rotated. IMPRESSION: Limited x-ray.  No acute cardiopulmonary disease. Electronically Signed   By: Jill Side M.D.   On: 11/17/2022 14:29     Labs:   Basic Metabolic Panel: Recent Labs  Lab 11/18/22 0358 11/18/22 1552 11/19/22 0335 11/20/22 0354 11/20/22 2356 11/22/22 0218  NA 137 133* 135 135 135 136  K 3.7 4.6 4.5 3.5 3.2* 3.0*  CL 106  --  103 103 102 107  CO2 23  --  '25 29 24 25  '$ GLUCOSE 119*  --  149* 128* 128* 116*  BUN 12  --  '14 12 11 14  '$ CREATININE 1.04  --  1.20 0.95 0.76 0.73  CALCIUM 8.4*  --  7.6* 7.4* 7.7* 7.9*  MG  --   --   --   --  2.0  --    GFR Estimated Creatinine Clearance: 103.3 mL/min (by C-G formula based on SCr of 0.73 mg/dL). Liver Function Tests: Recent Labs  Lab 11/17/22 1406  AST 28  ALT 17  ALKPHOS 80  BILITOT 0.7  PROT 6.4*  ALBUMIN 4.1   No results for input(s): "LIPASE", "AMYLASE" in the last 168 hours. No results for input(s): "AMMONIA" in the last 168 hours. Coagulation profile Recent Labs  Lab 11/17/22 1859  INR 1.2    CBC: Recent Labs  Lab 11/18/22 0358 11/18/22 1552 11/19/22 0335 11/20/22 0354 11/20/22 2356 11/21/22 0237 11/22/22 0218  WBC 8.6  --  12.9* 9.7  --  10.4 8.9  NEUTROABS  --   --   --   --   --   --  6.2  HGB 11.1*   < > 7.3* 6.8* 8.2* 7.9* 8.0*  HCT 32.9*   < > 22.8* 19.7* 24.4* 23.6* 23.8*  MCV 92.2  --  94.6 89.5  --  89.7 90.5  PLT 223  --  186 143*  --  175 213   < > = values in this interval not displayed.   Cardiac Enzymes: Recent Labs  Lab 11/18/22 0358 11/20/22 0354 11/22/22 0218  CKTOTAL 407* 1,082* 710*   BNP: Invalid input(s): "POCBNP" CBG: No results for input(s): "GLUCAP" in the last 168 hours. D-Dimer No results for input(s): "DDIMER" in the last 72 hours. Hgb A1c No results for input(s): "HGBA1C" in the last 72 hours. Lipid Profile No results for input(s): "CHOL", "HDL", "LDLCALC", "TRIG", "CHOLHDL", "LDLDIRECT" in the last 72 hours. Thyroid  function studies Recent Labs    11/21/22 0008  TSH 4.614*   Anemia work up No results for input(s): "VITAMINB12", "FOLATE", "FERRITIN", "TIBC", "IRON", "RETICCTPCT" in the last 72 hours. Microbiology Recent Results (from the past 240 hour(s))  MRSA Next Gen by PCR, Nasal     Status: None   Collection Time: 11/17/22  9:21 PM   Specimen: Nasal Mucosa; Nasal Swab  Result Value Ref Range Status   MRSA by PCR Next Gen NOT DETECTED NOT DETECTED Final    Comment: (NOTE) The GeneXpert MRSA Assay (FDA approved for NASAL specimens only), is one component of a comprehensive MRSA colonization surveillance program. It is not intended to diagnose MRSA infection nor to guide or monitor treatment for MRSA infections. Test performance is not FDA approved in patients less than 82 years old. Performed at [facility], [place] [address] [place], [place] [address]     Time coordinating discharge: 35 minutes  Signed: Mohammed Mcandrew  [facility] 11/22/2022, 1:04 PM

## 2022-11-25 ENCOUNTER — Telehealth: Payer: Self-pay | Admitting: Orthopaedic Surgery

## 2022-11-25 NOTE — Telephone Encounter (Signed)
[facility] called for post op pt surgery date 3/4. Lovena Le opened appt for 3/19 @ 2 pm

## 2022-11-26 DIAGNOSIS — M9701XD Periprosthetic fracture around internal prosthetic right hip joint, subsequent encounter: Secondary | ICD-10-CM | POA: Diagnosis not present

## 2022-11-26 DIAGNOSIS — K5900 Constipation, unspecified: Secondary | ICD-10-CM | POA: Diagnosis not present

## 2022-11-26 DIAGNOSIS — Z79899 Other long term (current) drug therapy: Secondary | ICD-10-CM | POA: Diagnosis not present

## 2022-11-26 DIAGNOSIS — I4891 Unspecified atrial fibrillation: Secondary | ICD-10-CM | POA: Diagnosis not present

## 2022-11-26 DIAGNOSIS — Z7189 Other specified counseling: Secondary | ICD-10-CM | POA: Diagnosis not present

## 2022-11-26 DIAGNOSIS — M549 Dorsalgia, unspecified: Secondary | ICD-10-CM | POA: Diagnosis not present

## 2022-11-26 DIAGNOSIS — J302 Other seasonal allergic rhinitis: Secondary | ICD-10-CM | POA: Diagnosis not present

## 2022-11-28 DIAGNOSIS — M62830 Muscle spasm of back: Secondary | ICD-10-CM | POA: Diagnosis not present

## 2022-11-28 DIAGNOSIS — Z79899 Other long term (current) drug therapy: Secondary | ICD-10-CM | POA: Diagnosis not present

## 2022-11-28 DIAGNOSIS — G8929 Other chronic pain: Secondary | ICD-10-CM | POA: Diagnosis not present

## 2022-11-28 DIAGNOSIS — G43909 Migraine, unspecified, not intractable, without status migrainosus: Secondary | ICD-10-CM | POA: Diagnosis not present

## 2022-11-28 DIAGNOSIS — M542 Cervicalgia: Secondary | ICD-10-CM | POA: Diagnosis not present

## 2022-11-28 DIAGNOSIS — I4891 Unspecified atrial fibrillation: Secondary | ICD-10-CM | POA: Diagnosis not present

## 2022-11-28 DIAGNOSIS — D649 Anemia, unspecified: Secondary | ICD-10-CM | POA: Diagnosis not present

## 2022-11-28 DIAGNOSIS — R339 Retention of urine, unspecified: Secondary | ICD-10-CM | POA: Diagnosis not present

## 2022-11-28 DIAGNOSIS — M546 Pain in thoracic spine: Secondary | ICD-10-CM | POA: Diagnosis not present

## 2022-11-28 DIAGNOSIS — M25511 Pain in right shoulder: Secondary | ICD-10-CM | POA: Diagnosis not present

## 2022-11-28 DIAGNOSIS — M25519 Pain in unspecified shoulder: Secondary | ICD-10-CM | POA: Diagnosis not present

## 2022-11-28 DIAGNOSIS — M9701XD Periprosthetic fracture around internal prosthetic right hip joint, subsequent encounter: Secondary | ICD-10-CM | POA: Diagnosis not present

## 2022-12-03 ENCOUNTER — Other Ambulatory Visit (INDEPENDENT_AMBULATORY_CARE_PROVIDER_SITE_OTHER): Payer: 59

## 2022-12-03 ENCOUNTER — Ambulatory Visit (INDEPENDENT_AMBULATORY_CARE_PROVIDER_SITE_OTHER): Payer: 59 | Admitting: Orthopaedic Surgery

## 2022-12-03 DIAGNOSIS — Z96649 Presence of unspecified artificial hip joint: Secondary | ICD-10-CM | POA: Diagnosis not present

## 2022-12-03 DIAGNOSIS — M978XXA Periprosthetic fracture around other internal prosthetic joint, initial encounter: Secondary | ICD-10-CM

## 2022-12-03 NOTE — Progress Notes (Signed)
Post-Op Visit Note   Patient: Richard Glass           Date of Birth: July 18, 1971           MRN: [id number] Visit Date: 12/03/2022 PCP: Riki Sheer, NP   Assessment & Plan:  Chief Complaint:  Chief Complaint  Patient presents with   Right Leg - Routine Post Op   Visit Diagnoses:  1. Periprosthetic fracture of femur following total replacement of hip, initial encounter     Plan: Patient is a pleasant 52 year old gentleman who comes in today 2 weeks status ORIF right periprosthetic femur fracture, date of surgery 11/18/2022.  He has been compliant touchdown weightbearing.  He has been staying at a [facility] where he is getting infrequent physical therapy.  Examination of the right hip reveals a well-healed surgical wound with staples intact.  No evidence of infection or cellulitis.  Calves are soft and nontender.  He is neurovascular intact distally.  At this point, we will have the patient remain touchdown weightbearing to the right lower extremity for another 4 weeks.  He will follow-up with [place] in 4 weeks for repeat evaluation and x-rays of the right femur.  Call with concerns or questions in the meantime.  Follow-Up Instructions: Return in about 4 weeks (around 12/31/2022).   Orders:  Orders Placed This Encounter  Procedures   XR FEMUR, MIN 2 VIEWS RIGHT   No orders of the defined types were placed in this encounter.   Imaging: XR FEMUR, MIN 2 VIEWS RIGHT  Result Date: 12/03/2022 X-rays demonstrate stable fracture without hardware complication   PMFS History: Patient Active Problem List   Diagnosis Date Noted   Malnutrition of moderate degree 11/21/2022   Closed fracture of lateral portion of right tibial plateau 11/18/2022   Periprosthetic fracture of femur at tip of prosthesis 11/17/2022   Lactic acidosis 11/17/2022   Chronic migraine w/o aura w/o status migrainosus, not intractable 03/14/2022   Wedge compression fracture of third thoracic vertebra, initial  encounter for closed fracture ([place]) 09/14/2020   Status post right hip replacement 02/15/2020   Status post total replacement of right hip 11/22/2019   Painful orthopaedic hardware ([place]) 11/11/2019   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   Weight loss, non-intentional 10/14/2019   Bilateral leg weakness 09/03/2019   S/P right hip fracture 07/07/2019   Right leg pain 06/26/2019   Femoral fracture ([place]) 06/26/2019   Hearing loss 05/01/2019   Chronic left leg pain 11/20/2018   Allodynia, left leg 11/20/2018   History of traumatic brain injury 02/23/2018   High risk medication use, Combination of Opioid and Benzodiazapine 02/12/2018   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   Encephalomalacia on imaging study 05/01/2017   SI (sacroiliac) pain 03/20/2017   Enchondroma of left fibular head 05/17/2016   Chronic pain syndrome 01/18/2016   Anxiety state 08/08/2013   Cerumen impaction 08/06/2013   Insomnia 02/02/2013   Right flank pain 05/21/2011   Tremor 10/31/2009   Encounter for chronic pain management 07/04/2009   Restless leg 09/29/2007   Hyperlipidemia 11/13/2006   Allergic rhinitis 11/13/2006   GASTROESOPHAGEAL REFLUX, NO ESOPHAGITIS 11/13/2006   Low back pain 11/13/2006   Convulsions ([place]) 11/13/2006   Seizure disorder ([place]) 11/13/2006   Past Medical History:  Diagnosis Date   Abnormal head CT 05/2011   Cystic encephalomalacia left temptemporal and parietal regions from remote injury.   Allergic rhinitis 11/13/2006  Anxiety    Anxiety state 08/08/2013   Asthma    ASTHMA, INTERMITTENT 07/08/2010   Qualifier: Diagnosis of  By: Walker Kehr MD, [place]     Back pain of lumbar region with sciatica 11/13/2006   MRI 12/2014 - lumbar DDD without focal neural impingement  MRI Lumbar 10/30/16 ([place]) Small central L5-S1 disc extrusion with minimal cranial migration and associated annular fissure approaches descending left S1 nerve roots without  contact or displacement. Minimal bulging disc height loss without spinal canal stenosis or neural foraminal narrowing.  Minimal bulging disc at L2-L3 through L4-L5 without spinal canal stenosis or neural foraminal narrowing.  MRI Sacrum 10/30/16 ([place]) No fracture. Small lesion left sacral ala most likely a subchondral cyst/erosion adjacent to the sacroiliac joint.      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 The craniocervical junction is normal and cervical cord signal is normal. The central spinal canal and neural foramina are widely patent at all levels. Scattered, mild degenerative change appears most notable at C4-5. Imaged paraspinous structures are unremarkable.   IMPRESSION: Negative for central canal or foraminal narrowing. No finding to explain the patient's symptoms. Scattered, mild facet degenerative disease is noted.    Cervicogenic migraine    Chronic low back pain    Chronic pain 01/18/2016   Chronic pain syndrome 01/18/2016   Coccyx pain 02/06/2015   Contact dermatitis or eczema 02/13/2018   Convulsions ([place]) 11/13/2006     Cystic encephalomalacia left temporal and parietal regions from remote injury.    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  075-GRM   Morton's neuroma of left foot 01/18/2016   Osteopenia 10/10/2016   Overweight(278.02) 06/13/2009   Qualifier: Diagnosis of  By: Hedy Camara     Pain in joint, shoulder region 07/05/2014   LEFT > Right Reports hx rotator cuff surgery on rigt shoulder previously (Dr Nicholes Stairs) but no notes available XRAY right shoulder showed some proliferative changes distal rigt clavicle    Rash and nonspecific skin eruption 10/10/2016   Restless leg 09/29/2007   Qualifier: Diagnosis of  By: Walker Kehr MD, [place]     Restless legs syndrome (RLS)    Sciatica of left side 10/26/2014   Seizures (H[facility])    last >76yrs   SI (sacroiliac) pain 03/20/2017   Sinusitis chronic, frontal    Status post lumbar spinal fusion 03/20/2017   Subacromial or subdeltoid bursitis 08/08/2009   MRI C-spine 2011([facility]) - normal MRI left shoulder 10/2010 ([facility]) - AC degenerative disease, normal rotator cuff 12/2012 -debridement, acromioplasty and distal clavicle excision of left shoulder, arthroscopy also performed an no need for rotator cuff repair - performed by [facility] 02/2013 - Nerve Conduction Studies ([facility]) - ulnar nerve compression  02/2013- taken back to OR for Ulnar nerve decompression 06/2013 Repeat MRI left shoulder - subacromial/subdeltoid bursitis, a small intersubstance tear to the distal infraspinatus and evidence of interval resection of the distal clavicle 06/2013 - steroid injection of left biceps 09/2013 -Repeat EMG showed improvement of ulnar nerve compression 10/2013 - referred to [facility] for second opinion due to intractable pain 11/2013 - Seen by Dr. 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) found 2 small tubular adenomas (3 mm & 4 mm) - recommend  follow up surveillance colonoscopy in 7 years.    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   Presumed surgery June 2014     Family History  Problem Relation Age of Onset   Hypertension Mother    Cancer Father        lung   Heart disease Father    Seizures Neg Hx    Colon cancer Neg Hx    Colon polyps Neg Hx    Esophageal cancer Neg Hx    Stomach cancer Neg Hx    Rectal cancer Neg Hx     Past Surgical History:  Procedure Laterality Date   [place]   struck by golf ball-52 yrs old   CRANIOTOMY  1973   metal plate   HARDWARE REMOVAL Right 11/22/2019   Procedure: HARDWARE REMOVAL;  Surgeon: Leandrew Koyanagi, MD;  Location: [place];  Service: Orthopedics;  Laterality: Right;   HIP PINNING,CANNULATED Right 06/27/2019   Procedure: HIP PERCUTANEOUS PINNING;  Surgeon: Meredith Pel, MD;  Location: [place];  Service: Orthopedics;  Laterality: Right;   NASAL FRACTURE SURGERY  1997   ORIF PERIPROSTHETIC FRACTURE Right 11/18/2022   Procedure: OPEN REDUCTION INTERNAL FIXATION (ORIF) PERIPROSTHETIC FRACTURE;  Surgeon: Leandrew Koyanagi, MD;  Location: [place];  Service: Orthopedics;  Laterality: Right;   SACROILIAC JOINT FUSION Left 03/20/2017   Procedure: SACROILIAC JOINT FUSION;  Surgeon: Melina Schools, MD;  Location: [place];  Service: Orthopedics;  Laterality: Left;  90 mins   SHOULDER ARTHROSCOPY Left    "cleaned arthritis out"   SHOULDER SURGERY Right 2012   Rotator cuff surgery   TONSILLECTOMY     TOTAL HIP ARTHROPLASTY Right 11/22/2019   Procedure: CONVERSION OF PREVIOUS RIGHT HIP SURGERY TO TOTAL HIP ARTHROPLASTY ANTERIOR APPROACH, HARDWARE REMOVAL;  Surgeon: Leandrew Koyanagi, MD;  Location: [place];  Service: Orthopedics;  Laterality: Right;   UVULOPALATOPHARYNGOPLASTY (UPPP)/TONSILLECTOMY/SEPTOPLASTY  2001   Social History   Occupational History   Occupation: disabled  Tobacco Use   Smoking status: Former    Packs/day: 1.00    Years: 22.00    Additional pack  years: 0.00    Total pack years: 22.00    Types: Cigarettes    Quit date: 06/22/2012    Years since quitting: 10.4   Smokeless tobacco: Current    Types: Snuff   Tobacco comments:    per patient uses "dip" about twice a day   Vaping Use   Vaping Use: Never used  Substance and Sexual Activity   Alcohol use: No    Alcohol/week: 0.0 standard drinks of alcohol    Comment: none since 7/11   Drug use: No   Sexual activity: Never

## 2022-12-04 DIAGNOSIS — R059 Cough, unspecified: Secondary | ICD-10-CM | POA: Diagnosis not present

## 2022-12-05 DIAGNOSIS — R079 Chest pain, unspecified: Secondary | ICD-10-CM | POA: Diagnosis not present

## 2022-12-05 DIAGNOSIS — M9701XD Periprosthetic fracture around internal prosthetic right hip joint, subsequent encounter: Secondary | ICD-10-CM | POA: Diagnosis not present

## 2022-12-05 DIAGNOSIS — R339 Retention of urine, unspecified: Secondary | ICD-10-CM | POA: Diagnosis not present

## 2022-12-05 DIAGNOSIS — G8929 Other chronic pain: Secondary | ICD-10-CM | POA: Diagnosis not present

## 2022-12-06 DIAGNOSIS — R338 Other retention of urine: Secondary | ICD-10-CM | POA: Diagnosis not present

## 2022-12-10 DIAGNOSIS — J302 Other seasonal allergic rhinitis: Secondary | ICD-10-CM | POA: Diagnosis not present

## 2022-12-10 DIAGNOSIS — G43909 Migraine, unspecified, not intractable, without status migrainosus: Secondary | ICD-10-CM | POA: Diagnosis not present

## 2022-12-10 DIAGNOSIS — I4891 Unspecified atrial fibrillation: Secondary | ICD-10-CM | POA: Diagnosis not present

## 2022-12-10 DIAGNOSIS — L299 Pruritus, unspecified: Secondary | ICD-10-CM | POA: Diagnosis not present

## 2022-12-10 DIAGNOSIS — R339 Retention of urine, unspecified: Secondary | ICD-10-CM | POA: Diagnosis not present

## 2022-12-10 DIAGNOSIS — G8929 Other chronic pain: Secondary | ICD-10-CM | POA: Diagnosis not present

## 2022-12-12 DIAGNOSIS — S82141D Displaced bicondylar fracture of right tibia, subsequent encounter for closed fracture with routine healing: Secondary | ICD-10-CM | POA: Diagnosis not present

## 2022-12-12 DIAGNOSIS — S92151D Displaced avulsion fracture (chip fracture) of right talus, subsequent encounter for fracture with routine healing: Secondary | ICD-10-CM | POA: Diagnosis not present

## 2022-12-12 DIAGNOSIS — Z7189 Other specified counseling: Secondary | ICD-10-CM | POA: Diagnosis not present

## 2022-12-12 DIAGNOSIS — I4891 Unspecified atrial fibrillation: Secondary | ICD-10-CM | POA: Diagnosis not present

## 2022-12-12 DIAGNOSIS — D649 Anemia, unspecified: Secondary | ICD-10-CM | POA: Diagnosis not present

## 2022-12-12 DIAGNOSIS — M545 Low back pain, unspecified: Secondary | ICD-10-CM | POA: Diagnosis not present

## 2022-12-12 DIAGNOSIS — M62838 Other muscle spasm: Secondary | ICD-10-CM | POA: Diagnosis not present

## 2022-12-12 DIAGNOSIS — G43909 Migraine, unspecified, not intractable, without status migrainosus: Secondary | ICD-10-CM | POA: Diagnosis not present

## 2022-12-12 DIAGNOSIS — R279 Unspecified lack of coordination: Secondary | ICD-10-CM | POA: Diagnosis not present

## 2022-12-12 DIAGNOSIS — M9701XD Periprosthetic fracture around internal prosthetic right hip joint, subsequent encounter: Secondary | ICD-10-CM | POA: Diagnosis not present

## 2022-12-24 ENCOUNTER — Other Ambulatory Visit: Payer: Self-pay

## 2022-12-24 ENCOUNTER — Telehealth: Payer: Self-pay | Admitting: Orthopaedic Surgery

## 2022-12-24 DIAGNOSIS — M978XXA Periprosthetic fracture around other internal prosthetic joint, initial encounter: Secondary | ICD-10-CM

## 2022-12-24 NOTE — Telephone Encounter (Signed)
Patient called. Says he has not received home PT. His call back number  is [phone]

## 2022-12-24 NOTE — Telephone Encounter (Signed)
Can you order hhpt?  Touch down weight bearing and knee rom as tolerated

## 2022-12-24 NOTE — Telephone Encounter (Signed)
Referral sent to [facility]. I notified patient.

## 2022-12-26 ENCOUNTER — Telehealth: Payer: Self-pay | Admitting: Orthopaedic Surgery

## 2022-12-26 DIAGNOSIS — Z79891 Long term (current) use of opiate analgesic: Secondary | ICD-10-CM | POA: Diagnosis not present

## 2022-12-26 DIAGNOSIS — Z96641 Presence of right artificial hip joint: Secondary | ICD-10-CM | POA: Diagnosis not present

## 2022-12-26 DIAGNOSIS — I4891 Unspecified atrial fibrillation: Secondary | ICD-10-CM | POA: Diagnosis not present

## 2022-12-26 DIAGNOSIS — Z87820 Personal history of traumatic brain injury: Secondary | ICD-10-CM | POA: Diagnosis not present

## 2022-12-26 DIAGNOSIS — G8929 Other chronic pain: Secondary | ICD-10-CM | POA: Diagnosis not present

## 2022-12-26 DIAGNOSIS — M978XXD Periprosthetic fracture around other internal prosthetic joint, subsequent encounter: Secondary | ICD-10-CM | POA: Diagnosis not present

## 2022-12-26 NOTE — Telephone Encounter (Signed)
Yes, thank you.

## 2022-12-26 NOTE — Telephone Encounter (Signed)
Needs verbal order for home health P/t ..2w3,1w1 ([facility]) [phone]

## 2022-12-26 NOTE — Telephone Encounter (Signed)
Called and gave verbal ok

## 2022-12-29 DIAGNOSIS — M546 Pain in thoracic spine: Secondary | ICD-10-CM | POA: Diagnosis not present

## 2022-12-29 DIAGNOSIS — M62830 Muscle spasm of back: Secondary | ICD-10-CM | POA: Diagnosis not present

## 2022-12-29 DIAGNOSIS — M25511 Pain in right shoulder: Secondary | ICD-10-CM | POA: Diagnosis not present

## 2022-12-29 DIAGNOSIS — M542 Cervicalgia: Secondary | ICD-10-CM | POA: Diagnosis not present

## 2022-12-29 DIAGNOSIS — M25519 Pain in unspecified shoulder: Secondary | ICD-10-CM | POA: Diagnosis not present

## 2022-12-31 ENCOUNTER — Other Ambulatory Visit (INDEPENDENT_AMBULATORY_CARE_PROVIDER_SITE_OTHER): Payer: 59

## 2022-12-31 ENCOUNTER — Ambulatory Visit (INDEPENDENT_AMBULATORY_CARE_PROVIDER_SITE_OTHER): Payer: 59 | Admitting: Physician Assistant

## 2022-12-31 ENCOUNTER — Encounter: Payer: Self-pay | Admitting: Orthopaedic Surgery

## 2022-12-31 DIAGNOSIS — Z79891 Long term (current) use of opiate analgesic: Secondary | ICD-10-CM | POA: Diagnosis not present

## 2022-12-31 DIAGNOSIS — Z96649 Presence of unspecified artificial hip joint: Secondary | ICD-10-CM | POA: Diagnosis not present

## 2022-12-31 DIAGNOSIS — Z96641 Presence of right artificial hip joint: Secondary | ICD-10-CM | POA: Diagnosis not present

## 2022-12-31 DIAGNOSIS — M978XXA Periprosthetic fracture around other internal prosthetic joint, initial encounter: Secondary | ICD-10-CM | POA: Diagnosis not present

## 2022-12-31 DIAGNOSIS — I4891 Unspecified atrial fibrillation: Secondary | ICD-10-CM | POA: Diagnosis not present

## 2022-12-31 DIAGNOSIS — G8929 Other chronic pain: Secondary | ICD-10-CM | POA: Diagnosis not present

## 2022-12-31 DIAGNOSIS — M978XXD Periprosthetic fracture around other internal prosthetic joint, subsequent encounter: Secondary | ICD-10-CM | POA: Diagnosis not present

## 2022-12-31 DIAGNOSIS — Z87820 Personal history of traumatic brain injury: Secondary | ICD-10-CM | POA: Diagnosis not present

## 2022-12-31 MED ORDER — HYDROCODONE-ACETAMINOPHEN 5-325 MG PO TABS: 1.0000 | ORAL_TABLET | Freq: Three times a day (TID) | ORAL | 0 refills | Status: DC | PRN

## 2022-12-31 NOTE — Progress Notes (Signed)
Post-Op Visit Note   Patient: Richard Glass           Date of Birth: February 16, 1971           MRN: [id number] Visit Date: 12/31/2022 PCP: Shanna Cisco, NP   Assessment & Plan:  Chief Complaint:  Chief Complaint  Patient presents with   Right Hip - Follow-up    ORIF Right hip 11/18/2022   Visit Diagnoses:  1. Periprosthetic fracture of femur following total replacement of hip, initial encounter     Plan: Patient is a pleasant 52 year old gentleman who comes in today 6 weeks status post ORIF right periprosthetic femur fracture, date of surgery 11/18/2022.  He has been doing relatively well.  He is taking oxycodone for pain.  Home health physical therapy has been out recently for an evaluation and should be coming back soon for therapy.  Patient is currently ambulating with a walker.  Examination of the right hip reveals a well-healed surgical incision without complication.  Calves are soft nontender.  He is neurovascular intact distally.  Today, if sent in a refill of pain medicine but we are weaning to Norco.  He will continue with PT.  Follow-up in 6 weeks for repeat evaluation and x-rays of the right femur.  Call with concerns or questions.  Follow-Up Instructions: Return in about 6 weeks (around 02/11/2023).   Orders:  Orders Placed This Encounter  Procedures   XR FEMUR, MIN 2 VIEWS RIGHT   No orders of the defined types were placed in this encounter.   Imaging: XR FEMUR, MIN 2 VIEWS RIGHT  Result Date: 12/31/2022 X-rays demonstrate a well-healing fracture with bony consolidation.  No hardware complication.  Well-seated prosthesis   PMFS History: Patient Active Problem List   Diagnosis Date Noted   Malnutrition of moderate degree 11/21/2022   Closed fracture of lateral portion of right tibial plateau 11/18/2022   Periprosthetic fracture of femur at tip of prosthesis 11/17/2022   Lactic acidosis 11/17/2022   Chronic migraine w/o aura w/o status migrainosus, not  intractable 03/14/2022   Wedge compression fracture of third thoracic vertebra, initial encounter for closed fracture 09/14/2020   Status post right hip replacement 02/15/2020   Status post total replacement of right hip 11/22/2019   Painful orthopaedic hardware 11/11/2019   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   Weight loss, non-intentional 10/14/2019   Bilateral leg weakness 09/03/2019   S/P right hip fracture 07/07/2019   Right leg pain 06/26/2019   Femoral fracture 06/26/2019   Hearing loss 05/01/2019   Chronic left leg pain 11/20/2018   Allodynia, left leg 11/20/2018   History of traumatic brain injury 02/23/2018   High risk medication use, Combination of Opioid and Benzodiazapine 02/12/2018   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 40/98/1191   Encephalomalacia on imaging study 05/01/2017   SI (sacroiliac) pain 03/20/2017   Enchondroma of left fibular head 05/17/2016   Chronic pain syndrome 01/18/2016   Anxiety state 08/08/2013   Cerumen impaction 08/06/2013   Insomnia 02/02/2013   Right flank pain 05/21/2011   Tremor 10/31/2009   Encounter for chronic pain management 07/04/2009   Restless leg 09/29/2007   Hyperlipidemia 11/13/2006   Allergic rhinitis 11/13/2006   GASTROESOPHAGEAL REFLUX, NO ESOPHAGITIS 11/13/2006   Low back pain 11/13/2006   Convulsions 11/13/2006   Seizure disorder 11/13/2006   Past Medical History:  Diagnosis Date   Abnormal head CT 05/2011   Cystic encephalomalacia left temptemporal and parietal regions from  remote injury.   Allergic rhinitis 11/13/2006        Anxiety    Anxiety state 08/08/2013   Asthma    ASTHMA, INTERMITTENT 07/08/2010   Qualifier: Diagnosis of  By: Sheffield Slider MD, Wayne     Back pain of lumbar region with sciatica 11/13/2006   MRI 12/2014 - lumbar DDD without focal neural impingement  MRI Lumbar 10/30/16 [facility]) Small central L5-S1 disc extrusion with minimal cranial migration and associated  annular fissure approaches descending left S1 nerve roots without contact or displacement. Minimal bulging disc height loss without spinal canal stenosis or neural foraminal narrowing.  Minimal bulging disc at L2-L3 through L4-L5 without spinal canal stenosis or neural foraminal narrowing.  MRI Sacrum 10/30/16 [facility]) No fracture. Small lesion left sacral ala most likely a subchondral cyst/erosion adjacent to the sacroiliac joint.      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 The craniocervical junction is normal and cervical cord signal is normal. The central spinal canal and neural foramina are widely patent at all levels. Scattered, mild degenerative change appears most notable at C4-5. Imaged paraspinous structures are unremarkable.   IMPRESSION: Negative for central canal or foraminal narrowing. No finding to explain the patient's symptoms. Scattered, mild facet degenerative disease is noted.    Cervicogenic migraine    Chronic low back pain    Chronic pain 01/18/2016   Chronic pain syndrome 01/18/2016   Coccyx pain 02/06/2015   Contact dermatitis or eczema 02/13/2018   Convulsions 11/13/2006     Cystic encephalomalacia left temporal and parietal regions from remote injury.    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 16/06/9603   Morton's neuroma of left foot 01/18/2016   Osteopenia 10/10/2016   Overweight(278.02) 06/13/2009   Qualifier: Diagnosis of  By: Terese Door     Pain in joint, shoulder region 07/05/2014   LEFT > Right Reports hx rotator cuff surgery on rigt shoulder previously (Dr Tamala Bari) but no notes available XRAY right shoulder showed some proliferative changes distal rigt clavicle    Rash and nonspecific skin eruption 10/10/2016   Restless leg 09/29/2007   Qualifier: Diagnosis of  By: Sheffield Slider MD, Wayne     Restless legs syndrome (RLS)    Sciatica of left side 10/26/2014   Seizures    last >75yrs   SI (sacroiliac) pain 03/20/2017   Sinusitis chronic, frontal    Status post lumbar spinal fusion 03/20/2017   Subacromial or subdeltoid bursitis 08/08/2009   MRI C-spine 2011([facility]) - normal MRI left shoulder 10/2010 ([facility]) - AC degenerative disease, normal rotator cuff 12/2012 -debridement, acromioplasty and distal clavicle excision of left shoulder, arthroscopy also performed an no need for rotator cuff repair - performed  by [facility] 02/2013 - Nerve Conduction Studies ([facility]) - ulnar nerve compression 02/2013- taken back to OR for Ulnar nerve decompression 06/2013 Repeat MRI left shoulder - subacromial/subdeltoid bursitis, a small intersubstance tear to the distal infraspinatus and evidence of interval resection of the distal clavicle 06/2013 - steroid injection of left biceps 09/2013 -Repeat EMG showed improvement of ulnar nerve compression 10/2013 - referred to [facility] for second opinion due to intractable pain 11/2013 - Seen by Dr. August Saucer at [facility]; discussed risks/benefits of surgical intervention and bursectomy, no plan for surgery at this time      Traumatic cerebral hemorrhage 1975   Tubular adenoma of colon 01/31/2020   01/2020 screening colonoscopy (S. [facility]) found  2 small tubular adenomas (3 mm & 4 mm) - recommend follow up surveillance colonoscopy in 7 years.    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   Presumed surgery June 2014     Family History  Problem Relation Age of Onset   Hypertension Mother    Cancer Father        lung   Heart disease Father    Seizures Neg Hx    Colon cancer Neg Hx    Colon polyps Neg Hx    Esophageal cancer Neg Hx    Stomach cancer Neg Hx    Rectal cancer Neg Hx     Past Surgical History:  Procedure Laterality Date   BRAIN SURGERY  1975   struck by golf ball-52 yrs old   CRANIOTOMY  1973   metal plate   HARDWARE REMOVAL Right 11/22/2019   Procedure: HARDWARE REMOVAL;  Surgeon: Tarry Kos, MD;  Location: [facility] OR;  Service: Orthopedics;  Laterality: Right;   HIP PINNING,CANNULATED Right 06/27/2019   Procedure: HIP PERCUTANEOUS PINNING;  Surgeon: Cammy Copa, MD;  Location: [facility] OR;  Service: Orthopedics;  Laterality: Right;   NASAL FRACTURE SURGERY  1997   ORIF PERIPROSTHETIC FRACTURE Right 11/18/2022   Procedure: OPEN REDUCTION INTERNAL FIXATION (ORIF) PERIPROSTHETIC FRACTURE;  Surgeon: Tarry Kos, MD;  Location: [facility] OR;  Service: Orthopedics;  Laterality: Right;   SACROILIAC JOINT FUSION Left 03/20/2017   Procedure: SACROILIAC JOINT FUSION;  Surgeon: Venita Lick, MD;  Location: [facility] OR;  Service: Orthopedics;  Laterality: Left;  90 mins   SHOULDER ARTHROSCOPY Left    "cleaned arthritis out"   SHOULDER SURGERY Right 2012   Rotator cuff surgery   TONSILLECTOMY     TOTAL HIP ARTHROPLASTY Right 11/22/2019   Procedure: CONVERSION OF PREVIOUS RIGHT HIP SURGERY TO TOTAL HIP ARTHROPLASTY ANTERIOR APPROACH, HARDWARE REMOVAL;  Surgeon: Tarry Kos, MD;  Location: [facility] OR;  Service: Orthopedics;  Laterality: Right;   UVULOPALATOPHARYNGOPLASTY (UPPP)/TONSILLECTOMY/SEPTOPLASTY  2001   Social History   Occupational History   Occupation: disabled  Tobacco Use   Smoking status: Former     Packs/day: 1.00    Years: 22.00    Additional pack years: 0.00    Total pack years: 22.00    Types: Cigarettes    Quit date: 06/22/2012    Years since quitting: 10.5   Smokeless tobacco: Current    Types: Snuff   Tobacco comments:    per patient uses "dip" about twice a day   Vaping Use   Vaping Use: Never used  Substance and Sexual Activity   Alcohol use: No    Alcohol/week: 0.0 standard drinks of alcohol    Comment: none since 7/11   Drug use: No   Sexual activity: Never

## 2023-01-02 ENCOUNTER — Other Ambulatory Visit: Payer: Self-pay | Admitting: Neurology

## 2023-01-02 DIAGNOSIS — Z79891 Long term (current) use of opiate analgesic: Secondary | ICD-10-CM | POA: Diagnosis not present

## 2023-01-02 DIAGNOSIS — Z96641 Presence of right artificial hip joint: Secondary | ICD-10-CM | POA: Diagnosis not present

## 2023-01-02 DIAGNOSIS — I4891 Unspecified atrial fibrillation: Secondary | ICD-10-CM | POA: Diagnosis not present

## 2023-01-02 DIAGNOSIS — Z87820 Personal history of traumatic brain injury: Secondary | ICD-10-CM | POA: Diagnosis not present

## 2023-01-02 DIAGNOSIS — M978XXD Periprosthetic fracture around other internal prosthetic joint, subsequent encounter: Secondary | ICD-10-CM | POA: Diagnosis not present

## 2023-01-02 DIAGNOSIS — G8929 Other chronic pain: Secondary | ICD-10-CM | POA: Diagnosis not present

## 2023-01-02 NOTE — Telephone Encounter (Signed)
Requested Prescriptions   Pending Prescriptions Disp Refills   PHENobarbital (LUMINAL) 64.8 MG tablet [Pharmacy Med Name: PHENOBARBITAL 64.8 MG TABLET] 270 tablet     Sig: TAKE 3 TABLETS (194.4 MG TOTAL) BY MOUTH AT BEDTIME.   Last seen on 10/17/22 by Teresa Coombs, next appt 02/03/23. Routing to work in provider to Biomedical engineer Quantity Provider Pharmacy  PHENOBARBITAL 64.8 MG TABLET 09/23/2022 90 270 each Glean Salvo, NP [facility] [phone] - G...  PHENOBARBITAL 64.8 MG TABLET 06/25/2022 90 270 each Glean Salvo, NP [facility] [phone] - G...  PHENOBARBITAL  64.8 MG TABS 04/23/2022 60 180 tablet Glean Salvo, NP [facility] [phone] - G...  PHENOBARBITAL 64.8 MG TABLET 03/23/2022 30 90 each Glean Salvo, NP [facility] [phone] - G.Marland KitchenMarland Kitchen

## 2023-01-08 DIAGNOSIS — Z87820 Personal history of traumatic brain injury: Secondary | ICD-10-CM | POA: Diagnosis not present

## 2023-01-08 DIAGNOSIS — Z79891 Long term (current) use of opiate analgesic: Secondary | ICD-10-CM | POA: Diagnosis not present

## 2023-01-08 DIAGNOSIS — I4891 Unspecified atrial fibrillation: Secondary | ICD-10-CM | POA: Diagnosis not present

## 2023-01-08 DIAGNOSIS — G8929 Other chronic pain: Secondary | ICD-10-CM | POA: Diagnosis not present

## 2023-01-08 DIAGNOSIS — Z96641 Presence of right artificial hip joint: Secondary | ICD-10-CM | POA: Diagnosis not present

## 2023-01-08 DIAGNOSIS — M978XXD Periprosthetic fracture around other internal prosthetic joint, subsequent encounter: Secondary | ICD-10-CM | POA: Diagnosis not present

## 2023-01-10 DIAGNOSIS — Z96641 Presence of right artificial hip joint: Secondary | ICD-10-CM | POA: Diagnosis not present

## 2023-01-10 DIAGNOSIS — G8929 Other chronic pain: Secondary | ICD-10-CM | POA: Diagnosis not present

## 2023-01-10 DIAGNOSIS — I4891 Unspecified atrial fibrillation: Secondary | ICD-10-CM | POA: Diagnosis not present

## 2023-01-10 DIAGNOSIS — Z79891 Long term (current) use of opiate analgesic: Secondary | ICD-10-CM | POA: Diagnosis not present

## 2023-01-10 DIAGNOSIS — Z87820 Personal history of traumatic brain injury: Secondary | ICD-10-CM | POA: Diagnosis not present

## 2023-01-10 DIAGNOSIS — M978XXD Periprosthetic fracture around other internal prosthetic joint, subsequent encounter: Secondary | ICD-10-CM | POA: Diagnosis not present

## 2023-01-16 ENCOUNTER — Telehealth: Payer: Self-pay | Admitting: Orthopaedic Surgery

## 2023-01-16 NOTE — Telephone Encounter (Signed)
Denise Banker) from [facility] called stating that pt was to be discharged for physical therapy but needs extension. New orders/referral  need to be resubmitted to pt insurance company. For Pt charged insurance from [facility] to [facility]. Please send new referral for physical therapy. Angelique Blonder is discharging pt and resubmit to new insurance under [place]. Denise secure phone number is [phone].

## 2023-01-16 NOTE — Telephone Encounter (Signed)
Messaged Amy with [facility] via email to continue HHPT.

## 2023-02-03 ENCOUNTER — Encounter: Payer: Self-pay | Admitting: Neurology

## 2023-02-03 ENCOUNTER — Ambulatory Visit (INDEPENDENT_AMBULATORY_CARE_PROVIDER_SITE_OTHER): Payer: Medicare HMO | Admitting: Neurology

## 2023-02-03 VITALS — BP 137/79 | HR 69 | Ht 69.0 in | Wt 168.5 lb

## 2023-02-03 DIAGNOSIS — G40909 Epilepsy, unspecified, not intractable, without status epilepticus: Secondary | ICD-10-CM

## 2023-02-03 MED ORDER — PHENOBARBITAL 64.8 MG PO TABS: 194.4000 mg | ORAL_TABLET | Freq: Every day | ORAL | 4 refills | Status: DC
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
## 2023-02-11 ENCOUNTER — Ambulatory Visit (INDEPENDENT_AMBULATORY_CARE_PROVIDER_SITE_OTHER): Payer: Medicare HMO | Admitting: Orthopaedic Surgery

## 2023-02-11 ENCOUNTER — Other Ambulatory Visit (INDEPENDENT_AMBULATORY_CARE_PROVIDER_SITE_OTHER): Payer: Medicare HMO

## 2023-02-11 DIAGNOSIS — M978XXA Periprosthetic fracture around other internal prosthetic joint, initial encounter: Secondary | ICD-10-CM

## 2023-02-11 DIAGNOSIS — Z96649 Presence of unspecified artificial hip joint: Secondary | ICD-10-CM

## 2023-02-11 NOTE — Addendum Note (Signed)
Addended by: Wendi Maya on: 02/11/2023 01:38 PM   Modules accepted: Orders

## 2023-02-11 NOTE — Progress Notes (Signed)
Post-Op Visit Note   Patient: Richard Glass           Date of Birth: [birth year]           MRN: [id number] Visit Date: 02/11/2023 PCP: Shanna Cisco, NP   Assessment & Plan:  Chief Complaint:  Chief Complaint  Patient presents with   Right Hip - Follow-up    ORIF periprosthetic femur fracture 11/18/2022   Visit Diagnoses:  1. Periprosthetic fracture of femur following total replacement of hip, initial encounter     Plan: Patient returns today 3 months status post ORIF right periprosthetic femur fracture.   Mainly complains of ankle and foot swelling.  Reports no significant pain in the hip or thigh region.  He is using a walker still.  Examination of right hip shows fully healed surgical scar.  Decent range of motion of the hip without significant pain.  X-rays reviewed with the patient which shows continued healing but with notable fracture lines concerning for delayed union.  He is in a bit of varus alignment but this is not any worse than the prior x-rays.  Will order bone stimulator for this.  Will also obtain vitamin D and calcium levels.  He is to modify his activities and only weight-bear as tolerated to the right lower extremity.  Recheck in 6 weeks with two-view x-rays of the right femur.  Follow-Up Instructions: Return in about 6 weeks (around 03/25/2023).   Orders:  Orders Placed This Encounter  Procedures   XR FEMUR, MIN 2 VIEWS RIGHT   No orders of the defined types were placed in this encounter.   Imaging: XR FEMUR, MIN 2 VIEWS RIGHT  Result Date: 02/11/2023 2 view x-rays of the right femur show stable appearance of the proximal screw that has broken from the plate.  The cerclage cables are stable.  The fracture is also stable and has additional callus formation compared to the prior x-rays.  Overall appearance shows that the fracture is healing.   PMFS History: Patient Active Problem List   Diagnosis Date Noted   Malnutrition of moderate degree  11/21/2022   Closed fracture of lateral portion of right tibial plateau 11/18/2022   Periprosthetic fracture of femur at tip of prosthesis 11/17/2022   Lactic acidosis 11/17/2022   Chronic migraine w/o aura w/o status migrainosus, not intractable 03/14/2022   Wedge compression fracture of third thoracic vertebra, initial encounter for closed fracture (HCC) 09/14/2020   Status post right hip replacement 02/15/2020   Status post total replacement of right hip 11/22/2019   Painful orthopaedic hardware (HCC) 11/11/2019   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   Weight loss, non-intentional 10/14/2019   Bilateral leg weakness 09/03/2019   S/P right hip fracture 07/07/2019   Right leg pain 06/26/2019   Femoral fracture (HCC) 06/26/2019   Hearing loss 05/01/2019   Chronic left leg pain 11/20/2018   Allodynia, left leg 11/20/2018   History of traumatic brain injury 02/23/2018   High risk medication use, Combination of Opioid and Benzodiazapine 02/12/2018   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 08/65/7846   Encephalomalacia on imaging study 05/01/2017   SI (sacroiliac) pain 03/20/2017   Enchondroma of left fibular head 05/17/2016   Chronic pain syndrome 01/18/2016   Anxiety state 08/08/2013   Cerumen impaction 08/06/2013   Insomnia 02/02/2013   Right flank pain 05/21/2011   Tremor 10/31/2009   Encounter for chronic pain management 07/04/2009   Restless leg 09/29/2007   Hyperlipidemia  11/13/2006   Allergic rhinitis 11/13/2006   GASTROESOPHAGEAL REFLUX, NO ESOPHAGITIS 11/13/2006   Low back pain 11/13/2006   Convulsions (HCC) 11/13/2006   Seizure disorder (HCC) 11/13/2006   Past Medical History:  Diagnosis Date   Abnormal head CT 05/2011   Cystic encephalomalacia left temptemporal and parietal regions from remote injury.   Allergic rhinitis 11/13/2006        Anxiety    Anxiety state 08/08/2013   Asthma    ASTHMA, INTERMITTENT 07/08/2010   Qualifier: Diagnosis of   By: Sheffield Slider MD, Wayne     Back pain of lumbar region with sciatica 11/13/2006   MRI 12/2014 - lumbar DDD without focal neural impingement  MRI Lumbar 10/30/16 [facility]) Small central L5-S1 disc extrusion with minimal cranial migration and associated annular fissure approaches descending left S1 nerve roots without contact or displacement. Minimal bulging disc height loss without spinal canal stenosis or neural foraminal narrowing.  Minimal bulging disc at L2-L3 through L4-L5 without spinal canal stenosis or neural foraminal narrowing.  MRI Sacrum 10/30/16 [facility]) No fracture. Small lesion left sacral ala most likely a subchondral cyst/erosion adjacent to the sacroiliac joint.      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 The craniocervical junction is normal and cervical cord signal is normal. The central spinal canal and neural foramina are widely patent at all levels. Scattered, mild degenerative change appears most notable at C4-5. Imaged paraspinous structures are unremarkable.   IMPRESSION: Negative for central canal or foraminal narrowing. No finding to explain the patient's symptoms. Scattered, mild facet degenerative disease is noted.    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 16/06/9603   Morton's neuroma of left foot 01/18/2016   Osteopenia 10/10/2016   Overweight(278.02) 06/13/2009   Qualifier: Diagnosis of  By: Terese Door     Pain in joint, shoulder region 07/05/2014   LEFT > Right Reports hx rotator cuff surgery on rigt shoulder previously (Dr Tamala Bari) but no notes available XRAY right shoulder showed some proliferative changes distal rigt clavicle    Rash and nonspecific skin eruption 10/10/2016   Restless leg 09/29/2007   Qualifier: Diagnosis of  By: Sheffield Slider MD, Wayne     Restless legs syndrome (RLS)    Sciatica of left side 10/26/2014   Seizures (HCC)    last >2yrs   SI (sacroiliac) pain 03/20/2017   Sinusitis chronic, frontal  Status post lumbar spinal fusion 03/20/2017   Subacromial or subdeltoid bursitis 08/08/2009   MRI C-spine 2011([facility]) - normal MRI left shoulder 10/2010 ([facility]) - AC degenerative disease, normal rotator cuff 12/2012 -debridement, acromioplasty and distal clavicle excision of left shoulder, arthroscopy also performed an no need for rotator cuff repair - performed by [facility] 02/2013 - Nerve Conduction Studies ([facility]) - ulnar nerve compression 02/2013- taken back to OR for Ulnar nerve decompression 06/2013 Repeat MRI left shoulder - subacromial/subdeltoid bursitis, a small intersubstance tear to the distal infraspinatus and evidence of interval resection of the distal clavicle 06/2013 - steroid injection of left biceps 09/2013 -Repeat EMG showed improvement of ulnar nerve compression 10/2013 - referred to [facility] for second opinion due to intractable pain 11/2013 - Seen  by Dr. 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) found 2 small tubular adenomas (3 mm & 4 mm) - recommend follow up surveillance colonoscopy in 7 years.    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   Presumed surgery June 2014     Family History  Problem Relation Age of Onset   Hypertension Mother    Cancer Father        lung   Heart disease Father    Seizures Neg Hx    Colon cancer Neg Hx    Colon polyps Neg Hx    Esophageal cancer Neg Hx    Stomach cancer Neg Hx    Rectal cancer Neg Hx     Past Surgical History:  Procedure Laterality Date   BRAIN SURGERY  1975   struck by golf ball-52 yrs old   CRANIOTOMY  1973   metal plate   HARDWARE REMOVAL Right 11/22/2019   Procedure: HARDWARE REMOVAL;  Surgeon: Tarry Kos, MD;  Location: [facility] OR;  Service: Orthopedics;  Laterality: Right;   HIP PINNING,CANNULATED Right 06/27/2019   Procedure: HIP PERCUTANEOUS PINNING;  Surgeon: Cammy Copa, MD;  Location: [facility] OR;  Service: Orthopedics;  Laterality: Right;   NASAL FRACTURE SURGERY  1997   ORIF PERIPROSTHETIC FRACTURE Right 11/18/2022   Procedure: OPEN REDUCTION INTERNAL FIXATION (ORIF) PERIPROSTHETIC FRACTURE;  Surgeon: Tarry Kos, MD;  Location: [facility] OR;  Service: Orthopedics;  Laterality: Right;   SACROILIAC JOINT FUSION Left 03/20/2017   Procedure: SACROILIAC JOINT FUSION;  Surgeon: Venita Lick, MD;  Location: [facility] OR;  Service: Orthopedics;  Laterality: Left;  90 mins   SHOULDER ARTHROSCOPY Left    "cleaned arthritis out"   SHOULDER SURGERY Right 2012   Rotator cuff surgery   TONSILLECTOMY     TOTAL HIP ARTHROPLASTY Right 11/22/2019   Procedure: CONVERSION OF PREVIOUS RIGHT HIP SURGERY TO TOTAL HIP ARTHROPLASTY ANTERIOR APPROACH, HARDWARE REMOVAL;   Surgeon: Tarry Kos, MD;  Location: [facility] OR;  Service: Orthopedics;  Laterality: Right;   UVULOPALATOPHARYNGOPLASTY (UPPP)/TONSILLECTOMY/SEPTOPLASTY  2001   Social History   Occupational History   Occupation: disabled  Tobacco Use   Smoking status: Former    Packs/day: 1.00    Years: 22.00    Additional pack years: 0.00    Total pack years: 22.00    Types: Cigarettes    Quit date: 06/22/2012    Years since quitting: 10.6   Smokeless tobacco: Current    Types: Snuff   Tobacco comments:  per patient uses "dip" about twice a day   Vaping Use   Vaping Use: Never used  Substance and Sexual Activity   Alcohol use: No    Alcohol/week: 0.0 standard drinks of alcohol    Comment: none since 7/11   Drug use: No   Sexual activity: Never

## 2023-02-12 ENCOUNTER — Telehealth: Payer: Self-pay

## 2023-02-12 LAB — VITAMIN D 25 HYDROXY (VIT D DEFICIENCY, FRACTURES): Vit D, 25-Hydroxy: 36 ng/mL (ref 30–100)

## 2023-02-12 LAB — CALCIUM: Calcium: 9.2 mg/dL (ref 8.6–10.3)

## 2023-02-12 NOTE — Telephone Encounter (Signed)
Called patient. No answer. LMOM. Scheduled him for June 4th at 8:15am. Ask him to call us back if that does not work for him.

## 2023-02-12 NOTE — Telephone Encounter (Signed)
Tried to call patient, no answer. LMOM.  Patient needs a follow up with Dr.Xu after June 3rd for more x-rays. He needs this appointment to get approval for his bone stimulator. I ask patient to call me back for scheduling.

## 2023-02-18 ENCOUNTER — Ambulatory Visit: Payer: Medicare HMO | Admitting: Orthopaedic Surgery

## 2023-02-18 ENCOUNTER — Ambulatory Visit (INDEPENDENT_AMBULATORY_CARE_PROVIDER_SITE_OTHER): Payer: Medicare HMO | Admitting: Physician Assistant

## 2023-02-18 ENCOUNTER — Other Ambulatory Visit (INDEPENDENT_AMBULATORY_CARE_PROVIDER_SITE_OTHER): Payer: Medicare HMO

## 2023-02-18 DIAGNOSIS — Z96649 Presence of unspecified artificial hip joint: Secondary | ICD-10-CM

## 2023-02-18 DIAGNOSIS — M978XXA Periprosthetic fracture around other internal prosthetic joint, initial encounter: Secondary | ICD-10-CM | POA: Diagnosis not present

## 2023-02-18 DIAGNOSIS — M79661 Pain in right lower leg: Secondary | ICD-10-CM

## 2023-02-18 DIAGNOSIS — M25551 Pain in right hip: Secondary | ICD-10-CM

## 2023-02-18 NOTE — Addendum Note (Signed)
Addended by: Rip Harbour on: 02/18/2023 02:57 PM   Modules accepted: Orders

## 2023-02-18 NOTE — Progress Notes (Signed)
Post-Op Visit Note   Patient: Richard Glass           Date of Birth: [birth year]           MRN: [id number] Visit Date: 02/18/2023 PCP: Shanna Cisco, NP   Assessment & Plan:  Chief Complaint:  Chief Complaint  Patient presents with   Right Leg - Routine Post Op   Visit Diagnoses:  1. Periprosthetic fracture of femur following total replacement of hip, initial encounter     Plan: Patient is a pleasant 52 year old gentleman who comes in today approximately 3 months status post ORIF right periprosthetic femur fracture, date of surgery 11/18/2022.  He was seen in our office last week where there is evidence of a delayed union.  Vitamin D and calcium obtained which were both within normal limits.  He notes mild to moderate discomfort at the hip but most of his pain over the past week is actually been to the lower leg and into the calf.  This has worsened.  He has associated swelling.  He is wearing compression socks with some help of the swelling.  He is taking oxycodone for pain.  He denies any chest pain or shortness of breath.  He is non-smoker.  Not on any anticoagulants.  No personal history of DVT or PE.  At this point, I would like to order a venous Doppler ultrasound right lower extremity rule out DVT.  Today, x-rays demonstrate minimal healing with persistent fracture lucency without signs of progression to the anterior aspect consistent with delayed union.  Now that he is 3 months out from surgery, we will order a bone stimulator to help with progression of healing.  Follow-up with [place] in 6 weeks for repeat evaluation and x-rays of the right femur.  Call with concerns or questions.  Follow-Up Instructions: Return in about 6 weeks (around 04/01/2023).   Orders:  Orders Placed This Encounter  Procedures   XR FEMUR, MIN 2 VIEWS RIGHT   No orders of the defined types were placed in this encounter.   Imaging: XR FEMUR, MIN 2 VIEWS RIGHT  Result Date: 02/18/2023 X-rays  demonstrate minimal healing with persistent fracture lucency without signs of progression to the anterior aspect consistent with delayed union   PMFS History: Patient Active Problem List   Diagnosis Date Noted   Malnutrition of moderate degree 11/21/2022   Closed fracture of lateral portion of right tibial plateau 11/18/2022   Periprosthetic fracture of femur at tip of prosthesis 11/17/2022   Lactic acidosis 11/17/2022   Chronic migraine w/o aura w/o status migrainosus, not intractable 03/14/2022   Wedge compression fracture of third thoracic vertebra, initial encounter for closed fracture (HCC) 09/14/2020   Status post right hip replacement 02/15/2020   Status post total replacement of right hip 11/22/2019   Painful orthopaedic hardware (HCC) 11/11/2019   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   Weight loss, non-intentional 10/14/2019   Bilateral leg weakness 09/03/2019   S/P right hip fracture 07/07/2019   Right leg pain 06/26/2019   Femoral fracture (HCC) 06/26/2019   Hearing loss 05/01/2019   Chronic left leg pain 11/20/2018   Allodynia, left leg 11/20/2018   History of traumatic brain injury 02/23/2018   High risk medication use, Combination of Opioid and Benzodiazapine 02/12/2018   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 40/98/1191   Encephalomalacia on imaging study 05/01/2017   SI (sacroiliac) pain 03/20/2017   Enchondroma of left fibular head 05/17/2016   Chronic  pain syndrome 01/18/2016   Anxiety state 08/08/2013   Cerumen impaction 08/06/2013   Insomnia 02/02/2013   Right flank pain 05/21/2011   Tremor 10/31/2009   Encounter for chronic pain management 07/04/2009   Restless leg 09/29/2007   Hyperlipidemia 11/13/2006   Allergic rhinitis 11/13/2006   GASTROESOPHAGEAL REFLUX, NO ESOPHAGITIS 11/13/2006   Low back pain 11/13/2006   Convulsions (HCC) 11/13/2006   Seizure disorder (HCC) 11/13/2006   Past Medical History:  Diagnosis Date   Abnormal head  CT 05/2011   Cystic encephalomalacia left temptemporal and parietal regions from remote injury.   Allergic rhinitis 11/13/2006        Anxiety    Anxiety state 08/08/2013   Asthma    ASTHMA, INTERMITTENT 07/08/2010   Qualifier: Diagnosis of  By: Sheffield Slider MD, Wayne     Back pain of lumbar region with sciatica 11/13/2006   MRI 12/2014 - lumbar DDD without focal neural impingement  MRI Lumbar 10/30/16 [facility]) Small central L5-S1 disc extrusion with minimal cranial migration and associated annular fissure approaches descending left S1 nerve roots without contact or displacement. Minimal bulging disc height loss without spinal canal stenosis or neural foraminal narrowing.  Minimal bulging disc at L2-L3 through L4-L5 without spinal canal stenosis or neural foraminal narrowing.  MRI Sacrum 10/30/16 [facility]) No fracture. Small lesion left sacral ala most likely a subchondral cyst/erosion adjacent to the sacroiliac joint.      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 The craniocervical junction is normal and cervical cord signal is normal. The central spinal canal and neural foramina are widely patent at all levels. Scattered, mild degenerative change appears most notable at C4-5. Imaged paraspinous structures are unremarkable.   IMPRESSION: Negative for central canal or foraminal narrowing. No finding to explain the patient's symptoms. Scattered, mild facet degenerative disease is noted.    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 02/72/5366   Morton's neuroma of left foot 01/18/2016   Osteopenia 10/10/2016   Overweight(278.02) 06/13/2009   Qualifier: Diagnosis of  By: Terese Door     Pain in joint, shoulder region 07/05/2014   LEFT > Right Reports hx rotator cuff surgery on rigt shoulder previously (Dr Tamala Bari) but no notes available XRAY right shoulder showed some proliferative changes distal rigt clavicle    Rash and nonspecific skin eruption 10/10/2016   Restless leg 09/29/2007   Qualifier: Diagnosis of  By: Sheffield Slider MD, Wayne     Restless legs syndrome (RLS)    Sciatica of left side 10/26/2014   Seizures (HCC)    last >79yrs   SI (sacroiliac) pain 03/20/2017   Sinusitis chronic, frontal    Status post lumbar spinal fusion 03/20/2017   Subacromial or subdeltoid bursitis 08/08/2009   MRI C-spine 2011([facility]) - normal MRI left shoulder 10/2010 ([facility]) - AC degenerative disease, normal rotator cuff 12/2012 -debridement, acromioplasty and distal clavicle excision of left shoulder, arthroscopy also performed an no need for rotator cuff repair - performed by [facility] 02/2013 - Nerve Conduction Studies ([facility]) - ulnar nerve compression 02/2013- taken back to OR for Ulnar nerve decompression 06/2013 Repeat MRI left shoulder - subacromial/subdeltoid bursitis, a small intersubstance tear to the distal infraspinatus and evidence of  interval resection of the distal clavicle 06/2013 - steroid injection of left biceps 09/2013 -Repeat EMG showed improvement of ulnar nerve compression 10/2013 - referred to [facility] for second opinion due to intractable pain 11/2013 - Seen by Dr. 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) found 2 small tubular adenomas (3 mm & 4 mm) - recommend follow up surveillance colonoscopy in 7 years.    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   Presumed surgery June 2014     Family History  Problem Relation Age of Onset   Hypertension Mother    Cancer Father        lung   Heart disease Father    Seizures Neg Hx    Colon cancer Neg Hx    Colon polyps Neg Hx    Esophageal cancer Neg Hx    Stomach cancer Neg Hx    Rectal cancer Neg Hx     Past Surgical History:  Procedure Laterality Date   BRAIN SURGERY  1975   struck by golf ball-52 yrs old   CRANIOTOMY  1973   metal plate   HARDWARE REMOVAL Right 11/22/2019   Procedure: HARDWARE REMOVAL;  Surgeon: Tarry Kos, MD;  Location: [facility] OR;  Service: Orthopedics;  Laterality: Right;   HIP PINNING,CANNULATED Right 06/27/2019   Procedure: HIP PERCUTANEOUS PINNING;  Surgeon: Cammy Copa, MD;  Location: [facility] OR;  Service: Orthopedics;  Laterality: Right;   NASAL FRACTURE SURGERY  1997   ORIF PERIPROSTHETIC FRACTURE Right 11/18/2022   Procedure: OPEN REDUCTION INTERNAL FIXATION (ORIF) PERIPROSTHETIC FRACTURE;  Surgeon: Tarry Kos, MD;  Location: [facility] OR;  Service: Orthopedics;  Laterality: Right;   SACROILIAC JOINT FUSION Left 03/20/2017   Procedure: SACROILIAC JOINT FUSION;  Surgeon: Venita Lick, MD;  Location: [facility] OR;  Service: Orthopedics;  Laterality: Left;  90 mins   SHOULDER ARTHROSCOPY Left    "cleaned  arthritis out"   SHOULDER SURGERY Right 2012   Rotator cuff surgery   TONSILLECTOMY     TOTAL HIP ARTHROPLASTY Right 11/22/2019   Procedure: CONVERSION OF PREVIOUS RIGHT HIP SURGERY TO TOTAL HIP ARTHROPLASTY ANTERIOR APPROACH, HARDWARE REMOVAL;  Surgeon: Tarry Kos, MD;  Location: [facility] OR;  Service: Orthopedics;  Laterality: Right;   UVULOPALATOPHARYNGOPLASTY (UPPP)/TONSILLECTOMY/SEPTOPLASTY  2001   Social History   Occupational History   Occupation: disabled  Tobacco Use   Smoking status: Former    Packs/day: 1.00    Years: 22.00  Additional pack years: 0.00    Total pack years: 22.00    Types: Cigarettes    Quit date: 06/22/2012    Years since quitting: 10.6   Smokeless tobacco: Current    Types: Snuff   Tobacco comments:    per patient uses "dip" about twice a day   Vaping Use   Vaping Use: Never used  Substance and Sexual Activity   Alcohol use: No    Alcohol/week: 0.0 standard drinks of alcohol    Comment: none since 7/11   Drug use: No   Sexual activity: Never

## 2023-02-19 ENCOUNTER — Ambulatory Visit (HOSPITAL_COMMUNITY)
Admission: RE | Admit: 2023-02-19 | Discharge: 2023-02-19 | Disposition: A | Discharge status: Home / Self Care | Payer: Medicare HMO | Source: Ambulatory Visit | Attending: Physician Assistant | Admitting: Physician Assistant

## 2023-02-19 DIAGNOSIS — M79661 Pain in right lower leg: Secondary | ICD-10-CM | POA: Insufficient documentation

## 2023-03-15 ENCOUNTER — Other Ambulatory Visit (HOSPITAL_COMMUNITY): Payer: Self-pay

## 2023-03-15 ENCOUNTER — Telehealth: Payer: Self-pay

## 2023-03-15 NOTE — Telephone Encounter (Signed)
Patient Advocate Encounter   Received notification from [facility] that prior authorization is required for Emgality 120MG /ML auto-injectors (migraine)   Submitted: 03-15-2023 Key [id number]  Status is pending

## 2023-03-17 ENCOUNTER — Other Ambulatory Visit (HOSPITAL_COMMUNITY): Payer: Self-pay

## 2023-03-17 NOTE — Telephone Encounter (Signed)
Pharmacy Patient Advocate Encounter  Prior Authorization for Emgality 120MG /ML auto-injectors (migraine) has been APPROVED by [employer] from 09/16/2022 to 09/16/2023.   PA # PA Case: [id number]  Copay is $0 per [facility] test claim.

## 2023-03-18 ENCOUNTER — Other Ambulatory Visit: Payer: Self-pay | Admitting: Neurology

## 2023-03-24 NOTE — Progress Notes (Unsigned)
Office Visit Note   Patient: Richard Glass           Date of Birth: [birth year]           MRN: [id number] Visit Date: 03/25/2023              Requested by: Shanna Cisco, NP [address] [place],  [place] [address] PCP: Shanna Cisco, NP   Assessment & Plan: Visit Diagnoses:  1. Periprosthetic fracture of femur following total replacement of hip, initial encounter     Plan: Impression is 4 months status post ORIF right periprosthetic femur fracture.  Patient is clinically and radiographically improving although he does have evidence of early hardware failure that occurred between April and May.  Fortunately, his fracture has consolidated quite a bit.  I would like for him to continue using the bone stimulator for another 2 months.  I will order outpatient physical therapy as I think he would benefit from gait training and strengthening exercises.  He will follow-up with [place] in 2 months for repeat evaluation and x-rays of the right femur.  Call with concerns or questions in the meantime.  Follow-Up Instructions: No follow-ups on file.   Orders:  Orders Placed This Encounter  Procedures   XR FEMUR, MIN 2 VIEWS RIGHT   No orders of the defined types were placed in this encounter.     Procedures: No procedures performed   Clinical Data: No additional findings.   Subjective: Chief Complaint  Patient presents with   Right Hip - Follow-up    Periprosthetic femur fracture ORIF 11/18/2022    HPI patient is a pleasant 52 year old gentleman who comes in today 4 months status post ORIF right periprosthetic femur fracture 11/18/2022.  He has been doing better.  He has been using a bone stimulator for the past month or so.  He is still ambulating with a walker and notes that he finished home health physical therapy about a month ago.     Objective: Vital Signs: There were no vitals taken for this visit.    Ortho Exam right hip exam reveals painless hip flexion.  He  does have a fair amount of quad weakness.  He is neurovascular intact distally.  Specialty Comments:  No specialty comments available.  Imaging: XR FEMUR, MIN 2 VIEWS RIGHT  Result Date: 03/25/2023 X-rays demonstrate abundant callus formation to the anterior medial cortex.  This prosthesis is in slightly more varus.    PMFS History: Patient Active Problem List   Diagnosis Date Noted   Malnutrition of moderate degree 11/21/2022   Closed fracture of lateral portion of right tibial plateau 11/18/2022   Periprosthetic fracture of femur at tip of prosthesis 11/17/2022   Lactic acidosis 11/17/2022   Chronic migraine w/o aura w/o status migrainosus, not intractable 03/14/2022   Wedge compression fracture of third thoracic vertebra, initial encounter for closed fracture (HCC) 09/14/2020   Status post right hip replacement 02/15/2020   Status post total replacement of right hip 11/22/2019   Painful orthopaedic hardware (HCC) 11/11/2019   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   Weight loss, non-intentional 10/14/2019   Bilateral leg weakness 09/03/2019   S/P right hip fracture 07/07/2019   Right leg pain 06/26/2019   Femoral fracture (HCC) 06/26/2019   Hearing loss 05/01/2019   Chronic left leg pain 11/20/2018   Allodynia, left leg 11/20/2018   History of traumatic brain injury 02/23/2018   High risk medication use,  Combination of Opioid and Benzodiazapine 02/12/2018   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 16/06/9603   Encephalomalacia on imaging study 05/01/2017   SI (sacroiliac) pain 03/20/2017   Enchondroma of left fibular head 05/17/2016   Chronic pain syndrome 01/18/2016   Anxiety state 08/08/2013   Cerumen impaction 08/06/2013   Insomnia 02/02/2013   Right flank pain 05/21/2011   Tremor 10/31/2009   Encounter for chronic pain management 07/04/2009   Restless leg 09/29/2007   Hyperlipidemia 11/13/2006   Allergic rhinitis 11/13/2006   GASTROESOPHAGEAL  REFLUX, NO ESOPHAGITIS 11/13/2006   Low back pain 11/13/2006   Convulsions (HCC) 11/13/2006   Seizure disorder (HCC) 11/13/2006   Past Medical History:  Diagnosis Date   Abnormal head CT 05/2011   Cystic encephalomalacia left temptemporal and parietal regions from remote injury.   Allergic rhinitis 11/13/2006        Anxiety    Anxiety state 08/08/2013   Asthma    ASTHMA, INTERMITTENT 07/08/2010   Qualifier: Diagnosis of  By: Sheffield Slider MD, Wayne     Back pain of lumbar region with sciatica 11/13/2006   MRI 12/2014 - lumbar DDD without focal neural impingement  MRI Lumbar 10/30/16 [facility]) Small central L5-S1 disc extrusion with minimal cranial migration and associated annular fissure approaches descending left S1 nerve roots without contact or displacement. Minimal bulging disc height loss without spinal canal stenosis or neural foraminal narrowing.  Minimal bulging disc at L2-L3 through L4-L5 without spinal canal stenosis or neural foraminal narrowing.  MRI Sacrum 10/30/16 [facility]) No fracture. Small lesion left sacral ala most likely a subchondral cyst/erosion adjacent to the sacroiliac joint.      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 The craniocervical junction is normal and cervical cord signal is normal. The central spinal canal and neural foramina are widely patent at all levels. Scattered, mild degenerative change appears most notable at C4-5. Imaged paraspinous structures are unremarkable.   IMPRESSION: Negative for central canal or foraminal narrowing. No finding to explain the patient's symptoms. Scattered, mild facet degenerative disease is noted.    Cervicogenic migraine    Chronic low back pain    Chronic pain 01/18/2016   Chronic pain syndrome 01/18/2016   Coccyx  pain 02/06/2015   Contact dermatitis or eczema 02/13/2018   Convulsions (HCC) 11/13/2006     Cystic encephalomalacia left temporal and parietal regions from remote injury.    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 54/05/8118   Morton's neuroma of left foot 01/18/2016   Osteopenia 10/10/2016   Overweight(278.02) 06/13/2009   Qualifier: Diagnosis of  By: Terese Door     Pain in joint, shoulder region 07/05/2014   LEFT > Right Reports hx rotator cuff surgery on rigt shoulder  previously (Dr Tamala Bari) but no notes available XRAY right shoulder showed some proliferative changes distal rigt clavicle    Rash and nonspecific skin eruption 10/10/2016   Restless leg 09/29/2007   Qualifier: Diagnosis of  By: Sheffield Slider MD, Wayne     Restless legs syndrome (RLS)    Sciatica of left side 10/26/2014   Seizures (HCC)    last >58yrs   SI (sacroiliac) pain 03/20/2017   Sinusitis chronic, frontal    Status post lumbar spinal fusion 03/20/2017   Subacromial or subdeltoid bursitis 08/08/2009   MRI C-spine 2011([facility]) - normal MRI left shoulder 10/2010 ([facility]) - AC degenerative disease, normal rotator cuff 12/2012 -debridement, acromioplasty and distal clavicle excision of left shoulder, arthroscopy also performed an no need for rotator cuff repair - performed by [facility] 02/2013 - Nerve Conduction Studies ([facility]) - ulnar nerve compression 02/2013-  taken back to OR for Ulnar nerve decompression 06/2013 Repeat MRI left shoulder - subacromial/subdeltoid bursitis, a small intersubstance tear to the distal infraspinatus and evidence of interval resection of the distal clavicle 06/2013 - steroid injection of left biceps 09/2013 -Repeat EMG showed improvement of ulnar nerve compression 10/2013 - referred to [facility] for second opinion due to intractable pain 11/2013 - Seen by Dr. 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) found 2 small tubular adenomas (3 mm & 4 mm) - recommend follow up surveillance colonoscopy in 7 years.    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   Presumed surgery June 2014     Family History  Problem Relation Age of Onset   Hypertension Mother    Cancer Father        lung   Heart disease Father    Seizures Neg Hx    Colon cancer Neg Hx    Colon polyps Neg Hx    Esophageal cancer Neg Hx    Stomach cancer Neg Hx    Rectal cancer Neg Hx     Past Surgical History:  Procedure Laterality Date   BRAIN SURGERY  1975   struck by golf ball-52 yrs old   CRANIOTOMY  1973   metal plate   HARDWARE REMOVAL Right 11/22/2019   Procedure: HARDWARE REMOVAL;  Surgeon: Tarry Kos, MD;  Location: [facility] OR;  Service: Orthopedics;  Laterality: Right;   HIP PINNING,CANNULATED Right 06/27/2019   Procedure: HIP PERCUTANEOUS PINNING;  Surgeon: Cammy Copa, MD;  Location[facility] OR;  Service: Orthopedics;  Laterality: Right;   NASAL FRACTURE SURGERY  1997   ORIF PERIPROSTHETIC FRACTURE Right 11/18/2022   Procedure: OPEN REDUCTION INTERNAL FIXATION (ORIF) PERIPROSTHETIC FRACTURE;  Surgeon: Tarry Kos, MD;  Location: [facility] OR;  Service: Orthopedics;  Laterality: Right;   SACROILIAC JOINT FUSION Left  03/20/2017   Procedure: SACROILIAC JOINT FUSION;  Surgeon: Venita Lick, MD;  Location: [facility] OR;  Service: Orthopedics;  Laterality: Left;  90 mins   SHOULDER ARTHROSCOPY Left    "cleaned arthritis out"   SHOULDER SURGERY Right 2012   Rotator cuff surgery   TONSILLECTOMY     TOTAL HIP ARTHROPLASTY Right 11/22/2019   Procedure: CONVERSION OF PREVIOUS RIGHT HIP SURGERY TO TOTAL HIP ARTHROPLASTY ANTERIOR APPROACH, HARDWARE REMOVAL;  Surgeon: Tarry Kos, MD;  Location: [facility] OR;  Service: Orthopedics;  Laterality: Right;  UVULOPALATOPHARYNGOPLASTY (UPPP)/TONSILLECTOMY/SEPTOPLASTY  2001   Social History   Occupational History   Occupation: disabled  Tobacco Use   Smoking status: Former    Packs/day: 1.00    Years: 22.00    Additional pack years: 0.00    Total pack years: 22.00    Types: Cigarettes    Quit date: 06/22/2012    Years since quitting: 10.7   Smokeless tobacco: Current    Types: Snuff   Tobacco comments:    per patient uses "dip" about twice a day   Vaping Use   Vaping Use: Never used  Substance and Sexual Activity   Alcohol use: No    Alcohol/week: 0.0 standard drinks of alcohol    Comment: none since 7/11   Drug use: No   Sexual activity: Never

## 2023-03-25 ENCOUNTER — Ambulatory Visit (INDEPENDENT_AMBULATORY_CARE_PROVIDER_SITE_OTHER): Payer: Medicare HMO | Admitting: Physician Assistant

## 2023-03-25 ENCOUNTER — Other Ambulatory Visit (INDEPENDENT_AMBULATORY_CARE_PROVIDER_SITE_OTHER): Payer: Medicare HMO

## 2023-03-25 DIAGNOSIS — Z96649 Presence of unspecified artificial hip joint: Secondary | ICD-10-CM

## 2023-03-25 DIAGNOSIS — M978XXA Periprosthetic fracture around other internal prosthetic joint, initial encounter: Secondary | ICD-10-CM

## 2023-04-01 ENCOUNTER — Ambulatory Visit: Payer: Medicare HMO | Admitting: Orthopaedic Surgery

## 2023-04-22 ENCOUNTER — Other Ambulatory Visit: Payer: Self-pay | Admitting: Neurology

## 2023-04-22 NOTE — Telephone Encounter (Signed)
Dispenses   Received 90 day supply 0n5/7/24 meaning on 04/23/23 will be due  Dispensed Days Supply Quantity Provider Pharmacy  PHENOBARBITAL 64.8 MG TABLET 01/21/2023 90 270 each Micki Riley, MD [facility] [phone] - G...  PHENOBARBITAL 64.8 MG TABLET 09/23/2022 90 270 each Glean Salvo, NP [facility] [phone] - G...  PHENOBARBITAL 64.8 MG TABLET 06/25/2022 90 270 each Glean Salvo, NP [facility] [phone] - G.Marland KitchenMarland Kitchen

## 2023-04-22 NOTE — Telephone Encounter (Signed)
Called and spoke to pt and he stated that he took last 3 tabs last night. Please send rx

## 2023-04-22 NOTE — Addendum Note (Signed)
Addended by: Eather Colas E on: 04/22/2023 04:00 PM   Modules accepted: Orders

## 2023-04-23 MED ORDER — PHENOBARBITAL 64.8 MG PO TABS: 194.4000 mg | ORAL_TABLET | Freq: Every day | ORAL | 1 refills | Status: DC

## 2023-05-27 ENCOUNTER — Ambulatory Visit (INDEPENDENT_AMBULATORY_CARE_PROVIDER_SITE_OTHER): Payer: Medicare HMO | Admitting: Orthopaedic Surgery

## 2023-05-27 ENCOUNTER — Other Ambulatory Visit (INDEPENDENT_AMBULATORY_CARE_PROVIDER_SITE_OTHER): Payer: Medicare HMO

## 2023-05-27 ENCOUNTER — Encounter: Payer: Self-pay | Admitting: Orthopaedic Surgery

## 2023-05-27 DIAGNOSIS — Z96649 Presence of unspecified artificial hip joint: Secondary | ICD-10-CM | POA: Diagnosis not present

## 2023-05-27 DIAGNOSIS — M978XXA Periprosthetic fracture around other internal prosthetic joint, initial encounter: Secondary | ICD-10-CM

## 2023-05-27 NOTE — Progress Notes (Signed)
Office Visit Note   Patient: Richard Glass           Date of Birth: October 21, 1970           MRN: [id number] Visit Date: 05/27/2023              Requested by: Shanna Cisco, NP [address] [place],  [place] [address] PCP: Shanna Cisco, NP   Assessment & Plan: Visit Diagnoses:  1. Periprosthetic fracture of femur following total replacement of hip, initial encounter     Plan: His x-rays show that the fracture is completely consolidated at this time.  He is released activity as tolerated.  Will see him back as needed.  Follow-Up Instructions: Return if symptoms worsen or fail to improve.   Orders:  Orders Placed This Encounter  Procedures   XR FEMUR, MIN 2 VIEWS RIGHT   No orders of the defined types were placed in this encounter.     Procedures: No procedures performed   Clinical Data: No additional findings.   Subjective: Chief Complaint  Patient presents with   Right Hip - Follow-up    ORIF periprosthetic femur fracture     HPI Richard Glass is 6 months status post ORIF right periprosthetic hip fracture.  And he feels soreness and decreased stamina in the leg but otherwise he is doing okay.  He is not reporting any pain. Review of Systems   Objective: Vital Signs: There were no vitals taken for this visit.  Physical Exam  Ortho Exam Examination right leg shows fully healed surgical scar.  He has a slightly antalgic gait. Specialty Comments:  No specialty comments available.  Imaging: XR FEMUR, MIN 2 VIEWS RIGHT  Result Date: 05/27/2023 X-rays of the right femur show complete consolidation of the periprosthetic femur fracture.    PMFS History: Patient Active Problem List   Diagnosis Date Noted   Malnutrition of moderate degree 11/21/2022   Closed fracture of lateral portion of right tibial plateau 11/18/2022   Periprosthetic fracture of femur at tip of prosthesis 11/17/2022   Lactic acidosis 11/17/2022   Chronic migraine w/o aura w/o status  migrainosus, not intractable 03/14/2022   Wedge compression fracture of third thoracic vertebra, initial encounter for closed fracture (HCC) 09/14/2020   Status post right hip replacement 02/15/2020   Status post total replacement of right hip 11/22/2019   Painful orthopaedic hardware (HCC) 11/11/2019   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   Weight loss, non-intentional 10/14/2019   Bilateral leg weakness 09/03/2019   S/P right hip fracture 07/07/2019   Right leg pain 06/26/2019   Femoral fracture (HCC) 06/26/2019   Hearing loss 05/01/2019   Chronic left leg pain 11/20/2018   Allodynia, left leg 11/20/2018   History of traumatic brain injury 02/23/2018   High risk medication use, Combination of Opioid and Benzodiazapine 02/12/2018   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 02/72/5366   Encephalomalacia on imaging study 05/01/2017   SI (sacroiliac) pain 03/20/2017   Enchondroma of left fibular head 05/17/2016   Chronic pain syndrome 01/18/2016   Anxiety state 08/08/2013   Cerumen impaction 08/06/2013   Insomnia 02/02/2013   Right flank pain 05/21/2011   Tremor 10/31/2009   Encounter for chronic pain management 07/04/2009   Restless leg 09/29/2007   Hyperlipidemia 11/13/2006   Allergic rhinitis 11/13/2006   GASTROESOPHAGEAL REFLUX, NO ESOPHAGITIS 11/13/2006   Low back pain 11/13/2006   Convulsions (HCC) 11/13/2006   Seizure disorder (HCC) 11/13/2006  Past Medical History:  Diagnosis Date   Abnormal head CT 05/2011   Cystic encephalomalacia left temptemporal and parietal regions from remote injury.   Allergic rhinitis 11/13/2006        Anxiety    Anxiety state 08/08/2013   Asthma    ASTHMA, INTERMITTENT 07/08/2010   Qualifier: Diagnosis of  By: Sheffield Slider MD, Wayne     Back pain of lumbar region with sciatica 11/13/2006   MRI 12/2014 - lumbar DDD without focal neural impingement  MRI Lumbar 10/30/16 [facility]) Small central L5-S1 disc extrusion with  minimal cranial migration and associated annular fissure approaches descending left S1 nerve roots without contact or displacement. Minimal bulging disc height loss without spinal canal stenosis or neural foraminal narrowing.  Minimal bulging disc at L2-L3 through L4-L5 without spinal canal stenosis or neural foraminal narrowing.  MRI Sacrum 10/30/16 [facility]) No fracture. Small lesion left sacral ala most likely a subchondral cyst/erosion adjacent to the sacroiliac joint.      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 The craniocervical junction is normal and cervical cord signal is normal. The central spinal canal and neural foramina are widely patent at all levels. Scattered, mild degenerative change appears most notable at C4-5. Imaged paraspinous structures are unremarkable.   IMPRESSION: Negative for central canal or foraminal narrowing. No finding to explain the patient's symptoms. Scattered, mild facet degenerative disease is noted.    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 08/65/7846   Morton's neuroma of left foot 01/18/2016   Osteopenia 10/10/2016   Overweight(278.02) 06/13/2009   Qualifier: Diagnosis of  By: Terese Door     Pain in joint, shoulder region 07/05/2014   LEFT > Right Reports hx rotator cuff surgery on rigt shoulder previously (Dr Tamala Bari) but no notes available XRAY right shoulder showed some proliferative changes distal rigt clavicle    Rash and nonspecific skin eruption 10/10/2016   Restless leg 09/29/2007   Qualifier: Diagnosis of  By: Sheffield Slider MD, Wayne     Restless legs syndrome (RLS)    Sciatica of left side 10/26/2014   Seizures (HCC)    last >54yrs   SI (sacroiliac) pain 03/20/2017   Sinusitis chronic, frontal    Status post lumbar spinal fusion 03/20/2017   Subacromial or subdeltoid bursitis 08/08/2009   MRI C-spine 2011([facility]) - normal MRI left shoulder 10/2010 ([facility]) - AC degenerative disease, normal  rotator cuff 12/2012 -debridement, acromioplasty and distal clavicle excision of left shoulder, arthroscopy also performed an no need for rotator cuff repair - performed by [facility] 02/2013 - Nerve Conduction Studies ([facility]) - ulnar nerve compression 02/2013- taken back to OR for Ulnar nerve decompression 06/2013 Repeat MRI left shoulder - subacromial/subdeltoid bursitis, a small intersubstance tear to the distal infraspinatus and evidence of interval resection of the distal clavicle 06/2013 - steroid injection of left biceps 09/2013 -Repeat EMG showed improvement of ulnar nerve compression 10/2013 - referred to [facility] for second opinion due to intractable pain 11/2013 - Seen by Dr. August Saucer at [facility]; discussed risks/benefits of surgical intervention and bursectomy, no plan for surgery at this time      Traumatic cerebral hemorrhage [facility] 1975   Tubular adenoma of colon  01/31/2020   01/2020 screening colonoscopy ([facility]) found 2 small tubular adenomas (3 mm & 4 mm) - recommend follow up surveillance colonoscopy in 7 years.    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   Presumed surgery June 2014     Family History  Problem Relation Age of Onset   Hypertension Mother    Cancer Father        lung   Heart disease Father    Seizures Neg Hx    Colon cancer Neg Hx    Colon polyps Neg Hx    Esophageal cancer Neg Hx    Stomach cancer Neg Hx    Rectal cancer Neg Hx     Past Surgical History:  Procedure Laterality Date   BRAIN SURGERY  1975   struck by golf ball-52 yrs old   CRANIOTOMY  1973   metal plate   HARDWARE REMOVAL Right 11/22/2019   Procedure: HARDWARE REMOVAL;  Surgeon: Tarry Kos, MD;  Location: [facility] OR;  Service: Orthopedics;  Laterality: Right;   HIP PINNING,CANNULATED Right 06/27/2019   Procedure: HIP PERCUTANEOUS PINNING;  Surgeon: Cammy Copa, MD;  Location: [facility] OR;  Service: Orthopedics;  Laterality: Right;   NASAL FRACTURE SURGERY  1997   ORIF PERIPROSTHETIC FRACTURE Right 11/18/2022   Procedure: OPEN REDUCTION INTERNAL FIXATION (ORIF) PERIPROSTHETIC FRACTURE;  Surgeon: Tarry Kos, MD;  Location: [facility] OR;  Service: Orthopedics;  Laterality: Right;   SACROILIAC JOINT FUSION Left 03/20/2017   Procedure: SACROILIAC JOINT FUSION;  Surgeon: Venita Lick, MD;  Location[facility];  Service: Orthopedics;  Laterality: Left;  90 mins   SHOULDER ARTHROSCOPY Left    "cleaned arthritis out"   SHOULDER SURGERY Right 2012   Rotator cuff surgery   TONSILLECTOMY     TOTAL HIP ARTHROPLASTY Right 11/22/2019   Procedure: CONVERSION OF PREVIOUS RIGHT HIP SURGERY TO TOTAL HIP ARTHROPLASTY ANTERIOR APPROACH, HARDWARE REMOVAL;  Surgeon: Tarry Kos, MD;  Location: [facility] OR;  Service: Orthopedics;  Laterality: Right;   UVULOPALATOPHARYNGOPLASTY (UPPP)/TONSILLECTOMY/SEPTOPLASTY  2001   Social History   Occupational History    Occupation: disabled  Tobacco Use   Smoking status: Former    Current packs/day: 0.00    Average packs/day: 1 pack/day for 22.0 years (22.0 ttl pk-yrs)    Types: Cigarettes    Start date: 06/22/1990    Quit date: 06/22/2012    Years since quitting: 10.9   Smokeless tobacco: Current    Types: Snuff   Tobacco comments:    Richard patient uses "dip" about twice a day   Vaping Use   Vaping status: Never Used  Substance and Sexual Activity   Alcohol use: No  Alcohol/week: 0.0 standard drinks of alcohol    Comment: none since 7/11   Drug use: No   Sexual activity: Never
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
## 2023-08-07 ENCOUNTER — Ambulatory Visit (INDEPENDENT_AMBULATORY_CARE_PROVIDER_SITE_OTHER): Payer: Medicare HMO | Admitting: Neurology

## 2023-08-07 ENCOUNTER — Encounter: Payer: Self-pay | Admitting: Neurology

## 2023-08-07 VITALS — BP 129/79 | HR 57 | Ht 69.0 in | Wt 166.0 lb

## 2023-08-07 DIAGNOSIS — G43709 Chronic migraine without aura, not intractable, without status migrainosus: Secondary | ICD-10-CM | POA: Diagnosis not present

## 2023-08-07 DIAGNOSIS — G40909 Epilepsy, unspecified, not intractable, without status epilepticus: Secondary | ICD-10-CM | POA: Diagnosis not present

## 2023-08-07 MED ORDER — ZONISAMIDE 100 MG PO CAPS: 100.0000 mg | ORAL_CAPSULE | Freq: Two times a day (BID) | ORAL | 3 refills | Status: AC

## 2023-08-07 MED ORDER — RIZATRIPTAN BENZOATE 10 MG PO TABS: ORAL_TABLET | ORAL | 11 refills | Status: AC

## 2023-08-07 MED ORDER — ZONISAMIDE 50 MG PO CAPS: 50.0000 mg | ORAL_CAPSULE | Freq: Two times a day (BID) | ORAL | 3 refills | Status: AC

## 2023-08-07 MED ORDER — EMGALITY 120 MG/ML ~~LOC~~ SOAJ: 1.0000 "pen " | SUBCUTANEOUS | 3 refills | Status: AC

## 2023-08-07 NOTE — Patient Instructions (Signed)
Check labs today  Continue current medications Make sure to take seizure medication as prescribed, not to miss any doses Follow up in 1 year

## 2023-08-08 LAB — COMPREHENSIVE METABOLIC PANEL
ALT: 5 [IU]/L (ref 0–44)
AST: 13 [IU]/L (ref 0–40)
Albumin: 4.8 g/dL (ref 3.8–4.9)
Alkaline Phosphatase: 130 [IU]/L — ABNORMAL HIGH (ref 44–121)
BUN/Creatinine Ratio: 14 (ref 9–20)
BUN: 11 mg/dL (ref 6–24)
Bilirubin Total: 0.2 mg/dL (ref 0.0–1.2)
CO2: 23 mmol/L (ref 20–29)
Calcium: 9.6 mg/dL (ref 8.7–10.2)
Chloride: 104 mmol/L (ref 96–106)
Creatinine, Ser: 0.78 mg/dL (ref 0.76–1.27)
Globulin, Total: 2.3 g/dL (ref 1.5–4.5)
Glucose: 91 mg/dL (ref 70–99)
Potassium: 4.5 mmol/L (ref 3.5–5.2)
Sodium: 142 mmol/L (ref 134–144)
Total Protein: 7.1 g/dL (ref 6.0–8.5)
eGFR: 106 mL/min/{1.73_m2} (ref >59)

## 2023-08-08 LAB — CBC WITH DIFFERENTIAL/PLATELET
Basophils Absolute: 0 10*3/uL (ref 0.0–0.2)
Basos: 0 %
EOS (ABSOLUTE): 0.1 10*3/uL (ref 0.0–0.4)
Eos: 1 %
Hematocrit: 43.4 % (ref 37.5–51.0)
Hemoglobin: 14 g/dL (ref 13.0–17.7)
Immature Grans (Abs): 0 10*3/uL (ref 0.0–0.1)
Immature Granulocytes: 1 %
Lymphocytes Absolute: 1.9 10*3/uL (ref 0.7–3.1)
Lymphs: 27 %
MCH: 29.9 pg (ref 26.6–33.0)
MCHC: 32.3 g/dL (ref 31.5–35.7)
MCV: 93 fL (ref 79–97)
Monocytes Absolute: 0.5 10*3/uL (ref 0.1–0.9)
Monocytes: 7 %
Neutrophils Absolute: 4.4 10*3/uL (ref 1.4–7.0)
Neutrophils: 64 %
Platelets: 333 10*3/uL (ref 150–450)
RBC: 4.68 x10E6/uL (ref 4.14–5.80)
RDW: 12.8 % (ref 11.6–15.4)
WBC: 6.9 10*3/uL (ref 3.4–10.8)

## 2023-08-08 LAB — PHENOBARBITAL LEVEL: Phenobarbital, Serum: 37 ug/mL (ref 15–40)

## 2023-08-08 LAB — ZONISAMIDE LEVEL: Zonisamide: 9 ug/mL — ABNORMAL LOW (ref 10.0–40.0)

## 2023-10-24 ENCOUNTER — Encounter: Payer: Self-pay | Admitting: Neurology

## 2023-10-24 ENCOUNTER — Other Ambulatory Visit: Payer: Self-pay | Admitting: Anesthesiology

## 2023-10-24 MED ORDER — PHENOBARBITAL 64.8 MG PO TABS: 194.4000 mg | ORAL_TABLET | Freq: Every day | ORAL | 1 refills | Status: DC

## 2023-11-07 ENCOUNTER — Other Ambulatory Visit (HOSPITAL_COMMUNITY): Payer: Self-pay

## 2023-11-07 ENCOUNTER — Telehealth: Payer: Self-pay

## 2023-11-07 NOTE — Telephone Encounter (Signed)
Pharmacy Patient Advocate Encounter   Received notification from CoverMyMeds that prior authorization for Emgality 120MG /ML auto-injectors (migraine) is required/requested.   Insurance verification completed.   The patient is insured through [place] .   Per test claim: The current 30 day co-pay is, $0.  No PA needed at this time. This test claim was processed through [facility]- copay amounts may vary at other pharmacies due to pharmacy/plan contracts, or as the patient moves through the different stages of their insurance plan.    Current PA on file good thru 09/15/2024

## 2023-11-15 ENCOUNTER — Encounter (HOSPITAL_COMMUNITY): Payer: Self-pay

## 2023-11-15 ENCOUNTER — Emergency Department (HOSPITAL_COMMUNITY)
Admission: EM | Admit: 2023-11-15 | Discharge: 2023-11-15 | Disposition: A | Discharge status: Home / Self Care | Attending: Emergency Medicine | Admitting: Emergency Medicine

## 2023-11-15 ENCOUNTER — Other Ambulatory Visit: Payer: Self-pay

## 2023-11-15 ENCOUNTER — Emergency Department (HOSPITAL_COMMUNITY)

## 2023-11-15 DIAGNOSIS — J101 Influenza due to other identified influenza virus with other respiratory manifestations: Secondary | ICD-10-CM | POA: Diagnosis not present

## 2023-11-15 DIAGNOSIS — M25561 Pain in right knee: Secondary | ICD-10-CM | POA: Insufficient documentation

## 2023-11-15 DIAGNOSIS — J111 Influenza due to unidentified influenza virus with other respiratory manifestations: Secondary | ICD-10-CM

## 2023-11-15 DIAGNOSIS — R519 Headache, unspecified: Secondary | ICD-10-CM | POA: Diagnosis present

## 2023-11-15 LAB — RESP PANEL BY RT-PCR (RSV, FLU A&B, COVID)  RVPGX2
Influenza A by PCR: POSITIVE — AB
Influenza B by PCR: NEGATIVE
Resp Syncytial Virus by PCR: NEGATIVE
SARS Coronavirus 2 by RT PCR: NEGATIVE

## 2023-11-15 MED ORDER — OSELTAMIVIR PHOSPHATE 75 MG PO CAPS: 75.0000 mg | ORAL_CAPSULE | Freq: Two times a day (BID) | ORAL | 0 refills | Status: AC

## 2023-11-15 NOTE — ED Provider Notes (Signed)
 [place] [facility]MENT AT [facility] Provider Note   CSN: [id number] Arrival date & time: 11/15/23  4098     History  Chief Complaint  Patient presents with   Headache   Leg Pain    Richard Glass is a 53 y.o. male.  Patient is a 53 year old male who presents with fatigue and muscle aches.  He has a bifrontal type headache.  He said he started feeling bad last night.  He also has some pain in his right knee.  He said he was turning over in bed and felt a pop.  He denies any injuries to the knee.  He denies any known fevers.  He has had a little bit of coughing and nasal congestion.  No vomiting.  No diarrhea.  No abdominal pain.  No urinary symptoms.  No shortness of breath.  No associated chest pain.       Home Medications Prior to Admission medications   Medication Sig Start Date End Date Taking? Authorizing Provider  oseltamivir (TAMIFLU) 75 MG capsule Take 1 capsule (75 mg total) by mouth every 12 (twelve) hours. 11/15/23  Yes Rolan Bucco, MD  Calcium Carb-Cholecalciferol ([facility] CALCIUM + D3) 600-800 MG-UNIT TABS Take 1 tablet by mouth 2 (two) times daily before a meal.    [provider]  cetirizine (ZYRTEC) 10 MG tablet Take 10 mg by mouth daily. 11/09/21   [provider]  diclofenac Sodium (VOLTAREN) 1 % GEL Apply 2 g topically 4 (four) times daily. Patient taking differently: Apply 2 g topically daily as needed (for pain). 10/10/20   Cristie Hem, PA-C  diltiazem (CARDIZEM CD) 120 MG 24 hr capsule Take 1 capsule (120 mg total) by mouth daily. 11/22/22   Lorin Glass, MD  docusate sodium (COLACE) 100 MG capsule Take 1 capsule (100 mg total) by mouth 2 (two) times daily. 11/22/22   Dahal, Melina Schools, MD  enoxaparin (LOVENOX) 40 MG/0.4ML injection Inject 0.4 mLs (40 mg total) into the skin daily for 28 doses. 11/22/22 12/20/22  Lorin Glass, MD  feeding supplement (ENSURE ENLIVE / ENSURE PLUS) LIQD Take 237 mLs by mouth daily. 11/22/22   Dahal,  Melina Schools, MD  fluticasone (FLONASE) 50 MCG/ACT nasal spray PLACE 2 SPRAYS IN EACH NOSTRIL EVERY DAY Patient taking differently: Place 2 sprays into both nostrils daily as needed for allergies. 05/04/19   [name], Leighton Roach, MD  gabapentin (NEURONTIN) 600 MG tablet Take 2 tablets (1,200 mg total) by mouth 3 (three) times daily. Patient taking differently: Take 600-1,200 mg by mouth See admin instructions. Takes 1 tablet by mouth in the morning and then take 2 tablets by mouth at bedtime. 04/06/19   [name], Leighton Roach, MD  Galcanezumab-gnlm (EMGALITY) 120 MG/ML SOAJ Inject 1 pen  into the skin every 30 (thirty) days. 08/07/23   Glean Salvo, NP  hydrOXYzine (ATARAX/VISTARIL) 25 MG tablet Take 25 mg by mouth 3 (three) times daily as needed for itching. 02/19/21   [provider]  methocarbamol (ROBAXIN) 500 MG tablet Take 1 tablet (500 mg total) by mouth every 6 (six) hours as needed for muscle spasms. 11/22/22   Lorin Glass, MD  Multiple Vitamin (MULTIVITAMIN) tablet Take 1 tablet by mouth daily.    [provider]  oxyCODONE (OXY IR/ROXICODONE) 5 MG immediate release tablet Take 1-2 tablets (5-10 mg total) by mouth every 6 (six) hours as needed for moderate pain (pain score 4-6). 11/22/22   Cristie Hem, PA-C  PHENobarbital (LUMINAL) 64.8  MG tablet Take 3 tablets (194.4 mg total) by mouth at bedtime. 10/24/23 01/16/25  Sater, Pearletha Furl, MD  polyethylene glycol (MIRALAX / GLYCOLAX) 17 g packet Take 17 g by mouth daily as needed for mild constipation. 11/22/22   Dahal, Melina Schools, MD  rizatriptan (MAXALT) 10 MG tablet TAKE 1 TABLET BY MOUTH AS NEEDED FOR MIGRAINE. MAY REPEAT IN 2 HOURS IF NEEDED 08/07/23   Glean Salvo, NP  tamsulosin (FLOMAX) 0.4 MG CAPS capsule Take 1 capsule (0.4 mg total) by mouth daily after breakfast. 11/23/22   Dahal, Melina Schools, MD  zonisamide (ZONEGRAN) 100 MG capsule Take 1 capsule (100 mg total) by mouth 2 (two) times daily. 08/07/23   Glean Salvo, NP  zonisamide (ZONEGRAN)  50 MG capsule Take 1 capsule (50 mg total) by mouth in the morning and at bedtime. Take along with the 100 mg capsule twice daily 08/07/23   Glean Salvo, NP      Allergies    Ibuprofen    Review of Systems   Review of Systems  Constitutional:  Positive for fatigue. Negative for chills, diaphoresis and fever.  HENT:  Positive for congestion and rhinorrhea. Negative for sneezing.   Eyes: Negative.   Respiratory:  Positive for cough. Negative for chest tightness and shortness of breath.   Cardiovascular:  Negative for chest pain and leg swelling.  Gastrointestinal:  Negative for abdominal pain, blood in stool, diarrhea, nausea and vomiting.  Genitourinary:  Negative for difficulty urinating, flank pain, frequency and hematuria.  Musculoskeletal:  Positive for arthralgias and myalgias. Negative for back pain.  Skin:  Negative for rash.  Neurological:  Positive for headaches. Negative for dizziness, speech difficulty, weakness and numbness.    Physical Exam Updated Vital Signs BP 106/71   Pulse (!) 101   Temp 100 F (37.8 C) (Oral)   Resp 14   SpO2 97%  Physical Exam Constitutional:      Appearance: He is well-developed.  HENT:     Head: Normocephalic and atraumatic.  Eyes:     Pupils: Pupils are equal, round, and reactive to light.  Cardiovascular:     Rate and Rhythm: Normal rate and regular rhythm.     Heart sounds: Normal heart sounds.  Pulmonary:     Effort: Pulmonary effort is normal. No respiratory distress.     Breath sounds: Normal breath sounds. No wheezing or rales.  Chest:     Chest wall: No tenderness.  Abdominal:     General: Bowel sounds are normal.     Palpations: Abdomen is soft.     Tenderness: There is no abdominal tenderness. There is no guarding or rebound.  Musculoskeletal:        General: Normal range of motion.     Cervical back: Normal range of motion and neck supple.     Comments: Mild tenderness on palpation of the anterior portion of the  right knee.  No deformity or swelling.  No swelling to the lower leg.  No calf tenderness.  Pedal pulses intact.  No warmth or erythema to the knee.  No gross ligament instability.  Lymphadenopathy:     Cervical: No cervical adenopathy.  Skin:    General: Skin is warm and dry.     Findings: No rash.  Neurological:     Mental Status: He is alert and oriented to person, place, and time.     ED Results / Procedures / Treatments   Labs (all labs ordered are listed, but only  abnormal results are displayed) Labs Reviewed  RESP PANEL BY RT-PCR (RSV, FLU A&B, COVID)  RVPGX2 - Abnormal; Notable for the following components:      Result Value   Influenza A by PCR POSITIVE (*)    All other components within normal limits    EKG None  Radiology DG Knee Complete 4 Views Right Result Date: 11/15/2023 CLINICAL DATA:  Right knee pain. EXAM: RIGHT KNEE - COMPLETE 4+ VIEW COMPARISON:  Knee radiographs dated 11/17/2022 FINDINGS: No evidence of fracture, dislocation, or joint effusion. 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 Fixation hardware is seen in the femur. Soft tissues are unremarkable. IMPRESSION: Mild tricompartmental osteoarthritis. Electronically Signed   By: Romona Curls M.D.   On: 11/15/2023 10:51   DG Chest 2 View Result Date: 11/15/2023 CLINICAL DATA:  Cough EXAM: CHEST - 2 VIEW COMPARISON:  11/18/2022 FINDINGS: The heart size and mediastinal contours are within normal limits. Both lungs are clear. The visualized skeletal structures are unremarkable. IMPRESSION: No active cardiopulmonary disease. Electronically Signed   By: Annia Belt M.D.   On: 11/15/2023 10:46    Procedures Procedures    Medications Ordered in ED Medications - No data to display  ED Course/ Medical Decision Making/ A&P                                 Medical Decision Making Amount and/or Complexity of Data Reviewed Radiology: ordered.  Risk Prescription drug management.   Patient presents with  cough, fatigue and myalgias.  He was noted to have a temperature of 100 here in the ED.  His other vital signs are stable.  He has some mild tachycardia.  Chest x-ray was interpreted by me and confirmed by the radiologist to show no evidence of pneumonia.  His influenza test is positive which is likely the etiology of his symptoms.  There is no suggestions of fluid overload.  He does not have any shortness of breath.  No increased work of breathing.  No hypoxia.  No other symptoms that would warrant hospitalization.  Discussed Tamiflu and he wants to go ahead with this.  Will send a prescription in for this.  Symptomatic care instructions were given.  X-rays of his right knee were interpreted by me and confirmed by the radiologist to show no acute abnormalities.  He does have some arthritic changes.  Ace wrap was placed in the knee.  He does not have any suggestions of infection or gross ligament damage.  He was discharged home in good condition.  Return precautions were given.  Final Clinical Impression(s) / ED Diagnoses Final diagnoses:  Influenza  Acute pain of right knee    Rx / DC Orders ED Discharge Orders          Ordered    oseltamivir (TAMIFLU) 75 MG capsule  Every 12 hours        11/15/23 1208              Rolan Bucco, MD 11/15/23 1216

## 2023-11-15 NOTE — ED Provider Triage Note (Signed)
 Emergency Medicine Provider Triage Evaluation Note  Richard Glass , a 53 y.o. male  was evaluated in triage.  Pt complains of frontal headache, right knee pain.  He says his knee popped while he was sleeping last night.  He denies any other injuries.  His temperature was noted to be 100 in triage.  He has a little bit of a cough.  No congestion.  No vomiting.  No shortness of breath.  No associated chest pain.  No abdominal pain..  Review of Systems  Positive: Right knee pain, cough, headache Negative: Vomiting, diarrhea, chest pain  Physical Exam  BP 106/71   Pulse (!) 101   Temp 100 F (37.8 C) (Oral)   Resp 14   SpO2 97%  Gen:   Awake, no distress    Resp:  Normal effort   MSK:   Moves extremities without difficulty tenderness to palpation of the right knee, no deformity or swelling, pedal pulses intact Other:     Medical Decision Making  Medically screening exam initiated at 9:57 AM.  Appropriate orders placed.  [name] was informed that the remainder of the evaluation will be completed by another provider, this initial triage assessment does not replace that evaluation, and the importance of remaining in the ED until their evaluation is complete.      Rolan Bucco, MD 11/15/23 [phone]

## 2023-11-15 NOTE — ED Triage Notes (Signed)
 Pt bib ems from home c.o headache since last night and right leg pain. Hx of mirgraines.  Pt febrile in triage, states he has been coughing since last night as well, no sick contacts

## 2024-04-29 ENCOUNTER — Other Ambulatory Visit: Payer: Self-pay | Admitting: Neurology

## 2024-04-29 DIAGNOSIS — G40909 Epilepsy, unspecified, not intractable, without status epilepticus: Secondary | ICD-10-CM

## 2024-04-29 NOTE — Telephone Encounter (Signed)
 Last seen on 08/07/23 Follow up scheduled on 08/17/24   Dispensed Days Supply Quantity Provider Pharmacy  PHENOBARBITAL 64.8 MG TABLET 03/31/2024 30 90 each Sater, Charlie LABOR, MD [facility] [phone] - [facility]...     Rx pending to be signed

## 2024-08-17 ENCOUNTER — Ambulatory Visit: Payer: Medicare HMO | Admitting: Neurology
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
